# Patient Record
Sex: Male | Born: 1945 | Race: White | Hispanic: No | Marital: Married | Smoking: Former smoker
Health system: Southern US, Community
[De-identification: ages and names within clinical notes are randomized; demographics above are authoritative.]

## PROBLEM LIST (undated history)

## (undated) DIAGNOSIS — D649 Anemia, unspecified: Secondary | ICD-10-CM

## (undated) DIAGNOSIS — J869 Pyothorax without fistula: Secondary | ICD-10-CM

## (undated) DIAGNOSIS — I499 Cardiac arrhythmia, unspecified: Secondary | ICD-10-CM

## (undated) DIAGNOSIS — Z923 Personal history of irradiation: Secondary | ICD-10-CM

## (undated) DIAGNOSIS — G709 Myoneural disorder, unspecified: Secondary | ICD-10-CM

## (undated) DIAGNOSIS — E785 Hyperlipidemia, unspecified: Secondary | ICD-10-CM

## (undated) DIAGNOSIS — J449 Chronic obstructive pulmonary disease, unspecified: Secondary | ICD-10-CM

## (undated) DIAGNOSIS — R112 Nausea with vomiting, unspecified: Secondary | ICD-10-CM

## (undated) DIAGNOSIS — I10 Essential (primary) hypertension: Secondary | ICD-10-CM

## (undated) DIAGNOSIS — C801 Malignant (primary) neoplasm, unspecified: Secondary | ICD-10-CM

## (undated) DIAGNOSIS — Z9889 Other specified postprocedural states: Secondary | ICD-10-CM

## (undated) DIAGNOSIS — A419 Sepsis, unspecified organism: Secondary | ICD-10-CM

## (undated) HISTORY — DX: Essential (primary) hypertension: I10

## (undated) HISTORY — PX: EYE SURGERY: SHX253

## (undated) HISTORY — DX: Chronic obstructive pulmonary disease, unspecified: J44.9

## (undated) HISTORY — PX: CATARACT EXTRACTION: SUR2

---

## 1988-06-05 HISTORY — PX: BACK SURGERY: SHX140

## 1993-06-05 HISTORY — PX: CERVICAL SPINE SURGERY: SHX589

## 2011-06-22 DIAGNOSIS — Z79899 Other long term (current) drug therapy: Secondary | ICD-10-CM | POA: Diagnosis not present

## 2011-07-19 DIAGNOSIS — L4050 Arthropathic psoriasis, unspecified: Secondary | ICD-10-CM | POA: Diagnosis not present

## 2011-09-19 DIAGNOSIS — E119 Type 2 diabetes mellitus without complications: Secondary | ICD-10-CM | POA: Diagnosis not present

## 2011-09-19 DIAGNOSIS — E781 Pure hyperglyceridemia: Secondary | ICD-10-CM | POA: Diagnosis not present

## 2011-09-19 DIAGNOSIS — I10 Essential (primary) hypertension: Secondary | ICD-10-CM | POA: Diagnosis not present

## 2011-09-28 DIAGNOSIS — E119 Type 2 diabetes mellitus without complications: Secondary | ICD-10-CM | POA: Diagnosis not present

## 2011-09-28 DIAGNOSIS — E781 Pure hyperglyceridemia: Secondary | ICD-10-CM | POA: Diagnosis not present

## 2011-09-28 DIAGNOSIS — E7800 Pure hypercholesterolemia, unspecified: Secondary | ICD-10-CM | POA: Diagnosis not present

## 2011-09-28 DIAGNOSIS — I10 Essential (primary) hypertension: Secondary | ICD-10-CM | POA: Diagnosis not present

## 2011-09-28 DIAGNOSIS — L408 Other psoriasis: Secondary | ICD-10-CM | POA: Diagnosis not present

## 2011-09-28 DIAGNOSIS — J438 Other emphysema: Secondary | ICD-10-CM | POA: Diagnosis not present

## 2011-09-28 DIAGNOSIS — L4050 Arthropathic psoriasis, unspecified: Secondary | ICD-10-CM | POA: Diagnosis not present

## 2011-09-29 DIAGNOSIS — J438 Other emphysema: Secondary | ICD-10-CM | POA: Diagnosis not present

## 2012-01-02 DIAGNOSIS — Z79899 Other long term (current) drug therapy: Secondary | ICD-10-CM | POA: Diagnosis not present

## 2012-01-16 DIAGNOSIS — L4050 Arthropathic psoriasis, unspecified: Secondary | ICD-10-CM | POA: Diagnosis not present

## 2012-01-22 DIAGNOSIS — Z85828 Personal history of other malignant neoplasm of skin: Secondary | ICD-10-CM | POA: Diagnosis not present

## 2012-01-22 DIAGNOSIS — L408 Other psoriasis: Secondary | ICD-10-CM | POA: Diagnosis not present

## 2012-01-22 DIAGNOSIS — L570 Actinic keratosis: Secondary | ICD-10-CM | POA: Diagnosis not present

## 2012-02-13 DIAGNOSIS — H5010 Unspecified exotropia: Secondary | ICD-10-CM | POA: Diagnosis not present

## 2012-02-13 DIAGNOSIS — H43819 Vitreous degeneration, unspecified eye: Secondary | ICD-10-CM | POA: Diagnosis not present

## 2012-02-13 DIAGNOSIS — H4320 Crystalline deposits in vitreous body, unspecified eye: Secondary | ICD-10-CM | POA: Diagnosis not present

## 2012-02-13 DIAGNOSIS — Z961 Presence of intraocular lens: Secondary | ICD-10-CM | POA: Diagnosis not present

## 2012-03-26 DIAGNOSIS — N400 Enlarged prostate without lower urinary tract symptoms: Secondary | ICD-10-CM | POA: Diagnosis not present

## 2012-03-26 DIAGNOSIS — E119 Type 2 diabetes mellitus without complications: Secondary | ICD-10-CM | POA: Diagnosis not present

## 2012-03-26 DIAGNOSIS — E781 Pure hyperglyceridemia: Secondary | ICD-10-CM | POA: Diagnosis not present

## 2012-03-26 DIAGNOSIS — I10 Essential (primary) hypertension: Secondary | ICD-10-CM | POA: Diagnosis not present

## 2012-04-01 DIAGNOSIS — L4050 Arthropathic psoriasis, unspecified: Secondary | ICD-10-CM | POA: Diagnosis not present

## 2012-04-01 DIAGNOSIS — L408 Other psoriasis: Secondary | ICD-10-CM | POA: Diagnosis not present

## 2012-04-01 DIAGNOSIS — Z23 Encounter for immunization: Secondary | ICD-10-CM | POA: Diagnosis not present

## 2012-04-01 DIAGNOSIS — J438 Other emphysema: Secondary | ICD-10-CM | POA: Diagnosis not present

## 2012-04-01 DIAGNOSIS — E7800 Pure hypercholesterolemia, unspecified: Secondary | ICD-10-CM | POA: Diagnosis not present

## 2012-04-01 DIAGNOSIS — E781 Pure hyperglyceridemia: Secondary | ICD-10-CM | POA: Diagnosis not present

## 2012-04-01 DIAGNOSIS — I10 Essential (primary) hypertension: Secondary | ICD-10-CM | POA: Diagnosis not present

## 2012-04-01 DIAGNOSIS — E119 Type 2 diabetes mellitus without complications: Secondary | ICD-10-CM | POA: Diagnosis not present

## 2012-06-25 DIAGNOSIS — E119 Type 2 diabetes mellitus without complications: Secondary | ICD-10-CM | POA: Diagnosis not present

## 2012-06-25 DIAGNOSIS — I10 Essential (primary) hypertension: Secondary | ICD-10-CM | POA: Diagnosis not present

## 2012-06-25 DIAGNOSIS — E7800 Pure hypercholesterolemia, unspecified: Secondary | ICD-10-CM | POA: Diagnosis not present

## 2012-07-24 DIAGNOSIS — L570 Actinic keratosis: Secondary | ICD-10-CM | POA: Diagnosis not present

## 2012-07-24 DIAGNOSIS — L408 Other psoriasis: Secondary | ICD-10-CM | POA: Diagnosis not present

## 2012-07-24 DIAGNOSIS — Z85828 Personal history of other malignant neoplasm of skin: Secondary | ICD-10-CM | POA: Diagnosis not present

## 2012-07-25 DIAGNOSIS — L4050 Arthropathic psoriasis, unspecified: Secondary | ICD-10-CM | POA: Diagnosis not present

## 2012-09-25 DIAGNOSIS — I10 Essential (primary) hypertension: Secondary | ICD-10-CM | POA: Diagnosis not present

## 2012-09-25 DIAGNOSIS — E7800 Pure hypercholesterolemia, unspecified: Secondary | ICD-10-CM | POA: Diagnosis not present

## 2012-09-25 DIAGNOSIS — E119 Type 2 diabetes mellitus without complications: Secondary | ICD-10-CM | POA: Diagnosis not present

## 2012-10-01 DIAGNOSIS — I10 Essential (primary) hypertension: Secondary | ICD-10-CM | POA: Diagnosis not present

## 2012-10-01 DIAGNOSIS — L408 Other psoriasis: Secondary | ICD-10-CM | POA: Diagnosis not present

## 2012-10-01 DIAGNOSIS — E781 Pure hyperglyceridemia: Secondary | ICD-10-CM | POA: Diagnosis not present

## 2012-10-01 DIAGNOSIS — L4050 Arthropathic psoriasis, unspecified: Secondary | ICD-10-CM | POA: Diagnosis not present

## 2012-10-01 DIAGNOSIS — E119 Type 2 diabetes mellitus without complications: Secondary | ICD-10-CM | POA: Diagnosis not present

## 2012-10-01 DIAGNOSIS — E7800 Pure hypercholesterolemia, unspecified: Secondary | ICD-10-CM | POA: Diagnosis not present

## 2012-10-01 DIAGNOSIS — J438 Other emphysema: Secondary | ICD-10-CM | POA: Diagnosis not present

## 2012-12-30 DIAGNOSIS — E7800 Pure hypercholesterolemia, unspecified: Secondary | ICD-10-CM | POA: Diagnosis not present

## 2012-12-30 DIAGNOSIS — E119 Type 2 diabetes mellitus without complications: Secondary | ICD-10-CM | POA: Diagnosis not present

## 2012-12-30 DIAGNOSIS — L4050 Arthropathic psoriasis, unspecified: Secondary | ICD-10-CM | POA: Diagnosis not present

## 2012-12-30 DIAGNOSIS — Z8739 Personal history of other diseases of the musculoskeletal system and connective tissue: Secondary | ICD-10-CM | POA: Diagnosis not present

## 2012-12-30 DIAGNOSIS — I10 Essential (primary) hypertension: Secondary | ICD-10-CM | POA: Diagnosis not present

## 2012-12-30 DIAGNOSIS — J438 Other emphysema: Secondary | ICD-10-CM | POA: Diagnosis not present

## 2013-01-16 DIAGNOSIS — L4050 Arthropathic psoriasis, unspecified: Secondary | ICD-10-CM | POA: Diagnosis not present

## 2013-03-28 DIAGNOSIS — E119 Type 2 diabetes mellitus without complications: Secondary | ICD-10-CM | POA: Diagnosis not present

## 2013-03-28 DIAGNOSIS — I10 Essential (primary) hypertension: Secondary | ICD-10-CM | POA: Diagnosis not present

## 2013-03-28 DIAGNOSIS — E7800 Pure hypercholesterolemia, unspecified: Secondary | ICD-10-CM | POA: Diagnosis not present

## 2013-03-28 DIAGNOSIS — J438 Other emphysema: Secondary | ICD-10-CM | POA: Diagnosis not present

## 2013-04-07 DIAGNOSIS — E781 Pure hyperglyceridemia: Secondary | ICD-10-CM | POA: Diagnosis not present

## 2013-04-07 DIAGNOSIS — Z23 Encounter for immunization: Secondary | ICD-10-CM | POA: Diagnosis not present

## 2013-04-07 DIAGNOSIS — E119 Type 2 diabetes mellitus without complications: Secondary | ICD-10-CM | POA: Diagnosis not present

## 2013-04-07 DIAGNOSIS — J438 Other emphysema: Secondary | ICD-10-CM | POA: Diagnosis not present

## 2013-04-07 DIAGNOSIS — E7800 Pure hypercholesterolemia, unspecified: Secondary | ICD-10-CM | POA: Diagnosis not present

## 2013-04-07 DIAGNOSIS — I10 Essential (primary) hypertension: Secondary | ICD-10-CM | POA: Diagnosis not present

## 2013-04-07 DIAGNOSIS — L4050 Arthropathic psoriasis, unspecified: Secondary | ICD-10-CM | POA: Diagnosis not present

## 2013-04-07 DIAGNOSIS — L408 Other psoriasis: Secondary | ICD-10-CM | POA: Diagnosis not present

## 2013-06-05 DIAGNOSIS — L4050 Arthropathic psoriasis, unspecified: Secondary | ICD-10-CM

## 2013-06-05 HISTORY — DX: Arthropathic psoriasis, unspecified: L40.50

## 2013-06-30 DIAGNOSIS — J438 Other emphysema: Secondary | ICD-10-CM | POA: Diagnosis not present

## 2013-06-30 DIAGNOSIS — E7800 Pure hypercholesterolemia, unspecified: Secondary | ICD-10-CM | POA: Diagnosis not present

## 2013-06-30 DIAGNOSIS — I10 Essential (primary) hypertension: Secondary | ICD-10-CM | POA: Diagnosis not present

## 2013-06-30 DIAGNOSIS — E119 Type 2 diabetes mellitus without complications: Secondary | ICD-10-CM | POA: Diagnosis not present

## 2013-07-17 DIAGNOSIS — L4050 Arthropathic psoriasis, unspecified: Secondary | ICD-10-CM | POA: Diagnosis not present

## 2013-08-06 DIAGNOSIS — I10 Essential (primary) hypertension: Secondary | ICD-10-CM | POA: Diagnosis not present

## 2013-08-06 DIAGNOSIS — E7800 Pure hypercholesterolemia, unspecified: Secondary | ICD-10-CM | POA: Diagnosis not present

## 2013-08-06 DIAGNOSIS — E119 Type 2 diabetes mellitus without complications: Secondary | ICD-10-CM | POA: Diagnosis not present

## 2013-08-22 DIAGNOSIS — L408 Other psoriasis: Secondary | ICD-10-CM | POA: Diagnosis not present

## 2013-08-22 DIAGNOSIS — E119 Type 2 diabetes mellitus without complications: Secondary | ICD-10-CM | POA: Diagnosis not present

## 2013-08-22 DIAGNOSIS — L4050 Arthropathic psoriasis, unspecified: Secondary | ICD-10-CM | POA: Diagnosis not present

## 2013-08-22 DIAGNOSIS — I10 Essential (primary) hypertension: Secondary | ICD-10-CM | POA: Diagnosis not present

## 2013-08-22 DIAGNOSIS — Z23 Encounter for immunization: Secondary | ICD-10-CM | POA: Diagnosis not present

## 2013-08-22 DIAGNOSIS — J438 Other emphysema: Secondary | ICD-10-CM | POA: Diagnosis not present

## 2013-08-22 DIAGNOSIS — E781 Pure hyperglyceridemia: Secondary | ICD-10-CM | POA: Diagnosis not present

## 2013-08-22 DIAGNOSIS — E7800 Pure hypercholesterolemia, unspecified: Secondary | ICD-10-CM | POA: Diagnosis not present

## 2013-11-28 DIAGNOSIS — H524 Presbyopia: Secondary | ICD-10-CM | POA: Diagnosis not present

## 2013-11-28 DIAGNOSIS — Z961 Presence of intraocular lens: Secondary | ICD-10-CM | POA: Diagnosis not present

## 2013-12-09 DIAGNOSIS — J019 Acute sinusitis, unspecified: Secondary | ICD-10-CM | POA: Diagnosis not present

## 2013-12-22 DIAGNOSIS — E7800 Pure hypercholesterolemia, unspecified: Secondary | ICD-10-CM | POA: Diagnosis not present

## 2013-12-22 DIAGNOSIS — I10 Essential (primary) hypertension: Secondary | ICD-10-CM | POA: Diagnosis not present

## 2013-12-22 DIAGNOSIS — E119 Type 2 diabetes mellitus without complications: Secondary | ICD-10-CM | POA: Diagnosis not present

## 2014-01-14 DIAGNOSIS — L4050 Arthropathic psoriasis, unspecified: Secondary | ICD-10-CM | POA: Diagnosis not present

## 2014-03-06 DIAGNOSIS — R6884 Jaw pain: Secondary | ICD-10-CM | POA: Diagnosis not present

## 2014-03-06 DIAGNOSIS — K047 Periapical abscess without sinus: Secondary | ICD-10-CM | POA: Diagnosis not present

## 2014-03-09 DIAGNOSIS — E780 Pure hypercholesterolemia: Secondary | ICD-10-CM | POA: Diagnosis not present

## 2014-03-09 DIAGNOSIS — E119 Type 2 diabetes mellitus without complications: Secondary | ICD-10-CM | POA: Diagnosis not present

## 2014-03-09 DIAGNOSIS — I10 Essential (primary) hypertension: Secondary | ICD-10-CM | POA: Diagnosis not present

## 2014-03-13 DIAGNOSIS — E119 Type 2 diabetes mellitus without complications: Secondary | ICD-10-CM | POA: Diagnosis not present

## 2014-03-13 DIAGNOSIS — L4050 Arthropathic psoriasis, unspecified: Secondary | ICD-10-CM | POA: Diagnosis not present

## 2014-03-13 DIAGNOSIS — Z23 Encounter for immunization: Secondary | ICD-10-CM | POA: Diagnosis not present

## 2014-03-13 DIAGNOSIS — I10 Essential (primary) hypertension: Secondary | ICD-10-CM | POA: Diagnosis not present

## 2014-03-13 DIAGNOSIS — E782 Mixed hyperlipidemia: Secondary | ICD-10-CM | POA: Diagnosis not present

## 2014-03-13 DIAGNOSIS — J439 Emphysema, unspecified: Secondary | ICD-10-CM | POA: Diagnosis not present

## 2014-03-13 DIAGNOSIS — L408 Other psoriasis: Secondary | ICD-10-CM | POA: Diagnosis not present

## 2014-07-06 DIAGNOSIS — E119 Type 2 diabetes mellitus without complications: Secondary | ICD-10-CM | POA: Diagnosis not present

## 2014-07-06 DIAGNOSIS — E782 Mixed hyperlipidemia: Secondary | ICD-10-CM | POA: Diagnosis not present

## 2014-07-06 DIAGNOSIS — I10 Essential (primary) hypertension: Secondary | ICD-10-CM | POA: Diagnosis not present

## 2014-07-13 DIAGNOSIS — L4050 Arthropathic psoriasis, unspecified: Secondary | ICD-10-CM | POA: Diagnosis not present

## 2014-07-13 DIAGNOSIS — J439 Emphysema, unspecified: Secondary | ICD-10-CM | POA: Diagnosis not present

## 2014-07-13 DIAGNOSIS — E782 Mixed hyperlipidemia: Secondary | ICD-10-CM | POA: Diagnosis not present

## 2014-07-13 DIAGNOSIS — I10 Essential (primary) hypertension: Secondary | ICD-10-CM | POA: Diagnosis not present

## 2014-07-13 DIAGNOSIS — L408 Other psoriasis: Secondary | ICD-10-CM | POA: Diagnosis not present

## 2014-07-13 DIAGNOSIS — G4701 Insomnia due to medical condition: Secondary | ICD-10-CM | POA: Diagnosis not present

## 2014-07-13 DIAGNOSIS — E119 Type 2 diabetes mellitus without complications: Secondary | ICD-10-CM | POA: Diagnosis not present

## 2014-07-13 DIAGNOSIS — Z1389 Encounter for screening for other disorder: Secondary | ICD-10-CM | POA: Diagnosis not present

## 2014-07-24 DIAGNOSIS — L4050 Arthropathic psoriasis, unspecified: Secondary | ICD-10-CM | POA: Diagnosis not present

## 2014-07-24 DIAGNOSIS — R21 Rash and other nonspecific skin eruption: Secondary | ICD-10-CM | POA: Diagnosis not present

## 2014-07-24 DIAGNOSIS — M542 Cervicalgia: Secondary | ICD-10-CM | POA: Diagnosis not present

## 2014-08-05 DIAGNOSIS — L719 Rosacea, unspecified: Secondary | ICD-10-CM | POA: Diagnosis not present

## 2014-08-05 DIAGNOSIS — Z85828 Personal history of other malignant neoplasm of skin: Secondary | ICD-10-CM | POA: Diagnosis not present

## 2014-09-08 DIAGNOSIS — L719 Rosacea, unspecified: Secondary | ICD-10-CM | POA: Diagnosis not present

## 2014-11-17 DIAGNOSIS — E782 Mixed hyperlipidemia: Secondary | ICD-10-CM | POA: Diagnosis not present

## 2014-11-17 DIAGNOSIS — E119 Type 2 diabetes mellitus without complications: Secondary | ICD-10-CM | POA: Diagnosis not present

## 2014-11-17 DIAGNOSIS — I10 Essential (primary) hypertension: Secondary | ICD-10-CM | POA: Diagnosis not present

## 2014-11-24 DIAGNOSIS — E782 Mixed hyperlipidemia: Secondary | ICD-10-CM | POA: Diagnosis not present

## 2014-11-24 DIAGNOSIS — E119 Type 2 diabetes mellitus without complications: Secondary | ICD-10-CM | POA: Diagnosis not present

## 2014-11-24 DIAGNOSIS — G4701 Insomnia due to medical condition: Secondary | ICD-10-CM | POA: Diagnosis not present

## 2014-11-24 DIAGNOSIS — I10 Essential (primary) hypertension: Secondary | ICD-10-CM | POA: Diagnosis not present

## 2014-11-24 DIAGNOSIS — J439 Emphysema, unspecified: Secondary | ICD-10-CM | POA: Diagnosis not present

## 2014-11-24 DIAGNOSIS — L408 Other psoriasis: Secondary | ICD-10-CM | POA: Diagnosis not present

## 2014-11-24 DIAGNOSIS — L4050 Arthropathic psoriasis, unspecified: Secondary | ICD-10-CM | POA: Diagnosis not present

## 2014-12-16 DIAGNOSIS — Z85828 Personal history of other malignant neoplasm of skin: Secondary | ICD-10-CM | POA: Diagnosis not present

## 2014-12-16 DIAGNOSIS — L400 Psoriasis vulgaris: Secondary | ICD-10-CM | POA: Diagnosis not present

## 2014-12-16 DIAGNOSIS — L570 Actinic keratosis: Secondary | ICD-10-CM | POA: Diagnosis not present

## 2015-01-05 DIAGNOSIS — I10 Essential (primary) hypertension: Secondary | ICD-10-CM | POA: Diagnosis not present

## 2015-01-05 DIAGNOSIS — L4050 Arthropathic psoriasis, unspecified: Secondary | ICD-10-CM | POA: Diagnosis not present

## 2015-01-05 DIAGNOSIS — E119 Type 2 diabetes mellitus without complications: Secondary | ICD-10-CM | POA: Diagnosis not present

## 2015-01-05 DIAGNOSIS — L408 Other psoriasis: Secondary | ICD-10-CM | POA: Diagnosis not present

## 2015-02-12 DIAGNOSIS — M19041 Primary osteoarthritis, right hand: Secondary | ICD-10-CM | POA: Diagnosis not present

## 2015-02-12 DIAGNOSIS — Z79899 Other long term (current) drug therapy: Secondary | ICD-10-CM | POA: Diagnosis not present

## 2015-02-12 DIAGNOSIS — M19042 Primary osteoarthritis, left hand: Secondary | ICD-10-CM | POA: Diagnosis not present

## 2015-02-12 DIAGNOSIS — L4050 Arthropathic psoriasis, unspecified: Secondary | ICD-10-CM | POA: Diagnosis not present

## 2015-02-12 DIAGNOSIS — M542 Cervicalgia: Secondary | ICD-10-CM | POA: Diagnosis not present

## 2015-03-18 DIAGNOSIS — E119 Type 2 diabetes mellitus without complications: Secondary | ICD-10-CM | POA: Diagnosis not present

## 2015-03-18 DIAGNOSIS — I10 Essential (primary) hypertension: Secondary | ICD-10-CM | POA: Diagnosis not present

## 2015-03-18 DIAGNOSIS — E782 Mixed hyperlipidemia: Secondary | ICD-10-CM | POA: Diagnosis not present

## 2015-03-19 DIAGNOSIS — E782 Mixed hyperlipidemia: Secondary | ICD-10-CM | POA: Diagnosis not present

## 2015-03-24 DIAGNOSIS — Z23 Encounter for immunization: Secondary | ICD-10-CM | POA: Diagnosis not present

## 2015-03-24 DIAGNOSIS — E119 Type 2 diabetes mellitus without complications: Secondary | ICD-10-CM | POA: Diagnosis not present

## 2015-03-24 DIAGNOSIS — L408 Other psoriasis: Secondary | ICD-10-CM | POA: Diagnosis not present

## 2015-03-24 DIAGNOSIS — J439 Emphysema, unspecified: Secondary | ICD-10-CM | POA: Diagnosis not present

## 2015-03-24 DIAGNOSIS — G4701 Insomnia due to medical condition: Secondary | ICD-10-CM | POA: Diagnosis not present

## 2015-03-24 DIAGNOSIS — I10 Essential (primary) hypertension: Secondary | ICD-10-CM | POA: Diagnosis not present

## 2015-03-24 DIAGNOSIS — E782 Mixed hyperlipidemia: Secondary | ICD-10-CM | POA: Diagnosis not present

## 2015-03-24 DIAGNOSIS — L4050 Arthropathic psoriasis, unspecified: Secondary | ICD-10-CM | POA: Diagnosis not present

## 2015-05-04 DIAGNOSIS — E782 Mixed hyperlipidemia: Secondary | ICD-10-CM | POA: Diagnosis not present

## 2015-05-04 DIAGNOSIS — I10 Essential (primary) hypertension: Secondary | ICD-10-CM | POA: Diagnosis not present

## 2015-05-04 DIAGNOSIS — E119 Type 2 diabetes mellitus without complications: Secondary | ICD-10-CM | POA: Diagnosis not present

## 2015-05-14 DIAGNOSIS — M542 Cervicalgia: Secondary | ICD-10-CM | POA: Diagnosis not present

## 2015-05-14 DIAGNOSIS — R21 Rash and other nonspecific skin eruption: Secondary | ICD-10-CM | POA: Diagnosis not present

## 2015-05-14 DIAGNOSIS — L4050 Arthropathic psoriasis, unspecified: Secondary | ICD-10-CM | POA: Diagnosis not present

## 2015-06-16 DIAGNOSIS — H524 Presbyopia: Secondary | ICD-10-CM | POA: Diagnosis not present

## 2015-06-16 DIAGNOSIS — E119 Type 2 diabetes mellitus without complications: Secondary | ICD-10-CM | POA: Diagnosis not present

## 2015-06-16 DIAGNOSIS — I10 Essential (primary) hypertension: Secondary | ICD-10-CM | POA: Diagnosis not present

## 2015-06-22 DIAGNOSIS — Z85828 Personal history of other malignant neoplasm of skin: Secondary | ICD-10-CM | POA: Diagnosis not present

## 2015-06-22 DIAGNOSIS — L400 Psoriasis vulgaris: Secondary | ICD-10-CM | POA: Diagnosis not present

## 2015-06-22 DIAGNOSIS — L570 Actinic keratosis: Secondary | ICD-10-CM | POA: Diagnosis not present

## 2015-07-23 DIAGNOSIS — E782 Mixed hyperlipidemia: Secondary | ICD-10-CM | POA: Diagnosis not present

## 2015-07-23 DIAGNOSIS — E119 Type 2 diabetes mellitus without complications: Secondary | ICD-10-CM | POA: Diagnosis not present

## 2015-07-23 DIAGNOSIS — I10 Essential (primary) hypertension: Secondary | ICD-10-CM | POA: Diagnosis not present

## 2015-07-23 DIAGNOSIS — L4050 Arthropathic psoriasis, unspecified: Secondary | ICD-10-CM | POA: Diagnosis not present

## 2015-07-23 DIAGNOSIS — E7800 Pure hypercholesterolemia, unspecified: Secondary | ICD-10-CM | POA: Diagnosis not present

## 2015-07-30 DIAGNOSIS — G4701 Insomnia due to medical condition: Secondary | ICD-10-CM | POA: Diagnosis not present

## 2015-07-30 DIAGNOSIS — E119 Type 2 diabetes mellitus without complications: Secondary | ICD-10-CM | POA: Diagnosis not present

## 2015-07-30 DIAGNOSIS — L4050 Arthropathic psoriasis, unspecified: Secondary | ICD-10-CM | POA: Diagnosis not present

## 2015-07-30 DIAGNOSIS — J439 Emphysema, unspecified: Secondary | ICD-10-CM | POA: Diagnosis not present

## 2015-07-30 DIAGNOSIS — I10 Essential (primary) hypertension: Secondary | ICD-10-CM | POA: Diagnosis not present

## 2015-07-30 DIAGNOSIS — L408 Other psoriasis: Secondary | ICD-10-CM | POA: Diagnosis not present

## 2015-07-30 DIAGNOSIS — E782 Mixed hyperlipidemia: Secondary | ICD-10-CM | POA: Diagnosis not present

## 2015-11-10 DIAGNOSIS — E782 Mixed hyperlipidemia: Secondary | ICD-10-CM | POA: Diagnosis not present

## 2015-11-10 DIAGNOSIS — I10 Essential (primary) hypertension: Secondary | ICD-10-CM | POA: Diagnosis not present

## 2015-11-10 DIAGNOSIS — E7800 Pure hypercholesterolemia, unspecified: Secondary | ICD-10-CM | POA: Diagnosis not present

## 2015-11-10 DIAGNOSIS — E119 Type 2 diabetes mellitus without complications: Secondary | ICD-10-CM | POA: Diagnosis not present

## 2015-11-12 DIAGNOSIS — Z79899 Other long term (current) drug therapy: Secondary | ICD-10-CM | POA: Diagnosis not present

## 2015-11-12 DIAGNOSIS — M542 Cervicalgia: Secondary | ICD-10-CM | POA: Diagnosis not present

## 2015-11-12 DIAGNOSIS — L4050 Arthropathic psoriasis, unspecified: Secondary | ICD-10-CM | POA: Diagnosis not present

## 2015-11-12 DIAGNOSIS — R21 Rash and other nonspecific skin eruption: Secondary | ICD-10-CM | POA: Diagnosis not present

## 2015-11-15 DIAGNOSIS — I10 Essential (primary) hypertension: Secondary | ICD-10-CM | POA: Diagnosis not present

## 2015-11-15 DIAGNOSIS — G4701 Insomnia due to medical condition: Secondary | ICD-10-CM | POA: Diagnosis not present

## 2015-11-15 DIAGNOSIS — J439 Emphysema, unspecified: Secondary | ICD-10-CM | POA: Diagnosis not present

## 2015-11-15 DIAGNOSIS — L408 Other psoriasis: Secondary | ICD-10-CM | POA: Diagnosis not present

## 2015-11-15 DIAGNOSIS — L4050 Arthropathic psoriasis, unspecified: Secondary | ICD-10-CM | POA: Diagnosis not present

## 2015-11-15 DIAGNOSIS — E782 Mixed hyperlipidemia: Secondary | ICD-10-CM | POA: Diagnosis not present

## 2015-11-15 DIAGNOSIS — E1165 Type 2 diabetes mellitus with hyperglycemia: Secondary | ICD-10-CM | POA: Diagnosis not present

## 2015-12-21 DIAGNOSIS — L570 Actinic keratosis: Secondary | ICD-10-CM | POA: Diagnosis not present

## 2015-12-21 DIAGNOSIS — L719 Rosacea, unspecified: Secondary | ICD-10-CM | POA: Diagnosis not present

## 2015-12-21 DIAGNOSIS — Z85828 Personal history of other malignant neoplasm of skin: Secondary | ICD-10-CM | POA: Diagnosis not present

## 2015-12-21 DIAGNOSIS — L817 Pigmented purpuric dermatosis: Secondary | ICD-10-CM | POA: Diagnosis not present

## 2016-01-17 DIAGNOSIS — R21 Rash and other nonspecific skin eruption: Secondary | ICD-10-CM | POA: Diagnosis not present

## 2016-01-17 DIAGNOSIS — L299 Pruritus, unspecified: Secondary | ICD-10-CM | POA: Diagnosis not present

## 2016-01-17 DIAGNOSIS — Z6829 Body mass index (BMI) 29.0-29.9, adult: Secondary | ICD-10-CM | POA: Diagnosis not present

## 2016-01-24 DIAGNOSIS — L500 Allergic urticaria: Secondary | ICD-10-CM | POA: Diagnosis not present

## 2016-01-24 DIAGNOSIS — D485 Neoplasm of uncertain behavior of skin: Secondary | ICD-10-CM | POA: Diagnosis not present

## 2016-01-24 DIAGNOSIS — L509 Urticaria, unspecified: Secondary | ICD-10-CM | POA: Diagnosis not present

## 2016-01-31 ENCOUNTER — Other Ambulatory Visit: Payer: Self-pay

## 2016-03-16 DIAGNOSIS — J439 Emphysema, unspecified: Secondary | ICD-10-CM | POA: Diagnosis not present

## 2016-03-16 DIAGNOSIS — I10 Essential (primary) hypertension: Secondary | ICD-10-CM | POA: Diagnosis not present

## 2016-03-16 DIAGNOSIS — E7800 Pure hypercholesterolemia, unspecified: Secondary | ICD-10-CM | POA: Diagnosis not present

## 2016-03-16 DIAGNOSIS — L299 Pruritus, unspecified: Secondary | ICD-10-CM | POA: Diagnosis not present

## 2016-03-16 DIAGNOSIS — E782 Mixed hyperlipidemia: Secondary | ICD-10-CM | POA: Diagnosis not present

## 2016-03-16 DIAGNOSIS — E1165 Type 2 diabetes mellitus with hyperglycemia: Secondary | ICD-10-CM | POA: Diagnosis not present

## 2016-03-20 DIAGNOSIS — L408 Other psoriasis: Secondary | ICD-10-CM | POA: Diagnosis not present

## 2016-03-20 DIAGNOSIS — I10 Essential (primary) hypertension: Secondary | ICD-10-CM | POA: Diagnosis not present

## 2016-03-20 DIAGNOSIS — J439 Emphysema, unspecified: Secondary | ICD-10-CM | POA: Diagnosis not present

## 2016-03-20 DIAGNOSIS — L4050 Arthropathic psoriasis, unspecified: Secondary | ICD-10-CM | POA: Diagnosis not present

## 2016-03-20 DIAGNOSIS — Z6829 Body mass index (BMI) 29.0-29.9, adult: Secondary | ICD-10-CM | POA: Diagnosis not present

## 2016-03-20 DIAGNOSIS — E1165 Type 2 diabetes mellitus with hyperglycemia: Secondary | ICD-10-CM | POA: Diagnosis not present

## 2016-03-20 DIAGNOSIS — E782 Mixed hyperlipidemia: Secondary | ICD-10-CM | POA: Diagnosis not present

## 2016-03-20 DIAGNOSIS — Z23 Encounter for immunization: Secondary | ICD-10-CM | POA: Diagnosis not present

## 2016-04-29 DIAGNOSIS — J069 Acute upper respiratory infection, unspecified: Secondary | ICD-10-CM | POA: Diagnosis not present

## 2016-06-02 DIAGNOSIS — Z79899 Other long term (current) drug therapy: Secondary | ICD-10-CM | POA: Diagnosis not present

## 2016-06-02 DIAGNOSIS — R21 Rash and other nonspecific skin eruption: Secondary | ICD-10-CM | POA: Diagnosis not present

## 2016-06-02 DIAGNOSIS — M542 Cervicalgia: Secondary | ICD-10-CM | POA: Diagnosis not present

## 2016-06-02 DIAGNOSIS — L4050 Arthropathic psoriasis, unspecified: Secondary | ICD-10-CM | POA: Diagnosis not present

## 2016-06-05 DIAGNOSIS — E119 Type 2 diabetes mellitus without complications: Secondary | ICD-10-CM

## 2016-06-05 HISTORY — DX: Type 2 diabetes mellitus without complications: E11.9

## 2016-06-16 DIAGNOSIS — J209 Acute bronchitis, unspecified: Secondary | ICD-10-CM | POA: Diagnosis not present

## 2016-06-16 DIAGNOSIS — Z6829 Body mass index (BMI) 29.0-29.9, adult: Secondary | ICD-10-CM | POA: Diagnosis not present

## 2016-06-16 DIAGNOSIS — J439 Emphysema, unspecified: Secondary | ICD-10-CM | POA: Diagnosis not present

## 2016-06-20 DIAGNOSIS — L570 Actinic keratosis: Secondary | ICD-10-CM | POA: Diagnosis not present

## 2016-06-20 DIAGNOSIS — L400 Psoriasis vulgaris: Secondary | ICD-10-CM | POA: Diagnosis not present

## 2016-06-20 DIAGNOSIS — Z85828 Personal history of other malignant neoplasm of skin: Secondary | ICD-10-CM | POA: Diagnosis not present

## 2016-07-18 DIAGNOSIS — E782 Mixed hyperlipidemia: Secondary | ICD-10-CM | POA: Diagnosis not present

## 2016-07-18 DIAGNOSIS — E1165 Type 2 diabetes mellitus with hyperglycemia: Secondary | ICD-10-CM | POA: Diagnosis not present

## 2016-07-18 DIAGNOSIS — I10 Essential (primary) hypertension: Secondary | ICD-10-CM | POA: Diagnosis not present

## 2016-07-18 DIAGNOSIS — E7800 Pure hypercholesterolemia, unspecified: Secondary | ICD-10-CM | POA: Diagnosis not present

## 2016-07-18 DIAGNOSIS — J439 Emphysema, unspecified: Secondary | ICD-10-CM | POA: Diagnosis not present

## 2016-07-21 DIAGNOSIS — Z6828 Body mass index (BMI) 28.0-28.9, adult: Secondary | ICD-10-CM | POA: Diagnosis not present

## 2016-07-21 DIAGNOSIS — I10 Essential (primary) hypertension: Secondary | ICD-10-CM | POA: Diagnosis not present

## 2016-07-21 DIAGNOSIS — L4050 Arthropathic psoriasis, unspecified: Secondary | ICD-10-CM | POA: Diagnosis not present

## 2016-07-21 DIAGNOSIS — E782 Mixed hyperlipidemia: Secondary | ICD-10-CM | POA: Diagnosis not present

## 2016-07-21 DIAGNOSIS — L408 Other psoriasis: Secondary | ICD-10-CM | POA: Diagnosis not present

## 2016-07-21 DIAGNOSIS — G4701 Insomnia due to medical condition: Secondary | ICD-10-CM | POA: Diagnosis not present

## 2016-07-21 DIAGNOSIS — J439 Emphysema, unspecified: Secondary | ICD-10-CM | POA: Diagnosis not present

## 2016-07-21 DIAGNOSIS — E1165 Type 2 diabetes mellitus with hyperglycemia: Secondary | ICD-10-CM | POA: Diagnosis not present

## 2016-11-21 DIAGNOSIS — L718 Other rosacea: Secondary | ICD-10-CM | POA: Diagnosis not present

## 2016-11-21 DIAGNOSIS — Z7984 Long term (current) use of oral hypoglycemic drugs: Secondary | ICD-10-CM | POA: Diagnosis not present

## 2016-11-21 DIAGNOSIS — E119 Type 2 diabetes mellitus without complications: Secondary | ICD-10-CM | POA: Diagnosis not present

## 2016-11-21 DIAGNOSIS — H4323 Crystalline deposits in vitreous body, bilateral: Secondary | ICD-10-CM | POA: Diagnosis not present

## 2016-11-27 DIAGNOSIS — M542 Cervicalgia: Secondary | ICD-10-CM | POA: Diagnosis not present

## 2016-11-27 DIAGNOSIS — R21 Rash and other nonspecific skin eruption: Secondary | ICD-10-CM | POA: Diagnosis not present

## 2016-11-27 DIAGNOSIS — L4050 Arthropathic psoriasis, unspecified: Secondary | ICD-10-CM | POA: Diagnosis not present

## 2016-11-27 DIAGNOSIS — Z79899 Other long term (current) drug therapy: Secondary | ICD-10-CM | POA: Diagnosis not present

## 2016-12-04 DIAGNOSIS — E782 Mixed hyperlipidemia: Secondary | ICD-10-CM | POA: Diagnosis not present

## 2016-12-04 DIAGNOSIS — E1165 Type 2 diabetes mellitus with hyperglycemia: Secondary | ICD-10-CM | POA: Diagnosis not present

## 2016-12-04 DIAGNOSIS — E7800 Pure hypercholesterolemia, unspecified: Secondary | ICD-10-CM | POA: Diagnosis not present

## 2016-12-04 DIAGNOSIS — Z6827 Body mass index (BMI) 27.0-27.9, adult: Secondary | ICD-10-CM | POA: Diagnosis not present

## 2016-12-04 DIAGNOSIS — J439 Emphysema, unspecified: Secondary | ICD-10-CM | POA: Diagnosis not present

## 2016-12-04 DIAGNOSIS — I10 Essential (primary) hypertension: Secondary | ICD-10-CM | POA: Diagnosis not present

## 2016-12-04 DIAGNOSIS — L4050 Arthropathic psoriasis, unspecified: Secondary | ICD-10-CM | POA: Diagnosis not present

## 2016-12-04 DIAGNOSIS — Z0000 Encounter for general adult medical examination without abnormal findings: Secondary | ICD-10-CM | POA: Diagnosis not present

## 2016-12-07 DIAGNOSIS — J439 Emphysema, unspecified: Secondary | ICD-10-CM | POA: Diagnosis not present

## 2016-12-07 DIAGNOSIS — I10 Essential (primary) hypertension: Secondary | ICD-10-CM | POA: Diagnosis not present

## 2016-12-07 DIAGNOSIS — G4701 Insomnia due to medical condition: Secondary | ICD-10-CM | POA: Diagnosis not present

## 2016-12-07 DIAGNOSIS — E782 Mixed hyperlipidemia: Secondary | ICD-10-CM | POA: Diagnosis not present

## 2016-12-07 DIAGNOSIS — L408 Other psoriasis: Secondary | ICD-10-CM | POA: Diagnosis not present

## 2016-12-07 DIAGNOSIS — L4050 Arthropathic psoriasis, unspecified: Secondary | ICD-10-CM | POA: Diagnosis not present

## 2016-12-07 DIAGNOSIS — E1165 Type 2 diabetes mellitus with hyperglycemia: Secondary | ICD-10-CM | POA: Diagnosis not present

## 2016-12-07 DIAGNOSIS — Z6828 Body mass index (BMI) 28.0-28.9, adult: Secondary | ICD-10-CM | POA: Diagnosis not present

## 2016-12-18 DIAGNOSIS — L400 Psoriasis vulgaris: Secondary | ICD-10-CM | POA: Diagnosis not present

## 2016-12-18 DIAGNOSIS — L570 Actinic keratosis: Secondary | ICD-10-CM | POA: Diagnosis not present

## 2016-12-18 DIAGNOSIS — Z85828 Personal history of other malignant neoplasm of skin: Secondary | ICD-10-CM | POA: Diagnosis not present

## 2017-04-17 DIAGNOSIS — L4050 Arthropathic psoriasis, unspecified: Secondary | ICD-10-CM | POA: Diagnosis not present

## 2017-04-17 DIAGNOSIS — E7800 Pure hypercholesterolemia, unspecified: Secondary | ICD-10-CM | POA: Diagnosis not present

## 2017-04-17 DIAGNOSIS — E782 Mixed hyperlipidemia: Secondary | ICD-10-CM | POA: Diagnosis not present

## 2017-04-17 DIAGNOSIS — I10 Essential (primary) hypertension: Secondary | ICD-10-CM | POA: Diagnosis not present

## 2017-04-17 DIAGNOSIS — E1165 Type 2 diabetes mellitus with hyperglycemia: Secondary | ICD-10-CM | POA: Diagnosis not present

## 2017-04-19 DIAGNOSIS — E782 Mixed hyperlipidemia: Secondary | ICD-10-CM | POA: Diagnosis not present

## 2017-04-19 DIAGNOSIS — L4050 Arthropathic psoriasis, unspecified: Secondary | ICD-10-CM | POA: Diagnosis not present

## 2017-04-19 DIAGNOSIS — E1165 Type 2 diabetes mellitus with hyperglycemia: Secondary | ICD-10-CM | POA: Diagnosis not present

## 2017-04-19 DIAGNOSIS — Z6828 Body mass index (BMI) 28.0-28.9, adult: Secondary | ICD-10-CM | POA: Diagnosis not present

## 2017-04-19 DIAGNOSIS — I10 Essential (primary) hypertension: Secondary | ICD-10-CM | POA: Diagnosis not present

## 2017-04-19 DIAGNOSIS — G4701 Insomnia due to medical condition: Secondary | ICD-10-CM | POA: Diagnosis not present

## 2017-04-19 DIAGNOSIS — Z23 Encounter for immunization: Secondary | ICD-10-CM | POA: Diagnosis not present

## 2017-04-19 DIAGNOSIS — J439 Emphysema, unspecified: Secondary | ICD-10-CM | POA: Diagnosis not present

## 2017-05-21 DIAGNOSIS — L723 Sebaceous cyst: Secondary | ICD-10-CM | POA: Diagnosis not present

## 2017-05-22 DIAGNOSIS — L4050 Arthropathic psoriasis, unspecified: Secondary | ICD-10-CM | POA: Diagnosis not present

## 2017-05-22 DIAGNOSIS — J449 Chronic obstructive pulmonary disease, unspecified: Secondary | ICD-10-CM | POA: Diagnosis not present

## 2017-05-22 DIAGNOSIS — Z79899 Other long term (current) drug therapy: Secondary | ICD-10-CM | POA: Diagnosis not present

## 2017-06-20 DIAGNOSIS — L400 Psoriasis vulgaris: Secondary | ICD-10-CM | POA: Diagnosis not present

## 2017-06-20 DIAGNOSIS — L570 Actinic keratosis: Secondary | ICD-10-CM | POA: Diagnosis not present

## 2017-06-20 DIAGNOSIS — Z85828 Personal history of other malignant neoplasm of skin: Secondary | ICD-10-CM | POA: Diagnosis not present

## 2017-07-05 DIAGNOSIS — L720 Epidermal cyst: Secondary | ICD-10-CM | POA: Diagnosis not present

## 2017-07-05 DIAGNOSIS — D485 Neoplasm of uncertain behavior of skin: Secondary | ICD-10-CM | POA: Diagnosis not present

## 2017-08-20 DIAGNOSIS — E782 Mixed hyperlipidemia: Secondary | ICD-10-CM | POA: Diagnosis not present

## 2017-08-20 DIAGNOSIS — I10 Essential (primary) hypertension: Secondary | ICD-10-CM | POA: Diagnosis not present

## 2017-08-20 DIAGNOSIS — E7800 Pure hypercholesterolemia, unspecified: Secondary | ICD-10-CM | POA: Diagnosis not present

## 2017-08-20 DIAGNOSIS — E1165 Type 2 diabetes mellitus with hyperglycemia: Secondary | ICD-10-CM | POA: Diagnosis not present

## 2017-08-24 DIAGNOSIS — L408 Other psoriasis: Secondary | ICD-10-CM | POA: Diagnosis not present

## 2017-08-24 DIAGNOSIS — J439 Emphysema, unspecified: Secondary | ICD-10-CM | POA: Diagnosis not present

## 2017-08-24 DIAGNOSIS — G4701 Insomnia due to medical condition: Secondary | ICD-10-CM | POA: Diagnosis not present

## 2017-08-24 DIAGNOSIS — Z6828 Body mass index (BMI) 28.0-28.9, adult: Secondary | ICD-10-CM | POA: Diagnosis not present

## 2017-08-24 DIAGNOSIS — L4050 Arthropathic psoriasis, unspecified: Secondary | ICD-10-CM | POA: Diagnosis not present

## 2017-08-24 DIAGNOSIS — E782 Mixed hyperlipidemia: Secondary | ICD-10-CM | POA: Diagnosis not present

## 2017-08-24 DIAGNOSIS — I10 Essential (primary) hypertension: Secondary | ICD-10-CM | POA: Diagnosis not present

## 2017-08-24 DIAGNOSIS — E1165 Type 2 diabetes mellitus with hyperglycemia: Secondary | ICD-10-CM | POA: Diagnosis not present

## 2017-08-30 DIAGNOSIS — L723 Sebaceous cyst: Secondary | ICD-10-CM | POA: Diagnosis not present

## 2017-08-30 DIAGNOSIS — L408 Other psoriasis: Secondary | ICD-10-CM | POA: Diagnosis not present

## 2017-08-30 DIAGNOSIS — I10 Essential (primary) hypertension: Secondary | ICD-10-CM | POA: Diagnosis not present

## 2017-10-23 DIAGNOSIS — Z6827 Body mass index (BMI) 27.0-27.9, adult: Secondary | ICD-10-CM | POA: Diagnosis not present

## 2017-10-23 DIAGNOSIS — J0101 Acute recurrent maxillary sinusitis: Secondary | ICD-10-CM | POA: Diagnosis not present

## 2017-10-23 DIAGNOSIS — W57XXXA Bitten or stung by nonvenomous insect and other nonvenomous arthropods, initial encounter: Secondary | ICD-10-CM | POA: Diagnosis not present

## 2017-10-31 DIAGNOSIS — L4050 Arthropathic psoriasis, unspecified: Secondary | ICD-10-CM | POA: Diagnosis not present

## 2017-10-31 DIAGNOSIS — Z79899 Other long term (current) drug therapy: Secondary | ICD-10-CM | POA: Diagnosis not present

## 2017-10-31 DIAGNOSIS — M79643 Pain in unspecified hand: Secondary | ICD-10-CM | POA: Diagnosis not present

## 2017-10-31 DIAGNOSIS — M1990 Unspecified osteoarthritis, unspecified site: Secondary | ICD-10-CM | POA: Diagnosis not present

## 2017-12-18 DIAGNOSIS — Z85828 Personal history of other malignant neoplasm of skin: Secondary | ICD-10-CM | POA: Diagnosis not present

## 2017-12-18 DIAGNOSIS — L719 Rosacea, unspecified: Secondary | ICD-10-CM | POA: Diagnosis not present

## 2017-12-18 DIAGNOSIS — L400 Psoriasis vulgaris: Secondary | ICD-10-CM | POA: Diagnosis not present

## 2017-12-18 DIAGNOSIS — L570 Actinic keratosis: Secondary | ICD-10-CM | POA: Diagnosis not present

## 2018-01-22 DIAGNOSIS — Z87891 Personal history of nicotine dependence: Secondary | ICD-10-CM | POA: Diagnosis not present

## 2018-01-22 DIAGNOSIS — M1990 Unspecified osteoarthritis, unspecified site: Secondary | ICD-10-CM | POA: Diagnosis not present

## 2018-01-22 DIAGNOSIS — M542 Cervicalgia: Secondary | ICD-10-CM | POA: Diagnosis not present

## 2018-01-22 DIAGNOSIS — Z79899 Other long term (current) drug therapy: Secondary | ICD-10-CM | POA: Diagnosis not present

## 2018-01-23 DIAGNOSIS — L4050 Arthropathic psoriasis, unspecified: Secondary | ICD-10-CM | POA: Diagnosis not present

## 2018-01-23 DIAGNOSIS — I10 Essential (primary) hypertension: Secondary | ICD-10-CM | POA: Diagnosis not present

## 2018-03-14 DIAGNOSIS — E1165 Type 2 diabetes mellitus with hyperglycemia: Secondary | ICD-10-CM | POA: Diagnosis not present

## 2018-03-14 DIAGNOSIS — L408 Other psoriasis: Secondary | ICD-10-CM | POA: Diagnosis not present

## 2018-03-14 DIAGNOSIS — Z23 Encounter for immunization: Secondary | ICD-10-CM | POA: Diagnosis not present

## 2018-03-14 DIAGNOSIS — I10 Essential (primary) hypertension: Secondary | ICD-10-CM | POA: Diagnosis not present

## 2018-03-14 DIAGNOSIS — L4050 Arthropathic psoriasis, unspecified: Secondary | ICD-10-CM | POA: Diagnosis not present

## 2018-03-14 DIAGNOSIS — J439 Emphysema, unspecified: Secondary | ICD-10-CM | POA: Diagnosis not present

## 2018-03-14 DIAGNOSIS — G4701 Insomnia due to medical condition: Secondary | ICD-10-CM | POA: Diagnosis not present

## 2018-03-14 DIAGNOSIS — E782 Mixed hyperlipidemia: Secondary | ICD-10-CM | POA: Diagnosis not present

## 2018-05-13 DIAGNOSIS — J441 Chronic obstructive pulmonary disease with (acute) exacerbation: Secondary | ICD-10-CM | POA: Diagnosis not present

## 2018-05-13 DIAGNOSIS — R0902 Hypoxemia: Secondary | ICD-10-CM | POA: Diagnosis not present

## 2018-05-13 DIAGNOSIS — Z6826 Body mass index (BMI) 26.0-26.9, adult: Secondary | ICD-10-CM | POA: Diagnosis not present

## 2018-05-22 DIAGNOSIS — L4050 Arthropathic psoriasis, unspecified: Secondary | ICD-10-CM | POA: Diagnosis not present

## 2018-05-22 DIAGNOSIS — Z79899 Other long term (current) drug therapy: Secondary | ICD-10-CM | POA: Diagnosis not present

## 2018-05-22 DIAGNOSIS — M79643 Pain in unspecified hand: Secondary | ICD-10-CM | POA: Diagnosis not present

## 2018-05-22 DIAGNOSIS — M1990 Unspecified osteoarthritis, unspecified site: Secondary | ICD-10-CM | POA: Diagnosis not present

## 2018-06-19 DIAGNOSIS — Z85828 Personal history of other malignant neoplasm of skin: Secondary | ICD-10-CM | POA: Diagnosis not present

## 2018-06-19 DIAGNOSIS — L570 Actinic keratosis: Secondary | ICD-10-CM | POA: Diagnosis not present

## 2018-06-19 DIAGNOSIS — L400 Psoriasis vulgaris: Secondary | ICD-10-CM | POA: Diagnosis not present

## 2018-08-12 DIAGNOSIS — Z79899 Other long term (current) drug therapy: Secondary | ICD-10-CM | POA: Diagnosis not present

## 2018-08-12 DIAGNOSIS — L4050 Arthropathic psoriasis, unspecified: Secondary | ICD-10-CM | POA: Diagnosis not present

## 2018-08-16 DIAGNOSIS — Z6825 Body mass index (BMI) 25.0-25.9, adult: Secondary | ICD-10-CM | POA: Diagnosis not present

## 2018-08-16 DIAGNOSIS — R0602 Shortness of breath: Secondary | ICD-10-CM | POA: Diagnosis not present

## 2018-08-16 DIAGNOSIS — R5383 Other fatigue: Secondary | ICD-10-CM | POA: Diagnosis not present

## 2018-08-16 DIAGNOSIS — I10 Essential (primary) hypertension: Secondary | ICD-10-CM | POA: Diagnosis not present

## 2018-08-16 DIAGNOSIS — J439 Emphysema, unspecified: Secondary | ICD-10-CM | POA: Diagnosis not present

## 2018-08-16 DIAGNOSIS — L4050 Arthropathic psoriasis, unspecified: Secondary | ICD-10-CM | POA: Diagnosis not present

## 2018-08-16 DIAGNOSIS — R634 Abnormal weight loss: Secondary | ICD-10-CM | POA: Diagnosis not present

## 2018-08-16 DIAGNOSIS — D519 Vitamin B12 deficiency anemia, unspecified: Secondary | ICD-10-CM | POA: Diagnosis not present

## 2018-08-16 DIAGNOSIS — D649 Anemia, unspecified: Secondary | ICD-10-CM | POA: Diagnosis not present

## 2018-08-16 DIAGNOSIS — Z1212 Encounter for screening for malignant neoplasm of rectum: Secondary | ICD-10-CM | POA: Diagnosis not present

## 2018-08-29 ENCOUNTER — Encounter: Payer: Self-pay | Admitting: Gastroenterology

## 2018-09-03 ENCOUNTER — Telehealth: Payer: Self-pay | Admitting: Gastroenterology

## 2018-11-06 ENCOUNTER — Other Ambulatory Visit: Payer: Self-pay | Admitting: *Deleted

## 2018-11-06 ENCOUNTER — Telehealth: Payer: Self-pay | Admitting: *Deleted

## 2018-11-06 ENCOUNTER — Ambulatory Visit (INDEPENDENT_AMBULATORY_CARE_PROVIDER_SITE_OTHER): Payer: Medicare Other | Admitting: Gastroenterology

## 2018-11-06 ENCOUNTER — Other Ambulatory Visit: Payer: Self-pay

## 2018-11-06 ENCOUNTER — Encounter: Payer: Self-pay | Admitting: Gastroenterology

## 2018-11-06 DIAGNOSIS — R195 Other fecal abnormalities: Secondary | ICD-10-CM | POA: Diagnosis not present

## 2018-11-06 MED ORDER — NA SULFATE-K SULFATE-MG SULF 17.5-3.13-1.6 GM/177ML PO SOLN: 1.0000 | ORAL | 0 refills | Status: DC

## 2018-11-06 NOTE — Telephone Encounter (Signed)
Pre-op appt and instructions mailed to patient

## 2018-11-06 NOTE — Assessment & Plan Note (Addendum)
NO BRBPR OR MELENA.  DRINK WATER TO KEEP YOUR URINE LIGHT YELLOW. FOLLOW A HIGH FIBER DIET. AVOID ITEMS THAT CAUSE BLOATING & GAS.  HANDOUT GIVEN. COMPLETE COLONOSCOPY AND UPPER ENDOSCOPY to SCREEN FOR BARRETT'S ESOPHAGUS IN 4-6 weeks. YOU MAY BRING THE ENEMA TO ADMINISTER IN THE PREOP AREA. DISCUSSED PROCEDURE, BENEFITS, & RISKS: < 1% chance of medication reaction, bleeding, perforation, ASPIRATION, or rupture of spleen/liver requiring surgery to fix it and missed polyps < 1 cm 10-20% of the time. FOLLOW UP IN THE OFFICE WILL BE SCHEDULED IF NEEDED AFTER ENDOSCOPY.

## 2018-11-06 NOTE — Progress Notes (Signed)
Subjective:    Patient ID: Jeremy [name], male    DOB: April 01, 1946, 73 y.o.   MRN: [id number]  [name], MD  HPI OCCASIONAL CONSTIPATED DUE TO PAIN MEDS FROM ARTHRITIS. BMs: 1X/DAY USUALLY. HAS TROUBLE STAYING ASLEEP. WEIGHT STABLE AT 194 LBS. CUT BACK ON EATING. APPETITE: GOOD.  PT DENIES FEVER, CHILLS, HEMATOCHEZIA, HEMATEMESIS, nausea, vomiting, melena, diarrhea, CHEST PAIN, SHORTNESS OF BREATH, CHANGE IN BOWEL IN HABITS, constipation, abdominal pain, problems swallowing, problems with sedation, OR heartburn or indigestion.  Past Medical History:  Diagnosis Date  . COPD (chronic obstructive pulmonary disease) (HCC)    QUIT SMOKING 30 YRS AGO  . Diabetes ([place]) 2018  . HTN (hypertension)   . Psoriatic arthritis ([place]) 2015   Past Surgical History:  Procedure Laterality Date  . BACK SURGERY  1990  . CERVICAL SPINE SURGERY  1995   Allergies  Allergen Reactions  . Aleve [Naproxen Sodium] Hives and Rash    Current Outpatient Medications  Medication Sig    . Ascorbic Acid (VITAMIN C) 1000 MG tablet Take 1,000 mg by mouth daily.    . cyclobenzaprine (FLEXERIL) 10 MG tablet Take 10 mg by mouth at bedtime.    Marland Kitchen doxycycline (ADOXA) 50 MG tablet Take 50 mg by mouth daily. For rosacea    . folic acid (FOLVITE) 1 MG tablet Take 1 mg by mouth daily.    Marland Kitchen lisinopril (ZESTRIL) 20 MG tablet Take 20 mg by mouth daily.    . metFORMIN (GLUCOPHAGE-XR) 500 MG 24 hr tablet Take 500 mg by mouth 2 (two) times daily.    . methotrexate 50 MG/2ML injection Inject 25 mg into the muscle once a week.    . metroNIDAZOLE (METROCREAM) 0.75 % cream Apply topically as needed.    . mometasone (ELOCON) 0.1 % cream Apply 1 application topically as needed.    . Omega-3 Fatty Acids (FISH OIL) 1000 MG CAPS Take 1 capsule by mouth daily.    Marland Kitchen oxyCODONE-acetaminophen (PERCOCET/ROXICET) 5-325 MG tablet Take 1 tablet by mouth every 4 (four) hours as needed for severe pain. FOR SEVERE PAIN   . simvastatin (ZOCOR)  40 MG tablet Take 40 mg by mouth at bedtime.    . traMADol (ULTRAM) 50 MG tablet Take 50 mg by mouth every 4 (four) hours as needed. for pain     Family History  Problem Relation Age of Onset  . Alcoholism Father   . CAD Brother   . Diabetes Brother   . Colon cancer Neg Hx   . Colon polyps Neg Hx   . Gastric cancer Neg Hx     Social History   Socioeconomic History  . Marital status: Married    Spouse name: Not on file  . Number of children: Not on file  . Years of education: Not on file  . Highest education level: Not on file  Occupational History  . Not on file  Social Needs  . Financial resource strain: Not on file  . Food insecurity:    Worry: Not on file    Inability: Not on file  . Transportation needs:    Medical: Not on file    Non-medical: Not on file  Tobacco Use  . Smoking status: Former Research scientist (life sciences)  . Smokeless tobacco: Never Used  Substance and Sexual Activity  . Alcohol use: Yes    Comment: occ beer  . Drug use: Never  . Sexual activity: Not on file  Lifestyle  . Physical activity:  Days per week: Not on file    Minutes per session: Not on file  . Stress: Not on file  Relationships  . Social connections:    Talks on phone: Not on file    Gets together: Not on file    Attends religious service: Not on file    Active member of club or organization: Not on file    Attends meetings of clubs or organizations: Not on file    Relationship status: Not on file  Other Topics Concern  . 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 OYDXAJO(8786). 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    Review of Systems PER HPI OTHERWISE ALL SYSTEMS ARE NEGATIVE.    Objective:   Physical Exam Vitals signs reviewed.  Constitutional:      General: He is not in acute distress.    Appearance: He is well-developed.  HENT:     Head: Normocephalic and atraumatic.     Mouth/Throat:     Comments: Mask on  Eyes:     General: No scleral icterus.    Pupils: Pupils are equal, round, and reactive to light.  Neck:     Musculoskeletal: Normal range of motion and neck supple.  Cardiovascular:     Rate and Rhythm: Normal rate and regular rhythm.     Heart sounds: Normal heart sounds.  Pulmonary:     Effort: Pulmonary effort is normal. No respiratory distress.     Breath sounds: Normal breath sounds.  Abdominal:     General: Bowel sounds are normal. There is no distension.     Palpations: Abdomen is soft.     Tenderness: There is no abdominal tenderness.  Musculoskeletal:     Right lower leg: No edema.     Left lower leg: No edema.  Lymphadenopathy:     Cervical: No cervical adenopathy.  Neurological:     General: No focal deficit present.     Mental Status: He is alert and oriented to person, place, and time. Mental status is at baseline.  Psychiatric:     Comments: FLAT AFFECT, NL MOOD       Assessment & Plan:

## 2018-11-06 NOTE — Patient Instructions (Signed)
DRINK WATER TO KEEP YOUR URINE LIGHT YELLOW.  FOLLOW A HIGH FIBER DIET. AVOID ITEMS THAT CAUSE BLOATING & GAS. SEE INFO BELOW.   COMPLETE COLONOSCOPY AND UPPER ENDOSCOPY IN 4-6 weeks. YOU MAY BRING THE ENEMA TO ADMINISTER IN THE PREOP AREA.  FOLLOW UP IN THE OFFICE WILL BE SCHEDULED IF NEEDED AFTER ENDOSCOPY.

## 2018-11-06 NOTE — Telephone Encounter (Signed)
Spoke with patient (spouse also on call). He is scheduled for TCS/EGD w/ propofol w/ SLF on 9/1 at 12:30pm. Confirmed mailing address. Aware he will need a pre-op appt prior to procedure and I will mail this to him with his prep instructions. I also advised will send Rx to the pharmacy for his suprep. Orders entered.

## 2018-11-07 NOTE — Progress Notes (Signed)
CC'D TO PCP °

## 2018-11-13 ENCOUNTER — Telehealth: Payer: Self-pay | Admitting: Gastroenterology

## 2018-11-13 NOTE — Telephone Encounter (Signed)
[phone] PATIENT WIFE CALLED AND SAID THAT THE PATIENT IS TYPE 2 DIABETIC. AND THAT THEY ALSO HAVE NOT RECEIVED ANY INFORMATION ON THE ENDO SHE IS SUPPOSED TO DO THE SAME DAY

## 2018-11-13 NOTE — Telephone Encounter (Signed)
Called pt's wife, he received TCS instructions. She thought he would get separate instructions for EGD. Advised her no separate instructions are needed for EGD. Informed her I will ask Dr. Oneida Alar about diabetic medication adjustment.  Dr. Oneida Alar, he takes Metformin 500mg  bid. Does he need to adjust med prior to procedure?

## 2018-11-18 DIAGNOSIS — R0902 Hypoxemia: Secondary | ICD-10-CM | POA: Diagnosis not present

## 2018-11-18 DIAGNOSIS — E7800 Pure hypercholesterolemia, unspecified: Secondary | ICD-10-CM | POA: Diagnosis not present

## 2018-11-18 DIAGNOSIS — E782 Mixed hyperlipidemia: Secondary | ICD-10-CM | POA: Diagnosis not present

## 2018-11-18 DIAGNOSIS — L4050 Arthropathic psoriasis, unspecified: Secondary | ICD-10-CM | POA: Diagnosis not present

## 2018-11-18 DIAGNOSIS — I10 Essential (primary) hypertension: Secondary | ICD-10-CM | POA: Diagnosis not present

## 2018-11-18 DIAGNOSIS — R5383 Other fatigue: Secondary | ICD-10-CM | POA: Diagnosis not present

## 2018-11-18 DIAGNOSIS — E1165 Type 2 diabetes mellitus with hyperglycemia: Secondary | ICD-10-CM | POA: Diagnosis not present

## 2018-11-19 NOTE — Telephone Encounter (Signed)
Tried to call pt, no answer, left detailed message on answering machine and informed pt.

## 2018-11-19 NOTE — Telephone Encounter (Signed)
PLEASE CALL PT. HE CAN TAKE METFORMIN ON THE DAY OF HIS PROCEDURE.

## 2018-11-25 DIAGNOSIS — E1165 Type 2 diabetes mellitus with hyperglycemia: Secondary | ICD-10-CM | POA: Diagnosis not present

## 2018-11-25 DIAGNOSIS — L4050 Arthropathic psoriasis, unspecified: Secondary | ICD-10-CM | POA: Diagnosis not present

## 2018-11-25 DIAGNOSIS — G4701 Insomnia due to medical condition: Secondary | ICD-10-CM | POA: Diagnosis not present

## 2018-11-25 DIAGNOSIS — D509 Iron deficiency anemia, unspecified: Secondary | ICD-10-CM | POA: Diagnosis not present

## 2018-11-25 DIAGNOSIS — E782 Mixed hyperlipidemia: Secondary | ICD-10-CM | POA: Diagnosis not present

## 2018-11-25 DIAGNOSIS — I10 Essential (primary) hypertension: Secondary | ICD-10-CM | POA: Diagnosis not present

## 2018-11-25 DIAGNOSIS — J439 Emphysema, unspecified: Secondary | ICD-10-CM | POA: Diagnosis not present

## 2018-11-25 DIAGNOSIS — Z6825 Body mass index (BMI) 25.0-25.9, adult: Secondary | ICD-10-CM | POA: Diagnosis not present

## 2018-11-27 ENCOUNTER — Telehealth: Payer: Self-pay | Admitting: *Deleted

## 2018-11-27 NOTE — Telephone Encounter (Signed)
Patient spouse called. Patient saw Dr. Quintin Alto and had lab work done last week. She will have them fax the results over to [place]. She states patients hemoglobin has dropped to 10.4. Dr. Quintin Alto advised them to call us.

## 2018-11-27 NOTE — Telephone Encounter (Signed)
PLEASE CALL PT. WE WILL PLACE HIM ON A CANCELLATION AND WILL TRY TO GET HIS ENDOSCOPY SCHEDULED WITHIN THE NEXT MONTH.

## 2018-11-27 NOTE — Telephone Encounter (Signed)
Called spouse. Patient procedure has been moved up to 7/27 at 8:30am. She is aware will mail new instructions with new pre-op appt. She voiced understanding.

## 2018-11-27 NOTE — Telephone Encounter (Signed)
Noted  

## 2018-11-28 DIAGNOSIS — R634 Abnormal weight loss: Secondary | ICD-10-CM | POA: Diagnosis not present

## 2018-11-28 DIAGNOSIS — Z79899 Other long term (current) drug therapy: Secondary | ICD-10-CM | POA: Diagnosis not present

## 2018-11-28 DIAGNOSIS — E785 Hyperlipidemia, unspecified: Secondary | ICD-10-CM | POA: Diagnosis not present

## 2018-11-28 DIAGNOSIS — M1990 Unspecified osteoarthritis, unspecified site: Secondary | ICD-10-CM | POA: Diagnosis not present

## 2018-11-28 DIAGNOSIS — I10 Essential (primary) hypertension: Secondary | ICD-10-CM | POA: Diagnosis not present

## 2018-11-28 DIAGNOSIS — E119 Type 2 diabetes mellitus without complications: Secondary | ICD-10-CM | POA: Diagnosis not present

## 2018-11-28 DIAGNOSIS — L4050 Arthropathic psoriasis, unspecified: Secondary | ICD-10-CM | POA: Diagnosis not present

## 2018-11-28 DIAGNOSIS — M79643 Pain in unspecified hand: Secondary | ICD-10-CM | POA: Diagnosis not present

## 2018-11-28 NOTE — Telephone Encounter (Signed)
Per Dr. Oneida Alar patient does not need to hold diabetic medications for procedure.

## 2018-12-02 ENCOUNTER — Telehealth: Payer: Self-pay | Admitting: Gastroenterology

## 2018-12-02 NOTE — Telephone Encounter (Signed)
Pt is scheduled with SF on 7/27 and was letting us know that his PCP has put him on Ferrous Sulfate 325mg  iron supplement. [phone]

## 2018-12-02 NOTE — Telephone Encounter (Signed)
Called spouse and made aware. She made a note. Nothing further needed

## 2018-12-02 NOTE — Telephone Encounter (Signed)
Pt should hold iron 7 days prior to endoscopy.

## 2018-12-17 DIAGNOSIS — Z85828 Personal history of other malignant neoplasm of skin: Secondary | ICD-10-CM | POA: Diagnosis not present

## 2018-12-17 DIAGNOSIS — L570 Actinic keratosis: Secondary | ICD-10-CM | POA: Diagnosis not present

## 2018-12-17 DIAGNOSIS — L400 Psoriasis vulgaris: Secondary | ICD-10-CM | POA: Diagnosis not present

## 2018-12-25 NOTE — Patient Instructions (Signed)
Jeremy [name]  12/25/2018     @PREFPERIOPPHARMACY @   Your procedure is scheduled on  12/30/2018  Report to [facility] at  700  A.M.  Call this number if you have problems the morning of surgery:  [phone]   Remember:  Follow the diet and prep instructions given to you by Dr Nona Dell office.                      Take these medicines the morning of surgery with A SIP OF WATER  Flexaril( if needed), oxycodone( if needed) or tramadol(if needed). DO NOT take any medications for diabetes the morning of your procedure.    Do not wear jewelry, make-up or nail polish.  Do not wear lotions, powders, or perfumes, or deodorant.  Do not shave 48 hours prior to surgery.  Men may shave face and neck.  Do not bring valuables to the hospital.  [facility] is not responsible for any belongings or valuables.  Contacts, dentures or bridgework may not be worn into surgery.  Leave your suitcase in the car.  After surgery it may be brought to your room.  For patients admitted to the hospital, discharge time will be determined by your treatment team.  Patients discharged the day of surgery will not be allowed to drive home.   Name and phone number of your driver:   family Special instructions:  None  Please read over the following fact sheets that you were given. Anesthesia Post-op Instructions and Care and Recovery After Surgery       Upper Endoscopy, Adult, Care After This sheet gives you information about how to care for yourself after your procedure. Your health care provider may also give you more specific instructions. If you have problems or questions, contact your health care provider. What can I expect after the procedure? After the procedure, it is common to have:  A sore throat.  Mild stomach pain or discomfort.  Bloating.  Nausea. Follow these instructions at home:   Follow instructions from your health care provider about what to eat or drink after your  procedure.  Return to your normal activities as told by your health care provider. Ask your health care provider what activities are safe for you.  Take over-the-counter and prescription medicines only as told by your health care provider.  Do not drive for 24 hours if you were given a sedative during your procedure.  Keep all follow-up visits as told by your health care provider. This is important. Contact a health care provider if you have:  A sore throat that lasts longer than one day.  Trouble swallowing. Get help right away if:  You vomit blood or your vomit looks like coffee grounds.  You have: ? A fever. ? Bloody, black, or tarry stools. ? A severe sore throat or you cannot swallow. ? Difficulty breathing. ? Severe pain in your chest or abdomen. Summary  After the procedure, it is common to have a sore throat, mild stomach discomfort, bloating, and nausea.  Do not drive for 24 hours if you were given a sedative during the procedure.  Follow instructions from your health care provider about what to eat or drink after your procedure.  Return to your normal activities as told by your health care provider. This information is not intended to replace advice given to you by your health care provider. Make sure you discuss any questions you have  with your health care provider. Document Released: 11/21/2011 Document Revised: 11/13/2017 Document Reviewed: 10/22/2017 Elsevier Patient Education  2020 [employer].  Colonoscopy, Adult, Care After This sheet gives you information about how to care for yourself after your procedure. Your health care provider may also give you more specific instructions. If you have problems or questions, contact your health care provider. What can I expect after the procedure? After the procedure, it is common to have:  A small amount of blood in your stool for 24 hours after the procedure.  Some gas.  Mild abdominal cramping or bloating.  Follow these instructions at home: General instructions  For the first 24 hours after the procedure: ? Do not drive or use machinery. ? Do not sign important documents. ? Do not drink alcohol. ? Do your regular daily activities at a slower pace than normal. ? Eat soft, easy-to-digest foods.  Take over-the-counter or prescription medicines only as told by your health care provider. Relieving cramping and bloating   Try walking around when you have cramps or feel bloated.  Apply heat to your abdomen as told by your health care provider. Use a heat source that your health care provider recommends, such as a moist heat pack or a heating pad. ? Place a towel between your skin and the heat source. ? Leave the heat on for 20-30 minutes. ? Remove the heat if your skin turns bright red. This is especially important if you are unable to feel pain, heat, or cold. You may have a greater risk of getting burned. Eating and drinking   Drink enough fluid to keep your urine pale yellow.  Resume your normal diet as instructed by your health care provider. Avoid heavy or fried foods that are hard to digest.  Avoid drinking alcohol for as long as instructed by your health care provider. Contact a health care provider if:  You have blood in your stool 2-3 days after the procedure. Get help right away if:  You have more than a small spotting of blood in your stool.  You pass large blood clots in your stool.  Your abdomen is swollen.  You have nausea or vomiting.  You have a fever.  You have increasing abdominal pain that is not relieved with medicine. Summary  After the procedure, it is common to have a small amount of blood in your stool. You may also have mild abdominal cramping and bloating.  For the first 24 hours after the procedure, do not drive or use machinery, sign important documents, or drink alcohol.  Contact your health care provider if you have a lot of blood in your stool,  nausea or vomiting, a fever, or increased abdominal pain. This information is not intended to replace advice given to you by your health care provider. Make sure you discuss any questions you have with your health care provider. Document Released: 01/04/2004 Document Revised: 03/14/2017 Document Reviewed: 08/03/2015 Elsevier Patient Education  2020 [place] After These instructions provide you with information about caring for yourself after your procedure. Your health care provider may also give you more specific instructions. Your treatment has been planned according to current medical practices, but problems sometimes occur. Call your health care provider if you have any problems or questions after your procedure. What can I expect after the procedure? After your procedure, you may:  Feel sleepy for several hours.  Feel clumsy and have poor balance for several hours.  Feel forgetful about  what happened after the procedure.  Have poor judgment for several hours.  Feel nauseous or vomit.  Have a sore throat if you had a breathing tube during the procedure. Follow these instructions at home: For at least 24 hours after the procedure:      Have a responsible adult stay with you. It is important to have someone help care for you until you are awake and alert.  Rest as needed.  Do not: ? Participate in activities in which you could fall or become injured. ? Drive. ? Use heavy machinery. ? Drink alcohol. ? Take sleeping pills or medicines that cause drowsiness. ? Make important decisions or sign legal documents. ? Take care of children on your own. Eating and drinking  Follow the diet that is recommended by your health care provider.  If you vomit, drink water, juice, or soup when you can drink without vomiting.  Make sure you have little or no nausea before eating solid foods. General instructions  Take over-the-counter and  prescription medicines only as told by your health care provider.  If you have sleep apnea, surgery and certain medicines can increase your risk for breathing problems. Follow instructions from your health care provider about wearing your sleep device: ? Anytime you are sleeping, including during daytime naps. ? While taking prescription pain medicines, sleeping medicines, or medicines that make you drowsy.  If you smoke, do not smoke without supervision.  Keep all follow-up visits as told by your health care provider. This is important. Contact a health care provider if:  You keep feeling nauseous or you keep vomiting.  You feel light-headed.  You develop a rash.  You have a fever. Get help right away if:  You have trouble breathing. Summary  For several hours after your procedure, you may feel sleepy and have poor judgment.  Have a responsible adult stay with you for at least 24 hours or until you are awake and alert. This information is not intended to replace advice given to you by your health care provider. Make sure you discuss any questions you have with your health care provider. Document Released: 09/12/2015 Document Revised: 08/20/2017 Document Reviewed: 09/12/2015 Elsevier Patient Education  2020 [employer].

## 2018-12-26 ENCOUNTER — Other Ambulatory Visit (HOSPITAL_COMMUNITY)
Admission: RE | Admit: 2018-12-26 | Discharge: 2018-12-26 | Disposition: A | Discharge status: Home / Self Care | Payer: Medicare Other | Source: Ambulatory Visit | Attending: Gastroenterology | Admitting: Gastroenterology

## 2018-12-26 ENCOUNTER — Other Ambulatory Visit: Payer: Self-pay

## 2018-12-26 ENCOUNTER — Encounter (HOSPITAL_COMMUNITY): Payer: Self-pay

## 2018-12-26 ENCOUNTER — Encounter (HOSPITAL_COMMUNITY)
Admission: RE | Admit: 2018-12-26 | Discharge: 2018-12-26 | Disposition: A | Discharge status: Home / Self Care | Payer: Medicare Other | Source: Ambulatory Visit | Attending: Gastroenterology | Admitting: Gastroenterology

## 2018-12-26 DIAGNOSIS — Z7984 Long term (current) use of oral hypoglycemic drugs: Secondary | ICD-10-CM | POA: Insufficient documentation

## 2018-12-26 DIAGNOSIS — I10 Essential (primary) hypertension: Secondary | ICD-10-CM | POA: Insufficient documentation

## 2018-12-26 DIAGNOSIS — J449 Chronic obstructive pulmonary disease, unspecified: Secondary | ICD-10-CM | POA: Insufficient documentation

## 2018-12-26 DIAGNOSIS — Z01812 Encounter for preprocedural laboratory examination: Secondary | ICD-10-CM | POA: Insufficient documentation

## 2018-12-26 DIAGNOSIS — Z79899 Other long term (current) drug therapy: Secondary | ICD-10-CM | POA: Insufficient documentation

## 2018-12-26 DIAGNOSIS — E119 Type 2 diabetes mellitus without complications: Secondary | ICD-10-CM | POA: Insufficient documentation

## 2018-12-26 DIAGNOSIS — Z87891 Personal history of nicotine dependence: Secondary | ICD-10-CM | POA: Insufficient documentation

## 2018-12-26 DIAGNOSIS — R195 Other fecal abnormalities: Secondary | ICD-10-CM | POA: Insufficient documentation

## 2018-12-26 DIAGNOSIS — Z20828 Contact with and (suspected) exposure to other viral communicable diseases: Secondary | ICD-10-CM | POA: Insufficient documentation

## 2018-12-26 LAB — CBC WITH DIFFERENTIAL/PLATELET
Abs Immature Granulocytes: 0.06 10*3/uL (ref 0.00–0.07)
Basophils Absolute: 0.1 10*3/uL (ref 0.0–0.1)
Basophils Relative: 0 %
Eosinophils Absolute: 0.7 10*3/uL — ABNORMAL HIGH (ref 0.0–0.5)
Eosinophils Relative: 6 %
HCT: 33.1 % — ABNORMAL LOW (ref 39.0–52.0)
Hemoglobin: 10.2 g/dL — ABNORMAL LOW (ref 13.0–17.0)
Immature Granulocytes: 1 %
Lymphocytes Relative: 15 %
Lymphs Abs: 1.7 10*3/uL (ref 0.7–4.0)
MCH: 29.7 pg (ref 26.0–34.0)
MCHC: 30.8 g/dL (ref 30.0–36.0)
MCV: 96.2 fL (ref 80.0–100.0)
Monocytes Absolute: 1 10*3/uL (ref 0.1–1.0)
Monocytes Relative: 8 %
Neutro Abs: 8.2 10*3/uL — ABNORMAL HIGH (ref 1.7–7.7)
Neutrophils Relative %: 70 %
Platelets: 249 10*3/uL (ref 150–400)
RBC: 3.44 MIL/uL — ABNORMAL LOW (ref 4.22–5.81)
RDW: 16.9 % — ABNORMAL HIGH (ref 11.5–15.5)
WBC: 11.7 10*3/uL — ABNORMAL HIGH (ref 4.0–10.5)
nRBC: 0 % (ref 0.0–0.2)

## 2018-12-26 LAB — BASIC METABOLIC PANEL
Anion gap: 8 (ref 5–15)
BUN: 24 mg/dL — ABNORMAL HIGH (ref 8–23)
CO2: 26 mmol/L (ref 22–32)
Calcium: 8.6 mg/dL — ABNORMAL LOW (ref 8.9–10.3)
Chloride: 105 mmol/L (ref 98–111)
Creatinine, Ser: 0.83 mg/dL (ref 0.61–1.24)
GFR calc Af Amer: >60 mL/min (ref >60)
GFR calc non Af Amer: >60 mL/min (ref >60)
Glucose, Bld: 119 mg/dL — ABNORMAL HIGH (ref 70–99)
Potassium: 4.4 mmol/L (ref 3.5–5.1)
Sodium: 139 mmol/L (ref 135–145)

## 2018-12-27 LAB — SARS CORONAVIRUS 2 (TAT 6-24 HRS): SARS Coronavirus 2: NEGATIVE

## 2018-12-30 ENCOUNTER — Encounter (HOSPITAL_COMMUNITY)
Admission: RE | Disposition: A | Discharge status: Home / Self Care | Payer: Self-pay | Source: Home / Self Care | Attending: Gastroenterology

## 2018-12-30 ENCOUNTER — Ambulatory Visit (HOSPITAL_COMMUNITY): Payer: Medicare Other | Admitting: Anesthesiology

## 2018-12-30 ENCOUNTER — Ambulatory Visit (HOSPITAL_COMMUNITY)
Admission: RE | Admit: 2018-12-30 | Discharge: 2018-12-30 | Disposition: A | Discharge status: Home / Self Care | Payer: Medicare Other | Attending: Gastroenterology | Admitting: Gastroenterology

## 2018-12-30 ENCOUNTER — Other Ambulatory Visit: Payer: Self-pay

## 2018-12-30 ENCOUNTER — Encounter (HOSPITAL_COMMUNITY): Payer: Self-pay | Admitting: Emergency Medicine

## 2018-12-30 DIAGNOSIS — K222 Esophageal obstruction: Secondary | ICD-10-CM | POA: Insufficient documentation

## 2018-12-30 DIAGNOSIS — K2981 Duodenitis with bleeding: Secondary | ICD-10-CM | POA: Insufficient documentation

## 2018-12-30 DIAGNOSIS — K644 Residual hemorrhoidal skin tags: Secondary | ICD-10-CM | POA: Diagnosis not present

## 2018-12-30 DIAGNOSIS — D128 Benign neoplasm of rectum: Secondary | ICD-10-CM | POA: Insufficient documentation

## 2018-12-30 DIAGNOSIS — E119 Type 2 diabetes mellitus without complications: Secondary | ICD-10-CM | POA: Diagnosis not present

## 2018-12-30 DIAGNOSIS — K5710 Diverticulosis of small intestine without perforation or abscess without bleeding: Secondary | ICD-10-CM | POA: Insufficient documentation

## 2018-12-30 DIAGNOSIS — Z87891 Personal history of nicotine dependence: Secondary | ICD-10-CM | POA: Diagnosis not present

## 2018-12-30 DIAGNOSIS — K635 Polyp of colon: Secondary | ICD-10-CM

## 2018-12-30 DIAGNOSIS — K449 Diaphragmatic hernia without obstruction or gangrene: Secondary | ICD-10-CM | POA: Diagnosis not present

## 2018-12-30 DIAGNOSIS — K621 Rectal polyp: Secondary | ICD-10-CM

## 2018-12-30 DIAGNOSIS — K2980 Duodenitis without bleeding: Secondary | ICD-10-CM

## 2018-12-30 DIAGNOSIS — R195 Other fecal abnormalities: Secondary | ICD-10-CM

## 2018-12-30 DIAGNOSIS — K219 Gastro-esophageal reflux disease without esophagitis: Secondary | ICD-10-CM | POA: Diagnosis not present

## 2018-12-30 DIAGNOSIS — J449 Chronic obstructive pulmonary disease, unspecified: Secondary | ICD-10-CM | POA: Insufficient documentation

## 2018-12-30 DIAGNOSIS — D125 Benign neoplasm of sigmoid colon: Secondary | ICD-10-CM | POA: Diagnosis not present

## 2018-12-30 DIAGNOSIS — K5730 Diverticulosis of large intestine without perforation or abscess without bleeding: Secondary | ICD-10-CM | POA: Diagnosis not present

## 2018-12-30 DIAGNOSIS — D124 Benign neoplasm of descending colon: Secondary | ICD-10-CM | POA: Insufficient documentation

## 2018-12-30 DIAGNOSIS — K2971 Gastritis, unspecified, with bleeding: Secondary | ICD-10-CM | POA: Insufficient documentation

## 2018-12-30 DIAGNOSIS — K648 Other hemorrhoids: Secondary | ICD-10-CM | POA: Insufficient documentation

## 2018-12-30 DIAGNOSIS — D649 Anemia, unspecified: Secondary | ICD-10-CM | POA: Insufficient documentation

## 2018-12-30 DIAGNOSIS — Z79899 Other long term (current) drug therapy: Secondary | ICD-10-CM | POA: Insufficient documentation

## 2018-12-30 DIAGNOSIS — I10 Essential (primary) hypertension: Secondary | ICD-10-CM | POA: Insufficient documentation

## 2018-12-30 DIAGNOSIS — K2970 Gastritis, unspecified, without bleeding: Secondary | ICD-10-CM | POA: Diagnosis not present

## 2018-12-30 DIAGNOSIS — L4050 Arthropathic psoriasis, unspecified: Secondary | ICD-10-CM | POA: Diagnosis not present

## 2018-12-30 DIAGNOSIS — Q438 Other specified congenital malformations of intestine: Secondary | ICD-10-CM | POA: Insufficient documentation

## 2018-12-30 DIAGNOSIS — Z7984 Long term (current) use of oral hypoglycemic drugs: Secondary | ICD-10-CM | POA: Diagnosis not present

## 2018-12-30 HISTORY — PX: COLONOSCOPY WITH PROPOFOL: SHX5780

## 2018-12-30 HISTORY — PX: BIOPSY: SHX5522

## 2018-12-30 HISTORY — PX: ESOPHAGOGASTRODUODENOSCOPY (EGD) WITH PROPOFOL: SHX5813

## 2018-12-30 HISTORY — PX: POLYPECTOMY: SHX5525

## 2018-12-30 LAB — GLUCOSE, CAPILLARY
Glucose-Capillary: 135 mg/dL — ABNORMAL HIGH (ref 70–99)
Glucose-Capillary: 121 mg/dL — ABNORMAL HIGH (ref 70–99)

## 2018-12-30 SURGERY — COLONOSCOPY WITH PROPOFOL
Anesthesia: General

## 2018-12-30 MED ORDER — HYDROCODONE-ACETAMINOPHEN 7.5-325 MG PO TABS: 1.0000 | ORAL_TABLET | Freq: Once | ORAL | Status: DC | PRN

## 2018-12-30 MED ORDER — CHLORHEXIDINE GLUCONATE CLOTH 2 % EX PADS: 6.0000 | MEDICATED_PAD | Freq: Once | CUTANEOUS | Status: DC

## 2018-12-30 MED ORDER — PROMETHAZINE HCL 25 MG/ML IJ SOLN: 6.2500 mg | INTRAMUSCULAR | Status: DC | PRN

## 2018-12-30 MED ORDER — KETAMINE HCL 50 MG/5ML IJ SOSY
PREFILLED_SYRINGE | INTRAMUSCULAR | Status: AC
  Filled 2018-12-30: qty 5

## 2018-12-30 MED ORDER — MIDAZOLAM HCL 2 MG/2ML IJ SOLN: 0.5000 mg | Freq: Once | INTRAMUSCULAR | Status: DC | PRN

## 2018-12-30 MED ORDER — KETAMINE HCL 10 MG/ML IJ SOLN
INTRAMUSCULAR | Status: DC | PRN
  Administered 2018-12-30: 10 mg via INTRAVENOUS

## 2018-12-30 MED ORDER — PROPOFOL 10 MG/ML IV BOLUS
INTRAVENOUS | Status: DC | PRN
  Administered 2018-12-30 (×2): 20 mg via INTRAVENOUS

## 2018-12-30 MED ORDER — LACTATED RINGERS IV SOLN
INTRAVENOUS | Status: DC
  Administered 2018-12-30: 08:00:00 via INTRAVENOUS

## 2018-12-30 MED ORDER — PROPOFOL 10 MG/ML IV BOLUS
INTRAVENOUS | Status: AC
  Filled 2018-12-30: qty 60

## 2018-12-30 MED ORDER — PANTOPRAZOLE SODIUM 40 MG PO TBEC: DELAYED_RELEASE_TABLET | ORAL | 11 refills | Status: DC

## 2018-12-30 MED ORDER — HYDROMORPHONE HCL 1 MG/ML IJ SOLN: 0.2500 mg | INTRAMUSCULAR | Status: DC | PRN

## 2018-12-30 MED ORDER — PROPOFOL 500 MG/50ML IV EMUL
INTRAVENOUS | Status: DC | PRN
  Administered 2018-12-30: 150 ug/kg/min via INTRAVENOUS

## 2018-12-30 NOTE — Anesthesia Postprocedure Evaluation (Signed)
Anesthesia Post Note  Patient: Jeremy [name]  Procedure(s) Performed: COLONOSCOPY WITH PROPOFOL (N/A ) ESOPHAGOGASTRODUODENOSCOPY (EGD) WITH PROPOFOL (N/A ) POLYPECTOMY BIOPSY  Patient location during evaluation: PACU Anesthesia Type: General Level of consciousness: awake and alert and sedated Pain management: pain level controlled Vital Signs Assessment: post-procedure vital signs reviewed and stable Respiratory status: spontaneous breathing Cardiovascular status: blood pressure returned to baseline and stable Postop Assessment: no apparent nausea or vomiting Anesthetic complications: no     Last Vitals:  Vitals:   12/30/18 0715 12/30/18 0721  BP: 119/73   Pulse: 87   Resp: (!) 25   Temp: 36.5 C   SpO2: (!) 88% 93%    Last Pain:  Vitals:   12/30/18 0836  TempSrc:   PainSc: 0-No pain                 Mistey Hoffert

## 2018-12-30 NOTE — H&P (Signed)
Primary Care Physician:  [name], MD Primary Gastroenterologist:  Dr. Oneida Alar  Pre-Procedure History & Physical: HPI:  Jeremy [name] is a 73 y.o. male here for HEME POS STOOLS.  Past Medical History:  Diagnosis Date  . COPD (chronic obstructive pulmonary disease) (HCC)    QUIT SMOKING 30 YRS AGO  . Diabetes ([place]) 2018  . HTN (hypertension)   . Psoriatic arthritis ([place]) 2015    Past Surgical History:  Procedure Laterality Date  . BACK SURGERY  1990  . CATARACT EXTRACTION Bilateral   . CERVICAL SPINE SURGERY  1995    Prior to Admission medications   Medication Sig Start Date End Date Taking? Authorizing Provider  acetaminophen (TYLENOL) 500 MG tablet Take 500 mg by mouth every 4 (four) hours as needed (for pain.).   Yes [provider]  Ascorbic Acid (VITAMIN C) 1000 MG tablet Take 1,000 mg by mouth daily.   Yes [provider]  cyclobenzaprine (FLEXERIL) 10 MG tablet Take 10 mg by mouth at bedtime. 09/13/18  Yes [provider]  doxycycline (MONODOX) 50 MG capsule Take 50 mg by mouth 2 (two) times daily as needed (rosacea flare ups.).   Yes [provider]  folic acid (FOLVITE) 1 MG tablet Take 1 mg by mouth daily.   Yes [provider]  lisinopril (ZESTRIL) 20 MG tablet Take 20 mg by mouth every evening.  10/01/18  Yes [provider]  metFORMIN (GLUCOPHAGE-XR) 500 MG 24 hr tablet Take 500-1,000 mg by mouth See admin instructions. Take 2 tablets (1000 mg) by mouth in the morning & 1 tablet (500 mg) by mouth at night. 10/15/18  Yes [provider]  methotrexate 50 MG/2ML injection Inject 17.5 mg into the muscle every Friday.  09/16/18  Yes [provider]  metroNIDAZOLE (METROCREAM) 0.75 % cream Apply 1 application topically 2 (two) times daily as needed (facial rosacea).    Yes [provider]  mometasone (ELOCON) 0.1 % cream Apply 1 application topically 2 (two) times daily as needed (psoriasis).     Yes [provider]  Omega-3 Fatty Acids (FISH OIL) 1000 MG CAPS Take 1,000 mg by mouth daily.    Yes [provider]  oxyCODONE-acetaminophen (PERCOCET/ROXICET) 5-325 MG tablet Take 1 tablet by mouth every 4 (four) hours as needed for severe pain.   Yes [provider]  simvastatin (ZOCOR) 40 MG tablet Take 40 mg by mouth at bedtime. 10/15/18  Yes [provider]  traMADol (ULTRAM) 50 MG tablet Take 50 mg by mouth every 4 (four) hours as needed. for pain 08/19/18  Yes [provider]  ferrous sulfate 325 (65 FE) MG tablet Take 325 mg by mouth daily with supper.    [provider]  Na Sulfate-K Sulfate-Mg Sulf 17.5-3.13-1.6 GM/177ML SOLN Take 1 kit by mouth as directed. 11/06/18   Danie Binder, MD    Allergies as of 11/06/2018 - Review Complete 11/06/2018  Allergen Reaction Noted  . Aleve [naproxen sodium] Hives and Rash 11/06/2018    Family History  Problem Relation Age of Onset  . Alcoholism Father   . CAD Brother   . Diabetes Brother   . Colon cancer Neg Hx   . Colon polyps Neg Hx   . Gastric cancer Neg Hx     Social History   Socioeconomic History  . Marital status: Married    Spouse name: Not on file  . Number of children: Not on file  . Years of education:  Not on file  . Highest education level: Not on file  Occupational History  . Not on file  Social Needs  . Financial resource strain: Not on file  . Food insecurity    Worry: Not on file    Inability: Not on file  . Transportation needs    Medical: Not on file    Non-medical: Not on file  Tobacco Use  . Smoking status: Former Smoker    Packs/day: 0.50    Years: 50.00    Pack years: 25.00    Types: Cigarettes    Quit date: 12/26/1991    Years since quitting: 27.0  . Smokeless tobacco: Never Used  Substance and Sexual Activity  . Alcohol use: Yes    Comment: occ beer  . Drug use: Never  . Sexual activity: Yes    Birth control/protection: None  Lifestyle   . Physical activity    Days per week: Not on file    Minutes per session: Not on file  . Stress: Not on file  Relationships  . Social Herbalist on phone: Not on file    Gets together: Not on file    Attends religious service: Not on file    Active member of club or organization: Not on file    Attends meetings of clubs or organizations: Not on file    Relationship status: Not on file  . Intimate partner violence    Fear of current or ex partner: Not on file    Emotionally abused: Not on file    Physically abused: Not on file    Forced sexual activity: Not on file  Other Topics Concern  . 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 UXYBFXO(3291). 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    Review of Systems: See HPI, otherwise negative ROS   Physical Exam: BP 119/73   Pulse 87   Temp 97.7 F (36.5 C) (Oral)   Resp (!) 25   SpO2 93%  General:   Alert,  pleasant and cooperative in NAD Head:  Normocephalic and atraumatic. Neck:  Supple; Lungs:  Clear throughout to auscultation.    Heart:  Regular rate and rhythm. Abdomen:  Soft, nontender and nondistended. Normal bowel sounds, without guarding, and without rebound.   Neurologic:  Alert and  oriented x4;  grossly normal neurologically.  Impression/Plan:     HEME POS STOOLS  PLAN:  1.TCS/EGD TODAY. DISCUSSED PROCEDURE, BENEFITS, & RISKS: < 1% chance of medication reaction, bleeding, perforation, ASPIRATION, or rupture of spleen/liver requiring surgery to fix it and missed polyps < 1 cm 10-20% of the time.

## 2018-12-30 NOTE — Anesthesia Preprocedure Evaluation (Signed)
Anesthesia Evaluation  Patient identified by MRN, date of birth, ID band Patient awake    Reviewed: Allergy & Precautions, NPO status , Patient's Chart, lab work & pertinent test results  Airway Mallampati: II  TM Distance: >3 FB Neck ROM: Limited    Dental no notable dental hx. (+) Teeth Intact   Pulmonary shortness of breath and with exertion, COPD, former smoker,  States Ex smoker with COPD -denies inhaler use -breathing at baseline   Pulmonary exam normal breath sounds clear to auscultation       Cardiovascular Exercise Tolerance: Good hypertension, Pt. 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 and pain meds chronically    Abdominal   Peds negative pediatric ROS (+)  Hematology negative hematology ROS (+)   Anesthesia Other Findings   Reproductive/Obstetrics negative OB ROS                             Anesthesia Physical Anesthesia Plan  ASA: III  Anesthesia Plan: General   Post-op Pain Management:    Induction: Intravenous  PONV Risk Score and Plan: 1 and Treatment may vary due to age or medical condition, Propofol infusion and TIVA  Airway Management Planned:   Additional Equipment:   Intra-op Plan:   Post-operative Plan:   Informed Consent: I have reviewed the patients History and Physical, chart, labs and discussed the procedure including the risks, benefits and alternatives for the proposed anesthesia with the patient or authorized representative who has indicated his/her  understanding and acceptance.     Dental advisory given  Plan Discussed with: CRNA  Anesthesia Plan Comments: (Plan Full PPE use  Plan GA with GETA as needed d/w pt -WTP with same )        Anesthesia Quick Evaluation

## 2018-12-30 NOTE — Progress Notes (Signed)
CC'D TO PCP °

## 2018-12-30 NOTE — Discharge Instructions (Signed)
The blood detected in your stool was due TO GASTRITIS, POLYPS, AND INTERNAL HEMORRHOIDS. You had 3 polyps removed. You have moderate external and internal hemorrhoids. You have an esophageal structure DUE TO UNCONTROLLED REFLUX, MODERATE SIZE HIATAL HERNIA, MODERATE gastritis/DUODENITIS and a duodenal diverticulum. I biopsied your stomach.     DRINK WATER TO KEEP YOUR URINE LIGHT YELLOW.  FOLLOW A HIGH FIBER/LOW FAT DIET. AVOID ITEMS THAT CAUSE BLOATING. SEE INFO BELOW.  AVOID REFLUX TRIGGERS. SEE INFO BELOW.   TO TREAT GASTRITIS, DUODENITIS, AND REFLUX, START PROTONIX.  TAKE 30 MINUTES PRIOR TO BREAKFAST FOREVER.  USE PREPARATION H FOUR TIMES  A DAY IF NEEDED TO RELIEVE RECTAL PAIN/PRESSURE/BLEEDING.   YOUR BIOPSY RESULTS WILL BE BACK IN 5 BUSINESS DAYS.   FOLLOW UP IN 6 MOS.   We do not routinely screen for polyps after the age of 69. YOU SHOULD HAVE Flexible sigmoidoscopy in 3 years to reassess rectum.    ENDOSCOPY Care After Read the instructions outlined below and refer to this sheet in the next week. These discharge instructions provide you with general information on caring for yourself after you leave the hospital. While your treatment has been planned according to the most current medical practices available, unavoidable complications occasionally occur. If you have any problems or questions after discharge, call DR. Sharmel Ballantine, [phone].  ACTIVITY  You may resume your regular activity, but move at a slower pace for the next 24 hours.   Take frequent rest periods for the next 24 hours.   Walking will help get rid of the air and reduce the bloated feeling in your belly (abdomen).   No driving for 24 hours (because of the medicine (anesthesia) used during the test).   You may shower.   Do not sign any important legal documents or operate any machinery for 24 hours (because of the anesthesia used during the test).    NUTRITION  Drink plenty of fluids.   You may  resume your normal diet as instructed by your doctor.   Begin with a light meal and progress to your normal diet. Heavy or fried foods are harder to digest and may make you feel sick to your stomach (nauseated).   Avoid alcoholic beverages for 24 hours or as instructed.    MEDICATIONS  You may resume your normal medications.   WHAT YOU CAN EXPECT TODAY  Some feelings of bloating in the abdomen.   Passage of more gas than usual.   Spotting of blood in your stool or on the toilet paper  .  IF YOU HAD POLYPS REMOVED DURING THE ENDOSCOPY:  Eat a soft diet IF YOU HAVE NAUSEA, BLOATING, ABDOMINAL PAIN, OR VOMITING.    FINDING OUT THE RESULTS OF YOUR TEST Not all test results are available during your visit. DR. Oneida Alar WILL CALL YOU WITHIN 14 DAYS OF YOUR PROCEDUE WITH YOUR RESULTS. Do not assume everything is normal if you have not heard from DR. Taniaya Rudder, CALL HER OFFICE AT [phone].  SEEK IMMEDIATE MEDICAL ATTENTION AND CALL THE OFFICE: [phone] IF:  You have more than a spotting of blood in your stool.   Your belly is swollen (abdominal distention).   You are nauseated or vomiting.   You have a temperature over 101F.   You have abdominal pain or discomfort that is severe or gets worse throughout the day.   Gastritis  Gastritis/DUODENITIS is inflammation (the body's way of reacting to injury and/or infection) of the stomach. It is often caused by viral  or bacterial (germ) infections. It can also be caused BY ASPIRIN, BC/GOODY POWDER'S, (IBUPROFEN) MOTRIN, OR ALEVE (NAPROXEN), chemicals (including alcohol), SPICY FOODS, and medications. This illness may be associated with generalized malaise (feeling tired, not well), UPPER ABDOMINAL STOMACH cramps, and fever. One common bacterial cause of gastritis is an organism known as H. Pylori. This can be treated with antibiotics.    High-Fiber Diet A high-fiber diet changes your normal diet to include more whole grains,  legumes, fruits, and vegetables. Changes in the diet involve replacing refined carbohydrates with unrefined foods. The calorie level of the diet is essentially unchanged. The Dietary Reference Intake (recommended amount) for adult males is 38 grams per day. For adult females, it is 25 grams per day. Pregnant and lactating women should consume 28 grams of fiber per day. Fiber is the intact part of a plant that is not broken down during digestion. Functional fiber is fiber that has been isolated from the plant to provide a beneficial effect in the body.  PURPOSE  Increase stool bulk.   Ease and regulate bowel movements.   Lower cholesterol.   REDUCE RISK OF COLON CANCER  INDICATIONS THAT YOU NEED MORE FIBER  Constipation and hemorrhoids.   Uncomplicated diverticulosis (intestine condition) and irritable bowel syndrome.   Weight management.   As a protective measure against hardening of the arteries (atherosclerosis), diabetes, and cancer.   GUIDELINES FOR INCREASING FIBER IN THE DIET  Start adding fiber to the diet slowly. A gradual increase of about 5 more grams (2 slices of whole-wheat bread, 2 servings of most fruits or vegetables, or 1 bowl of high-fiber cereal) per day is best. Too rapid an increase in fiber may result in constipation, flatulence, and bloating.   Drink enough water and fluids to keep your urine clear or pale yellow. Water, juice, or caffeine-free drinks are recommended. Not drinking enough fluid may cause constipation.   Eat a variety of high-fiber foods rather than one type of fiber.   Try to increase your intake of fiber through using high-fiber foods rather than fiber pills or supplements that contain small amounts of fiber.   The goal is to change the types of food eaten. Do not supplement your present diet with high-fiber foods, but replace foods in your present diet.   INCLUDE A VARIETY OF FIBER SOURCES  Replace refined and processed grains with whole  grains, canned fruits with fresh fruits, and incorporate other fiber sources. White rice, white breads, and most bakery goods contain little or no fiber.   Brown whole-grain rice, buckwheat oats, and many fruits and vegetables are all good sources of fiber. These include: broccoli, Brussels sprouts, cabbage, cauliflower, beets, sweet potatoes, white potatoes (skin on), carrots, tomatoes, eggplant, squash, berries, fresh fruits, and dried fruits.   Cereals appear to be the richest source of fiber. Cereal fiber is found in whole grains and bran. Bran is the fiber-rich outer coat of cereal grain, which is largely removed in refining. In whole-grain cereals, the bran remains. In breakfast cereals, the largest amount of fiber is found in those with "bran" in their names. The fiber content is sometimes indicated on the label.   You may need to include additional fruits and vegetables each day.   In baking, for 1 cup white flour, you may use the following substitutions:   1 cup whole-wheat flour minus 2 tablespoons.   1/2 cup white flour plus 1/2 cup whole-wheat flour.   Low-Fat Diet BREADS, CEREALS, PASTA, RICE, DRIED  PEAS, AND BEANS These products are high in carbohydrates and most are low in fat. Therefore, they can be increased in the diet as substitutes for fatty foods. They too, however, contain calories and should not be eaten in excess. Cereals can be eaten for snacks as well as for breakfast.  Include foods that contain fiber (fruits, vegetables, whole grains, and legumes). Research shows that fiber may lower blood cholesterol levels, especially the water-soluble fiber found in fruits, vegetables, oat products, and legumes. FRUITS AND VEGETABLES It is good to eat fruits and vegetables. Besides being sources of fiber, both are rich in vitamins and some minerals. They help you get the daily allowances of these nutrients. Fruits and vegetables can be used for snacks and desserts. MEATS Limit lean  meat, chicken, [place], and fish to no more than 6 ounces per day. Beef, Pork, and Lamb Use lean cuts of beef, pork, and lamb. Lean cuts include:  Extra-lean ground beef.  Arm roast.  Sirloin tip.  Center-cut ham.  Round steak.  Loin chops.  Rump roast.  Tenderloin.  Trim all fat off the outside of meats before cooking. It is not necessary to severely decrease the intake of red meat, but lean choices should be made. Lean meat is rich in protein and contains a highly absorbable form of iron. Premenopausal women, in particular, should avoid reducing lean red meat because this could increase the risk for low red blood cells (iron-deficiency anemia). The organ meats, such as liver, sweetbreads, kidneys, and brain are very rich in cholesterol. They should be limited. Chicken and [place] These are good sources of protein. The fat of poultry can be reduced by removing the skin and underlying fat layers before cooking. Chicken and [place] can be substituted for lean red meat in the diet. Poultry should not be fried or covered with high-fat sauces. Fish and Shellfish Fish is a good source of protein. Shellfish contain cholesterol, but they usually are low in saturated fatty acids. The preparation of fish is important. Like chicken and [place], they should not be fried or covered with high-fat sauces. EGGS Egg whites contain no fat or cholesterol. They can be eaten often. Try 1 to 2 egg whites instead of whole eggs in recipes or use egg substitutes that do not contain yolk. MILK AND DAIRY PRODUCTS Use skim or 1% milk instead of 2% or whole milk. Decrease whole milk, natural, and processed cheeses. Use nonfat or low-fat (2%) cottage cheese or low-fat cheeses made from vegetable oils. Choose nonfat or low-fat (1 to 2%) yogurt. Experiment with evaporated skim milk in recipes that call for heavy cream. Substitute low-fat yogurt or low-fat cottage cheese for sour cream in dips and salad dressings. Have at least 2  servings of low-fat dairy products, such as 2 glasses of skim (or 1%) milk each day to help get your daily calcium intake.  FATS AND OILS Reduce the total intake of fats, especially saturated fat. Butterfat, lard, and beef fats are high in saturated fat and cholesterol. These should be avoided as much as possible. Vegetable fats do not contain cholesterol, but certain vegetable fats, such as coconut oil, palm oil, and palm kernel oil are very high in saturated fats. These should be limited. These fats are often used in bakery goods, processed foods, popcorn, oils, and nondairy creamers. Vegetable shortenings and some peanut butters contain hydrogenated oils, which are also saturated fats. Read the labels on these foods and check for saturated vegetable oils. Unsaturated vegetable oils  and fats do not raise blood cholesterol. However, they should be limited because they are fats and are high in calories. Total fat should still be limited to 30% of your daily caloric intake. Desirable liquid vegetable oils are corn oil, cottonseed oil, olive oil, canola oil, safflower oil, soybean oil, and sunflower oil. Peanut oil is not as good, but small amounts are acceptable. Buy a heart-healthy tub margarine that has no partially hydrogenated oils in the ingredients. Mayonnaise and salad dressings often are made from unsaturated fats, but they should also be limited because of their high calorie and fat content. Seeds, nuts, peanut butter, olives, and avocados are high in fat, but the fat is mainly the unsaturated type. These foods should be limited mainly to avoid excess calories and fat. OTHER EATING TIPS Snacks  Most sweets should be limited as snacks. They tend to be rich in calories and fats, and their caloric content outweighs their nutritional value. Some good choices in snacks are graham crackers, melba toast, soda crackers, bagels (no egg), English muffins, fruits, and vegetables. These snacks are preferable to  snack crackers, [place] fries, and chips. Popcorn should be air-popped or cooked in small amounts of liquid vegetable oil. Desserts Eat fruit, low-fat yogurt, and fruit ices. AVOID pastries, cake, and cookies. Sherbet, angel food cake, gelatin dessert, frozen low-fat yogurt, or other frozen products that do not contain saturated fat (pure fruit juice bars, frozen ice pops) are also acceptable.  COOKING METHODS Choose those methods that use little or no fat. They include: Poaching.  Braising.  Steaming.  Grilling.  Baking.  Stir-frying.  Broiling.  Microwaving.  Foods can be cooked in a nonstick pan without added fat, or use a nonfat cooking spray in regular cookware. Limit fried foods and avoid frying in saturated fat. Add moisture to lean meats by using water, broth, cooking wines, and other nonfat or low-fat sauces along with the cooking methods mentioned above. Soups and stews should be chilled after cooking. The fat that forms on top after a few hours in the refrigerator should be skimmed off. When preparing meals, avoid using excess salt. Salt can contribute to raising blood pressure in some people. EATING AWAY FROM HOME Order entres, potatoes, and vegetables without sauces or butter. When meat exceeds the size of a deck of cards (3 to 4 ounces), the rest can be taken home for another meal. Choose vegetable or fruit salads and ask for low-calorie salad dressings to be served on the side. Use dressings sparingly. Limit high-fat toppings, such as bacon, crumbled eggs, cheese, sunflower seeds, and olives. Ask for heart-healthy tub margarine instead of butter.    Lifestyle and home remedies TO CONTROL HEARTBURN/REFLUX You may eliminate or reduce the frequency of heartburn by making the following lifestyle changes: Control your weight. Being overweight is a major risk factor for heartburn and GERD. Excess pounds put pressure on your abdomen, pushing up your stomach and causing acid to back up  into your esophagus.  Eat smaller meals. 4 TO 6 MEALS A DAY. This reduces pressure on the lower esophageal sphincter, helping to prevent the valve from opening and acid from washing back into your esophagus.  Loosen your belt. Clothes that fit tightly around your waist put pressure on your abdomen and the lower esophageal sphincter.   Eliminate heartburn triggers. Everyone has specific triggers. Common triggers such as fatty or fried foods, spicy food, tomato sauce, carbonated beverages, alcohol, chocolate, mint, garlic, onion, caffeine and nicotine may make heartburn  worse.   Avoid stooping or bending. Tying your shoes is OK. Bending over for longer periods to weed your garden isn't, especially soon after eating.   Don't lie down after a meal. Wait at least three to four hours after eating before going to bed, and don't lie down right after eating.   Raise the head of your bed. An elevation of about six to nine inches puts gravity to work for you. You can do this by placing wooden or cement blocks under the feet of your bed at the head end. If it's not possible to elevate your bed, you can insert a wedge between your mattress and box spring to elevate your body from the waist up. Wedges are available at drugstores and medical supply stores. Raising your head only by using pillows is not a good alternative.   Alternative medicine Several home remedies exist for treating GERD, but they provide only temporary relief. They include drinking baking soda (sodium bicarbonate) added to water or drinking other fluids such as baking soda mixed with cream of tartar and water. Although these liquids create temporary relief by neutralizing, washing away or buffering acids, eventually they aggravate the situation by adding gas and fluid to your stomach, increasing pressure and causing more acid reflux. Further, adding more sodium to your diet may increase your blood pressure and add stress to your heart, and  excessive bicarbonate ingestion can alter the acid-base balance in your body.    Polyps, Colon  A polyp is extra tissue that grows inside your body. Colon polyps grow in the large intestine. The large intestine, also called the colon, is part of your digestive system. It is a long, hollow tube at the end of your digestive tract where your body makes and stores stool. Most polyps are not dangerous. They are benign. This means they are not cancerous. But over time, some types of polyps can turn into cancer. Polyps that are smaller than a pea are usually not harmful. But larger polyps could someday become or may already be cancerous. To be safe, doctors remove all polyps and test them.   WHO GETS POLYPS? Anyone can get polyps, but certain people are more likely than others. You may have a greater chance of getting polyps if:  You are over 50.   You have had polyps before.   Someone in your family has had polyps.   Someone in your family has had cancer of the large intestine.   Find out if someone in your family has had polyps. You may also be more likely to get polyps if you:              PREVENTION There is not one sure way to prevent polyps. You might be able to lower your risk of getting them if you:  Eat more fruits and vegetables and less fatty food.   Do not smoke.   Avoid alcohol.   Exercise every day.   Lose weight if you are overweight.   Eating more calcium and folate can also lower your risk of getting polyps. Some foods that are rich in calcium are milk, cheese, and broccoli. Some foods that are rich in folate are chickpeas, kidney beans, and spinach.

## 2018-12-30 NOTE — Op Note (Addendum)
[facility] Patient Name: Jeremy [name] Procedure Date: 12/30/2018 8:16 AM MRN: [id number] Date of Birth: 12-Feb-1946 Attending MD: Barney Drain MD, MD CSN: [id number] Age: 73 Admit Type: Outpatient Procedure:                Colonoscopy WITH COLD SNARE POLYPECTOMY Indications:              Heme positive stool Providers:                Barney Drain MD, MD, Otis Peak B. Sharon Seller, RN, Nelma Rothman, Technician Referring MD:             [name] MD, MD Medicines:                Propofol per Anesthesia Complications:            No immediate complications. Estimated Blood Loss:     Estimated blood loss was minimal. Procedure:                Pre-Anesthesia Assessment:                           - Prior to the procedure, a History and Physical                            was performed, and patient medications and                            allergies were reviewed. The patient's tolerance of                            previous anesthesia was also reviewed. The risks                            and benefits of the procedure and the sedation                            options and risks were discussed with the patient.                            All questions were answered, and informed consent                            was obtained. Prior Anticoagulants: The patient has                            taken no previous anticoagulant or antiplatelet                            agents. ASA Grade Assessment: II - A patient with                            mild systemic disease. After reviewing the risks  and benefits, the patient was deemed in                            satisfactory condition to undergo the procedure.                            After obtaining informed consent, the colonoscope                            was passed under direct vision. Throughout the                            procedure, the patient's blood pressure, pulse, and                    oxygen saturations were monitored continuously. The                            CF-HQ190L ([id number]) scope was introduced through                            the anus and advanced to the 10 cm into the ileum.                            The colonoscopy was somewhat difficult due to a                            tortuous colon. Successful completion of the                            procedure was aided by straightening and shortening                            the scope to obtain bowel loop reduction and                            COLOWRAP. The patient tolerated the procedure well.                            The quality of the bowel preparation was excellent.                            The terminal ileum, ileocecal valve, appendiceal                            orifice, and rectum were photographed. Scope In: 8:43:25 AM Scope Out: 9:00:48 AM Scope Withdrawal Time: 0 hours 14 minutes 25 seconds  Total Procedure Duration: 0 hours 17 minutes 23 seconds  Findings:      The terminal ileum appeared normal.      Three sessile polyps were found in the rectum, sigmoid colon and       descending colon. The polyps were 4 to 8 mm in size. These polyps were       removed with a cold snare. Resection and retrieval were complete.  Multiple small and large-mouthed diverticula were found in the       recto-sigmoid colon, sigmoid colon, descending colon, ascending colon       and cecum.      External and internal hemorrhoids were found. The hemorrhoids were       moderate.      The recto-sigmoid colon, sigmoid colon and descending colon were       moderately tortuous. Impression:               - The examined portion of the ileum was normal.                           - Three 4 to 8 mm polyps in the rectum, in the                            sigmoid colon and in the descending colon, removed                            with a cold snare. Resected and retrieved.                           -  MODERATE Diverticulosis in the recto-sigmoid                            colon, in the sigmoid colon, in the descending                            colon, in the ascending colon and in the cecum.                           - HEME POSITIVE STOOL DUE TO POLYPS and internal                            hemorrhoids.                           - Tortuous LEFT colon. Moderate Sedation:      Per Anesthesia Care Recommendation:           - Patient has a contact number available for                            emergencies. The signs and symptoms of potential                            delayed complications were discussed with the                            patient. Return to normal activities tomorrow.                            Written discharge instructions were provided to the                            patient.                           -  High fiber diet and low fat diet.                           - Continue present medications.                           - Await pathology results.                           - Repeat colonoscopy is not recommended due to                            current age (31 years or older) for surveillance.                            FLEX SIG IN 3 YRS TO ASSESS RECTAL POLYPECTOMY SITE.                           - Return to GI office in 6 months. Procedure Code(s):        --- Professional ---                           [phone], Colonoscopy, flexible; with removal of                            tumor(s), polyp(s), or other lesion(s) by snare                            technique Diagnosis Code(s):        --- Professional ---                           K64.8, Other hemorrhoids                           K62.1, Rectal polyp                           K63.5, Polyp of colon                           R19.5, Other fecal abnormalities                           K57.30, Diverticulosis of large intestine without                            perforation or abscess without bleeding                            Q43.8, Other specified congenital malformations of                            intestine CPT copyright 2019 [employer]. All rights reserved. The codes documented in this report are preliminary and upon coder review may  be revised to meet current compliance requirements. Barney Drain,  MD Barney Drain MD, MD 12/30/2018 9:22:21 AM This report has been signed electronically. Number of Addenda: 0

## 2018-12-30 NOTE — Progress Notes (Signed)
cc'd to pcp 

## 2018-12-30 NOTE — Op Note (Signed)
[facility] Patient Name: Jeremy [name] Procedure Date: 12/30/2018 9:01 AM MRN: [id number] Date of Birth: February 04, 1946 Attending MD: Barney Drain MD, MD CSN: [id number] Age: 73 Admit Type: Outpatient Procedure:                Upper GI endoscopy WITH COLD FORCEPS BIOPSY Indications:              Anemia-Hb 10.2 MCV 93.2 Providers:                Barney Drain MD, MD, Otis Peak B. Sharon Seller, RN, Nelma Rothman, Technician Referring MD:             [name] MD, MD Medicines:                Propofol per Anesthesia Complications:            No immediate complications. Estimated Blood Loss:     Estimated blood loss was minimal. Procedure:                Pre-Anesthesia Assessment:                           - Prior to the procedure, a History and Physical                            was performed, and patient medications and                            allergies were reviewed. The patient's tolerance of                            previous anesthesia was also reviewed. The risks                            and benefits of the procedure and the sedation                            options and risks were discussed with the patient.                            All questions were answered, and informed consent                            was obtained. Prior Anticoagulants: The patient has                            taken no previous anticoagulant or antiplatelet                            agents. ASA Grade Assessment: II - A patient with                            mild systemic disease. After reviewing the risks  and benefits, the patient was deemed in                            satisfactory condition to undergo the procedure.                            After obtaining informed consent, the endoscope was                            passed under direct vision. Throughout the                            procedure, the patient's blood pressure, pulse, and                          oxygen saturations were monitored continuously. The                            [id number]-[id number] ([id number]) scope was introduced through the                            mouth, and advanced to the second part of duodenum.                            The upper GI endoscopy was accomplished without                            difficulty. The patient tolerated the procedure                            well. Scope In: 9:07:25 AM Scope Out: 9:12:14 AM Total Procedure Duration: 0 hours 4 minutes 49 seconds  Findings:      One benign-appearing, intrinsic moderate (circumferential scarring or       stenosis; an endoscope may pass) stenosis was found. This stenosis       measured 1.6 cm (inner diameter). The stenosis was traversed. NO       BARRETT'S ESOPHAGUS.      A medium-sized hiatal hernia was present.      Localized moderate inflammation characterized by congestion (edema) and       erythema was found in the entire examined stomach. Biopsies were taken       with a cold forceps for Helicobacter pylori testing.      Localized mild inflammation characterized by congestion (edema) and       erythema was found in the duodenal bulb.      A large non-bleeding diverticulum was found in the second portion of the       duodenum. Impression:               - Benign-appearing esophageal STRICTURE DUE TO                            UNCONTROLLED GERD.                           - Medium-sized hiatal hernia.                           -  HEME POSITIVE STOOLS DUE TO Gastritis/Duodenitis,                            POLYPS, AND INTERNAL HEMORRHOIDS.                           - Non-bleeding duodenal diverticulum. Moderate Sedation:      Per Anesthesia Care Recommendation:           - Patient has a contact number available for                            emergencies. The signs and symptoms of potential                            delayed complications were discussed with the                             patient. Return to normal activities tomorrow.                            Written discharge instructions were provided to the                            patient.                           - High fiber diet and low fat diet.                           - Continue present medications. ADD PROTONIX Qac                            BREAKFAST FOREVER.                           - Await pathology results.                           - Return to my office in 6 months. Procedure Code(s):        --- Professional ---                           [phone], Esophagogastroduodenoscopy, flexible,                            transoral; with biopsy, single or multiple Diagnosis Code(s):        --- Professional ---                           K22.2, Esophageal obstruction                           K44.9, Diaphragmatic hernia without obstruction or                            gangrene  K29.70, Gastritis, unspecified, without bleeding                           K29.80, Duodenitis without bleeding                           D64.9, Anemia, unspecified                           K57.10, Diverticulosis of small intestine without                            perforation or abscess without bleeding CPT copyright 2019 [employer]. All rights reserved. The codes documented in this report are preliminary and upon coder review may  be revised to meet current compliance requirements. Barney Drain, MD Barney Drain MD, MD 12/30/2018 9:33:04 AM This report has been signed electronically. Number of Addenda: 0

## 2018-12-30 NOTE — Transfer of Care (Signed)
Immediate Anesthesia Transfer of Care Note  Patient: Jeremy [name]  Procedure(s) Performed: COLONOSCOPY WITH PROPOFOL (N/A ) ESOPHAGOGASTRODUODENOSCOPY (EGD) WITH PROPOFOL (N/A ) POLYPECTOMY BIOPSY  Patient Location: PACU  Anesthesia Type:General  Level of Consciousness: awake  Airway & Oxygen Therapy: Patient Spontanous Breathing  Post-op Assessment: Report given to RN  Post vital signs: Reviewed and stable  Last Vitals:  Vitals Value Taken Time  BP 98/62 12/30/18 0922  Temp    Pulse 79 12/30/18 0923  Resp 10 12/30/18 0923  SpO2 93 % 12/30/18 0923  Vitals shown include unvalidated device data.  Last Pain:  Vitals:   12/30/18 0836  TempSrc:   PainSc: 0-No pain         Complications: No apparent anesthesia complications

## 2019-01-03 NOTE — Progress Notes (Signed)
Stacey, please nic for 3 years to have flex sig.

## 2019-01-03 NOTE — Progress Notes (Signed)
PT's wife, [name], is aware.

## 2019-01-06 ENCOUNTER — Encounter (HOSPITAL_COMMUNITY): Payer: Self-pay | Admitting: Gastroenterology

## 2019-01-06 NOTE — Progress Notes (Signed)
ON RECALL FOR FLEX-SIG

## 2019-01-31 ENCOUNTER — Other Ambulatory Visit (HOSPITAL_COMMUNITY): Payer: Medicare Other

## 2019-02-17 DIAGNOSIS — L570 Actinic keratosis: Secondary | ICD-10-CM | POA: Diagnosis not present

## 2019-02-17 DIAGNOSIS — Z85828 Personal history of other malignant neoplasm of skin: Secondary | ICD-10-CM | POA: Diagnosis not present

## 2019-02-17 DIAGNOSIS — K130 Diseases of lips: Secondary | ICD-10-CM | POA: Diagnosis not present

## 2019-02-17 DIAGNOSIS — Z23 Encounter for immunization: Secondary | ICD-10-CM | POA: Diagnosis not present

## 2019-03-05 DIAGNOSIS — I10 Essential (primary) hypertension: Secondary | ICD-10-CM | POA: Diagnosis not present

## 2019-03-05 DIAGNOSIS — E1165 Type 2 diabetes mellitus with hyperglycemia: Secondary | ICD-10-CM | POA: Diagnosis not present

## 2019-03-05 DIAGNOSIS — E782 Mixed hyperlipidemia: Secondary | ICD-10-CM | POA: Diagnosis not present

## 2019-03-25 DIAGNOSIS — E1165 Type 2 diabetes mellitus with hyperglycemia: Secondary | ICD-10-CM | POA: Diagnosis not present

## 2019-03-25 DIAGNOSIS — R5383 Other fatigue: Secondary | ICD-10-CM | POA: Diagnosis not present

## 2019-03-25 DIAGNOSIS — I10 Essential (primary) hypertension: Secondary | ICD-10-CM | POA: Diagnosis not present

## 2019-03-25 DIAGNOSIS — E7800 Pure hypercholesterolemia, unspecified: Secondary | ICD-10-CM | POA: Diagnosis not present

## 2019-03-25 DIAGNOSIS — D649 Anemia, unspecified: Secondary | ICD-10-CM | POA: Diagnosis not present

## 2019-03-25 DIAGNOSIS — E782 Mixed hyperlipidemia: Secondary | ICD-10-CM | POA: Diagnosis not present

## 2019-03-25 DIAGNOSIS — R0902 Hypoxemia: Secondary | ICD-10-CM | POA: Diagnosis not present

## 2019-03-25 DIAGNOSIS — D509 Iron deficiency anemia, unspecified: Secondary | ICD-10-CM | POA: Diagnosis not present

## 2019-03-31 DIAGNOSIS — Z6825 Body mass index (BMI) 25.0-25.9, adult: Secondary | ICD-10-CM | POA: Diagnosis not present

## 2019-03-31 DIAGNOSIS — Z1389 Encounter for screening for other disorder: Secondary | ICD-10-CM | POA: Diagnosis not present

## 2019-03-31 DIAGNOSIS — L4050 Arthropathic psoriasis, unspecified: Secondary | ICD-10-CM | POA: Diagnosis not present

## 2019-03-31 DIAGNOSIS — J439 Emphysema, unspecified: Secondary | ICD-10-CM | POA: Diagnosis not present

## 2019-03-31 DIAGNOSIS — I10 Essential (primary) hypertension: Secondary | ICD-10-CM | POA: Diagnosis not present

## 2019-03-31 DIAGNOSIS — E1165 Type 2 diabetes mellitus with hyperglycemia: Secondary | ICD-10-CM | POA: Diagnosis not present

## 2019-03-31 DIAGNOSIS — D509 Iron deficiency anemia, unspecified: Secondary | ICD-10-CM | POA: Diagnosis not present

## 2019-03-31 DIAGNOSIS — Z1331 Encounter for screening for depression: Secondary | ICD-10-CM | POA: Diagnosis not present

## 2019-04-04 DIAGNOSIS — I10 Essential (primary) hypertension: Secondary | ICD-10-CM | POA: Diagnosis not present

## 2019-04-04 DIAGNOSIS — E782 Mixed hyperlipidemia: Secondary | ICD-10-CM | POA: Diagnosis not present

## 2019-04-11 DIAGNOSIS — I10 Essential (primary) hypertension: Secondary | ICD-10-CM | POA: Diagnosis not present

## 2019-04-11 DIAGNOSIS — L4050 Arthropathic psoriasis, unspecified: Secondary | ICD-10-CM | POA: Diagnosis not present

## 2019-04-11 DIAGNOSIS — R634 Abnormal weight loss: Secondary | ICD-10-CM | POA: Diagnosis not present

## 2019-04-11 DIAGNOSIS — M1990 Unspecified osteoarthritis, unspecified site: Secondary | ICD-10-CM | POA: Diagnosis not present

## 2019-04-11 DIAGNOSIS — E119 Type 2 diabetes mellitus without complications: Secondary | ICD-10-CM | POA: Diagnosis not present

## 2019-04-11 DIAGNOSIS — E785 Hyperlipidemia, unspecified: Secondary | ICD-10-CM | POA: Diagnosis not present

## 2019-04-11 DIAGNOSIS — Z79899 Other long term (current) drug therapy: Secondary | ICD-10-CM | POA: Diagnosis not present

## 2019-05-09 DIAGNOSIS — M47816 Spondylosis without myelopathy or radiculopathy, lumbar region: Secondary | ICD-10-CM | POA: Diagnosis not present

## 2019-05-09 DIAGNOSIS — M47812 Spondylosis without myelopathy or radiculopathy, cervical region: Secondary | ICD-10-CM | POA: Diagnosis not present

## 2019-05-13 ENCOUNTER — Other Ambulatory Visit: Payer: Self-pay | Admitting: Neurological Surgery

## 2019-05-13 DIAGNOSIS — M47812 Spondylosis without myelopathy or radiculopathy, cervical region: Secondary | ICD-10-CM

## 2019-05-13 DIAGNOSIS — R29898 Other symptoms and signs involving the musculoskeletal system: Secondary | ICD-10-CM

## 2019-05-20 ENCOUNTER — Encounter: Payer: Self-pay | Admitting: Gastroenterology

## 2019-06-03 ENCOUNTER — Ambulatory Visit
Admission: RE | Admit: 2019-06-03 | Discharge: 2019-06-03 | Disposition: A | Discharge status: Home / Self Care | Payer: Medicare Other | Source: Ambulatory Visit | Attending: Neurological Surgery | Admitting: Neurological Surgery

## 2019-06-03 ENCOUNTER — Other Ambulatory Visit: Payer: Self-pay

## 2019-06-03 DIAGNOSIS — R29898 Other symptoms and signs involving the musculoskeletal system: Secondary | ICD-10-CM

## 2019-06-03 DIAGNOSIS — M47812 Spondylosis without myelopathy or radiculopathy, cervical region: Secondary | ICD-10-CM

## 2019-06-03 MED ORDER — GADOBENATE DIMEGLUMINE 529 MG/ML IV SOLN
18.0000 mL | Freq: Once | INTRAVENOUS | Status: AC | PRN
  Administered 2019-06-03: 18 mL via INTRAVENOUS

## 2019-06-10 DIAGNOSIS — J441 Chronic obstructive pulmonary disease with (acute) exacerbation: Secondary | ICD-10-CM | POA: Diagnosis not present

## 2019-06-10 DIAGNOSIS — R05 Cough: Secondary | ICD-10-CM | POA: Diagnosis not present

## 2019-06-11 DIAGNOSIS — M4802 Spinal stenosis, cervical region: Secondary | ICD-10-CM | POA: Diagnosis not present

## 2019-06-11 DIAGNOSIS — M47812 Spondylosis without myelopathy or radiculopathy, cervical region: Secondary | ICD-10-CM | POA: Diagnosis not present

## 2019-06-20 DIAGNOSIS — J441 Chronic obstructive pulmonary disease with (acute) exacerbation: Secondary | ICD-10-CM | POA: Diagnosis not present

## 2019-06-20 DIAGNOSIS — R05 Cough: Secondary | ICD-10-CM | POA: Diagnosis not present

## 2019-07-04 DIAGNOSIS — J441 Chronic obstructive pulmonary disease with (acute) exacerbation: Secondary | ICD-10-CM | POA: Diagnosis not present

## 2019-07-04 DIAGNOSIS — E1165 Type 2 diabetes mellitus with hyperglycemia: Secondary | ICD-10-CM | POA: Diagnosis not present

## 2019-07-21 DIAGNOSIS — L4050 Arthropathic psoriasis, unspecified: Secondary | ICD-10-CM | POA: Diagnosis not present

## 2019-07-21 DIAGNOSIS — E1165 Type 2 diabetes mellitus with hyperglycemia: Secondary | ICD-10-CM | POA: Diagnosis not present

## 2019-07-21 DIAGNOSIS — J439 Emphysema, unspecified: Secondary | ICD-10-CM | POA: Diagnosis not present

## 2019-07-21 DIAGNOSIS — I959 Hypotension, unspecified: Secondary | ICD-10-CM | POA: Diagnosis not present

## 2019-07-21 DIAGNOSIS — R5383 Other fatigue: Secondary | ICD-10-CM | POA: Diagnosis not present

## 2019-07-28 DIAGNOSIS — I959 Hypotension, unspecified: Secondary | ICD-10-CM | POA: Diagnosis not present

## 2019-07-28 DIAGNOSIS — I10 Essential (primary) hypertension: Secondary | ICD-10-CM | POA: Diagnosis not present

## 2019-07-28 DIAGNOSIS — E782 Mixed hyperlipidemia: Secondary | ICD-10-CM | POA: Diagnosis not present

## 2019-07-28 DIAGNOSIS — D509 Iron deficiency anemia, unspecified: Secondary | ICD-10-CM | POA: Diagnosis not present

## 2019-07-28 DIAGNOSIS — E1165 Type 2 diabetes mellitus with hyperglycemia: Secondary | ICD-10-CM | POA: Diagnosis not present

## 2019-07-30 DIAGNOSIS — Z23 Encounter for immunization: Secondary | ICD-10-CM | POA: Diagnosis not present

## 2019-07-31 DIAGNOSIS — R634 Abnormal weight loss: Secondary | ICD-10-CM | POA: Diagnosis not present

## 2019-07-31 DIAGNOSIS — E1165 Type 2 diabetes mellitus with hyperglycemia: Secondary | ICD-10-CM | POA: Diagnosis not present

## 2019-07-31 DIAGNOSIS — Z6822 Body mass index (BMI) 22.0-22.9, adult: Secondary | ICD-10-CM | POA: Diagnosis not present

## 2019-07-31 DIAGNOSIS — G959 Disease of spinal cord, unspecified: Secondary | ICD-10-CM | POA: Diagnosis not present

## 2019-07-31 DIAGNOSIS — J439 Emphysema, unspecified: Secondary | ICD-10-CM | POA: Diagnosis not present

## 2019-07-31 DIAGNOSIS — I10 Essential (primary) hypertension: Secondary | ICD-10-CM | POA: Diagnosis not present

## 2019-07-31 DIAGNOSIS — D509 Iron deficiency anemia, unspecified: Secondary | ICD-10-CM | POA: Diagnosis not present

## 2019-07-31 DIAGNOSIS — Z0000 Encounter for general adult medical examination without abnormal findings: Secondary | ICD-10-CM | POA: Diagnosis not present

## 2019-08-01 DIAGNOSIS — J449 Chronic obstructive pulmonary disease, unspecified: Secondary | ICD-10-CM | POA: Diagnosis not present

## 2019-08-01 DIAGNOSIS — E7849 Other hyperlipidemia: Secondary | ICD-10-CM | POA: Diagnosis not present

## 2019-08-04 ENCOUNTER — Encounter: Payer: Self-pay | Admitting: Gastroenterology

## 2019-08-04 DIAGNOSIS — M542 Cervicalgia: Secondary | ICD-10-CM | POA: Diagnosis not present

## 2019-08-04 DIAGNOSIS — L4050 Arthropathic psoriasis, unspecified: Secondary | ICD-10-CM | POA: Diagnosis not present

## 2019-08-04 DIAGNOSIS — E119 Type 2 diabetes mellitus without complications: Secondary | ICD-10-CM | POA: Diagnosis not present

## 2019-08-04 DIAGNOSIS — M549 Dorsalgia, unspecified: Secondary | ICD-10-CM | POA: Diagnosis not present

## 2019-08-04 DIAGNOSIS — Z79899 Other long term (current) drug therapy: Secondary | ICD-10-CM | POA: Diagnosis not present

## 2019-08-04 DIAGNOSIS — M1990 Unspecified osteoarthritis, unspecified site: Secondary | ICD-10-CM | POA: Diagnosis not present

## 2019-08-04 DIAGNOSIS — E785 Hyperlipidemia, unspecified: Secondary | ICD-10-CM | POA: Diagnosis not present

## 2019-08-04 DIAGNOSIS — R634 Abnormal weight loss: Secondary | ICD-10-CM | POA: Diagnosis not present

## 2019-08-04 DIAGNOSIS — I10 Essential (primary) hypertension: Secondary | ICD-10-CM | POA: Diagnosis not present

## 2019-08-06 ENCOUNTER — Encounter (HOSPITAL_COMMUNITY): Payer: Self-pay | Admitting: Emergency Medicine

## 2019-08-06 ENCOUNTER — Emergency Department (HOSPITAL_COMMUNITY): Payer: Medicare Other

## 2019-08-06 ENCOUNTER — Inpatient Hospital Stay (HOSPITAL_COMMUNITY)
Admission: EM | Admit: 2019-08-06 | Discharge: 2019-08-12 | DRG: 180 | Disposition: A | Discharge status: Home / Self Care | Payer: Medicare Other | Attending: Family Medicine | Admitting: Family Medicine

## 2019-08-06 ENCOUNTER — Other Ambulatory Visit: Payer: Self-pay

## 2019-08-06 DIAGNOSIS — R042 Hemoptysis: Secondary | ICD-10-CM | POA: Diagnosis present

## 2019-08-06 DIAGNOSIS — Z79899 Other long term (current) drug therapy: Secondary | ICD-10-CM | POA: Diagnosis not present

## 2019-08-06 DIAGNOSIS — J410 Simple chronic bronchitis: Secondary | ICD-10-CM | POA: Diagnosis not present

## 2019-08-06 DIAGNOSIS — Z7984 Long term (current) use of oral hypoglycemic drugs: Secondary | ICD-10-CM | POA: Diagnosis not present

## 2019-08-06 DIAGNOSIS — L4050 Arthropathic psoriasis, unspecified: Secondary | ICD-10-CM | POA: Diagnosis present

## 2019-08-06 DIAGNOSIS — Z6821 Body mass index (BMI) 21.0-21.9, adult: Secondary | ICD-10-CM | POA: Diagnosis not present

## 2019-08-06 DIAGNOSIS — Z7952 Long term (current) use of systemic steroids: Secondary | ICD-10-CM | POA: Diagnosis not present

## 2019-08-06 DIAGNOSIS — Z888 Allergy status to other drugs, medicaments and biological substances status: Secondary | ICD-10-CM | POA: Diagnosis not present

## 2019-08-06 DIAGNOSIS — J42 Unspecified chronic bronchitis: Secondary | ICD-10-CM | POA: Diagnosis not present

## 2019-08-06 DIAGNOSIS — D72829 Elevated white blood cell count, unspecified: Secondary | ICD-10-CM | POA: Diagnosis not present

## 2019-08-06 DIAGNOSIS — J440 Chronic obstructive pulmonary disease with acute lower respiratory infection: Secondary | ICD-10-CM | POA: Diagnosis present

## 2019-08-06 DIAGNOSIS — J441 Chronic obstructive pulmonary disease with (acute) exacerbation: Secondary | ICD-10-CM | POA: Diagnosis present

## 2019-08-06 DIAGNOSIS — Z833 Family history of diabetes mellitus: Secondary | ICD-10-CM

## 2019-08-06 DIAGNOSIS — I4891 Unspecified atrial fibrillation: Secondary | ICD-10-CM

## 2019-08-06 DIAGNOSIS — R739 Hyperglycemia, unspecified: Secondary | ICD-10-CM | POA: Diagnosis not present

## 2019-08-06 DIAGNOSIS — J449 Chronic obstructive pulmonary disease, unspecified: Secondary | ICD-10-CM | POA: Diagnosis present

## 2019-08-06 DIAGNOSIS — R0602 Shortness of breath: Secondary | ICD-10-CM | POA: Diagnosis not present

## 2019-08-06 DIAGNOSIS — I480 Paroxysmal atrial fibrillation: Secondary | ICD-10-CM | POA: Diagnosis present

## 2019-08-06 DIAGNOSIS — R634 Abnormal weight loss: Secondary | ICD-10-CM | POA: Diagnosis not present

## 2019-08-06 DIAGNOSIS — E119 Type 2 diabetes mellitus without complications: Secondary | ICD-10-CM

## 2019-08-06 DIAGNOSIS — J984 Other disorders of lung: Secondary | ICD-10-CM | POA: Diagnosis not present

## 2019-08-06 DIAGNOSIS — Z20822 Contact with and (suspected) exposure to covid-19: Secondary | ICD-10-CM | POA: Diagnosis present

## 2019-08-06 DIAGNOSIS — J189 Pneumonia, unspecified organism: Secondary | ICD-10-CM | POA: Diagnosis present

## 2019-08-06 DIAGNOSIS — E1165 Type 2 diabetes mellitus with hyperglycemia: Secondary | ICD-10-CM | POA: Diagnosis present

## 2019-08-06 DIAGNOSIS — C3490 Malignant neoplasm of unspecified part of unspecified bronchus or lung: Secondary | ICD-10-CM | POA: Diagnosis not present

## 2019-08-06 DIAGNOSIS — C3431 Malignant neoplasm of lower lobe, right bronchus or lung: Secondary | ICD-10-CM | POA: Diagnosis not present

## 2019-08-06 DIAGNOSIS — Z87891 Personal history of nicotine dependence: Secondary | ICD-10-CM

## 2019-08-06 DIAGNOSIS — E43 Unspecified severe protein-calorie malnutrition: Secondary | ICD-10-CM | POA: Diagnosis present

## 2019-08-06 DIAGNOSIS — R091 Pleurisy: Secondary | ICD-10-CM | POA: Diagnosis not present

## 2019-08-06 DIAGNOSIS — J90 Pleural effusion, not elsewhere classified: Secondary | ICD-10-CM | POA: Diagnosis present

## 2019-08-06 DIAGNOSIS — J431 Panlobular emphysema: Secondary | ICD-10-CM | POA: Diagnosis not present

## 2019-08-06 DIAGNOSIS — J9601 Acute respiratory failure with hypoxia: Secondary | ICD-10-CM | POA: Diagnosis not present

## 2019-08-06 DIAGNOSIS — R918 Other nonspecific abnormal finding of lung field: Secondary | ICD-10-CM | POA: Diagnosis present

## 2019-08-06 DIAGNOSIS — I10 Essential (primary) hypertension: Secondary | ICD-10-CM | POA: Diagnosis present

## 2019-08-06 DIAGNOSIS — J9621 Acute and chronic respiratory failure with hypoxia: Secondary | ICD-10-CM | POA: Diagnosis present

## 2019-08-06 DIAGNOSIS — Z794 Long term (current) use of insulin: Secondary | ICD-10-CM | POA: Diagnosis not present

## 2019-08-06 DIAGNOSIS — Z8249 Family history of ischemic heart disease and other diseases of the circulatory system: Secondary | ICD-10-CM

## 2019-08-06 DIAGNOSIS — K4090 Unilateral inguinal hernia, without obstruction or gangrene, not specified as recurrent: Secondary | ICD-10-CM | POA: Diagnosis not present

## 2019-08-06 DIAGNOSIS — C3491 Malignant neoplasm of unspecified part of right bronchus or lung: Secondary | ICD-10-CM | POA: Diagnosis not present

## 2019-08-06 DIAGNOSIS — D630 Anemia in neoplastic disease: Secondary | ICD-10-CM | POA: Diagnosis present

## 2019-08-06 DIAGNOSIS — Z7951 Long term (current) use of inhaled steroids: Secondary | ICD-10-CM

## 2019-08-06 LAB — CBC
HCT: 34 % — ABNORMAL LOW (ref 39.0–52.0)
Hemoglobin: 10.1 g/dL — ABNORMAL LOW (ref 13.0–17.0)
MCH: 27.4 pg (ref 26.0–34.0)
MCHC: 29.7 g/dL — ABNORMAL LOW (ref 30.0–36.0)
MCV: 92.4 fL (ref 80.0–100.0)
Platelets: 368 10*3/uL (ref 150–400)
RBC: 3.68 MIL/uL — ABNORMAL LOW (ref 4.22–5.81)
RDW: 17.8 % — ABNORMAL HIGH (ref 11.5–15.5)
WBC: 16.6 10*3/uL — ABNORMAL HIGH (ref 4.0–10.5)
nRBC: 0 % (ref 0.0–0.2)

## 2019-08-06 LAB — TROPONIN I (HIGH SENSITIVITY)
Troponin I (High Sensitivity): 9 ng/L (ref <18)
Troponin I (High Sensitivity): 8 ng/L (ref <18)

## 2019-08-06 LAB — BLOOD GAS, VENOUS
Acid-Base Excess: 6.4 mmol/L — ABNORMAL HIGH (ref 0.0–2.0)
Bicarbonate: 28.7 mmol/L — ABNORMAL HIGH (ref 20.0–28.0)
FIO2: 32
O2 Saturation: 31.6 %
Patient temperature: 36.6
pCO2, Ven: 48.1 mmHg (ref 44.0–60.0)
pH, Ven: 7.422 (ref 7.250–7.430)
pO2, Ven: <31 mmHg — CL (ref 32.0–45.0)

## 2019-08-06 LAB — COMPREHENSIVE METABOLIC PANEL
ALT: 25 U/L (ref 0–44)
AST: 15 U/L (ref 15–41)
Albumin: 3 g/dL — ABNORMAL LOW (ref 3.5–5.0)
Alkaline Phosphatase: 59 U/L (ref 38–126)
Anion gap: 11 (ref 5–15)
BUN: 21 mg/dL (ref 8–23)
CO2: 29 mmol/L (ref 22–32)
Calcium: 9.2 mg/dL (ref 8.9–10.3)
Chloride: 97 mmol/L — ABNORMAL LOW (ref 98–111)
Creatinine, Ser: 0.73 mg/dL (ref 0.61–1.24)
GFR calc Af Amer: >60 mL/min (ref >60)
GFR calc non Af Amer: >60 mL/min (ref >60)
Glucose, Bld: 157 mg/dL — ABNORMAL HIGH (ref 70–99)
Potassium: 4.3 mmol/L (ref 3.5–5.1)
Sodium: 137 mmol/L (ref 135–145)
Total Bilirubin: 0.6 mg/dL (ref 0.3–1.2)
Total Protein: 7.7 g/dL (ref 6.5–8.1)

## 2019-08-06 LAB — BRAIN NATRIURETIC PEPTIDE: B Natriuretic Peptide: 104 pg/mL — ABNORMAL HIGH (ref 0.0–100.0)

## 2019-08-06 LAB — POC SARS CORONAVIRUS 2 AG -  ED: SARS Coronavirus 2 Ag: NEGATIVE

## 2019-08-06 LAB — SARS CORONAVIRUS 2 (TAT 6-24 HRS): SARS Coronavirus 2: NEGATIVE

## 2019-08-06 MED ORDER — SODIUM CHLORIDE 0.9 % IV SOLN
3.0000 g | Freq: Four times a day (QID) | INTRAVENOUS | Status: DC
  Administered 2019-08-06 – 2019-08-09 (×12): 3 g via INTRAVENOUS
  Filled 2019-08-06: qty 8
  Filled 2019-08-06: qty 3
  Filled 2019-08-06: qty 8
  Filled 2019-08-06 (×5): qty 3
  Filled 2019-08-06: qty 8
  Filled 2019-08-06 (×3): qty 3
  Filled 2019-08-06: qty 8
  Filled 2019-08-06: qty 3
  Filled 2019-08-06: qty 8

## 2019-08-06 MED ORDER — SODIUM CHLORIDE 0.9 % IV SOLN
2.0000 g | Freq: Once | INTRAVENOUS | Status: AC
  Administered 2019-08-06: 2 g via INTRAVENOUS
  Filled 2019-08-06: qty 20

## 2019-08-06 MED ORDER — IOHEXOL 350 MG/ML SOLN
100.0000 mL | Freq: Once | INTRAVENOUS | Status: AC | PRN
  Administered 2019-08-06: 100 mL via INTRAVENOUS

## 2019-08-06 MED ORDER — SODIUM CHLORIDE 0.9 % IV SOLN
500.0000 mg | INTRAVENOUS | Status: DC
  Administered 2019-08-06: 500 mg via INTRAVENOUS
  Filled 2019-08-06 (×2): qty 500

## 2019-08-06 NOTE — ED Notes (Signed)
Family at bedside. 

## 2019-08-06 NOTE — H&P (Addendum)
History and Physical  Jeremy [name] LOV:[id number] DOB: [birth year] DOA: 08/06/2019  Referring physician: Sedonia Small MD   PCP: [name], MD   Pt coming from: Home   Chief Complaint: Hemoptysis   HPI: Jeremy [name] is a 74 y.o. male former smoker who quit in 1993 after 25 years reports that for the last year he has continued to lose weight and reports about 20 pounds unintentional weight loss has been coughing and having chills for past several days.  He became concerned when he started coughing up blood this morning.  He could taste the blood in the back of his mouth.  He has COPD, HTN, type 2 DM and  Psoriatic arthritis but reports that these conditions have been stable.   He denies having fever.  He reports generalized body aches.  He reports progressive shortness of breath worse over past couple of weeks.  He does not use oxygen at home.    He was evaluated in ED with initial concern for PE, COPD exacerbation and Covid 19 infection.  His POC covid test was negative.  CXR was concerning for possible pneumonia.  BNP was 104.0.  Troponin was 9.  WBC was 16.6.  Hg was 10.0.  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  Pt was hypoxic and tachypneic on arrival and placed on 3L [place] with improvement in oxygen saturation.  I spoke with Dr. Governor Rooks with PCCM who said that patient would have to be admitted to [facility] because there are no bronchoscopy services available at [facility] at this time.   The patient was started on IV antibiotics for presumed postobstructive pneumonia.    Review of Systems: All systems reviewed and apart from history of presenting illness, are negative.  Past Medical History:  Diagnosis Date  . COPD (chronic obstructive pulmonary disease) (HCC)    QUIT SMOKING 30 YRS AGO  . Diabetes ([place]) 2018  . HTN (hypertension)   . Psoriatic arthritis ([place]) 2015   Past Surgical History:    Procedure Laterality Date  . BACK SURGERY  1990  . BIOPSY  12/30/2018   Procedure: BIOPSY;  Surgeon: Danie Binder, MD;  Location: [facility];  Service: Endoscopy;;  gastric  . CATARACT EXTRACTION Bilateral   . CERVICAL SPINE SURGERY  1995  . COLONOSCOPY WITH PROPOFOL N/A 12/30/2018   Procedure: COLONOSCOPY WITH PROPOFOL;  Surgeon: Danie Binder, MD;  Location[facility];  Service: Endoscopy;  Laterality: N/A;  12:30pm  . ESOPHAGOGASTRODUODENOSCOPY (EGD) WITH PROPOFOL N/A 12/30/2018   Procedure: ESOPHAGOGASTRODUODENOSCOPY (EGD) WITH PROPOFOL;  Surgeon: Danie Binder, MD;  Location[facility];  Service: Endoscopy;  Laterality: N/A;  . POLYPECTOMY  12/30/2018   Procedure: POLYPECTOMY;  Surgeon: Danie Binder, MD;  Location[facility];  Service: Endoscopy;;  colon   Social History:  reports that he quit smoking about 27 years ago. His smoking use included cigarettes. He has a 25.00 pack-year smoking history. He has never used smokeless tobacco. He reports current alcohol use. He reports that he does not use drugs.  Allergies  Allergen Reactions  . Aleve [Naproxen Sodium] Hives and Rash    Family History  Problem Relation Age of Onset  . Alcoholism Father   . CAD Brother   . Diabetes Brother   . Colon cancer Neg Hx   . Colon polyps Neg Hx   .  Gastric cancer Neg Hx     Prior to Admission medications   Medication Sig Start Date End Date Taking? Authorizing Provider  acetaminophen (TYLENOL) 500 MG tablet Take 500 mg by mouth every 4 (four) hours as needed (for pain.).   Yes [provider]  albuterol (VENTOLIN HFA) 108 (90 Base) MCG/ACT inhaler Inhale 2 puffs into the lungs every 6 (six) hours as needed for wheezing or shortness of breath.   Yes [provider]  Ascorbic Acid (VITAMIN C) 1000 MG tablet Take 1,000 mg by mouth daily.   Yes [provider]  doxycycline (MONODOX) 50 MG capsule Take 50 mg by mouth 2 (two) times daily as needed  (rosacea flare ups.).   Yes [provider]  ferrous sulfate 325 (65 FE) MG tablet Take 325 mg by mouth daily with supper.   Yes [provider]  FLOVENT HFA 110 MCG/ACT inhaler 1 puff 2 (two) times daily. 06/20/19  Yes [provider]  folic acid (FOLVITE) 1 MG tablet Take 1 mg by mouth daily.   Yes [provider]  gabapentin (NEURONTIN) 300 MG capsule Take 600 mg by mouth at bedtime.   Yes [provider]  lisinopril (ZESTRIL) 20 MG tablet Take 10 mg by mouth daily.  10/01/18  Yes [provider]  metFORMIN (GLUCOPHAGE-XR) 500 MG 24 hr tablet Take 500-1,000 mg by mouth See admin instructions. Take 2 tablets (1000 mg) by mouth in the morning & 1 tablet (500 mg) by mouth at night. 10/15/18  Yes [provider]  metroNIDAZOLE (METROCREAM) 0.75 % cream Apply 1 application topically 2 (two) times daily as needed (facial rosacea).    Yes [provider]  mometasone (ELOCON) 0.1 % cream Apply 1 application topically 2 (two) times daily as needed (psoriasis).    Yes [provider]  Omega-3 Fatty Acids (FISH OIL) 1000 MG CAPS Take 1,000 mg by mouth daily.    Yes [provider]  pantoprazole (PROTONIX) 40 MG tablet 1 po 30 mins prior to first meal 12/30/18  Yes Fields, Sandi L, MD  predniSONE (DELTASONE) 5 MG tablet Take 5 mg by mouth daily with breakfast.   Yes [provider]  traMADol (ULTRAM) 50 MG tablet Take 50 mg by mouth every 4 (four) hours as needed. for pain 08/19/18  Yes [provider]  cyclobenzaprine (FLEXERIL) 10 MG tablet Take 10 mg by mouth at bedtime. 09/13/18   [provider]  methotrexate 50 MG/2ML injection Inject 17.5 mg into the muscle every Friday.  09/16/18   [provider]   Physical Exam: Vitals:   08/06/19 1300 08/06/19 1315 08/06/19 1330 08/06/19 1414  BP: 140/76  123/63 (!) 141/83  Pulse: (!) 101 95 95 94  Resp: (!) 9  18 (!) 22  Temp:      TempSrc:       SpO2: 93% 97% 95% 96%  Weight:      Height:         General exam: thin emaciated appearing male, oriented x 3, lying comfortably supine on the gurney in no obvious distress.  Head, eyes and ENT: Nontraumatic and normocephalic. Pupils equally reacting to light and accommodation. Oral mucosa moist.  Neck: Supple. No JVD, carotid bruit or thyromegaly.  Lymphatics: No lymphadenopathy.  Respiratory system: diminished BS RLL. Mild increased work of breathing.  Cardiovascular system: S1 and S2 heard, RRR. No JVD, murmurs, gallops, clicks or pedal edema.  Gastrointestinal system: Abdomen is nondistended, soft and nontender.  Normal bowel sounds heard. No organomegaly or masses appreciated.  Central nervous system: Alert and oriented. No focal neurological deficits.  Extremities: Symmetric 5 x 5 power. Peripheral pulses symmetrically felt.   Skin: No rashes or acute findings.  Musculoskeletal system: Negative exam.  Psychiatry: Pleasant and cooperative.  Labs on Admission:  Basic Metabolic Panel: Recent Labs  Lab 08/06/19 1211  NA 137  K 4.3  CL 97*  CO2 29  GLUCOSE 157*  BUN 21  CREATININE 0.73  CALCIUM 9.2   Liver Function Tests: Recent Labs  Lab 08/06/19 1211  AST 15  ALT 25  ALKPHOS 59  BILITOT 0.6  PROT 7.7  ALBUMIN 3.0*   No results for input(s): LIPASE, AMYLASE in the last 168 hours. No results for input(s): AMMONIA in the last 168 hours. CBC: Recent Labs  Lab 08/06/19 1211  WBC 16.6*  HGB 10.1*  HCT 34.0*  MCV 92.4  PLT 368   Cardiac Enzymes: No results for input(s): CKTOTAL, CKMB, CKMBINDEX, TROPONINI in the last 168 hours.  BNP (last 3 results) No results for input(s): PROBNP in the last 8760 hours. CBG: No results for input(s): GLUCAP in the last 168 hours.  Radiological Exams on Admission: CT ANGIO CHEST PE W OR WO CONTRAST  Result Date: 08/06/2019 CLINICAL DATA:  Persistent shortness of breath, hemoptysis, hypoxia. EXAM: CT  ANGIOGRAPHY CHEST WITH CONTRAST TECHNIQUE: Multidetector CT imaging of the chest was performed using the standard protocol during bolus administration of intravenous contrast. Multiplanar CT image reconstructions and MIPs were obtained to evaluate the vascular anatomy. CONTRAST:  146mL OMNIPAQUE IOHEXOL 350 MG/ML SOLN COMPARISON:  Chest radiograph 08/06/2019. FINDINGS: Cardiovascular: Negative for pulmonary embolus. Atherosclerotic calcification of the aorta and coronary arteries. Heart size normal. No pericardial effusion. Mediastinum/Nodes: Mediastinal lymph nodes measure up to 8 mm in the high right paratracheal station. Probable 1.5 cm subcarinal lymph node. 10 mm right hilar lymph node. No left hilar or axillary lymph nodes. Prepericardiac lymph nodes are not enlarged by CT size criteria. Esophagus is grossly unremarkable. Lungs/Pleura: A cavitary mass in the right lower lobe extends 2 and involves the carina and measures approximately 6.5 x 9.9 cm. There is an air-fluid level within. Complete obstruction of the right lower lobe with collapse/consolidation and necrosis in the right lower lobe. Near complete obstruction of the right upper and middle lobe bronchi with extensive perilymphatic nodularity and mild septal thickening in the aerated right upper and middle lobes. Moderate right pleural effusion. Mild centrilobular emphysema. Left lung is clear. Debris is seen in the proximal left mainstem bronchus, in addition to the right bronchial tree, as discussed previously. Upper Abdomen: Visualized portions of the liver, adrenal glands, left kidney, spleen, pancreas, stomach and bowel are grossly unremarkable. No upper abdominal adenopathy. Musculoskeletal: Degenerative changes in the spine. Review of the MIP images confirms the above findings. IMPRESSION: 1. Negative for pulmonary embolus. 2. Large necrotic mass extends to the carina with associated obstruction of the right lower lobe bronchus. Postobstructive  collapse/consolidation and necrosis in the right lower lobe. 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 3. 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 4. Borderline right paratracheal and subcarinal lymph nodes, likely metastatic. 5. Aortic atherosclerosis (ICD10-I70.0). Coronary artery calcification. Electronically Signed   By: Lorin Picket M.D.   On: 08/06/2019 13:40   DG Chest Port 1 View  Result Date: 08/06/2019 CLINICAL DATA:  Shortness of breath EXAM: PORTABLE CHEST 1 VIEW COMPARISON:  06/20/2019 FINDINGS: Volume loss on the right. Airspace disease throughout the right lung concerning for pneumonia. Small right pleural effusion. Left lung clear. Heart is normal size. No acute bony abnormality. IMPRESSION: Airspace disease throughout the right lung with small right effusion. Findings concerning for pneumonia. Volume loss on the right. Electronically Signed   By: Rolm Baptise M.D.   On: 08/06/2019 12:26    EKG: Independently reviewed. Sinus tachycardia   Assessment/Plan Active Problems:   Hemoptysis   Mass of right lung   Former smoker   COPD (chronic obstructive pulmonary disease) (HCC)   Diabetes (HCC)   HTN (hypertension)   Abnormal weight loss   Leukocytosis   Acute and chronic respiratory failure with hypoxia (HCC)   Anemia in neoplastic disease   Hyperglycemia   Cavitating mass in right lower lung lobe   Postobstructive pneumonia  1. Acute on chronic respiratory failure with hypoxia - multifactorial with new findings of a large cavitary RLL mass, postobtructive pneumonia - Admit for supportive care, continue oxygen, I spoke with pulmonologist Dr. Halford Chessman who requested admission to [facility] due to not having bronchoscopy services available at [facility].  2. 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  Pt will need bronchoscopy for tissue  diagnosis.  I spoke with Dr. Halford Chessman.  Pt being admitted to [facility].  Pt likely will need oncology consult when tissue diagnosis available.   3. Abnormal weight loss - likely secondary to lung cancer.  Consult to dietitian.   4. Anemia in neoplastic disease - Follow CBC closely.  5. Hemoptysis - secondary to lung mass.  Monitor closely.   6. Essential hypertension - BP well controlled follow.  7. COPD - bronchodilators as needed.  8. Postobstructive pneumonia - IV Unasyn ordered. 9. Leukocytosis - multifactorial , recheck CBC in AM with diff.   DVT Prophylaxis: SCDs Code Status: Full   Family Communication: wife updated by telephone  Disposition Plan: admit to [facility] for PCCM consult, bronchoscopy, oncology consultation    Time spent: 13 minutes   Mattelyn Imhoff Wynetta Emery, MD [facility] How to contact the [facility] Attending or Consulting provider [place] or covering provider during after hours [name], for this patient?  1. Check the care team in [facility] and look for a) attending/consulting [facility] provider listed and b) the [facility] team listed 2. Log into [email or web address] and use [facility] universal password to access. If you do not have the password, please contact the hospital operator. 3. Locate the [facility] provider you are looking for under [facility] and page to a number that you can be directly reached. 4. If you still have difficulty reaching the provider, please page the [facility] (Director on Call) for the Hospitalists listed on amion for assistance.

## 2019-08-06 NOTE — ED Notes (Signed)
Pt given urinal and assisted to side of bed; pt helped back in bed and lights cut off for patient comfort; pt informed of wait for [facility] transport

## 2019-08-06 NOTE — ED Provider Notes (Signed)
[facility] Emergency Department Provider Note MRN:  [id number]  Arrival date & time: 08/06/19     Chief Complaint   Hemoptysis   History of Present Illness   Jeremy [name] is a 74 y.o. year-old male with a history of hypertension, COPD, psoriatic arthritis presenting to the ED with chief complaint of hemoptysis.  Gradually worsening shortness of breath for the past few days.  Hemoptysis this morning.  Denies chest pain.  Recent weight loss.  Generalized aches and pains.  Endorsing chills but no fever.  Chronic cough is unchanged.  No abdominal pain.  No leg pain or swelling.  Review of Systems  A complete 10 system review of systems was obtained and all systems are negative except as noted in the HPI and PMH.   Patient's Health History    Past Medical History:  Diagnosis Date  . COPD (chronic obstructive pulmonary disease) (HCC)    QUIT SMOKING 30 YRS AGO  . Diabetes ([place]) 2018  . HTN (hypertension)   . Psoriatic arthritis ([place]) 2015    Past Surgical History:  Procedure Laterality Date  . BACK SURGERY  1990  . BIOPSY  12/30/2018   Procedure: BIOPSY;  Surgeon: Danie Binder, MD;  Location[facility];  Service: Endoscopy;;  gastric  . CATARACT EXTRACTION Bilateral   . CERVICAL SPINE SURGERY  1995  . COLONOSCOPY WITH PROPOFOL N/A 12/30/2018   Procedure: COLONOSCOPY WITH PROPOFOL;  Surgeon: Danie Binder, MD;  Location[facility];  Service: Endoscopy;  Laterality: N/A;  12:30pm  . ESOPHAGOGASTRODUODENOSCOPY (EGD) WITH PROPOFOL N/A 12/30/2018   Procedure: ESOPHAGOGASTRODUODENOSCOPY (EGD) WITH PROPOFOL;  Surgeon: Danie Binder, MD;  Location: [facility];  Service: Endoscopy;  Laterality: N/A;  . POLYPECTOMY  12/30/2018   Procedure: POLYPECTOMY;  Surgeon: Danie Binder, MD;  Location: [facility];  Service: Endoscopy;;  colon    Family History  Problem Relation Age of Onset  . Alcoholism Father   . CAD Brother   . Diabetes Brother   .  Colon cancer Neg Hx   . Colon polyps Neg Hx   . Gastric cancer Neg Hx     Social History   Socioeconomic History  . Marital status: Married    Spouse name: Not on file  . Number of children: Not on file  . Years of education: Not on file  . Highest education level: Not on file  Occupational History  . Not on file  Tobacco Use  . Smoking status: Former Smoker    Packs/day: 0.50    Years: 50.00    Pack years: 25.00    Types: Cigarettes    Quit date: 12/26/1991    Years since quitting: 27.6  . Smokeless tobacco: Never Used  Substance and Sexual Activity  . Alcohol use: Yes    Comment: occ beer  . Drug use: Never  . Sexual activity: Yes    Birth control/protection: None  Other Topics Concern  . 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 [id number]([id number]). 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   Social Determinants of Health   Financial Resource Strain:   . Difficulty of Paying Living Expenses: Not on file  Food Insecurity:   . Worried About Charity fundraiser in the Last Year: Not on file  . Ran Out of Food in the Last Year: Not on file  Transportation Needs:   .  Lack of Transportation (Medical): Not on file  . Lack of Transportation (Non-Medical): Not on file  Physical Activity:   . Days of Exercise per Week: Not on file  . Minutes of Exercise per Session: Not on file  Stress:   . Feeling of Stress : Not on file  Social Connections:   . Frequency of Communication with Friends and Family: Not on file  . Frequency of Social Gatherings with Friends and Family: Not on file  . Attends Religious Services: Not on file  . Active Member of Clubs or Organizations: Not on file  . Attends Archivist Meetings: Not on file  . Marital Status: Not on file  Intimate Partner Violence:   . Fear of Current or Ex-Partner: Not on file  . Emotionally Abused: Not on file  . Physically Abused: Not on file  .  Sexually Abused: Not on file     Physical Exam   Vitals:   08/06/19 1315 08/06/19 1330  BP:  123/63  Pulse: 95 95  Resp:  18  Temp:    SpO2: 97% 95%    CONSTITUTIONAL: Chronically ill-appearing, NAD NEURO:  Alert and oriented x 3, no focal deficits EYES:  eyes equal and reactive ENT/NECK:  no LAD, no JVD CARDIO: Tachycardic rate, well-perfused, normal S1 and S2 PULM:  CTAB no wheezing or rhonchi, tachypneic GI/GU:  normal bowel sounds, non-distended, non-tender MSK/SPINE:  No gross deformities, no edema SKIN:  no rash, atraumatic PSYCH:  Appropriate speech and behavior  *Additional and/or pertinent findings included in MDM below  Diagnostic and Interventional Summary    EKG Interpretation  Date/Time:  Wednesday August 06 2019 11:25:35 EST Ventricular Rate:  102 PR Interval:    QRS Duration: 113 QT Interval:  340 QTC Calculation: 443 R Axis:   15 Text Interpretation: Sinus tachycardia Ventricular premature complex Borderline intraventricular conduction delay Low voltage, extremity leads Repol abnrm suggests ischemia, inferior leads Minimal ST elevation, lateral leads Confirmed by Gerlene Fee [phone]) on 08/06/2019 11:51:28 AM      Cardiac Monitoring Interpretation:  Labs Reviewed  BRAIN NATRIURETIC PEPTIDE - Abnormal; Notable for the following components:      Result Value   B Natriuretic Peptide 104.0 (*)    All other components within normal limits  CBC - Abnormal; Notable for the following components:   WBC 16.6 (*)    RBC 3.68 (*)    Hemoglobin 10.1 (*)    HCT 34.0 (*)    MCHC 29.7 (*)    RDW 17.8 (*)    All other components within normal limits  COMPREHENSIVE METABOLIC PANEL - Abnormal; Notable for the following components:   Chloride 97 (*)    Glucose, Bld 157 (*)    Albumin 3.0 (*)    All other components within normal limits  CULTURE, BLOOD (SINGLE)  SARS CORONAVIRUS 2 (TAT 6-24 HRS)  BLOOD GAS, VENOUS  POC SARS CORONAVIRUS 2 AG -  ED  TROPONIN I  (HIGH SENSITIVITY)  TROPONIN I (HIGH SENSITIVITY)    CT ANGIO CHEST PE W OR WO CONTRAST  Final Result    DG Chest Port 1 View  Final Result      Medications  cefTRIAXone (ROCEPHIN) 2 g in sodium chloride 0.9 % 100 mL IVPB (2 g Intravenous New Bag/Given 08/06/19 1401)  azithromycin (ZITHROMAX) 500 mg in sodium chloride 0.9 % 250 mL IVPB (has no administration in time range)  iohexol (OMNIPAQUE) 350 MG/ML injection 100 mL (100 mLs Intravenous Contrast Given  08/06/19 1300)     Procedures  /  Critical Care .Critical Care Performed by: Maudie Flakes, MD Authorized by: Maudie Flakes, MD   Critical care provider statement:    Critical care time (minutes):  34   Critical care was necessary to treat or prevent imminent or life-threatening deterioration of the following conditions:  Respiratory failure   Critical care was time spent personally by me on the following activities:  Discussions with consultants, evaluation of patient's response to treatment, examination of patient, ordering and performing treatments and interventions, ordering and review of laboratory studies, ordering and review of radiographic studies, pulse oximetry, re-evaluation of patient's condition, obtaining history from patient or surrogate and review of old charts    ED Course and Medical Decision Making  I have reviewed the triage vital signs, the nursing notes, and pertinent available records from the EMR.  Pertinent labs & imaging results that were available during my care of the patient were reviewed by me and considered in my medical decision making (see below for details).     Concern for PE, COPD exacerbation, pneumonia, COVID-19, underlying malignancy.  New oxygen requirement, tachypneic, will likely need admission.  2 PM update: X-ray was concerning for possible pneumonia, given empiric community-acquired pneumonia coverage.  CT is negative for PE but does demonstrate a necrotic mass concerning for  bronchogenic carcinoma.  This news given to patient and patient's wife.  Patient continues to require 3 L nasal cannula, continues to be tachypneic despite this.  Will admit to hospitalist service for further care.  Barth Kirks. Sedonia Small, MD [place] [email or web address]  Final Clinical Impressions(s) / ED Diagnoses     ICD-10-CM   1. Hemoptysis  R04.2   2. Lung mass  R91.8   3. Acute respiratory failure with hypoxia (HCC)  J96.01     ED Discharge Orders    None       Discharge Instructions Discussed with and Provided to Patient:   Discharge Instructions   None       Maudie Flakes, MD 08/06/19 1420

## 2019-08-06 NOTE — ED Triage Notes (Signed)
PT brought to ED today by his wife for continued SOB over the past month and started coughing up bright red blood this am. Pt's wife states his PCP at [facility] in [place] is referring him to Oncology for a hematology workup. PT sats 88% on room air upon arrival to ED and pt denies any home oxygen use.

## 2019-08-06 NOTE — Progress Notes (Signed)
Pharmacy Antibiotic Note  Bernard Slayden is a 74 y.o. male admitted on 08/06/2019 with post-obstructive pneumonia.  Pharmacy has been consulted for Unasyn dosing.  Plan: Start Unasyn 3g IV q6h  Pharmacy will continue to monitor renal function, cultures and patient progress.   Height: 6\' 2"  (188 cm) Weight: 195 lb 12.3 oz (88.8 kg) IBW/kg (Calculated) : 82.2  Temp (24hrs), Avg:97.9 F (36.6 C), Min:97.9 F (36.6 C), Max:97.9 F (36.6 C)  Recent Labs  Lab 08/06/19 1211  WBC 16.6*  CREATININE 0.73    Estimated Creatinine Clearance: 92.8 mL/min (by C-G formula based on SCr of 0.73 mg/dL).    Allergies  Allergen Reactions  . Aleve [Naproxen Sodium] Hives and Rash    Antimicrobials this admission: Unasyn 3/3>>  azithromycin 3/3>>  ceftriaxone 3/3>>3/3   Microbiology results: 3/3 BC x2:   3/3 Resp PCR:          SARS CoV-2:                Flu A/B:      Thank you for allowing pharmacy to be a part of this patient's care.  Despina Pole 08/06/2019 5:04 PM

## 2019-08-07 DIAGNOSIS — E1165 Type 2 diabetes mellitus with hyperglycemia: Secondary | ICD-10-CM

## 2019-08-07 DIAGNOSIS — R634 Abnormal weight loss: Secondary | ICD-10-CM

## 2019-08-07 DIAGNOSIS — J410 Simple chronic bronchitis: Secondary | ICD-10-CM

## 2019-08-07 DIAGNOSIS — J189 Pneumonia, unspecified organism: Secondary | ICD-10-CM

## 2019-08-07 DIAGNOSIS — J441 Chronic obstructive pulmonary disease with (acute) exacerbation: Secondary | ICD-10-CM

## 2019-08-07 DIAGNOSIS — R918 Other nonspecific abnormal finding of lung field: Secondary | ICD-10-CM

## 2019-08-07 DIAGNOSIS — E43 Unspecified severe protein-calorie malnutrition: Secondary | ICD-10-CM | POA: Insufficient documentation

## 2019-08-07 LAB — GLUCOSE, CAPILLARY: Glucose-Capillary: 132 mg/dL — ABNORMAL HIGH (ref 70–99)

## 2019-08-07 LAB — CBC
HCT: 29.9 % — ABNORMAL LOW (ref 39.0–52.0)
Hemoglobin: 9.1 g/dL — ABNORMAL LOW (ref 13.0–17.0)
MCH: 27.3 pg (ref 26.0–34.0)
MCHC: 30.4 g/dL (ref 30.0–36.0)
MCV: 89.8 fL (ref 80.0–100.0)
Platelets: 290 10*3/uL (ref 150–400)
RBC: 3.33 MIL/uL — ABNORMAL LOW (ref 4.22–5.81)
RDW: 17.7 % — ABNORMAL HIGH (ref 11.5–15.5)
WBC: 15 10*3/uL — ABNORMAL HIGH (ref 4.0–10.5)
nRBC: 0 % (ref 0.0–0.2)

## 2019-08-07 LAB — COMPREHENSIVE METABOLIC PANEL
ALT: 22 U/L (ref 0–44)
AST: 15 U/L (ref 15–41)
Albumin: 2.2 g/dL — ABNORMAL LOW (ref 3.5–5.0)
Alkaline Phosphatase: 54 U/L (ref 38–126)
Anion gap: 11 (ref 5–15)
BUN: 16 mg/dL (ref 8–23)
CO2: 29 mmol/L (ref 22–32)
Calcium: 8.7 mg/dL — ABNORMAL LOW (ref 8.9–10.3)
Chloride: 97 mmol/L — ABNORMAL LOW (ref 98–111)
Creatinine, Ser: 0.95 mg/dL (ref 0.61–1.24)
GFR calc Af Amer: >60 mL/min (ref >60)
GFR calc non Af Amer: >60 mL/min (ref >60)
Glucose, Bld: 209 mg/dL — ABNORMAL HIGH (ref 70–99)
Potassium: 5.1 mmol/L (ref 3.5–5.1)
Sodium: 137 mmol/L (ref 135–145)
Total Bilirubin: 0.5 mg/dL (ref 0.3–1.2)
Total Protein: 6.2 g/dL — ABNORMAL LOW (ref 6.5–8.1)

## 2019-08-07 LAB — CBC WITH DIFFERENTIAL/PLATELET
Abs Immature Granulocytes: 0.18 10*3/uL — ABNORMAL HIGH (ref 0.00–0.07)
Basophils Absolute: 0 10*3/uL (ref 0.0–0.1)
Basophils Relative: 0 %
Eosinophils Absolute: 0.1 10*3/uL (ref 0.0–0.5)
Eosinophils Relative: 0 %
HCT: 33.8 % — ABNORMAL LOW (ref 39.0–52.0)
Hemoglobin: 10.3 g/dL — ABNORMAL LOW (ref 13.0–17.0)
Immature Granulocytes: 1 %
Lymphocytes Relative: 8 %
Lymphs Abs: 1.5 10*3/uL (ref 0.7–4.0)
MCH: 27.3 pg (ref 26.0–34.0)
MCHC: 30.5 g/dL (ref 30.0–36.0)
MCV: 89.7 fL (ref 80.0–100.0)
Monocytes Absolute: 0.8 10*3/uL (ref 0.1–1.0)
Monocytes Relative: 4 %
Neutro Abs: 17.5 10*3/uL — ABNORMAL HIGH (ref 1.7–7.7)
Neutrophils Relative %: 87 %
Platelets: 333 10*3/uL (ref 150–400)
RBC: 3.77 MIL/uL — ABNORMAL LOW (ref 4.22–5.81)
RDW: 17.6 % — ABNORMAL HIGH (ref 11.5–15.5)
WBC: 20.1 10*3/uL — ABNORMAL HIGH (ref 4.0–10.5)
nRBC: 0 % (ref 0.0–0.2)

## 2019-08-07 LAB — ABO/RH: ABO/RH(D): A POS

## 2019-08-07 LAB — TYPE AND SCREEN
ABO/RH(D): A POS
Antibody Screen: NEGATIVE

## 2019-08-07 LAB — HEMOGLOBIN A1C
Hgb A1c MFr Bld: 7 % — ABNORMAL HIGH (ref 4.8–5.6)
Mean Plasma Glucose: 154.2 mg/dL

## 2019-08-07 LAB — PROTIME-INR
INR: 1.1 (ref 0.8–1.2)
Prothrombin Time: 14.4 seconds (ref 11.4–15.2)

## 2019-08-07 LAB — MAGNESIUM: Magnesium: 1.6 mg/dL — ABNORMAL LOW (ref 1.7–2.4)

## 2019-08-07 LAB — APTT: aPTT: 33 seconds (ref 24–36)

## 2019-08-07 MED ORDER — PREDNISONE 50 MG PO TABS
50.0000 mg | ORAL_TABLET | Freq: Every day | ORAL | Status: DC
  Administered 2019-08-08 – 2019-08-09 (×2): 50 mg via ORAL
  Filled 2019-08-07 (×2): qty 1

## 2019-08-07 MED ORDER — PANTOPRAZOLE SODIUM 40 MG PO TBEC
40.0000 mg | DELAYED_RELEASE_TABLET | Freq: Every day | ORAL | Status: DC
  Administered 2019-08-07 – 2019-08-12 (×6): 40 mg via ORAL
  Filled 2019-08-07 (×6): qty 1

## 2019-08-07 MED ORDER — LISINOPRIL 10 MG PO TABS
10.0000 mg | ORAL_TABLET | Freq: Every day | ORAL | Status: DC
  Administered 2019-08-07 – 2019-08-08 (×2): 10 mg via ORAL
  Filled 2019-08-07 (×2): qty 1

## 2019-08-07 MED ORDER — SODIUM CHLORIDE 0.9 % IV SOLN
250.0000 mL | INTRAVENOUS | Status: DC | PRN
  Administered 2019-08-07: 500 mL via INTRAVENOUS
  Administered 2019-08-07 – 2019-08-08 (×2): 250 mL via INTRAVENOUS

## 2019-08-07 MED ORDER — FOLIC ACID 1 MG PO TABS
1.0000 mg | ORAL_TABLET | Freq: Every day | ORAL | Status: DC
  Administered 2019-08-07 – 2019-08-12 (×6): 1 mg via ORAL
  Filled 2019-08-07 (×6): qty 1

## 2019-08-07 MED ORDER — ALBUTEROL SULFATE (2.5 MG/3ML) 0.083% IN NEBU: 2.5000 mg | INHALATION_SOLUTION | Freq: Four times a day (QID) | RESPIRATORY_TRACT | Status: DC | PRN

## 2019-08-07 MED ORDER — ACETAMINOPHEN 650 MG RE SUPP: 650.0000 mg | Freq: Four times a day (QID) | RECTAL | Status: DC | PRN

## 2019-08-07 MED ORDER — ENSURE ENLIVE PO LIQD
237.0000 mL | Freq: Three times a day (TID) | ORAL | Status: DC
  Administered 2019-08-07 – 2019-08-12 (×7): 237 mL via ORAL

## 2019-08-07 MED ORDER — MAGNESIUM SULFATE 2 GM/50ML IV SOLN
2.0000 g | Freq: Once | INTRAVENOUS | Status: AC
  Administered 2019-08-07: 2 g via INTRAVENOUS
  Filled 2019-08-07: qty 50

## 2019-08-07 MED ORDER — ACETAMINOPHEN 325 MG PO TABS
650.0000 mg | ORAL_TABLET | Freq: Four times a day (QID) | ORAL | Status: DC | PRN
  Administered 2019-08-08 – 2019-08-10 (×2): 650 mg via ORAL
  Filled 2019-08-07 (×2): qty 2

## 2019-08-07 MED ORDER — ONDANSETRON HCL 4 MG/2ML IJ SOLN: 4.0000 mg | Freq: Four times a day (QID) | INTRAMUSCULAR | Status: DC | PRN

## 2019-08-07 MED ORDER — UMECLIDINIUM-VILANTEROL 62.5-25 MCG/INH IN AEPB
1.0000 | INHALATION_SPRAY | Freq: Every day | RESPIRATORY_TRACT | Status: DC
  Administered 2019-08-08 – 2019-08-12 (×5): 1 via RESPIRATORY_TRACT
  Filled 2019-08-07: qty 14

## 2019-08-07 MED ORDER — SODIUM CHLORIDE 0.9% FLUSH: 3.0000 mL | INTRAVENOUS | Status: DC | PRN

## 2019-08-07 MED ORDER — ENSURE ENLIVE PO LIQD
237.0000 mL | Freq: Two times a day (BID) | ORAL | Status: DC
  Administered 2019-08-07: 237 mL via ORAL

## 2019-08-07 MED ORDER — ONDANSETRON HCL 4 MG PO TABS: 4.0000 mg | ORAL_TABLET | Freq: Four times a day (QID) | ORAL | Status: DC | PRN

## 2019-08-07 MED ORDER — FERROUS SULFATE 325 (65 FE) MG PO TABS
325.0000 mg | ORAL_TABLET | Freq: Every day | ORAL | Status: DC
  Administered 2019-08-07 – 2019-08-11 (×4): 325 mg via ORAL
  Filled 2019-08-07 (×5): qty 1

## 2019-08-07 MED ORDER — ASCORBIC ACID 500 MG PO TABS
1000.0000 mg | ORAL_TABLET | Freq: Every day | ORAL | Status: DC
  Administered 2019-08-07 – 2019-08-12 (×6): 1000 mg via ORAL
  Filled 2019-08-07 (×6): qty 2

## 2019-08-07 MED ORDER — BUDESONIDE 0.25 MG/2ML IN SUSP
0.2500 mg | Freq: Two times a day (BID) | RESPIRATORY_TRACT | Status: DC
  Administered 2019-08-07 – 2019-08-12 (×10): 0.25 mg via RESPIRATORY_TRACT
  Filled 2019-08-07 (×10): qty 2

## 2019-08-07 MED ORDER — GABAPENTIN 300 MG PO CAPS
600.0000 mg | ORAL_CAPSULE | Freq: Every day | ORAL | Status: DC
  Administered 2019-08-07 – 2019-08-11 (×5): 600 mg via ORAL
  Filled 2019-08-07 (×5): qty 2

## 2019-08-07 MED ORDER — SODIUM CHLORIDE 0.9% FLUSH
3.0000 mL | Freq: Two times a day (BID) | INTRAVENOUS | Status: DC
  Administered 2019-08-07 – 2019-08-12 (×9): 3 mL via INTRAVENOUS

## 2019-08-07 MED ORDER — INSULIN ASPART 100 UNIT/ML ~~LOC~~ SOLN
0.0000 [IU] | Freq: Every day | SUBCUTANEOUS | Status: DC
  Administered 2019-08-08: 5 [IU] via SUBCUTANEOUS

## 2019-08-07 MED ORDER — IPRATROPIUM-ALBUTEROL 0.5-2.5 (3) MG/3ML IN SOLN: 3.0000 mL | Freq: Four times a day (QID) | RESPIRATORY_TRACT | Status: DC | PRN

## 2019-08-07 MED ORDER — INSULIN ASPART 100 UNIT/ML ~~LOC~~ SOLN
0.0000 [IU] | Freq: Three times a day (TID) | SUBCUTANEOUS | Status: DC
  Administered 2019-08-08: 1 [IU] via SUBCUTANEOUS
  Administered 2019-08-08: 2 [IU] via SUBCUTANEOUS
  Administered 2019-08-09: 3 [IU] via SUBCUTANEOUS
  Administered 2019-08-09: 7 [IU] via SUBCUTANEOUS
  Administered 2019-08-10: 3 [IU] via SUBCUTANEOUS
  Administered 2019-08-11: 2 [IU] via SUBCUTANEOUS
  Administered 2019-08-12: 3 [IU] via SUBCUTANEOUS
  Administered 2019-08-12: 1 [IU] via SUBCUTANEOUS

## 2019-08-07 MED ORDER — PREDNISONE 5 MG PO TABS
5.0000 mg | ORAL_TABLET | Freq: Every day | ORAL | Status: DC
  Administered 2019-08-07: 5 mg via ORAL
  Filled 2019-08-07: qty 1

## 2019-08-07 MED ORDER — TRAMADOL HCL 50 MG PO TABS
50.0000 mg | ORAL_TABLET | Freq: Four times a day (QID) | ORAL | Status: DC | PRN
  Administered 2019-08-07 – 2019-08-11 (×9): 50 mg via ORAL
  Filled 2019-08-07 (×9): qty 1

## 2019-08-07 MED ORDER — RAMELTEON 8 MG PO TABS
8.0000 mg | ORAL_TABLET | Freq: Every day | ORAL | Status: DC
  Administered 2019-08-07 – 2019-08-11 (×5): 8 mg via ORAL
  Filled 2019-08-07 (×7): qty 1

## 2019-08-07 NOTE — Progress Notes (Signed)
PROGRESS NOTE  Jeremy [name] OXB:[id number] DOB: [birth year]   PCP: [name], MD  Patient is from: home.  Lives with his wife.  DOA: 08/06/2019 LOS: 1  Brief Narrative / Interim history: 74 year old man with history of COPD not on oxygen, former smoker, HTN, DM-2 and psoriatic arthritis presented to [facility] with cough and hemoptysis, shortness of breath, chills and unintentional weight loss of about 20 pounds over last year.    In ED, slight tachycardia and tachypnea.  CTA chest negative for PE but large necrotic pulmonary mass extending to the carina with associated obstruction of the RLL.  WBC 16.6.  He was admitted to [facility] for bronchoscopy by pulmonology.  He was started on IV Unasyn for possible postobstructive pneumonia.  Subjective: No major events overnight or this morning.  Cough and breathing better.  He denies chest pain.  Reportedly has normal colonoscopy about 3 months ago.  Objective: Vitals:   08/07/19 0300 08/07/19 0312 08/07/19 0825 08/07/19 0847  BP: (!) 165/91 (!) 165/91 115/64   Pulse:  100 (!) 104 96  Resp: (!) 28 20 (!) 26 20  Temp: 97.7 F (36.5 C) 97.6 F (36.4 C) 98.3 F (36.8 C)   TempSrc: Oral Oral Oral   SpO2: 90% 96% 92% 92%  Weight:  72.6 kg    Height:  6\' 3"  (1.905 m)      Intake/Output Summary (Last 24 hours) at 08/07/2019 1444 Last data filed at 08/07/2019 1400 Gross per 24 hour  Intake 910 ml  Output --  Net 910 ml   Filed Weights   08/06/19 1111 08/07/19 0312  Weight: 88.8 kg 72.6 kg    Examination:  GENERAL: No acute distress.  Appears well.  HEENT: MMM.  Vision and hearing grossly intact.  NECK: Supple.  No apparent JVD.  RESP:  No IWOB.  Diminished aeration on the right. CVS:  RRR. Heart sounds normal.  ABD/GI/GU: Bowel sounds present. Soft. Non tender.  MSK/EXT:  Moves extremities. No apparent deformity. No edema.  SKIN: no apparent skin lesion or wound NEURO: Awake, alert and oriented appropriately.  No apparent  focal neuro deficit. PSYCH: Calm. Normal affect.  Procedures:  None  Assessment & Plan: RLL necrotic mass with mass-effect: concerning for primary lung cancer. -Plan for bronchoscopy by pulmonology on 3/5. -Will involve oncology after pathology result  Postobstructive pneumonia: -Continue IV Unasyn   COPD exacerbation-heavy tobacco use history -Continue oral prednisone and as needed nebulizers -Add LAMA/LABA  Cough with hemoptysis: Likely due to the above. -Continue monitoring -Check coag in the morning  DM-2 with hyperglycemia: On Metformin at home. -Check hemoglobin A1c -CBG monitoring and SSI-thin -Adjust insulin as appropriate -We will initiate statin prior to discharge  Essential hypertension: Normotensive -Continue home lisinopril  Psoriatic arthritis: Stable.  On methotrexate and low-dose prednisone at home. -Hold methotrexate while treating for pneumonia -Prednisone as above  Hypomagnesemia -Replenish and recheck  Normocytic anemia: H&H stable -Check anemia panel -Continue ferrous sulfate  Leukocytosis/bandemia: Likely due to obstructive pneumonia and chronic steroid use -Continue monitoring    Severe protein calorie malnutrition//unintentional weight loss-reportedly about 20 pounds weight loss in the last 12 months. Nutrition Problem: Severe Malnutrition Etiology: chronic illness(COPD, lung mass)  Signs/Symptoms: energy intake < or equal to 75% for > or equal to 1 month, severe fat depletion, percent weight loss(17% weight loss within 6 months) Percent weight loss: 17 %  Interventions: Ensure Enlive (each supplement provides 350kcal and 20 grams of protein)  DVT prophylaxis: SCD in the setting of hemoptysis Code Status: Full code Family Communication: Updated patient's wife over the phone.  Discharge barrier: Evaluation for lung mass and treatment for postobstructive pneumonia.  Needs bronchoscopy for evaluation of new lung mass. Patient is from:  Home Final disposition: Likely home after evaluation and treatment of lung mass and postobstructive pneumonia  Consultants: PCCM   Microbiology summarized: COVID-19 negative Blood culture-negative  Sch Meds:  Scheduled Meds: . vitamin C  1,000 mg Oral Daily  . budesonide  0.25 mg Inhalation BID  . feeding supplement (ENSURE ENLIVE)  237 mL Oral TID BM  . ferrous sulfate  325 mg Oral Q supper  . folic acid  1 mg Oral Daily  . gabapentin  600 mg Oral QHS  . insulin aspart  0-5 Units Subcutaneous QHS  . insulin aspart  0-9 Units Subcutaneous TID WC  . lisinopril  10 mg Oral Daily  . pantoprazole  40 mg Oral QAC breakfast  . predniSONE  50 mg Oral Q breakfast  . sodium chloride flush  3 mL Intravenous Q12H  . umeclidinium-vilanterol  1 puff Inhalation Daily   Continuous Infusions: . sodium chloride    . ampicillin-sulbactam (UNASYN) IV 3 g (08/07/19 1136)  . magnesium sulfate bolus IVPB     PRN Meds:.sodium chloride, acetaminophen **OR** acetaminophen, ipratropium-albuterol, ondansetron **OR** ondansetron (ZOFRAN) IV, sodium chloride flush, traMADol  Antimicrobials: Anti-infectives (From admission, onward)   Start     Dose/Rate Route Frequency Ordered Stop   08/06/19 1800  Ampicillin-Sulbactam (UNASYN) 3 g in sodium chloride 0.9 % 100 mL IVPB     3 g 200 mL/hr over 30 Minutes Intravenous Every 6 hours 08/06/19 1703     08/06/19 1345  azithromycin (ZITHROMAX) 500 mg in sodium chloride 0.9 % 250 mL IVPB  Status:  Discontinued     500 mg 250 mL/hr over 60 Minutes Intravenous Every 24 hours 08/06/19 1321 08/07/19 1234   08/06/19 1330  cefTRIAXone (ROCEPHIN) 2 g in sodium chloride 0.9 % 100 mL IVPB     2 g 200 mL/hr over 30 Minutes Intravenous  Once 08/06/19 1321 08/06/19 1502       I have personally reviewed the following labs and images: CBC: Recent Labs  Lab 08/06/19 1211 08/07/19 0527  WBC 16.6* 20.1*  NEUTROABS  --  17.5*  HGB 10.1* 10.3*  HCT 34.0* 33.8*  MCV  92.4 89.7  PLT 368 333   BMP &GFR Recent Labs  Lab 08/06/19 1211 08/07/19 0527  NA 137  --   K 4.3  --   CL 97*  --   CO2 29  --   GLUCOSE 157*  --   BUN 21  --   CREATININE 0.73  --   CALCIUM 9.2  --   MG  --  1.6*   Estimated Creatinine Clearance: 81.9 mL/min (by C-G formula based on SCr of 0.73 mg/dL). Liver & Pancreas: Recent Labs  Lab 08/06/19 1211  AST 15  ALT 25  ALKPHOS 59  BILITOT 0.6  PROT 7.7  ALBUMIN 3.0*   No results for input(s): LIPASE, AMYLASE in the last 168 hours. No results for input(s): AMMONIA in the last 168 hours. Diabetic: No results for input(s): HGBA1C in the last 72 hours. No results for input(s): GLUCAP in the last 168 hours. Cardiac Enzymes: No results for input(s): CKTOTAL, CKMB, CKMBINDEX, TROPONINI in the last 168 hours. No results for input(s): PROBNP in the last 8760 hours. Coagulation Profile: No  results for input(s): INR, PROTIME in the last 168 hours. Thyroid Function Tests: No results for input(s): TSH, T4TOTAL, FREET4, T3FREE, THYROIDAB in the last 72 hours. Lipid Profile: No results for input(s): CHOL, HDL, LDLCALC, TRIG, CHOLHDL, LDLDIRECT in the last 72 hours. Anemia Panel: No results for input(s): VITAMINB12, FOLATE, FERRITIN, TIBC, IRON, RETICCTPCT in the last 72 hours. Urine analysis: No results found for: COLORURINE, APPEARANCEUR, LABSPEC, PHURINE, GLUCOSEU, HGBUR, BILIRUBINUR, KETONESUR, PROTEINUR, UROBILINOGEN, NITRITE, LEUKOCYTESUR Sepsis Labs: Invalid input(s): PROCALCITONIN, [place]  Microbiology: Recent Results (from the past 240 hour(s))  Culture, blood (single) w Reflex to ID Panel     Status: None (Preliminary result)   Collection Time: 08/06/19  1:50 PM   Specimen: Left Antecubital; Blood  Result Value Ref Range Status   Specimen Description LEFT ANTECUBITAL  Final   Special Requests   Final    BOTTLES DRAWN AEROBIC AND ANAEROBIC Blood Culture adequate volume   Culture   Final    NO GROWTH < 24  HOURS Performed at [facility], [address] [place], [place] [address]    Report Status PENDING  Incomplete  SARS CORONAVIRUS 2 (TAT 6-24 HRS) Nasopharyngeal Nasopharyngeal Swab     Status: None   Collection Time: 08/06/19  2:28 PM   Specimen: Nasopharyngeal Swab  Result Value Ref Range Status   SARS Coronavirus 2 NEGATIVE NEGATIVE Final    Comment: (NOTE) SARS-CoV-2 target nucleic acids are NOT DETECTED. The SARS-CoV-2 RNA is generally detectable in upper and lower respiratory specimens during the acute phase of infection. Negative results do not preclude SARS-CoV-2 infection, do not rule out co-infections with other pathogens, and should not be used as the sole basis for treatment or other patient management decisions. Negative results must be combined with clinical observations, patient history, and epidemiological information. The expected result is Negative. Fact Sheet for Patients: SugarRoll.be Fact Sheet for Healthcare Providers: https://www.woods-mathews.com/ This test is not yet approved or cleared by the [place] FDA and  has been authorized for detection and/or diagnosis of SARS-CoV-2 by FDA under an Emergency Use Authorization (EUA). This EUA will remain  in effect (meaning this test can be used) for the duration of the COVID-19 declaration under Section 56 4(b)(1) of the Act, 21 U.S.C. section 360bbb-3(b)(1), unless the authorization is terminated or revoked sooner. Performed at [facility], [place] [address], [place],  [address]     Radiology Studies: No results found.    Paxon Propes T. Bushnell  If 7PM-7AM, please contact night-coverage [email or web address] Password [facility] 08/07/2019, 2:44 PM

## 2019-08-07 NOTE — Consult Note (Signed)
NAME:  Jeremy [name], MRN:  [id number], DOB:  1945-08-24, LOS: 1 ADMISSION DATE:  08/06/2019, CONSULTATION DATE:  08/07/2019 REFERRING MD:  Dr. Cyndia Skeeters TRH, CHIEF COMPLAINT:  Pulmonary mass  Brief History   74yo male presented with hemoptysis, unintentional weight loss, persistent cough, and night sweats last several days.  Patient is a former smoker quit in 1993 after 25 years of smoking.  PCCM consulted after CTA chest revealed a large necrotic pulmonary mass  History of present illness   Jeremy [name] is a 74 year old male with a past medical history significant for hypertension, diabetes, COPD, former smoker, psoriatic arthritis who presented to the hospital with complaints of hemoptysis that began morning of arrival.  Patient also reports unintentional weight loss of approximately 20 pounds over the last year, cough, and chills.  He also reports progressive shortness of breath for the last couple weeks.  On arrival patient was evaluated for PE with CTA of the chest that revealed large necrotic pulmonary mass extending to the carina with associated obstruction of the right lower lobe bronchus. Lab work on admission significant for slightly elevated BNP, troponin, elevated slightly at 9, and WBC 16.6.  Vital signs on admission significant for mild tachycardia and tachypnea.  PCCM consulted to help assess pulmonary mass with likely need of bronchoscopy.  Past Medical History  Hypertension COPD Type 2 diabetes Former smoker Psoriatic arthritis  Significant Hospital Events   Admitted 3/3  Consults:    Procedures:    Significant Diagnostic Tests:  CTA chest 3/3 >  1. Negative for pulmonary embolus. 2. Large necrotic mass extends to the carina with associated obstruction of the right lower lobe bronchus. Postobstructive collapse/consolidation and necrosis in the right lower lobe. 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 3. 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 4. Borderline right paratracheal and subcarinal lymph nodes, likely metastatic. 5. Aortic atherosclerosis (ICD10-I70.0). Coronary artery calcification.  Micro Data:  COVID 3/3 > negative Blood culture 3/3 >  Antimicrobials:  Ceftriaxone 3/3 x1 Unasyn 3/3 >  Interim history/subjective:  Lying in bed with no reported complaints, denies any recurrent hemoptysis or dyspnea    Objective   Blood pressure 115/64, pulse (!) 104, temperature 98.3 F (36.8 C), temperature source Oral, resp. rate (!) 26, height 6\' 3"  (1.905 m), weight 72.6 kg, SpO2 92 %.        Intake/Output Summary (Last 24 hours) at 08/07/2019 4081 Last data filed at 08/07/2019 0201 Gross per 24 hour  Intake 550 ml  Output --  Net 550 ml   Filed Weights   08/06/19 1111 08/07/19 0312  Weight: 88.8 kg 72.6 kg    Examination: General: Chronically thin elderly male lying in bed, in NAD HEENT: [place]/AT, MM pink/moist, PERRL,nasal canula in place  Neuro: Alert and oriented x3, non-focal  CV: s1s2 regular rate and rhythm, no murmur, rubs, or gallops,  PULM:  Diminished right with faint rhonchi, left clear to ascultation, no increased work of breathing, oxygen saturation  92-96 on 2LNC GI: soft, bowel sounds active in all 4 quadrants, non-tender, non-distended Extremities: warm/dry, no edema  Skin: no rashes or lesions   Resolved Hospital Problem list     Assessment & Plan:  Large necrotic pulmonary mass with associated postobstructive pneumonia Hemoptysis -CT chest positive for large pulmonary mass with signs of associated post obstructive anemia.  Patient reports history of smoking for  25 years before quitting weight 1993.  Patient also reports unintentional weight loss, chills and night sweats Plan: Patient will need bronchoscopy to further assess pulmonary mass  Close monitor of hemoptysis, if  bleeding becomes significant consider inhaled TXA Continue antibiotic therapy for postobstructive pneumonia Pulmonary hygiene as able Continue supplemental oxygen to maintain sats greater than 92% Head of bed elevated 30 degrees  Rest of chronic medical conditions managed by primary   PCCM will continue to follow  Best practice:  Diet: Regular able transition to NPO Pain/Anxiety/Delirium protocol (if indicated): PRNs VAP protocol (if indicated): N/A DVT prophylaxis: SCD only high risk pulmonary hemorrhage  GI prophylaxis: PPI Glucose control: SSI Mobility: Bedrest Code Status: Full, verified with patient 3/3  Family Communication: Patient states he will update wife  Disposition: Floor   Labs   CBC: Recent Labs  Lab 08/06/19 1211 08/07/19 0527  WBC 16.6* 20.1*  NEUTROABS  --  17.5*  HGB 10.1* 10.3*  HCT 34.0* 33.8*  MCV 92.4 89.7  PLT 368 161    Basic Metabolic Panel: Recent Labs  Lab 08/06/19 1211 08/07/19 0527  NA 137  --   K 4.3  --   CL 97*  --   CO2 29  --   GLUCOSE 157*  --   BUN 21  --   CREATININE 0.73  --   CALCIUM 9.2  --   MG  --  1.6*   GFR: Estimated Creatinine Clearance: 81.9 mL/min (by C-G formula based on SCr of 0.73 mg/dL). Recent Labs  Lab 08/06/19 1211 08/07/19 0527  WBC 16.6* 20.1*    Liver Function Tests: Recent Labs  Lab 08/06/19 1211  AST 15  ALT 25  ALKPHOS 59  BILITOT 0.6  PROT 7.7  ALBUMIN 3.0*   No results for input(s): LIPASE, AMYLASE in the last 168 hours. No results for input(s): AMMONIA in the last 168 hours.  ABG    Component Value Date/Time   HCO3 28.7 (H) 08/06/2019 1350   O2SAT 31.6 08/06/2019 1350     Coagulation Profile: No results for input(s): INR, PROTIME in the last 168 hours.  Cardiac Enzymes: No results for input(s): CKTOTAL, CKMB, CKMBINDEX, TROPONINI in the last 168 hours.  HbA1C: No results found for: HGBA1C  CBG: No results for input(s): GLUCAP in the last 168 hours.  Review of  Systems: Positive in bold   Gen: Denies fever, chills, weight change, fatigue, night sweats HEENT: Denies blurred vision, double vision, hearing loss, tinnitus, sinus congestion, rhinorrhea, sore throat, neck stiffness, dysphagia PULM: Denies shortness of breath, cough, sputum production, hemoptysis, wheezing CV: Denies chest pain, edema, orthopnea, paroxysmal nocturnal dyspnea, palpitations GI: Denies abdominal pain, nausea, vomiting, diarrhea, hematochezia, melena, constipation, change in bowel habits GU: Denies dysuria, hematuria, polyuria, oliguria, urethral discharge Endocrine: Denies hot or cold intolerance, polyuria, polyphagia or appetite change Derm: Denies rash, dry skin, scaling or peeling skin change Heme: Denies easy bruising, bleeding, bleeding gums Neuro: Denies headache, numbness, weakness, slurred speech, loss of memory or consciousness  Past Medical History  He,  has a past medical history of COPD (chronic obstructive pulmonary disease) ([place]), Diabetes ([place]) (2018), HTN (hypertension), and Psoriatic arthritis ([place]) (2015).   Surgical History    Past Surgical History:  Procedure Laterality Date  . BACK SURGERY  1990  . BIOPSY  12/30/2018   Procedure: BIOPSY;  Surgeon: Danie Binder, MD;  Location: [facility];  Service: Endoscopy;;  gastric  . CATARACT EXTRACTION Bilateral   .  [place]  . COLONOSCOPY WITH PROPOFOL N/A 12/30/2018   Procedure: COLONOSCOPY WITH PROPOFOL;  Surgeon: Danie Binder, MD;  Location: [facility];  Service: Endoscopy;  Laterality: N/A;  12:30pm  . ESOPHAGOGASTRODUODENOSCOPY (EGD) WITH PROPOFOL N/A 12/30/2018   Procedure: ESOPHAGOGASTRODUODENOSCOPY (EGD) WITH PROPOFOL;  Surgeon: Danie Binder, MD;  Location: [facility];  Service: Endoscopy;  Laterality: N/A;  . POLYPECTOMY  12/30/2018   Procedure: POLYPECTOMY;  Surgeon: Danie Binder, MD;  Location: [facility];  Service: Endoscopy;;  colon     Social History    reports that he quit smoking about 27 years ago. His smoking use included cigarettes. He has a 25.00 pack-year smoking history. He has never used smokeless tobacco. He reports current alcohol use. He reports that he does not use drugs.   Family History   His family history includes Alcoholism in his father; CAD in his brother; Diabetes in his brother. There is no history of Colon cancer, Colon polyps, or Gastric cancer.   Allergies Allergies  Allergen Reactions  . Aleve [Naproxen Sodium] Hives and Rash     Home Medications  Prior to Admission medications   Medication Sig Start Date End Date Taking? Authorizing Provider  acetaminophen (TYLENOL) 500 MG tablet Take 500 mg by mouth every 4 (four) hours as needed (for pain.).   Yes [provider]  albuterol (VENTOLIN HFA) 108 (90 Base) MCG/ACT inhaler Inhale 2 puffs into the lungs every 6 (six) hours as needed for wheezing or shortness of breath.   Yes [provider]  Ascorbic Acid (VITAMIN C) 1000 MG tablet Take 1,000 mg by mouth daily.   Yes [provider]  doxycycline (MONODOX) 50 MG capsule Take 50 mg by mouth 2 (two) times daily as needed (rosacea flare ups.).   Yes [provider]  ferrous sulfate 325 (65 FE) MG tablet Take 325 mg by mouth daily with supper.   Yes [provider]  FLOVENT HFA 110 MCG/ACT inhaler 1 puff 2 (two) times daily. 06/20/19  Yes [provider]  folic acid (FOLVITE) 1 MG tablet Take 1 mg by mouth daily.   Yes [provider]  gabapentin (NEURONTIN) 300 MG capsule Take 600 mg by mouth at bedtime.   Yes [provider]  lisinopril (ZESTRIL) 20 MG tablet Take 10 mg by mouth daily.  10/01/18  Yes [provider]  metFORMIN (GLUCOPHAGE-XR) 500 MG 24 hr tablet Take 500-1,000 mg by mouth See admin instructions. Take 2 tablets (1000 mg) by mouth in the morning & 1 tablet (500 mg) by mouth at night. 10/15/18  Yes [provider]   metroNIDAZOLE (METROCREAM) 0.75 % cream Apply 1 application topically 2 (two) times daily as needed (facial rosacea).    Yes [provider]  mometasone (ELOCON) 0.1 % cream Apply 1 application topically 2 (two) times daily as needed (psoriasis).    Yes [provider]  Omega-3 Fatty Acids (FISH OIL) 1000 MG CAPS Take 1,000 mg by mouth daily.    Yes [provider]  pantoprazole (PROTONIX) 40 MG tablet 1 po 30 mins prior to first meal 12/30/18  Yes Fields, Sandi L, MD  predniSONE (DELTASONE) 5 MG tablet Take 5 mg by mouth daily with breakfast.   Yes [provider]  traMADol (ULTRAM) 50 MG tablet Take 50 mg by mouth every 4 (four) hours as needed. for pain 08/19/18  Yes [provider]  cyclobenzaprine (FLEXERIL) 10 MG tablet  Take 10 mg by mouth at bedtime. 09/13/18   [provider]  methotrexate 50 MG/2ML injection Inject 17.5 mg into the muscle every Friday.  09/16/18   [provider]     Signature :    Johnsie Cancel, NP-C [facility] Contact / Pager information can be found on Amion  08/07/2019, 9:46 AM

## 2019-08-07 NOTE — ED Notes (Signed)
Family updated as to patient's status.

## 2019-08-07 NOTE — Progress Notes (Signed)
Patient changing position and taking medications. VSS within a few minutes.

## 2019-08-07 NOTE — Progress Notes (Signed)
Initial Nutrition Assessment  DOCUMENTATION CODES:   Severe malnutrition in context of chronic illness  INTERVENTION:    Increase Ensure Enlive to TID, each supplement provides 350 kcal and 20 grams of protein  NUTRITION DIAGNOSIS:   Severe Malnutrition related to chronic illness(COPD, lung mass) as evidenced by energy intake < or equal to 75% for > or equal to 1 month, severe fat depletion, percent weight loss(17% weight loss within 6 months).  GOAL:   Patient will meet greater than or equal to 90% of their needs  MONITOR:   PO intake, Supplement acceptance, Labs  REASON FOR ASSESSMENT:   Malnutrition Screening Tool    ASSESSMENT:   74 yo male admitted with hemoptysis. CTA chest revealed a large necrotic pulmonary mass concerning for primary bronchogenic carcinoma. PMH includes COPD, HTN, DM-2, psoriatic arthritis.   Spoke with patient and his wife at bedside.  He reports 20 lb weight loss in the past 6 months; wife thinks he's lost closer to 30 lbs. Usual weight 192 lbs, down to 160 lbs today. 17% weight loss within 6 months is significant for the time frame. He has been eating poorly at home due to decreased appetite. From review of usual intake, suspect he has been consuming <75% of estimated energy requirement for >/= 1 month.   Currently on a regular diet; 75% meal completion at breakfast today. Wife has ordered his meals through tomorrow's breakfast. He started drinking Ensure this week and likes the chocolate flavor best. Agreed to drink them TID between meals.   Plans for bronchoscopy tomorrow, will be NPO after midnight.  Labs reviewed.  Medications reviewed and include vitamin C, ferrous sulfate, folic acid, prednisone.    NUTRITION - FOCUSED PHYSICAL EXAM:    Most Recent Value  Orbital Region  Moderate depletion  Upper Arm Region  Severe depletion  Thoracic and Lumbar Region  Moderate depletion  Buccal Region  Mild depletion  Temple Region  Moderate  depletion  Clavicle Bone Region  Moderate depletion  Clavicle and Acromion Bone Region  Moderate depletion  Scapular Bone Region  Moderate depletion  Dorsal Hand  Mild depletion  Patellar Region  Unable to assess  Anterior Thigh Region  Unable to assess  Posterior Calf Region  Unable to assess  Edema (RD Assessment)  Unable to assess  Hair  Reviewed  Eyes  Reviewed  Mouth  Reviewed  Skin  Reviewed  Nails  Reviewed       Diet Order:   Diet Order            Diet NPO time specified  Diet effective midnight        Diet regular Room service appropriate? Yes; Fluid consistency: Thin  Diet effective now              EDUCATION NEEDS:   Education needs have been addressed  Skin:  Skin Assessment: Reviewed RN Assessment  Last BM:  unknown  Height:   Ht Readings from Last 1 Encounters:  08/07/19 6\' 3"  (1.905 m)    Weight:   Wt Readings from Last 1 Encounters:  08/07/19 72.6 kg    Ideal Body Weight:  89.1 kg  BMI:  Body mass index is 20.01 kg/m.  Estimated Nutritional Needs:   Kcal:  2100-2300  Protein:  110-130 gm  Fluid:  >/= 2.1 L    Molli Barrows, RD, LDN, CNSC Please refer to Amion for contact information.

## 2019-08-07 NOTE — ED Notes (Signed)
Report given to Ryan with [facility]  

## 2019-08-07 NOTE — ED Notes (Signed)
Report given to [employer] on 3W

## 2019-08-07 NOTE — H&P (View-Only) (Signed)
NAME:  Jeremy [name], MRN:  [id number], DOB:  04-Dec-1945, LOS: 1 ADMISSION DATE:  08/06/2019, CONSULTATION DATE:  08/07/2019 REFERRING MD:  Dr. Cyndia Skeeters TRH, CHIEF COMPLAINT:  Pulmonary mass  Brief History   74yo male presented with hemoptysis, unintentional weight loss, persistent cough, and night sweats last several days.  Patient is a former smoker quit in 1993 after 25 years of smoking.  PCCM consulted after CTA chest revealed a large necrotic pulmonary mass  History of present illness   Jeremy [name] is a 74 year old male with a past medical history significant for hypertension, diabetes, COPD, former smoker, psoriatic arthritis who presented to the hospital with complaints of hemoptysis that began morning of arrival.  Patient also reports unintentional weight loss of approximately 20 pounds over the last year, cough, and chills.  He also reports progressive shortness of breath for the last couple weeks.  On arrival patient was evaluated for PE with CTA of the chest that revealed large necrotic pulmonary mass extending to the carina with associated obstruction of the right lower lobe bronchus. Lab work on admission significant for slightly elevated BNP, troponin, elevated slightly at 9, and WBC 16.6.  Vital signs on admission significant for mild tachycardia and tachypnea.  PCCM consulted to help assess pulmonary mass with likely need of bronchoscopy.  Past Medical History  Hypertension COPD Type 2 diabetes Former smoker Psoriatic arthritis  Significant Hospital Events   Admitted 3/3  Consults:    Procedures:    Significant Diagnostic Tests:  CTA chest 3/3 >  1. Negative for pulmonary embolus. 2. Large necrotic mass extends to the carina with associated obstruction of the right lower lobe bronchus. Postobstructive collapse/consolidation and necrosis in the right lower lobe. 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 3. 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 4. Borderline right paratracheal and subcarinal lymph nodes, likely metastatic. 5. Aortic atherosclerosis (ICD10-I70.0). Coronary artery calcification.  Micro Data:  COVID 3/3 > negative Blood culture 3/3 >  Antimicrobials:  Ceftriaxone 3/3 x1 Unasyn 3/3 >  Interim history/subjective:  Lying in bed with no reported complaints, denies any recurrent hemoptysis or dyspnea    Objective   Blood pressure 115/64, pulse (!) 104, temperature 98.3 F (36.8 C), temperature source Oral, resp. rate (!) 26, height 6\' 3"  (1.905 m), weight 72.6 kg, SpO2 92 %.        Intake/Output Summary (Last 24 hours) at 08/07/2019 7096 Last data filed at 08/07/2019 0201 Gross per 24 hour  Intake 550 ml  Output --  Net 550 ml   Filed Weights   08/06/19 1111 08/07/19 0312  Weight: 88.8 kg 72.6 kg    Examination: General: Chronically thin elderly male lying in bed, in NAD HEENT: /AT, MM pink/moist, PERRL,nasal canula in place  Neuro: Alert and oriented x3, non-focal  CV: s1s2 regular rate and rhythm, no murmur, rubs, or gallops,  PULM:  Diminished right with faint rhonchi, left clear to ascultation, no increased work of breathing, oxygen saturation  92-96 on 2LNC GI: soft, bowel sounds active in all 4 quadrants, non-tender, non-distended Extremities: warm/dry, no edema  Skin: no rashes or lesions   Resolved Hospital Problem list     Assessment & Plan:  Large necrotic pulmonary mass with associated postobstructive pneumonia Hemoptysis -CT chest positive for large pulmonary mass with signs of associated post obstructive anemia.  Patient reports history of smoking for  25 years before quitting weight 1993.  Patient also reports unintentional weight loss, chills and night sweats Plan: Patient will need bronchoscopy to further assess pulmonary mass  Close monitor of hemoptysis, if  bleeding becomes significant consider inhaled TXA Continue antibiotic therapy for postobstructive pneumonia Pulmonary hygiene as able Continue supplemental oxygen to maintain sats greater than 92% Head of bed elevated 30 degrees  Rest of chronic medical conditions managed by primary   PCCM will continue to follow  Best practice:  Diet: Regular able transition to NPO Pain/Anxiety/Delirium protocol (if indicated): PRNs VAP protocol (if indicated): N/A DVT prophylaxis: SCD only high risk pulmonary hemorrhage  GI prophylaxis: PPI Glucose control: SSI Mobility: Bedrest Code Status: Full, verified with patient 3/3  Family Communication: Patient states he will update wife  Disposition: Floor   Labs   CBC: Recent Labs  Lab 08/06/19 1211 08/07/19 0527  WBC 16.6* 20.1*  NEUTROABS  --  17.5*  HGB 10.1* 10.3*  HCT 34.0* 33.8*  MCV 92.4 89.7  PLT 368 295    Basic Metabolic Panel: Recent Labs  Lab 08/06/19 1211 08/07/19 0527  NA 137  --   K 4.3  --   CL 97*  --   CO2 29  --   GLUCOSE 157*  --   BUN 21  --   CREATININE 0.73  --   CALCIUM 9.2  --   MG  --  1.6*   GFR: Estimated Creatinine Clearance: 81.9 mL/min (by C-G formula based on SCr of 0.73 mg/dL). Recent Labs  Lab 08/06/19 1211 08/07/19 0527  WBC 16.6* 20.1*    Liver Function Tests: Recent Labs  Lab 08/06/19 1211  AST 15  ALT 25  ALKPHOS 59  BILITOT 0.6  PROT 7.7  ALBUMIN 3.0*   No results for input(s): LIPASE, AMYLASE in the last 168 hours. No results for input(s): AMMONIA in the last 168 hours.  ABG    Component Value Date/Time   HCO3 28.7 (H) 08/06/2019 1350   O2SAT 31.6 08/06/2019 1350     Coagulation Profile: No results for input(s): INR, PROTIME in the last 168 hours.  Cardiac Enzymes: No results for input(s): CKTOTAL, CKMB, CKMBINDEX, TROPONINI in the last 168 hours.  HbA1C: No results found for: HGBA1C  CBG: No results for input(s): GLUCAP in the last 168 hours.  Review of  Systems: Positive in bold   Gen: Denies fever, chills, weight change, fatigue, night sweats HEENT: Denies blurred vision, double vision, hearing loss, tinnitus, sinus congestion, rhinorrhea, sore throat, neck stiffness, dysphagia PULM: Denies shortness of breath, cough, sputum production, hemoptysis, wheezing CV: Denies chest pain, edema, orthopnea, paroxysmal nocturnal dyspnea, palpitations GI: Denies abdominal pain, nausea, vomiting, diarrhea, hematochezia, melena, constipation, change in bowel habits GU: Denies dysuria, hematuria, polyuria, oliguria, urethral discharge Endocrine: Denies hot or cold intolerance, polyuria, polyphagia or appetite change Derm: Denies rash, dry skin, scaling or peeling skin change Heme: Denies easy bruising, bleeding, bleeding gums Neuro: Denies headache, numbness, weakness, slurred speech, loss of memory or consciousness  Past Medical History  He,  has a past medical history of COPD (chronic obstructive pulmonary disease) ([place]), Diabetes ([place]) (2018), HTN (hypertension), and Psoriatic arthritis ([place]) (2015).   Surgical History    Past Surgical History:  Procedure Laterality Date  . BACK SURGERY  1990  . BIOPSY  12/30/2018   Procedure: BIOPSY;  Surgeon: Danie Binder, MD;  [facility];  Service: Endoscopy;;  gastric  . CATARACT EXTRACTION Bilateral   .  [place]  . COLONOSCOPY WITH PROPOFOL N/A 12/30/2018   Procedure: COLONOSCOPY WITH PROPOFOL;  Surgeon: Danie Binder, MD;  Location[facility];  Service: Endoscopy;  Laterality: N/A;  12:30pm  . ESOPHAGOGASTRODUODENOSCOPY (EGD) WITH PROPOFOL N/A 12/30/2018   Procedure: ESOPHAGOGASTRODUODENOSCOPY (EGD) WITH PROPOFOL;  Surgeon: Danie Binder, MD;  [facility];  Service: Endoscopy;  Laterality: N/A;  . POLYPECTOMY  12/30/2018   Procedure: POLYPECTOMY;  Surgeon: Danie Binder, MD;  [facility];  Service: Endoscopy;;  colon     Social History    reports that he quit smoking about 27 years ago. His smoking use included cigarettes. He has a 25.00 pack-year smoking history. He has never used smokeless tobacco. He reports current alcohol use. He reports that he does not use drugs.   Family History   His family history includes Alcoholism in his father; CAD in his brother; Diabetes in his brother. There is no history of Colon cancer, Colon polyps, or Gastric cancer.   Allergies Allergies  Allergen Reactions  . Aleve [Naproxen Sodium] Hives and Rash     Home Medications  Prior to Admission medications   Medication Sig Start Date End Date Taking? Authorizing Provider  acetaminophen (TYLENOL) 500 MG tablet Take 500 mg by mouth every 4 (four) hours as needed (for pain.).   Yes [provider]  albuterol (VENTOLIN HFA) 108 (90 Base) MCG/ACT inhaler Inhale 2 puffs into the lungs every 6 (six) hours as needed for wheezing or shortness of breath.   Yes [provider]  Ascorbic Acid (VITAMIN C) 1000 MG tablet Take 1,000 mg by mouth daily.   Yes [provider]  doxycycline (MONODOX) 50 MG capsule Take 50 mg by mouth 2 (two) times daily as needed (rosacea flare ups.).   Yes [provider]  ferrous sulfate 325 (65 FE) MG tablet Take 325 mg by mouth daily with supper.   Yes [provider]  FLOVENT HFA 110 MCG/ACT inhaler 1 puff 2 (two) times daily. 06/20/19  Yes [provider]  folic acid (FOLVITE) 1 MG tablet Take 1 mg by mouth daily.   Yes [provider]  gabapentin (NEURONTIN) 300 MG capsule Take 600 mg by mouth at bedtime.   Yes [provider]  lisinopril (ZESTRIL) 20 MG tablet Take 10 mg by mouth daily.  10/01/18  Yes [provider]  metFORMIN (GLUCOPHAGE-XR) 500 MG 24 hr tablet Take 500-1,000 mg by mouth See admin instructions. Take 2 tablets (1000 mg) by mouth in the morning & 1 tablet (500 mg) by mouth at night. 10/15/18  Yes [provider]   metroNIDAZOLE (METROCREAM) 0.75 % cream Apply 1 application topically 2 (two) times daily as needed (facial rosacea).    Yes [provider]  mometasone (ELOCON) 0.1 % cream Apply 1 application topically 2 (two) times daily as needed (psoriasis).    Yes [provider]  Omega-3 Fatty Acids (FISH OIL) 1000 MG CAPS Take 1,000 mg by mouth daily.    Yes [provider]  pantoprazole (PROTONIX) 40 MG tablet 1 po 30 mins prior to first meal 12/30/18  Yes Fields, Sandi L, MD  predniSONE (DELTASONE) 5 MG tablet Take 5 mg by mouth daily with breakfast.   Yes [provider]  traMADol (ULTRAM) 50 MG tablet Take 50 mg by mouth every 4 (four) hours as needed. for pain 08/19/18  Yes [provider]  cyclobenzaprine (FLEXERIL) 10 MG tablet  Take 10 mg by mouth at bedtime. 09/13/18   [provider]  methotrexate 50 MG/2ML injection Inject 17.5 mg into the muscle every Friday.  09/16/18   [provider]     Signature :    Johnsie Cancel, NP-C [facility] Contact / Pager information can be found on Amion  08/07/2019, 9:46 AM

## 2019-08-08 ENCOUNTER — Encounter (HOSPITAL_COMMUNITY)
Admission: EM | Disposition: A | Discharge status: Home / Self Care | Payer: Self-pay | Source: Home / Self Care | Attending: Student

## 2019-08-08 ENCOUNTER — Ambulatory Visit: Payer: Medicare Other | Admitting: Radiation Oncology

## 2019-08-08 ENCOUNTER — Encounter (HOSPITAL_COMMUNITY): Payer: Self-pay | Admitting: Family Medicine

## 2019-08-08 ENCOUNTER — Inpatient Hospital Stay (HOSPITAL_COMMUNITY): Payer: Medicare Other | Admitting: Certified Registered Nurse Anesthetist

## 2019-08-08 ENCOUNTER — Ambulatory Visit
Admit: 2019-08-08 | Discharge: 2019-08-08 | Disposition: A | Discharge status: Home / Self Care | Payer: Medicare Other | Source: Ambulatory Visit | Attending: Radiation Oncology | Admitting: Radiation Oncology

## 2019-08-08 DIAGNOSIS — Z510 Encounter for antineoplastic radiation therapy: Secondary | ICD-10-CM | POA: Insufficient documentation

## 2019-08-08 DIAGNOSIS — J984 Other disorders of lung: Secondary | ICD-10-CM

## 2019-08-08 DIAGNOSIS — J90 Pleural effusion, not elsewhere classified: Secondary | ICD-10-CM

## 2019-08-08 DIAGNOSIS — I4891 Unspecified atrial fibrillation: Secondary | ICD-10-CM

## 2019-08-08 DIAGNOSIS — C3431 Malignant neoplasm of lower lobe, right bronchus or lung: Secondary | ICD-10-CM | POA: Insufficient documentation

## 2019-08-08 DIAGNOSIS — R042 Hemoptysis: Secondary | ICD-10-CM

## 2019-08-08 DIAGNOSIS — I480 Paroxysmal atrial fibrillation: Secondary | ICD-10-CM

## 2019-08-08 HISTORY — PX: VIDEO BRONCHOSCOPY: SHX5072

## 2019-08-08 HISTORY — PX: THORACENTESIS: SHX235

## 2019-08-08 LAB — GLUCOSE, CAPILLARY
Glucose-Capillary: 102 mg/dL — ABNORMAL HIGH (ref 70–99)
Glucose-Capillary: 115 mg/dL — ABNORMAL HIGH (ref 70–99)
Glucose-Capillary: 129 mg/dL — ABNORMAL HIGH (ref 70–99)
Glucose-Capillary: 192 mg/dL — ABNORMAL HIGH (ref 70–99)
Glucose-Capillary: 306 mg/dL — ABNORMAL HIGH (ref 70–99)
Glucose-Capillary: 375 mg/dL — ABNORMAL HIGH (ref 70–99)

## 2019-08-08 LAB — RENAL FUNCTION PANEL
Albumin: 2.2 g/dL — ABNORMAL LOW (ref 3.5–5.0)
Anion gap: 12 (ref 5–15)
BUN: 13 mg/dL (ref 8–23)
CO2: 28 mmol/L (ref 22–32)
Calcium: 8.8 mg/dL — ABNORMAL LOW (ref 8.9–10.3)
Chloride: 99 mmol/L (ref 98–111)
Creatinine, Ser: 0.76 mg/dL (ref 0.61–1.24)
GFR calc Af Amer: >60 mL/min (ref >60)
GFR calc non Af Amer: >60 mL/min (ref >60)
Glucose, Bld: 117 mg/dL — ABNORMAL HIGH (ref 70–99)
Phosphorus: 3.5 mg/dL (ref 2.5–4.6)
Potassium: 4.3 mmol/L (ref 3.5–5.1)
Sodium: 139 mmol/L (ref 135–145)

## 2019-08-08 LAB — CBC WITH DIFFERENTIAL/PLATELET
Abs Immature Granulocytes: 0.09 10*3/uL — ABNORMAL HIGH (ref 0.00–0.07)
Basophils Absolute: 0 10*3/uL (ref 0.0–0.1)
Basophils Relative: 0 %
Eosinophils Absolute: 0.2 10*3/uL (ref 0.0–0.5)
Eosinophils Relative: 2 %
HCT: 28.5 % — ABNORMAL LOW (ref 39.0–52.0)
Hemoglobin: 8.7 g/dL — ABNORMAL LOW (ref 13.0–17.0)
Immature Granulocytes: 1 %
Lymphocytes Relative: 9 %
Lymphs Abs: 1.2 10*3/uL (ref 0.7–4.0)
MCH: 27.2 pg (ref 26.0–34.0)
MCHC: 30.5 g/dL (ref 30.0–36.0)
MCV: 89.1 fL (ref 80.0–100.0)
Monocytes Absolute: 0.8 10*3/uL (ref 0.1–1.0)
Monocytes Relative: 6 %
Neutro Abs: 11.4 10*3/uL — ABNORMAL HIGH (ref 1.7–7.7)
Neutrophils Relative %: 82 %
Platelets: 255 10*3/uL (ref 150–400)
RBC: 3.2 MIL/uL — ABNORMAL LOW (ref 4.22–5.81)
RDW: 17.8 % — ABNORMAL HIGH (ref 11.5–15.5)
WBC: 13.7 10*3/uL — ABNORMAL HIGH (ref 4.0–10.5)
nRBC: 0 % (ref 0.0–0.2)

## 2019-08-08 LAB — BODY FLUID CELL COUNT WITH DIFFERENTIAL
Eos, Fluid: 1 %
Lymphs, Fluid: 79 %
Monocyte-Macrophage-Serous Fluid: 11 % — ABNORMAL LOW (ref 50–90)
Neutrophil Count, Fluid: 9 % (ref 0–25)
Total Nucleated Cell Count, Fluid: 1845 cu mm — ABNORMAL HIGH (ref 0–1000)

## 2019-08-08 LAB — RETICULOCYTES
Immature Retic Fract: 29.3 % — ABNORMAL HIGH (ref 2.3–15.9)
RBC.: 3.3 MIL/uL — ABNORMAL LOW (ref 4.22–5.81)
Retic Count, Absolute: 55.4 10*3/uL (ref 19.0–186.0)
Retic Ct Pct: 1.7 % (ref 0.4–3.1)

## 2019-08-08 LAB — LACTATE DEHYDROGENASE, PLEURAL OR PERITONEAL FLUID: LD, Fluid: 287 U/L — ABNORMAL HIGH (ref 3–23)

## 2019-08-08 LAB — IRON AND TIBC
Iron: 23 ug/dL — ABNORMAL LOW (ref 45–182)
Saturation Ratios: 12 % — ABNORMAL LOW (ref 17.9–39.5)
TIBC: 189 ug/dL — ABNORMAL LOW (ref 250–450)
UIBC: 166 ug/dL

## 2019-08-08 LAB — FERRITIN: Ferritin: 1049 ng/mL — ABNORMAL HIGH (ref 24–336)

## 2019-08-08 LAB — MAGNESIUM: Magnesium: 2 mg/dL (ref 1.7–2.4)

## 2019-08-08 LAB — FOLATE: Folate: 16.2 ng/mL (ref >5.9)

## 2019-08-08 LAB — PROTEIN, PLEURAL OR PERITONEAL FLUID: Total protein, fluid: 3.7 g/dL

## 2019-08-08 LAB — VITAMIN B12: Vitamin B-12: 370 pg/mL (ref 180–914)

## 2019-08-08 SURGERY — VIDEO BRONCHOSCOPY WITHOUT FLUORO
Anesthesia: Monitor Anesthesia Care

## 2019-08-08 SURGERY — THORACENTESIS
Anesthesia: General | Site: Chest | Laterality: Right

## 2019-08-08 MED ORDER — DEXAMETHASONE SODIUM PHOSPHATE 10 MG/ML IJ SOLN
INTRAMUSCULAR | Status: DC | PRN
  Administered 2019-08-08: 4 mg via INTRAVENOUS

## 2019-08-08 MED ORDER — FENTANYL CITRATE (PF) 250 MCG/5ML IJ SOLN
INTRAMUSCULAR | Status: AC
  Filled 2019-08-08: qty 5

## 2019-08-08 MED ORDER — AMIODARONE HCL IN DEXTROSE 360-4.14 MG/200ML-% IV SOLN
60.0000 mg/h | INTRAVENOUS | Status: AC
  Administered 2019-08-08: 60 mg/h via INTRAVENOUS
  Filled 2019-08-08: qty 200

## 2019-08-08 MED ORDER — ACETAMINOPHEN 10 MG/ML IV SOLN: 1000.0000 mg | Freq: Once | INTRAVENOUS | Status: DC | PRN

## 2019-08-08 MED ORDER — ONDANSETRON HCL 4 MG/2ML IJ SOLN
INTRAMUSCULAR | Status: DC | PRN
  Administered 2019-08-08: 4 mg via INTRAVENOUS

## 2019-08-08 MED ORDER — MIDAZOLAM HCL 2 MG/2ML IJ SOLN
INTRAMUSCULAR | Status: AC
  Filled 2019-08-08: qty 2

## 2019-08-08 MED ORDER — PHENYLEPHRINE HCL-NACL 10-0.9 MG/250ML-% IV SOLN
INTRAVENOUS | Status: DC | PRN
  Administered 2019-08-08: 10 ug/min via INTRAVENOUS

## 2019-08-08 MED ORDER — FENTANYL CITRATE (PF) 100 MCG/2ML IJ SOLN: 25.0000 ug | INTRAMUSCULAR | Status: DC | PRN

## 2019-08-08 MED ORDER — EPINEPHRINE PF 1 MG/ML IJ SOLN
INTRAMUSCULAR | Status: DC | PRN
  Administered 2019-08-08: 2 mg via ENDOTRACHEOPULMONARY

## 2019-08-08 MED ORDER — EPINEPHRINE PF 1 MG/ML IJ SOLN
INTRAMUSCULAR | Status: AC
  Filled 2019-08-08: qty 1

## 2019-08-08 MED ORDER — SUGAMMADEX SODIUM 200 MG/2ML IV SOLN
INTRAVENOUS | Status: DC | PRN
  Administered 2019-08-08: 300 mg via INTRAVENOUS

## 2019-08-08 MED ORDER — AMIODARONE LOAD VIA INFUSION
150.0000 mg | Freq: Once | INTRAVENOUS | Status: AC
  Administered 2019-08-08: 150 mg via INTRAVENOUS
  Filled 2019-08-08: qty 83.34

## 2019-08-08 MED ORDER — PROPOFOL 10 MG/ML IV BOLUS
INTRAVENOUS | Status: DC | PRN
  Administered 2019-08-08: 120 mg via INTRAVENOUS

## 2019-08-08 MED ORDER — FENTANYL CITRATE (PF) 250 MCG/5ML IJ SOLN
INTRAMUSCULAR | Status: DC | PRN
  Administered 2019-08-08: 50 ug via INTRAVENOUS

## 2019-08-08 MED ORDER — EPINEPHRINE 1 MG/10ML IJ SOSY: PREFILLED_SYRINGE | INTRAMUSCULAR | Status: DC | PRN

## 2019-08-08 MED ORDER — ONDANSETRON HCL 4 MG/2ML IJ SOLN: 4.0000 mg | Freq: Once | INTRAMUSCULAR | Status: DC | PRN

## 2019-08-08 MED ORDER — LACTATED RINGERS IV SOLN: INTRAVENOUS | Status: DC | PRN

## 2019-08-08 MED ORDER — SODIUM CHLORIDE 0.9 % IV BOLUS
500.0000 mL | Freq: Once | INTRAVENOUS | Status: AC
  Administered 2019-08-08: 500 mL via INTRAVENOUS

## 2019-08-08 MED ORDER — LACTATED RINGERS IV SOLN: INTRAVENOUS | Status: DC

## 2019-08-08 MED ORDER — PHENYLEPHRINE 40 MCG/ML (10ML) SYRINGE FOR IV PUSH (FOR BLOOD PRESSURE SUPPORT)
PREFILLED_SYRINGE | INTRAVENOUS | Status: AC
  Filled 2019-08-08: qty 10

## 2019-08-08 MED ORDER — LIDOCAINE 2% (20 MG/ML) 5 ML SYRINGE
INTRAMUSCULAR | Status: DC | PRN
  Administered 2019-08-08: 80 mg via INTRAVENOUS

## 2019-08-08 MED ORDER — ROCURONIUM BROMIDE 10 MG/ML (PF) SYRINGE
PREFILLED_SYRINGE | INTRAVENOUS | Status: DC | PRN
  Administered 2019-08-08: 50 mg via INTRAVENOUS

## 2019-08-08 MED ORDER — ESMOLOL HCL 100 MG/10ML IV SOLN
INTRAVENOUS | Status: DC | PRN
  Administered 2019-08-08 (×5): 20 mg via INTRAVENOUS

## 2019-08-08 MED ORDER — METOPROLOL TARTRATE 5 MG/5ML IV SOLN
5.0000 mg | INTRAVENOUS | Status: DC | PRN
  Administered 2019-08-08: 5 mg via INTRAVENOUS

## 2019-08-08 MED ORDER — AMIODARONE HCL IN DEXTROSE 360-4.14 MG/200ML-% IV SOLN
30.0000 mg/h | INTRAVENOUS | Status: DC
  Administered 2019-08-08: 30 mg/h via INTRAVENOUS
  Filled 2019-08-08 (×2): qty 200

## 2019-08-08 MED ORDER — 0.9 % SODIUM CHLORIDE (POUR BTL) OPTIME
TOPICAL | Status: DC | PRN
  Administered 2019-08-08: 1000 mL

## 2019-08-08 MED ORDER — METOPROLOL TARTRATE 5 MG/5ML IV SOLN
INTRAVENOUS | Status: AC
  Filled 2019-08-08: qty 5

## 2019-08-08 MED ORDER — PROPOFOL 10 MG/ML IV BOLUS
INTRAVENOUS | Status: AC
  Filled 2019-08-08: qty 20

## 2019-08-08 SURGICAL SUPPLY — 40 items
ADAPTER VALVE BIOPSY EBUS (MISCELLANEOUS) IMPLANT
ADPTR VALVE BIOPSY EBUS (MISCELLANEOUS)
BRUSH CYTOL CELLEBRITY 1.5X140 (MISCELLANEOUS) IMPLANT
CANISTER SUCT 3000ML PPV (MISCELLANEOUS) ×5 IMPLANT
CNTNR URN SCR LID CUP LEK RST (MISCELLANEOUS) ×4 IMPLANT
CONT SPEC 4OZ STRL OR WHT (MISCELLANEOUS) ×4
COVER BACK TABLE 60X90IN (DRAPES) ×5 IMPLANT
FILTER STRAW FLUID ASPIR (MISCELLANEOUS) ×3 IMPLANT
FORCEPS BIOP RJ4 1.8 (CUTTING FORCEPS) IMPLANT
GAUZE SPONGE 4X4 12PLY STRL (GAUZE/BANDAGES/DRESSINGS) ×5 IMPLANT
GLOVE BIO SURGEON STRL SZ7.5 (GLOVE) ×5 IMPLANT
GOWN STRL REUS W/ TWL LRG LVL3 (GOWN DISPOSABLE) ×3 IMPLANT
GOWN STRL REUS W/TWL LRG LVL3 (GOWN DISPOSABLE) ×2
KIT CLEAN ENDO COMPLIANCE (KITS) ×10 IMPLANT
KIT TURNOVER KIT B (KITS) ×5 IMPLANT
MARKER SKIN DUAL TIP RULER LAB (MISCELLANEOUS) ×5 IMPLANT
NDL ASPIRATION VIZISHOT 19G (NEEDLE) IMPLANT
NDL ASPIRATION VIZISHOT 21G (NEEDLE) IMPLANT
NDL BIOPSY TRANSBRONCH 21G (NEEDLE) IMPLANT
NEEDLE ASPIRATION VIZISHOT 19G (NEEDLE) IMPLANT
NEEDLE ASPIRATION VIZISHOT 21G (NEEDLE) IMPLANT
NEEDLE BIOPSY TRANSBRONCH 21G (NEEDLE) ×5 IMPLANT
NS IRRIG 1000ML POUR BTL (IV SOLUTION) ×5 IMPLANT
OIL SILICONE PENTAX (PARTS (SERVICE/REPAIRS)) ×5 IMPLANT
PAD ARMBOARD 7.5X6 YLW CONV (MISCELLANEOUS) ×10 IMPLANT
SYR 20ML ECCENTRIC (SYRINGE) ×10 IMPLANT
SYR 20ML LL LF (SYRINGE) ×7 IMPLANT
SYR 3ML LL SCALE MARK (SYRINGE) ×3 IMPLANT
SYR 50ML SLIP (SYRINGE) IMPLANT
SYR 5ML LUER SLIP (SYRINGE) ×5 IMPLANT
TOWEL GREEN STERILE FF (TOWEL DISPOSABLE) ×5 IMPLANT
TRAP SPECIMEN MUCOUS 40CC (MISCELLANEOUS) ×3 IMPLANT
TRAY THORACENTESIS (SET/KITS/TRAYS/PACK) ×3 IMPLANT
TUBE CONNECTING 20'X1/4 (TUBING) ×2
TUBE CONNECTING 20X1/4 (TUBING) ×8 IMPLANT
UNDERPAD 30X30 (UNDERPADS AND DIAPERS) ×5 IMPLANT
VALVE BIOPSY  SINGLE USE (MISCELLANEOUS)
VALVE BIOPSY SINGLE USE (MISCELLANEOUS) ×2 IMPLANT
VALVE SUCTION BRONCHIO DISP (MISCELLANEOUS) ×8 IMPLANT
WATER STERILE IRR 1000ML POUR (IV SOLUTION) ×5 IMPLANT

## 2019-08-08 NOTE — Progress Notes (Signed)
PROGRESS NOTE  Jeremy [name] IWP:[id number] DOB: September 16, 1945   PCP: [name], MD  Patient is from: home.  Lives with his wife.  DOA: 08/06/2019 LOS: 2  Brief Narrative / Interim history: 74 year old man with history of COPD not on oxygen, former smoker, HTN, DM-2 and psoriatic arthritis presented to [facility] with cough and hemoptysis, shortness of breath, chills and unintentional weight loss of about 20 pounds over last year.    In ED, slight tachycardia and tachypnea.  CTA chest negative for PE but large necrotic pulmonary mass extending to the carina with associated obstruction of the RLL.  WBC 16.6.  He was admitted to [facility] for bronchoscopy by pulmonology.  He was started on IV Unasyn for possible postobstructive pneumonia.  Also started on systemic steroid for COPD.   Pulmonology consulted 0.3/4.  Patient had bronchoscopy with biopsy on 08/08/2019.  Pulmonology consulted radiation oncology.  Subjective: Patient was seen before he went for bronchoscopy this morning.  No major events overnight or this morning.  Continues to have hemoptysis.  He denies chest pain, shortness of breath, GI or UTI symptoms.  Objective: Vitals:   08/08/19 0500 08/08/19 0741 08/08/19 0759 08/08/19 1151  BP:   130/75 132/81  Pulse:  72 80 76  Resp:  (!) 23 (!) 21 20  Temp:   97.8 F (36.6 C) 97.6 F (36.4 C)  TempSrc:   Oral Oral  SpO2:  96% 94% 94%  Weight: 92.4 kg     Height:        Intake/Output Summary (Last 24 hours) at 08/08/2019 1428 Last data filed at 08/08/2019 1409 Gross per 24 hour  Intake 200 ml  Output 250 ml  Net -50 ml   Filed Weights   08/06/19 1111 08/07/19 0312 08/08/19 0500  Weight: 88.8 kg 72.6 kg 92.4 kg    Examination:  GENERAL: No acute distress.  Appears well.  HEENT: MMM.  Vision and hearing grossly intact.  NECK: Supple.  No apparent JVD.  RESP: On RA.  No IWOB.  Fair aeration bilaterally.  Slightly diminished on the right. CVS:  RRR. Heart sounds normal.   ABD/GI/GU: Bowel sounds present. Soft. Non tender.  MSK/EXT:  Moves extremities. No apparent deformity. No edema.  SKIN: no apparent skin lesion or wound NEURO: Awake, alert and oriented appropriately.  No apparent focal neuro deficit. PSYCH: Calm. Normal affect.   Procedures:  3/5-bronchoscopy  Assessment & Plan: RLL necrotic mass with mass-effect: concerning for primary lung cancer. Hemoptysis-likely due to lung mass.  Coag and platelet within normal. Postobstructive pneumonia: RLL mass with compression on right bronchus -Bronchoscopy on 3/5-follow pathology -PCCM contacting radiation oncology -Continue IV Unasyn   COPD exacerbation-heavy tobacco use history-25-pack-year before 1993.  Dyspnea resolved. -LAMA/LABA and as needed nebulizers -Decrease prednisone to home dose-5 mg daily  Controlled DM-2 with hyperglycemia: A1c 7.0%.  On Metformin at home. Recent Labs    08/08/19 0608 08/08/19 0758 08/08/19 1151  GLUCAP 102* 115* 129*  -CBG monitoring and SSI-thin -Adjust insulin as appropriate -Initiate statin prior to discharge  Essential hypertension: Normotensive -Lisinopril on hold per PCCM.  Psoriatic arthritis: Stable.  On methotrexate and low-dose prednisone at home. -Hold methotrexate while treating for pneumonia -Prednisone as above  Hypomagnesemia-resolved -Replenish and recheck  Normocytic anemia: Hgb 10.2 (12/2018)> 10.1 (admit)> 8.7.  Iron sat 12%.   -Continue ferrous sulfate -Monitor H&H  Leukocytosis/bandemia: Likely due to obstructive pneumonia and chronic steroid use.  Improving -Continue monitoring    Severe  protein calorie malnutrition//unintentional weight loss-reportedly about 20 pounds weight loss in the last 12 months. Nutrition Problem: Severe Malnutrition Etiology: chronic illness(COPD, lung mass)  Signs/Symptoms: energy intake < or equal to 75% for > or equal to 1 month, severe fat depletion, percent weight loss(17% weight loss within 6  months) Percent weight loss: 17 %  Interventions: Ensure Enlive (each supplement provides 350kcal and 20 grams of protein)   DVT prophylaxis: SCD in the setting of hemoptysis Code Status: Full code Family Communication: Updated patient's wife over the phone on 3/4.  Discharge barrier: Evaluation by expert including bronchoscopy, and treatment of postobstructive pneumonia and hemoptysis Patient is from: Home Final disposition: Likely home after evaluation and treatment of lung mass, hemoptysis and postobstructive pneumonia  Consultants: PCCM, radiation oncology   Microbiology summarized: COVID-19 negative Blood culture-negative  Sch Meds:  Scheduled Meds: . [MAR Hold] vitamin C  1,000 mg Oral Daily  . [MAR Hold] budesonide  0.25 mg Inhalation BID  . [MAR Hold] feeding supplement (ENSURE ENLIVE)  237 mL Oral TID BM  . [MAR Hold] ferrous sulfate  325 mg Oral Q supper  . [MAR Hold] folic acid  1 mg Oral Daily  . [MAR Hold] gabapentin  600 mg Oral QHS  . [MAR Hold] insulin aspart  0-5 Units Subcutaneous QHS  . [MAR Hold] insulin aspart  0-9 Units Subcutaneous TID WC  . [MAR Hold] pantoprazole  40 mg Oral QAC breakfast  . [MAR Hold] predniSONE  50 mg Oral Q breakfast  . [MAR Hold] ramelteon  8 mg Oral QHS  . [MAR Hold] sodium chloride flush  3 mL Intravenous Q12H  . [MAR Hold] umeclidinium-vilanterol  1 puff Inhalation Daily   Continuous Infusions: . [MAR Hold] sodium chloride 250 mL (08/07/19 2042)  . [MAR Hold] ampicillin-sulbactam (UNASYN) IV 3 g (08/08/19 1220)  . lactated ringers 10 mL/hr at 08/08/19 1313   PRN Meds:.[MAR Hold] sodium chloride, 0.9 % irrigation (POUR BTL), [MAR Hold] acetaminophen **OR** [MAR Hold] acetaminophen, EPINEPHrine, [MAR Hold] ipratropium-albuterol, [MAR Hold] ondansetron **OR** [MAR Hold] ondansetron (ZOFRAN) IV, [MAR Hold] sodium chloride flush, [MAR Hold] traMADol  Antimicrobials: Anti-infectives (From admission, onward)   Start     Dose/Rate  Route Frequency Ordered Stop   08/06/19 1800  [MAR Hold]  Ampicillin-Sulbactam (UNASYN) 3 g in sodium chloride 0.9 % 100 mL IVPB     (MAR Hold since Fri 08/08/2019 at 1250.Hold Reason: Transfer to a Procedural area.)   3 g 200 mL/hr over 30 Minutes Intravenous Every 6 hours 08/06/19 1703     08/06/19 1345  azithromycin (ZITHROMAX) 500 mg in sodium chloride 0.9 % 250 mL IVPB  Status:  Discontinued     500 mg 250 mL/hr over 60 Minutes Intravenous Every 24 hours 08/06/19 1321 08/07/19 1234   08/06/19 1330  cefTRIAXone (ROCEPHIN) 2 g in sodium chloride 0.9 % 100 mL IVPB     2 g 200 mL/hr over 30 Minutes Intravenous  Once 08/06/19 1321 08/06/19 1502       I have personally reviewed the following labs and images: CBC: Recent Labs  Lab 08/06/19 1211 08/07/19 0527 08/07/19 1927 08/08/19 0432  WBC 16.6* 20.1* 15.0* 13.7*  NEUTROABS  --  17.5*  --  11.4*  HGB 10.1* 10.3* 9.1* 8.7*  HCT 34.0* 33.8* 29.9* 28.5*  MCV 92.4 89.7 89.8 89.1  PLT 368 333 290 255   BMP &GFR Recent Labs  Lab 08/06/19 1211 08/07/19 0527 08/07/19 1927 08/08/19 0432  NA 137  --  137 139  K 4.3  --  5.1 4.3  CL 97*  --  97* 99  CO2 29  --  29 28  GLUCOSE 157*  --  209* 117*  BUN 21  --  16 13  CREATININE 0.73  --  0.95 0.76  CALCIUM 9.2  --  8.7* 8.8*  MG  --  1.6*  --  2.0  PHOS  --   --   --  3.5   Estimated Creatinine Clearance: 95.4 mL/min (by C-G formula based on SCr of 0.76 mg/dL). Liver & Pancreas: Recent Labs  Lab 08/06/19 1211 08/07/19 1927 08/08/19 0432  AST 15 15  --   ALT 25 22  --   ALKPHOS 59 54  --   BILITOT 0.6 0.5  --   PROT 7.7 6.2*  --   ALBUMIN 3.0* 2.2* 2.2*   No results for input(s): LIPASE, AMYLASE in the last 168 hours. No results for input(s): AMMONIA in the last 168 hours. Diabetic: Recent Labs    08/07/19 0527  HGBA1C 7.0*   Recent Labs  Lab 08/07/19 2102 08/08/19 0608 08/08/19 0758 08/08/19 1151  GLUCAP 132* 102* 115* 129*   Cardiac Enzymes: No results  for input(s): CKTOTAL, CKMB, CKMBINDEX, TROPONINI in the last 168 hours. No results for input(s): PROBNP in the last 8760 hours. Coagulation Profile: Recent Labs  Lab 08/07/19 1927  INR 1.1   Thyroid Function Tests: No results for input(s): TSH, T4TOTAL, FREET4, T3FREE, THYROIDAB in the last 72 hours. Lipid Profile: No results for input(s): CHOL, HDL, LDLCALC, TRIG, CHOLHDL, LDLDIRECT in the last 72 hours. Anemia Panel: Recent Labs    08/08/19 0733  VITAMINB12 370  FOLATE 16.2  FERRITIN 1,049*  TIBC 189*  IRON 23*  RETICCTPCT 1.7   Urine analysis: No results found for: COLORURINE, APPEARANCEUR, LABSPEC, PHURINE, GLUCOSEU, HGBUR, BILIRUBINUR, KETONESUR, PROTEINUR, UROBILINOGEN, NITRITE, LEUKOCYTESUR Sepsis Labs: Invalid input(s): PROCALCITONIN, [place]  Microbiology: Recent Results (from the past 240 hour(s))  Culture, blood (single) w Reflex to ID Panel     Status: None (Preliminary result)   Collection Time: 08/06/19  1:50 PM   Specimen: Left Antecubital; Blood  Result Value Ref Range Status   Specimen Description LEFT ANTECUBITAL  Final   Special Requests   Final    BOTTLES DRAWN AEROBIC AND ANAEROBIC Blood Culture adequate volume   Culture   Final    NO GROWTH 2 DAYS Performed at [facility], [address], [place], [place] [address]    Report Status PENDING  Incomplete  SARS CORONAVIRUS 2 (TAT 6-24 HRS) Nasopharyngeal Nasopharyngeal Swab     Status: None   Collection Time: 08/06/19  2:28 PM   Specimen: Nasopharyngeal Swab  Result Value Ref Range Status   SARS Coronavirus 2 NEGATIVE NEGATIVE Final    Comment: (NOTE) SARS-CoV-2 target nucleic acids are NOT DETECTED. The SARS-CoV-2 RNA is generally detectable in upper and lower respiratory specimens during the acute phase of infection. Negative results do not preclude SARS-CoV-2 infection, do not rule out co-infections with other pathogens, and should not be used as the sole basis for treatment or other  patient management decisions. Negative results must be combined with clinical observations, patient history, and epidemiological information. The expected result is Negative. Fact Sheet for Patients: SugarRoll.be Fact Sheet for Healthcare Providers: https://www.woods-mathews.com/ This test is not yet approved or cleared by the [place] FDA and  has been authorized for detection and/or diagnosis of SARS-CoV-2 by FDA under an Emergency Use  Authorization (EUA). This EUA will remain  in effect (meaning this test can be used) for the duration of the COVID-19 declaration under Section 56 4(b)(1) of the Act, 21 U.S.C. section 360bbb-3(b)(1), unless the authorization is terminated or revoked sooner. Performed at [facility], [place] [address], [place], [place] [address]     Radiology Studies: No results found.    Jeremy [name] T. [place]  If 7PM-7AM, please contact night-coverage [email or web address] Password [facility] 08/08/2019, 2:28 PM

## 2019-08-08 NOTE — Progress Notes (Signed)
Called to PACU for AF- RVR Given esmolol 100  Already , He denies CP, dyspnea HR 130-160s, irregular , BP 97 sys SIS2 irreg  EKG - AF-RVR  Imp - New onset AF-RVR likely related to epi given during procedure for hemostasis  Plan  NS 500 bolus If BP improves , use lopressor 5mg  IV If does not or if RVR persistent, will give amio bolus & gtt  Hold off transfer to [facility] for rad simulation Will need pCU bed on 3W  Critical care time x 66m  Kara Mead MD. Shade Flood. [facility]  If no response to pager , please call 319 [phone]   08/08/2019

## 2019-08-08 NOTE — Procedures (Signed)
Jeremy [name] w/ [place] Note Consent signed in chart Timeout performed Right chest examined with [place] and skin overlying fluid pocket marked Area prepped and anesthesized with 1% lidocaine 120  cc cloudy  fluid removed Bandaid applied to site CXR pending No immediate complications

## 2019-08-08 NOTE — Procedures (Signed)
Bronchoscopy  Indication: Endobronchial mass  Consent: Signed in chart  Anesthesia: General, see anesthesia records  Procedure - After Dr. Elsworth Soho performed bronch/washing (See separate record), bleeding from right mainstem mass controlled with 2.61mm Erbe cryoprobe application. 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

## 2019-08-08 NOTE — Progress Notes (Signed)
Spoke with Courtlanda RN who is covering for the patient's nurse Heather at [facility] to let her know that we are trying to get the patient over for a radiation simulation today at 3:00 pm.  The number to carelink [phone]) was provided for her to set up transportation to the cancer center.  Will continue to follow as necessary.  Gloriajean Dell. Leonie Green, BSN

## 2019-08-08 NOTE — Progress Notes (Addendum)
NAME:  Jeremy [name], MRN:  [id number], DOB:  1945-12-01, LOS: 2 ADMISSION DATE:  08/06/2019, CONSULTATION DATE:  08/07/2019 REFERRING MD:  Dr. Cyndia Skeeters TRH, CHIEF COMPLAINT:  Pulmonary mass  Brief History   74yo male presented with hemoptysis, unintentional weight loss, persistent cough, and night sweats last several days.  Patient is a former smoker quit in 1993 after 25 years of smoking.  PCCM consulted after CTA chest revealed a large necrotic pulmonary mass  History of present illness   Jeremy [name] is a 74 year old male with a past medical history significant for hypertension, diabetes, COPD, former smoker, psoriatic arthritis who presented to the hospital with complaints of hemoptysis that began morning of arrival.  Patient also reports unintentional weight loss of approximately 20 pounds over the last year, cough, and chills.  He also reports progressive shortness of breath for the last couple weeks.  On arrival patient was evaluated for PE with CTA of the chest that revealed large necrotic pulmonary mass extending to the carina with associated obstruction of the right lower lobe bronchus. Lab work on admission significant for slightly elevated BNP, troponin, elevated slightly at 9, and WBC 16.6.  Vital signs on admission significant for mild tachycardia and tachypnea.  PCCM consulted to help assess pulmonary mass with likely need of bronchoscopy.  Past Medical History  Hypertension COPD Type 2 diabetes Former smoker Psoriatic arthritis  Significant Hospital Events   Admitted 3/3  Consults:    Procedures:    Significant Diagnostic Tests:  CTA chest 3/3 >  1. Negative for pulmonary embolus. 2. Large necrotic mass extends to the carina with associated obstruction of the right lower lobe bronchus. Postobstructive collapse/consolidation and necrosis in the right lower lobe. 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 3. 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 4. Borderline right paratracheal and subcarinal lymph nodes, likely metastatic. 5. Aortic atherosclerosis (ICD10-I70.0). Coronary artery calcification.  Micro Data:  COVID 3/3 > negative Blood culture 3/3 > ng  Antimicrobials:  Ceftriaxone 3/3 x1 Unasyn 3/3 >  Interim history/subjective:   Able to lie supine, no distress No chest pain No more hemoptysis overnight  Objective   Blood pressure 130/75, pulse 80, temperature 97.8 F (36.6 C), temperature source Oral, resp. rate (!) 21, height 6\' 3"  (1.905 m), weight 92.4 kg, SpO2 94 %.        Intake/Output Summary (Last 24 hours) at 08/08/2019 1141 Last data filed at 08/08/2019 0606 Gross per 24 hour  Intake 560 ml  Output 200 ml  Net 360 ml   Filed Weights   08/06/19 1111 08/07/19 0312 08/08/19 0500  Weight: 88.8 kg 72.6 kg 92.4 kg    Examination: General: Chronically thin elderly male lying in bed, in NAD HEENT: [place]/AT, MM pink/moist, PERRL,nasal canula in place  Neuro: Alert and oriented x3, non-focal  CV: s1s2 regular rate and rhythm, no murmur, rubs, or gallops,  PULM:  Diminished right with faint rhonchi, left clear , no accessory muscle use GI: soft, bowel sounds active in all 4 quadrants, non-tender, non-distended Extremities: warm/dry, no edema  Skin: no rashes or lesions  Labs show decreasing leukocytosis and worsening anemia  Resolved Hospital Problem list     Assessment & Plan:  Large necrotic pulmonary mass with associated postobstructive pneumonia 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  Patient also reports unintentional weight loss, chills and night sweats Almost certainly primary lung malignancy Plan:  Continue Unasyn Bronchoscopy planned today in OR with use of cryoprobe for  bleeding This mass may occlude the right mainstem bronchus, will involve radiation oncology -have left a message for Dr. Sondra Come  -DC lisinopril meantime  Reviewed images with patient, discussed risks and benefits of procedure, he is willing to proceed  Kara Mead MD. FCCP. [facility]  If no response to pager , please call [phone]   08/08/2019

## 2019-08-08 NOTE — Progress Notes (Deleted)
I assisted Dr. Elsworth Soho with cryobiopsy and control of bleeding for right mainstem mass.  See his note for details.  Erskine Emery MD PCCM

## 2019-08-08 NOTE — Procedures (Signed)
Bronchoscopy Procedure Note Jeremy [name] [id number] 08/20/1945  Procedure: Bronchoscopy Indication : Endobronchial right mainstem tumor with postobstructive pneumonia Written informed consent was obtained prior to th eprocedure. The risks of the procedure including coughing, bleeding and the small chance of lung puncture requiring chest tube were discussed in great detail. The benefits & alternatives including serial follow up were also discussed.  Procedure Details Consent: Risks of procedure as well as the alternatives and risks of each were explained to the (patient/caregiver).  Consent for procedure obtained. Time Out: Verified patient identification, verified procedure, site/side was marked, verified correct patient position, special equipment/implants available, medications/allergies/relevent history reviewed, required imaging and test results available.  Performed    General anesthesia was provided Bronchoscope entered from the ET tube Large tumor seen blocking right mainstem bronchus all the way to the carina , covered with purulent secretions which were suctioned off .  Bronchial washings were is obtained from the right mainstem.  The tumor was washed with cold saline and diluted epinephrine .  Cryoprobe was then used by Dr Tamala Julian to obtain multiple biopsies from this tumor.  There was bleeding which was controlled with cryoprobe and another aliquot of 1: 1000 epinephrine diluted to 20 cc.  Good hemostasis was achieved prior to withdrawing bronchoscope  Frozen section showed squamous cell carcinoma  Evaluation Hemodynamic Status: BP stable throughout; O2 sats: stable throughout Patient's Current Condition: stable Specimens: BAL for cytology, endobronchial tumor biopsy for pathology Complications: No apparent complications Patient did tolerate procedure well , will be extubated by anesthesia and recovered in the PACU   Jeremy [name] 08/08/2019

## 2019-08-08 NOTE — Interval H&P Note (Signed)
History and Physical Interval Note:  08/08/2019 1:29 PM  Jeremy [name]  has presented today for surgery, with the diagnosis of LUNG MASS.  The various methods of treatment have been discussed with the patient and family. After consideration of risks, benefits and other options for treatment, the patient has consented to  Procedure(s) with comments: [place] (N/A) - HAVE CRYO PROBE AVAILABLE as a surgical intervention.  The patient's history has been reviewed, patient examined, no change in status, stable for surgery.  I have reviewed the patient's chart and labs.  Questions were answered to the patient's satisfaction.     [name]

## 2019-08-08 NOTE — Anesthesia Postprocedure Evaluation (Signed)
Anesthesia Post Note  Patient: Jeremy [name]  Procedure(s) Performed: Thoracentesis (Right Chest) Video Bronchoscopy with Erbe Cryo Biopsy of Right mainstem (Right )     Patient location during evaluation: PACU Anesthesia Type: General Level of consciousness: awake and alert Pain management: pain level controlled Vital Signs Assessment: post-procedure vital signs reviewed and stable Respiratory status: spontaneous breathing, nonlabored ventilation, respiratory function stable and patient connected to nasal cannula oxygen Cardiovascular status: blood pressure returned to baseline and stable Postop Assessment: no apparent nausea or vomiting Anesthetic complications: no    Last Vitals:  Vitals:   08/08/19 1538 08/08/19 1553  BP: 130/82 104/79  Pulse:  76  Resp: (!) 21 18  Temp:    SpO2: 96% 100%    Last Pain:  Vitals:   08/08/19 1522  TempSrc:   PainSc: 0-No pain                 Barnet Glasgow

## 2019-08-08 NOTE — Transfer of Care (Signed)
Immediate Anesthesia Transfer of Care Note  Patient: Jeremy [name]  Procedure(s) Performed: Thoracentesis (Right Chest) Video Bronchoscopy with Erbe Cryo Biopsy of Right mainstem (Right )  Patient Location: PACU  Anesthesia Type:General  Level of Consciousness: awake and patient cooperative  Airway & Oxygen Therapy: Patient Spontanous Breathing and Patient connected to face mask oxygen  Post-op Assessment: Report given to RN and Post -op Vital signs reviewed and unstable, Anesthesiologist notified  Post vital signs: Reviewed and stable  Last Vitals:  Vitals Value Taken Time  BP 89/76 08/08/19 1446  Temp 36.5 C 08/08/19 1440  Pulse 138 08/08/19 1449  Resp 23 08/08/19 1451  SpO2 86 % 08/08/19 1449  Vitals shown include unvalidated device data.  Last Pain:  Vitals:   08/08/19 1151  TempSrc: Oral  PainSc:          Complications: No apparent anesthesia complications

## 2019-08-08 NOTE — Anesthesia Preprocedure Evaluation (Signed)
Anesthesia Evaluation  Patient identified by MRN, date of birth, ID band Patient awake    Reviewed: Allergy & Precautions, NPO status , Patient's Chart, lab work & pertinent test results  Airway Mallampati: II  TM Distance: >3 FB Neck ROM: Full    Dental no notable dental hx. (+) Teeth Intact, Dental Advisory Given   Pulmonary pneumonia, COPD, former smoker,    Pulmonary exam normal breath sounds clear to auscultation       Cardiovascular hypertension, Pt. on medications Normal cardiovascular exam Rhythm:Regular Rate:Normal  BNP 104   Neuro/Psych negative neurological ROS  negative psych ROS   GI/Hepatic negative GI ROS, Neg liver ROS,   Endo/Other  diabetes  Renal/GU      Musculoskeletal  (+) Arthritis ,   Abdominal   Peds  Hematology  (+) anemia , Hgb 8.7    Anesthesia Other Findings   Reproductive/Obstetrics                            Anesthesia Physical Anesthesia Plan  ASA: III  Anesthesia Plan: General   Post-op Pain Management:    Induction: Intravenous  PONV Risk Score and Plan: 3 and Treatment may vary due to age or medical condition, Ondansetron and Dexamethasone  Airway Management Planned: Oral ETT  Additional Equipment: None  Intra-op Plan:   Post-operative Plan: Extubation in OR  Informed Consent:     Dental advisory given  Plan Discussed with:   Anesthesia Plan Comments:         Anesthesia Quick Evaluation

## 2019-08-08 NOTE — Anesthesia Procedure Notes (Signed)
Procedure Name: Intubation Date/Time: 08/08/2019 1:42 PM Performed by: Renato Shin, CRNA Pre-anesthesia Checklist: Patient identified, Emergency Drugs available, Suction available and Patient being monitored Patient Re-evaluated:Patient Re-evaluated prior to induction Oxygen Delivery Method: Circle system utilized Preoxygenation: Pre-oxygenation with 100% oxygen Induction Type: IV induction Ventilation: Mask ventilation without difficulty Laryngoscope Size: Miller and 3 Grade View: Grade I Tube type: Oral Tube size: 8.5 mm Number of attempts: 1 Airway Equipment and Method: Stylet and Oral airway Placement Confirmation: ETT inserted through vocal cords under direct vision,  positive ETCO2 and breath sounds checked- equal and bilateral Secured at: 22 cm Tube secured with: Tape Dental Injury: Teeth and Oropharynx as per pre-operative assessment

## 2019-08-08 NOTE — Progress Notes (Signed)
Pharmacy Antibiotic Note  Jeremy [name] is a 74 y.o. male admitted on 08/06/2019 with post-obstructive pneumonia.  Pharmacy has been consulted for Unasyn dosing. Plan is for bronch today. He is afebrile, WBC are normal, and renal function is stable. Prolonged abx course planned.   Plan: Continue Unasyn 3g IV q6h Monitor renal function, cultures and patient progress   Height: 6\' 3"  (190.5 cm) Weight: 203 lb 11.3 oz (92.4 kg) IBW/kg (Calculated) : 84.5  Temp (24hrs), Avg:97.8 F (36.6 C), Min:97.5 F (36.4 C), Max:98 F (36.7 C)  Recent Labs  Lab 08/06/19 1211 08/07/19 0527 08/07/19 1927 08/08/19 0432  WBC 16.6* 20.1* 15.0* 13.7*  CREATININE 0.73  --  0.95 0.76    Estimated Creatinine Clearance: 95.4 mL/min (by C-G formula based on SCr of 0.76 mg/dL).    Allergies  Allergen Reactions  . Aleve [Naproxen Sodium] Hives and Rash    Antimicrobials this admission: Unasyn 3/3>>  azithromycin 3/3 x1 ceftriaxone 3/3 x1   Microbiology results: 3/3 BC x1:  ngtd         SARS CoV-2:  neg                 Thank you for involving pharmacy in this patient's care.  Renold Genta, PharmD, BCPS Clinical Pharmacist Clinical phone for 08/08/2019 until 3p is [phone] 08/08/2019 11:19 AM  **Pharmacist phone directory can be found on [email or web address] listed under [place]**

## 2019-08-09 DIAGNOSIS — I480 Paroxysmal atrial fibrillation: Secondary | ICD-10-CM

## 2019-08-09 LAB — CBC WITH DIFFERENTIAL/PLATELET
Abs Immature Granulocytes: 0.21 10*3/uL — ABNORMAL HIGH (ref 0.00–0.07)
Basophils Absolute: 0 10*3/uL (ref 0.0–0.1)
Basophils Relative: 0 %
Eosinophils Absolute: 0 10*3/uL (ref 0.0–0.5)
Eosinophils Relative: 0 %
HCT: 29.6 % — ABNORMAL LOW (ref 39.0–52.0)
Hemoglobin: 9 g/dL — ABNORMAL LOW (ref 13.0–17.0)
Immature Granulocytes: 1 %
Lymphocytes Relative: 6 %
Lymphs Abs: 1.1 10*3/uL (ref 0.7–4.0)
MCH: 27.3 pg (ref 26.0–34.0)
MCHC: 30.4 g/dL (ref 30.0–36.0)
MCV: 89.7 fL (ref 80.0–100.0)
Monocytes Absolute: 0.7 10*3/uL (ref 0.1–1.0)
Monocytes Relative: 4 %
Neutro Abs: 17.3 10*3/uL — ABNORMAL HIGH (ref 1.7–7.7)
Neutrophils Relative %: 89 %
Platelets: 308 10*3/uL (ref 150–400)
RBC: 3.3 MIL/uL — ABNORMAL LOW (ref 4.22–5.81)
RDW: 17.8 % — ABNORMAL HIGH (ref 11.5–15.5)
WBC: 19.3 10*3/uL — ABNORMAL HIGH (ref 4.0–10.5)
nRBC: 0 % (ref 0.0–0.2)

## 2019-08-09 LAB — GLUCOSE, CAPILLARY
Glucose-Capillary: 130 mg/dL — ABNORMAL HIGH (ref 70–99)
Glucose-Capillary: 119 mg/dL — ABNORMAL HIGH (ref 70–99)
Glucose-Capillary: 244 mg/dL — ABNORMAL HIGH (ref 70–99)
Glucose-Capillary: 301 mg/dL — ABNORMAL HIGH (ref 70–99)
Glucose-Capillary: 123 mg/dL — ABNORMAL HIGH (ref 70–99)

## 2019-08-09 LAB — ACID FAST SMEAR (AFB, MYCOBACTERIA): Acid Fast Smear: NEGATIVE

## 2019-08-09 LAB — RENAL FUNCTION PANEL
Albumin: 2.1 g/dL — ABNORMAL LOW (ref 3.5–5.0)
Anion gap: 11 (ref 5–15)
BUN: 21 mg/dL (ref 8–23)
CO2: 26 mmol/L (ref 22–32)
Calcium: 8.6 mg/dL — ABNORMAL LOW (ref 8.9–10.3)
Chloride: 101 mmol/L (ref 98–111)
Creatinine, Ser: 0.88 mg/dL (ref 0.61–1.24)
GFR calc Af Amer: >60 mL/min (ref >60)
GFR calc non Af Amer: >60 mL/min (ref >60)
Glucose, Bld: 162 mg/dL — ABNORMAL HIGH (ref 70–99)
Phosphorus: 3.8 mg/dL (ref 2.5–4.6)
Potassium: 4.6 mmol/L (ref 3.5–5.1)
Sodium: 138 mmol/L (ref 135–145)

## 2019-08-09 LAB — MAGNESIUM: Magnesium: 1.9 mg/dL (ref 1.7–2.4)

## 2019-08-09 MED ORDER — DILTIAZEM HCL 30 MG PO TABS: 30.0000 mg | ORAL_TABLET | Freq: Four times a day (QID) | ORAL | Status: DC | PRN

## 2019-08-09 MED ORDER — AMOXICILLIN-POT CLAVULANATE 875-125 MG PO TABS
1.0000 | ORAL_TABLET | Freq: Two times a day (BID) | ORAL | Status: DC
  Administered 2019-08-09 – 2019-08-12 (×6): 1 via ORAL
  Filled 2019-08-09 (×6): qty 1

## 2019-08-09 MED ORDER — PREDNISONE 5 MG PO TABS
10.0000 mg | ORAL_TABLET | Freq: Every day | ORAL | Status: DC
  Administered 2019-08-10 – 2019-08-12 (×3): 10 mg via ORAL
  Filled 2019-08-09 (×3): qty 2

## 2019-08-09 MED ORDER — MAGNESIUM SULFATE 2 GM/50ML IV SOLN
2.0000 g | Freq: Once | INTRAVENOUS | Status: AC
  Administered 2019-08-09: 2 g via INTRAVENOUS
  Filled 2019-08-09: qty 50

## 2019-08-09 NOTE — Progress Notes (Addendum)
NAME:  Jeremy [name], MRN:  [id number], DOB:  1946/05/05, LOS: 3 ADMISSION DATE:  08/06/2019, CONSULTATION DATE:  08/07/2019 REFERRING MD:  Dr. Cyndia Skeeters TRH, CHIEF COMPLAINT:  Pulmonary mass  Brief History   74yo male presented with hemoptysis, unintentional weight loss, persistent cough, and night sweats last several days.  Patient is a former smoker quit in 1993 after 25 years of smoking.  PCCM consulted after CTA chest revealed a large necrotic pulmonary mass  History of present illness   Jeremy [name] is a 74 year old male with a past medical history significant for hypertension, diabetes, COPD, former smoker, psoriatic arthritis who presented to the hospital with complaints of hemoptysis that began morning of arrival.  Patient also reports unintentional weight loss of approximately 20 pounds over the last year, cough, and chills.  He also reports progressive shortness of breath for the last couple weeks.  On arrival patient was evaluated for PE with CTA of the chest that revealed large necrotic pulmonary mass extending to the carina with associated obstruction of the right lower lobe bronchus. Lab work on admission significant for slightly elevated BNP, troponin, elevated slightly at 9, and WBC 16.6.  Vital signs on admission significant for mild tachycardia and tachypnea.  PCCM consulted to help assess pulmonary mass with likely need of bronchoscopy.  Past Medical History  Hypertension COPD Type 2 diabetes Former smoker Psoriatic arthritis  Significant Hospital Events   Admitted 3/3  Consults:    Procedures:    Significant Diagnostic Tests:  CTA chest 3/3 >  1. Negative for pulmonary embolus. 2. Large necrotic mass extends to the carina with associated obstruction of the right lower lobe bronchus. Postobstructive collapse/consolidation and necrosis in the right lower lobe. 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 3. 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 4. Borderline right paratracheal and subcarinal lymph nodes, likely metastatic. 5. Aortic atherosclerosis (ICD10-I70.0). Coronary artery calcification.  Micro Data:  COVID 3/3 > negative Blood culture 3/3 > ng  Antimicrobials:  Ceftriaxone 3/3 x1 Unasyn 3/3 >  Interim history/subjective:  Doing well this AM. Afib gone. No hemoptysis. Denies chest pain. Sugars are high.  Objective   Blood pressure (!) 131/94, pulse 87, temperature 98 F (36.7 C), temperature source Oral, resp. rate (!) 22, height 6\' 3"  (1.905 m), weight 92.4 kg, SpO2 94 %.        Intake/Output Summary (Last 24 hours) at 08/09/2019 1724 Last data filed at 08/09/2019 0945 Gross per 24 hour  Intake 1073.65 ml  Output 250 ml  Net 823.65 ml   Filed Weights   08/06/19 1111 08/07/19 0312 08/08/19 0500  Weight: 88.8 kg 72.6 kg 92.4 kg    Examination: GEN: well appearing man in NAD HEENT: MMM, no thrush CV: RRR, ext warm PULM: Diminished on R, clear left, no accesory muscle use GI: Soft, +BS EXT: No edema NEURO: Moves all 4 ext, no obvious deficits PSYCH: AOx3, hard of hearing SKIN: No rashes   WBC up a bit after procedure, Hgb stable  Resolved Hospital Problem list     Assessment & Plan:  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- post bronch 3/5 - Would like him mapped and at least first radiation treatment started prior to discharge - Given reports of ataxia and confusion by wife, will get MRI brain to complete staging - f/u final path/stains from  endobronchial tumor  Associated pleural effusion- f/u path, looks malignant  Afib- reactive to epi/procedure, resolved, further management per primary, would avoid AC given how friable his tumor is  Postobstructive PNA- narrow abx to augmentin x 7 more days then stop  Hyperglycemia-  management per primary  Will f/u Monday either here at [facility] or [facility]  Wife updated by phone  Erskine Emery MD PCCM

## 2019-08-09 NOTE — Progress Notes (Signed)
PROGRESS NOTE  Jeremy [name] UMP:[id number] DOB: 1945-07-05   PCP: [name], MD  Patient is from: home.  Lives with his wife.  DOA: 08/06/2019 LOS: 3  Brief Narrative / Interim history: 74 year old man with history of COPD not on oxygen, former smoker, HTN, DM-2 and psoriatic arthritis presented [facility] with cough and hemoptysis, shortness of breath, chills and unintentional weight loss of about 20 pounds over last year.    In ED, slight tachycardia and tachypnea.  CTA chest negative for PE but large necrotic pulmonary mass extending to the carina with associated obstruction of the RLL.  WBC 16.6.  He was admitted to [facility] for bronchoscopy by pulmonology.  He was started on IV Unasyn for possible postobstructive pneumonia.  Also started on systemic steroid for COPD.   Pulmonology consulted on 3/4.  Patient had bronchoscopy with biopsy on 08/08/2019 that revealed large friable mass occluding R mainstem and signs of severe chronic bronchitis in left lung.  Per PCCM, frozen section showed squamous cell carcinoma.  Patient developed A. fib with RVR after bronchoscopy likely due to epinephrine used for hemostasis.  He was a started on amiodarone drip and converted to NSR.  Subjective: No major events overnight or this morning.  Remains in NSR.  Thinks cough and hemoptysis has improved.  He denies chest pain, dyspnea, palpitation, dizziness, GI or UTI symptoms.  Objective: Vitals:   08/09/19 0801 08/09/19 0802 08/09/19 0803 08/09/19 0807  BP:      Pulse:      Resp: 18 (!) 26 (!) 22   Temp:    (!) 97.4 F (36.3 C)  TempSrc:    Oral  SpO2:    93%  Weight:      Height:        Intake/Output Summary (Last 24 hours) at 08/09/2019 1142 Last data filed at 08/09/2019 0945 Gross per 24 hour  Intake 2299.78 ml  Output 300 ml  Net 1999.78 ml   Filed Weights   08/06/19 1111 08/07/19 0312 08/08/19 0500  Weight: 88.8 kg 72.6 kg 92.4 kg    Examination:  GENERAL: No acute distress.   Appears well.  HEENT: MMM.  Vision and hearing grossly intact.  NECK: Supple.  No apparent JVD.  RESP: On RA.  No IWOB.  Fair aeration bilaterally.  Slightly diminished on the right. CVS:  RRR. Heart sounds normal.  ABD/GI/GU: Bowel sounds present. Soft. Non tender.  MSK/EXT:  Moves extremities. No apparent deformity. No edema.  SKIN: no apparent skin lesion or wound NEURO: Awake, alert and oriented appropriately.  No apparent focal neuro deficit. PSYCH: Calm. Normal affect.    Procedures:  3/5-bronchoscopy-large friable mass occluding the right mainstem and signs of severe chronic bronchitis in left lung. Frozen section showed squamous cell carcinoma.  Assessment & Plan: RLL necrotic mass with mass-effect: concerning for primary lung cancer. Hemoptysis-likely due to lung mass.  Coag and platelet within normal. Postobstructive pneumonia: RLL mass with compression on right bronchus -Bronchoscopy on 3/5 as above.  Pathology pending. -PCCM contacting radiation oncology -Continue IV Unasyn   COPD exacerbation-heavy tobacco use history-25-pack-year before 1993.  Dyspnea resolved. -LAMA/LABA and as needed nebulizers -Decrease prednisone to 10 mg daily  Paroxysmal A. fib with RVR: No history of this.  Likely provoked by epinephrine used for hemostasis during bronchoscopy.  Started on IV amiodarone due to soft blood pressures and converted to NSR. -Discontinue IV amiodarone -P.o. Cardizem 30 mg as needed for sustained HR > 120.  Hold if SBP less than 100. -Doubt need for anticoagulation  Controlled DM-2 with hyperglycemia: A1c 7.0%.  On Metformin at home. Recent Labs    08/08/19 2238 08/09/19 0614 08/09/19 0759  GLUCAP 375* 130* 119*  -CBG monitoring and SSI-thin -Adjust insulin as appropriate -Initiate statin prior to discharge  Essential hypertension: Normotensive -As needed Cardizem as above  Psoriatic arthritis: Stable.  On methotrexate and low-dose prednisone at  home. -Hold methotrexate while treating for pneumonia -Prednisone as above  Hypomagnesemia-resolved -Replenish and recheck  Normocytic anemia: Hgb 10.2 (12/2018)> 10.1 (admit)> 8.7> 9.0.  Iron sat 12%.   -Continue ferrous sulfate -Monitor H&H  Leukocytosis/bandemia: Likely due to obstructive pneumonia and chronic steroid use.  Improving -Continue monitoring    Severe protein calorie malnutrition//unintentional weight loss-reportedly about 20 pounds weight loss in the last 12 months. Nutrition Problem: Severe Malnutrition Etiology: chronic illness(COPD, lung mass)  Signs/Symptoms: energy intake < or equal to 75% for > or equal to 1 month, severe fat depletion, percent weight loss(17% weight loss within 6 months) Percent weight loss: 17 %  Interventions: Ensure Enlive (each supplement provides 350kcal and 20 grams of protein)   DVT prophylaxis: SCD in the setting of hemoptysis Code Status: Full code Family Communication: Patient and RN.  Available if any question.  Discharge barrier: Evaluation by expert including bronchoscopy, and treatment of postobstructive pneumonia and hemoptysis Patient is from: Home Final disposition: Likely home after evaluation and treatment of lung mass, hemoptysis and postobstructive pneumonia  Consultants: PCCM, radiation oncology   Microbiology summarized: COVID-19 negative Blood culture-negative  Sch Meds:  Scheduled Meds: . vitamin C  1,000 mg Oral Daily  . budesonide  0.25 mg Inhalation BID  . feeding supplement (ENSURE ENLIVE)  237 mL Oral TID BM  . ferrous sulfate  325 mg Oral Q supper  . folic acid  1 mg Oral Daily  . gabapentin  600 mg Oral QHS  . insulin aspart  0-5 Units Subcutaneous QHS  . insulin aspart  0-9 Units Subcutaneous TID WC  . pantoprazole  40 mg Oral QAC breakfast  . [START ON 08/10/2019] predniSONE  10 mg Oral Q breakfast  . ramelteon  8 mg Oral QHS  . sodium chloride flush  3 mL Intravenous Q12H  .  umeclidinium-vilanterol  1 puff Inhalation Daily   Continuous Infusions: . sodium chloride 10 mL/hr at 08/09/19 0200  . ampicillin-sulbactam (UNASYN) IV 3 g (08/09/19 0534)   PRN Meds:.sodium chloride, acetaminophen **OR** acetaminophen, diltiazem, ipratropium-albuterol, metoprolol tartrate, ondansetron **OR** ondansetron (ZOFRAN) IV, sodium chloride flush, traMADol  Antimicrobials: Anti-infectives (From admission, onward)   Start     Dose/Rate Route Frequency Ordered Stop   08/06/19 1800  Ampicillin-Sulbactam (UNASYN) 3 g in sodium chloride 0.9 % 100 mL IVPB     3 g 200 mL/hr over 30 Minutes Intravenous Every 6 hours 08/06/19 1703     08/06/19 1345  azithromycin (ZITHROMAX) 500 mg in sodium chloride 0.9 % 250 mL IVPB  Status:  Discontinued     500 mg 250 mL/hr over 60 Minutes Intravenous Every 24 hours 08/06/19 1321 08/07/19 1234   08/06/19 1330  cefTRIAXone (ROCEPHIN) 2 g in sodium chloride 0.9 % 100 mL IVPB     2 g 200 mL/hr over 30 Minutes Intravenous  Once 08/06/19 1321 08/06/19 1502       I have personally reviewed the following labs and images: CBC: Recent Labs  Lab 08/06/19 1211 08/07/19 0527 08/07/19 1927 08/08/19 0432 08/09/19 0414  WBC 16.6*  20.1* 15.0* 13.7* 19.3*  NEUTROABS  --  17.5*  --  11.4* 17.3*  HGB 10.1* 10.3* 9.1* 8.7* 9.0*  HCT 34.0* 33.8* 29.9* 28.5* 29.6*  MCV 92.4 89.7 89.8 89.1 89.7  PLT 368 333 290 255 308   BMP &GFR Recent Labs  Lab 08/06/19 1211 08/07/19 0527 08/07/19 1927 08/08/19 0432 08/09/19 0414  NA 137  --  137 139 138  K 4.3  --  5.1 4.3 4.6  CL 97*  --  97* 99 101  CO2 29  --  29 28 26   GLUCOSE 157*  --  209* 117* 162*  BUN 21  --  16 13 21   CREATININE 0.73  --  0.95 0.76 0.88  CALCIUM 9.2  --  8.7* 8.8* 8.6*  MG  --  1.6*  --  2.0 1.9  PHOS  --   --   --  3.5 3.8   Estimated Creatinine Clearance: 86.7 mL/min (by C-G formula based on SCr of 0.88 mg/dL). Liver & Pancreas: Recent Labs  Lab 08/06/19 1211 08/07/19 1927  08/08/19 0432 08/09/19 0414  AST 15 15  --   --   ALT 25 22  --   --   ALKPHOS 59 54  --   --   BILITOT 0.6 0.5  --   --   PROT 7.7 6.2*  --   --   ALBUMIN 3.0* 2.2* 2.2* 2.1*   No results for input(s): LIPASE, AMYLASE in the last 168 hours. No results for input(s): AMMONIA in the last 168 hours. Diabetic: Recent Labs    08/07/19 0527  HGBA1C 7.0*   Recent Labs  Lab 08/08/19 1657 08/08/19 2123 08/08/19 2238 08/09/19 0614 08/09/19 0759  GLUCAP 192* 306* 375* 130* 119*   Cardiac Enzymes: No results for input(s): CKTOTAL, CKMB, CKMBINDEX, TROPONINI in the last 168 hours. No results for input(s): PROBNP in the last 8760 hours. Coagulation Profile: Recent Labs  Lab 08/07/19 1927  INR 1.1   Thyroid Function Tests: No results for input(s): TSH, T4TOTAL, FREET4, T3FREE, THYROIDAB in the last 72 hours. Lipid Profile: No results for input(s): CHOL, HDL, LDLCALC, TRIG, CHOLHDL, LDLDIRECT in the last 72 hours. Anemia Panel: Recent Labs    08/08/19 0733  VITAMINB12 370  FOLATE 16.2  FERRITIN 1,049*  TIBC 189*  IRON 23*  RETICCTPCT 1.7   Urine analysis: No results found for: COLORURINE, APPEARANCEUR, LABSPEC, PHURINE, GLUCOSEU, HGBUR, BILIRUBINUR, KETONESUR, PROTEINUR, UROBILINOGEN, NITRITE, LEUKOCYTESUR Sepsis Labs: Invalid input(s): PROCALCITONIN, [place]  Microbiology: Recent Results (from the past 240 hour(s))  Culture, blood (single) w Reflex to ID Panel     Status: None (Preliminary result)   Collection Time: 08/06/19  1:50 PM   Specimen: Left Antecubital; Blood  Result Value Ref Range Status   Specimen Description LEFT ANTECUBITAL  Final   Special Requests   Final    BOTTLES DRAWN AEROBIC AND ANAEROBIC Blood Culture adequate volume   Culture   Final    NO GROWTH 3 DAYS Performed at [facility], [address] [place], [place] [address]    Report Status PENDING  Incomplete  SARS CORONAVIRUS 2 (TAT 6-24 HRS) Nasopharyngeal Nasopharyngeal Swab      Status: None   Collection Time: 08/06/19  2:28 PM   Specimen: Nasopharyngeal Swab  Result Value Ref Range Status   SARS Coronavirus 2 NEGATIVE NEGATIVE Final    Comment: (NOTE) SARS-CoV-2 target nucleic acids are NOT DETECTED. The SARS-CoV-2 RNA is generally detectable in upper and lower  respiratory specimens during the acute phase of infection. Negative results do not preclude SARS-CoV-2 infection, do not rule out co-infections with other pathogens, and should not be used as the sole basis for treatment or other patient management decisions. Negative results must be combined with clinical observations, patient history, and epidemiological information. The expected result is Negative. Fact Sheet for Patients: SugarRoll.be Fact Sheet for Healthcare Providers: https://www.woods-mathews.com/ This test is not yet approved or cleared by the [place] FDA and  has been authorized for detection and/or diagnosis of SARS-CoV-2 by FDA under an Emergency Use Authorization (EUA). This EUA will remain  in effect (meaning this test can be used) for the duration of the COVID-19 declaration under Section 56 4(b)(1) of the Act, 21 U.S.C. section 360bbb-3(b)(1), unless the authorization is terminated or revoked sooner. Performed at [facility], [place] [address] [place], [place] [address]   Culture, respiratory     Status: None (Preliminary result)   Collection Time: 08/08/19  2:06 PM   Specimen: Bronchial Washing, Right; Respiratory  Result Value Ref Range Status   Specimen Description BRONCHIAL WASHINGS RIGHT  Final   Special Requests NONE  Final   Gram Stain   Final    FEW WBC PRESENT,BOTH PMN AND MONONUCLEAR NO ORGANISMS SEEN    Culture   Final    NO GROWTH < 24 HOURS Performed at [facility], [place] [address] [place], [place] [address]    Report Status PENDING  Incomplete  Body fluid culture     Status: None (Preliminary result)    Collection Time: 08/08/19  2:17 PM   Specimen: PATH Cytology Pleural fluid; Body Fluid  Result Value Ref Range Status   Specimen Description FLUID RIGHT PLEURAL  Final   Special Requests NONE  Final   Gram Stain   Final    ABUNDANT WBC PRESENT, PREDOMINANTLY MONONUCLEAR NO ORGANISMS SEEN    Culture   Final    NO GROWTH < 24 HOURS Performed at [facility] [address] [place], [place] [address]    Report Status PENDING  Incomplete    Radiology Studies: No results found.    Kamber Vignola T. [place]  If 7PM-7AM, please contact night-coverage [email or web address] Password [facility] 08/09/2019, 11:42 AM

## 2019-08-10 ENCOUNTER — Inpatient Hospital Stay (HOSPITAL_COMMUNITY): Payer: Medicare Other

## 2019-08-10 ENCOUNTER — Encounter: Payer: Self-pay | Admitting: Radiation Oncology

## 2019-08-10 LAB — GLUCOSE, CAPILLARY
Glucose-Capillary: 94 mg/dL (ref 70–99)
Glucose-Capillary: 218 mg/dL — ABNORMAL HIGH (ref 70–99)
Glucose-Capillary: 106 mg/dL — ABNORMAL HIGH (ref 70–99)

## 2019-08-10 LAB — CULTURE, RESPIRATORY W GRAM STAIN: Culture: NO GROWTH

## 2019-08-10 LAB — CBC
HCT: 26.3 % — ABNORMAL LOW (ref 39.0–52.0)
Hemoglobin: 8.2 g/dL — ABNORMAL LOW (ref 13.0–17.0)
MCH: 27.7 pg (ref 26.0–34.0)
MCHC: 31.2 g/dL (ref 30.0–36.0)
MCV: 88.9 fL (ref 80.0–100.0)
Platelets: 299 10*3/uL (ref 150–400)
RBC: 2.96 MIL/uL — ABNORMAL LOW (ref 4.22–5.81)
RDW: 17.9 % — ABNORMAL HIGH (ref 11.5–15.5)
WBC: 14 10*3/uL — ABNORMAL HIGH (ref 4.0–10.5)
nRBC: 0 % (ref 0.0–0.2)

## 2019-08-10 LAB — RENAL FUNCTION PANEL
Albumin: 2 g/dL — ABNORMAL LOW (ref 3.5–5.0)
Anion gap: 9 (ref 5–15)
BUN: 18 mg/dL (ref 8–23)
CO2: 29 mmol/L (ref 22–32)
Calcium: 8.3 mg/dL — ABNORMAL LOW (ref 8.9–10.3)
Chloride: 101 mmol/L (ref 98–111)
Creatinine, Ser: 0.71 mg/dL (ref 0.61–1.24)
GFR calc Af Amer: >60 mL/min (ref >60)
GFR calc non Af Amer: >60 mL/min (ref >60)
Glucose, Bld: 104 mg/dL — ABNORMAL HIGH (ref 70–99)
Phosphorus: 3.3 mg/dL (ref 2.5–4.6)
Potassium: 3.9 mmol/L (ref 3.5–5.1)
Sodium: 139 mmol/L (ref 135–145)

## 2019-08-10 LAB — MAGNESIUM: Magnesium: 2 mg/dL (ref 1.7–2.4)

## 2019-08-10 MED ORDER — GADOBUTROL 1 MMOL/ML IV SOLN
8.0000 mL | Freq: Once | INTRAVENOUS | Status: AC | PRN
  Administered 2019-08-10: 8 mL via INTRAVENOUS

## 2019-08-10 NOTE — Progress Notes (Signed)
Will check on patient tomorrow, hopefully he is at [facility] for mapping by then.  Erskine Emery MD PCCM

## 2019-08-10 NOTE — Progress Notes (Signed)
PROGRESS NOTE  Jeremy [name] OMB:[id number] DOB: 1945-10-18   PCP: [name], MD  Patient is from: home.  Lives with his wife.  DOA: 08/06/2019 LOS: 4  Brief Narrative / Interim history: 74 year old man with history of COPD not on oxygen, former smoker, HTN, DM-2 and psoriatic arthritis presented to [facility] with cough and hemoptysis, shortness of breath, chills and unintentional weight loss of about 20 pounds over last year.    In ED, slight tachycardia and tachypnea.  CTA chest negative for PE but large necrotic pulmonary mass extending to the carina with associated obstruction of the RLL.  WBC 16.6.  He was admitted to [facility] for bronchoscopy by pulmonology.  He was started on IV Unasyn for possible postobstructive pneumonia.  Also started on systemic steroid for COPD.   Pulmonology consulted on 3/4.  Patient had bronchoscopy with biopsy on 08/08/2019 that revealed large friable mass occluding R mainstem and signs of severe chronic bronchitis in left lung.  Per PCCM, frozen section showed squamous cell carcinoma.  Patient developed A. fib with RVR after bronchoscopy likely due to epinephrine used for hemostasis.  He was a started on amiodarone drip and converted to NSR.  He was transitioned to p.o. Cardizem as needed but didn't need.  Patient transferred to [facility] long for evaluation and treatment by radiation oncology.  Subjective: No major events overnight or this morning.  Remains in NSR without medication.  He did not need Cardizem.  No complaints other than difficulty sleeping in the hospital.  Still with some intermittent cough but no hemoptysis.  Denies chest pain, dyspnea, nausea, vomiting, abdominal pain or hematochezia.  He states that his stool is "black".  He is also on iron.  Objective: Vitals:   08/09/19 2352 08/10/19 0451 08/10/19 0500 08/10/19 0811  BP: 129/67 122/78  127/72  Pulse: 62 64  79  Resp: 18 18  18   Temp: (!) 97.5 F (36.4 C) 97.6 F (36.4 C)  97.6 F  (36.4 C)  TempSrc: Oral Oral  Oral  SpO2: 93% 94%  95%  Weight:   81.8 kg   Height:        Intake/Output Summary (Last 24 hours) at 08/10/2019 0944 Last data filed at 08/10/2019 0750 Gross per 24 hour  Intake 985 ml  Output --  Net 985 ml   Filed Weights   08/07/19 0312 08/08/19 0500 08/10/19 0500  Weight: 72.6 kg 92.4 kg 81.8 kg    Examination:  GENERAL: No acute distress.  Appears well.  HEENT: MMM.  Vision and hearing grossly intact.  NECK: Supple.  No apparent JVD.  RESP: On RA.  No IWOB.  Fair aeration bilaterally. CVS:  RRR. Heart sounds normal.  ABD/GI/GU: Bowel sounds present. Soft. Non tender.  MSK/EXT:  Moves extremities. No apparent deformity. No edema.  SKIN: no apparent skin lesion or wound NEURO: Awake, alert and oriented appropriately.  No apparent focal neuro deficit. PSYCH: Calm. Normal affect.   Procedures:  3/5-bronchoscopy-large friable mass occluding the right mainstem and signs of severe chronic bronchitis in left lung. Frozen section showed squamous cell carcinoma.  Assessment & Plan: RLL necrotic mass with mass-effect: concerning for primary lung cancer. Hemoptysis-likely due to lung mass.  Coag and platelet within normal. Postobstructive pneumonia: RLL mass with compression on right bronchus -Bronchoscopy on 3/5 as above.  Final pathology result pending. -Follow MRI brain -Transferred to [facility] for radiation oncology -Continue IV Unasyn 3/3> 3/6.  Augmentin 3/6>  COPD exacerbation-heavy tobacco use  history-25-pack-year before 1993.  Dyspnea resolved. -LAMA/LABA and as needed nebulizers -Reduced prednisone to 10 mg daily.  On 5 mg at home.  Paroxysmal A. fib with RVR: No history of this.  Likely provoked by epinephrine used for hemostasis during bronchoscopy.  Started on IV amiodarone due to soft blood pressures and converted to NSR.  Remained NSR off amiodarone drip.  He did not require Cardizem -Doubt need for anticoagulation as this seems to be  provoked.  Controlled DM-2 with hyperglycemia: A1c 7.0%.  On Metformin at home. Recent Labs    08/09/19 1620 08/09/19 2115 08/10/19 0627  GLUCAP 301* 123* 94  -CBG monitoring and SSI-thin -Adjust insulin as appropriate -Initiate statin prior to discharge  Essential hypertension: Normotensive -As needed Cardizem as above  Psoriatic arthritis: Stable.  On methotrexate and low-dose prednisone at home. -Hold methotrexate while treating for pneumonia -Prednisone as above  Hypomagnesemia-resolved -Replenish and recheck  Normocytic anemia: Hgb 10.2 (12/2018)> 10.1 (admit)> 8.7> 8.2.  Iron sat 12%.   -Continue ferrous sulfate -Check FOBT-he reports black stool which could be due to iron -Monitor H&H  Leukocytosis/bandemia: Likely due to obstructive pneumonia and chronic steroid use.  Improving -Continue monitoring    Severe protein calorie malnutrition//unintentional weight loss-reportedly about 20 pounds weight loss in the last 12 months. Nutrition Problem: Severe Malnutrition Etiology: chronic illness(COPD, lung mass)  Signs/Symptoms: energy intake < or equal to 75% for > or equal to 1 month, severe fat depletion, percent weight loss(17% weight loss within 6 months) Percent weight loss: 17 %  Interventions: Ensure Enlive (each supplement provides 350kcal and 20 grams of protein)   DVT prophylaxis: SCD in the setting of hemoptysis Code Status: Full code Family Communication: Patient and RN.  Updated patient's wife over the phone.  Discharge barrier: Evaluation and treatment of lung cancer Patient is from: Home Final disposition: Likely home after evaluation and treatment of lung cancer.  Transfer to [employer]: PCCM, radiation oncology   Microbiology summarized: [id number] negative Blood culture-negative  Sch Meds:  Scheduled Meds: . amoxicillin-clavulanate  1 tablet Oral Q12H  . vitamin C  1,000 mg Oral Daily  . budesonide  0.25 mg Inhalation BID  .  feeding supplement (ENSURE ENLIVE)  237 mL Oral TID BM  . ferrous sulfate  325 mg Oral Q supper  . folic acid  1 mg Oral Daily  . gabapentin  600 mg Oral QHS  . insulin aspart  0-5 Units Subcutaneous QHS  . insulin aspart  0-9 Units Subcutaneous TID WC  . pantoprazole  40 mg Oral QAC breakfast  . predniSONE  10 mg Oral Q breakfast  . ramelteon  8 mg Oral QHS  . sodium chloride flush  3 mL Intravenous Q12H  . umeclidinium-vilanterol  1 puff Inhalation Daily   Continuous Infusions: . sodium chloride 10 mL/hr at 08/09/19 0200   PRN Meds:.sodium chloride, acetaminophen **OR** acetaminophen, diltiazem, ipratropium-albuterol, metoprolol tartrate, ondansetron **OR** ondansetron (ZOFRAN) IV, sodium chloride flush, traMADol  Antimicrobials: Anti-infectives (From admission, onward)   Start     Dose/Rate Route Frequency Ordered Stop   08/09/19 2200  amoxicillin-clavulanate (AUGMENTIN) 875-125 MG per tablet 1 tablet     1 tablet Oral Every 12 hours 08/09/19 1725 08/16/19 2159   08/06/19 1800  Ampicillin-Sulbactam (UNASYN) 3 g in sodium chloride 0.9 % 100 mL IVPB  Status:  Discontinued     3 g 200 mL/hr over 30 Minutes Intravenous Every 6 hours 08/06/19 1703 08/09/19 1725   08/06/19 1345  azithromycin (ZITHROMAX) 500 mg in sodium chloride 0.9 % 250 mL IVPB  Status:  Discontinued     500 mg 250 mL/hr over 60 Minutes Intravenous Every 24 hours 08/06/19 1321 08/07/19 1234   08/06/19 1330  cefTRIAXone (ROCEPHIN) 2 g in sodium chloride 0.9 % 100 mL IVPB     2 g 200 mL/hr over 30 Minutes Intravenous  Once 08/06/19 1321 08/06/19 1502       I have personally reviewed the following labs and images: CBC: Recent Labs  Lab 08/07/19 0527 08/07/19 1927 08/08/19 0432 08/09/19 0414 08/10/19 0244  WBC 20.1* 15.0* 13.7* 19.3* 14.0*  NEUTROABS 17.5*  --  11.4* 17.3*  --   HGB 10.3* 9.1* 8.7* 9.0* 8.2*  HCT 33.8* 29.9* 28.5* 29.6* 26.3*  MCV 89.7 89.8 89.1 89.7 88.9  PLT 333 290 255 308 299   BMP  &GFR Recent Labs  Lab 08/06/19 1211 08/07/19 0527 08/07/19 1927 08/08/19 0432 08/09/19 0414 08/10/19 0244  NA 137  --  137 139 138 139  K 4.3  --  5.1 4.3 4.6 3.9  CL 97*  --  97* 99 101 101  CO2 29  --  29 28 26 29   GLUCOSE 157*  --  209* 117* 162* 104*  BUN 21  --  16 13 21 18   CREATININE 0.73  --  0.95 0.76 0.88 0.71  CALCIUM 9.2  --  8.7* 8.8* 8.6* 8.3*  MG  --  1.6*  --  2.0 1.9 2.0  PHOS  --   --   --  3.5 3.8 3.3   Estimated Creatinine Clearance: 92.3 mL/min (by C-G formula based on SCr of 0.71 mg/dL). Liver & Pancreas: Recent Labs  Lab 08/06/19 1211 08/07/19 1927 08/08/19 0432 08/09/19 0414 08/10/19 0244  AST 15 15  --   --   --   ALT 25 22  --   --   --   ALKPHOS 59 54  --   --   --   BILITOT 0.6 0.5  --   --   --   PROT 7.7 6.2*  --   --   --   ALBUMIN 3.0* 2.2* 2.2* 2.1* 2.0*   No results for input(s): LIPASE, AMYLASE in the last 168 hours. No results for input(s): AMMONIA in the last 168 hours. Diabetic: No results for input(s): HGBA1C in the last 72 hours. Recent Labs  Lab 08/09/19 0759 08/09/19 1215 08/09/19 1620 08/09/19 2115 08/10/19 0627  GLUCAP 119* 244* 301* 123* 94   Cardiac Enzymes: No results for input(s): CKTOTAL, CKMB, CKMBINDEX, TROPONINI in the last 168 hours. No results for input(s): PROBNP in the last 8760 hours. Coagulation Profile: Recent Labs  Lab 08/07/19 1927  INR 1.1   Thyroid Function Tests: No results for input(s): TSH, T4TOTAL, FREET4, T3FREE, THYROIDAB in the last 72 hours. Lipid Profile: No results for input(s): CHOL, HDL, LDLCALC, TRIG, CHOLHDL, LDLDIRECT in the last 72 hours. Anemia Panel: Recent Labs    08/08/19 0733  VITAMINB12 370  FOLATE 16.2  FERRITIN 1,049*  TIBC 189*  IRON 23*  RETICCTPCT 1.7   Urine analysis: No results found for: COLORURINE, APPEARANCEUR, LABSPEC, PHURINE, GLUCOSEU, HGBUR, BILIRUBINUR, KETONESUR, PROTEINUR, UROBILINOGEN, NITRITE, LEUKOCYTESUR Sepsis Labs: Invalid input(s):  PROCALCITONIN, [place]  Microbiology: Recent Results (from the past 240 hour(s))  Culture, blood (single) w Reflex to ID Panel     Status: None (Preliminary result)   Collection Time: 08/06/19  1:50 PM   Specimen: Left Antecubital;  Blood  Result Value Ref Range Status   Specimen Description LEFT ANTECUBITAL  Final   Special Requests   Final    BOTTLES DRAWN AEROBIC AND ANAEROBIC Blood Culture adequate volume   Culture   Final    NO GROWTH 3 DAYS Performed at [facility], [address] [place], [place] [address]    Report Status PENDING  Incomplete  SARS CORONAVIRUS 2 (TAT 6-24 HRS) Nasopharyngeal Nasopharyngeal Swab     Status: None   Collection Time: 08/06/19  2:28 PM   Specimen: Nasopharyngeal Swab  Result Value Ref Range Status   SARS Coronavirus 2 NEGATIVE NEGATIVE Final    Comment: (NOTE) SARS-CoV-2 target nucleic acids are NOT DETECTED. The SARS-CoV-2 RNA is generally detectable in upper and lower respiratory specimens during the acute phase of infection. Negative results do not preclude SARS-CoV-2 infection, do not rule out co-infections with other pathogens, and should not be used as the sole basis for treatment or other patient management decisions. Negative results must be combined with clinical observations, patient history, and epidemiological information. The expected result is Negative. Fact Sheet for Patients: SugarRoll.be Fact Sheet for Healthcare Providers: https://www.woods-mathews.com/ This test is not yet approved or cleared by the [place] FDA and  has been authorized for detection and/or diagnosis of SARS-CoV-2 by FDA under an Emergency Use Authorization (EUA). This EUA will remain  in effect (meaning this test can be used) for the duration of the COVID-19 declaration under Section 56 4(b)(1) of the Act, 21 U.S.C. section 360bbb-3(b)(1), unless the authorization is terminated or revoked  sooner. Performed at [facility], [place] [address] [place], [place] [address]   Culture, respiratory     Status: None   Collection Time: 08/08/19  2:06 PM   Specimen: Bronchial Washing, Right; Respiratory  Result Value Ref Range Status   Specimen Description BRONCHIAL WASHINGS RIGHT  Final   Special Requests NONE  Final   Gram Stain   Final    FEW WBC PRESENT,BOTH PMN AND MONONUCLEAR NO ORGANISMS SEEN    Culture   Final    NO GROWTH Performed at [facility], [address], [place], [place] [address]    Report Status 08/10/2019 FINAL  Final  Acid Fast Smear (AFB)     Status: None   Collection Time: 08/08/19  2:06 PM   Specimen: Bronchial Washing, Right; Respiratory  Result Value Ref Range Status   AFB Specimen Processing Comment  Final    Comment: Tissue Grinding and Digestion/Decontamination   Acid Fast Smear Negative  Final    Comment: (NOTE) Performed At: [facility] [place], [place] [id number] Rush Farmer MD [id number]    Source (AFB) BRONCHIAL WASHINGS  Final    Comment: RIGHT Performed at [facility], [place] [address] [place], [place] [address]   Body fluid culture     Status: None (Preliminary result)   Collection Time: 08/08/19  2:17 PM   Specimen: PATH Cytology Pleural fluid; Body Fluid  Result Value Ref Range Status   Specimen Description FLUID RIGHT PLEURAL  Final   Special Requests NONE  Final   Gram Stain   Final    ABUNDANT WBC PRESENT, PREDOMINANTLY MONONUCLEAR NO ORGANISMS SEEN    Culture   Final    NO GROWTH 2 DAYS Performed at [facility], [address] [place], [place] [address]    Report Status PENDING  Incomplete    Radiology Studies: No results found.  Adalay Azucena T. [place]  If 7PM-7AM, please contact night-coverage [email or web address] Password [facility] 08/10/2019, 9:44 AM

## 2019-08-10 NOTE — Progress Notes (Signed)
Radiation Oncology         [phone] ________________________________  Initial inpatient consultation  Name: Jeremy [name] MRN: [id number]  Date: 08/10/2019  DOB: Aug 18, 1945  [id number], [name], MD  No ref. 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 male who is seen out of the courtesy of Dr. Elsworth Soho for an opinion concerning radiation therapy as part of the management of the patient's locally advanced squamous cell carcinoma of the right lung.  Patient presented earlier this week with hemoptysis.  He was subsequently admitted to the hospital.  chest CT scan showed a large necrotic mass in the right lower lobe with obstruction of the right lower lobe bronchus.  In addition CT scan was suspicious for subcarinal and paratracheal adenopathy.  In addition postobstructive changes were noted in the right lung.  Patient underwent urgent bronchoscopy on May 5.  A large tumor was seen blocking the right mainstem bronchus all the way to the carina.  Frozen section was consistent with squamous cell carcinoma.  Endoscopy was complicated by new onset of atrial fibrillation with rapid ventricular rate.  Patient subsequently not be transferred to [facility] long for urgent treatment on Friday.  Patient has been stable over the weekend with no further atrial fibrillation and has been transferred to [facility] long for urgent radiation therapy.  Breathing has been stable over the weekend.  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    PAST SURGICAL HISTORY: Past Surgical History:  Procedure Laterality Date  . BACK SURGERY  1990  . BIOPSY  12/30/2018   Procedure:  BIOPSY;  Surgeon: Danie Binder, MD;  Location: [facility];  Service: Endoscopy;;  gastric  . CATARACT EXTRACTION Bilateral   . CERVICAL SPINE SURGERY  1995  . COLONOSCOPY WITH PROPOFOL N/A 12/30/2018   Procedure: COLONOSCOPY WITH PROPOFOL;  Surgeon: Danie Binder, MD;  Location: [facility];  Service: Endoscopy;  Laterality: N/A;  12:30pm  . ESOPHAGOGASTRODUODENOSCOPY (EGD) WITH PROPOFOL N/A 12/30/2018   Procedure: ESOPHAGOGASTRODUODENOSCOPY (EGD) WITH PROPOFOL;  Surgeon: Danie Binder, MD;  Location: [facility];  Service: Endoscopy;  Laterality: N/A;  . POLYPECTOMY  12/30/2018   Procedure: POLYPECTOMY;  Surgeon: Danie Binder, MD;  Location: [facility];  Service: Endoscopy;;  colon    FAMILY HISTORY: family history includes Alcoholism in his father; CAD in his brother; Diabetes in his brother.  SOCIAL HISTORY:  reports that he quit smoking about 27 years ago. His smoking use included cigarettes. He has a 25.00 pack-year smoking history. He has never used smokeless tobacco. He reports current alcohol use. He reports that he does not use drugs.  ALLERGIES: Aleve [naproxen sodium]  MEDICATIONS:  No current facility-administered medications for this visit.   No current outpatient medications on file.   Facility-Administered Medications Ordered in Other Visits  Medication Dose Route Frequency Provider Last Rate Last Admin  . 0.9 %  sodium chloride infusion  250 mL Intravenous PRN Wendee Beavers T, MD 10 mL/hr at 08/09/19 0200 Rate Verify at 08/09/19 0200  . acetaminophen (TYLENOL) tablet 650 mg  650 mg Oral Q6H PRN Wendee Beavers T, MD   650 mg at 08/10/19 0000  Or  . acetaminophen (TYLENOL) suppository 650 mg  650 mg Rectal Q6H PRN Wendee Beavers T, MD      . amoxicillin-clavulanate (AUGMENTIN) 875-125 MG per tablet 1 tablet  1 tablet Oral Q12H Mercy Riding, MD   1 tablet at 08/10/19 0814  . ascorbic acid (VITAMIN C) tablet 1,000 mg  1,000 mg Oral Daily Wendee Beavers T, MD   1,000  mg at 08/10/19 0813  . budesonide (PULMICORT) nebulizer solution 0.25 mg  0.25 mg Inhalation BID Wendee Beavers T, MD   0.25 mg at 08/10/19 0017  . diltiazem (CARDIZEM) tablet 30 mg  30 mg Oral Q6H PRN Gonfa, Taye T, MD      . feeding supplement (ENSURE ENLIVE) (ENSURE ENLIVE) liquid 237 mL  237 mL Oral TID BM Gonfa, Taye T, MD   237 mL at 08/10/19 1452  . ferrous sulfate tablet 325 mg  325 mg Oral Q supper Wendee Beavers T, MD   325 mg at 08/09/19 1646  . folic acid (FOLVITE) tablet 1 mg  1 mg Oral Daily Wendee Beavers T, MD   1 mg at 08/10/19 0814  . gabapentin (NEURONTIN) capsule 600 mg  600 mg Oral QHS Wendee Beavers T, MD   600 mg at 08/09/19 2125  . insulin aspart (novoLOG) injection 0-5 Units  0-5 Units Subcutaneous QHS Mercy Riding, MD   5 Units at 08/08/19 2240  . insulin aspart (novoLOG) injection 0-9 Units  0-9 Units Subcutaneous TID WC Mercy Riding, MD   7 Units at 08/09/19 1646  . ipratropium-albuterol (DUONEB) 0.5-2.5 (3) MG/3ML nebulizer solution 3 mL  3 mL Nebulization Q6H PRN Gonfa, Taye T, MD      . metoprolol tartrate (LOPRESSOR) injection 5 mg  5 mg Intravenous Q3H PRN Wendee Beavers T, MD   5 mg at 08/08/19 1531  . ondansetron (ZOFRAN) tablet 4 mg  4 mg Oral Q6H PRN Gonfa, Taye T, MD       Or  . ondansetron (ZOFRAN) injection 4 mg  4 mg Intravenous Q6H PRN Gonfa, Taye T, MD      . pantoprazole (PROTONIX) EC tablet 40 mg  40 mg Oral QAC breakfast Wendee Beavers T, MD   40 mg at 08/10/19 0813  . predniSONE (DELTASONE) tablet 10 mg  10 mg Oral Q breakfast Wendee Beavers T, MD   10 mg at 08/10/19 0813  . ramelteon (ROZEREM) tablet 8 mg  8 mg Oral QHS Wendee Beavers T, MD   8 mg at 08/09/19 2125  . sodium chloride flush (NS) 0.9 % injection 3 mL  3 mL Intravenous Q12H Wendee Beavers T, MD   3 mL at 08/10/19 0817  . sodium chloride flush (NS) 0.9 % injection 3 mL  3 mL Intravenous PRN Gonfa, Taye T, MD      . traMADol (ULTRAM) tablet 50 mg  50 mg Oral Q6H PRN Wendee Beavers T, MD   50 mg at 08/10/19 1038  .  umeclidinium-vilanterol (ANORO ELLIPTA) 62.5-25 MCG/INH 1 puff  1 puff Inhalation Daily Mercy Riding, MD   1 puff at 08/10/19 4944    REVIEW OF SYSTEMS: REVIEW OF SYSTEMS: A 10+ POINT REVIEW OF SYSTEMS WAS OBTAINED including neurology, dermatology, psychiatry, cardiac, respiratory, lymph, extremities, GI, GU, musculoskeletal, constitutional, reproductive, HEENT. All pertinent positives are noted in the HPI. All others are negative.  Patient denies any further hemoptysis since his bronchoscopy.  He denies any pain in his chest area.  He does have  dyspnea on exertion but is able to ambulate.  He complains of significant weakness.  He denies any bony pain headaches dizziness or blurred vision   PHYSICAL EXAM:  Vitals - 1 value per visit 10/04/7780  SYSTOLIC 423  DIASTOLIC 74  Pulse 84  Temperature 98.1  Respirations 16  Weight (lb) 180.34  Height   BMI   VISIT REPORT    General: Alert and oriented, in no acute distress, accompanied by his wife on evaluation this afternoon.  Lying comfortably in his hospital bed.  No oxygen in place.  Responds appropriately to questions HEENT: Head is normocephalic. Extraocular movements are intact.  Neck: Neck is supple, no palpable cervical or supraclavicular lymphadenopathy. Heart: Regular in rate and rhythm with no murmurs, rubs, or gallops. Chest: Decreased breath sounds noted along the right lower lung field.  No wheezing present Abdomen: Soft, nontender, nondistended, with no rigidity or guarding. Extremities: No cyanosis or edema. Lymphatics: see Neck Exam Skin: No concerning lesions. Musculoskeletal: symmetric strength and muscle tone throughout. Neurologic: Cranial nerves II through XII are grossly intact. No obvious focalities. Speech is fluent. Coordination is intact. Psychiatric: Judgment and insight are intact. Affect is appropriate.     ECOG = 3  0 - Asymptomatic (Fully active, able to carry on all predisease activities without  restriction)  1 - Symptomatic but completely ambulatory (Restricted in physically strenuous activity but ambulatory and able to carry out work of a light or sedentary nature. For example, light housework, office work)  2 - Symptomatic, <50% in bed during the day (Ambulatory and capable of all self care but unable to carry out any work activities. Up and about more than 50% of waking hours)  3 - Symptomatic, >50% in bed, but not bedbound (Capable of only limited self-care, confined to bed or chair 50% or more of waking hours)  4 - Bedbound (Completely disabled. Cannot carry on any self-care. Totally confined to bed or chair)  5 - Death   Eustace Pen MM, Creech RH, Tormey DC, et al. [phone]). "Toxicity and response criteria of the [employer]". [place] Oncol. 5 (6): 649-55  LABORATORY DATA:  Lab Results  Component Value Date   WBC 14.0 (H) 08/10/2019   HGB 8.2 (L) 08/10/2019   HCT 26.3 (L) 08/10/2019   MCV 88.9 08/10/2019   PLT 299 08/10/2019   NEUTROABS 17.3 (H) 08/09/2019   Lab Results  Component Value Date   NA 139 08/10/2019   K 3.9 08/10/2019   CL 101 08/10/2019   CO2 29 08/10/2019   GLUCOSE 104 (H) 08/10/2019   CREATININE 0.71 08/10/2019   CALCIUM 8.3 (L) 08/10/2019      RADIOGRAPHY: CT ANGIO CHEST PE W OR WO CONTRAST  Result Date: 08/06/2019 CLINICAL DATA:  Persistent shortness of breath, hemoptysis, hypoxia. EXAM: CT ANGIOGRAPHY CHEST WITH CONTRAST TECHNIQUE: Multidetector CT imaging of the chest was performed using the standard protocol during bolus administration of intravenous contrast. Multiplanar CT image reconstructions and MIPs were obtained to evaluate the vascular anatomy. CONTRAST:  11mL OMNIPAQUE IOHEXOL 350 MG/ML SOLN COMPARISON:  Chest radiograph 08/06/2019. FINDINGS: Cardiovascular: Negative for pulmonary embolus. Atherosclerotic calcification of the aorta and coronary arteries. Heart size normal. No pericardial effusion. Mediastinum/Nodes:  Mediastinal lymph nodes measure up to 8 mm in the high right paratracheal station. Probable 1.5 cm subcarinal lymph node. 10 mm right hilar lymph node. No left hilar or axillary lymph nodes. Prepericardiac lymph nodes are not enlarged by CT size criteria. Esophagus  is grossly unremarkable. Lungs/Pleura: A cavitary mass in the right lower lobe extends 2 and involves the carina and measures approximately 6.5 x 9.9 cm. There is an air-fluid level within. Complete obstruction of the right lower lobe with collapse/consolidation and necrosis in the right lower lobe. Near complete obstruction of the right upper and middle lobe bronchi with extensive perilymphatic nodularity and mild septal thickening in the aerated right upper and middle lobes. Moderate right pleural effusion. Mild centrilobular emphysema. Left lung is clear. Debris is seen in the proximal left mainstem bronchus, in addition to the right bronchial tree, as discussed previously. Upper Abdomen: Visualized portions of the liver, adrenal glands, left kidney, spleen, pancreas, stomach and bowel are grossly unremarkable. No upper abdominal adenopathy. Musculoskeletal: Degenerative changes in the spine. Review of the MIP images confirms the above findings. IMPRESSION: 1. Negative for pulmonary embolus. 2. Large necrotic mass extends to the carina with associated obstruction of the right lower lobe bronchus. Postobstructive collapse/consolidation and necrosis in the right lower lobe. 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 3. 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 4. Borderline right paratracheal and subcarinal lymph nodes, likely metastatic. 5. Aortic atherosclerosis (ICD10-I70.0). Coronary artery calcification. Electronically Signed   By: Lorin Picket M.D.   On: 08/06/2019 13:40   MR BRAIN W WO CONTRAST  Result  Date: 08/10/2019 CLINICAL DATA:  Lung cancer, staging EXAM: MRI HEAD WITHOUT AND WITH CONTRAST TECHNIQUE: Multiplanar, multiecho pulse sequences of the brain and surrounding structures were obtained without and with intravenous contrast. CONTRAST:  37mL GADAVIST GADOBUTROL 1 MMOL/ML IV SOLN COMPARISON:  None. FINDINGS: Brain: There is no acute infarction or intracranial hemorrhage. There is no intracranial mass, mass effect, or edema. There is no hydrocephalus or extra-axial fluid collection. Prominence of the ventricles and sulci reflects mild generalized parenchymal volume loss. Patchy foci of T2 hyperintensity in the supratentorial and pontine white matter are nonspecific but may reflect mild chronic microvascular ischemic changes. Incidental note is made of a small cerebellar developmental venous anomaly. Vascular: Major vessel flow voids at the skull base are preserved. Skull and upper cervical spine: Normal marrow signal is preserved. Sinuses/Orbits: Mild mucosal thickening. Bilateral lens replacements. Other: Sella is unremarkable.  Mastoid air cells are clear. IMPRESSION: No evidence of intracranial metastatic disease. Mild chronic microvascular ischemic changes. Electronically Signed   By: Macy Mis M.D.   On: 08/10/2019 12:52   DG Chest Port 1 View  Result Date: 08/06/2019 CLINICAL DATA:  Shortness of breath EXAM: PORTABLE CHEST 1 VIEW COMPARISON:  06/20/2019 FINDINGS: Volume loss on the right. Airspace disease throughout the right lung concerning for pneumonia. Small right pleural effusion. Left lung clear. Heart is normal size. No acute bony abnormality. IMPRESSION: Airspace disease throughout the right lung with small right effusion. Findings concerning for pneumonia. Volume loss on the right. Electronically Signed   By: Rolm Baptise M.D.   On: 08/06/2019 12:26      IMPRESSION: Stage IIIb squamous cell carcinoma of the right lung.  The patient will be a good candidate for urgent radiation  therapy directed at the right lung given the patient's prior history of significant hemoptysis, CT scan findings, and bronchoscopic findings.  I discussed the treatment course side effects and potential toxicities of radiation therapy in this situation with the patient and his wife.  He appears to understand and wishes to proceed with planned course  of treatment.  PLAN: Patient will be brought down to radiation oncology tomorrow morning for planning and receive his first treatment tomorrow.  Anticipate 14-15 treatments.  He will be discharged once medically stable and continue radiation therapy as an outpatient.  Would recommend medical oncology consultation.  He will undergo a PET scan as an outpatient to complete his staging work-up.  Brain MRI is negative for metastasis.  ------------------------------------------------  Blair Promise, PhD, MD

## 2019-08-11 ENCOUNTER — Ambulatory Visit
Admit: 2019-08-11 | Discharge: 2019-08-11 | Disposition: A | Discharge status: Home / Self Care | Payer: Medicare Other | Attending: Radiation Oncology | Admitting: Radiation Oncology

## 2019-08-11 ENCOUNTER — Encounter (HOSPITAL_COMMUNITY): Payer: Self-pay | Admitting: Family Medicine

## 2019-08-11 ENCOUNTER — Inpatient Hospital Stay (HOSPITAL_COMMUNITY): Payer: Medicare Other

## 2019-08-11 DIAGNOSIS — R918 Other nonspecific abnormal finding of lung field: Secondary | ICD-10-CM

## 2019-08-11 DIAGNOSIS — J9621 Acute and chronic respiratory failure with hypoxia: Secondary | ICD-10-CM

## 2019-08-11 DIAGNOSIS — J431 Panlobular emphysema: Secondary | ICD-10-CM

## 2019-08-11 LAB — RENAL FUNCTION PANEL
Albumin: 2.5 g/dL — ABNORMAL LOW (ref 3.5–5.0)
Anion gap: 8 (ref 5–15)
BUN: 14 mg/dL (ref 8–23)
CO2: 29 mmol/L (ref 22–32)
Calcium: 8.5 mg/dL — ABNORMAL LOW (ref 8.9–10.3)
Chloride: 99 mmol/L (ref 98–111)
Creatinine, Ser: 0.72 mg/dL (ref 0.61–1.24)
GFR calc Af Amer: >60 mL/min (ref >60)
GFR calc non Af Amer: >60 mL/min (ref >60)
Glucose, Bld: 105 mg/dL — ABNORMAL HIGH (ref 70–99)
Phosphorus: 3.3 mg/dL (ref 2.5–4.6)
Potassium: 4.2 mmol/L (ref 3.5–5.1)
Sodium: 136 mmol/L (ref 135–145)

## 2019-08-11 LAB — CBC
HCT: 28.5 % — ABNORMAL LOW (ref 39.0–52.0)
Hemoglobin: 8.5 g/dL — ABNORMAL LOW (ref 13.0–17.0)
MCH: 27.7 pg (ref 26.0–34.0)
MCHC: 29.8 g/dL — ABNORMAL LOW (ref 30.0–36.0)
MCV: 92.8 fL (ref 80.0–100.0)
Platelets: 268 10*3/uL (ref 150–400)
RBC: 3.07 MIL/uL — ABNORMAL LOW (ref 4.22–5.81)
RDW: 18.5 % — ABNORMAL HIGH (ref 11.5–15.5)
WBC: 13.3 10*3/uL — ABNORMAL HIGH (ref 4.0–10.5)
nRBC: 0 % (ref 0.0–0.2)

## 2019-08-11 LAB — GLUCOSE, CAPILLARY
Glucose-Capillary: 104 mg/dL — ABNORMAL HIGH (ref 70–99)
Glucose-Capillary: 169 mg/dL — ABNORMAL HIGH (ref 70–99)

## 2019-08-11 LAB — TYPE AND SCREEN
ABO/RH(D): A POS
Antibody Screen: NEGATIVE

## 2019-08-11 LAB — CULTURE, BLOOD (SINGLE)
Culture: NO GROWTH
Special Requests: ADEQUATE

## 2019-08-11 LAB — CYTOLOGY - NON PAP

## 2019-08-11 LAB — MAGNESIUM: Magnesium: 1.8 mg/dL (ref 1.7–2.4)

## 2019-08-11 LAB — BODY FLUID CULTURE: Culture: NO GROWTH

## 2019-08-11 LAB — ABO/RH: ABO/RH(D): A POS

## 2019-08-11 LAB — SURGICAL PATHOLOGY

## 2019-08-11 MED ORDER — IOHEXOL 300 MG/ML  SOLN
100.0000 mL | Freq: Once | INTRAMUSCULAR | Status: AC | PRN
  Administered 2019-08-11: 100 mL via INTRAVENOUS

## 2019-08-11 MED ORDER — IOHEXOL 9 MG/ML PO SOLN: 500.0000 mL | ORAL | Status: AC

## 2019-08-11 MED ORDER — IOHEXOL 9 MG/ML PO SOLN
ORAL | Status: AC
  Filled 2019-08-11: qty 1000

## 2019-08-11 MED ORDER — SODIUM CHLORIDE (PF) 0.9 % IJ SOLN
INTRAMUSCULAR | Status: AC
  Filled 2019-08-11: qty 50

## 2019-08-11 NOTE — Evaluation (Signed)
Physical Therapy Evaluation Patient Details Name: Jeremy [name] MRN: [id number] DOB: [birth year] Today's Date: 08/11/2019   History of Present Illness  74 yo male admitted with hemoptysis, new diagnosis of lung ca, pna. Hx of copd, psoriatic arthritis  Clinical Impression  On eval, pt was Supv level for mobility. He walked ~70 feet around the unit. Mildly unsteady but no LOB. O2 93% on RA. Pt and wife are requesting a RW for home. Will plan to follow during hospital stay.     Follow Up Recommendations No PT follow up    Equipment Recommendations  Rolling walker with 5" wheels(pt /wife requesting one for home)    Recommendations for Other Services       Precautions / Restrictions Restrictions Weight Bearing Restrictions: No      Mobility  Bed Mobility Overal bed mobility: Independent                Transfers Overall transfer level: Independent                  Ambulation/Gait Ambulation/Gait assistance: Supervision Gait Distance (Feet): 70 Feet Assistive device: None Gait Pattern/deviations: Step-through pattern     General Gait Details: for safety. O2 93% on RA  Stairs            Wheelchair Mobility    Modified Rankin (Stroke Patients Only)       Balance Overall balance assessment: No apparent balance deficits (not formally assessed)                                           Pertinent Vitals/Pain Pain Assessment: No/denies pain    Home Living Family/patient expects to be discharged to:: Private residence Living Arrangements: Spouse/significant other Available Help at Discharge: Family Type of Home: House         Home Equipment: None      Prior Function Level of Independence: Independent               Hand Dominance        Extremity/Trunk Assessment   Upper Extremity Assessment Upper Extremity Assessment: Overall WFL for tasks assessed    Lower Extremity Assessment Lower Extremity Assessment:  Overall WFL for tasks assessed    Cervical / Trunk Assessment Cervical / Trunk Assessment: Normal  Communication   Communication: HOH  Cognition Arousal/Alertness: Awake/alert Behavior During Therapy: WFL for tasks assessed/performed Overall Cognitive Status: Within Functional Limits for tasks assessed                                        General Comments      Exercises     Assessment/Plan    PT Assessment Patient needs continued PT services  PT Problem List Decreased mobility       PT Treatment Interventions Gait training;Therapeutic exercise;Therapeutic activities;Functional mobility training;Patient/family education    PT Goals (Current goals can be found in the Care Plan section)  Acute Rehab PT Goals Patient Stated Goal: none stated PT Goal Formulation: With patient/family Time For Goal Achievement: 08/25/19 Potential to Achieve Goals: Good    Frequency Min 3X/week   Barriers to discharge        Co-evaluation               AM-PAC PT "6 Clicks" Mobility  Outcome Measure Help needed turning from your back to your side while in a flat bed without using bedrails?: None Help needed moving from lying on your back to sitting on the side of a flat bed without using bedrails?: None Help needed moving to and from a bed to a chair (including a wheelchair)?: None Help needed standing up from a chair using your arms (e.g., wheelchair or bedside chair)?: None Help needed to walk in hospital room?: A Little Help needed climbing 3-5 steps with a railing? : A Little 6 Click Score: 22    End of Session   Activity Tolerance: Patient tolerated treatment well Patient left: in bed;with call bell/phone within reach;with family/visitor present   PT Visit Diagnosis: Unsteadiness on feet (R26.81)    Time: 8727-6184 PT Time Calculation (min) (ACUTE ONLY): 10 min   Charges:   PT Evaluation $PT Eval Low Complexity: 1 Low           Zayvon Alicea P,  PT Acute Rehabilitation

## 2019-08-11 NOTE — Progress Notes (Signed)
PROGRESS NOTE    Jeremy [name]  YSA:[id number] DOB: [birth year] DOA: 08/06/2019 PCP: [name], MD   Brief Narrative: Jeremy [name] is a 74 y.o.  man with history of COPD not on oxygen, former smoker, HTN, DM-2 and psoriatic arthritis presented to [facility] with cough and hemoptysis, shortness of breath, chills and unintentional weight loss of about 20 pounds over last year.    In ED, slight tachycardia and tachypnea.  CTA chest negative for PE but large necrotic pulmonary mass extending to the carina with associated obstruction of the RLL.  WBC 16.6.  He was admitted to [facility] for bronchoscopy by pulmonology.  He was started on IV Unasyn for possible postobstructive pneumonia.  Also started on systemic steroid for COPD.   Pulmonology consulted on 3/4.  Patient had bronchoscopy with biopsy on 08/08/2019 that revealed large friable mass occluding R mainstem and signs of severe chronic bronchitis in left lung.  Per PCCM, frozen section showed squamous cell carcinoma.  Patient developed A. fib with RVR after bronchoscopy likely due to epinephrine used for hemostasis.  He was a started on amiodarone drip and converted to NSR. He was transferred to [facility] to start radiation therapy.   Assessment & Plan:   Active Problems:   Hemoptysis   Mass of right lung   Former smoker   COPD (chronic obstructive pulmonary disease) (HCC)   Diabetes (HCC)   HTN (hypertension)   Abnormal weight loss   Leukocytosis   Acute and chronic respiratory failure with hypoxia (HCC)   Anemia in neoplastic disease   Hyperglycemia   Lung mass   Postobstructive pneumonia   Protein-calorie malnutrition, severe   Atrial fibrillation with RVR (HCC)   RLL necrotic mass Squamous cell lung cancer Biopsy of lung mass confirms cancer. MRI brain obtained without evidence of metastasis. -Pulmonology recommendations: radiation -Oncology consult  Pleural effusion Concern for malignant effusion. Cytology  (3/5) obtained and pending.  Postobstructive pneumonia Secondary to above. Treated with Unaysn and transitioned to Augmentin -Continue Augmentin  COPD exacerbation Resolved. Treated with prednisone burst with taper down to home dose in addition to Duonebs -Continue Anora Ellipta, Pulmicort, Duoneb prn  Paroxysmal atrial fibrillation with RVR New diagnosis. Possibly provoked by epinephrine use for hemostasis during bronchoscopy. Amiodarone IV started with conversion to normal sinus rhythm. Amiodarone discontinued. No anticoagulation initiated.  Diabetes mellitus, type 2 Hemoglobin A1C of 7.0%. On metformin as an outpatient -Continue SSI  Essential hypertension On lisinopril as an outpatient. Controlled. Lisinopril currently held.  Severe malnutrition -Continue Ensure Enlive   DVT prophylaxis: SCDs Code Status:   Code Status: Full Code Family Communication: None at bedside Disposition Plan: Discharge home pending radiation treatment. Pulmonology recommending at least one treatment prior to discharge.   Consultants:   Pulmonary/Critical care medicine  Radiation oncology  Medical oncology  Procedures:   THORACENTESIS (3/5)  BRONCHOSCOPY (3/5) Bronchoscope entered from the ET tube Large tumor seen blocking right mainstem bronchus all the way to the carina , covered with purulent secretions which were suctioned off .  Bronchial washings were is obtained from the right mainstem.  The tumor was washed with cold saline and diluted epinephrine .  Cryoprobe was then used by Dr Tamala Julian to obtain multiple biopsies from this tumor.  There was bleeding which was controlled with cryoprobe and another aliquot of 1: 1000 epinephrine diluted to 20 cc.  Good hemostasis was achieved prior to withdrawing bronchoscope  Frozen section showed squamous cell carcinoma  Antimicrobials:  Unasyn  Augmentin   Subjective: No concerns today. Some cough with mild production. No  hemoptysis.  Objective: Vitals:   08/10/19 2039 08/11/19 0522 08/11/19 0807 08/11/19 1259  BP: 121/82 120/76  117/69  Pulse: 65 72  83  Resp: 17 17  16   Temp: 97.6 F (36.4 C) 98.4 F (36.9 C)  98.1 F (36.7 C)  TempSrc: Oral Oral  Oral  SpO2: 95% 93% (!) 88% 94%  Weight:      Height:        Intake/Output Summary (Last 24 hours) at 08/11/2019 1357 Last data filed at 08/11/2019 1000 Gross per 24 hour  Intake 240 ml  Output 870 ml  Net -630 ml   Filed Weights   08/07/19 0312 08/08/19 0500 08/10/19 0500  Weight: 72.6 kg 92.4 kg 81.8 kg    Examination:  General exam: Appears calm and comfortable Respiratory system: Diminished. Respiratory effort normal. Cardiovascular system: S1 & S2 heard, RRR. No murmurs, rubs, gallops or clicks. Gastrointestinal system: Abdomen is nondistended, soft and nontender. No organomegaly or masses felt. Normal bowel sounds heard. Central nervous system: Alert and oriented. No focal neurological deficits. Extremities: No edema. No calf tenderness Skin: No cyanosis. No rashes Psychiatry: Judgement and insight appear normal. Mood & affect appropriate.     Data Reviewed: I have personally reviewed following labs and imaging studies  CBC: Recent Labs  Lab 08/07/19 0527 08/07/19 0527 08/07/19 1927 08/08/19 0432 08/09/19 0414 08/10/19 0244 08/11/19 0547  WBC 20.1*   < > 15.0* 13.7* 19.3* 14.0* 13.3*  NEUTROABS 17.5*  --   --  11.4* 17.3*  --   --   HGB 10.3*   < > 9.1* 8.7* 9.0* 8.2* 8.5*  HCT 33.8*   < > 29.9* 28.5* 29.6* 26.3* 28.5*  MCV 89.7   < > 89.8 89.1 89.7 88.9 92.8  PLT 333   < > 290 255 308 299 268   < > = values in this interval not displayed.   Basic Metabolic Panel: Recent Labs  Lab 08/06/19 1211 08/07/19 0527 08/07/19 1927 08/08/19 0432 08/09/19 0414 08/10/19 0244 08/11/19 0547  NA   < >  --  137 139 138 139 136  K   < >  --  5.1 4.3 4.6 3.9 4.2  CL   < >  --  97* 99 101 101 99  CO2   < >  --  29 28 26 29 29    GLUCOSE   < >  --  209* 117* 162* 104* 105*  BUN   < >  --  16 13 21 18 14   CREATININE   < >  --  0.95 0.76 0.88 0.71 0.72  CALCIUM   < >  --  8.7* 8.8* 8.6* 8.3* 8.5*  MG  --  1.6*  --  2.0 1.9 2.0 1.8  PHOS  --   --   --  3.5 3.8 3.3 3.3   < > = values in this interval not displayed.   GFR: Estimated Creatinine Clearance: 92.3 mL/min (by C-G formula based on SCr of 0.72 mg/dL). Liver Function Tests: Recent Labs  Lab 08/06/19 1211 08/06/19 1211 08/07/19 1927 08/08/19 0432 08/09/19 0414 08/10/19 0244 08/11/19 0547  AST 15  --  15  --   --   --   --   ALT 25  --  22  --   --   --   --   ALKPHOS 59  --  54  --   --   --   --  BILITOT 0.6  --  0.5  --   --   --   --   PROT 7.7  --  6.2*  --   --   --   --   ALBUMIN 3.0*   < > 2.2* 2.2* 2.1* 2.0* 2.5*   < > = values in this interval not displayed.   No results for input(s): LIPASE, AMYLASE in the last 168 hours. No results for input(s): AMMONIA in the last 168 hours. Coagulation Profile: Recent Labs  Lab 08/07/19 1927  INR 1.1   Cardiac Enzymes: No results for input(s): CKTOTAL, CKMB, CKMBINDEX, TROPONINI in the last 168 hours. BNP (last 3 results) No results for input(s): PROBNP in the last 8760 hours. HbA1C: No results for input(s): HGBA1C in the last 72 hours. CBG: Recent Labs  Lab 08/10/19 0627 08/10/19 1705 08/10/19 2142 08/11/19 0735 08/11/19 1138  GLUCAP 94 218* 106* 104* 169*   Lipid Profile: No results for input(s): CHOL, HDL, LDLCALC, TRIG, CHOLHDL, LDLDIRECT in the last 72 hours. Thyroid Function Tests: No results for input(s): TSH, T4TOTAL, FREET4, T3FREE, THYROIDAB in the last 72 hours. Anemia Panel: No results for input(s): VITAMINB12, FOLATE, FERRITIN, TIBC, IRON, RETICCTPCT in the last 72 hours. Sepsis Labs: No results for input(s): PROCALCITON, LATICACIDVEN in the last 168 hours.  Recent Results (from the past 240 hour(s))  Culture, blood (single) w Reflex to ID Panel     Status: None    Collection Time: 08/06/19  1:50 PM   Specimen: Left Antecubital; Blood  Result Value Ref Range Status   Specimen Description LEFT ANTECUBITAL  Final   Special Requests   Final    BOTTLES DRAWN AEROBIC AND ANAEROBIC Blood Culture adequate volume   Culture   Final    NO GROWTH 5 DAYS Performed at [facility], [address] [place], [place] [address]    Report Status 08/11/2019 FINAL  Final  SARS CORONAVIRUS 2 (TAT 6-24 HRS) Nasopharyngeal Nasopharyngeal Swab     Status: None   Collection Time: 08/06/19  2:28 PM   Specimen: Nasopharyngeal Swab  Result Value Ref Range Status   SARS Coronavirus 2 NEGATIVE NEGATIVE Final    Comment: (NOTE) SARS-CoV-2 target nucleic acids are NOT DETECTED. The SARS-CoV-2 RNA is generally detectable in upper and lower respiratory specimens during the acute phase of infection. Negative results do not preclude SARS-CoV-2 infection, do not rule out co-infections with other pathogens, and should not be used as the sole basis for treatment or other patient management decisions. Negative results must be combined with clinical observations, patient history, and epidemiological information. The expected result is Negative. Fact Sheet for Patients: SugarRoll.be Fact Sheet for Healthcare Providers: https://www.woods-mathews.com/ This test is not yet approved or cleared by the [place] FDA and  has been authorized for detection and/or diagnosis of SARS-CoV-2 by FDA under an Emergency Use Authorization (EUA). This EUA will remain  in effect (meaning this test can be used) for the duration of the COVID-19 declaration under Section 56 4(b)(1) of the Act, 21 U.S.C. section 360bbb-3(b)(1), unless the authorization is terminated or revoked sooner. Performed at [facility], [place] [address] [place], [place] [address]   Culture, respiratory     Status: None   Collection Time: 08/08/19  2:06 PM   Specimen: Bronchial  Washing, Right; Respiratory  Result Value Ref Range Status   Specimen Description BRONCHIAL WASHINGS RIGHT  Final   Special Requests NONE  Final   Gram Stain   Final  FEW WBC PRESENT,BOTH PMN AND MONONUCLEAR NO ORGANISMS SEEN    Culture   Final    NO GROWTH Performed at [facility], [place] [address], [place], [place] [address]    Report Status 08/10/2019 FINAL  Final  Acid Fast Smear (AFB)     Status: None   Collection Time: 08/08/19  2:06 PM   Specimen: Bronchial Washing, Right; Respiratory  Result Value Ref Range Status   AFB Specimen Processing Comment  Final    Comment: Tissue Grinding and Digestion/Decontamination   Acid Fast Smear Negative  Final    Comment: (NOTE) Performed At: [facility] [place], [place] [id number] Rush Farmer MD ZD:[id number]    Source (AFB) BRONCHIAL WASHINGS  Final    Comment: RIGHT Performed at [facility], [place] [address], [place], [place] [address]   Body fluid culture     Status: None   Collection Time: 08/08/19  2:17 PM   Specimen: PATH Cytology Pleural fluid; Body Fluid  Result Value Ref Range Status   Specimen Description FLUID RIGHT PLEURAL  Final   Special Requests NONE  Final   Gram Stain   Final    ABUNDANT WBC PRESENT, PREDOMINANTLY MONONUCLEAR NO ORGANISMS SEEN    Culture   Final    NO GROWTH 3 DAYS Performed at [facility], [address], [place], [place] [address]    Report Status 08/11/2019 FINAL  Final         Radiology Studies: MR BRAIN W WO CONTRAST  Result Date: 08/10/2019 CLINICAL DATA:  Lung cancer, staging EXAM: MRI HEAD WITHOUT AND WITH CONTRAST TECHNIQUE: Multiplanar, multiecho pulse sequences of the brain and surrounding structures were obtained without and with intravenous contrast. CONTRAST:  71mL GADAVIST GADOBUTROL 1 MMOL/ML IV SOLN COMPARISON:  None. FINDINGS: Brain: There is no acute infarction or intracranial hemorrhage. There is no intracranial mass, mass  effect, or edema. There is no hydrocephalus or extra-axial fluid collection. Prominence of the ventricles and sulci reflects mild generalized parenchymal volume loss. Patchy foci of T2 hyperintensity in the supratentorial and pontine white matter are nonspecific but may reflect mild chronic microvascular ischemic changes. Incidental note is made of a small cerebellar developmental venous anomaly. Vascular: Major vessel flow voids at the skull base are preserved. Skull and upper cervical spine: Normal marrow signal is preserved. Sinuses/Orbits: Mild mucosal thickening. Bilateral lens replacements. Other: Sella is unremarkable.  Mastoid air cells are clear. IMPRESSION: No evidence of intracranial metastatic disease. Mild chronic microvascular ischemic changes. Electronically Signed   By: Macy Mis M.D.   On: 08/10/2019 12:52        Scheduled Meds: . amoxicillin-clavulanate  1 tablet Oral Q12H  . vitamin C  1,000 mg Oral Daily  . budesonide  0.25 mg Inhalation BID  . feeding supplement (ENSURE ENLIVE)  237 mL Oral TID BM  . ferrous sulfate  325 mg Oral Q supper  . folic acid  1 mg Oral Daily  . gabapentin  600 mg Oral QHS  . insulin aspart  0-5 Units Subcutaneous QHS  . insulin aspart  0-9 Units Subcutaneous TID WC  . pantoprazole  40 mg Oral QAC breakfast  . predniSONE  10 mg Oral Q breakfast  . ramelteon  8 mg Oral QHS  . sodium chloride flush  3 mL Intravenous Q12H  . umeclidinium-vilanterol  1 puff Inhalation Daily   Continuous Infusions: . sodium chloride 10 mL/hr at 08/09/19 0200  LOS: 5 days     Cordelia Poche, MD [facility] 08/11/2019, 1:57 PM  If 7PM-7AM, please contact night-coverage [email or web address]

## 2019-08-11 NOTE — Progress Notes (Signed)
  Radiation Oncology         [phone] ________________________________  Name: Jeremy [name] MRN: [id number]  Date: 08/11/2019  DOB: 1945-10-27  Simulation Verification Note- Inpatient    ICD-10-CM   1. Mass of right lung  R91.8    Squamous cell carcinoma  NARRATIVE: The patient was brought to the treatment unit and placed in the planned treatment position. The clinical setup was verified. Then port films were obtained and uploaded to the radiation oncology medical record software.  The treatment beams were carefully compared against the planned radiation fields. The position location and shape of the radiation fields was reviewed. They targeted volume of tissue appears to be appropriately covered by the radiation beams. Organs at risk appear to be excluded as planned.  Based on my personal review, I approved the simulation verification. The patient's treatment will proceed as planned.  -----------------------------------  Blair Promise, PhD, MD  This document serves as a record of services personally performed by Gery Pray, MD. It was created on his behalf by Clerance Lav, a trained medical scribe. The creation of this record is based on the scribe's personal observations and the provider's statements to them. This document has been checked and approved by the attending provider.

## 2019-08-11 NOTE — Progress Notes (Signed)
  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  Identity was confirmed.  All relevant records and images related to the planned course of therapy were reviewed.  The patient freely provided informed written consent to proceed with treatment after reviewing the details related to the planned course of therapy. The consent form was witnessed and verified by the simulation staff.  Then, the patient was set-up in a stable reproducible supine position for radiation therapy.  CT images were obtained.  Surface markings were placed.  The CT images were loaded into the planning software.  Then the target and avoidance structures were contoured.  Treatment planning then occurred.  The radiation prescription was entered and confirmed.  Then, I designed and supervised the construction of a total of 6 medically necessary complex treatment devices.  I have requested : 3D Simulation  I have requested a DVH of the following structures: heart, lungs, spinal cord, esophagus, GTV, PTV.  I have ordered: dose calc.  PLAN:  The patient will receive 35 Gy in 14 fractions directed at the large mass in the right mid and lower lung.  -----------------------------------  Blair Promise, PhD, MD  This document serves as a record of services personally performed by Gery Pray, MD. It was created on his behalf by Clerance Lav, a trained medical scribe. The creation of this record is based on the scribe's personal observations and the provider's statements to them. This document has been checked and approved by the attending provider.

## 2019-08-11 NOTE — Progress Notes (Signed)
NAME:  Jeremy [name], MRN:  [id number], DOB:  07-Dec-1945, LOS: 5 ADMISSION DATE:  08/06/2019, CONSULTATION DATE:  08/07/2019 REFERRING MD:  Dr. Cyndia Skeeters TRH, CHIEF COMPLAINT:  Pulmonary mass  Brief History   74yo male presented with hemoptysis, unintentional weight loss, persistent cough, and night sweats last several days.  Patient is a former smoker quit in 1993 after 25 years of smoking.  PCCM consulted after CTA chest revealed a large necrotic pulmonary mass  History of present illness   Jeremy [name] is a 74 year old male with a past medical history significant for hypertension, diabetes, COPD, former smoker, psoriatic arthritis who presented to the hospital with complaints of hemoptysis that began morning of arrival.  Patient also reports unintentional weight loss of approximately 20 pounds over the last year, cough, and chills.  He also reports progressive shortness of breath for the last couple weeks.  On arrival patient was evaluated for PE with CTA of the chest that revealed large necrotic pulmonary mass extending to the carina with associated obstruction of the right lower lobe bronchus. Lab work on admission significant for slightly elevated BNP, troponin, elevated slightly at 9, and WBC 16.6.  Vital signs on admission significant for mild tachycardia and tachypnea.  PCCM consulted to help assess pulmonary mass with likely need of bronchoscopy.  Past Medical History  Hypertension COPD Type 2 diabetes Former smoker Psoriatic arthritis  Significant Hospital Events   Admitted 3/3  Consults:    Procedures:    Significant Diagnostic Tests:  CTA chest 3/3 >  1. Negative for pulmonary embolus. 2. Large necrotic mass extends to the carina with associated obstruction of the right lower lobe bronchus. Postobstructive collapse/consolidation and necrosis in the right lower lobe. 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 3. 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 4. Borderline right paratracheal and subcarinal lymph nodes, likely metastatic. 5. Aortic atherosclerosis (ICD10-I70.0). Coronary artery calcification.  Micro Data:  COVID 3/3 > negative Blood culture 3/3 > ng  Antimicrobials:  Ceftriaxone 3/3 x1 Unasyn 3/3 >  Interim history/subjective:  Doing well over weekend. Borderline sats, probably needs O2 at home. Cough with brownish sputum.  Objective   Blood pressure 120/76, pulse 72, temperature 98.4 F (36.9 C), temperature source Oral, resp. rate 17, height 6\' 3"  (1.905 m), weight 81.8 kg, SpO2 (!) 88 %.        Intake/Output Summary (Last 24 hours) at 08/11/2019 1223 Last data filed at 08/11/2019 0900 Gross per 24 hour  Intake 237 ml  Output 870 ml  Net -633 ml   Filed Weights   08/07/19 0312 08/08/19 0500 08/10/19 0500  Weight: 72.6 kg 92.4 kg 81.8 kg    Examination: GEN: well appearing man in NAD HEENT: MMM, no thrush CV: RRR, ext warm PULM: Diminished on R, clear left, no accesory muscle use GI: Soft, +BS EXT: No edema NEURO: Moves all 4 ext, no obvious deficits PSYCH: AOx3, hard of hearing SKIN: No rashes  Resolved Hospital Problem list     Assessment & Plan:  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- post bronch 3/5; MRI brain without metastases - May need home O2 - To get radiation today - Continue anoro - OP radonc/hemeonc f/u  Associated pleural effusion- f/u path, looks malignant  Afib- resolved  Postobstructive PNA- complete course of augmentin  Pulmonology will sign off, appreciate TRH,  RadOnc help  Erskine Emery MD PCCM

## 2019-08-11 NOTE — Care Management Important Message (Signed)
Important Message  Patient Details IM Letter given to Marney Doctor RN Case Manager to present to the Patient Name: Jeison Delpilar MRN: [id number] Date of Birth: 05-15-1946   Medicare Important Message Given:  Yes     Kerin Salen 08/11/2019, 9:48 AM

## 2019-08-12 ENCOUNTER — Encounter (HOSPITAL_COMMUNITY): Payer: Self-pay | Admitting: Family Medicine

## 2019-08-12 ENCOUNTER — Ambulatory Visit
Admit: 2019-08-12 | Discharge: 2019-08-12 | Disposition: A | Discharge status: Home / Self Care | Payer: Medicare Other | Attending: Radiation Oncology | Admitting: Radiation Oncology

## 2019-08-12 DIAGNOSIS — Z794 Long term (current) use of insulin: Secondary | ICD-10-CM

## 2019-08-12 DIAGNOSIS — C3431 Malignant neoplasm of lower lobe, right bronchus or lung: Principal | ICD-10-CM

## 2019-08-12 DIAGNOSIS — J449 Chronic obstructive pulmonary disease, unspecified: Secondary | ICD-10-CM

## 2019-08-12 DIAGNOSIS — L4050 Arthropathic psoriasis, unspecified: Secondary | ICD-10-CM

## 2019-08-12 DIAGNOSIS — Z87891 Personal history of nicotine dependence: Secondary | ICD-10-CM

## 2019-08-12 DIAGNOSIS — I10 Essential (primary) hypertension: Secondary | ICD-10-CM

## 2019-08-12 DIAGNOSIS — E119 Type 2 diabetes mellitus without complications: Secondary | ICD-10-CM

## 2019-08-12 DIAGNOSIS — Z7952 Long term (current) use of systemic steroids: Secondary | ICD-10-CM

## 2019-08-12 DIAGNOSIS — Z79899 Other long term (current) drug therapy: Secondary | ICD-10-CM

## 2019-08-12 LAB — GLUCOSE, CAPILLARY
Glucose-Capillary: 142 mg/dL — ABNORMAL HIGH (ref 70–99)
Glucose-Capillary: 213 mg/dL — ABNORMAL HIGH (ref 70–99)
Glucose-Capillary: 197 mg/dL — ABNORMAL HIGH (ref 70–99)

## 2019-08-12 LAB — CBC
HCT: 27.8 % — ABNORMAL LOW (ref 39.0–52.0)
Hemoglobin: 8.5 g/dL — ABNORMAL LOW (ref 13.0–17.0)
MCH: 28.1 pg (ref 26.0–34.0)
MCHC: 30.6 g/dL (ref 30.0–36.0)
MCV: 91.7 fL (ref 80.0–100.0)
Platelets: 278 10*3/uL (ref 150–400)
RBC: 3.03 MIL/uL — ABNORMAL LOW (ref 4.22–5.81)
RDW: 18.5 % — ABNORMAL HIGH (ref 11.5–15.5)
WBC: 15.3 10*3/uL — ABNORMAL HIGH (ref 4.0–10.5)
nRBC: 0 % (ref 0.0–0.2)

## 2019-08-12 LAB — RENAL FUNCTION PANEL
Albumin: 2.4 g/dL — ABNORMAL LOW (ref 3.5–5.0)
Anion gap: 10 (ref 5–15)
BUN: 12 mg/dL (ref 8–23)
CO2: 26 mmol/L (ref 22–32)
Calcium: 8.3 mg/dL — ABNORMAL LOW (ref 8.9–10.3)
Chloride: 96 mmol/L — ABNORMAL LOW (ref 98–111)
Creatinine, Ser: 0.74 mg/dL (ref 0.61–1.24)
GFR calc Af Amer: >60 mL/min (ref >60)
GFR calc non Af Amer: >60 mL/min (ref >60)
Glucose, Bld: 107 mg/dL — ABNORMAL HIGH (ref 70–99)
Phosphorus: 3.2 mg/dL (ref 2.5–4.6)
Potassium: 4 mmol/L (ref 3.5–5.1)
Sodium: 132 mmol/L — ABNORMAL LOW (ref 135–145)

## 2019-08-12 LAB — MAGNESIUM: Magnesium: 1.7 mg/dL (ref 1.7–2.4)

## 2019-08-12 MED ORDER — UMECLIDINIUM-VILANTEROL 62.5-25 MCG/INH IN AEPB: 1.0000 | INHALATION_SPRAY | Freq: Every day | RESPIRATORY_TRACT | 0 refills | Status: DC

## 2019-08-12 MED ORDER — AMOXICILLIN-POT CLAVULANATE 875-125 MG PO TABS: 1.0000 | ORAL_TABLET | Freq: Two times a day (BID) | ORAL | 0 refills | Status: AC

## 2019-08-12 NOTE — Discharge Instructions (Signed)
Jeremy [name],  You were in the hospital because of pneumonia and a new lung cancer diagnosis. You will need to follow-up with pulmonology, medical oncology and radiation oncology as an outpatient. Please continue your antibiotics as prescribed. You will need to go home with oxygen but hopefully this will be short term.

## 2019-08-12 NOTE — Progress Notes (Signed)
SATURATION QUALIFICATIONS: (This note is used to comply with regulatory documentation for home oxygen)  Patient Saturations on Room Air at Rest = 90%  Patient Saturations on Room Air while Ambulating = 84%  Patient Saturations on 2 Liters of oxygen while Ambulating = 94%  Please briefly explain why patient needs home oxygen: desaturation without oxygen.

## 2019-08-12 NOTE — Consult Note (Addendum)
Jeremy [name]  [phone] [phone]   MEDICAL ONCOLOGY - INITIAL CONSULTATION  Referral MD: Dr. 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  He presented to the hospital with hemoptysis.  He also reported a 20 pound unintentional weight loss and chills for several days prior to admission.  He was also having progressive shortness of breath for several weeks prior to admission.  In the ER, a chest x-ray was obtained which was concerning for pneumonia.  WBC was 16.6, hemoglobin 10.7, BNP 104.  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  He was seen by pulmonology and underwent a bronchoscopy on 08/08/2019.  Biopsy was consistent with non-small cell lung cancer, squamous cell.  The cytology of his pleural fluid was negative for malignant cells. Additionally, he had an MRI of the brain with and without contrast performed on 08/10/2019 which showed no evidence of intracranial metastatic disease and a CT of the abdomen pelvis with contrast on 08/11/2019 which showed a small right-sided pleural effusion with incompletely visualized necrotic lung mass in the right lower lobe, otherwise negative CT exam of the abdomen pelvis without definite evidence of  metastatic disease.  He has been seen by radiation oncology and has started radiation directed to the right lung.  When seen today, the patient's wife is at the bedside. The patient reports an ongoing cough but hemoptysis has resolved. The patient reported that he has some anorexia and a 20 pound weight loss prior to this admission. The patient denied having any fevers or chills. He denies headaches. His wife reported an episode of dizziness at home. No visual disturbances. He denies chest pain but reports some discomfort near his bilateral shoulders. He has some mild shortness of breath. Denies abdominal pain, nausea, vomiting, constipation. Reports having some loose stools since admission which he attributes to antibiotics. He denies epistaxis, hematuria, melena, hematochezia. He reports no worsening of his arthritic symptoms since stopping methotrexate about a week ago. The patient is married and has no biological children. He has a stepchild. He is retired but previously worked on [employer]. He previously smoked a half a pack of cigarettes for about 25 years. He quit 1993. Reports drinking an occasional beer. He reports that he is adopted and his family history is unknown. Medical oncology was asked see the patient for recommendations regarding his newly diagnosed lung cancer.   Past Medical History:  Diagnosis Date  . COPD (chronic obstructive pulmonary disease) (HCC)    QUIT SMOKING 30 YRS AGO  . Diabetes ([place]) 2018  . HTN (hypertension)   . Psoriatic arthritis ([place]) 2015  :  Past Surgical History:  Procedure Laterality Date  . BACK SURGERY  1990  . BIOPSY  12/30/2018   Procedure: BIOPSY;  Surgeon: Danie Binder, MD;  [facility];  Service:  Endoscopy;;  gastric  . CATARACT EXTRACTION Bilateral   . CERVICAL SPINE SURGERY  1995  . COLONOSCOPY WITH PROPOFOL N/A 12/30/2018   Procedure: COLONOSCOPY WITH PROPOFOL;  Surgeon: Danie Binder, MD;  Location: [facility];  Service:  Endoscopy;  Laterality: N/A;  12:30pm  . ESOPHAGOGASTRODUODENOSCOPY (EGD) WITH PROPOFOL N/A 12/30/2018   Procedure: ESOPHAGOGASTRODUODENOSCOPY (EGD) WITH PROPOFOL;  Surgeon: Danie Binder, MD;  [facility]: [facility];  Service: Endoscopy;  Laterality: N/A;  . POLYPECTOMY  12/30/2018   Procedure: POLYPECTOMY;  Surgeon: Danie Binder, MD;  [facility]: [facility];  Service: Endoscopy;;  colon  :  Current Facility-Administered Medications  Medication Dose Route Frequency Provider Last Rate Last Admin  . 0.9 %  sodium chloride infusion  250 mL Intravenous PRN Wendee Beavers T, MD 10 mL/hr at 08/09/19 0200 Rate Verify at 08/09/19 0200  . acetaminophen (TYLENOL) tablet 650 mg  650 mg Oral Q6H PRN Wendee Beavers T, MD   650 mg at 08/10/19 0000   Or  . acetaminophen (TYLENOL) suppository 650 mg  650 mg Rectal Q6H PRN Wendee Beavers T, MD      . amoxicillin-clavulanate (AUGMENTIN) 875-125 MG per tablet 1 tablet  1 tablet Oral Q12H Mercy Riding, MD   1 tablet at 08/12/19 1004  . ascorbic acid (VITAMIN C) tablet 1,000 mg  1,000 mg Oral Daily Wendee Beavers T, MD   1,000 mg at 08/12/19 1004  . budesonide (PULMICORT) nebulizer solution 0.25 mg  0.25 mg Inhalation BID Wendee Beavers T, MD   0.25 mg at 08/12/19 0840  . diltiazem (CARDIZEM) tablet 30 mg  30 mg Oral Q6H PRN Gonfa, Taye T, MD      . feeding supplement (ENSURE ENLIVE) (ENSURE ENLIVE) liquid 237 mL  237 mL Oral TID BM Gonfa, Taye T, MD   237 mL at 08/12/19 1004  . ferrous sulfate tablet 325 mg  325 mg Oral Q supper Wendee Beavers T, MD   325 mg at 08/11/19 1700  . folic acid (FOLVITE) tablet 1 mg  1 mg Oral Daily Wendee Beavers T, MD   1 mg at 08/12/19 1004  . gabapentin (NEURONTIN) capsule 600 mg  600 mg Oral QHS Wendee Beavers T, MD   600 mg at 08/11/19 2244  . insulin aspart (novoLOG) injection 0-5 Units  0-5 Units Subcutaneous QHS Mercy Riding, MD   5 Units at 08/08/19 2240  . insulin aspart (novoLOG) injection 0-9 Units  0-9 Units Subcutaneous TID WC Mercy Riding, MD   1 Units at 08/12/19 0806  . ipratropium-albuterol (DUONEB) 0.5-2.5 (3) MG/3ML nebulizer solution 3 mL  3 mL Nebulization Q6H PRN Gonfa, Taye T, MD      . metoprolol tartrate (LOPRESSOR) injection 5 mg  5 mg Intravenous Q3H PRN Wendee Beavers T, MD   5 mg at 08/08/19 1531  . ondansetron (ZOFRAN) tablet 4 mg  4 mg Oral Q6H PRN Gonfa, Taye T, MD       Or  . ondansetron (ZOFRAN) injection 4 mg  4 mg Intravenous Q6H PRN Gonfa, Taye T, MD      . pantoprazole (PROTONIX) EC tablet 40 mg  40 mg Oral QAC breakfast Wendee Beavers T, MD   40 mg at 08/12/19 0806  . predniSONE (DELTASONE) tablet 10 mg  10 mg Oral Q breakfast Wendee Beavers T, MD   10 mg at 08/12/19 0806  . ramelteon (ROZEREM) tablet 8 mg  8 mg Oral QHS Gonfa, Taye  T, MD   8 mg at 08/11/19 2245  . sodium chloride flush (NS) 0.9 % injection 3 mL  3 mL Intravenous Q12H Gonfa, Taye T, MD   3 mL at 08/12/19 1004  . sodium chloride flush (NS) 0.9 % injection 3 mL  3 mL Intravenous PRN Gonfa, Taye T, MD      . traMADol (ULTRAM) tablet 50 mg  50 mg Oral Q6H PRN Wendee Beavers T, MD   50 mg at 08/11/19 1725  . umeclidinium-vilanterol (ANORO ELLIPTA) 62.5-25 MCG/INH 1 puff  1 puff Inhalation Daily Mercy Riding, MD   1 puff at 08/12/19 0839     Allergies  Allergen Reactions  . Aleve [Naproxen Sodium] Hives and Rash  :  Family History  Problem Relation Age of Onset  . Alcoholism Father   . CAD Brother   . Diabetes Brother   . Colon cancer Neg Hx   . Colon polyps Neg Hx   . Gastric cancer Neg Hx   :  Social History   Socioeconomic History  . Marital status: Married    Spouse name: Not on file  . Number of children: Not on file  . Years of education: Not on file  . Highest education level: Not on file  Occupational History  . Not on file  Tobacco Use  . Smoking status: Former Smoker    Packs/day: 0.50    Years: 50.00    Pack years: 25.00    Types: Cigarettes    Quit date: 12/26/1991    Years since quitting: 27.6  . Smokeless  tobacco: Never Used  Substance and Sexual Activity  . Alcohol use: Yes    Comment: occ beer  . Drug use: Never  . Sexual activity: Yes    Birth control/protection: None  Other Topics Concern  . 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 QBHALPF(7902). 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   Social Determinants of Health   Financial Resource Strain:   . Difficulty of Paying Living Expenses: Not on file  Food Insecurity:   . Worried About Charity fundraiser in the Last Year: Not on file  . Ran Out of Food in the Last Year: Not on file  Transportation Needs:   . Lack of Transportation (Medical): Not on file  . Lack of Transportation (Non-Medical): Not on file  Physical Activity:   . Days of Exercise per Week: Not on file  . Minutes of Exercise per Session: Not on file  Stress:   . Feeling of Stress : Not on file  Social Connections:   . Frequency of Communication with Friends and Family: Not on file  . Frequency of Social Gatherings with Friends and Family: Not on file  . Attends Religious Services: Not on file  . Active Member of Clubs or Organizations: Not on file  . Attends Archivist Meetings: Not on file  . Marital Status: Not on file  Intimate Partner Violence:   . Fear of Current or Ex-Partner: Not on file  . Emotionally Abused: Not on file  . Physically Abused: Not on file  . Sexually Abused: Not on file  :  Review of Systems: A comprehensive 14 point review of systems was negative except as noted in the HPI.  Exam: Patient Vitals for the past 24 hrs:  BP Temp Temp src Pulse Resp SpO2 Weight  08/12/19 0841 -- -- -- -- --  94 % --  08/12/19 0549 -- -- -- -- -- -- 77.7 kg  08/12/19 0515 106/63 98.7 F (37.1 C) Oral 87 18 90 % --  08/11/19 2049 134/82 98.3 F (36.8 C) Oral (!) 104 18 (!) 89 % --  08/11/19 2025 -- -- -- -- -- 92 % --  08/11/19 1259 117/69 98.1 F (36.7 C) Oral 83 16 94  % --    General:  well-nourished in no acute distress.   Eyes:  no scleral icterus.   ENT:  There were no oropharyngeal lesions.   Neck was without thyromegaly.   Lymphatics:  Negative cervical, supraclavicular or axillary adenopathy.   Respiratory: Diminished breath sounds Cardiovascular:  Regular rate and rhythm, S1/S2, without murmur, rub or gallop.  There was no pedal edema.   GI:  abdomen was soft, flat, nontender, nondistended, without organomegaly. Musculoskeletal:  no spinal tenderness of palpation of vertebral spine.   Skin exam was without echymosis, petichae.   Neuro exam was nonfocal. Patient was alert and oriented.  Attention was good.   Language was appropriate.  Mood was normal without depression.  Speech was not pressured.  Thought content was not tangential.     Lab Results  Component Value Date   WBC 15.3 (H) 08/12/2019   HGB 8.5 (L) 08/12/2019   HCT 27.8 (L) 08/12/2019   PLT 278 08/12/2019   GLUCOSE 107 (H) 08/12/2019   ALT 22 08/07/2019   AST 15 08/07/2019   NA 132 (L) 08/12/2019   K 4.0 08/12/2019   CL 96 (L) 08/12/2019   CREATININE 0.74 08/12/2019   BUN 12 08/12/2019   CO2 26 08/12/2019    CT ANGIO CHEST PE W OR WO CONTRAST  Result Date: 08/06/2019 CLINICAL DATA:  Persistent shortness of breath, hemoptysis, hypoxia. EXAM: CT ANGIOGRAPHY CHEST WITH CONTRAST TECHNIQUE: Multidetector CT imaging of the chest was performed using the standard protocol during bolus administration of intravenous contrast. Multiplanar CT image reconstructions and MIPs were obtained to evaluate the vascular anatomy. CONTRAST:  136m OMNIPAQUE IOHEXOL 350 MG/ML SOLN COMPARISON:  Chest radiograph 08/06/2019. FINDINGS: Cardiovascular: Negative for pulmonary embolus. Atherosclerotic calcification of the aorta and coronary arteries. Heart size normal. No pericardial effusion. Mediastinum/Nodes: Mediastinal lymph nodes measure up to 8 mm in the high right paratracheal station. Probable 1.5 cm  subcarinal lymph node. 10 mm right hilar lymph node. No left hilar or axillary lymph nodes. Prepericardiac lymph nodes are not enlarged by CT size criteria. Esophagus is grossly unremarkable. Lungs/Pleura: A cavitary mass in the right lower lobe extends 2 and involves the carina and measures approximately 6.5 x 9.9 cm. There is an air-fluid level within. Complete obstruction of the right lower lobe with collapse/consolidation and necrosis in the right lower lobe. Near complete obstruction of the right upper and middle lobe bronchi with extensive perilymphatic nodularity and mild septal thickening in the aerated right upper and middle lobes. Moderate right pleural effusion. Mild centrilobular emphysema. Left lung is clear. Debris is seen in the proximal left mainstem bronchus, in addition to the right bronchial tree, as discussed previously. Upper Abdomen: Visualized portions of the liver, adrenal glands, left kidney, spleen, pancreas, stomach and bowel are grossly unremarkable. No upper abdominal adenopathy. Musculoskeletal: Degenerative changes in the spine. Review of the MIP images confirms the above findings. IMPRESSION: 1. Negative for pulmonary embolus. 2. Large necrotic mass extends to the carina with associated obstruction of the right lower lobe bronchus. Postobstructive collapse/consolidation and necrosis in the right lower lobe. Findings  are most consistent with primary bronchogenic carcinoma. 3. 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 4. Borderline right paratracheal and subcarinal lymph nodes, likely metastatic. 5. Aortic atherosclerosis (ICD10-I70.0). Coronary artery calcification. Electronically Signed   By: Lorin Picket M.D.   On: 08/06/2019 13:40   MR BRAIN W WO CONTRAST  Result Date: 08/10/2019 CLINICAL DATA:  Lung cancer, staging EXAM: MRI HEAD WITHOUT AND WITH CONTRAST  TECHNIQUE: Multiplanar, multiecho pulse sequences of the brain and surrounding structures were obtained without and with intravenous contrast. CONTRAST:  26m GADAVIST GADOBUTROL 1 MMOL/ML IV SOLN COMPARISON:  None. FINDINGS: Brain: There is no acute infarction or intracranial hemorrhage. There is no intracranial mass, mass effect, or edema. There is no hydrocephalus or extra-axial fluid collection. Prominence of the ventricles and sulci reflects mild generalized parenchymal volume loss. Patchy foci of T2 hyperintensity in the supratentorial and pontine white matter are nonspecific but may reflect mild chronic microvascular ischemic changes. Incidental note is made of a small cerebellar developmental venous anomaly. Vascular: Major vessel flow voids at the skull base are preserved. Skull and upper cervical spine: Normal marrow signal is preserved. Sinuses/Orbits: Mild mucosal thickening. Bilateral lens replacements. Other: Sella is unremarkable.  Mastoid air cells are clear. IMPRESSION: No evidence of intracranial metastatic disease. Mild chronic microvascular ischemic changes. Electronically Signed   By: PMacy MisM.D.   On: 08/10/2019 12:52   CT ABDOMEN PELVIS W CONTRAST  Result Date: 08/11/2019 CLINICAL DATA:  Lung cancer assess for metastatic disease EXAM: CT ABDOMEN AND PELVIS WITH CONTRAST TECHNIQUE: Multidetector CT imaging of the abdomen and pelvis was performed using the standard protocol following bolus administration of intravenous contrast. CONTRAST:  1071mOMNIPAQUE IOHEXOL 300 MG/ML  SOLN COMPARISON:  CT chest 08/06/2019, MRI 06/03/2019, 08/10/2019 FINDINGS: Lower chest: Small right-sided pleural effusion with volume loss in the right thorax and shift of mediastinal contents to the right. Incompletely visualized necrotic lung mass in the right lower lobe. Normal heart size. Trace pericardial effusion. Subcentimeter right cardio phrenic lymph nodes. Hepatobiliary: No focal hepatic abnormality. No  calcified gallstone or biliary dilatation. Pancreas: Unremarkable. No pancreatic ductal dilatation or surrounding inflammatory changes. Spleen: Normal in size without focal abnormality. Adrenals/Urinary Tract: Right adrenal gland is normal. Nodular thickening of left adrenal gland without discrete mass. The kidneys show no hydronephrosis. Subcentimeter hypodensity in the mid left kidney, too small to further characterize. The bladder is normal. Stomach/Bowel: Stomach is within normal limits. Appendix appears normal. No evidence of bowel wall thickening, distention, or inflammatory changes. Scattered diverticula in the sigmoid colon without acute inflammatory change Vascular/Lymphatic: Moderate aortic atherosclerosis. Tortuous aorta with focally ectatic infrarenal aorta up to 2.8 cm. No significant adenopathy. Reproductive: Prostate is unremarkable. Other: Negative for free fluid or free air. Small fat containing right inguinal hernia. Musculoskeletal: No acute or suspicious osseous abnormality. Degenerative changes most advanced at L4-L5. IMPRESSION: 1. Small right-sided pleural effusion with incompletely visualized necrotic lung mass in the right lower lobe. Volume loss on the right with shift of mediastinal contents to the right. 2. Otherwise negative CT examination of the abdomen and pelvis. No definitive evidence for metastatic disease Electronically Signed   By: KiDonavan Foil.D.   On: 08/11/2019 19:52   DG Chest Port 1 View  Result Date: 08/06/2019 CLINICAL DATA:  Shortness of breath EXAM: PORTABLE CHEST 1 VIEW COMPARISON:  06/20/2019 FINDINGS: Volume loss on the right. Airspace disease  throughout the right lung concerning for pneumonia. Small right pleural effusion. Left lung clear. Heart is normal size. No acute bony abnormality. IMPRESSION: Airspace disease throughout the right lung with small right effusion. Findings concerning for pneumonia. Volume loss on the right. Electronically Signed   By: Rolm Baptise M.D.   On: 08/06/2019 12:26     CT ANGIO CHEST PE W OR WO CONTRAST  Result Date: 08/06/2019 CLINICAL DATA:  Persistent shortness of breath, hemoptysis, hypoxia. EXAM: CT ANGIOGRAPHY CHEST WITH CONTRAST TECHNIQUE: Multidetector CT imaging of the chest was performed using the standard protocol during bolus administration of intravenous contrast. Multiplanar CT image reconstructions and MIPs were obtained to evaluate the vascular anatomy. CONTRAST:  131m OMNIPAQUE IOHEXOL 350 MG/ML SOLN COMPARISON:  Chest radiograph 08/06/2019. FINDINGS: Cardiovascular: Negative for pulmonary embolus. Atherosclerotic calcification of the aorta and coronary arteries. Heart size normal. No pericardial effusion. Mediastinum/Nodes: Mediastinal lymph nodes measure up to 8 mm in the high right paratracheal station. Probable 1.5 cm subcarinal lymph node. 10 mm right hilar lymph node. No left hilar or axillary lymph nodes. Prepericardiac lymph nodes are not enlarged by CT size criteria. Esophagus is grossly unremarkable. Lungs/Pleura: A cavitary mass in the right lower lobe extends 2 and involves the carina and measures approximately 6.5 x 9.9 cm. There is an air-fluid level within. Complete obstruction of the right lower lobe with collapse/consolidation and necrosis in the right lower lobe. Near complete obstruction of the right upper and middle lobe bronchi with extensive perilymphatic nodularity and mild septal thickening in the aerated right upper and middle lobes. Moderate right pleural effusion. Mild centrilobular emphysema. Left lung is clear. Debris is seen in the proximal left mainstem bronchus, in addition to the right bronchial tree, as discussed previously. Upper Abdomen: Visualized portions of the liver, adrenal glands, left kidney, spleen, pancreas, stomach and bowel are grossly unremarkable. No upper abdominal adenopathy. Musculoskeletal: Degenerative changes in the spine. Review of the MIP images confirms the above  findings. IMPRESSION: 1. Negative for pulmonary embolus. 2. Large necrotic mass extends to the carina with associated obstruction of the right lower lobe bronchus. Postobstructive collapse/consolidation and necrosis in the right lower lobe. 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 3. 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 4. Borderline right paratracheal and subcarinal lymph nodes, likely metastatic. 5. Aortic atherosclerosis (ICD10-I70.0). Coronary artery calcification. Electronically Signed   By: MLorin PicketM.D.   On: 08/06/2019 13:40   MR BRAIN W WO CONTRAST  Result Date: 08/10/2019 CLINICAL DATA:  Lung cancer, staging EXAM: MRI HEAD WITHOUT AND WITH CONTRAST TECHNIQUE: Multiplanar, multiecho pulse sequences of the brain and surrounding structures were obtained without and with intravenous contrast. CONTRAST:  861mGADAVIST GADOBUTROL 1 MMOL/ML IV SOLN COMPARISON:  None. FINDINGS: Brain: There is no acute infarction or intracranial hemorrhage. There is no intracranial mass, mass effect, or edema. There is no hydrocephalus or extra-axial fluid collection. Prominence of the ventricles and sulci reflects mild generalized parenchymal volume loss. Patchy foci of T2 hyperintensity in the supratentorial and pontine white matter are nonspecific but may reflect mild chronic microvascular ischemic changes. Incidental note is made of a small cerebellar developmental venous anomaly. Vascular: Major vessel flow voids at the skull base are preserved. Skull and upper cervical spine: Normal marrow signal is preserved. Sinuses/Orbits: Mild mucosal thickening. Bilateral lens replacements. Other: Sella is unremarkable.  Mastoid air cells are  clear. IMPRESSION: No evidence of intracranial metastatic disease. Mild chronic microvascular ischemic changes. Electronically Signed   By:  Macy Mis M.D.   On: 08/10/2019 12:52   CT ABDOMEN PELVIS W CONTRAST  Result Date: 08/11/2019 CLINICAL DATA:  Lung cancer assess for metastatic disease EXAM: CT ABDOMEN AND PELVIS WITH CONTRAST TECHNIQUE: Multidetector CT imaging of the abdomen and pelvis was performed using the standard protocol following bolus administration of intravenous contrast. CONTRAST:  164m OMNIPAQUE IOHEXOL 300 MG/ML  SOLN COMPARISON:  CT chest 08/06/2019, MRI 06/03/2019, 08/10/2019 FINDINGS: Lower chest: Small right-sided pleural effusion with volume loss in the right thorax and shift of mediastinal contents to the right. Incompletely visualized necrotic lung mass in the right lower lobe. Normal heart size. Trace pericardial effusion. Subcentimeter right cardio phrenic lymph nodes. Hepatobiliary: No focal hepatic abnormality. No calcified gallstone or biliary dilatation. Pancreas: Unremarkable. No pancreatic ductal dilatation or surrounding inflammatory changes. Spleen: Normal in size without focal abnormality. Adrenals/Urinary Tract: Right adrenal gland is normal. Nodular thickening of left adrenal gland without discrete mass. The kidneys show no hydronephrosis. Subcentimeter hypodensity in the mid left kidney, too small to further characterize. The bladder is normal. Stomach/Bowel: Stomach is within normal limits. Appendix appears normal. No evidence of bowel wall thickening, distention, or inflammatory changes. Scattered diverticula in the sigmoid colon without acute inflammatory change Vascular/Lymphatic: Moderate aortic atherosclerosis. Tortuous aorta with focally ectatic infrarenal aorta up to 2.8 cm. No significant adenopathy. Reproductive: Prostate is unremarkable. Other: Negative for free fluid or free air. Small fat containing right inguinal hernia. Musculoskeletal: No acute or suspicious osseous abnormality. Degenerative changes most advanced at L4-L5. IMPRESSION: 1. Small right-sided pleural effusion with  incompletely visualized necrotic lung mass in the right lower lobe. Volume loss on the right with shift of mediastinal contents to the right. 2. Otherwise negative CT examination of the abdomen and pelvis. No definitive evidence for metastatic disease Electronically Signed   By: KDonavan FoilM.D.   On: 08/11/2019 19:52   DG Chest Port 1 View  Result Date: 08/06/2019 CLINICAL DATA:  Shortness of breath EXAM: PORTABLE CHEST 1 VIEW COMPARISON:  06/20/2019 FINDINGS: Volume loss on the right. Airspace disease throughout the right lung concerning for pneumonia. Small right pleural effusion. Left lung clear. Heart is normal size. No acute bony abnormality. IMPRESSION: Airspace disease throughout the right lung with small right effusion. Findings concerning for pneumonia. Volume loss on the right. Electronically Signed   By: KRolm BaptiseM.D.   On: 08/06/2019 12:26    Pathology:  SURGICAL PATHOLOGY  CASE: [id number]  PATIENT: Jeremy [name]  Surgical Pathology Report   Clinical History: lung mass (cm)   FINAL MICROSCOPIC DIAGNOSIS:   A. LUNG, RIGHT MAIN STEM ENDOBRONCHIAL, BIOPSY:  - Squamous cell carcinoma.  - See comment.   COMMENT:  There is sufficient tissue for additional testing.   INTRAOPERATIVE DIAGNOSIS:  A. Right mainstem endobronchial mass: "Squamous cell carcinoma."  Intraoperative diagnosis rendered by Dr. BMelina Copaat 2:24 PM on 08/08/2019.   GROSS DESCRIPTION:  Received fresh are fragments of soft tan-white hemorrhagic tissue  measuring 1.2 x 1.2 x 0.3 cm in aggregate. The specimen is entirely  submitted for frozen section in 1 block. [facility]   Final Diagnosis performed by JClaudette Laws MD.  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  The diagnosis, prognosis, and treatment options were discussed with the patient.  Recommend for him to complete his course of radiation under the direction of Dr. Sondra Come.  Recommend sending tissue for PD-L1 testing.  We will also need to arrange for an outpatient PET scan once he is discharged.  We will arrange for outpatient follow-up with medical oncology for additional discussion regarding his treatment options following discharge.  Thank you for this referral.   Mikey Bussing, DNP, AGPCNP-BC, AOCNP  ADDENDUM: Hematology/Oncology Attending: I had a face-to-face encounter with the patient today.  I recommended his care plan and agree with the above note.  This is a very pleasant 74 years old white male from [facility] he was admitted with hemoptysis.  Imaging studies including CT angiogram of the chest on August 06, 2019 showed a cavitary mass in the right lower lobe extending and involving the carina and measuring 6.5 x 9.9 cm with air-fluid level and complete obstruction of the right lower lobe with collapse and consolidation as well as necrosis of the right lower lobe.  There was also near complete obstruction of the right upper and middle lobe bronchi with extensive perilymphatic nodularity and mild septal thickening in the related right upper and middle lobes.  There was also moderate right pleural effusion.  The patient underwent bronchoscopy under the care of Dr. Halford Chessman and the final pathology [phone]) was consistent with squamous cell carcinoma.  We will request PD-L1 expression on the tissue biopsy. The patient is currently undergoing a course of palliative radiotherapy to the obstructive lung mass. His restaging work-up including MRI of the brain as well as CT scan of the abdomen pelvis showed no other evidence of metastatic  disease. This is likely a stage IIIC/IV (T4, N2, M0/M1a) non-small cell lung cancer, squamous cell carcinoma with potential malignant right pleural effusion. I had a lengthy discussion with the patient today about his current condition and treatment options. I recommended for the patient to continue with the course of palliative radiotherapy as planned by Dr. Sondra Come. I will arrange for the patient to come to the [place] after discharge for more detailed discussion of his systemic treatment options but most likely will be treated with a course of carboplatin, paclitaxel and Keytruda. I may also consider the patient for a PET scan to complete the staging work-up and to rule out any other metastatic disease. The patient agreed to the current plan. Thank you so much for allowing me to participate in the care of Mr. Gaddie, I will continue to follow up the patient with you and assist in his management.  Disclaimer: This note was dictated with voice recognition software. Similar sounding words can inadvertently be transcribed and may be missed upon review. Eilleen Kempf, MD

## 2019-08-12 NOTE — Discharge Summary (Signed)
Physician Discharge Summary  Renton Berkley TTS:[id number] DOB: September 16, 1945 DOA: 08/06/2019  PCP: [name], MD  Admit date: 08/06/2019 Discharge date: 08/12/2019  Admitted From: Home Disposition: Home  Recommendations for Outpatient Follow-up:  1. Follow up with PCP in 1 week 2. Follow up with hematology/oncology, radiation oncology and pulmonology 3. Please follow up on the following pending results: None  Home Health: None Equipment/Devices: Oxygen  Discharge Condition: Stable CODE STATUS: Full code Diet recommendation: Heart healthy   Brief/Interim Summary:  Admission HPI written by Murlean Iba, MD   Chief Complaint: Hemoptysis   HPI: Jeremy [name] is a 74 y.o. male former smoker who quit in 1993 after 25 years reports that for the last year he has continued to lose weight and reports about 20 pounds unintentional weight loss has been coughing and having chills for past several days.  He became concerned when he started coughing up blood this morning.  He could taste the blood in the back of his mouth.  He has COPD, HTN, type 2 DM and  Psoriatic arthritis but reports that these conditions have been stable.   He denies having fever.  He reports generalized body aches.  He reports progressive shortness of breath worse over past couple of weeks.  He does not use oxygen at home.    He was evaluated in ED with initial concern for PE, COPD exacerbation and Covid 19 infection.  His POC covid test was negative.  CXR was concerning for possible pneumonia.  BNP was 104.0.  Troponin was 9.  WBC was 16.6.  Hg was 10.0.  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  Pt was hypoxic and tachypneic on arrival and placed on 3L [place] with improvement in oxygen saturation.  I spoke with Dr. Governor Rooks with PCCM who said that patient would have to be admitted to [facility] because there are no  bronchoscopy services available at [facility] at this time.   The patient was started on IV antibiotics for presumed postobstructive pneumonia.     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 MRI brain obtained without evidence of metastasis. Radiation oncology started radiation treatment on 3/8. Patient to follow-up with medical/radiation oncology for continued treatments.  Pleural effusion Concern for malignant effusion. Cytology (3/5) obtained and pending.  Postobstructive pneumonia Secondary to above. Treated with Unaysn and transitioned to Augmentin. Discharged on Augmentin to complete a 10 day course.   COPD exacerbation Resolved. Treated with prednisone burst with taper down to home dose in addition to Duonebs. Continue Anora Ellipta.  Paroxysmal atrial fibrillation with RVR New diagnosis. Possibly provoked by epinephrine use for hemostasis during bronchoscopy. Amiodarone IV started with conversion to normal sinus rhythm. Amiodarone discontinued. No anticoagulation initiated.  Diabetes mellitus, type 2 Hemoglobin A1C of 7.0%. On metformin as an outpatient. Continue metformin on discharge.  Essential hypertension On lisinopril as an outpatient. Discontinued on discharge secondary to soft blood pressure.  Severe malnutrition Continue Ensure Enlive  Discharge Diagnoses:  Active Problems:   Hemoptysis   Mass of right lung   Former smoker   COPD (chronic obstructive pulmonary disease) (HCC)   Diabetes ([place])   HTN (hypertension)   Abnormal weight loss   Leukocytosis   Acute and chronic respiratory failure with hypoxia (HCC)   Anemia in neoplastic disease   Hyperglycemia  Lung mass   Postobstructive pneumonia   Protein-calorie malnutrition, severe   Atrial fibrillation with RVR ([place])    Discharge Instructions   Allergies as of 08/12/2019      Reactions   Aleve [naproxen Sodium] Hives, Rash      Medication  List    STOP taking these medications   lisinopril 20 MG tablet Commonly known as: ZESTRIL     TAKE these medications   acetaminophen 500 MG tablet Commonly known as: TYLENOL Take 500 mg by mouth every 4 (four) hours as needed (for pain.).   albuterol 108 (90 Base) MCG/ACT inhaler Commonly known as: VENTOLIN HFA Inhale 2 puffs into the lungs every 6 (six) hours as needed for wheezing or shortness of breath.   amoxicillin-clavulanate 875-125 MG tablet Commonly known as: AUGMENTIN Take 1 tablet by mouth every 12 (twelve) hours for 4 days.   cyclobenzaprine 10 MG tablet Commonly known as: FLEXERIL Take 10 mg by mouth at bedtime.   doxycycline 50 MG capsule Commonly known as: MONODOX Take 50 mg by mouth 2 (two) times daily as needed (rosacea flare ups.).   ferrous sulfate 325 (65 FE) MG tablet Take 325 mg by mouth daily with supper.   Fish Oil 1000 MG Caps Take 1,000 mg by mouth daily.   Flovent HFA 110 MCG/ACT inhaler Generic drug: fluticasone 1 puff 2 (two) times daily.   folic acid 1 MG tablet Commonly known as: FOLVITE Take 1 mg by mouth daily.   gabapentin 300 MG capsule Commonly known as: NEURONTIN Take 600 mg by mouth at bedtime.   metFORMIN 500 MG 24 hr tablet Commonly known as: GLUCOPHAGE-XR Take 500-1,000 mg by mouth See admin instructions. Take 2 tablets (1000 mg) by mouth in the morning & 1 tablet (500 mg) by mouth at night.   methotrexate 50 MG/2ML injection Inject 17.5 mg into the muscle every Friday.   metroNIDAZOLE 0.75 % cream Commonly known as: METROCREAM Apply 1 application topically 2 (two) times daily as needed (facial rosacea).   mometasone 0.1 % cream Commonly known as: ELOCON Apply 1 application topically 2 (two) times daily as needed (psoriasis).   pantoprazole 40 MG tablet Commonly known as: Protonix 1 po 30 mins prior to first meal   predniSONE 5 MG tablet Commonly known as: DELTASONE Take 5 mg by mouth daily with breakfast.     traMADol 50 MG tablet Commonly known as: ULTRAM Take 50 mg by mouth every 4 (four) hours as needed. for pain   umeclidinium-vilanterol 62.5-25 MCG/INH Aepb Commonly known as: ANORO ELLIPTA Inhale 1 puff into the lungs daily. Start taking on: August 13, 2019   vitamin C 1000 MG tablet Take 1,000 mg by mouth daily.            Durable Medical Equipment  (From admission, onward)         Start     Ordered   08/12/19 1544  For home use only DME oxygen  Once    Question Answer Comment  Length of Need 6 Months   Mode or (Route) Nasal cannula   Liters per Minute 2   Frequency Continuous (stationary and portable oxygen unit needed)   Oxygen delivery system Gas      08/12/19 1544          Allergies  Allergen Reactions   Aleve [Naproxen Sodium] Hives and Rash    Consultations:  Medical oncology  Radiation oncology  Pulmonology   Procedures/Studies: CT ANGIO CHEST PE W OR  WO CONTRAST  Result Date: 08/06/2019 CLINICAL DATA:  Persistent shortness of breath, hemoptysis, hypoxia. EXAM: CT ANGIOGRAPHY CHEST WITH CONTRAST TECHNIQUE: Multidetector CT imaging of the chest was performed using the standard protocol during bolus administration of intravenous contrast. Multiplanar CT image reconstructions and MIPs were obtained to evaluate the vascular anatomy. CONTRAST:  169mL OMNIPAQUE IOHEXOL 350 MG/ML SOLN COMPARISON:  Chest radiograph 08/06/2019. FINDINGS: Cardiovascular: Negative for pulmonary embolus. Atherosclerotic calcification of the aorta and coronary arteries. Heart size normal. No pericardial effusion. Mediastinum/Nodes: Mediastinal lymph nodes measure up to 8 mm in the high right paratracheal station. Probable 1.5 cm subcarinal lymph node. 10 mm right hilar lymph node. No left hilar or axillary lymph nodes. Prepericardiac lymph nodes are not enlarged by CT size criteria. Esophagus is grossly unremarkable. Lungs/Pleura: A cavitary mass in the right lower lobe extends 2 and  involves the carina and measures approximately 6.5 x 9.9 cm. There is an air-fluid level within. Complete obstruction of the right lower lobe with collapse/consolidation and necrosis in the right lower lobe. Near complete obstruction of the right upper and middle lobe bronchi with extensive perilymphatic nodularity and mild septal thickening in the aerated right upper and middle lobes. Moderate right pleural effusion. Mild centrilobular emphysema. Left lung is clear. Debris is seen in the proximal left mainstem bronchus, in addition to the right bronchial tree, as discussed previously. Upper Abdomen: Visualized portions of the liver, adrenal glands, left kidney, spleen, pancreas, stomach and bowel are grossly unremarkable. No upper abdominal adenopathy. Musculoskeletal: Degenerative changes in the spine. Review of the MIP images confirms the above findings. IMPRESSION: 1. Negative for pulmonary embolus. 2. Large necrotic mass extends to the carina with associated obstruction of the right lower lobe bronchus. Postobstructive collapse/consolidation and necrosis in the right lower lobe. 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 3. 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 4. Borderline right paratracheal and subcarinal lymph nodes, likely metastatic. 5. Aortic atherosclerosis (ICD10-I70.0). Coronary artery calcification. Electronically Signed   By: Lorin Picket M.D.   On: 08/06/2019 13:40   MR BRAIN W WO CONTRAST  Result Date: 08/10/2019 CLINICAL DATA:  Lung cancer, staging EXAM: MRI HEAD WITHOUT AND WITH CONTRAST TECHNIQUE: Multiplanar, multiecho pulse sequences of the brain and surrounding structures were obtained without and with intravenous contrast. CONTRAST:  26mL GADAVIST GADOBUTROL 1 MMOL/ML IV SOLN COMPARISON:  None. FINDINGS: Brain: There is no acute infarction or  intracranial hemorrhage. There is no intracranial mass, mass effect, or edema. There is no hydrocephalus or extra-axial fluid collection. Prominence of the ventricles and sulci reflects mild generalized parenchymal volume loss. Patchy foci of T2 hyperintensity in the supratentorial and pontine white matter are nonspecific but may reflect mild chronic microvascular ischemic changes. Incidental note is made of a small cerebellar developmental venous anomaly. Vascular: Major vessel flow voids at the skull base are preserved. Skull and upper cervical spine: Normal marrow signal is preserved. Sinuses/Orbits: Mild mucosal thickening. Bilateral lens replacements. Other: Sella is unremarkable.  Mastoid air cells are clear. IMPRESSION: No evidence of intracranial metastatic disease. Mild chronic microvascular ischemic changes. Electronically Signed   By: Macy Mis M.D.   On: 08/10/2019 12:52   CT ABDOMEN PELVIS W CONTRAST  Result Date: 08/11/2019 CLINICAL DATA:  Lung cancer assess for metastatic disease EXAM: CT ABDOMEN AND PELVIS WITH CONTRAST TECHNIQUE: Multidetector CT imaging of the abdomen and pelvis was performed using  the standard protocol following bolus administration of intravenous contrast. CONTRAST:  144mL OMNIPAQUE IOHEXOL 300 MG/ML  SOLN COMPARISON:  CT chest 08/06/2019, MRI 06/03/2019, 08/10/2019 FINDINGS: Lower chest: Small right-sided pleural effusion with volume loss in the right thorax and shift of mediastinal contents to the right. Incompletely visualized necrotic lung mass in the right lower lobe. Normal heart size. Trace pericardial effusion. Subcentimeter right cardio phrenic lymph nodes. Hepatobiliary: No focal hepatic abnormality. No calcified gallstone or biliary dilatation. Pancreas: Unremarkable. No pancreatic ductal dilatation or surrounding inflammatory changes. Spleen: Normal in size without focal abnormality. Adrenals/Urinary Tract: Right adrenal gland is normal. Nodular thickening of  left adrenal gland without discrete mass. The kidneys show no hydronephrosis. Subcentimeter hypodensity in the mid left kidney, too small to further characterize. The bladder is normal. Stomach/Bowel: Stomach is within normal limits. Appendix appears normal. No evidence of bowel wall thickening, distention, or inflammatory changes. Scattered diverticula in the sigmoid colon without acute inflammatory change Vascular/Lymphatic: Moderate aortic atherosclerosis. Tortuous aorta with focally ectatic infrarenal aorta up to 2.8 cm. No significant adenopathy. Reproductive: Prostate is unremarkable. Other: Negative for free fluid or free air. Small fat containing right inguinal hernia. Musculoskeletal: No acute or suspicious osseous abnormality. Degenerative changes most advanced at L4-L5. IMPRESSION: 1. Small right-sided pleural effusion with incompletely visualized necrotic lung mass in the right lower lobe. Volume loss on the right with shift of mediastinal contents to the right. 2. Otherwise negative CT examination of the abdomen and pelvis. No definitive evidence for metastatic disease Electronically Signed   By: Donavan Foil M.D.   On: 08/11/2019 19:52   DG Chest Port 1 View  Result Date: 08/06/2019 CLINICAL DATA:  Shortness of breath EXAM: PORTABLE CHEST 1 VIEW COMPARISON:  06/20/2019 FINDINGS: Volume loss on the right. Airspace disease throughout the right lung concerning for pneumonia. Small right pleural effusion. Left lung clear. Heart is normal size. No acute bony abnormality. IMPRESSION: Airspace disease throughout the right lung with small right effusion. Findings concerning for pneumonia. Volume loss on the right. Electronically Signed   By: Rolm Baptise M.D.   On: 08/06/2019 12:26      THORACENTESIS (3/5)  BRONCHOSCOPY (3/5) Bronchoscope entered from theET tube Large tumor seen blocking right mainstem bronchus all the way to the carina,covered with purulent secretions which were suctioned off  .Bronchial washings were is obtained from the right mainstem. The tumor was washed with cold saline and diluted epinephrine .Cryoprobe was then used by Dr Corey Skains obtain multiple biopsies from this tumor.There was bleeding which was controlled with cryoprobe and another aliquot of 1: 1000epinephrine diluted to 20 cc.Good hemostasis was achieved prior to withdrawing bronchoscope  Frozen section showed squamous cell carcinoma   Subjective: Some mild cough with mostly clear sputum. Some brownish sputum. Afebrile.  Discharge Exam: Vitals:   08/12/19 1303 08/12/19 1508  BP: (!) 96/45 117/68  Pulse: 76 79  Resp: (!) 22 18  Temp: 97.9 F (36.6 C) 97.9 F (36.6 C)  SpO2: 93% 96%   Vitals:   08/12/19 0549 08/12/19 0841 08/12/19 1303 08/12/19 1508  BP:   (!) 96/45 117/68  Pulse:   76 79  Resp:   (!) 22 18  Temp:   97.9 F (36.6 C) 97.9 F (36.6 C)  TempSrc:   Oral Oral  SpO2:  94% 93% 96%  Weight: 77.7 kg     Height:        General: Pt is alert, awake, not in acute distress Cardiovascular: RRR, S1/S2 +,  no rubs, no gallops Respiratory: Diminished, no wheezing, no rhonchi Abdominal: Soft, NT, ND, bowel sounds + Extremities: no edema, no cyanosis    The results of significant diagnostics from this hospitalization (including imaging, microbiology, ancillary and laboratory) are listed below for reference.     Microbiology: Recent Results (from the past 240 hour(s))  Culture, blood (single) w Reflex to ID Panel     Status: None   Collection Time: 08/06/19  1:50 PM   Specimen: Left Antecubital; Blood  Result Value Ref Range Status   Specimen Description LEFT ANTECUBITAL  Final   Special Requests   Final    BOTTLES DRAWN AEROBIC AND ANAEROBIC Blood Culture adequate volume   Culture   Final    NO GROWTH 5 DAYS Performed at [facility], [address], [place], [place] [address]    Report Status 08/11/2019 FINAL  Final  SARS CORONAVIRUS 2 (TAT 6-24 HRS)  Nasopharyngeal Nasopharyngeal Swab     Status: None   Collection Time: 08/06/19  2:28 PM   Specimen: Nasopharyngeal Swab  Result Value Ref Range Status   SARS Coronavirus 2 NEGATIVE NEGATIVE Final    Comment: (NOTE) SARS-CoV-2 target nucleic acids are NOT DETECTED. The SARS-CoV-2 RNA is generally detectable in upper and lower respiratory specimens during the acute phase of infection. Negative results do not preclude SARS-CoV-2 infection, do not rule out co-infections with other pathogens, and should not be used as the sole basis for treatment or other patient management decisions. Negative results must be combined with clinical observations, patient history, and epidemiological information. The expected result is Negative. Fact Sheet for Patients: SugarRoll.be Fact Sheet for Healthcare Providers: https://www.woods-mathews.com/ This test is not yet approved or cleared by the [place] FDA and  has been authorized for detection and/or diagnosis of SARS-CoV-2 by FDA under an Emergency Use Authorization (EUA). This EUA will remain  in effect (meaning this test can be used) for the duration of the COVID-19 declaration under Section 56 4(b)(1) of the Act, 21 U.S.C. section 360bbb-3(b)(1), unless the authorization is terminated or revoked sooner. Performed at [facility], [place] [address], [place], [place] [address]   Culture, respiratory     Status: None   Collection Time: 08/08/19  2:06 PM   Specimen: Bronchial Washing, Right; Respiratory  Result Value Ref Range Status   Specimen Description BRONCHIAL WASHINGS RIGHT  Final   Special Requests NONE  Final   Gram Stain   Final    FEW WBC PRESENT,BOTH PMN AND MONONUCLEAR NO ORGANISMS SEEN    Culture   Final    NO GROWTH Performed at [facility], [address], [place], [place] [address]    Report Status 08/10/2019 FINAL  Final  Acid Fast Smear (AFB)     Status: None    Collection Time: 08/08/19  2:06 PM   Specimen: Bronchial Washing, Right; Respiratory  Result Value Ref Range Status   AFB Specimen Processing Comment  Final    Comment: Tissue Grinding and Digestion/Decontamination   Acid Fast Smear Negative  Final    Comment: (NOTE) Performed At: [facility] [place], [place] [id number] Rush Farmer MD [id number]    Source (AFB) BRONCHIAL WASHINGS  Final    Comment: RIGHT Performed at [facility], [place] [address], [place],  [address]   Body fluid culture     Status: None   Collection Time: 08/08/19  2:17 PM   Specimen: PATH Cytology Pleural fluid; Body  Fluid  Result Value Ref Range Status   Specimen Description FLUID RIGHT PLEURAL  Final   Special Requests NONE  Final   Gram Stain   Final    ABUNDANT WBC PRESENT, PREDOMINANTLY MONONUCLEAR NO ORGANISMS SEEN    Culture   Final    NO GROWTH 3 DAYS Performed at [facility], [address], [place], [place] [address]    Report Status 08/11/2019 FINAL  Final     Labs: BNP (last 3 results) Recent Labs    08/06/19 1211  BNP 517.6*   Basic Metabolic Panel: Recent Labs  Lab 08/08/19 0432 08/09/19 0414 08/10/19 0244 08/11/19 0547 08/12/19 0539  NA 139 138 139 136 132*  K 4.3 4.6 3.9 4.2 4.0  CL 99 101 101 99 96*  CO2 28 26 29 29 26   GLUCOSE 117* 162* 104* 105* 107*  BUN 13 21 18 14 12   CREATININE 0.76 0.88 0.71 0.72 0.74  CALCIUM 8.8* 8.6* 8.3* 8.5* 8.3*  MG 2.0 1.9 2.0 1.8 1.7  PHOS 3.5 3.8 3.3 3.3 3.2   Liver Function Tests: Recent Labs  Lab 08/06/19 1211 08/06/19 1211 08/07/19 1927 08/07/19 1927 08/08/19 0432 08/09/19 0414 08/10/19 0244 08/11/19 0547 08/12/19 0539  AST 15  --  15  --   --   --   --   --   --   ALT 25  --  22  --   --   --   --   --   --   ALKPHOS 59  --  54  --   --   --   --   --   --   BILITOT 0.6  --  0.5  --   --   --   --   --   --   PROT 7.7  --  6.2*  --   --   --   --   --   --   ALBUMIN 3.0*    < > 2.2*   < > 2.2* 2.1* 2.0* 2.5* 2.4*   < > = values in this interval not displayed.   No results for input(s): LIPASE, AMYLASE in the last 168 hours. No results for input(s): AMMONIA in the last 168 hours. CBC: Recent Labs  Lab 08/07/19 0527 08/07/19 1927 08/08/19 0432 08/09/19 0414 08/10/19 0244 08/11/19 0547 08/12/19 0539  WBC 20.1*   < > 13.7* 19.3* 14.0* 13.3* 15.3*  NEUTROABS 17.5*  --  11.4* 17.3*  --   --   --   HGB 10.3*   < > 8.7* 9.0* 8.2* 8.5* 8.5*  HCT 33.8*   < > 28.5* 29.6* 26.3* 28.5* 27.8*  MCV 89.7   < > 89.1 89.7 88.9 92.8 91.7  PLT 333   < > 255 308 299 268 278   < > = values in this interval not displayed.   Cardiac Enzymes: No results for input(s): CKTOTAL, CKMB, CKMBINDEX, TROPONINI in the last 168 hours. BNP: Invalid input(s): POCBNP CBG: Recent Labs  Lab 08/10/19 2142 08/11/19 0735 08/11/19 1138 08/12/19 0749 08/12/19 1205  GLUCAP 106* 104* 169* 142* 213*   D-Dimer No results for input(s): DDIMER in the last 72 hours. Hgb A1c No results for input(s): HGBA1C in the last 72 hours. Lipid Profile No results for input(s): CHOL, HDL, LDLCALC, TRIG, CHOLHDL, LDLDIRECT in the last 72 hours. Thyroid function studies No results for input(s): TSH, T4TOTAL, T3FREE, THYROIDAB in the last 72 hours.  Invalid input(s):  FREET3 Anemia work up No results for input(s): VITAMINB12, FOLATE, FERRITIN, TIBC, IRON, RETICCTPCT in the last 72 hours. Urinalysis No results found for: COLORURINE, APPEARANCEUR, [place], [place], [place], [place], [place], [place], PROTEINUR, UROBILINOGEN, NITRITE, LEUKOCYTESUR Sepsis Labs Invalid input(s): PROCALCITONIN,  WBC,  LACTICIDVEN Microbiology Recent Results (from the past 240 hour(s))  Culture, blood (single) w Reflex to ID Panel     Status: None   Collection Time: 08/06/19  1:50 PM   Specimen: Left Antecubital; Blood  Result Value Ref Range Status   Specimen Description LEFT ANTECUBITAL  Final   Special Requests    Final    BOTTLES DRAWN AEROBIC AND ANAEROBIC Blood Culture adequate volume   Culture   Final    NO GROWTH 5 DAYS Performed at [facility], [address], [place], [place] [address]    Report Status 08/11/2019 FINAL  Final  SARS CORONAVIRUS 2 (TAT 6-24 HRS) Nasopharyngeal Nasopharyngeal Swab     Status: None   Collection Time: 08/06/19  2:28 PM   Specimen: Nasopharyngeal Swab  Result Value Ref Range Status   SARS Coronavirus 2 NEGATIVE NEGATIVE Final    Comment: (NOTE) SARS-CoV-2 target nucleic acids are NOT DETECTED. The SARS-CoV-2 RNA is generally detectable in upper and lower respiratory specimens during the acute phase of infection. Negative results do not preclude SARS-CoV-2 infection, do not rule out co-infections with other pathogens, and should not be used as the sole basis for treatment or other patient management decisions. Negative results must be combined with clinical observations, patient history, and epidemiological information. The expected result is Negative. Fact Sheet for Patients: SugarRoll.be Fact Sheet for Healthcare Providers: https://www.woods-mathews.com/ This test is not yet approved or cleared by the [place] FDA and  has been authorized for detection and/or diagnosis of SARS-CoV-2 by FDA under an Emergency Use Authorization (EUA). This EUA will remain  in effect (meaning this test can be used) for the duration of the COVID-19 declaration under Section 56 4(b)(1) of the Act, 21 U.S.C. section 360bbb-3(b)(1), unless the authorization is terminated or revoked sooner. Performed at [facility], [place] [address] [place], [place] [address]   Culture, respiratory     Status: None   Collection Time: 08/08/19  2:06 PM   Specimen: Bronchial Washing, Right; Respiratory  Result Value Ref Range Status   Specimen Description BRONCHIAL WASHINGS RIGHT  Final   Special Requests NONE  Final   Gram Stain   Final     FEW WBC PRESENT,BOTH PMN AND MONONUCLEAR NO ORGANISMS SEEN    Culture   Final    NO GROWTH Performed at [facility], [address], [place], [place] [address]    Report Status 08/10/2019 FINAL  Final  Acid Fast Smear (AFB)     Status: None   Collection Time: 08/08/19  2:06 PM   Specimen: Bronchial Washing, Right; Respiratory  Result Value Ref Range Status   AFB Specimen Processing Comment  Final    Comment: Tissue Grinding and Digestion/Decontamination   Acid Fast Smear Negative  Final    Comment: (NOTE) Performed At: [facility] [place], [place] [id number] Rush Farmer MD [id number]    Source (AFB) BRONCHIAL WASHINGS  Final    Comment: RIGHT Performed at [facility], [place] [address], [place], [place] [address]   Body fluid culture     Status: None   Collection Time: 08/08/19  2:17 PM   Specimen: PATH Cytology Pleural fluid; Body Fluid  Result  Value Ref Range Status   Specimen Description FLUID RIGHT PLEURAL  Final   Special Requests NONE  Final   Gram Stain   Final    ABUNDANT WBC PRESENT, PREDOMINANTLY MONONUCLEAR NO ORGANISMS SEEN    Culture   Final    NO GROWTH 3 DAYS Performed at [facility], [place] [address], [place], [place] [address]    Report Status 08/11/2019 FINAL  Final     Time coordinating discharge: 35 minutes  SIGNED:   Cordelia Poche, MD [facility] 08/12/2019, 3:48 PM

## 2019-08-12 NOTE — Progress Notes (Signed)
   08/12/19 1303  Vitals  Temp 97.9 F (36.6 C)  Temp Source Oral  BP (!) 96/45  MAP (mmHg) (!) 60  BP Location Right Arm  BP Method Automatic  Patient Position (if appropriate) Lying  Pulse Rate 76  Resp (!) 22  Oxygen Therapy  SpO2 93 %  O2 Device Room Air  MEWS Score  MEWS Temp 0  MEWS Systolic 1  MEWS Pulse 0  MEWS RR 1  MEWS LOC 0  MEWS Score 2  MEWS Score Color Yellow  MEWS Assessment  Is this an acute change? Yes  MEWS guidelines implemented *See Row Information* Yellow  Provider Notification  Provider Name/Title Cordelia Poche, MD  Date Provider Notified 08/12/19  Time Provider Notified 1310  Notification Type Page  Notification Reason Change in status  Response Other (Comment) (awaiting orders)   Patient is lying in bed comfortable and is in no acute distress. Will implement Yellow MEWS protocol and MD was paged.

## 2019-08-12 NOTE — Therapy (Signed)
[facility] Therapy Treatment Record Phone [phone]   Radiation Therapy was administered to [name] on: 08/12/2019  3:26 PM and was treatment # 2 out of a planned course of 14 treatments.  Radiation Treatment  1). Beam photons with 6-10 energy  2). Brachytherapy None  3). Stereotactic Radiosurgery None  4). Other Radiation None     Latina Craver, RT (T)

## 2019-08-12 NOTE — TOC Transition Note (Signed)
Transition of Care [facility]) - CM/SW Discharge Note   Patient Details  Name: Jeremy [name] MRN: [id number] Date of Birth: March 22, 1946  [facility] CM/SW Contact:  Lynnell Catalan, RN Phone Number: 08/12/2019, 4:06 PM   Clinical Narrative:    Pt qualifies for home 02 per desat screen. O2 order received. [employer] contacted for home 02 need.

## 2019-08-12 NOTE — Evaluation (Signed)
Occupational Therapy Evaluation Patient Details Name: Jeremy [name] MRN: [id number] DOB: [birth year] Today's Date: 08/12/2019    History of Present Illness 74 yo male admitted with hemoptysis, new diagnosis of lung ca, pna. Hx of copd, psoriatic arthritis   Clinical Impression   Pt and wife overall feel pt at baseline - min A with ADL activity     Follow Up Recommendations  No OT follow up;Supervision/Assistance - 24 hour    Equipment Recommendations  None recommended by OT    Recommendations for Other Services       Precautions / Restrictions Precautions Precautions: Fall      Mobility Bed Mobility Overal bed mobility: Independent                Transfers Overall transfer level: Independent                    Balance Overall balance assessment: No apparent balance deficits (not formally assessed)                                         ADL either performed or assessed with clinical judgement   ADL Overall ADL's : At baseline                                             Vision Baseline Vision/History: Cataracts Patient Visual Report: No change from baseline              Pertinent Vitals/Pain Pain Assessment: No/denies pain     Hand Dominance     Extremity/Trunk Assessment         Cervical / Trunk Assessment Cervical / Trunk Assessment: Normal   Communication Communication Communication: No difficulties   Cognition Arousal/Alertness: Awake/alert Behavior During Therapy: WFL for tasks assessed/performed Overall Cognitive Status: Within Functional Limits for tasks assessed                                                Home Living Family/patient expects to be discharged to:: Private residence Living Arrangements: Spouse/significant other Available Help at Discharge: Family Type of Home: House Home Access: Stairs to enter Technical brewer of Steps: 2   Home Layout: One  level     Bathroom Shower/Tub: Chief Strategy Officer: None          Prior Functioning/Environment Level of Independence: Independent                          OT Goals(Current goals can be found in the care plan section) Acute Rehab OT Goals Patient Stated Goal: home today OT Goal Formulation: With patient  OT Frequency:      AM-PAC OT "6 Clicks" Daily Activity     Outcome Measure Help from another person eating meals?: None Help from another person taking care of personal grooming?: None Help from another person toileting, which includes using toliet, bedpan, or urinal?: A Little Help from another person bathing (including washing, rinsing, drying)?: A Little Help from another person to put on and taking off regular upper body clothing?: None  Help from another person to put on and taking off regular lower body clothing?: A Little 6 Click Score: 21   End of Session Nurse Communication: Mobility status  Activity Tolerance: Patient tolerated treatment well Patient left: in chair;with call bell/phone within reach;with family/visitor present                   Time: 1206-1217 OT Time Calculation (min): 11 min Charges:  OT General Charges $OT Visit: 1 Visit OT Evaluation $OT Eval Low Complexity: 1 Low  Kari Baars, OT Acute Rehabilitation Services [phone] Office- [phone]     Teaghan Formica, Edwena Felty D 08/12/2019, 2:27 PM

## 2019-08-13 ENCOUNTER — Telehealth: Payer: Self-pay | Admitting: *Deleted

## 2019-08-13 ENCOUNTER — Encounter: Payer: Self-pay | Admitting: *Deleted

## 2019-08-13 ENCOUNTER — Other Ambulatory Visit: Payer: Self-pay

## 2019-08-13 ENCOUNTER — Ambulatory Visit
Admission: RE | Admit: 2019-08-13 | Discharge: 2019-08-13 | Disposition: A | Discharge status: Home / Self Care | Payer: Medicare Other | Source: Ambulatory Visit | Attending: Radiation Oncology | Admitting: Radiation Oncology

## 2019-08-13 DIAGNOSIS — Z510 Encounter for antineoplastic radiation therapy: Secondary | ICD-10-CM | POA: Diagnosis not present

## 2019-08-13 DIAGNOSIS — C3431 Malignant neoplasm of lower lobe, right bronchus or lung: Secondary | ICD-10-CM | POA: Diagnosis not present

## 2019-08-13 DIAGNOSIS — Z87891 Personal history of nicotine dependence: Secondary | ICD-10-CM | POA: Diagnosis not present

## 2019-08-13 LAB — GLUCOSE, CAPILLARY
Glucose-Capillary: 241 mg/dL — ABNORMAL HIGH (ref 70–99)
Glucose-Capillary: 95 mg/dL (ref 70–99)

## 2019-08-13 NOTE — Telephone Encounter (Signed)
Oncology Nurse Navigator Documentation  Oncology Nurse Navigator Flowsheets 08/13/2019  Navigator Location [facility]  Navigator Encounter Type Telephone/I followed to see if Mr. Tantillo was out of the hospital.  He is. I called to schedule with Dr. Julien Nordmann but was unable to reach. I did leave vm message with my name and phone number to call.   Telephone Outgoing Call  Treatment Phase Pre-Tx/Tx Discussion  Barriers/Navigation Needs Coordination of Care;Education  Education Other  Interventions Coordination of Care;Education  Acuity Level 2-Minimal Needs (1-2 Barriers Identified)  Coordination of Care Other  Education Method Verbal  Time Spent with Patient 15

## 2019-08-13 NOTE — Telephone Encounter (Signed)
Oncology Nurse Navigator Documentation  Oncology Nurse Navigator Flowsheets 08/13/2019  Navigator Location [facility]  Navigator Encounter Type Telephone  Telephone Incoming Call;Outgoing Call  Treatment Phase -  Barriers/Navigation Needs Education/I received a vm message from patient's wife.  She had questions about the appt. I called and clarified.    Education Other  Interventions Education  Acuity Level 2-Minimal Needs (1-2 Barriers Identified)  Coordination of Care -  Education Method Verbal  Time Spent with Patient 15

## 2019-08-13 NOTE — Progress Notes (Signed)
Per Dr. Julien Nordmann.  I requested PDL 1 to be test to pathology dept.

## 2019-08-13 NOTE — Telephone Encounter (Signed)
Oncology Nurse Navigator Documentation  Oncology Nurse Navigator Flowsheets 08/13/2019  Navigator Location [facility]  Navigator Encounter Type Telephone/I received a call from patient's wife. I updated her on appt with Dr. Julien Nordmann. She verbalized understanding of appt  Telephone Incoming Call  Treatment Phase -  Barriers/Navigation Needs Coordination of Care;Education  Education Other  Interventions Coordination of Care;Education  Acuity Level 2-Minimal Needs (1-2 Barriers Identified)  Coordination of Care Appts  Education Method Verbal  Time Spent with Patient 15

## 2019-08-14 ENCOUNTER — Ambulatory Visit
Admission: RE | Admit: 2019-08-14 | Discharge: 2019-08-14 | Disposition: A | Discharge status: Home / Self Care | Payer: Medicare Other | Source: Ambulatory Visit | Attending: Radiation Oncology | Admitting: Radiation Oncology

## 2019-08-14 ENCOUNTER — Other Ambulatory Visit: Payer: Self-pay

## 2019-08-14 DIAGNOSIS — Z87891 Personal history of nicotine dependence: Secondary | ICD-10-CM | POA: Diagnosis not present

## 2019-08-14 DIAGNOSIS — Z510 Encounter for antineoplastic radiation therapy: Secondary | ICD-10-CM | POA: Diagnosis not present

## 2019-08-14 DIAGNOSIS — C3431 Malignant neoplasm of lower lobe, right bronchus or lung: Secondary | ICD-10-CM | POA: Diagnosis not present

## 2019-08-15 ENCOUNTER — Ambulatory Visit
Admission: RE | Admit: 2019-08-15 | Discharge: 2019-08-15 | Disposition: A | Discharge status: Home / Self Care | Payer: Medicare Other | Source: Ambulatory Visit | Attending: Radiation Oncology | Admitting: Radiation Oncology

## 2019-08-15 ENCOUNTER — Other Ambulatory Visit: Payer: Self-pay

## 2019-08-15 DIAGNOSIS — C3431 Malignant neoplasm of lower lobe, right bronchus or lung: Secondary | ICD-10-CM | POA: Diagnosis not present

## 2019-08-15 DIAGNOSIS — Z510 Encounter for antineoplastic radiation therapy: Secondary | ICD-10-CM | POA: Diagnosis not present

## 2019-08-18 ENCOUNTER — Ambulatory Visit
Admission: RE | Admit: 2019-08-18 | Discharge: 2019-08-18 | Disposition: A | Discharge status: Home / Self Care | Payer: Medicare Other | Source: Ambulatory Visit | Attending: Radiation Oncology | Admitting: Radiation Oncology

## 2019-08-18 ENCOUNTER — Other Ambulatory Visit: Payer: Self-pay

## 2019-08-18 DIAGNOSIS — C3431 Malignant neoplasm of lower lobe, right bronchus or lung: Secondary | ICD-10-CM | POA: Diagnosis not present

## 2019-08-18 DIAGNOSIS — Z87891 Personal history of nicotine dependence: Secondary | ICD-10-CM | POA: Diagnosis not present

## 2019-08-18 DIAGNOSIS — Z510 Encounter for antineoplastic radiation therapy: Secondary | ICD-10-CM | POA: Diagnosis not present

## 2019-08-19 ENCOUNTER — Other Ambulatory Visit: Payer: Self-pay

## 2019-08-19 ENCOUNTER — Ambulatory Visit
Admission: RE | Admit: 2019-08-19 | Discharge: 2019-08-19 | Disposition: A | Discharge status: Home / Self Care | Payer: Medicare Other | Source: Ambulatory Visit | Attending: Radiation Oncology | Admitting: Radiation Oncology

## 2019-08-19 DIAGNOSIS — Z510 Encounter for antineoplastic radiation therapy: Secondary | ICD-10-CM | POA: Diagnosis not present

## 2019-08-19 DIAGNOSIS — Z87891 Personal history of nicotine dependence: Secondary | ICD-10-CM | POA: Diagnosis not present

## 2019-08-19 DIAGNOSIS — C3431 Malignant neoplasm of lower lobe, right bronchus or lung: Secondary | ICD-10-CM | POA: Diagnosis not present

## 2019-08-20 ENCOUNTER — Ambulatory Visit
Admission: RE | Admit: 2019-08-20 | Discharge: 2019-08-20 | Disposition: A | Discharge status: Home / Self Care | Payer: Medicare Other | Source: Ambulatory Visit | Attending: Radiation Oncology | Admitting: Radiation Oncology

## 2019-08-20 ENCOUNTER — Other Ambulatory Visit: Payer: Self-pay

## 2019-08-20 DIAGNOSIS — Z510 Encounter for antineoplastic radiation therapy: Secondary | ICD-10-CM | POA: Diagnosis not present

## 2019-08-20 DIAGNOSIS — C3431 Malignant neoplasm of lower lobe, right bronchus or lung: Secondary | ICD-10-CM | POA: Diagnosis not present

## 2019-08-20 DIAGNOSIS — Z87891 Personal history of nicotine dependence: Secondary | ICD-10-CM | POA: Diagnosis not present

## 2019-08-21 ENCOUNTER — Other Ambulatory Visit: Payer: Self-pay | Admitting: *Deleted

## 2019-08-21 ENCOUNTER — Ambulatory Visit
Admission: RE | Admit: 2019-08-21 | Discharge: 2019-08-21 | Disposition: A | Discharge status: Home / Self Care | Payer: Medicare Other | Source: Ambulatory Visit | Attending: Radiation Oncology | Admitting: Radiation Oncology

## 2019-08-21 ENCOUNTER — Other Ambulatory Visit: Payer: Self-pay

## 2019-08-21 DIAGNOSIS — C3431 Malignant neoplasm of lower lobe, right bronchus or lung: Secondary | ICD-10-CM | POA: Diagnosis not present

## 2019-08-21 DIAGNOSIS — Z510 Encounter for antineoplastic radiation therapy: Secondary | ICD-10-CM | POA: Diagnosis not present

## 2019-08-21 DIAGNOSIS — Z87891 Personal history of nicotine dependence: Secondary | ICD-10-CM | POA: Diagnosis not present

## 2019-08-21 NOTE — Progress Notes (Signed)
The proposed treatment discussed in cancer conference 08/21/19 is for discussion purpose only and is not a binding recommendation.  The patient was not physically examined nor present for their treatment options.  Therefore, final treatment plans cannot be decided.

## 2019-08-22 ENCOUNTER — Other Ambulatory Visit: Payer: Self-pay

## 2019-08-22 ENCOUNTER — Ambulatory Visit
Admission: RE | Admit: 2019-08-22 | Discharge: 2019-08-22 | Disposition: A | Discharge status: Home / Self Care | Payer: Medicare Other | Source: Ambulatory Visit | Attending: Radiation Oncology | Admitting: Radiation Oncology

## 2019-08-22 DIAGNOSIS — Z510 Encounter for antineoplastic radiation therapy: Secondary | ICD-10-CM | POA: Diagnosis not present

## 2019-08-22 DIAGNOSIS — C3431 Malignant neoplasm of lower lobe, right bronchus or lung: Secondary | ICD-10-CM | POA: Diagnosis not present

## 2019-08-25 ENCOUNTER — Ambulatory Visit
Admission: RE | Admit: 2019-08-25 | Discharge: 2019-08-25 | Disposition: A | Discharge status: Home / Self Care | Payer: Medicare Other | Source: Ambulatory Visit | Attending: Radiation Oncology | Admitting: Radiation Oncology

## 2019-08-25 ENCOUNTER — Other Ambulatory Visit: Payer: Self-pay

## 2019-08-25 DIAGNOSIS — C3431 Malignant neoplasm of lower lobe, right bronchus or lung: Secondary | ICD-10-CM | POA: Diagnosis not present

## 2019-08-25 DIAGNOSIS — Z510 Encounter for antineoplastic radiation therapy: Secondary | ICD-10-CM | POA: Diagnosis not present

## 2019-08-25 DIAGNOSIS — Z87891 Personal history of nicotine dependence: Secondary | ICD-10-CM | POA: Diagnosis not present

## 2019-08-26 ENCOUNTER — Other Ambulatory Visit: Payer: Self-pay

## 2019-08-26 ENCOUNTER — Encounter (HOSPITAL_COMMUNITY): Payer: Self-pay

## 2019-08-26 ENCOUNTER — Inpatient Hospital Stay: Payer: Medicare Other | Attending: Internal Medicine | Admitting: Internal Medicine

## 2019-08-26 ENCOUNTER — Encounter: Payer: Self-pay | Admitting: Internal Medicine

## 2019-08-26 ENCOUNTER — Ambulatory Visit
Admission: RE | Admit: 2019-08-26 | Discharge: 2019-08-26 | Disposition: A | Discharge status: Home / Self Care | Payer: Medicare Other | Source: Ambulatory Visit | Attending: Radiation Oncology | Admitting: Radiation Oncology

## 2019-08-26 VITALS — BP 109/72 | HR 95 | Temp 98.5°F | Resp 18 | Ht 75.0 in | Wt 164.3 lb

## 2019-08-26 DIAGNOSIS — C3490 Malignant neoplasm of unspecified part of unspecified bronchus or lung: Secondary | ICD-10-CM

## 2019-08-26 DIAGNOSIS — Z79899 Other long term (current) drug therapy: Secondary | ICD-10-CM | POA: Diagnosis not present

## 2019-08-26 DIAGNOSIS — I480 Paroxysmal atrial fibrillation: Secondary | ICD-10-CM | POA: Diagnosis not present

## 2019-08-26 DIAGNOSIS — R5382 Chronic fatigue, unspecified: Secondary | ICD-10-CM

## 2019-08-26 DIAGNOSIS — C3431 Malignant neoplasm of lower lobe, right bronchus or lung: Secondary | ICD-10-CM | POA: Insufficient documentation

## 2019-08-26 DIAGNOSIS — I10 Essential (primary) hypertension: Secondary | ICD-10-CM | POA: Diagnosis not present

## 2019-08-26 DIAGNOSIS — I2510 Atherosclerotic heart disease of native coronary artery without angina pectoris: Secondary | ICD-10-CM | POA: Insufficient documentation

## 2019-08-26 DIAGNOSIS — Z510 Encounter for antineoplastic radiation therapy: Secondary | ICD-10-CM | POA: Diagnosis not present

## 2019-08-26 DIAGNOSIS — Z923 Personal history of irradiation: Secondary | ICD-10-CM | POA: Diagnosis not present

## 2019-08-26 DIAGNOSIS — E1136 Type 2 diabetes mellitus with diabetic cataract: Secondary | ICD-10-CM | POA: Diagnosis not present

## 2019-08-26 DIAGNOSIS — C3491 Malignant neoplasm of unspecified part of right bronchus or lung: Secondary | ICD-10-CM

## 2019-08-26 DIAGNOSIS — R5383 Other fatigue: Secondary | ICD-10-CM | POA: Insufficient documentation

## 2019-08-26 DIAGNOSIS — Z5111 Encounter for antineoplastic chemotherapy: Secondary | ICD-10-CM | POA: Insufficient documentation

## 2019-08-26 DIAGNOSIS — Z7689 Persons encountering health services in other specified circumstances: Secondary | ICD-10-CM | POA: Insufficient documentation

## 2019-08-26 DIAGNOSIS — Z87891 Personal history of nicotine dependence: Secondary | ICD-10-CM | POA: Insufficient documentation

## 2019-08-26 DIAGNOSIS — J449 Chronic obstructive pulmonary disease, unspecified: Secondary | ICD-10-CM | POA: Diagnosis not present

## 2019-08-26 DIAGNOSIS — Z5112 Encounter for antineoplastic immunotherapy: Secondary | ICD-10-CM | POA: Insufficient documentation

## 2019-08-26 DIAGNOSIS — Z7189 Other specified counseling: Secondary | ICD-10-CM | POA: Diagnosis not present

## 2019-08-26 DIAGNOSIS — Z7951 Long term (current) use of inhaled steroids: Secondary | ICD-10-CM | POA: Diagnosis not present

## 2019-08-26 DIAGNOSIS — Z7952 Long term (current) use of systemic steroids: Secondary | ICD-10-CM | POA: Insufficient documentation

## 2019-08-26 MED ORDER — LIDOCAINE-PRILOCAINE 2.5-2.5 % EX CREA: TOPICAL_CREAM | CUTANEOUS | 0 refills | Status: DC

## 2019-08-26 MED ORDER — PROCHLORPERAZINE MALEATE 10 MG PO TABS: 10.0000 mg | ORAL_TABLET | Freq: Four times a day (QID) | ORAL | 0 refills | Status: DC | PRN

## 2019-08-26 NOTE — Progress Notes (Signed)
START ON PATHWAY REGIMEN - Non-Small Cell Lung     A cycle is every 21 days:     Pembrolizumab      Paclitaxel      Carboplatin   **Always confirm dose/schedule in your pharmacy ordering system**  Patient Characteristics: Stage IV Metastatic, Squamous, PS = 0, 1, First Line, PD-L1 Expression Positive 1-49% (TPS) / Negative / Not Tested / Awaiting Test Results and Immunotherapy Candidate Therapeutic Status: Stage IV Metastatic Histology: Squamous Cell Line of therapy: First Line ECOG Performance Status: 1 PD-L1 Expression Status: PD-L1 Positive 1-49% (TPS) Immunotherapy Candidate Status: Candidate for Immunotherapy Intent of Therapy: Non-Curative / Palliative Intent, Discussed with Patient 

## 2019-08-26 NOTE — Progress Notes (Signed)
[place] [phone]   [phone]  OFFICE PROGRESS NOTE  [name], MD [address] 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  PD-L1 expression 20%.  PRIOR THERAPY: Palliative radiotherapy under the care of Dr. Sondra Come to the obstructive right lower lobe lung mass.  CURRENT THERAPY: Systemic chemotherapy with carboplatin for AUC of 5, paclitaxel 175 mg/M2 and Keytruda 200 mg IV every 3 weeks with Neulasta support.  First dose September 02, 2019.  INTERVAL HISTORY: Cougar Imel 74 y.o. male returns to the clinic today for hospital follow-up visit accompanied by his wife.  The patient is feeling much better today with less chest pain and shortness of breath.  He has mild cough with no hemoptysis.  He denied having any current fever or chills.  He has no nausea, vomiting, diarrhea or constipation.  He denied having any headache or visual changes.  He lost several pounds during his hospitalization.  The patient completed a course of palliative radiotherapy to the obstructive right lower lobe lung mass under the care of Dr. Sondra Come.  The patient is here today for evaluation and discussion of his treatment options.  MEDICAL HISTORY: Past Medical History:  Diagnosis Date  . COPD (chronic obstructive pulmonary disease) (HCC)    QUIT SMOKING 30 YRS AGO  . Diabetes ([place]) 2018  . HTN (hypertension)   . Psoriatic arthritis ([place]) 2015    ALLERGIES:  is allergic to aleve [naproxen sodium].  MEDICATIONS:  Current Outpatient Medications  Medication Sig Dispense Refill  . acetaminophen (TYLENOL) 500 MG tablet Take 500 mg by mouth every 4 (four) hours as needed (for pain.).    Marland Kitchen albuterol (VENTOLIN HFA) 108 (90 Base) MCG/ACT inhaler Inhale 2 puffs into the lungs every 6 (six) hours as needed for wheezing or shortness of breath.    .  Ascorbic Acid (VITAMIN C) 1000 MG tablet Take 1,000 mg by mouth daily.    . cyclobenzaprine (FLEXERIL) 10 MG tablet Take 10 mg by mouth at bedtime.    Marland Kitchen doxycycline (MONODOX) 50 MG capsule Take 50 mg by mouth 2 (two) times daily as needed (rosacea flare ups.).    Marland Kitchen ferrous sulfate 325 (65 FE) MG tablet Take 325 mg by mouth daily with supper.    Marland Kitchen FLOVENT HFA 110 MCG/ACT inhaler 1 puff 2 (two) times daily.    . folic acid (FOLVITE) 1 MG tablet Take 1 mg by mouth daily.    Marland Kitchen gabapentin (NEURONTIN) 300 MG capsule Take 600 mg by mouth at bedtime.    . metFORMIN (GLUCOPHAGE-XR) 500 MG 24 hr tablet Take 500-1,000 mg by mouth See admin instructions. Take 2 tablets (1000 mg) by mouth in the morning & 1 tablet (500 mg) by mouth at night.    . methotrexate 50 MG/2ML injection Inject 17.5 mg into the muscle every Friday.     . metroNIDAZOLE (METROCREAM) 0.75 % cream Apply 1 application topically 2 (two) times daily as needed (facial rosacea).     . mometasone (ELOCON) 0.1 % cream Apply 1 application topically 2 (two) times daily as needed (psoriasis).     . Omega-3 Fatty Acids (FISH OIL) 1000 MG CAPS Take 1,000 mg by mouth daily.     . pantoprazole (PROTONIX) 40 MG tablet 1 po 30 mins prior to first meal 30 tablet 11  . predniSONE (DELTASONE) 5 MG  tablet Take 5 mg by mouth daily with breakfast.    . traMADol (ULTRAM) 50 MG tablet Take 50 mg by mouth every 4 (four) hours as needed. for pain    . umeclidinium-vilanterol (ANORO ELLIPTA) 62.5-25 MCG/INH AEPB Inhale 1 puff into the lungs daily. 30 each 0   No current facility-administered medications for this visit.    SURGICAL HISTORY:  Past Surgical History:  Procedure Laterality Date  . BACK SURGERY  1990  . BIOPSY  12/30/2018   Procedure: BIOPSY;  Surgeon: Danie Binder, MD;  [facility];  Service: Endoscopy;;  gastric  . CATARACT EXTRACTION Bilateral   . CERVICAL SPINE SURGERY  1995  . COLONOSCOPY WITH PROPOFOL N/A 12/30/2018    Procedure: COLONOSCOPY WITH PROPOFOL;  Surgeon: Danie Binder, MD;  Location: [facility];  Service: Endoscopy;  Laterality: N/A;  12:30pm  . ESOPHAGOGASTRODUODENOSCOPY (EGD) WITH PROPOFOL N/A 12/30/2018   Procedure: ESOPHAGOGASTRODUODENOSCOPY (EGD) WITH PROPOFOL;  Surgeon: Danie Binder, MD;  [facility];  Service: Endoscopy;  Laterality: N/A;  . POLYPECTOMY  12/30/2018   Procedure: POLYPECTOMY;  Surgeon: Danie Binder, MD;  [facility];  Service: Endoscopy;;  colon  . THORACENTESIS Right 08/08/2019   Procedure: Thoracentesis;  Surgeon: [name], MD;  Location: [place];  Service: Pulmonary;  Laterality: Right;  Marland Kitchen VIDEO BRONCHOSCOPY Right 08/08/2019   Procedure: Video Bronchoscopy with Erbe Cryo Biopsy of Right mainstem;  Surgeon: [name], MD;  Location: [facility] OR;  Service: Pulmonary;  Laterality: Right;    REVIEW OF SYSTEMS:  Constitutional: positive for fatigue and weight loss Eyes: negative Ears, nose, mouth, throat, and face: negative Respiratory: positive for dyspnea on exertion Cardiovascular: negative Gastrointestinal: negative Genitourinary:negative Integument/breast: negative Hematologic/lymphatic: negative Musculoskeletal:negative Neurological: negative Behavioral/Psych: negative Endocrine: negative Allergic/Immunologic: negative   PHYSICAL EXAMINATION: General appearance: alert, cooperative, fatigued and no distress Head: Normocephalic, without obvious abnormality, atraumatic Neck: no adenopathy, no JVD, supple, symmetrical, trachea midline and thyroid not enlarged, symmetric, no tenderness/mass/nodules Lymph nodes: Cervical, supraclavicular, and axillary nodes normal. Resp: diminished breath sounds RLL and dullness to percussion RLL Back: symmetric, no curvature. ROM normal. No CVA tenderness. Cardio: regular rate and rhythm, S1, S2 normal, no murmur, click, rub or gallop GI: soft, non-tender; bowel sounds normal; no masses,  no  organomegaly Extremities: extremities normal, atraumatic, no cyanosis or edema Neurologic: Alert and oriented X 3, normal strength and tone. Normal symmetric reflexes. Normal coordination and gait  ECOG PERFORMANCE STATUS: 1 - Symptomatic but completely ambulatory  Blood pressure 109/72, pulse 95, temperature 98.5 F (36.9 C), temperature source Temporal, resp. rate 18, height 6' 3" (1.905 m), weight 164 lb 4.8 oz (74.5 kg), SpO2 97 %.  LABORATORY DATA: Lab Results  Component Value Date   WBC 15.3 (H) 08/12/2019   HGB 8.5 (L) 08/12/2019   HCT 27.8 (L) 08/12/2019   MCV 91.7 08/12/2019   PLT 278 08/12/2019      Chemistry      Component Value Date/Time   NA 132 (L) 08/12/2019 0539   K 4.0 08/12/2019 0539   CL 96 (L) 08/12/2019 0539   CO2 26 08/12/2019 0539   BUN 12 08/12/2019 0539   CREATININE 0.74 08/12/2019 0539      Component Value Date/Time   CALCIUM 8.3 (L) 08/12/2019 0539   ALKPHOS 54 08/07/2019 1927   AST 15 08/07/2019 1927   ALT 22 08/07/2019 1927   BILITOT 0.5 08/07/2019 1927  RADIOGRAPHIC STUDIES: CT ANGIO CHEST PE W OR WO CONTRAST  Result Date: 08/06/2019 CLINICAL DATA:  Persistent shortness of breath, hemoptysis, hypoxia. EXAM: CT ANGIOGRAPHY CHEST WITH CONTRAST TECHNIQUE: Multidetector CT imaging of the chest was performed using the standard protocol during bolus administration of intravenous contrast. Multiplanar CT image reconstructions and MIPs were obtained to evaluate the vascular anatomy. CONTRAST:  143m OMNIPAQUE IOHEXOL 350 MG/ML SOLN COMPARISON:  Chest radiograph 08/06/2019. FINDINGS: Cardiovascular: Negative for pulmonary embolus. Atherosclerotic calcification of the aorta and coronary arteries. Heart size normal. No pericardial effusion. Mediastinum/Nodes: Mediastinal lymph nodes measure up to 8 mm in the high right paratracheal station. Probable 1.5 cm subcarinal lymph node. 10 mm right hilar lymph node. No left hilar or axillary lymph nodes.  Prepericardiac lymph nodes are not enlarged by CT size criteria. Esophagus is grossly unremarkable. Lungs/Pleura: A cavitary mass in the right lower lobe extends 2 and involves the carina and measures approximately 6.5 x 9.9 cm. There is an air-fluid level within. Complete obstruction of the right lower lobe with collapse/consolidation and necrosis in the right lower lobe. Near complete obstruction of the right upper and middle lobe bronchi with extensive perilymphatic nodularity and mild septal thickening in the aerated right upper and middle lobes. Moderate right pleural effusion. Mild centrilobular emphysema. Left lung is clear. Debris is seen in the proximal left mainstem bronchus, in addition to the right bronchial tree, as discussed previously. Upper Abdomen: Visualized portions of the liver, adrenal glands, left kidney, spleen, pancreas, stomach and bowel are grossly unremarkable. No upper abdominal adenopathy. Musculoskeletal: Degenerative changes in the spine. Review of the MIP images confirms the above findings. IMPRESSION: 1. Negative for pulmonary embolus. 2. Large necrotic mass extends to the carina with associated obstruction of the right lower lobe bronchus. Postobstructive collapse/consolidation and necrosis in the right lower lobe. 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 3. 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 4. Borderline right paratracheal and subcarinal lymph nodes, likely metastatic. 5. Aortic atherosclerosis (ICD10-I70.0). Coronary artery calcification. Electronically Signed   By: MLorin PicketM.D.   On: 08/06/2019 13:40   MR BRAIN W WO CONTRAST  Result Date: 08/10/2019 CLINICAL DATA:  Lung cancer, staging EXAM: MRI HEAD WITHOUT AND WITH CONTRAST TECHNIQUE: Multiplanar, multiecho pulse sequences of the brain and surrounding structures were  obtained without and with intravenous contrast. CONTRAST:  850mGADAVIST GADOBUTROL 1 MMOL/ML IV SOLN COMPARISON:  None. FINDINGS: Brain: There is no acute infarction or intracranial hemorrhage. There is no intracranial mass, mass effect, or edema. There is no hydrocephalus or extra-axial fluid collection. Prominence of the ventricles and sulci reflects mild generalized parenchymal volume loss. Patchy foci of T2 hyperintensity in the supratentorial and pontine white matter are nonspecific but may reflect mild chronic microvascular ischemic changes. Incidental note is made of a small cerebellar developmental venous anomaly. Vascular: Major vessel flow voids at the skull base are preserved. Skull and upper cervical spine: Normal marrow signal is preserved. Sinuses/Orbits: Mild mucosal thickening. Bilateral lens replacements. Other: Sella is unremarkable.  Mastoid air cells are clear. IMPRESSION: No evidence of intracranial metastatic disease. Mild chronic microvascular ischemic changes. Electronically Signed   By: PrMacy Mis.D.   On: 08/10/2019 12:52   CT ABDOMEN PELVIS W CONTRAST  Result Date: 08/11/2019 CLINICAL DATA:  Lung cancer assess for metastatic disease EXAM: CT ABDOMEN AND PELVIS WITH CONTRAST TECHNIQUE: Multidetector CT imaging  of the abdomen and pelvis was performed using the standard protocol following bolus administration of intravenous contrast. CONTRAST:  171m OMNIPAQUE IOHEXOL 300 MG/ML  SOLN COMPARISON:  CT chest 08/06/2019, MRI 06/03/2019, 08/10/2019 FINDINGS: Lower chest: Small right-sided pleural effusion with volume loss in the right thorax and shift of mediastinal contents to the right. Incompletely visualized necrotic lung mass in the right lower lobe. Normal heart size. Trace pericardial effusion. Subcentimeter right cardio phrenic lymph nodes. Hepatobiliary: No focal hepatic abnormality. No calcified gallstone or biliary dilatation. Pancreas: Unremarkable. No pancreatic ductal  dilatation or surrounding inflammatory changes. Spleen: Normal in size without focal abnormality. Adrenals/Urinary Tract: Right adrenal gland is normal. Nodular thickening of left adrenal gland without discrete mass. The kidneys show no hydronephrosis. Subcentimeter hypodensity in the mid left kidney, too small to further characterize. The bladder is normal. Stomach/Bowel: Stomach is within normal limits. Appendix appears normal. No evidence of bowel wall thickening, distention, or inflammatory changes. Scattered diverticula in the sigmoid colon without acute inflammatory change Vascular/Lymphatic: Moderate aortic atherosclerosis. Tortuous aorta with focally ectatic infrarenal aorta up to 2.8 cm. No significant adenopathy. Reproductive: Prostate is unremarkable. Other: Negative for free fluid or free air. Small fat containing right inguinal hernia. Musculoskeletal: No acute or suspicious osseous abnormality. Degenerative changes most advanced at L4-L5. IMPRESSION: 1. Small right-sided pleural effusion with incompletely visualized necrotic lung mass in the right lower lobe. Volume loss on the right with shift of mediastinal contents to the right. 2. Otherwise negative CT examination of the abdomen and pelvis. No definitive evidence for metastatic disease Electronically Signed   By: KDonavan FoilM.D.   On: 08/11/2019 19:52   DG Chest Port 1 View  Result Date: 08/06/2019 CLINICAL DATA:  Shortness of breath EXAM: PORTABLE CHEST 1 VIEW COMPARISON:  06/20/2019 FINDINGS: Volume loss on the right. Airspace disease throughout the right lung concerning for pneumonia. Small right pleural effusion. Left lung clear. Heart is normal size. No acute bony abnormality. IMPRESSION: Airspace disease throughout the right lung with small right effusion. Findings concerning for pneumonia. Volume loss on the right. Electronically Signed   By: KRolm BaptiseM.D.   On: 08/06/2019 12:26    ASSESSMENT AND PLAN: This is a very pleasant 74 years old white male with stage IIIc/IV (T4, N2, M0/M1 a) non-small cell lung cancer, squamous cell carcinoma presented with large necrotic mass extending to the carina with associated obstruction of the right lower lobe bronchus and postobstructive collapse/consolidation and necrosis in the right lower lobe as well as mediastinal lymphadenopathy with 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   The patient has PD-L1 expression of 20%. The patient completed a course of palliative radiotherapy to the obstructive right lower lobe lung mass under the care of Dr. KSondra Come  He is feeling a little bit better with less shortness of breath and chest pain. I had a lengthy discussion with the patient and his wife today about his current disease stage, prognosis and treatment options. I personally and independently reviewed the scan images and discussed the result and showed the images to the patient and his wife. I recommended for the patient to complete the staging work-up by ordering a PET scan to rule out any other metastatic disease. I discussed with the patient his treatment options including palliative care and hospice referral versus palliative systemic chemotherapy with carboplatin for AUC of 5, paclitaxel 175 mg/M2 and Keytruda 200 mg IV every 3 weeks with Neulasta support. The patient is interested in  proceeding with systemic chemotherapy.  I discussed with him the adverse effect of this treatment including but not limited to alopecia, myelosuppression, nausea and vomiting, peripheral neuropathy, liver or renal dysfunction in addition to the immunotherapy adverse effects. He is expected to start the first cycle of this treatment on September 02, 2019. I will arrange for the patient to have a chemotherapy education class before the first dose of his treatment. I will refer the patient to interventional radiology for Port-A-Cath placement.  I will send a prescription for Compazine as well  as EMLA cream to his pharmacy. The patient will come back for follow-up visit in 2 weeks for evaluation and management of any adverse effect of his treatment. He was advised to call immediately if he has any other concerning symptoms in the interval. The patient voices understanding of current disease status and treatment options and is in agreement with the current care plan.  All questions were answered. The patient knows to call the clinic with any problems, questions or concerns. We can certainly see the patient much sooner if necessary.  The total time spent in the appointment was 50 minutes.  Disclaimer: This note was dictated with voice recognition software. Similar sounding words can inadvertently be transcribed and may not be corrected upon review.

## 2019-08-27 ENCOUNTER — Other Ambulatory Visit: Payer: Self-pay

## 2019-08-27 ENCOUNTER — Ambulatory Visit
Admission: RE | Admit: 2019-08-27 | Discharge: 2019-08-27 | Disposition: A | Discharge status: Home / Self Care | Payer: Medicare Other | Source: Ambulatory Visit | Attending: Radiation Oncology | Admitting: Radiation Oncology

## 2019-08-27 ENCOUNTER — Telehealth: Payer: Self-pay | Admitting: Internal Medicine

## 2019-08-27 DIAGNOSIS — Z23 Encounter for immunization: Secondary | ICD-10-CM | POA: Diagnosis not present

## 2019-08-27 DIAGNOSIS — Z87891 Personal history of nicotine dependence: Secondary | ICD-10-CM | POA: Diagnosis not present

## 2019-08-27 DIAGNOSIS — Z510 Encounter for antineoplastic radiation therapy: Secondary | ICD-10-CM | POA: Diagnosis not present

## 2019-08-27 DIAGNOSIS — C3431 Malignant neoplasm of lower lobe, right bronchus or lung: Secondary | ICD-10-CM | POA: Diagnosis not present

## 2019-08-27 NOTE — Telephone Encounter (Signed)
Scheduled per los. Gave avs and calendar  

## 2019-08-28 ENCOUNTER — Inpatient Hospital Stay: Payer: Medicare Other

## 2019-08-28 ENCOUNTER — Other Ambulatory Visit: Payer: Self-pay

## 2019-08-28 ENCOUNTER — Encounter: Payer: Self-pay | Admitting: Radiation Oncology

## 2019-08-28 ENCOUNTER — Ambulatory Visit
Admission: RE | Admit: 2019-08-28 | Discharge: 2019-08-28 | Disposition: A | Discharge status: Home / Self Care | Payer: Medicare Other | Source: Ambulatory Visit | Attending: Radiation Oncology | Admitting: Radiation Oncology

## 2019-08-28 DIAGNOSIS — Z87891 Personal history of nicotine dependence: Secondary | ICD-10-CM | POA: Diagnosis not present

## 2019-08-28 DIAGNOSIS — C3431 Malignant neoplasm of lower lobe, right bronchus or lung: Secondary | ICD-10-CM | POA: Diagnosis not present

## 2019-08-28 DIAGNOSIS — Z510 Encounter for antineoplastic radiation therapy: Secondary | ICD-10-CM | POA: Diagnosis not present

## 2019-08-28 NOTE — Progress Notes (Signed)
Pharmacist Chemotherapy Monitoring - Initial Assessment    Anticipated start date: 09/01/19  Regimen:  . Are orders appropriate based on the patient's diagnosis, regimen, and cycle? Yes . Does the plan date match the patient's scheduled date? Yes . Is the sequencing of drugs appropriate? Yes . Are the premedications appropriate for the patient's regimen? Yes . Prior Authorization for treatment is: Approved o If applicable, is the correct biosimilar selected based on the patient's insurance? not applicable  Organ Function and Labs: Marland Kitchen Are dose adjustments needed based on the patient's renal function, hepatic function, or hematologic function? Yes . Are appropriate labs ordered prior to the start of patient's treatment? Yes . Other organ system assessment, if indicated: N/A The following baseline labs, if indicated, have been ordered: pembrolizumab: baseline TSH +/- T4 Dose Assessment: . Are the drug doses appropriate? Yes . Are the following correct: o Drug concentrations Yes o IV fluid compatible with drug Yes o Administration routes Yes o Timing of therapy Yes . If applicable, does the patient have documented access for treatment and/or plans for port-a-cath placement? yes . If applicable, have lifetime cumulative doses been properly documented and assessed? n/a  Toxicity Monitoring/Prevention: . The patient has the following take home antiemetics prescribed: Prochlorperazine . The patient has the following take home medications prescribed: N/A . Medication allergies and previous infusion related reactions, if applicable, have been reviewed and addressed. Yes . The patient's current medication list has been assessed for drug-drug interactions with their chemotherapy regimen. no significant drug-drug interactions were identified on review.  Order Review: . Are the treatment plan orders signed? Yes . Is the patient scheduled to see a provider prior to their treatment? No  I verify  that I have reviewed each item in the above checklist and answered each question accordingly.   Kennith Center, Pharm.D., CPP 08/28/2019@4 :19 PM

## 2019-09-01 ENCOUNTER — Encounter: Payer: Self-pay | Admitting: Internal Medicine

## 2019-09-01 ENCOUNTER — Inpatient Hospital Stay: Payer: Medicare Other

## 2019-09-01 ENCOUNTER — Other Ambulatory Visit: Payer: Self-pay

## 2019-09-01 ENCOUNTER — Encounter (HOSPITAL_COMMUNITY): Payer: Self-pay

## 2019-09-01 ENCOUNTER — Emergency Department (HOSPITAL_COMMUNITY)
Admission: EM | Admit: 2019-09-01 | Discharge: 2019-09-01 | Disposition: A | Discharge status: Home / Self Care | Payer: Medicare Other | Source: Home / Self Care | Attending: Emergency Medicine | Admitting: Emergency Medicine

## 2019-09-01 VITALS — BP 134/81 | HR 80 | Temp 97.6°F | Resp 18

## 2019-09-01 DIAGNOSIS — R0689 Other abnormalities of breathing: Secondary | ICD-10-CM | POA: Diagnosis not present

## 2019-09-01 DIAGNOSIS — Z79899 Other long term (current) drug therapy: Secondary | ICD-10-CM | POA: Insufficient documentation

## 2019-09-01 DIAGNOSIS — C3491 Malignant neoplasm of unspecified part of right bronchus or lung: Secondary | ICD-10-CM

## 2019-09-01 DIAGNOSIS — E119 Type 2 diabetes mellitus without complications: Secondary | ICD-10-CM | POA: Insufficient documentation

## 2019-09-01 DIAGNOSIS — Z5111 Encounter for antineoplastic chemotherapy: Secondary | ICD-10-CM | POA: Diagnosis not present

## 2019-09-01 DIAGNOSIS — I480 Paroxysmal atrial fibrillation: Secondary | ICD-10-CM

## 2019-09-01 DIAGNOSIS — Z87891 Personal history of nicotine dependence: Secondary | ICD-10-CM | POA: Insufficient documentation

## 2019-09-01 DIAGNOSIS — R5382 Chronic fatigue, unspecified: Secondary | ICD-10-CM

## 2019-09-01 DIAGNOSIS — I10 Essential (primary) hypertension: Secondary | ICD-10-CM | POA: Insufficient documentation

## 2019-09-01 LAB — CMP (CANCER CENTER ONLY)
ALT: 10 U/L (ref 0–44)
AST: 10 U/L — ABNORMAL LOW (ref 15–41)
Albumin: 2.6 g/dL — ABNORMAL LOW (ref 3.5–5.0)
Alkaline Phosphatase: 70 U/L (ref 38–126)
Anion gap: 11 (ref 5–15)
BUN: 16 mg/dL (ref 8–23)
CO2: 26 mmol/L (ref 22–32)
Calcium: 9 mg/dL (ref 8.9–10.3)
Chloride: 103 mmol/L (ref 98–111)
Creatinine: 0.79 mg/dL (ref 0.61–1.24)
GFR, Est AFR Am: >60 mL/min (ref >60)
GFR, Estimated: >60 mL/min (ref >60)
Glucose, Bld: 148 mg/dL — ABNORMAL HIGH (ref 70–99)
Potassium: 4.5 mmol/L (ref 3.5–5.1)
Sodium: 140 mmol/L (ref 135–145)
Total Bilirubin: 0.3 mg/dL (ref 0.3–1.2)
Total Protein: 7.4 g/dL (ref 6.5–8.1)

## 2019-09-01 LAB — TSH: TSH: 1.223 u[IU]/mL (ref 0.320–4.118)

## 2019-09-01 LAB — CBC WITH DIFFERENTIAL (CANCER CENTER ONLY)
Abs Immature Granulocytes: 0.08 10*3/uL — ABNORMAL HIGH (ref 0.00–0.07)
Basophils Absolute: 0 10*3/uL (ref 0.0–0.1)
Basophils Relative: 0 %
Eosinophils Absolute: 0.2 10*3/uL (ref 0.0–0.5)
Eosinophils Relative: 3 %
HCT: 29.3 % — ABNORMAL LOW (ref 39.0–52.0)
Hemoglobin: 8.7 g/dL — ABNORMAL LOW (ref 13.0–17.0)
Immature Granulocytes: 1 %
Lymphocytes Relative: 7 %
Lymphs Abs: 0.6 10*3/uL — ABNORMAL LOW (ref 0.7–4.0)
MCH: 27.7 pg (ref 26.0–34.0)
MCHC: 29.7 g/dL — ABNORMAL LOW (ref 30.0–36.0)
MCV: 93.3 fL (ref 80.0–100.0)
Monocytes Absolute: 0.5 10*3/uL (ref 0.1–1.0)
Monocytes Relative: 6 %
Neutro Abs: 6.8 10*3/uL (ref 1.7–7.7)
Neutrophils Relative %: 83 %
Platelet Count: 217 10*3/uL (ref 150–400)
RBC: 3.14 MIL/uL — ABNORMAL LOW (ref 4.22–5.81)
RDW: 18.6 % — ABNORMAL HIGH (ref 11.5–15.5)
WBC Count: 8.3 10*3/uL (ref 4.0–10.5)
nRBC: 0 % (ref 0.0–0.2)

## 2019-09-01 MED ORDER — DIPHENHYDRAMINE HCL 50 MG/ML IJ SOLN
50.0000 mg | Freq: Once | INTRAMUSCULAR | Status: AC
  Administered 2019-09-01: 50 mg via INTRAVENOUS

## 2019-09-01 MED ORDER — DIPHENHYDRAMINE HCL 50 MG/ML IJ SOLN
INTRAMUSCULAR | Status: AC
  Filled 2019-09-01: qty 1

## 2019-09-01 MED ORDER — SODIUM CHLORIDE 0.9 % IV SOLN
175.0000 mg/m2 | Freq: Once | INTRAVENOUS | Status: AC
  Administered 2019-09-01: 348 mg via INTRAVENOUS
  Filled 2019-09-01: qty 58

## 2019-09-01 MED ORDER — PALONOSETRON HCL INJECTION 0.25 MG/5ML
INTRAVENOUS | Status: AC
  Filled 2019-09-01: qty 5

## 2019-09-01 MED ORDER — DEXAMETHASONE SODIUM PHOSPHATE 10 MG/ML IJ SOLN
10.0000 mg | Freq: Once | INTRAMUSCULAR | Status: AC
  Administered 2019-09-01: 10 mg via INTRAVENOUS

## 2019-09-01 MED ORDER — SODIUM CHLORIDE 0.9 % IV SOLN
Freq: Once | INTRAVENOUS | Status: AC
  Filled 2019-09-01: qty 250

## 2019-09-01 MED ORDER — DILTIAZEM HCL 30 MG PO TABS
30.0000 mg | ORAL_TABLET | Freq: Once | ORAL | Status: AC
  Administered 2019-09-01: 30 mg via ORAL
  Filled 2019-09-01: qty 1

## 2019-09-01 MED ORDER — SODIUM CHLORIDE 0.9 % IV SOLN
150.0000 mg | Freq: Once | INTRAVENOUS | Status: AC
  Administered 2019-09-01: 150 mg via INTRAVENOUS
  Filled 2019-09-01: qty 150

## 2019-09-01 MED ORDER — FAMOTIDINE IN NACL 20-0.9 MG/50ML-% IV SOLN
INTRAVENOUS | Status: AC
  Filled 2019-09-01: qty 50

## 2019-09-01 MED ORDER — DILTIAZEM HCL 30 MG PO TABS: 30.0000 mg | ORAL_TABLET | ORAL | 0 refills | Status: DC | PRN

## 2019-09-01 MED ORDER — DEXAMETHASONE SODIUM PHOSPHATE 10 MG/ML IJ SOLN
INTRAMUSCULAR | Status: AC
  Filled 2019-09-01: qty 1

## 2019-09-01 MED ORDER — PALONOSETRON HCL INJECTION 0.25 MG/5ML
0.2500 mg | Freq: Once | INTRAVENOUS | Status: AC
  Administered 2019-09-01: 0.25 mg via INTRAVENOUS

## 2019-09-01 MED ORDER — SODIUM CHLORIDE 0.9 % IV SOLN
461.5000 mg | Freq: Once | INTRAVENOUS | Status: AC
  Administered 2019-09-01: 460 mg via INTRAVENOUS
  Filled 2019-09-01: qty 46

## 2019-09-01 MED ORDER — SODIUM CHLORIDE 0.9 % IV SOLN
200.0000 mg | Freq: Once | INTRAVENOUS | Status: AC
  Administered 2019-09-01: 200 mg via INTRAVENOUS
  Filled 2019-09-01: qty 8

## 2019-09-01 MED ORDER — FAMOTIDINE IN NACL 20-0.9 MG/50ML-% IV SOLN
20.0000 mg | Freq: Once | INTRAVENOUS | Status: AC
  Administered 2019-09-01: 20 mg via INTRAVENOUS

## 2019-09-01 NOTE — ED Triage Notes (Signed)
Pt brought to ED via [facility]. [facility] called out for afib. Pt wife, former Therapist, sports checked his pulse with pulse ox and stated pt was in a fib, called EMS. Per EMS, pt was in sinus rhythm upon arrival. Per family, pt recently started on new med and side effect a fib.

## 2019-09-01 NOTE — Discharge Instructions (Signed)
Your EKG is normal tonight  You likely had atrial fibrillation - read attached instructions  You should take your daily medicines exactly as prescribed by your doctor however, if you develop palpitations lasting longer than 15 minutes - please take one tablet of diltiazem - if this doesn't help take it again 30 minutes later, if still having a racing heart beat - come back to the ER immediately.  You will need to see the heart doctors for a holter monitor test - see the phone number above.

## 2019-09-01 NOTE — ED Provider Notes (Signed)
[facility] EMERGENCY DEPARTMENT Provider Note   CSN: [id number] Arrival date & time: 09/01/19  2104     History No chief complaint on file.   Jeremy [name] is a 74 y.o. male.  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  Has had > 10 radiation treatments and a thoracentesis and bronch 08/08/19 - he had first dose of chemo today - had a feeling of afib priefly after a recent procedure but otherwise no hx of afib - tonight he had 87 mintues of palpitaiotns - wife states was afib according to monitor - EMS arrived at [place] request and found him in NSR - pt had resolved at that point - he has no other sx other than chronic SOB due to COPD and lung CA.  He has no fever, n/v/swelling  And no diarrhea, cough, abd pain / cp, headache or blurred vision.  Sx resolved spontaneously.  He does not take any anticoagulants.  Reports puse was from 70 - 110 at home.  Wife has arrived as an additional historian and and reports that he had one episode of afib after a bronch - has not had any  meds since for it and no hx of afib.  EMR reviewed including lab work from earlier in the day - see below.  Past Medical History:  Diagnosis Date  . COPD (chronic obstructive pulmonary disease) (HCC)    QUIT SMOKING 30 YRS AGO  . Diabetes ([place]) 2018  . HTN (hypertension)   . Psoriatic arthritis ([place]) 2015    Patient Active Problem List   Diagnosis Date Noted  . 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 Encounter for antineoplastic chemotherapy 08/26/2019  . Encounter for antineoplastic immunotherapy 08/26/2019  . Goals of care, counseling/discussion 08/26/2019  . Atrial fibrillation with RVR ([place])   . Protein-calorie malnutrition, severe 08/07/2019  . Hemoptysis 08/06/2019  . Mass of right lung 08/06/2019  . Former smoker 08/06/2019  . Lung mass 08/06/2019  . Postobstructive pneumonia 08/06/2019  . COPD (chronic obstructive pulmonary disease) ([place])   . HTN  (hypertension)   . Abnormal weight loss   . Leukocytosis   . Acute and chronic respiratory failure with hypoxia ([place])   . Anemia in neoplastic disease   . Hyperglycemia   . Heme + stool 11/06/2018  . Diabetes ([place]) 2018    Past Surgical History:  Procedure Laterality Date  . BACK SURGERY  1990  . BIOPSY  12/30/2018   Procedure: BIOPSY;  Surgeon: Danie Binder, MD;  Location: [facility];  Service: Endoscopy;;  gastric  . CATARACT EXTRACTION Bilateral   . CERVICAL SPINE SURGERY  1995  . COLONOSCOPY WITH PROPOFOL N/A 12/30/2018   Procedure: COLONOSCOPY WITH PROPOFOL;  Surgeon: Danie Binder, MD;  Location: [facility];  Service: Endoscopy;  Laterality: N/A;  12:30pm  . ESOPHAGOGASTRODUODENOSCOPY (EGD) WITH PROPOFOL N/A 12/30/2018   Procedure: ESOPHAGOGASTRODUODENOSCOPY (EGD) WITH PROPOFOL;  Surgeon: Danie Binder, MD;  Location: [facility];  Service: Endoscopy;  Laterality: N/A;  . POLYPECTOMY  12/30/2018   Procedure: POLYPECTOMY;  Surgeon: Danie Binder, MD;  Location: [facility];  Service: Endoscopy;;  colon  . THORACENTESIS Right 08/08/2019   Procedure: Thoracentesis;  Surgeon: [name], MD;  Location: [place];  Service: Pulmonary;  Laterality: Right;  Marland Kitchen VIDEO BRONCHOSCOPY Right 08/08/2019   Procedure: Video Bronchoscopy with Erbe Cryo Biopsy of  Right mainstem;  Surgeon: [name], MD;  Location: [place];  Service: Pulmonary;  Laterality: Right;       Family History  Problem Relation Age of Onset  . Alcoholism Father   . CAD Brother   . Diabetes Brother   . Colon cancer Neg Hx   . Colon polyps Neg Hx   . Gastric cancer Neg Hx     Social History   Tobacco Use  . Smoking status: Former Smoker    Packs/day: 0.50    Years: 50.00    Pack years: 25.00    Types: Cigarettes    Quit date: 12/26/1991    Years since quitting: 27.7  . Smokeless tobacco: Never Used  Substance Use Topics  . Alcohol use: Yes    Comment: occ beer  . Drug use: Never    Home  Medications Prior to Admission medications   Medication Sig Start Date End Date Taking? Authorizing Provider  acetaminophen (TYLENOL) 500 MG tablet Take 500 mg by mouth every 4 (four) hours as needed (for pain.).    [provider]  albuterol (VENTOLIN HFA) 108 (90 Base) MCG/ACT inhaler Inhale 2 puffs into the lungs every 6 (six) hours as needed for wheezing or shortness of breath.    [provider]  Ascorbic Acid (VITAMIN C) 1000 MG tablet Take 1,000 mg by mouth daily.    [provider]  cyclobenzaprine (FLEXERIL) 10 MG tablet Take 10 mg by mouth at bedtime. 09/13/18   [provider]  diltiazem (CARDIZEM) 30 MG tablet Take 1 tablet (30 mg total) by mouth as needed. 09/01/19   Noemi Chapel, MD  docusate sodium (COLACE) 100 MG capsule Take 100 mg by mouth 2 (two) times daily.    [provider]  doxycycline (MONODOX) 50 MG capsule Take 50 mg by mouth 2 (two) times daily as needed (rosacea flare ups.).    [provider]  ferrous sulfate 325 (65 FE) MG tablet Take 325 mg by mouth daily with supper.    [provider]  FLOVENT HFA 110 MCG/ACT inhaler 1 puff 2 (two) times daily. 06/20/19   [provider]  folic acid (FOLVITE) 1 MG tablet Take 1 mg by mouth daily.    [provider]  gabapentin (NEURONTIN) 300 MG capsule Take 600 mg by mouth at bedtime.    [provider]  lidocaine-prilocaine (EMLA) cream Apply to the Port-A-Cath site 30 minutes before treatment 08/26/19   Curt Bears, MD  metFORMIN (GLUCOPHAGE-XR) 500 MG 24 hr tablet Take 500-1,000 mg by mouth See admin instructions. Take 2 tablets (1000 mg) by mouth in the morning & 1 tablet (500 mg) by mouth at night. 10/15/18   [provider]  metroNIDAZOLE (METROCREAM) 0.75 % cream Apply 1 application topically 2 (two) times daily as needed (facial rosacea).     [provider]  mometasone (ELOCON) 0.1 % cream Apply 1 application  topically 2 (two) times daily as needed (psoriasis).     [provider]  Omega-3 Fatty Acids (FISH OIL) 1000 MG CAPS Take 1,000 mg by mouth daily.     [provider]  pantoprazole (PROTONIX) 40 MG tablet 1 po 30 mins prior to first meal 12/30/18   Fields, Marga Melnick, MD  prochlorperazine (COMPAZINE) 10 MG tablet Take 1 tablet (10 mg total) by mouth every 6 (six) hours as needed for nausea or vomiting. 08/26/19   Curt Bears, MD  traMADol (ULTRAM) 50 MG tablet Take 50  mg by mouth every 4 (four) hours as needed. for pain 08/19/18   [provider]  umeclidinium-vilanterol (ANORO ELLIPTA) 62.5-25 MCG/INH AEPB Inhale 1 puff into the lungs daily. 08/13/19   Mariel Aloe, MD    Allergies    Aleve [naproxen sodium]  Review of Systems   Review of Systems  All other systems reviewed and are negative.   Physical Exam Updated Vital Signs BP 126/83 (BP Location: Right Arm)   Pulse 94   Temp 97.6 F (36.4 C) (Oral)   Resp (!) 26   Ht 1.88 m (6\' 2" )   Wt 75.3 kg   SpO2 97%   BMI 21.31 kg/m   Physical Exam Vitals and nursing note reviewed.  Constitutional:      General: He is not in acute distress.    Appearance: He is well-developed.  HENT:     Head: Normocephalic and atraumatic.     Mouth/Throat:     Pharynx: No oropharyngeal exudate.  Eyes:     General: No scleral icterus.       Right eye: No discharge.        Left eye: No discharge.     Conjunctiva/sclera: Conjunctivae normal.     Pupils: Pupils are equal, round, and reactive to light.  Neck:     Thyroid: No thyromegaly.     Vascular: No JVD.  Cardiovascular:     Rate and Rhythm: Normal rate and regular rhythm.     Heart sounds: Normal heart sounds. No murmur. No friction rub. No gallop.   Pulmonary:     Effort: Pulmonary effort is normal. No respiratory distress.     Breath sounds: Normal breath sounds. No wheezing or rales.  Abdominal:     General: Bowel sounds are normal. There is no  distension.     Palpations: Abdomen is soft. There is no mass.     Tenderness: There is no abdominal tenderness.  Musculoskeletal:        General: No tenderness. Normal range of motion.     Cervical back: Normal range of motion and neck supple.  Lymphadenopathy:     Cervical: No cervical adenopathy.  Skin:    General: Skin is warm and dry.     Findings: No erythema or rash.  Neurological:     Mental Status: He is alert.     Coordination: Coordination normal.  Psychiatric:        Behavior: Behavior normal.     ED Results / Procedures / Treatments   Labs (all labs ordered are listed, but only abnormal results are displayed) Labs Reviewed - No data to display  EKG EKG Interpretation  Date/Time:  Monday September 01 2019 21:10:49 EDT Ventricular Rate:  99 PR Interval:    QRS Duration: 95 QT Interval:  350 QTC Calculation: 450 R Axis:   67 Text Interpretation: Sinus rhythm Normal ECG Since last tracing afib has resolved Confirmed by Noemi Chapel [phone]) on 09/01/2019 9:34:39 PM   Radiology No results found.  Procedures Procedures (including critical care time)  Medications Ordered in ED Medications  diltiazem (CARDIZEM) tablet 30 mg (has no administration in time range)    ED Course  I have reviewed the triage vital signs and the nursing notes.  Pertinent labs & imaging results that were available during my care of the patient were reviewed by me and considered in my medical decision making (see chart for details).    MDM Rules/Calculators/A&P  Well appearing, EKG without arrhythmia or ischemia - no c/o at this time - may be having paroxysmal afib - this was first real episode (if was afib - assumptions made), he takes no BB's or CCB's.  Lab work done earlier in the day showed normal WBC and mild anemia at 8.7 (8.5 2 weeks ago), CMP without sig lyte abnormalities.  TSH was normal.  D/w family the need for f/u -= and holter monitor and pill int eh  pocket tx with dilt - they are in agreement - spouse has same problem and is familiar with this approach  Stable for d/c  Observation for an hour in ED, no return of Afib.    Final Clinical Impression(s) / ED Diagnoses Final diagnoses:  Paroxysmal atrial fibrillation ([place])    Rx / DC Orders ED Discharge Orders         Ordered    diltiazem (CARDIZEM) 30 MG tablet  As needed     09/01/19 2158           Noemi Chapel, MD 09/01/19 2200

## 2019-09-01 NOTE — Progress Notes (Signed)
Met with patient at registration to introduce myself as Arboriculturist and to offer available resources.  Discussed one-time $1000 Radio broadcast assistant to assist with personal expenses while going through treatment. Also gave him an application for the [employer] whom assists patients that live in [place] with personal expenses as well.  Gave him my card for any additional financial questions or concerns.

## 2019-09-01 NOTE — Patient Instructions (Signed)
[facility] Discharge Instructions for Patients Receiving Chemotherapy  Today you received the following chemotherapy agents Keytruda, Paclitaxel, Carboplatin  To help prevent nausea and vomiting after your treatment, we encourage you to take your nausea medication as prescribed.   If you develop nausea and vomiting that is not controlled by your nausea medication, call the clinic.   BELOW ARE SYMPTOMS THAT SHOULD BE REPORTED IMMEDIATELY:  *FEVER GREATER THAN 100.5 F  *CHILLS WITH OR WITHOUT FEVER  NAUSEA AND VOMITING THAT IS NOT CONTROLLED WITH YOUR NAUSEA MEDICATION  *UNUSUAL SHORTNESS OF BREATH  *UNUSUAL BRUISING OR BLEEDING  TENDERNESS IN MOUTH AND THROAT WITH OR WITHOUT PRESENCE OF ULCERS  *URINARY PROBLEMS  *BOWEL PROBLEMS  UNUSUAL RASH Items with * indicate a potential emergency and should be followed up as soon as possible.  Feel free to call the clinic should you have any questions or concerns. The clinic phone number is [phone].  Please show the [facility] at check-in to the Emergency Department and triage nurse.  Pembrolizumab injection What is this medicine? PEMBROLIZUMAB (pem broe liz ue mab) is a monoclonal antibody. It is used to treat certain types of cancer. This medicine may be used for other purposes; ask your health care provider or pharmacist if you have questions. COMMON BRAND NAME(S): Keytruda What should I tell my health care provider before I take this medicine? They need to know if you have any of these conditions:  diabetes  immune system problems  inflammatory bowel disease  liver disease  lung or breathing disease  lupus  received or scheduled to receive an organ transplant or a stem-cell transplant that uses donor stem cells  an unusual or allergic reaction to pembrolizumab, other medicines, foods, dyes, or preservatives  pregnant or trying to get pregnant  breast-feeding How should I use this  medicine? This medicine is for infusion into a vein. It is given by a health care professional in a hospital or clinic setting. A special MedGuide will be given to you before each treatment. Be sure to read this information carefully each time. Talk to your pediatrician regarding the use of this medicine in children. While this drug may be prescribed for children as young as 6 months for selected conditions, precautions do apply. Overdosage: If you think you have taken too much of this medicine contact a poison control center or emergency room at once. NOTE: This medicine is only for you. Do not share this medicine with others. What if I miss a dose? It is important not to miss your dose. Call your doctor or health care professional if you are unable to keep an appointment. What may interact with this medicine? Interactions have not been studied. Give your health care provider a list of all the medicines, herbs, non-prescription drugs, or dietary supplements you use. Also tell them if you smoke, drink alcohol, or use illegal drugs. Some items may interact with your medicine. This list may not describe all possible interactions. Give your health care provider a list of all the medicines, herbs, non-prescription drugs, or dietary supplements you use. Also tell them if you smoke, drink alcohol, or use illegal drugs. Some items may interact with your medicine. What should I watch for while using this medicine? Your condition will be monitored carefully while you are receiving this medicine. You may need blood work done while you are taking this medicine. Do not become pregnant while taking this medicine or for 4 months after stopping it.  Women should inform their doctor if they wish to become pregnant or think they might be pregnant. There is a potential for serious side effects to an unborn child. Talk to your health care professional or pharmacist for more information. Do not breast-feed an infant while  taking this medicine or for 4 months after the last dose. What side effects may I notice from receiving this medicine? Side effects that you should report to your doctor or health care professional as soon as possible:  allergic reactions like skin rash, itching or hives, swelling of the face, lips, or tongue  bloody or black, tarry  breathing problems  changes in vision  chest pain  chills  confusion  constipation  cough  diarrhea  dizziness or feeling faint or lightheaded  fast or irregular heartbeat  fever  flushing  joint pain  low blood counts - this medicine may decrease the number of white blood cells, red blood cells and platelets. You may be at increased risk for infections and bleeding.  muscle pain  muscle weakness  pain, tingling, numbness in the hands or feet  persistent headache  redness, blistering, peeling or loosening of the skin, including inside the mouth  signs and symptoms of high blood sugar such as dizziness; dry mouth; dry skin; fruity breath; nausea; stomach pain; increased hunger or thirst; increased urination  signs and symptoms of kidney injury like trouble passing urine or change in the amount of urine  signs and symptoms of liver injury like dark urine, light-colored stools, loss of appetite, nausea, right upper belly pain, yellowing of the eyes or skin  sweating  swollen lymph nodes  weight loss Side effects that usually do not require medical attention (report to your doctor or health care professional if they continue or are bothersome):  decreased appetite  hair loss  muscle pain  tiredness This list may not describe all possible side effects. Call your doctor for medical advice about side effects. You may report side effects to FDA at 1-800-FDA-1088. Where should I keep my medicine? This drug is given in a hospital or clinic and will not be stored at home. NOTE: This sheet is a summary. It may not cover all  possible information. If you have questions about this medicine, talk to your doctor, pharmacist, or health care provider.  2020 Elsevier/Gold Standard (2019-03-28 18:07:58)  Paclitaxel injection What is this medicine? PACLITAXEL (PAK li TAX el) is a chemotherapy drug. It targets fast dividing cells, like cancer cells, and causes these cells to die. This medicine is used to treat ovarian cancer, breast cancer, lung cancer, Kaposi's sarcoma, and other cancers. This medicine may be used for other purposes; ask your health care provider or pharmacist if you have questions. COMMON BRAND NAME(S): Onxol, Taxol What should I tell my health care provider before I take this medicine? They need to know if you have any of these conditions:  history of irregular heartbeat  liver disease  low blood counts, like low white cell, platelet, or red cell counts  lung or breathing disease, like asthma  tingling of the fingers or toes, or other nerve disorder  an unusual or allergic reaction to paclitaxel, alcohol, polyoxyethylated castor oil, other chemotherapy, other medicines, foods, dyes, or preservatives  pregnant or trying to get pregnant  breast-feeding How should I use this medicine? This drug is given as an infusion into a vein. It is administered in a hospital or clinic by a specially trained health care professional. Talk to  your pediatrician regarding the use of this medicine in children. Special care may be needed. Overdosage: If you think you have taken too much of this medicine contact a poison control center or emergency room at once. NOTE: This medicine is only for you. Do not share this medicine with others. What if I miss a dose? It is important not to miss your dose. Call your doctor or health care professional if you are unable to keep an appointment. What may interact with this medicine? Do not take this medicine with any of the following  medications:  disulfiram  metronidazole This medicine may also interact with the following medications:  antiviral medicines for hepatitis, HIV or AIDS  certain antibiotics like erythromycin and clarithromycin  certain medicines for fungal infections like ketoconazole and itraconazole  certain medicines for seizures like carbamazepine, phenobarbital, phenytoin  gemfibrozil  nefazodone  rifampin  St. John's wort This list may not describe all possible interactions. Give your health care provider a list of all the medicines, herbs, non-prescription drugs, or dietary supplements you use. Also tell them if you smoke, drink alcohol, or use illegal drugs. Some items may interact with your medicine. What should I watch for while using this medicine? Your condition will be monitored carefully while you are receiving this medicine. You will need important blood work done while you are taking this medicine. This medicine can cause serious allergic reactions. To reduce your risk you will need to take other medicine(s) before treatment with this medicine. If you experience allergic reactions like skin rash, itching or hives, swelling of the face, lips, or tongue, tell your doctor or health care professional right away. In some cases, you may be given additional medicines to help with side effects. Follow all directions for their use. This drug may make you feel generally unwell. This is not uncommon, as chemotherapy can affect healthy cells as well as cancer cells. Report any side effects. Continue your course of treatment even though you feel ill unless your doctor tells you to stop. Call your doctor or health care professional for advice if you get a fever, chills or sore throat, or other symptoms of a cold or flu. Do not treat yourself. This drug decreases your body's ability to fight infections. Try to avoid being around people who are sick. This medicine may increase your risk to bruise or  bleed. Call your doctor or health care professional if you notice any unusual bleeding. Be careful brushing and flossing your teeth or using a toothpick because you may get an infection or bleed more easily. If you have any dental work done, tell your dentist you are receiving this medicine. Avoid taking products that contain aspirin, acetaminophen, ibuprofen, naproxen, or ketoprofen unless instructed by your doctor. These medicines may hide a fever. Do not become pregnant while taking this medicine. Women should inform their doctor if they wish to become pregnant or think they might be pregnant. There is a potential for serious side effects to an unborn child. Talk to your health care professional or pharmacist for more information. Do not breast-feed an infant while taking this medicine. Men are advised not to father a child while receiving this medicine. This product may contain alcohol. Ask your pharmacist or healthcare provider if this medicine contains alcohol. Be sure to tell all healthcare providers you are taking this medicine. Certain medicines, like metronidazole and disulfiram, can cause an unpleasant reaction when taken with alcohol. The reaction includes flushing, headache, nausea, vomiting, sweating, and  increased thirst. The reaction can last from 30 minutes to several hours. What side effects may I notice from receiving this medicine? Side effects that you should report to your doctor or health care professional as soon as possible:  allergic reactions like skin rash, itching or hives, swelling of the face, lips, or tongue  breathing problems  changes in vision  fast, irregular heartbeat  high or low blood pressure  mouth sores  pain, tingling, numbness in the hands or feet  signs of decreased platelets or bleeding - bruising, pinpoint red spots on the skin, black, tarry stools, blood in the urine  signs of decreased red blood cells - unusually weak or tired, feeling faint  or lightheaded, falls  signs of infection - fever or chills, cough, sore throat, pain or difficulty passing urine  signs and symptoms of liver injury like dark yellow or brown urine; general ill feeling or flu-like symptoms; light-colored stools; loss of appetite; nausea; right upper belly pain; unusually weak or tired; yellowing of the eyes or skin  swelling of the ankles, feet, hands  unusually slow heartbeat Side effects that usually do not require medical attention (report to your doctor or health care professional if they continue or are bothersome):  diarrhea  hair loss  loss of appetite  muscle or joint pain  nausea, vomiting  pain, redness, or irritation at site where injected  tiredness This list may not describe all possible side effects. Call your doctor for medical advice about side effects. You may report side effects to FDA at 1-800-FDA-1088. Where should I keep my medicine? This drug is given in a hospital or clinic and will not be stored at home. NOTE: This sheet is a summary. It may not cover all possible information. If you have questions about this medicine, talk to your doctor, pharmacist, or health care provider.  2020 Elsevier/Gold Standard (2017-01-23 13:14:55)  Carboplatin injection What is this medicine? CARBOPLATIN (KAR boe pla tin) is a chemotherapy drug. It targets fast dividing cells, like cancer cells, and causes these cells to die. This medicine is used to treat ovarian cancer and many other cancers. This medicine may be used for other purposes; ask your health care provider or pharmacist if you have questions. COMMON BRAND NAME(S): Paraplatin What should I tell my health care provider before I take this medicine? They need to know if you have any of these conditions:  blood disorders  hearing problems  kidney disease  recent or ongoing radiation therapy  an unusual or allergic reaction to carboplatin, cisplatin, other chemotherapy, other  medicines, foods, dyes, or preservatives  pregnant or trying to get pregnant  breast-feeding How should I use this medicine? This drug is usually given as an infusion into a vein. It is administered in a hospital or clinic by a specially trained health care professional. Talk to your pediatrician regarding the use of this medicine in children. Special care may be needed. Overdosage: If you think you have taken too much of this medicine contact a poison control center or emergency room at once. NOTE: This medicine is only for you. Do not share this medicine with others. What if I miss a dose? It is important not to miss a dose. Call your doctor or health care professional if you are unable to keep an appointment. What may interact with this medicine?  medicines for seizures  medicines to increase blood counts like filgrastim, pegfilgrastim, sargramostim  some antibiotics like amikacin, gentamicin, neomycin, streptomycin, tobramycin  vaccines Talk to your doctor or health care professional before taking any of these medicines:  acetaminophen  aspirin  ibuprofen  ketoprofen  naproxen This list may not describe all possible interactions. Give your health care provider a list of all the medicines, herbs, non-prescription drugs, or dietary supplements you use. Also tell them if you smoke, drink alcohol, or use illegal drugs. Some items may interact with your medicine. What should I watch for while using this medicine? Your condition will be monitored carefully while you are receiving this medicine. You will need important blood work done while you are taking this medicine. This drug may make you feel generally unwell. This is not uncommon, as chemotherapy can affect healthy cells as well as cancer cells. Report any side effects. Continue your course of treatment even though you feel ill unless your doctor tells you to stop. In some cases, you may be given additional medicines to help  with side effects. Follow all directions for their use. Call your doctor or health care professional for advice if you get a fever, chills or sore throat, or other symptoms of a cold or flu. Do not treat yourself. This drug decreases your body's ability to fight infections. Try to avoid being around people who are sick. This medicine may increase your risk to bruise or bleed. Call your doctor or health care professional if you notice any unusual bleeding. Be careful brushing and flossing your teeth or using a toothpick because you may get an infection or bleed more easily. If you have any dental work done, tell your dentist you are receiving this medicine. Avoid taking products that contain aspirin, acetaminophen, ibuprofen, naproxen, or ketoprofen unless instructed by your doctor. These medicines may hide a fever. Do not become pregnant while taking this medicine. Women should inform their doctor if they wish to become pregnant or think they might be pregnant. There is a potential for serious side effects to an unborn child. Talk to your health care professional or pharmacist for more information. Do not breast-feed an infant while taking this medicine. What side effects may I notice from receiving this medicine? Side effects that you should report to your doctor or health care professional as soon as possible:  allergic reactions like skin rash, itching or hives, swelling of the face, lips, or tongue  signs of infection - fever or chills, cough, sore throat, pain or difficulty passing urine  signs of decreased platelets or bleeding - bruising, pinpoint red spots on the skin, black, tarry stools, nosebleeds  signs of decreased red blood cells - unusually weak or tired, fainting spells, lightheadedness  breathing problems  changes in hearing  changes in vision  chest pain  high blood pressure  low blood counts - This drug may decrease the number of white blood cells, red blood cells and  platelets. You may be at increased risk for infections and bleeding.  nausea and vomiting  pain, swelling, redness or irritation at the injection site  pain, tingling, numbness in the hands or feet  problems with balance, talking, walking  trouble passing urine or change in the amount of urine Side effects that usually do not require medical attention (report to your doctor or health care professional if they continue or are bothersome):  hair loss  loss of appetite  metallic taste in the mouth or changes in taste This list may not describe all possible side effects. Call your doctor for medical advice about side effects. You may report side  effects to FDA at 1-800-FDA-1088. Where should I keep my medicine? This drug is given in a hospital or clinic and will not be stored at home. NOTE: This sheet is a summary. It may not cover all possible information. If you have questions about this medicine, talk to your doctor, pharmacist, or health care provider.  2020 Elsevier/Gold Standard (2007-08-27 14:38:05)

## 2019-09-02 ENCOUNTER — Other Ambulatory Visit: Payer: Self-pay | Admitting: Radiology

## 2019-09-02 ENCOUNTER — Telehealth: Payer: Self-pay | Admitting: *Deleted

## 2019-09-03 ENCOUNTER — Inpatient Hospital Stay: Payer: Medicare Other

## 2019-09-03 ENCOUNTER — Other Ambulatory Visit: Payer: Self-pay

## 2019-09-03 VITALS — BP 109/75 | HR 94 | Temp 98.0°F | Resp 18

## 2019-09-03 DIAGNOSIS — Z5111 Encounter for antineoplastic chemotherapy: Secondary | ICD-10-CM | POA: Diagnosis not present

## 2019-09-03 DIAGNOSIS — C3491 Malignant neoplasm of unspecified part of right bronchus or lung: Secondary | ICD-10-CM

## 2019-09-03 MED ORDER — PEGFILGRASTIM-JMDB 6 MG/0.6ML ~~LOC~~ SOSY
6.0000 mg | PREFILLED_SYRINGE | Freq: Once | SUBCUTANEOUS | Status: AC
  Administered 2019-09-03: 12:00:00 6 mg via SUBCUTANEOUS

## 2019-09-03 MED ORDER — PEGFILGRASTIM-JMDB 6 MG/0.6ML ~~LOC~~ SOSY
PREFILLED_SYRINGE | SUBCUTANEOUS | Status: AC
  Filled 2019-09-03: qty 0.6

## 2019-09-03 NOTE — Patient Instructions (Signed)

## 2019-09-04 ENCOUNTER — Other Ambulatory Visit: Payer: Self-pay | Admitting: Internal Medicine

## 2019-09-04 ENCOUNTER — Encounter (HOSPITAL_COMMUNITY): Payer: Self-pay

## 2019-09-04 ENCOUNTER — Ambulatory Visit (HOSPITAL_COMMUNITY)
Admission: RE | Admit: 2019-09-04 | Discharge: 2019-09-04 | Disposition: A | Discharge status: Home / Self Care | Payer: Medicare Other | Source: Ambulatory Visit | Attending: Internal Medicine | Admitting: Internal Medicine

## 2019-09-04 DIAGNOSIS — Z87891 Personal history of nicotine dependence: Secondary | ICD-10-CM | POA: Insufficient documentation

## 2019-09-04 DIAGNOSIS — I10 Essential (primary) hypertension: Secondary | ICD-10-CM | POA: Diagnosis not present

## 2019-09-04 DIAGNOSIS — Z8249 Family history of ischemic heart disease and other diseases of the circulatory system: Secondary | ICD-10-CM | POA: Insufficient documentation

## 2019-09-04 DIAGNOSIS — Z7984 Long term (current) use of oral hypoglycemic drugs: Secondary | ICD-10-CM | POA: Diagnosis not present

## 2019-09-04 DIAGNOSIS — Z452 Encounter for adjustment and management of vascular access device: Secondary | ICD-10-CM | POA: Diagnosis not present

## 2019-09-04 DIAGNOSIS — Z886 Allergy status to analgesic agent status: Secondary | ICD-10-CM | POA: Insufficient documentation

## 2019-09-04 DIAGNOSIS — Z833 Family history of diabetes mellitus: Secondary | ICD-10-CM | POA: Diagnosis not present

## 2019-09-04 DIAGNOSIS — J449 Chronic obstructive pulmonary disease, unspecified: Secondary | ICD-10-CM | POA: Insufficient documentation

## 2019-09-04 DIAGNOSIS — C3491 Malignant neoplasm of unspecified part of right bronchus or lung: Secondary | ICD-10-CM | POA: Insufficient documentation

## 2019-09-04 DIAGNOSIS — Z79899 Other long term (current) drug therapy: Secondary | ICD-10-CM | POA: Insufficient documentation

## 2019-09-04 DIAGNOSIS — L4050 Arthropathic psoriasis, unspecified: Secondary | ICD-10-CM | POA: Diagnosis not present

## 2019-09-04 DIAGNOSIS — E119 Type 2 diabetes mellitus without complications: Secondary | ICD-10-CM | POA: Insufficient documentation

## 2019-09-04 HISTORY — PX: IR IMAGING GUIDED PORT INSERTION: IMG5740

## 2019-09-04 LAB — CBC WITH DIFFERENTIAL/PLATELET
Abs Immature Granulocytes: 1.69 10*3/uL — ABNORMAL HIGH (ref 0.00–0.07)
Basophils Absolute: 0.1 10*3/uL (ref 0.0–0.1)
Basophils Relative: 0 %
Eosinophils Absolute: 0.2 10*3/uL (ref 0.0–0.5)
Eosinophils Relative: 1 %
HCT: 29.4 % — ABNORMAL LOW (ref 39.0–52.0)
Hemoglobin: 8.8 g/dL — ABNORMAL LOW (ref 13.0–17.0)
Immature Granulocytes: 6 %
Lymphocytes Relative: 1 %
Lymphs Abs: 0.4 10*3/uL — ABNORMAL LOW (ref 0.7–4.0)
MCH: 27.8 pg (ref 26.0–34.0)
MCHC: 29.9 g/dL — ABNORMAL LOW (ref 30.0–36.0)
MCV: 93 fL (ref 80.0–100.0)
Monocytes Absolute: 0.3 10*3/uL (ref 0.1–1.0)
Monocytes Relative: 1 %
Neutro Abs: 24.9 10*3/uL — ABNORMAL HIGH (ref 1.7–7.7)
Neutrophils Relative %: 91 %
Platelets: 171 10*3/uL (ref 150–400)
RBC: 3.16 MIL/uL — ABNORMAL LOW (ref 4.22–5.81)
RDW: 18.6 % — ABNORMAL HIGH (ref 11.5–15.5)
WBC: 27.4 10*3/uL — ABNORMAL HIGH (ref 4.0–10.5)
nRBC: 0 % (ref 0.0–0.2)

## 2019-09-04 MED ORDER — NALOXONE HCL 0.4 MG/ML IJ SOLN
INTRAMUSCULAR | Status: AC
  Filled 2019-09-04: qty 1

## 2019-09-04 MED ORDER — CEFAZOLIN SODIUM-DEXTROSE 2-4 GM/100ML-% IV SOLN
INTRAVENOUS | Status: AC
  Filled 2019-09-04: qty 100

## 2019-09-04 MED ORDER — HEPARIN SOD (PORK) LOCK FLUSH 100 UNIT/ML IV SOLN
INTRAVENOUS | Status: AC
  Filled 2019-09-04: qty 5

## 2019-09-04 MED ORDER — MIDAZOLAM HCL 2 MG/2ML IJ SOLN
INTRAMUSCULAR | Status: AC
  Filled 2019-09-04: qty 4

## 2019-09-04 MED ORDER — SODIUM CHLORIDE 0.9 % IV SOLN: INTRAVENOUS | Status: DC

## 2019-09-04 MED ORDER — LIDOCAINE-EPINEPHRINE 1 %-1:100000 IJ SOLN
INTRAMUSCULAR | Status: AC
  Filled 2019-09-04: qty 1

## 2019-09-04 MED ORDER — FLUMAZENIL 0.5 MG/5ML IV SOLN
INTRAVENOUS | Status: AC
  Filled 2019-09-04: qty 5

## 2019-09-04 MED ORDER — FENTANYL CITRATE (PF) 100 MCG/2ML IJ SOLN
INTRAMUSCULAR | Status: AC
  Filled 2019-09-04: qty 4

## 2019-09-04 MED ORDER — FENTANYL CITRATE (PF) 100 MCG/2ML IJ SOLN
INTRAMUSCULAR | Status: AC | PRN
  Administered 2019-09-04: 50 ug via INTRAVENOUS
  Administered 2019-09-04 (×2): 25 ug via INTRAVENOUS

## 2019-09-04 MED ORDER — MIDAZOLAM HCL 2 MG/2ML IJ SOLN
INTRAMUSCULAR | Status: AC | PRN
  Administered 2019-09-04: 1 mg via INTRAVENOUS
  Administered 2019-09-04 (×2): 0.5 mg via INTRAVENOUS

## 2019-09-04 MED ORDER — CEFAZOLIN SODIUM-DEXTROSE 2-4 GM/100ML-% IV SOLN
2.0000 g | INTRAVENOUS | Status: AC
  Administered 2019-09-04: 14:00:00 2 g via INTRAVENOUS

## 2019-09-04 NOTE — Discharge Instructions (Signed)
DO NOT APPLY ANY EMLA CREAM OR LOTIONS TO PORT SITE X 2 WEEKS  For any questions or concerns call [phone]; for after hours call [phone] and ask for on call MD  Implanted Port Insertion, Care After This sheet gives you information about how to care for yourself after your procedure. Your health care provider may also give you more specific instructions. If you have problems or questions, contact your health care provider. What can I expect after the procedure? After the procedure, it is common to have:  Discomfort at the port insertion site.  Bruising on the skin over the port. This should improve over 3-4 days. Follow these instructions at home: [facility] care  After your port is placed, you will get a manufacturer's information card. The card has information about your port. Keep this card with you at all times.  Take care of the port as told by your health care provider. Ask your health care provider if you or a family member can get training for taking care of the port at home. A home health care nurse may also take care of the port.  Make sure to remember what type of port you have. Incision care      Follow instructions from your health care provider about how to take care of your port insertion site. Make sure you: ? Wash your hands with soap and water before and after you change your bandage (dressing). If soap and water are not available, use hand sanitizer. ? Change your dressing as told by your health care provider. ? Leave stitches (sutures), skin glue, or adhesive strips in place. These skin closures may need to stay in place for 2 weeks or longer. If adhesive strip edges start to loosen and curl up, you may trim the loose edges. Do not remove adhesive strips completely unless your health care provider tells you to do that.  Check your port insertion site every day for signs of infection. Check for: ? Redness, swelling, or pain. ? Fluid or blood. ? Warmth. ? Pus or  a bad smell. Activity  Return to your normal activities as told by your health care provider. Ask your health care provider what activities are safe for you.  Do not lift anything that is heavier than 10 lb (4.5 kg), or the limit that you are told, until your health care provider says that it is safe. General instructions  Take over-the-counter and prescription medicines only as told by your health care provider.  Do not take baths, swim, or use a hot tub until your health care provider approves. Ask your health care provider if you may take showers. You may only be allowed to take sponge baths.  Do not drive for 24 hours if you were given a sedative during your procedure.  Wear a medical alert bracelet in case of an emergency. This will tell any health care providers that you have a port.  Keep all follow-up visits as told by your health care provider. This is important. Contact a health care provider if:  You cannot flush your port with saline as directed, or you cannot draw blood from the port.  You have a fever or chills.  You have redness, swelling, or pain around your port insertion site.  You have fluid or blood coming from your port insertion site.  Your port insertion site feels warm to the touch.  You have pus or a bad smell coming from the port insertion site. Get help right  away if:  You have chest pain or shortness of breath.  You have bleeding from your port that you cannot control. Summary  Take care of the port as told by your health care provider. Keep the manufacturer's information card with you at all times.  Change your dressing as told by your health care provider.  Contact a health care provider if you have a fever or chills or if you have redness, swelling, or pain around your port insertion site.  Keep all follow-up visits as told by your health care provider. This information is not intended to replace advice given to you by your health care  provider. Make sure you discuss any questions you have with your health care provider. Document Revised: 12/18/2017 Document Reviewed: 12/18/2017 Elsevier Patient Education  [place]. Moderate Conscious Sedation, Adult, Care After These instructions provide you with information about caring for yourself after your procedure. Your health care provider may also give you more specific instructions. Your treatment has been planned according to current medical practices, but problems sometimes occur. Call your health care provider if you have any problems or questions after your procedure. What can I expect after the procedure? After your procedure, it is common:  To feel sleepy for several hours.  To feel clumsy and have poor balance for several hours.  To have poor judgment for several hours.  To vomit if you eat too soon. Follow these instructions at home: For at least 24 hours after the procedure:   Do not: ? Participate in activities where you could fall or become injured. ? Drive. ? Use heavy machinery. ? Drink alcohol. ? Take sleeping pills or medicines that cause drowsiness. ? Make important decisions or sign legal documents. ? Take care of children on your own.  Rest. Eating and drinking  Follow the diet recommended by your health care provider.  If you vomit: ? Drink water, juice, or soup when you can drink without vomiting. ? Make sure you have little or no nausea before eating solid foods. General instructions  Have a responsible adult stay with you until you are awake and alert.  Take over-the-counter and prescription medicines only as told by your health care provider.  If you smoke, do not smoke without supervision.  Keep all follow-up visits as told by your health care provider. This is important. Contact a health care provider if:  You keep feeling nauseous or you keep vomiting.  You feel light-headed.  You develop a rash.  You have a  fever. Get help right away if:  You have trouble breathing. This information is not intended to replace advice given to you by your health care provider. Make sure you discuss any questions you have with your health care provider. Document Revised: 05/04/2017 Document Reviewed: 09/11/2015 Elsevier Patient Education  2020 [employer].

## 2019-09-04 NOTE — H&P (Signed)
Chief Complaint: Patient was seen in consultation today for lung cancer/Port-a-cath placement.  Referring Physician(s): Mohamed,Mohamed  Supervising Physician: Jacqulynn Cadet  Patient Status: [facility] - Out-pt  History of Present Illness: Jeremy [name] is a 74 y.o. male with a past medical history of hypertension, COPD, lung cancer, diabetes mellitus, and psoriatic arthritis. 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 His cancer is managed by Dr. Julien Nordmann and Dr. Sondra Come. He has completed a course of radiation therapy to the right lower lobe lung mass in 08/2019. He has tentative plans to begin systemic chemotherapy for further management.  IR requested by Dr. Julien Nordmann for possible image-guided Port-a-cath placement. Patient awake and alert laying in bed watching TV with no complaints at this time. Denies fever, chills, chest pain, dyspnea, abdominal pain, or headache.   Past Medical History:  Diagnosis Date  . COPD (chronic obstructive pulmonary disease) (HCC)    QUIT SMOKING 30 YRS AGO  . Diabetes ([place]) 2018  . HTN (hypertension)   . Psoriatic arthritis ([place]) 2015    Past Surgical History:  Procedure Laterality Date  . BACK SURGERY  1990  . BIOPSY  12/30/2018   Procedure: BIOPSY;  Surgeon: Danie Binder, MD;  Location: [facility];  Service: Endoscopy;;  gastric  . CATARACT EXTRACTION Bilateral   . CERVICAL SPINE SURGERY  1995  . COLONOSCOPY WITH PROPOFOL N/A 12/30/2018   Procedure: COLONOSCOPY WITH PROPOFOL;  Surgeon: Danie Binder, MD;  Location: [facility];  Service: Endoscopy;  Laterality: N/A;  12:30pm  . ESOPHAGOGASTRODUODENOSCOPY (EGD) WITH PROPOFOL N/A 12/30/2018   Procedure: ESOPHAGOGASTRODUODENOSCOPY (EGD) WITH PROPOFOL;  Surgeon: Danie Binder, MD;  Location: [facility];  Service: Endoscopy;  Laterality: N/A;  . POLYPECTOMY  12/30/2018   Procedure: POLYPECTOMY;  Surgeon: Danie Binder, MD;  Location: [facility]  ENDO SUITE;  Service: Endoscopy;;  colon  . THORACENTESIS Right 08/08/2019   Procedure: Thoracentesis;  Surgeon: [name], MD;  Location: [place];  Service: Pulmonary;  Laterality: Right;  Marland Kitchen VIDEO BRONCHOSCOPY Right 08/08/2019   Procedure: Video Bronchoscopy with Erbe Cryo Biopsy of Right mainstem;  Surgeon: [name], MD;  Location: [facility];  Service: Pulmonary;  Laterality: Right;    Allergies: Aleve [naproxen sodium]  Medications: Prior to Admission medications   Medication Sig Start Date End Date Taking? Authorizing Provider  acetaminophen (TYLENOL) 500 MG tablet Take 500 mg by mouth every 4 (four) hours as needed (for pain.).    [provider]  albuterol (VENTOLIN HFA) 108 (90 Base) MCG/ACT inhaler Inhale 2 puffs into the lungs every 6 (six) hours as needed for wheezing or shortness of breath.    [provider]  Ascorbic Acid (VITAMIN C) 1000 MG tablet Take 1,000 mg by mouth daily.    [provider]  cyclobenzaprine (FLEXERIL) 10 MG tablet Take 10 mg by mouth at bedtime. 09/13/18   [provider]  diltiazem (CARDIZEM) 30 MG tablet Take 1 tablet (30 mg total) by mouth as needed. 09/01/19   Noemi Chapel, MD  docusate sodium (COLACE) 100 MG capsule Take 100 mg by mouth 2 (two) times daily.    [provider]  doxycycline (MONODOX) 50 MG capsule Take 50 mg by mouth 2 (two) times daily as needed (rosacea flare ups.).    [provider]  ferrous sulfate 325 (65 FE) MG tablet Take 325 mg by mouth daily with supper.    [provider]  FLOVENT HFA 110 MCG/ACT inhaler 1 puff 2 (two) times daily. 06/20/19   [provider]  folic acid (FOLVITE) 1 MG tablet Take 1 mg by mouth daily.    [provider]  gabapentin (NEURONTIN) 300 MG capsule Take 600 mg by mouth at bedtime.    [provider]  lidocaine-prilocaine (EMLA) cream Apply to the Port-A-Cath site 30 minutes before treatment 08/26/19   Curt Bears, MD  metFORMIN (GLUCOPHAGE-XR) 500 MG 24 hr tablet Take 500-1,000 mg by mouth See admin instructions. Take 2 tablets (1000 mg) by mouth in the morning & 1 tablet (500 mg) by mouth at night. 10/15/18   [provider]  metroNIDAZOLE (METROCREAM) 0.75 % cream Apply 1 application topically 2 (two) times daily as needed (facial rosacea).     [provider]  mometasone (ELOCON) 0.1 % cream Apply 1 application topically 2 (two) times daily as needed (psoriasis).     [provider]  Omega-3 Fatty Acids (FISH OIL) 1000 MG CAPS Take 1,000 mg by mouth daily.     [provider]  pantoprazole (PROTONIX) 40 MG tablet 1 po 30 mins prior to first meal 12/30/18   Fields, Marga Melnick, MD  prochlorperazine (COMPAZINE) 10 MG tablet Take 1 tablet (10 mg total) by mouth every 6 (six) hours as needed for nausea or vomiting. 08/26/19   Curt Bears, MD  traMADol (ULTRAM) 50 MG tablet Take 50 mg by mouth every 4 (four) hours as needed. for pain 08/19/18   [provider]  umeclidinium-vilanterol (ANORO ELLIPTA) 62.5-25 MCG/INH AEPB Inhale 1 puff into the lungs daily. 08/13/19   Mariel Aloe, MD     Family History  Problem Relation Age of Onset  . Alcoholism Father   . CAD Brother   . Diabetes Brother   . Colon cancer Neg Hx   . Colon polyps Neg Hx   . Gastric cancer Neg Hx     Social History   Socioeconomic History  . Marital status: Married    Spouse name: Not on file  . Number of children: Not on file  . Years of education: Not on file  . Highest education level: Not on file  Occupational History  . Not on file  Tobacco Use  . Smoking status: Former Smoker    Packs/day: 0.50    Years: 50.00    Pack years: 25.00    Types: Cigarettes    Quit date: 12/26/1991    Years since quitting: 27.7  . Smokeless tobacco: Never Used  Substance and Sexual Activity  . Alcohol use: Yes    Comment: occ beer  . Drug use: Never  . Sexual activity: Yes    Birth  control/protection: None  Other Topics Concern  . 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 ATFTDDU(2025). 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   Social Determinants of Health   Financial Resource Strain:   . Difficulty of Paying Living Expenses:   Food Insecurity:   . Worried About Charity fundraiser in the Last Year:   . Arboriculturist in the Last Year:   Transportation Needs:   . Film/video editor (Medical):   Marland Kitchen Lack of Transportation (Non-Medical):   Physical Activity:   . Days of Exercise per Week:   . Minutes of Exercise per Session:   Stress:   . Feeling of Stress :   Social  Connections:   . Frequency of Communication with Friends and Family:   . Frequency of Social Gatherings with Friends and Family:   . Attends Religious Services:   . Active Member of Clubs or Organizations:   . Attends Archivist Meetings:   Marland Kitchen Marital Status:      Review of Systems: A 12 point ROS discussed and pertinent positives are indicated in the HPI above.  All other systems are negative.  Review of Systems  Constitutional: Negative for chills and fever.  Respiratory: Negative for shortness of breath and wheezing.   Cardiovascular: Negative for chest pain and palpitations.  Gastrointestinal: Negative for abdominal pain.  Neurological: Negative for headaches.  Psychiatric/Behavioral: Negative for behavioral problems and confusion.    Vital Signs: BP 103/73 (BP Location: Right Arm)   Pulse (!) 106   Temp 98.2 F (36.8 C) (Oral)   Resp 18   SpO2 97%   Physical Exam Vitals and nursing note reviewed.  Constitutional:      General: He is not in acute distress.    Appearance: Normal appearance.  Cardiovascular:     Rate and Rhythm: Regular rhythm. Tachycardia present.     Heart sounds: Normal heart sounds. No murmur.  Pulmonary:     Effort: Pulmonary effort is normal. No respiratory distress.      Breath sounds: Normal breath sounds. No wheezing.  Skin:    General: Skin is warm and dry.  Neurological:     Mental Status: He is alert and oriented to person, place, and time.      MD Evaluation Airway: WNL Heart: WNL Abdomen: WNL Chest/ Lungs: WNL ASA  Classification: 3 Mallampati/Airway Score: Two   Imaging: CT ANGIO CHEST PE W OR WO CONTRAST  Result Date: 08/06/2019 CLINICAL DATA:  Persistent shortness of breath, hemoptysis, hypoxia. EXAM: CT ANGIOGRAPHY CHEST WITH CONTRAST TECHNIQUE: Multidetector CT imaging of the chest was performed using the standard protocol during bolus administration of intravenous contrast. Multiplanar CT image reconstructions and MIPs were obtained to evaluate the vascular anatomy. CONTRAST:  124mL OMNIPAQUE IOHEXOL 350 MG/ML SOLN COMPARISON:  Chest radiograph 08/06/2019. FINDINGS: Cardiovascular: Negative for pulmonary embolus. Atherosclerotic calcification of the aorta and coronary arteries. Heart size normal. No pericardial effusion. Mediastinum/Nodes: Mediastinal lymph nodes measure up to 8 mm in the high right paratracheal station. Probable 1.5 cm subcarinal lymph node. 10 mm right hilar lymph node. No left hilar or axillary lymph nodes. Prepericardiac lymph nodes are not enlarged by CT size criteria. Esophagus is grossly unremarkable. Lungs/Pleura: A cavitary mass in the right lower lobe extends 2 and involves the carina and measures approximately 6.5 x 9.9 cm. There is an air-fluid level within. Complete obstruction of the right lower lobe with collapse/consolidation and necrosis in the right lower lobe. Near complete obstruction of the right upper and middle lobe bronchi with extensive perilymphatic nodularity and mild septal thickening in the aerated right upper and middle lobes. Moderate right pleural effusion. Mild centrilobular emphysema. Left lung is clear. Debris is seen in the proximal left mainstem bronchus, in addition to the right bronchial tree,  as discussed previously. Upper Abdomen: Visualized portions of the liver, adrenal glands, left kidney, spleen, pancreas, stomach and bowel are grossly unremarkable. No upper abdominal adenopathy. Musculoskeletal: Degenerative changes in the spine. Review of the MIP images confirms the above findings. IMPRESSION: 1. Negative for pulmonary embolus. 2. Large necrotic mass extends to the carina with associated obstruction of the right lower lobe bronchus. Postobstructive collapse/consolidation and necrosis in  the right lower lobe. 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 3. 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 4. Borderline right paratracheal and subcarinal lymph nodes, likely metastatic. 5. Aortic atherosclerosis (ICD10-I70.0). Coronary artery calcification. Electronically Signed   By: Lorin Picket M.D.   On: 08/06/2019 13:40   MR BRAIN W WO CONTRAST  Result Date: 08/10/2019 CLINICAL DATA:  Lung cancer, staging EXAM: MRI HEAD WITHOUT AND WITH CONTRAST TECHNIQUE: Multiplanar, multiecho pulse sequences of the brain and surrounding structures were obtained without and with intravenous contrast. CONTRAST:  44mL GADAVIST GADOBUTROL 1 MMOL/ML IV SOLN COMPARISON:  None. FINDINGS: Brain: There is no acute infarction or intracranial hemorrhage. There is no intracranial mass, mass effect, or edema. There is no hydrocephalus or extra-axial fluid collection. Prominence of the ventricles and sulci reflects mild generalized parenchymal volume loss. Patchy foci of T2 hyperintensity in the supratentorial and pontine white matter are nonspecific but may reflect mild chronic microvascular ischemic changes. Incidental note is made of a small cerebellar developmental venous anomaly. Vascular: Major vessel flow voids at the skull base are preserved. Skull and upper cervical spine: Normal  marrow signal is preserved. Sinuses/Orbits: Mild mucosal thickening. Bilateral lens replacements. Other: Sella is unremarkable.  Mastoid air cells are clear. IMPRESSION: No evidence of intracranial metastatic disease. Mild chronic microvascular ischemic changes. Electronically Signed   By: Macy Mis M.D.   On: 08/10/2019 12:52   CT ABDOMEN PELVIS W CONTRAST  Result Date: 08/11/2019 CLINICAL DATA:  Lung cancer assess for metastatic disease EXAM: CT ABDOMEN AND PELVIS WITH CONTRAST TECHNIQUE: Multidetector CT imaging of the abdomen and pelvis was performed using the standard protocol following bolus administration of intravenous contrast. CONTRAST:  123mL OMNIPAQUE IOHEXOL 300 MG/ML  SOLN COMPARISON:  CT chest 08/06/2019, MRI 06/03/2019, 08/10/2019 FINDINGS: Lower chest: Small right-sided pleural effusion with volume loss in the right thorax and shift of mediastinal contents to the right. Incompletely visualized necrotic lung mass in the right lower lobe. Normal heart size. Trace pericardial effusion. Subcentimeter right cardio phrenic lymph nodes. Hepatobiliary: No focal hepatic abnormality. No calcified gallstone or biliary dilatation. Pancreas: Unremarkable. No pancreatic ductal dilatation or surrounding inflammatory changes. Spleen: Normal in size without focal abnormality. Adrenals/Urinary Tract: Right adrenal gland is normal. Nodular thickening of left adrenal gland without discrete mass. The kidneys show no hydronephrosis. Subcentimeter hypodensity in the mid left kidney, too small to further characterize. The bladder is normal. Stomach/Bowel: Stomach is within normal limits. Appendix appears normal. No evidence of bowel wall thickening, distention, or inflammatory changes. Scattered diverticula in the sigmoid colon without acute inflammatory change Vascular/Lymphatic: Moderate aortic atherosclerosis. Tortuous aorta with focally ectatic infrarenal aorta up to 2.8 cm. No significant adenopathy.  Reproductive: Prostate is unremarkable. Other: Negative for free fluid or free air. Small fat containing right inguinal hernia. Musculoskeletal: No acute or suspicious osseous abnormality. Degenerative changes most advanced at L4-L5. IMPRESSION: 1. Small right-sided pleural effusion with incompletely visualized necrotic lung mass in the right lower lobe. Volume loss on the right with shift of mediastinal contents to the right. 2. Otherwise negative CT examination of the abdomen and pelvis. No definitive evidence for metastatic disease Electronically Signed   By: Donavan Foil M.D.   On: 08/11/2019 19:52   DG Chest Port 1 View  Result Date: 08/06/2019 CLINICAL DATA:  Shortness of breath EXAM: PORTABLE CHEST 1 VIEW COMPARISON:  06/20/2019 FINDINGS: Volume loss  on the right. Airspace disease throughout the right lung concerning for pneumonia. Small right pleural effusion. Left lung clear. Heart is normal size. No acute bony abnormality. IMPRESSION: Airspace disease throughout the right lung with small right effusion. Findings concerning for pneumonia. Volume loss on the right. Electronically Signed   By: Rolm Baptise M.D.   On: 08/06/2019 12:26    Labs:  CBC: Recent Labs    08/11/19 0547 08/12/19 0539 09/01/19 0844 09/04/19 1210  WBC 13.3* 15.3* 8.3 27.4*  HGB 8.5* 8.5* 8.7* 8.8*  HCT 28.5* 27.8* 29.3* 29.4*  PLT 268 278 217 171    COAGS: Recent Labs    08/07/19 1927  INR 1.1  APTT 33    BMP: Recent Labs    08/10/19 0244 08/11/19 0547 08/12/19 0539 09/01/19 0844  NA 139 136 132* 140  K 3.9 4.2 4.0 4.5  CL 101 99 96* 103  CO2 29 29 26 26   GLUCOSE 104* 105* 107* 148*  BUN 18 14 12 16   CALCIUM 8.3* 8.5* 8.3* 9.0  CREATININE 0.71 0.72 0.74 0.79  GFRNONAA >60 >60 >60 >60  GFRAA >60 >60 >60 >60    LIVER FUNCTION TESTS: Recent Labs    08/06/19 1211 08/06/19 1211 08/07/19 1927 08/08/19 0432 08/10/19 0244 08/11/19 0547 08/12/19 0539 09/01/19 0844  BILITOT 0.6  --  0.5   --   --   --   --  0.3  AST 15  --  15  --   --   --   --  10*  ALT 25  --  22  --   --   --   --  10  ALKPHOS 59  --  54  --   --   --   --  70  PROT 7.7  --  6.2*  --   --   --   --  7.4  ALBUMIN 3.0*   < > 2.2*   < > 2.0* 2.5* 2.4* 2.6*   < > = values in this interval not displayed.     Assessment and Plan:  Lung cancer (stage IIIC/IV non-small cell (SCC)) s/p radiation therapy, with tentative plans to begin systemic chemotherapy as management. Plan for image-guided Port-a-cath placement today in IR. Patient is NPO. Afebrile. WBCs elevated to 27.4 today- chart check revealed no steroid use, patient grossly asymptomatic and afebrile at this time. Discussed with Dr. Laurence Ferrari who states leukocytosis probably reactive from recent radiation therapy, and given lack of symptoms/fever, ok to proceed today. He does not take blood thinners.  Risks and benefits of image-guided Port-a-catheter placement were discussed with the patient including, but not limited to bleeding, infection, pneumothorax, or fibrin sheath development and need for additional procedures. All of the patient's questions were answered, patient is agreeable to proceed. Consent signed and in chart.   Thank you for this interesting consult.  I greatly enjoyed meeting Jeremy [name] and look forward to participating in their care.  A copy of this report was sent to the requesting provider on this date.  Electronically Signed: Earley Abide, PA-C 09/04/2019, 12:43 PM   I spent a total of 30 Minutes in face to face in clinical consultation, greater than 50% of which was counseling/coordinating care for lung cancer/Port-a-cath placement.

## 2019-09-04 NOTE — Procedures (Signed)
Interventional Radiology Procedure Note  Procedure: Placement of a right IJ approach single lumen PowerPort.  Tip is positioned at the superior cavoatrial junction and catheter is ready for immediate use.  Complications: No immediate Recommendations:  - Ok to shower tomorrow - Do not submerge for 7 days - Routine line care   Signed,  Priyansh Pry K. Jailan Trimm, MD   

## 2019-09-07 NOTE — Progress Notes (Signed)
[facility] OFFICE PROGRESS NOTE  [name], [name], MD [address] 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  PDL1 expression 20%.   PRIOR THERAPY: Palliative radiotherapy under the care of Dr. Sondra Come to the obstructive right lower lobe lung mass.  CURRENT THERAPY: Systemic chemotherapy with carboplatin for AUC of 5, paclitaxel 175 mg/M2 and Keytruda 200 mg IV every 3 weeks with Neulasta support.  First dose September 02, 2019. Status post 1 cycle.   INTERVAL HISTORY: Jeremy [name] 74 y.o. male returns to the clinic for a follow up visit. The patient is feeling well today without any concerning complaints. The patient tolerated his first cycle of chemotherapy well without any concerning complaints except he developed atrial fibrillation with RVR following his first cycle of treatment.  The patient was symptomatic and felt palpitations.  His symptoms lasted approximately 30 to 40 minutes.  He called EMS and had converted to normal sinus rhythm upon making it to the emergency room.  He was given diltiazem.  The patient had one prior episode of atrial fibrillation when he was undergoing his bronchoscopy for tissue diagnosis of his malignancy.  The patient is scheduled to see cardiologist on 09/18/2019 in [place].    Otherwise, he denies any fever, chills, or night sweats.  He had been losing a significant amount of weight since being first diagnosed.  He lost approximately 2 pounds since his last appointment, although he notes that his appetite is picking up.  He drinks 1-2 ensures per day.  He is scheduled to meet with a member of the nutritionist team during his next infusion. denies any chest pain or hemoptysis.  He reports his baseline dyspnea on exertion and cough; however, he did note that this is improving compared to prior.   He had been discharged on oxygen from the  hospital previously and has been needing to use his oxygen less and less.  Denies any nausea, vomiting, diarrhea, or constipation. Denies any headache or visual changes. Denies any rashes or skin changes. He recently had his initial staging PET scan performed. The patient is here today for evaluation, for a one week follow up visit, and to review his staging PET scan.     MEDICAL HISTORY: Past Medical History:  Diagnosis Date  . COPD (chronic obstructive pulmonary disease) (HCC)    QUIT SMOKING 30 YRS AGO  . Diabetes ([place]) 2018  . HTN (hypertension)   . Psoriatic arthritis ([place]) 2015    ALLERGIES:  is allergic to aleve [naproxen sodium].  MEDICATIONS:  Current Outpatient Medications  Medication Sig Dispense Refill  . acetaminophen (TYLENOL) 500 MG tablet Take 500 mg by mouth every 4 (four) hours as needed (for pain.).    Marland Kitchen Ascorbic Acid (VITAMIN C) 1000 MG tablet Take 1,000 mg by mouth daily.    Marland Kitchen docusate sodium (COLACE) 100 MG capsule Take 100 mg by mouth 2 (two) times daily.    Marland Kitchen doxycycline (MONODOX) 50 MG capsule Take 50 mg by mouth 2 (two) times daily as needed (rosacea flare ups.).    Marland Kitchen ferrous sulfate 325 (65 FE) MG tablet Take 325 mg by mouth daily with supper.    . folic acid (FOLVITE) 1 MG tablet Take 1 mg by mouth daily.    Marland Kitchen gabapentin (NEURONTIN) 300 MG capsule Take 600 mg by mouth at bedtime.    . lidocaine-prilocaine (EMLA) cream Apply to  the Port-A-Cath site 30 minutes before treatment 30 g 0  . metFORMIN (GLUCOPHAGE-XR) 500 MG 24 hr tablet Take 500-1,000 mg by mouth See admin instructions. Take 2 tablets (1000 mg) by mouth in the morning & 1 tablet (500 mg) by mouth at night.    . metroNIDAZOLE (METROCREAM) 0.75 % cream Apply 1 application topically 2 (two) times daily as needed (facial rosacea).     . mometasone (ELOCON) 0.1 % cream Apply 1 application topically 2 (two) times daily as needed (psoriasis).     . Omega-3 Fatty Acids (FISH OIL) 1000 MG CAPS Take 1,000 mg  by mouth daily.     . pantoprazole (PROTONIX) 40 MG tablet 1 po 30 mins prior to first meal 30 tablet 11  . prochlorperazine (COMPAZINE) 10 MG tablet Take 1 tablet (10 mg total) by mouth every 6 (six) hours as needed for nausea or vomiting. 30 tablet 0  . traMADol (ULTRAM) 50 MG tablet Take 50 mg by mouth every 4 (four) hours as needed. for pain    . umeclidinium-vilanterol (ANORO ELLIPTA) 62.5-25 MCG/INH AEPB Inhale 1 puff into the lungs daily. 30 each 0  . albuterol (VENTOLIN HFA) 108 (90 Base) MCG/ACT inhaler Inhale 2 puffs into the lungs every 6 (six) hours as needed for wheezing or shortness of breath.    . cyclobenzaprine (FLEXERIL) 10 MG tablet Take 10 mg by mouth at bedtime.    Marland Kitchen diltiazem (CARDIZEM) 30 MG tablet Take 1 tablet (30 mg total) by mouth as needed. (Patient not taking: Reported on 09/08/2019) 30 tablet 0  . FLOVENT HFA 110 MCG/ACT inhaler 1 puff 2 (two) times daily.     No current facility-administered medications for this visit.    SURGICAL HISTORY:  Past Surgical History:  Procedure Laterality Date  . BACK SURGERY  1990  . BIOPSY  12/30/2018   Procedure: BIOPSY;  Surgeon: Danie Binder, MD;  [facility];  Service: Endoscopy;;  gastric  . CATARACT EXTRACTION Bilateral   . CERVICAL SPINE SURGERY  1995  . COLONOSCOPY WITH PROPOFOL N/A 12/30/2018   Procedure: COLONOSCOPY WITH PROPOFOL;  Surgeon: Danie Binder, MD;  Location: [facility];  Service: Endoscopy;  Laterality: N/A;  12:30pm  . ESOPHAGOGASTRODUODENOSCOPY (EGD) WITH PROPOFOL N/A 12/30/2018   Procedure: ESOPHAGOGASTRODUODENOSCOPY (EGD) WITH PROPOFOL;  Surgeon: Danie Binder, MD;  [facility];  Service: Endoscopy;  Laterality: N/A;  . IR IMAGING GUIDED PORT INSERTION  09/04/2019  . POLYPECTOMY  12/30/2018   Procedure: POLYPECTOMY;  Surgeon: Danie Binder, MD;  Location[facility];  Service: Endoscopy;;  colon  . THORACENTESIS Right 08/08/2019   Procedure: Thoracentesis;  Surgeon:  [name], MD;  Location: [facility];  Service: Pulmonary;  Laterality: Right;  Marland Kitchen VIDEO BRONCHOSCOPY Right 08/08/2019   Procedure: Video Bronchoscopy with Erbe Cryo Biopsy of Right mainstem;  Surgeon: [name], MD;  Location[facility];  Service: Pulmonary;  Laterality: Right;    REVIEW OF SYSTEMS:   Review of Systems  Constitutional: Positive for fatigue.  Positive for 2 pound weight loss. Negative for appetite change, chills, and fever.  HENT: Negative for mouth sores, nosebleeds, sore throat and trouble swallowing.   Eyes: Negative for eye problems and icterus.  Respiratory: Positive for baseline dyspnea on exertion and cough (improved from prior) negative for hemoptysis and wheezing.  Cardiovascular: Negative for chest pain and leg swelling.  Gastrointestinal: Negative for abdominal pain, constipation, diarrhea, nausea and vomiting.  Genitourinary: Negative  for bladder incontinence, difficulty urinating, dysuria, frequency and hematuria.   Musculoskeletal: Negative for back pain, gait problem, neck pain and neck stiffness.  Skin: Negative for itching and rash.  Neurological: Negative for dizziness, extremity weakness, gait problem, headaches, light-headedness and seizures.  Hematological: Negative for adenopathy. Does not bruise/bleed easily.  Psychiatric/Behavioral: Negative for confusion, depression and sleep disturbance. The patient is not nervous/anxious.     PHYSICAL EXAMINATION:  Blood pressure 121/76, pulse 98, temperature 98.3 F (36.8 C), temperature source Temporal, resp. rate 18, height '6\' 2"'  (1.88 m), weight 162 lb 8 oz (73.7 kg), SpO2 97 %.  ECOG PERFORMANCE STATUS: 1 - Symptomatic but completely ambulatory  Physical Exam  Constitutional: Oriented to person, place, and time and well-developed, well-nourished, and in no distress.  HENT:  Head: Normocephalic and atraumatic.  Mouth/Throat: Oropharynx is clear and moist. No oropharyngeal exudate.  Eyes: Conjunctivae are  normal. Right eye exhibits no discharge. Left eye exhibits no discharge. No scleral icterus.  Neck: Normal range of motion. Neck supple.  Cardiovascular: Normal rate, regular rhythm, normal heart sounds and intact distal pulses.   Pulmonary/Chest: Effort normal.  Diminished breath sounds on the right side lower lung field. no respiratory distress. No rales.  Abdominal: Soft. Bowel sounds are normal. Exhibits no distension and no mass. There is no tenderness.  Musculoskeletal: Normal range of motion. Exhibits no edema.  Lymphadenopathy:    No cervical adenopathy.  Neurological: Alert and oriented to person, place, and time. Exhibits normal muscle tone. Gait normal. Coordination normal.  Skin: Skin is warm and dry. No rash noted. Not diaphoretic. No erythema. No pallor.  Psychiatric: Mood, memory and judgment normal.  Vitals reviewed.  LABORATORY DATA: Lab Results  Component Value Date   WBC 20.9 (H) 09/08/2019   HGB 8.2 (L) 09/08/2019   HCT 27.1 (L) 09/08/2019   MCV 90.3 09/08/2019   PLT 148 (L) 09/08/2019      Chemistry      Component Value Date/Time   NA 143 09/08/2019 1421   K 4.4 09/08/2019 1421   CL 103 09/08/2019 1421   CO2 27 09/08/2019 1421   BUN 18 09/08/2019 1421   CREATININE 0.72 09/08/2019 1421      Component Value Date/Time   CALCIUM 9.1 09/08/2019 1421   ALKPHOS 129 (H) 09/08/2019 1421   AST 14 (L) 09/08/2019 1421   ALT 20 09/08/2019 1421   BILITOT 0.3 09/08/2019 1421       RADIOGRAPHIC STUDIES:  MR BRAIN W WO CONTRAST  Result Date: 08/10/2019 CLINICAL DATA:  Lung cancer, staging EXAM: MRI HEAD WITHOUT AND WITH CONTRAST TECHNIQUE: Multiplanar, multiecho pulse sequences of the brain and surrounding structures were obtained without and with intravenous contrast. CONTRAST:  34m GADAVIST GADOBUTROL 1 MMOL/ML IV SOLN COMPARISON:  None. FINDINGS: Brain: There is no acute infarction or intracranial hemorrhage. There is no intracranial mass, mass effect, or edema.  There is no hydrocephalus or extra-axial fluid collection. Prominence of the ventricles and sulci reflects mild generalized parenchymal volume loss. Patchy foci of T2 hyperintensity in the supratentorial and pontine white matter are nonspecific but may reflect mild chronic microvascular ischemic changes. Incidental note is made of a small cerebellar developmental venous anomaly. Vascular: Major vessel flow voids at the skull base are preserved. Skull and upper cervical spine: Normal marrow signal is preserved. Sinuses/Orbits: Mild mucosal thickening. Bilateral lens replacements. Other: Sella is unremarkable.  Mastoid air cells are clear. IMPRESSION: No evidence of intracranial metastatic disease. Mild chronic microvascular ischemic changes.  Electronically Signed   By: Macy Mis M.D.   On: 08/10/2019 12:52   CT ABDOMEN PELVIS W CONTRAST  Result Date: 08/11/2019 CLINICAL DATA:  Lung cancer assess for metastatic disease EXAM: CT ABDOMEN AND PELVIS WITH CONTRAST TECHNIQUE: Multidetector CT imaging of the abdomen and pelvis was performed using the standard protocol following bolus administration of intravenous contrast. CONTRAST:  132m OMNIPAQUE IOHEXOL 300 MG/ML  SOLN COMPARISON:  CT chest 08/06/2019, MRI 06/03/2019, 08/10/2019 FINDINGS: Lower chest: Small right-sided pleural effusion with volume loss in the right thorax and shift of mediastinal contents to the right. Incompletely visualized necrotic lung mass in the right lower lobe. Normal heart size. Trace pericardial effusion. Subcentimeter right cardio phrenic lymph nodes. Hepatobiliary: No focal hepatic abnormality. No calcified gallstone or biliary dilatation. Pancreas: Unremarkable. No pancreatic ductal dilatation or surrounding inflammatory changes. Spleen: Normal in size without focal abnormality. Adrenals/Urinary Tract: Right adrenal gland is normal. Nodular thickening of left adrenal gland without discrete mass. The kidneys show no hydronephrosis.  Subcentimeter hypodensity in the mid left kidney, too small to further characterize. The bladder is normal. Stomach/Bowel: Stomach is within normal limits. Appendix appears normal. No evidence of bowel wall thickening, distention, or inflammatory changes. Scattered diverticula in the sigmoid colon without acute inflammatory change Vascular/Lymphatic: Moderate aortic atherosclerosis. Tortuous aorta with focally ectatic infrarenal aorta up to 2.8 cm. No significant adenopathy. Reproductive: Prostate is unremarkable. Other: Negative for free fluid or free air. Small fat containing right inguinal hernia. Musculoskeletal: No acute or suspicious osseous abnormality. Degenerative changes most advanced at L4-L5. IMPRESSION: 1. Small right-sided pleural effusion with incompletely visualized necrotic lung mass in the right lower lobe. Volume loss on the right with shift of mediastinal contents to the right. 2. Otherwise negative CT examination of the abdomen and pelvis. No definitive evidence for metastatic disease Electronically Signed   By: KDonavan FoilM.D.   On: 08/11/2019 19:52   NM PET Image Initial (PI) Skull Base To Thigh  Result Date: 09/08/2019 CLINICAL DATA:  Initial treatment strategy for non-small-cell lung cancer, staging. COVID-19 vaccine 3 weeks ago. Radiation therapy in March. EXAM: NUCLEAR MEDICINE PET SKULL BASE TO THIGH TECHNIQUE: 8.3 mCi F-18 FDG was injected intravenously. Full-ring PET imaging was performed from the skull base to thigh after the radiotracer. CT data was obtained and used for attenuation correction and anatomic localization. Fasting blood glucose: 84 mg/dl COMPARISON:  CT chest 08/06/2019.  Abdominopelvic CT 321. FINDINGS: Mediastinal blood pool activity: SUV max 2.0 Liver activity: SUV max NA NECK: No areas of abnormal hypermetabolism. Incidental CT findings: No cervical adenopathy. CHEST: No thoracic nodal hypermetabolism. Multifocal esophageal hypermetabolism may be radiation  induced. Example at a S.U.V. max of 5.4 including on 82/4. Significant improvement since 08/06/2019 in previously described necrotic central right lower lobe lung mass. Residual peripherally hypermetabolic mass measures on the order of 6.2 x 4.7 cm and a S.U.V. max of 4.4 including on 94/4. Direct mediastinal extension has improved. Hypermetabolism corresponding to inferior right upper lobe nodularity and interstitial thickening. Example hypermetabolic nodule at 5 mm and a S.U.V. max of 2.8 on 90/4. Incidental CT findings: Right Port-A-Cath tip low SVC. Aortic and coronary artery atherosclerosis. Small right pleural effusion persists. Centrilobular emphysema. Left lower lobe 4 mm nodule anteriorly on 34/8, present previously. ABDOMEN/PELVIS: No abdominopelvic parenchymal or nodal hypermetabolism. Incidental CT findings: Normal adrenal glands. Large colonic stool burden. Abdominal aortic atherosclerosis. Extensive colonic diverticulosis. SKELETON: There is diffuse marrow hypermetabolism, save relative photopenia at the level of mid  to lower thoracic radiation exposure. Incidental CT findings: none IMPRESSION: 1. Since the CT of 08/06/2019, response to therapy of central right lower lobe lung mass. Persistent hypermetabolic anterior right lung base nodularity, which remains suspicious for metastatic disease. 2. No thoracic nodal hypermetabolism. 3. Esophageal hypermetabolism, possibly representing radiation induced esophagitis. 4. Diffuse marrow hypermetabolism, presumably due to marrow stimulation by chemotherapy (pharmacy note describes chemotherapy beginning last week). This limits sensitivity for osseous metastasis. 5. Incidental findings, including: Similar small right pleural effusion. Aortic atherosclerosis (ICD10-I70.0), coronary artery atherosclerosis and emphysema (ICD10-J43.9). Possible constipation. Electronically Signed   By: Abigail Miyamoto M.D.   On: 09/08/2019 14:39   IR IMAGING GUIDED PORT  INSERTION  Result Date: 09/04/2019 INDICATION: 74 year old male with stage IV squamous cell carcinoma of the right lung. He presents for port catheter placement EXAM: IMPLANTED PORT A CATH PLACEMENT WITH ULTRASOUND AND FLUOROSCOPIC GUIDANCE MEDICATIONS: 2 g Ancef; The antibiotic was administered within an appropriate time interval prior to skin puncture. ANESTHESIA/SEDATION: Versed 2 mg IV; Fentanyl 100 mcg IV; Moderate Sedation Time:  26 minutes The patient was continuously monitored during the procedure by the interventional radiology nurse under my direct supervision. FLUOROSCOPY TIME:  1 minutes, 30 seconds (12 mGy) COMPLICATIONS: None immediate. PROCEDURE: The right neck and chest was prepped with chlorhexidine, and draped in the usual sterile fashion using maximum barrier technique (cap and mask, sterile gown, sterile gloves, large sterile sheet, hand hygiene and cutaneous antiseptic). Local anesthesia was attained by infiltration with 1% lidocaine with epinephrine. Ultrasound demonstrated patency of the right internal jugular vein, and this was documented with an image. Under real-time ultrasound guidance, this vein was accessed with a 21 gauge micropuncture needle and image documentation was performed. A small dermatotomy was made at the access site with an 11 scalpel. A 0.018" wire was advanced into the SVC and the access needle exchanged for a 69F micropuncture vascular sheath. The 0.018" wire was then removed and a 0.035" wire advanced into the IVC. An appropriate location for the subcutaneous reservoir was selected below the clavicle and an incision was made through the skin and underlying soft tissues. The subcutaneous tissues were then dissected using a combination of blunt and sharp surgical technique and a pocket was formed. A single lumen power injectable portacatheter was then tunneled through the subcutaneous tissues from the pocket to the dermatotomy and the port reservoir placed within the  subcutaneous pocket. The venous access site was then serially dilated and a peel away vascular sheath placed over the wire. The wire was removed and the port catheter advanced into position under fluoroscopic guidance. The catheter tip is positioned in the superior cavoatrial junction. This was documented with a spot image. The portacatheter was then tested and found to flush and aspirate well. The port was flushed with saline followed by 100 units/mL heparinized saline. The pocket was then closed in two layers using first subdermal inverted interrupted absorbable sutures followed by a running subcuticular suture. The epidermis was then sealed with Dermabond. The dermatotomy at the venous access site was also closed with Dermabond. IMPRESSION: Successful placement of a right IJ approach Power Port with ultrasound and fluoroscopic guidance. The catheter is ready for use. Electronically Signed   By: Jacqulynn Cadet M.D.   On: 09/04/2019 16:42     ASSESSMENT/PLAN:  This is a very pleasant 74 year old Caucasian male with stage IIIc/IV (T4, N2, M0/M1 a) non-small cell lung cancer, squamous cell carcinoma. He presented with large necrotic mass extending to  the carina with associated obstruction of the right lower lobe bronchus and postobstructive collapse/consolidation and necrosis in the right lower lobe as well as mediastinal lymphadenopathy with 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    The patient has PD-L1 expression of 20%.  The patient completed? Palliative radiotherapy to the obstructed right lower lobe lung mass under the care of Dr. Sondra Come. Last treatment on 08/28/2019  The patient is currently undergoing systemic chemotherapy with carboplatin for an AUC of 5, paclitaxel 175 mg/m2, and Ketyruda 200 mg IV every 3 weeks. He is status post his first cycle of treatment.   The patient had his initial staging PET scan this morning. Dr. Julien Nordmann personally and independently  reviewed the scan and discussed the results with the patient. The scan showed central right lower lobe lung mass, anterior right lung base nodularity, which remains suspicious for metastatic disease.   Recommend that the patient continue on the same treatment at the same dose.  The patient's hemoglobin was 8.2 today.  Discussed the possibility of needing a blood transfusion in the near future.  I will add a sample to blood bank to be added to his routine labs next week.  We will see him back for a follow up visit in 2 weeks for evaluation before starting cycle #2.   Encourage the patient to follow-up with cardiology as planned.  Dr. Julien Nordmann discussed the plan is to continue on the same treatment.   The patient was advised to call immediately if he has any concerning symptoms in the interval. The patient voices understanding of current disease status and treatment options and is in agreement with the current care plan. All questions were answered. The patient knows to call the clinic with any problems, questions or concerns. We can certainly see the patient much sooner if necessary  Orders Placed This Encounter  Procedures  . Sample to Blood Bank    Standing Status:   Future    Standing Expiration Date:   09/07/2020     Tobe Sos Heilingoetter, PA-C 09/08/19  ADDENDUM: Hematology/Oncology Attending: I had a face-to-face encounter with the patient today.  I recommended his care plan.  This is a very pleasant 74 years old white male with a stage IV non-small cell lung cancer, squamous cell carcinoma.  He is currently undergoing systemic chemotherapy with carboplatin, paclitaxel and Keytruda status post 1 cycle. The patient tolerated the first cycle of his treatment fairly well except for mild fatigue and one episode of atrial fibrillation that spontaneously resolved.  We encouraged him to see cardiology for evaluation of this condition. He had a PET scan performed earlier today.  I personally  and independently reviewed the scan images and discussed the result and showed the images to the patient and his wife. I recommended for the patient to continue his current treatment with the same regimen.  He will start cycle #2 in 2 weeks. For the anemia of neoplastic disease, will monitor his hemoglobin and hematocrit closely and consider the patient for PRBCs transfusion if his hemoglobin is less than 8.0G/DL. The patient was advised to call immediately if he has any other concerning symptoms in the interval.  Disclaimer: This note was dictated with voice recognition software. Similar sounding words can inadvertently be transcribed and may be missed upon review. Eilleen Kempf, MD 09/08/19

## 2019-09-08 ENCOUNTER — Other Ambulatory Visit: Payer: Self-pay

## 2019-09-08 ENCOUNTER — Inpatient Hospital Stay: Payer: Medicare Other

## 2019-09-08 ENCOUNTER — Encounter: Payer: Self-pay | Admitting: Physician Assistant

## 2019-09-08 ENCOUNTER — Ambulatory Visit (HOSPITAL_COMMUNITY)
Admission: RE | Admit: 2019-09-08 | Discharge: 2019-09-08 | Disposition: A | Discharge status: Home / Self Care | Payer: Medicare Other | Source: Ambulatory Visit | Attending: Internal Medicine | Admitting: Internal Medicine

## 2019-09-08 ENCOUNTER — Inpatient Hospital Stay: Payer: Medicare Other | Attending: Internal Medicine | Admitting: Physician Assistant

## 2019-09-08 VITALS — BP 121/76 | HR 98 | Temp 98.3°F | Resp 18 | Ht 74.0 in | Wt 162.5 lb

## 2019-09-08 DIAGNOSIS — Z7952 Long term (current) use of systemic steroids: Secondary | ICD-10-CM | POA: Insufficient documentation

## 2019-09-08 DIAGNOSIS — J449 Chronic obstructive pulmonary disease, unspecified: Secondary | ICD-10-CM | POA: Diagnosis not present

## 2019-09-08 DIAGNOSIS — I700 Atherosclerosis of aorta: Secondary | ICD-10-CM | POA: Insufficient documentation

## 2019-09-08 DIAGNOSIS — Z452 Encounter for adjustment and management of vascular access device: Secondary | ICD-10-CM | POA: Diagnosis not present

## 2019-09-08 DIAGNOSIS — D630 Anemia in neoplastic disease: Secondary | ICD-10-CM | POA: Diagnosis not present

## 2019-09-08 DIAGNOSIS — I2510 Atherosclerotic heart disease of native coronary artery without angina pectoris: Secondary | ICD-10-CM | POA: Diagnosis not present

## 2019-09-08 DIAGNOSIS — R002 Palpitations: Secondary | ICD-10-CM | POA: Diagnosis not present

## 2019-09-08 DIAGNOSIS — J439 Emphysema, unspecified: Secondary | ICD-10-CM | POA: Insufficient documentation

## 2019-09-08 DIAGNOSIS — T451X5A Adverse effect of antineoplastic and immunosuppressive drugs, initial encounter: Secondary | ICD-10-CM | POA: Diagnosis not present

## 2019-09-08 DIAGNOSIS — C3490 Malignant neoplasm of unspecified part of unspecified bronchus or lung: Secondary | ICD-10-CM | POA: Insufficient documentation

## 2019-09-08 DIAGNOSIS — D6481 Anemia due to antineoplastic chemotherapy: Secondary | ICD-10-CM | POA: Diagnosis not present

## 2019-09-08 DIAGNOSIS — C3431 Malignant neoplasm of lower lobe, right bronchus or lung: Secondary | ICD-10-CM | POA: Insufficient documentation

## 2019-09-08 DIAGNOSIS — R5383 Other fatigue: Secondary | ICD-10-CM | POA: Insufficient documentation

## 2019-09-08 DIAGNOSIS — Z79899 Other long term (current) drug therapy: Secondary | ICD-10-CM | POA: Insufficient documentation

## 2019-09-08 DIAGNOSIS — Z7984 Long term (current) use of oral hypoglycemic drugs: Secondary | ICD-10-CM | POA: Diagnosis not present

## 2019-09-08 DIAGNOSIS — E1136 Type 2 diabetes mellitus with diabetic cataract: Secondary | ICD-10-CM | POA: Diagnosis not present

## 2019-09-08 DIAGNOSIS — Z7951 Long term (current) use of inhaled steroids: Secondary | ICD-10-CM | POA: Insufficient documentation

## 2019-09-08 DIAGNOSIS — Z7689 Persons encountering health services in other specified circumstances: Secondary | ICD-10-CM | POA: Diagnosis not present

## 2019-09-08 DIAGNOSIS — C3491 Malignant neoplasm of unspecified part of right bronchus or lung: Secondary | ICD-10-CM

## 2019-09-08 DIAGNOSIS — Z87891 Personal history of nicotine dependence: Secondary | ICD-10-CM | POA: Insufficient documentation

## 2019-09-08 DIAGNOSIS — I4891 Unspecified atrial fibrillation: Secondary | ICD-10-CM | POA: Insufficient documentation

## 2019-09-08 DIAGNOSIS — L270 Generalized skin eruption due to drugs and medicaments taken internally: Secondary | ICD-10-CM | POA: Diagnosis not present

## 2019-09-08 DIAGNOSIS — J910 Malignant pleural effusion: Secondary | ICD-10-CM | POA: Diagnosis not present

## 2019-09-08 DIAGNOSIS — I10 Essential (primary) hypertension: Secondary | ICD-10-CM | POA: Diagnosis not present

## 2019-09-08 DIAGNOSIS — J90 Pleural effusion, not elsewhere classified: Secondary | ICD-10-CM | POA: Insufficient documentation

## 2019-09-08 DIAGNOSIS — Z95828 Presence of other vascular implants and grafts: Secondary | ICD-10-CM

## 2019-09-08 DIAGNOSIS — Z5111 Encounter for antineoplastic chemotherapy: Secondary | ICD-10-CM | POA: Diagnosis not present

## 2019-09-08 LAB — CBC WITH DIFFERENTIAL (CANCER CENTER ONLY)
Abs Immature Granulocytes: 1.58 10*3/uL — ABNORMAL HIGH (ref 0.00–0.07)
Basophils Absolute: 0 10*3/uL (ref 0.0–0.1)
Basophils Relative: 0 %
Eosinophils Absolute: 0.4 10*3/uL (ref 0.0–0.5)
Eosinophils Relative: 2 %
HCT: 27.1 % — ABNORMAL LOW (ref 39.0–52.0)
Hemoglobin: 8.2 g/dL — ABNORMAL LOW (ref 13.0–17.0)
Immature Granulocytes: 8 %
Lymphocytes Relative: 5 %
Lymphs Abs: 0.9 10*3/uL (ref 0.7–4.0)
MCH: 27.3 pg (ref 26.0–34.0)
MCHC: 30.3 g/dL (ref 30.0–36.0)
MCV: 90.3 fL (ref 80.0–100.0)
Monocytes Absolute: 2 10*3/uL — ABNORMAL HIGH (ref 0.1–1.0)
Monocytes Relative: 10 %
Neutro Abs: 16 10*3/uL — ABNORMAL HIGH (ref 1.7–7.7)
Neutrophils Relative %: 75 %
Platelet Count: 148 10*3/uL — ABNORMAL LOW (ref 150–400)
RBC: 3 MIL/uL — ABNORMAL LOW (ref 4.22–5.81)
RDW: 19.1 % — ABNORMAL HIGH (ref 11.5–15.5)
WBC Count: 20.9 10*3/uL — ABNORMAL HIGH (ref 4.0–10.5)
nRBC: 0.2 % (ref 0.0–0.2)

## 2019-09-08 LAB — FUNGUS CULTURE RESULT

## 2019-09-08 LAB — CMP (CANCER CENTER ONLY)
ALT: 20 U/L (ref 0–44)
AST: 14 U/L — ABNORMAL LOW (ref 15–41)
Albumin: 2.9 g/dL — ABNORMAL LOW (ref 3.5–5.0)
Alkaline Phosphatase: 129 U/L — ABNORMAL HIGH (ref 38–126)
Anion gap: 13 (ref 5–15)
BUN: 18 mg/dL (ref 8–23)
CO2: 27 mmol/L (ref 22–32)
Calcium: 9.1 mg/dL (ref 8.9–10.3)
Chloride: 103 mmol/L (ref 98–111)
Creatinine: 0.72 mg/dL (ref 0.61–1.24)
GFR, Est AFR Am: >60 mL/min (ref >60)
GFR, Estimated: >60 mL/min (ref >60)
Glucose, Bld: 105 mg/dL — ABNORMAL HIGH (ref 70–99)
Potassium: 4.4 mmol/L (ref 3.5–5.1)
Sodium: 143 mmol/L (ref 135–145)
Total Bilirubin: 0.3 mg/dL (ref 0.3–1.2)
Total Protein: 7.1 g/dL (ref 6.5–8.1)

## 2019-09-08 LAB — GLUCOSE, CAPILLARY: Glucose-Capillary: 84 mg/dL (ref 70–99)

## 2019-09-08 LAB — FUNGUS CULTURE WITH STAIN

## 2019-09-08 LAB — FUNGAL ORGANISM REFLEX

## 2019-09-08 MED ORDER — HEPARIN SOD (PORK) LOCK FLUSH 100 UNIT/ML IV SOLN
500.0000 [IU] | Freq: Once | INTRAVENOUS | Status: AC
  Administered 2019-09-08: 500 [IU] via INTRAVENOUS
  Filled 2019-09-08: qty 5

## 2019-09-08 MED ORDER — FLUDEOXYGLUCOSE F - 18 (FDG) INJECTION
8.3000 | Freq: Once | INTRAVENOUS | Status: AC | PRN
  Administered 2019-09-08: 12:00:00 8.3 via INTRAVENOUS

## 2019-09-08 MED ORDER — SODIUM CHLORIDE 0.9% FLUSH
10.0000 mL | INTRAVENOUS | Status: DC | PRN
  Administered 2019-09-08: 10 mL via INTRAVENOUS
  Filled 2019-09-08: qty 10

## 2019-09-11 DIAGNOSIS — R0602 Shortness of breath: Secondary | ICD-10-CM | POA: Diagnosis not present

## 2019-09-11 DIAGNOSIS — D509 Iron deficiency anemia, unspecified: Secondary | ICD-10-CM | POA: Diagnosis not present

## 2019-09-11 DIAGNOSIS — C3490 Malignant neoplasm of unspecified part of unspecified bronchus or lung: Secondary | ICD-10-CM | POA: Diagnosis not present

## 2019-09-11 DIAGNOSIS — R634 Abnormal weight loss: Secondary | ICD-10-CM | POA: Diagnosis not present

## 2019-09-11 DIAGNOSIS — J439 Emphysema, unspecified: Secondary | ICD-10-CM | POA: Diagnosis not present

## 2019-09-15 ENCOUNTER — Other Ambulatory Visit: Payer: Self-pay

## 2019-09-15 ENCOUNTER — Inpatient Hospital Stay (HOSPITAL_BASED_OUTPATIENT_CLINIC_OR_DEPARTMENT_OTHER): Payer: Medicare Other | Admitting: Medical

## 2019-09-15 ENCOUNTER — Inpatient Hospital Stay: Payer: Medicare Other

## 2019-09-15 ENCOUNTER — Telehealth: Payer: Self-pay | Admitting: Medical Oncology

## 2019-09-15 VITALS — BP 122/76 | HR 95 | Temp 98.2°F | Resp 18 | Ht 74.0 in | Wt 169.3 lb

## 2019-09-15 DIAGNOSIS — C3491 Malignant neoplasm of unspecified part of right bronchus or lung: Secondary | ICD-10-CM

## 2019-09-15 DIAGNOSIS — L270 Generalized skin eruption due to drugs and medicaments taken internally: Secondary | ICD-10-CM | POA: Diagnosis not present

## 2019-09-15 DIAGNOSIS — J910 Malignant pleural effusion: Secondary | ICD-10-CM | POA: Diagnosis not present

## 2019-09-15 DIAGNOSIS — Z95828 Presence of other vascular implants and grafts: Secondary | ICD-10-CM

## 2019-09-15 DIAGNOSIS — D649 Anemia, unspecified: Secondary | ICD-10-CM | POA: Diagnosis not present

## 2019-09-15 DIAGNOSIS — D630 Anemia in neoplastic disease: Secondary | ICD-10-CM | POA: Diagnosis not present

## 2019-09-15 DIAGNOSIS — D6481 Anemia due to antineoplastic chemotherapy: Secondary | ICD-10-CM

## 2019-09-15 DIAGNOSIS — T451X5A Adverse effect of antineoplastic and immunosuppressive drugs, initial encounter: Secondary | ICD-10-CM

## 2019-09-15 DIAGNOSIS — Z452 Encounter for adjustment and management of vascular access device: Secondary | ICD-10-CM | POA: Diagnosis not present

## 2019-09-15 DIAGNOSIS — C3431 Malignant neoplasm of lower lobe, right bronchus or lung: Secondary | ICD-10-CM | POA: Diagnosis not present

## 2019-09-15 DIAGNOSIS — Z5111 Encounter for antineoplastic chemotherapy: Secondary | ICD-10-CM | POA: Diagnosis not present

## 2019-09-15 DIAGNOSIS — Z7689 Persons encountering health services in other specified circumstances: Secondary | ICD-10-CM | POA: Diagnosis not present

## 2019-09-15 LAB — CMP (CANCER CENTER ONLY)
ALT: 11 U/L (ref 0–44)
AST: 10 U/L — ABNORMAL LOW (ref 15–41)
Albumin: 2.7 g/dL — ABNORMAL LOW (ref 3.5–5.0)
Alkaline Phosphatase: 97 U/L (ref 38–126)
Anion gap: 8 (ref 5–15)
BUN: 15 mg/dL (ref 8–23)
CO2: 27 mmol/L (ref 22–32)
Calcium: 8.5 mg/dL — ABNORMAL LOW (ref 8.9–10.3)
Chloride: 105 mmol/L (ref 98–111)
Creatinine: 0.69 mg/dL (ref 0.61–1.24)
GFR, Est AFR Am: >60 mL/min (ref >60)
GFR, Estimated: >60 mL/min (ref >60)
Glucose, Bld: 89 mg/dL (ref 70–99)
Potassium: 4.6 mmol/L (ref 3.5–5.1)
Sodium: 140 mmol/L (ref 135–145)
Total Bilirubin: 0.3 mg/dL (ref 0.3–1.2)
Total Protein: 7.1 g/dL (ref 6.5–8.1)

## 2019-09-15 LAB — CBC WITH DIFFERENTIAL (CANCER CENTER ONLY)
Abs Immature Granulocytes: 0.13 10*3/uL — ABNORMAL HIGH (ref 0.00–0.07)
Basophils Absolute: 0 10*3/uL (ref 0.0–0.1)
Basophils Relative: 0 %
Eosinophils Absolute: 0.1 10*3/uL (ref 0.0–0.5)
Eosinophils Relative: 1 %
HCT: 24.7 % — ABNORMAL LOW (ref 39.0–52.0)
Hemoglobin: 7.5 g/dL — ABNORMAL LOW (ref 13.0–17.0)
Immature Granulocytes: 1 %
Lymphocytes Relative: 6 %
Lymphs Abs: 0.6 10*3/uL — ABNORMAL LOW (ref 0.7–4.0)
MCH: 28.1 pg (ref 26.0–34.0)
MCHC: 30.4 g/dL (ref 30.0–36.0)
MCV: 92.5 fL (ref 80.0–100.0)
Monocytes Absolute: 0.6 10*3/uL (ref 0.1–1.0)
Monocytes Relative: 6 %
Neutro Abs: 8.2 10*3/uL — ABNORMAL HIGH (ref 1.7–7.7)
Neutrophils Relative %: 86 %
Platelet Count: 128 10*3/uL — ABNORMAL LOW (ref 150–400)
RBC: 2.67 MIL/uL — ABNORMAL LOW (ref 4.22–5.81)
RDW: 20.6 % — ABNORMAL HIGH (ref 11.5–15.5)
WBC Count: 9.6 10*3/uL (ref 4.0–10.5)
nRBC: 0 % (ref 0.0–0.2)

## 2019-09-15 LAB — SAMPLE TO BLOOD BANK

## 2019-09-15 LAB — PREPARE RBC (CROSSMATCH)

## 2019-09-15 LAB — ABO/RH: ABO/RH(D): A POS

## 2019-09-15 MED ORDER — SODIUM CHLORIDE 0.9% FLUSH
10.0000 mL | Freq: Once | INTRAVENOUS | Status: AC
  Administered 2019-09-15 (×2): 10 mL via INTRAVENOUS
  Filled 2019-09-15: qty 10

## 2019-09-15 MED ORDER — TRIAMCINOLONE ACETONIDE 0.1 % EX LOTN: 1.0000 "application " | TOPICAL_LOTION | Freq: Four times a day (QID) | CUTANEOUS | 1 refills | Status: DC

## 2019-09-15 MED ORDER — HEPARIN SOD (PORK) LOCK FLUSH 100 UNIT/ML IV SOLN
500.0000 [IU] | Freq: Once | INTRAVENOUS | Status: DC
  Administered 2019-09-15: 500 [IU] via INTRAVENOUS
  Filled 2019-09-15: qty 5

## 2019-09-15 NOTE — Telephone Encounter (Signed)
Sat evening - New rash appeared on chest , back and lower legs -rasied in some areas and itchy . 3/29-1st Dor[facility] ,.alimta.  Started Benadryl sat and continuing Claritin after talking to on call providier Per Julien Nordmann pt to see Sandi Mealy in [facility].

## 2019-09-15 NOTE — Progress Notes (Signed)
Pt to return to [facility] at 0900 tomorrow (09/16/19) for 1 unit PRBCs.  Pt aware of appt and to wear blue blood bracelet for transfusion appt.  Wife made aware of plan of care by PA [facility].

## 2019-09-15 NOTE — Progress Notes (Signed)
[facility] Progress Note   Jeremy [name] [id number] 06-11-1945 74 y.o.  [name] is managed by Dr. Fanny Bien. Mohamed  Actively treated with chemotherapy/immunotherapy/hormonal therapy: yes  Current therapy: Systemic chemotherapy with carboplatin for AUC of 5, paclitaxel 175 mg/M2 and Keytruda 200 mg IV every 3 weeks with Neulasta support.  Last treated: September 02, 2019. (cycle 1)   Next scheduled appointment with provider: 09/23/2019  Assessment: Plan:    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HCC)  Symptomatic anemia - Plan: Informed Consent Details: Physician/Practitioner Attestation; Transcribe to consent form and obtain patient signature, Type and screen, Care order/instruction, 0.9 %  sodium chloride infusion (Manually program via Guardrails IV Fluids), heparin lock flush 100 unit/mL, Prepare RBC (crossmatch), Transfuse RBC, diphenhydrAMINE (BENADRYL) capsule 25 mg, acetaminophen (TYLENOL) tablet 650 mg, Type and screen, Prepare RBC (crossmatch)  Drug rash - Plan: triamcinolone lotion (KENALOG) 0.1 %   Stage IV squamous cell carcinoma of the right lung: [name] is status post cycle #1 of carboplatin, paclitaxel, Keytruda, with Neulasta support which was dosed on 09/02/2019. He is scheduled to be seen next on 09/23/2019.  Rash: The patient's rash is related to [facility].  He was given a prescription for triamcinolone lotion 0.1% to be used 4 times daily as needed.  He will continue using Benadryl and Claritin as needed.  Symptomatic anemia: A CBC was completed today with a hemoglobin returning at 7.5.  The patient will return to clinic tomorrow for a transfusion of 1 unit packed red blood cells.  Please see After Visit Summary for patient specific instructions.  Future Appointments  Date Time Provider [place]  09/16/2019  9:00 AM SYMPTOM MANAGEMENT CLINIC 2 [facility] None  09/19/2019 10:40 AM Arnoldo Lenis, MD [facility]    09/23/2019  9:45 AM [facility]/FLUSH [facility] None  09/23/2019 10:00 AM [facility] [place] FLUSH [facility] None  09/23/2019 10:30 AM Heilingoetter, Cassandra L, PA-C [facility] None  09/23/2019 11:30 AM [facility] INFUSION [facility] None  09/23/2019 12:00 PM Ernestene Kiel L, RD [facility] None  09/25/2019 10:00 AM [facility] [place] FLUSH [facility] None  09/29/2019  3:00 PM Gery Pray, MD [facility] None  09/30/2019  9:00 AM [facility]/FLUSH [facility] None  09/30/2019  9:15 AM [facility] [place] FLUSH [facility] None  10/07/2019  9:00 AM [facility]/FLUSH [facility] None  10/07/2019  9:15 AM [facility] [place] FLUSH [facility] None  10/14/2019  9:45 AM [facility]/FLUSH [facility] None  10/14/2019 10:00 AM [facility] [place] None  10/14/2019 10:30 AM Curt Bears, MD [facility] None  10/14/2019 11:30 AM [facility] INFUSION [facility] None  10/16/2019 10:00 AM [facility] [place] FLUSH [facility] None    Orders Placed This Encounter  Procedures  . Informed Consent Details: Physician/Practitioner Attestation; Transcribe to consent form and obtain patient signature  . Care order/instruction  . Type and screen  . Prepare RBC (crossmatch)       Subjective:   Patient ID:  Jeremy [name] is a 74 y.o. (DOB [birth year]) male.  Chief Complaint:  Chief Complaint  Patient presents with  . Rash    HPI Donn Zanetti  is a 74 y.o. male with a diagnosis of a stage IV squamous cell carcinoma of the right lung. He is followed by Dr. Julien Nordmann and is status post cycle #1 of carboplatin, paclitaxel, Keytruda, with Neulasta support which was dosed on 09/02/2019. Mr. Gerry called our office on  09/15/2019 reporting that he had a new pruritic rash on his chest , back and lower  legs. He began Benadryl on Saturday and has added Claritin.  He reports that his energy level is low and that he also has a decrease in his appetite.  He denies pain, bleeding, chest pain, shortness of breath, fevers, chills, sweats, constipation,  diarrhea, nausea, or vomiting.  Medications: I have reviewed the patient's current medications.  Allergies:  Allergies  Allergen Reactions  . Aleve [Naproxen Sodium] Hives and Rash    Past Medical History:  Diagnosis Date  . COPD (chronic obstructive pulmonary disease) (HCC)    QUIT SMOKING 30 YRS AGO  . Diabetes ([place]) 2018  . HTN (hypertension)   . Psoriatic arthritis ([place]) 2015    Past Surgical History:  Procedure Laterality Date  . BACK SURGERY  1990  . BIOPSY  12/30/2018   Procedure: BIOPSY;  Surgeon: Danie Binder, MD;  [facility];  Service: Endoscopy;;  gastric  . CATARACT EXTRACTION Bilateral   . CERVICAL SPINE SURGERY  1995  . COLONOSCOPY WITH PROPOFOL N/A 12/30/2018   Procedure: COLONOSCOPY WITH PROPOFOL;  Surgeon: Danie Binder, MD;  [facility];  Service: Endoscopy;  Laterality: N/A;  12:30pm  . ESOPHAGOGASTRODUODENOSCOPY (EGD) WITH PROPOFOL N/A 12/30/2018   Procedure: ESOPHAGOGASTRODUODENOSCOPY (EGD) WITH PROPOFOL;  Surgeon: Danie Binder, MD;  [facility];  Service: Endoscopy;  Laterality: N/A;  . IR IMAGING GUIDED PORT INSERTION  09/04/2019  . POLYPECTOMY  12/30/2018   Procedure: POLYPECTOMY;  Surgeon: Danie Binder, MD;  [facility];  Service: Endoscopy;;  colon  . THORACENTESIS Right 08/08/2019   Procedure: Thoracentesis;  Surgeon: [name], MD;  Location: [place];  Service: Pulmonary;  Laterality: Right;  Marland Kitchen VIDEO BRONCHOSCOPY Right 08/08/2019   Procedure: Video Bronchoscopy with Erbe Cryo Biopsy of Right mainstem;  Surgeon: [name], MD;  Location: [facility] OR;  Service: Pulmonary;  Laterality: Right;    Family History  Problem Relation Age of Onset  . Alcoholism Father   . CAD Brother   . Diabetes Brother   . Colon cancer Neg Hx   . Colon polyps Neg Hx   . Gastric cancer Neg Hx     Social History   Socioeconomic History  . Marital status: Married    Spouse name: Not on file  . Number of  children: Not on file  . Years of education: Not on file  . Highest education level: Not on file  Occupational History  . Not on file  Tobacco Use  . Smoking status: Former Smoker    Packs/day: 0.50    Years: 50.00    Pack years: 25.00    Types: Cigarettes    Quit date: 12/26/1991    Years since quitting: 27.7  . Smokeless tobacco: Never Used  Substance and Sexual Activity  . Alcohol use: Yes    Comment: occ beer  . Drug use: Never  . Sexual activity: Yes    Birth control/protection: None  Other Topics Concern  . 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 PYKDXIP(3825). 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   Social Determinants of Health   Financial Resource Strain:   . Difficulty of Paying Living Expenses:   Food Insecurity:   . Worried About Charity fundraiser in the Last Year:   . Arboriculturist in the Last Year:   Transportation Needs:   . Film/video editor (Medical):   Marland Kitchen  Lack of Transportation (Non-Medical):   Physical Activity:   . Days of Exercise per Week:   . Minutes of Exercise per Session:   Stress:   . Feeling of Stress :   Social Connections:   . Frequency of Communication with Friends and Family:   . Frequency of Social Gatherings with Friends and Family:   . Attends Religious Services:   . Active Member of Clubs or Organizations:   . Attends Archivist Meetings:   Marland Kitchen Marital Status:   Intimate Partner Violence:   . Fear of Current or Ex-Partner:   . Emotionally Abused:   Marland Kitchen Physically Abused:   . Sexually Abused:     Past Medical History, Surgical history, Social history, and Family history were reviewed and updated as appropriate.   Please see review of systems for further details on the patient's review from today.   Review of Systems:  Review of Systems  Constitutional: Negative for chills, diaphoresis and fever.  HENT: Negative for facial swelling and  trouble swallowing.   Respiratory: Negative for cough, chest tightness and shortness of breath.   Cardiovascular: Negative for chest pain.  Skin: Positive for rash.    Objective:   Physical Exam:  BP 122/76 (BP Location: Left Arm, Patient Position: Sitting)   Pulse 95   Temp 98.2 F (36.8 C) (Temporal)   Resp 18   Ht 6\' 2"  (1.88 m)   Wt 169 lb 4.8 oz (76.8 kg)   SpO2 98%   BMI 21.74 kg/m  ECOG: 0  Physical Exam Constitutional:      General: He is not in acute distress.    Appearance: He is not diaphoretic.  HENT:     Head: Normocephalic and atraumatic.     Mouth/Throat:     Pharynx: No oropharyngeal exudate.  Eyes:     General: No scleral icterus.       Right eye: No discharge.        Left eye: No discharge.  Cardiovascular:     Rate and Rhythm: Normal rate and regular rhythm.     Heart sounds: Normal heart sounds. No murmur. No friction rub. No gallop.   Pulmonary:     Effort: Pulmonary effort is normal. No respiratory distress.     Breath sounds: Normal breath sounds. No wheezing or rales.  Musculoskeletal:        General: No tenderness or deformity.  Skin:    General: Skin is warm and dry.     Findings: Erythema and rash present.     Comments: Multiple erythematous lesions and pustules are noted over the chest.  There are diffuse multiple erythematous macular lesions of the posterior bilateral upper extremities.  An accessed port is noted in the right chest wall.  Neurological:     Mental Status: He is alert.     Gait: Gait abnormal (The patient is ambulating with the use of a wheelchair.).     Lab Review:     Component Value Date/Time   NA 140 09/15/2019 0950   K 4.6 09/15/2019 0950   CL 105 09/15/2019 0950   CO2 27 09/15/2019 0950   GLUCOSE 89 09/15/2019 0950   BUN 15 09/15/2019 0950   CREATININE 0.69 09/15/2019 0950   CALCIUM 8.5 (L) 09/15/2019 0950   PROT 7.1 09/15/2019 0950   ALBUMIN 2.7 (L) 09/15/2019 0950   AST 10 (L) 09/15/2019 0950   ALT  11 09/15/2019 0950   ALKPHOS 97 09/15/2019 0950   BILITOT  0.3 09/15/2019 0950   GFRNONAA >60 09/15/2019 0950   GFRAA >60 09/15/2019 0950       Component Value Date/Time   WBC 9.6 09/15/2019 0950   WBC 27.4 (H) 09/04/2019 1210   RBC 2.67 (L) 09/15/2019 0950   HGB 7.5 (L) 09/15/2019 0950   HCT 24.7 (L) 09/15/2019 0950   PLT 128 (L) 09/15/2019 0950   MCV 92.5 09/15/2019 0950   MCH 28.1 09/15/2019 0950   MCHC 30.4 09/15/2019 0950   RDW 20.6 (H) 09/15/2019 0950   LYMPHSABS 0.6 (L) 09/15/2019 0950   MONOABS 0.6 09/15/2019 0950   EOSABS 0.1 09/15/2019 0950   BASOSABS 0.0 09/15/2019 0950   -------------------------------  Imaging from last 24 hours (if applicable):  Radiology interpretation: NM PET Image Initial (PI) Skull Base To Thigh  Result Date: 09/08/2019 CLINICAL DATA:  Initial treatment strategy for non-small-cell lung cancer, staging. COVID-19 vaccine 3 weeks ago. Radiation therapy in March. EXAM: NUCLEAR MEDICINE PET SKULL BASE TO THIGH TECHNIQUE: 8.3 mCi F-18 FDG was injected intravenously. Full-ring PET imaging was performed from the skull base to thigh after the radiotracer. CT data was obtained and used for attenuation correction and anatomic localization. Fasting blood glucose: 84 mg/dl COMPARISON:  CT chest 08/06/2019.  Abdominopelvic CT 321. FINDINGS: Mediastinal blood pool activity: SUV max 2.0 Liver activity: SUV max NA NECK: No areas of abnormal hypermetabolism. Incidental CT findings: No cervical adenopathy. CHEST: No thoracic nodal hypermetabolism. Multifocal esophageal hypermetabolism may be radiation induced. Example at a S.U.V. max of 5.4 including on 82/4. Significant improvement since 08/06/2019 in previously described necrotic central right lower lobe lung mass. Residual peripherally hypermetabolic mass measures on the order of 6.2 x 4.7 cm and a S.U.V. max of 4.4 including on 94/4. Direct mediastinal extension has improved. Hypermetabolism corresponding to inferior  right upper lobe nodularity and interstitial thickening. Example hypermetabolic nodule at 5 mm and a S.U.V. max of 2.8 on 90/4. Incidental CT findings: Right Port-A-Cath tip low SVC. Aortic and coronary artery atherosclerosis. Small right pleural effusion persists. Centrilobular emphysema. Left lower lobe 4 mm nodule anteriorly on 34/8, present previously. ABDOMEN/PELVIS: No abdominopelvic parenchymal or nodal hypermetabolism. Incidental CT findings: Normal adrenal glands. Large colonic stool burden. Abdominal aortic atherosclerosis. Extensive colonic diverticulosis. SKELETON: There is diffuse marrow hypermetabolism, save relative photopenia at the level of mid to lower thoracic radiation exposure. Incidental CT findings: none IMPRESSION: 1. Since the CT of 08/06/2019, response to therapy of central right lower lobe lung mass. Persistent hypermetabolic anterior right lung base nodularity, which remains suspicious for metastatic disease. 2. No thoracic nodal hypermetabolism. 3. Esophageal hypermetabolism, possibly representing radiation induced esophagitis. 4. Diffuse marrow hypermetabolism, presumably due to marrow stimulation by chemotherapy (pharmacy note describes chemotherapy beginning last week). This limits sensitivity for osseous metastasis. 5. Incidental findings, including: Similar small right pleural effusion. Aortic atherosclerosis (ICD10-I70.0), coronary artery atherosclerosis and emphysema (ICD10-J43.9). Possible constipation. Electronically Signed   By: Abigail Miyamoto M.D.   On: 09/08/2019 14:39   IR IMAGING GUIDED PORT INSERTION  Result Date: 09/04/2019 INDICATION: 74 year old male with stage IV squamous cell carcinoma of the right lung. He presents for port catheter placement EXAM: IMPLANTED PORT A CATH PLACEMENT WITH ULTRASOUND AND FLUOROSCOPIC GUIDANCE MEDICATIONS: 2 g Ancef; The antibiotic was administered within an appropriate time interval prior to skin puncture. ANESTHESIA/SEDATION: Versed 2  mg IV; Fentanyl 100 mcg IV; Moderate Sedation Time:  26 minutes The patient was continuously monitored during the procedure by the interventional radiology nurse under my direct  supervision. FLUOROSCOPY TIME:  1 minutes, 30 seconds (12 mGy) COMPLICATIONS: None immediate. PROCEDURE: The right neck and chest was prepped with chlorhexidine, and draped in the usual sterile fashion using maximum barrier technique (cap and mask, sterile gown, sterile gloves, large sterile sheet, hand hygiene and cutaneous antiseptic). Local anesthesia was attained by infiltration with 1% lidocaine with epinephrine. Ultrasound demonstrated patency of the right internal jugular vein, and this was documented with an image. Under real-time ultrasound guidance, this vein was accessed with a 21 gauge micropuncture needle and image documentation was performed. A small dermatotomy was made at the access site with an 11 scalpel. A 0.018" wire was advanced into the SVC and the access needle exchanged for a 61F micropuncture vascular sheath. The 0.018" wire was then removed and a 0.035" wire advanced into the IVC. An appropriate location for the subcutaneous reservoir was selected below the clavicle and an incision was made through the skin and underlying soft tissues. The subcutaneous tissues were then dissected using a combination of blunt and sharp surgical technique and a pocket was formed. A single lumen power injectable portacatheter was then tunneled through the subcutaneous tissues from the pocket to the dermatotomy and the port reservoir placed within the subcutaneous pocket. The venous access site was then serially dilated and a peel away vascular sheath placed over the wire. The wire was removed and the port catheter advanced into position under fluoroscopic guidance. The catheter tip is positioned in the superior cavoatrial junction. This was documented with a spot image. The portacatheter was then tested and found to flush and aspirate  well. The port was flushed with saline followed by 100 units/mL heparinized saline. The pocket was then closed in two layers using first subdermal inverted interrupted absorbable sutures followed by a running subcuticular suture. The epidermis was then sealed with Dermabond. The dermatotomy at the venous access site was also closed with Dermabond. IMPRESSION: Successful placement of a right IJ approach Power Port with ultrasound and fluoroscopic guidance. The catheter is ready for use. Electronically Signed   By: Jacqulynn Cadet M.D.   On: 09/04/2019 16:42

## 2019-09-15 NOTE — Patient Instructions (Signed)

## 2019-09-16 ENCOUNTER — Other Ambulatory Visit: Payer: Self-pay

## 2019-09-16 ENCOUNTER — Inpatient Hospital Stay: Payer: Medicare Other

## 2019-09-16 DIAGNOSIS — C3431 Malignant neoplasm of lower lobe, right bronchus or lung: Secondary | ICD-10-CM | POA: Diagnosis not present

## 2019-09-16 DIAGNOSIS — J910 Malignant pleural effusion: Secondary | ICD-10-CM | POA: Diagnosis not present

## 2019-09-16 DIAGNOSIS — Z5111 Encounter for antineoplastic chemotherapy: Secondary | ICD-10-CM | POA: Diagnosis not present

## 2019-09-16 DIAGNOSIS — D630 Anemia in neoplastic disease: Secondary | ICD-10-CM | POA: Diagnosis not present

## 2019-09-16 DIAGNOSIS — D649 Anemia, unspecified: Secondary | ICD-10-CM

## 2019-09-16 DIAGNOSIS — Z452 Encounter for adjustment and management of vascular access device: Secondary | ICD-10-CM | POA: Diagnosis not present

## 2019-09-16 DIAGNOSIS — Z7689 Persons encountering health services in other specified circumstances: Secondary | ICD-10-CM | POA: Diagnosis not present

## 2019-09-16 MED ORDER — DIPHENHYDRAMINE HCL 25 MG PO CAPS
25.0000 mg | ORAL_CAPSULE | Freq: Once | ORAL | Status: AC
  Administered 2019-09-16: 09:00:00 25 mg via ORAL

## 2019-09-16 MED ORDER — SODIUM CHLORIDE 0.9% IV SOLUTION
250.0000 mL | Freq: Once | INTRAVENOUS | Status: AC
  Administered 2019-09-16: 250 mL via INTRAVENOUS
  Filled 2019-09-16: qty 250

## 2019-09-16 MED ORDER — SODIUM CHLORIDE 0.9% FLUSH
10.0000 mL | Freq: Once | INTRAVENOUS | Status: AC
  Administered 2019-09-16: 10 mL via INTRAVENOUS
  Filled 2019-09-16: qty 10

## 2019-09-16 MED ORDER — ACETAMINOPHEN 325 MG PO TABS: 650.0000 mg | ORAL_TABLET | Freq: Once | ORAL | Status: DC

## 2019-09-16 MED ORDER — HEPARIN SOD (PORK) LOCK FLUSH 100 UNIT/ML IV SOLN
500.0000 [IU] | Freq: Once | INTRAVENOUS | Status: AC
  Administered 2019-09-16: 500 [IU] via INTRAVENOUS
  Filled 2019-09-16: qty 5

## 2019-09-16 MED ORDER — DIPHENHYDRAMINE HCL 25 MG PO CAPS
ORAL_CAPSULE | ORAL | Status: AC
  Filled 2019-09-16: qty 2

## 2019-09-16 MED ORDER — ACETAMINOPHEN 325 MG PO TABS
ORAL_TABLET | ORAL | Status: AC
  Filled 2019-09-16: qty 2

## 2019-09-16 NOTE — Patient Instructions (Signed)

## 2019-09-16 NOTE — Progress Notes (Addendum)
Patient received 1 unit PRBCs. Tolerated well. No s/s or c/o distress or discomfort. VSS. Patient voided x1 during blood administration. Declined food, water provided to patient as offered.

## 2019-09-16 NOTE — Progress Notes (Deleted)
Patient voided x1 during treatment.

## 2019-09-17 LAB — TYPE AND SCREEN
ABO/RH(D): A POS
Antibody Screen: NEGATIVE
Unit division: 0

## 2019-09-17 LAB — BPAM RBC
Blood Product Expiration Date: 202104252359
ISSUE DATE / TIME: 202104131004
Unit Type and Rh: 6200

## 2019-09-17 NOTE — Progress Notes (Signed)
Pharmacist Chemotherapy Monitoring - Follow Up Assessment    I verify that I have reviewed each item in the below checklist:  . Regimen for the patient is scheduled for the appropriate day and plan matches scheduled date. Marland Kitchen Appropriate non-routine labs are ordered dependent on drug ordered. . If applicable, additional medications reviewed and ordered per protocol based on lifetime cumulative doses and/or treatment regimen.   Plan for follow-up and/or issues identified: No . I-vent associated with next due treatment: No . MD and/or nursing notified: No  Jeremy [name] D 09/17/2019 4:12 PM

## 2019-09-19 ENCOUNTER — Ambulatory Visit (INDEPENDENT_AMBULATORY_CARE_PROVIDER_SITE_OTHER): Payer: Medicare Other | Admitting: Cardiology

## 2019-09-19 ENCOUNTER — Other Ambulatory Visit: Payer: Self-pay

## 2019-09-19 ENCOUNTER — Encounter: Payer: Self-pay | Admitting: Cardiology

## 2019-09-19 VITALS — BP 120/76 | HR 92 | Ht 74.0 in | Wt 173.0 lb

## 2019-09-19 DIAGNOSIS — I480 Paroxysmal atrial fibrillation: Secondary | ICD-10-CM

## 2019-09-19 MED ORDER — DILTIAZEM HCL 30 MG PO TABS: 30.0000 mg | ORAL_TABLET | Freq: Two times a day (BID) | ORAL | 1 refills | Status: DC

## 2019-09-19 NOTE — Patient Instructions (Signed)
Your physician recommends that you schedule a follow-up appointment in: [place] has recommended you make the following change in your medication:   CHANGE DILTIAZEM 30 MG TWICE DAILY  Your physician has requested that you have an echocardiogram. Echocardiography is a painless test that uses sound waves to create images of your heart. It provides your doctor with information about the size and shape of your heart and how well your heart's chambers and valves are working. This procedure takes approximately one hour. There are no restrictions for this procedure.  Thank you for choosing [facility]!!

## 2019-09-19 NOTE — Progress Notes (Signed)
Clinical Summary Jeremy [name] is a 74 y.o.male seen as new patient for the following medical probems.   1. PAF - recent episodes of palpitations - ER visit 09/01/19, by time of evaluation symptoms had resolved. EKG showed NSR  - 08/08/19 EKG shows afib with RVR.  - 08/2019 MRI brain no mets - asymptomatic during episodes.  2. Lung cancer stage IIIc/IV (T4, N2, M0/M1 a) non-small cell lung cancer, squamous cell carcinoma. He presented with large necrotic mass extending to the carina with associated obstruction of the right lower lobe bronchus and postobstructive collapse/consolidation and necrosis in the right lower lobe as well as mediastinal lymphadenopathy with 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  - on chemo with carboplatin for AUC of 5, paclitaxel 175 mg/M2 and Keytruda 200 mg IV every 3 weeks with Neulasta support.   3. 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 Hgb down to 7.5 during 4/12 visit, was to get 1 unit prbc Past Medical History:  Diagnosis Date  . COPD (chronic obstructive pulmonary disease) (HCC)    QUIT SMOKING 30 YRS AGO  . Diabetes ([place]) 2018  . HTN (hypertension)   . Psoriatic arthritis ([place]) 2015     Allergies  Allergen Reactions  . Aleve [Naproxen Sodium] Hives and Rash     Current Outpatient Medications  Medication Sig Dispense Refill  . acetaminophen (TYLENOL) 500 MG tablet Take 500 mg by mouth every 4 (four) hours as needed (for pain.).    Marland Kitchen albuterol (VENTOLIN HFA) 108 (90 Base) MCG/ACT inhaler Inhale 2 puffs into the lungs every 6 (six) hours as needed for wheezing or shortness of breath.    . Ascorbic Acid (VITAMIN C) 1000 MG tablet Take 1,000 mg by mouth daily.    . cyclobenzaprine (FLEXERIL) 10 MG tablet Take 10 mg by mouth at bedtime.    Marland Kitchen diltiazem (CARDIZEM) 30 MG tablet Take 1 tablet (30 mg total) by mouth as needed. (Patient not taking: Reported on 09/08/2019) 30 tablet 0  . docusate  sodium (COLACE) 100 MG capsule Take 100 mg by mouth 2 (two) times daily.    Marland Kitchen doxycycline (MONODOX) 50 MG capsule Take 50 mg by mouth 2 (two) times daily as needed (rosacea flare ups.).    Marland Kitchen ferrous sulfate 325 (65 FE) MG tablet Take 325 mg by mouth daily with supper.    Marland Kitchen FLOVENT HFA 110 MCG/ACT inhaler 1 puff 2 (two) times daily.    . folic acid (FOLVITE) 1 MG tablet Take 1 mg by mouth daily.    Marland Kitchen gabapentin (NEURONTIN) 300 MG capsule Take 600 mg by mouth at bedtime.    . lidocaine-prilocaine (EMLA) cream Apply to the Port-A-Cath site 30 minutes before treatment 30 g 0  . metFORMIN (GLUCOPHAGE-XR) 500 MG 24 hr tablet Take 500-1,000 mg by mouth See admin instructions. Take 2 tablets (1000 mg) by mouth in the morning & 1 tablet (500 mg) by mouth at night.    . metroNIDAZOLE (METROCREAM) 0.75 % cream Apply 1 application topically 2 (two) times daily as needed (facial rosacea).     . mometasone (ELOCON) 0.1 % cream Apply 1 application topically 2 (two) times daily as needed (psoriasis).     . Omega-3 Fatty Acids (FISH OIL) 1000 MG CAPS Take 1,000 mg by mouth daily.     . pantoprazole (PROTONIX) 40 MG tablet 1 po 30 mins prior to first meal 30 tablet 11  . prochlorperazine (COMPAZINE) 10  MG tablet Take 1 tablet (10 mg total) by mouth every 6 (six) hours as needed for nausea or vomiting. 30 tablet 0  . traMADol (ULTRAM) 50 MG tablet Take 50 mg by mouth every 4 (four) hours as needed. for pain    . triamcinolone lotion (KENALOG) 0.1 % Apply 1 application topically 4 (four) times daily. 120 mL 1  . umeclidinium-vilanterol (ANORO ELLIPTA) 62.5-25 MCG/INH AEPB Inhale 1 puff into the lungs daily. 30 each 0   No current facility-administered medications for this visit.     Past Surgical History:  Procedure Laterality Date  . BACK SURGERY  1990  . BIOPSY  12/30/2018   Procedure: BIOPSY;  Surgeon: Danie Binder, MD;  Location: [facility];  Service: Endoscopy;;  gastric  . CATARACT EXTRACTION  Bilateral   . CERVICAL SPINE SURGERY  1995  . COLONOSCOPY WITH PROPOFOL N/A 12/30/2018   Procedure: COLONOSCOPY WITH PROPOFOL;  Surgeon: Danie Binder, MD;  Location: [facility];  Service: Endoscopy;  Laterality: N/A;  12:30pm  . ESOPHAGOGASTRODUODENOSCOPY (EGD) WITH PROPOFOL N/A 12/30/2018   Procedure: ESOPHAGOGASTRODUODENOSCOPY (EGD) WITH PROPOFOL;  Surgeon: Danie Binder, MD;  Location: [facility];  Service: Endoscopy;  Laterality: N/A;  . IR IMAGING GUIDED PORT INSERTION  09/04/2019  . POLYPECTOMY  12/30/2018   Procedure: POLYPECTOMY;  Surgeon: Danie Binder, MD;  Location: [facility];  Service: Endoscopy;;  colon  . THORACENTESIS Right 08/08/2019   Procedure: Thoracentesis;  Surgeon: [name], MD;  Location: [place];  Service: Pulmonary;  Laterality: Right;  Marland Kitchen VIDEO BRONCHOSCOPY Right 08/08/2019   Procedure: Video Bronchoscopy with Erbe Cryo Biopsy of Right mainstem;  Surgeon: [name], MD;  Location: [facility] OR;  Service: Pulmonary;  Laterality: Right;     Allergies  Allergen Reactions  . Aleve [Naproxen Sodium] Hives and Rash      Family History  Problem Relation Age of Onset  . Alcoholism Father   . CAD Brother   . Diabetes Brother   . Colon cancer Neg Hx   . Colon polyps Neg Hx   . Gastric cancer Neg Hx      Social History Jeremy [name] reports that he quit smoking about 27 years ago. His smoking use included cigarettes. He has a 25.00 pack-year smoking history. He has never used smokeless tobacco. Jeremy [name] reports current alcohol use.   Review of Systems CONSTITUTIONAL: No weight loss, fever, chills, weakness or fatigue.  HEENT: Eyes: No visual loss, blurred vision, double vision or yellow sclerae.No hearing loss, sneezing, congestion, runny nose or sore throat.  SKIN: No rash or itching.  CARDIOVASCULAR: per hpi RESPIRATORY: No shortness of breath, cough or sputum.  GASTROINTESTINAL: No anorexia, nausea, vomiting or diarrhea. No abdominal pain or blood.    GENITOURINARY: No burning on urination, no polyuria NEUROLOGICAL: No headache, dizziness, syncope, paralysis, ataxia, numbness or tingling in the extremities. No change in bowel or bladder control.  MUSCULOSKELETAL: No muscle, back pain, joint pain or stiffness.  LYMPHATICS: No enlarged nodes. No history of splenectomy.  PSYCHIATRIC: No history of depression or anxiety.  ENDOCRINOLOGIC: No reports of sweating, cold or heat intolerance. No polyuria or polydipsia.  Marland Kitchen   Physical Examination Today's Vitals   09/19/19 1029 09/19/19 1034  BP: 126/80 120/76  Pulse: 92 92  SpO2:  97%  Weight: 173 lb (78.5 kg)   Height: 6\' 2"  (1.88 m)    Body mass index is 22.21 kg/m.  Gen: resting comfortably, no  acute distress HEENT: no scleral icterus, pupils equal round and reactive, no palptable cervical adenopathy,  CV: RRR, no m/r/g, no jvd Resp: Clear to auscultation bilaterally GI: abdomen is soft, non-tender, non-distended, normal bowel sounds, no hepatosplenomegaly MSK: extremities are warm, no edema.  Skin: warm, no rash Neuro:  no focal deficits Psych: appropriate affect      Assessment and Plan  1. PAF - asymptomatic, episodes typically noticed during medical visits or by vitals checks at home - change diltiazem to 30mg  bid - obtain echo - significant anemia with recent transfusion, would not start anticoag at this time. If anemia stabilizes reconsider at that time. CHADS2Vasc score is at least 3.     F/u 4 months virtual.   Arnoldo Lenis, M.D

## 2019-09-20 NOTE — Progress Notes (Signed)
[facility] OFFICE PROGRESS NOTE  [name], [name], MD [address] 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  PDL1 expression 20%.  PRIOR THERAPY: Palliative radiotherapy under the care of Dr. Sondra Come to the obstructive right lower lobe lung mass.  CURRENT THERAPY: Systemic chemotherapy with carboplatin for AUC of 5, paclitaxel 175 mg/M2 and Keytruda 200 mg IV every 3 weeks with Neulasta support. First dose September 02, 2019. Status post 1 cycle.   Keytruda held for cycle #2 due to skin rash.  INTERVAL HISTORY: Jeremy [name] 74 y.o. male returns to the clinic for a follow up visit accompanied by his wife. The patient tolerated his first treatment fairly well except for chemotherapy induced anemia requiring 1 unit of pRBCs. He also developed a rash that started on his chest and has spread to his back and extremities.  The rash is likely secondary to his treatment with immunotherapy with Keytruda. He was seen in [facility] and was given triamcinolone cream and instructed to take Claritin/benedryl. His rash is unchanged/worse at this time. He denies any more pruritus. Denies any fever, chills, or night sweats. He is scheduled to meet with a member of the nutritionist team while in the infusion room today. He has gained 4lbs compared to his last visit.  He states his energy and appetite has picked up since his prior appointments.  Denies any chest pain, cough, or hemoptysis.  He reports his baseline shortness of breath with exertion.  He was prescribed oxygen after being discharged from the hospital a few months ago.  He states that he only uses his oxygen occasionally at nighttime or intermittently through the day if needed.  He notes that he feels like his breathing is improved since his diagnosis.  Denies any nausea, vomiting, diarrhea, or constipation. Denies any headache or  visual changes. He recently had a visit with his cardiologist. He had developed a fib with RVR after his bronchoscopy as well as after his first infusion, which converted to NSR before making it to the ER. His cardiologist placed him on diltiazem and is ordering an echocardiogram. They are not planning on starting anticoagulation at this time due to his anemia, per chart review. The patient is here today for evaluation prior to starting cycle #2.    MEDICAL HISTORY: Past Medical History:  Diagnosis Date  . COPD (chronic obstructive pulmonary disease) (HCC)    QUIT SMOKING 30 YRS AGO  . Diabetes ([place]) 2018  . HTN (hypertension)   . Psoriatic arthritis ([place]) 2015    ALLERGIES:  is allergic to aleve [naproxen sodium].  MEDICATIONS:  Current Outpatient Medications  Medication Sig Dispense Refill  . acetaminophen (TYLENOL) 500 MG tablet Take 500 mg by mouth every 4 (four) hours as needed (for pain.).    Marland Kitchen albuterol (VENTOLIN HFA) 108 (90 Base) MCG/ACT inhaler Inhale 2 puffs into the lungs every 6 (six) hours as needed for wheezing or shortness of breath.    . Ascorbic Acid (VITAMIN C) 1000 MG tablet Take 1,000 mg by mouth daily.    Marland Kitchen diltiazem (CARDIZEM) 30 MG tablet Take 1 tablet (30 mg total) by mouth 2 (two) times daily. 180 tablet 1  . docusate sodium (COLACE) 100 MG capsule Take 100 mg by mouth 2 (two) times daily.    Marland Kitchen doxycycline (MONODOX) 50 MG capsule Take 50 mg by mouth 2 (two) times daily as  needed (rosacea flare ups.).    Marland Kitchen ferrous sulfate 325 (65 FE) MG tablet Take 325 mg by mouth daily with supper.    Marland Kitchen FLOVENT HFA 110 MCG/ACT inhaler 1 puff 2 (two) times daily.    . folic acid (FOLVITE) 1 MG tablet Take 1 mg by mouth daily.    Marland Kitchen gabapentin (NEURONTIN) 300 MG capsule Take 600 mg by mouth at bedtime.    . lidocaine-prilocaine (EMLA) cream Apply to the Port-A-Cath site 30 minutes before treatment 30 g 0  . loratadine (CLARITIN) 10 MG tablet Take 10 mg by mouth daily.    .  metFORMIN (GLUCOPHAGE-XR) 500 MG 24 hr tablet Take 500-1,000 mg by mouth See admin instructions. Take 2 tablets (1000 mg) by mouth in the morning & 1 tablet (500 mg) by mouth at night.    . metroNIDAZOLE (METROCREAM) 0.75 % cream Apply 1 application topically 2 (two) times daily as needed (facial rosacea).     . mometasone (ELOCON) 0.1 % cream Apply 1 application topically 2 (two) times daily as needed (psoriasis).     . Omega-3 Fatty Acids (FISH OIL) 1000 MG CAPS Take 1,000 mg by mouth daily.     . pantoprazole (PROTONIX) 40 MG tablet 1 po 30 mins prior to first meal 30 tablet 11  . prochlorperazine (COMPAZINE) 10 MG tablet Take 1 tablet (10 mg total) by mouth every 6 (six) hours as needed for nausea or vomiting. 30 tablet 0  . traMADol (ULTRAM) 50 MG tablet Take 50 mg by mouth every 4 (four) hours as needed. for pain    . triamcinolone lotion (KENALOG) 0.1 % Apply 1 application topically 4 (four) times daily. 120 mL 1  . umeclidinium-vilanterol (ANORO ELLIPTA) 62.5-25 MCG/INH AEPB Inhale 1 puff into the lungs daily. 30 each 0  . predniSONE (DELTASONE) 20 MG tablet Please take 4 tablets (80 mg) daily for one week, followed by 3 tablets (60 mg) daily for 1 week, followed by 2 tablets (20 mg) for one week 70 tablet 0   No current facility-administered medications for this visit.   Facility-Administered Medications Ordered in Other Visits  Medication Dose Route Frequency Provider Last Rate Last Admin  . CARBOplatin (PARAPLATIN) 460 mg in sodium chloride 0.9 % 250 mL chemo infusion  460 mg Intravenous Once Curt Bears, MD      . heparin lock flush 100 unit/mL  500 Units Intracatheter Once PRN Curt Bears, MD      . PACLitaxel (TAXOL) 348 mg in sodium chloride 0.9 % 500 mL chemo infusion (> 36m/m2)  175 mg/m2 (Treatment Plan Recorded) Intravenous Once MCurt Bears MD 186 mL/hr at 09/23/19 1245 348 mg at 09/23/19 1245  . sodium chloride flush (NS) 0.9 % injection 10 mL  10 mL  Intracatheter PRN MCurt Bears MD        SURGICAL HISTORY:  Past Surgical History:  Procedure Laterality Date  . BACK SURGERY  1990  . BIOPSY  12/30/2018   Procedure: BIOPSY;  Surgeon: FDanie Binder MD;  [facility]: [facility];  Service: Endoscopy;;  gastric  . CATARACT EXTRACTION Bilateral   . CERVICAL SPINE SURGERY  1995  . COLONOSCOPY WITH PROPOFOL N/A 12/30/2018   Procedure: COLONOSCOPY WITH PROPOFOL;  Surgeon: FDanie Binder MD;  Location: [facility];  Service: Endoscopy;  Laterality: N/A;  12:30pm  . ESOPHAGOGASTRODUODENOSCOPY (EGD) WITH PROPOFOL N/A 12/30/2018   Procedure: ESOPHAGOGASTRODUODENOSCOPY (EGD) WITH PROPOFOL;  Surgeon: FDanie Binder MD;  Location: [facility];  Service: Endoscopy;  Laterality: N/A;  . IR IMAGING GUIDED PORT INSERTION  09/04/2019  . POLYPECTOMY  12/30/2018   Procedure: POLYPECTOMY;  Surgeon: Danie Binder, MD;  Location: [facility];  Service: Endoscopy;;  colon  . THORACENTESIS Right 08/08/2019   Procedure: Thoracentesis;  Surgeon: [name], MD;  Location: [facility];  Service: Pulmonary;  Laterality: Right;  Marland Kitchen VIDEO BRONCHOSCOPY Right 08/08/2019   Procedure: Video Bronchoscopy with Erbe Cryo Biopsy of Right mainstem;  Surgeon: [name], MD;  Location: [facility] OR;  Service: Pulmonary;  Laterality: Right;    REVIEW OF SYSTEMS:   Review of Systems  Constitutional: Negative for appetite change, chills, fatigue, fever and unexpected weight change.  HENT: Negative for mouth sores, nosebleeds, sore throat and trouble swallowing.   Eyes: Negative for eye problems and icterus.  Respiratory: Positive for baseline dyspnea on exertion and cough (improved from prior). Negative for hemoptysis and wheezing.  Cardiovascular: Negative for chest pain and leg swelling.  Gastrointestinal: Negative for abdominal pain, constipation, diarrhea, nausea and vomiting.  Genitourinary: Negative for bladder incontinence, difficulty urinating, dysuria, frequency  and hematuria.   Musculoskeletal: Negative for back pain, gait problem, neck pain and neck stiffness.  Skin: Positive for maculopapular rash on chest, and back, extremities.  Neurological: Negative for dizziness, extremity weakness, gait problem, headaches, light-headedness and seizures.  Hematological: Negative for adenopathy. Does not bruise/bleed easily.  Psychiatric/Behavioral: Negative for confusion, depression and sleep disturbance. The patient is not nervous/anxious.     PHYSICAL EXAMINATION:  Blood pressure 117/71, pulse (!) 102, temperature 98.7 F (37.1 C), temperature source Temporal, resp. rate 18, height '6\' 2"'  (1.88 m), weight 173 lb 3.2 oz (78.6 kg), SpO2 98 %.  ECOG PERFORMANCE STATUS: 1 - Symptomatic but completely ambulatory  Physical Exam  Constitutional: Oriented to person, place, and time and well-developed, well-nourished, and in no distress.  HENT:  Head: Normocephalic and atraumatic.  Mouth/Throat: Oropharynx is clear and moist. No oropharyngeal exudate.  Eyes: Conjunctivae are normal. Right eye exhibits no discharge. Left eye exhibits no discharge. No scleral icterus.  Neck: Normal range of motion. Neck supple.  Cardiovascular: Normal rate, regular rhythm, normal heart sounds and intact distal pulses.   Pulmonary/Chest: Effort normal and breath sounds normal. No respiratory distress. No wheezes. No rales.  Abdominal: Soft. Bowel sounds are normal. Exhibits no distension and no mass. There is no tenderness.  Musculoskeletal: Normal range of motion. Exhibits no edema.  Lymphadenopathy:    No cervical adenopathy.  Neurological: Alert and oriented to person, place, and time. Exhibits normal muscle tone. Gait normal. Coordination normal.  Skin: Skin is warm and dry. Not diaphoretic.      Psychiatric: Mood, memory and judgment normal.  Vitals reviewed.  LABORATORY DATA: Lab Results  Component Value Date   WBC 7.4 09/23/2019   HGB 9.1 (L) 09/23/2019   HCT  29.9 (L) 09/23/2019   MCV 95.5 09/23/2019   PLT 206 09/23/2019      Chemistry      Component Value Date/Time   NA 139 09/23/2019 0946   K 4.1 09/23/2019 0946   CL 104 09/23/2019 0946   CO2 26 09/23/2019 0946   BUN 21 09/23/2019 0946   CREATININE 0.79 09/23/2019 0946      Component Value Date/Time   CALCIUM 8.6 (L) 09/23/2019 0946   ALKPHOS 86 09/23/2019 0946   AST 10 (L) 09/23/2019 0946   ALT 10 09/23/2019 0946   BILITOT 0.4 09/23/2019 0946  RADIOGRAPHIC STUDIES:  NM PET Image Initial (PI) Skull Base To Thigh  Result Date: 09/08/2019 CLINICAL DATA:  Initial treatment strategy for non-small-cell lung cancer, staging. COVID-19 vaccine 3 weeks ago. Radiation therapy in March. EXAM: NUCLEAR MEDICINE PET SKULL BASE TO THIGH TECHNIQUE: 8.3 mCi F-18 FDG was injected intravenously. Full-ring PET imaging was performed from the skull base to thigh after the radiotracer. CT data was obtained and used for attenuation correction and anatomic localization. Fasting blood glucose: 84 mg/dl COMPARISON:  CT chest 08/06/2019.  Abdominopelvic CT 321. FINDINGS: Mediastinal blood pool activity: SUV max 2.0 Liver activity: SUV max NA NECK: No areas of abnormal hypermetabolism. Incidental CT findings: No cervical adenopathy. CHEST: No thoracic nodal hypermetabolism. Multifocal esophageal hypermetabolism may be radiation induced. Example at a S.U.V. max of 5.4 including on 82/4. Significant improvement since 08/06/2019 in previously described necrotic central right lower lobe lung mass. Residual peripherally hypermetabolic mass measures on the order of 6.2 x 4.7 cm and a S.U.V. max of 4.4 including on 94/4. Direct mediastinal extension has improved. Hypermetabolism corresponding to inferior right upper lobe nodularity and interstitial thickening. Example hypermetabolic nodule at 5 mm and a S.U.V. max of 2.8 on 90/4. Incidental CT findings: Right Port-A-Cath tip low SVC. Aortic and coronary artery  atherosclerosis. Small right pleural effusion persists. Centrilobular emphysema. Left lower lobe 4 mm nodule anteriorly on 34/8, present previously. ABDOMEN/PELVIS: No abdominopelvic parenchymal or nodal hypermetabolism. Incidental CT findings: Normal adrenal glands. Large colonic stool burden. Abdominal aortic atherosclerosis. Extensive colonic diverticulosis. SKELETON: There is diffuse marrow hypermetabolism, save relative photopenia at the level of mid to lower thoracic radiation exposure. Incidental CT findings: none IMPRESSION: 1. Since the CT of 08/06/2019, response to therapy of central right lower lobe lung mass. Persistent hypermetabolic anterior right lung base nodularity, which remains suspicious for metastatic disease. 2. No thoracic nodal hypermetabolism. 3. Esophageal hypermetabolism, possibly representing radiation induced esophagitis. 4. Diffuse marrow hypermetabolism, presumably due to marrow stimulation by chemotherapy (pharmacy note describes chemotherapy beginning last week). This limits sensitivity for osseous metastasis. 5. Incidental findings, including: Similar small right pleural effusion. Aortic atherosclerosis (ICD10-I70.0), coronary artery atherosclerosis and emphysema (ICD10-J43.9). Possible constipation. Electronically Signed   By: Abigail Miyamoto M.D.   On: 09/08/2019 14:39   IR IMAGING GUIDED PORT INSERTION  Result Date: 09/04/2019 INDICATION: 74 year old male with stage IV squamous cell carcinoma of the right lung. He presents for port catheter placement EXAM: IMPLANTED PORT A CATH PLACEMENT WITH ULTRASOUND AND FLUOROSCOPIC GUIDANCE MEDICATIONS: 2 g Ancef; The antibiotic was administered within an appropriate time interval prior to skin puncture. ANESTHESIA/SEDATION: Versed 2 mg IV; Fentanyl 100 mcg IV; Moderate Sedation Time:  26 minutes The patient was continuously monitored during the procedure by the interventional radiology nurse under my direct supervision. FLUOROSCOPY TIME:  1  minutes, 30 seconds (12 mGy) COMPLICATIONS: None immediate. PROCEDURE: The right neck and chest was prepped with chlorhexidine, and draped in the usual sterile fashion using maximum barrier technique (cap and mask, sterile gown, sterile gloves, large sterile sheet, hand hygiene and cutaneous antiseptic). Local anesthesia was attained by infiltration with 1% lidocaine with epinephrine. Ultrasound demonstrated patency of the right internal jugular vein, and this was documented with an image. Under real-time ultrasound guidance, this vein was accessed with a 21 gauge micropuncture needle and image documentation was performed. A small dermatotomy was made at the access site with an 11 scalpel. A 0.018" wire was advanced into the SVC and the access needle exchanged for a 57F micropuncture vascular  sheath. The 0.018" wire was then removed and a 0.035" wire advanced into the IVC. An appropriate location for the subcutaneous reservoir was selected below the clavicle and an incision was made through the skin and underlying soft tissues. The subcutaneous tissues were then dissected using a combination of blunt and sharp surgical technique and a pocket was formed. A single lumen power injectable portacatheter was then tunneled through the subcutaneous tissues from the pocket to the dermatotomy and the port reservoir placed within the subcutaneous pocket. The venous access site was then serially dilated and a peel away vascular sheath placed over the wire. The wire was removed and the port catheter advanced into position under fluoroscopic guidance. The catheter tip is positioned in the superior cavoatrial junction. This was documented with a spot image. The portacatheter was then tested and found to flush and aspirate well. The port was flushed with saline followed by 100 units/mL heparinized saline. The pocket was then closed in two layers using first subdermal inverted interrupted absorbable sutures followed by a running  subcuticular suture. The epidermis was then sealed with Dermabond. The dermatotomy at the venous access site was also closed with Dermabond. IMPRESSION: Successful placement of a right IJ approach Power Port with ultrasound and fluoroscopic guidance. The catheter is ready for use. Electronically Signed   By: Jacqulynn Cadet M.D.   On: 09/04/2019 16:42     ASSESSMENT/PLAN:  This is a very pleasant 74 year old Caucasian male with stage IIIc/IV (T4, N2, M0/M1 a) non-small cell lung cancer, squamous cell carcinoma. He presented with large necrotic mass extending to the carina with associated obstruction of the right lower lobe bronchus and postobstructive collapse/consolidation and necrosis in the right lower lobe as well as mediastinal lymphadenopathy with 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   The patient has PD-L1 expression of 20%.  The patient completed? Palliative radiotherapy to the obstructed right lower lobe lung mass under the care of Dr. Sondra Come. Last treatment on 08/28/2019  The patient is currently undergoing systemic chemotherapy with carboplatin for an AUC of 5, paclitaxel 175 mg/m2, and Ketyruda 200 mg IV every 3 weeks. He is status post his first cycle of treatment.  The patient developed a immunotherapy mediated rash after his first cycle of treatment.   Discussed the patient's rash with Dr. Julien Nordmann.  Dr. Julien Nordmann recommends the patient continue with treatment today with cycle #2 with carboplatin and paclitaxel today as scheduled.  Dr. Julien Nordmann recommends that we hold Keytruda and place the patient on a high-dose prednisone taper.  He was given a prescription for 80 mg p.o. daily for 1 week followed by, 60 mg daily for 1 week, followed by 40 mg daily for 1 week followed by 20 mg daily for 1 week.  The patient will be seen his next appointment to discuss further tapering.  We will see him back for a follow up visit in 3 weeks for evaluation before starting  cycle #3.   The patient will continue to follow with cardiology for there recommendations regarding his palpitations.   I will add extra orders for samples to blood bank on his weekly labs in the event that he requires another blood transfusion in the future.  The patient is scheduled to meet with a member of the nutritionist team today while in the infusion room to discuss further recommendations regarding his prior weight loss and decreased appetite.  The patient was advised to call immediately if he has any concerning symptoms  in the interval. The patient voices understanding of current disease status and treatment options and is in agreement with the current care plan. All questions were answered. The patient knows to call the clinic with any problems, questions or concerns. We can certainly see the patient much sooner if necessary     Orders Placed This Encounter  Procedures  . Sample to Blood Bank    Standing Status:   Future    Standing Expiration Date:   09/22/2020  . Sample to Blood Bank    Standing Status:   Future    Standing Expiration Date:   09/22/2020     Tobe Sos Mollie Rossano, PA-C 09/23/19  ADDENDUM: Hematology/Oncology Attending: I had a face-to-face encounter with the patient today.  I recommended his care plan.  This is a very pleasant 74 years old white male recently diagnosed with stage IV non-small cell lung cancer, squamous cell carcinoma.  He is currently undergoing a course of systemic chemotherapy with carboplatin, paclitaxel and Keytruda status post 1 cycle.  The patient tolerated the first cycle of his treatment well except for the significant rash involving the chest, back as well as the upper and lower extremities. I recommended for the patient to proceed with cycle #2 but we will hold his treatment with [facility] for now. For the significant skin rash we started the patient on a taper high dose of prednisone starting at 80 mg p.o. daily for 1 week  followed by gradual decrease on weekly basis. We will see the patient back for follow-up visit in 3 weeks for evaluation before starting cycle #3.  We may resume his treatment with Keytruda in the future if the patient has significant improvement in his skin rash. He was advised to call immediately if he has any other concerning symptoms in the interval.  Disclaimer: This note was dictated with voice recognition software. Similar sounding words can inadvertently be transcribed and may be missed upon review. Eilleen Kempf, MD 09/23/19

## 2019-09-21 LAB — ACID FAST CULTURE WITH REFLEXED SENSITIVITIES (MYCOBACTERIA): Acid Fast Culture: NEGATIVE

## 2019-09-23 ENCOUNTER — Inpatient Hospital Stay: Payer: Medicare Other | Admitting: Nutrition

## 2019-09-23 ENCOUNTER — Inpatient Hospital Stay: Payer: Medicare Other

## 2019-09-23 ENCOUNTER — Inpatient Hospital Stay (HOSPITAL_BASED_OUTPATIENT_CLINIC_OR_DEPARTMENT_OTHER): Payer: Medicare Other | Admitting: Physician Assistant

## 2019-09-23 ENCOUNTER — Encounter: Payer: Self-pay | Admitting: Physician Assistant

## 2019-09-23 ENCOUNTER — Other Ambulatory Visit: Payer: Self-pay

## 2019-09-23 VITALS — HR 94

## 2019-09-23 VITALS — BP 117/71 | HR 102 | Temp 98.7°F | Resp 18 | Ht 74.0 in | Wt 173.2 lb

## 2019-09-23 DIAGNOSIS — Z7689 Persons encountering health services in other specified circumstances: Secondary | ICD-10-CM | POA: Diagnosis not present

## 2019-09-23 DIAGNOSIS — Z95828 Presence of other vascular implants and grafts: Secondary | ICD-10-CM

## 2019-09-23 DIAGNOSIS — C3431 Malignant neoplasm of lower lobe, right bronchus or lung: Secondary | ICD-10-CM | POA: Diagnosis not present

## 2019-09-23 DIAGNOSIS — L270 Generalized skin eruption due to drugs and medicaments taken internally: Secondary | ICD-10-CM | POA: Insufficient documentation

## 2019-09-23 DIAGNOSIS — Z5111 Encounter for antineoplastic chemotherapy: Secondary | ICD-10-CM | POA: Diagnosis not present

## 2019-09-23 DIAGNOSIS — Z452 Encounter for adjustment and management of vascular access device: Secondary | ICD-10-CM | POA: Diagnosis not present

## 2019-09-23 DIAGNOSIS — C3491 Malignant neoplasm of unspecified part of right bronchus or lung: Secondary | ICD-10-CM

## 2019-09-23 DIAGNOSIS — R21 Rash and other nonspecific skin eruption: Secondary | ICD-10-CM

## 2019-09-23 DIAGNOSIS — D630 Anemia in neoplastic disease: Secondary | ICD-10-CM | POA: Diagnosis not present

## 2019-09-23 DIAGNOSIS — R5382 Chronic fatigue, unspecified: Secondary | ICD-10-CM

## 2019-09-23 DIAGNOSIS — J910 Malignant pleural effusion: Secondary | ICD-10-CM | POA: Diagnosis not present

## 2019-09-23 LAB — CMP (CANCER CENTER ONLY)
ALT: 10 U/L (ref 0–44)
AST: 10 U/L — ABNORMAL LOW (ref 15–41)
Albumin: 2.7 g/dL — ABNORMAL LOW (ref 3.5–5.0)
Alkaline Phosphatase: 86 U/L (ref 38–126)
Anion gap: 9 (ref 5–15)
BUN: 21 mg/dL (ref 8–23)
CO2: 26 mmol/L (ref 22–32)
Calcium: 8.6 mg/dL — ABNORMAL LOW (ref 8.9–10.3)
Chloride: 104 mmol/L (ref 98–111)
Creatinine: 0.79 mg/dL (ref 0.61–1.24)
GFR, Est AFR Am: >60 mL/min (ref >60)
GFR, Estimated: >60 mL/min (ref >60)
Glucose, Bld: 163 mg/dL — ABNORMAL HIGH (ref 70–99)
Potassium: 4.1 mmol/L (ref 3.5–5.1)
Sodium: 139 mmol/L (ref 135–145)
Total Bilirubin: 0.4 mg/dL (ref 0.3–1.2)
Total Protein: 7.4 g/dL (ref 6.5–8.1)

## 2019-09-23 LAB — CBC WITH DIFFERENTIAL (CANCER CENTER ONLY)
Abs Immature Granulocytes: 0.09 10*3/uL — ABNORMAL HIGH (ref 0.00–0.07)
Basophils Absolute: 0 10*3/uL (ref 0.0–0.1)
Basophils Relative: 1 %
Eosinophils Absolute: 0 10*3/uL (ref 0.0–0.5)
Eosinophils Relative: 1 %
HCT: 29.9 % — ABNORMAL LOW (ref 39.0–52.0)
Hemoglobin: 9.1 g/dL — ABNORMAL LOW (ref 13.0–17.0)
Immature Granulocytes: 1 %
Lymphocytes Relative: 8 %
Lymphs Abs: 0.6 10*3/uL — ABNORMAL LOW (ref 0.7–4.0)
MCH: 29.1 pg (ref 26.0–34.0)
MCHC: 30.4 g/dL (ref 30.0–36.0)
MCV: 95.5 fL (ref 80.0–100.0)
Monocytes Absolute: 0.5 10*3/uL (ref 0.1–1.0)
Monocytes Relative: 7 %
Neutro Abs: 6.1 10*3/uL (ref 1.7–7.7)
Neutrophils Relative %: 82 %
Platelet Count: 206 10*3/uL (ref 150–400)
RBC: 3.13 MIL/uL — ABNORMAL LOW (ref 4.22–5.81)
RDW: 20.1 % — ABNORMAL HIGH (ref 11.5–15.5)
WBC Count: 7.4 10*3/uL (ref 4.0–10.5)
nRBC: 0 % (ref 0.0–0.2)

## 2019-09-23 LAB — TSH: TSH: 1.014 u[IU]/mL (ref 0.320–4.118)

## 2019-09-23 MED ORDER — PALONOSETRON HCL INJECTION 0.25 MG/5ML
INTRAVENOUS | Status: AC
  Filled 2019-09-23: qty 5

## 2019-09-23 MED ORDER — FAMOTIDINE IN NACL 20-0.9 MG/50ML-% IV SOLN
INTRAVENOUS | Status: AC
  Filled 2019-09-23: qty 50

## 2019-09-23 MED ORDER — SODIUM CHLORIDE 0.9 % IV SOLN
Freq: Once | INTRAVENOUS | Status: AC
  Filled 2019-09-23: qty 250

## 2019-09-23 MED ORDER — HEPARIN SOD (PORK) LOCK FLUSH 100 UNIT/ML IV SOLN
500.0000 [IU] | INTRAVENOUS | Status: DC | PRN
  Filled 2019-09-23: qty 5

## 2019-09-23 MED ORDER — SODIUM CHLORIDE 0.9% FLUSH
10.0000 mL | INTRAVENOUS | Status: DC | PRN
  Administered 2019-09-23: 16:00:00 10 mL
  Filled 2019-09-23: qty 10

## 2019-09-23 MED ORDER — SODIUM CHLORIDE 0.9 % IV SOLN
461.5000 mg | Freq: Once | INTRAVENOUS | Status: AC
  Administered 2019-09-23: 460 mg via INTRAVENOUS
  Filled 2019-09-23: qty 46

## 2019-09-23 MED ORDER — SODIUM CHLORIDE 0.9 % IV SOLN
175.0000 mg/m2 | Freq: Once | INTRAVENOUS | Status: AC
  Administered 2019-09-23: 348 mg via INTRAVENOUS
  Filled 2019-09-23: qty 58

## 2019-09-23 MED ORDER — SODIUM CHLORIDE 0.9 % IV SOLN
150.0000 mg | Freq: Once | INTRAVENOUS | Status: AC
  Administered 2019-09-23: 150 mg via INTRAVENOUS
  Filled 2019-09-23: qty 150

## 2019-09-23 MED ORDER — PALONOSETRON HCL INJECTION 0.25 MG/5ML
0.2500 mg | Freq: Once | INTRAVENOUS | Status: AC
  Administered 2019-09-23: 0.25 mg via INTRAVENOUS

## 2019-09-23 MED ORDER — DIPHENHYDRAMINE HCL 50 MG/ML IJ SOLN
50.0000 mg | Freq: Once | INTRAMUSCULAR | Status: AC
  Administered 2019-09-23: 50 mg via INTRAVENOUS

## 2019-09-23 MED ORDER — SODIUM CHLORIDE 0.9 % IV SOLN
10.0000 mg | Freq: Once | INTRAVENOUS | Status: AC
  Administered 2019-09-23: 10 mg via INTRAVENOUS
  Filled 2019-09-23: qty 10

## 2019-09-23 MED ORDER — HEPARIN SOD (PORK) LOCK FLUSH 100 UNIT/ML IV SOLN
500.0000 [IU] | Freq: Once | INTRAVENOUS | Status: AC | PRN
  Administered 2019-09-23: 500 [IU]
  Filled 2019-09-23: qty 5

## 2019-09-23 MED ORDER — PREDNISONE 20 MG PO TABS: ORAL_TABLET | ORAL | 0 refills | Status: DC

## 2019-09-23 MED ORDER — DIPHENHYDRAMINE HCL 50 MG/ML IJ SOLN
INTRAMUSCULAR | Status: AC
  Filled 2019-09-23: qty 1

## 2019-09-23 MED ORDER — FAMOTIDINE IN NACL 20-0.9 MG/50ML-% IV SOLN
20.0000 mg | Freq: Once | INTRAVENOUS | Status: AC
  Administered 2019-09-23: 20 mg via INTRAVENOUS

## 2019-09-23 MED ORDER — SODIUM CHLORIDE 0.9% FLUSH
10.0000 mL | INTRAVENOUS | Status: AC | PRN
  Administered 2019-09-23: 10:00:00 10 mL
  Filled 2019-09-23: qty 10

## 2019-09-23 NOTE — Progress Notes (Signed)
74 year old male diagnosed with Lung cancer receiving chemotherapy and radiation treatments. He is a patient of Dr. Julien Nordmann.  PMH includes COPD, DM, HTN, and Tobacco.  Medications include Vitamin C, Colace, Glucophage XR, Omega 3 fatty acids, Protonix, and Compazine.  Labs include Glucose 163 and Albumin 2.7.  Height: 6 feet 2 inches Weight: 173.2 pounds UBW: 210 pounds BMI: 22.24  Patient reports he has lost a lot of weight. States he weighed about 210 pounds several months ago. This is an 18% wt loss over 4 months and is significant. Patient was diagnosed with severe malnutrition in the hospital. Reports he is eating everything and anything he wants. He likes Chocolate Ensure Enlive. Denies other nutrition impact symptoms.  Nutrition Diagnosis: Unintentional weight loss related to lung cancer and associated treatments as evidenced by 18% loss from usual body weight  Intervention: Patient educated to consume small frequent meals and snacks with higher calorie, higher protein foods. Reviewed importance of protein foods at each meal and snack. Recommended patient continue Ensure Enlive 1-2 bottles daily. Provided Ensure samples, recipes, coupons, and contact information.  Also provided patient with fact sheets. Questions were answered and teach back method used.  Monitoring, Evaluation, Goals: Patient will tolerate adequate calories and protein to minimize weight loss.  Next Visit: To be scheduled as needed.  **Disclaimer: This note was dictated with voice recognition software. Similar sounding words can inadvertently be transcribed and this note may contain transcription errors which may not have been corrected upon publication of note.**

## 2019-09-23 NOTE — Patient Instructions (Signed)
Paclitaxel injection What is this medicine? PACLITAXEL (PAK li TAX el) is a chemotherapy drug. It targets fast dividing cells, like cancer cells, and causes these cells to die. This medicine is used to treat ovarian cancer, breast cancer, lung cancer, Kaposi's sarcoma, and other cancers. This medicine may be used for other purposes; ask your health care provider or pharmacist if you have questions. COMMON BRAND NAME(S): Onxol, Taxol What should I tell my health care provider before I take this medicine? They need to know if you have any of these conditions:  history of irregular heartbeat  liver disease  low blood counts, like low white cell, platelet, or red cell counts  lung or breathing disease, like asthma  tingling of the fingers or toes, or other nerve disorder  an unusual or allergic reaction to paclitaxel, alcohol, polyoxyethylated castor oil, other chemotherapy, other medicines, foods, dyes, or preservatives  pregnant or trying to get pregnant  breast-feeding How should I use this medicine? This drug is given as an infusion into a vein. It is administered in a hospital or clinic by a specially trained health care professional. Talk to your pediatrician regarding the use of this medicine in children. Special care may be needed. Overdosage: If you think you have taken too much of this medicine contact a poison control center or emergency room at once. NOTE: This medicine is only for you. Do not share this medicine with others. What if I miss a dose? It is important not to miss your dose. Call your doctor or health care professional if you are unable to keep an appointment. What may interact with this medicine? Do not take this medicine with any of the following medications:  disulfiram  metronidazole This medicine may also interact with the following medications:  antiviral medicines for hepatitis, HIV or AIDS  certain antibiotics like erythromycin and  clarithromycin  certain medicines for fungal infections like ketoconazole and itraconazole  certain medicines for seizures like carbamazepine, phenobarbital, phenytoin  gemfibrozil  nefazodone  rifampin  St. John's wort This list may not describe all possible interactions. Give your health care provider a list of all the medicines, herbs, non-prescription drugs, or dietary supplements you use. Also tell them if you smoke, drink alcohol, or use illegal drugs. Some items may interact with your medicine. What should I watch for while using this medicine? Your condition will be monitored carefully while you are receiving this medicine. You will need important blood work done while you are taking this medicine. This medicine can cause serious allergic reactions. To reduce your risk you will need to take other medicine(s) before treatment with this medicine. If you experience allergic reactions like skin rash, itching or hives, swelling of the face, lips, or tongue, tell your doctor or health care professional right away. In some cases, you may be given additional medicines to help with side effects. Follow all directions for their use. This drug may make you feel generally unwell. This is not uncommon, as chemotherapy can affect healthy cells as well as cancer cells. Report any side effects. Continue your course of treatment even though you feel ill unless your doctor tells you to stop. Call your doctor or health care professional for advice if you get a fever, chills or sore throat, or other symptoms of a cold or flu. Do not treat yourself. This drug decreases your body's ability to fight infections. Try to avoid being around people who are sick. This medicine may increase your risk to bruise  or bleed. Call your doctor or health care professional if you notice any unusual bleeding. Be careful brushing and flossing your teeth or using a toothpick because you may get an infection or bleed more easily.  If you have any dental work done, tell your dentist you are receiving this medicine. Avoid taking products that contain aspirin, acetaminophen, ibuprofen, naproxen, or ketoprofen unless instructed by your doctor. These medicines may hide a fever. Do not become pregnant while taking this medicine. Women should inform their doctor if they wish to become pregnant or think they might be pregnant. There is a potential for serious side effects to an unborn child. Talk to your health care professional or pharmacist for more information. Do not breast-feed an infant while taking this medicine. Men are advised not to father a child while receiving this medicine. This product may contain alcohol. Ask your pharmacist or healthcare provider if this medicine contains alcohol. Be sure to tell all healthcare providers you are taking this medicine. Certain medicines, like metronidazole and disulfiram, can cause an unpleasant reaction when taken with alcohol. The reaction includes flushing, headache, nausea, vomiting, sweating, and increased thirst. The reaction can last from 30 minutes to several hours. What side effects may I notice from receiving this medicine? Side effects that you should report to your doctor or health care professional as soon as possible:  allergic reactions like skin rash, itching or hives, swelling of the face, lips, or tongue  breathing problems  changes in vision  fast, irregular heartbeat  high or low blood pressure  mouth sores  pain, tingling, numbness in the hands or feet  signs of decreased platelets or bleeding - bruising, pinpoint red spots on the skin, black, tarry stools, blood in the urine  signs of decreased red blood cells - unusually weak or tired, feeling faint or lightheaded, falls  signs of infection - fever or chills, cough, sore throat, pain or difficulty passing urine  signs and symptoms of liver injury like dark yellow or brown urine; general ill feeling or  flu-like symptoms; light-colored stools; loss of appetite; nausea; right upper belly pain; unusually weak or tired; yellowing of the eyes or skin  swelling of the ankles, feet, hands  unusually slow heartbeat Side effects that usually do not require medical attention (report to your doctor or health care professional if they continue or are bothersome):  diarrhea  hair loss  loss of appetite  muscle or joint pain  nausea, vomiting  pain, redness, or irritation at site where injected  tiredness This list may not describe all possible side effects. Call your doctor for medical advice about side effects. You may report side effects to FDA at 1-800-FDA-1088. Where should I keep my medicine? This drug is given in a hospital or clinic and will not be stored at home. NOTE: This sheet is a summary. It may not cover all possible information. If you have questions about this medicine, talk to your doctor, pharmacist, or health care provider.  2020 [employer]/Gold Standard (2017-01-23 13:14:55)  Carboplatin injection What is this medicine? CARBOPLATIN (KAR boe pla tin) is a chemotherapy drug. It targets fast dividing cells, like cancer cells, and causes these cells to die. This medicine is used to treat ovarian cancer and many other cancers. This medicine may be used for other purposes; ask your health care provider or pharmacist if you have questions. COMMON BRAND NAME(S): Paraplatin What should I tell my health care provider before I take this medicine? They need  to know if you have any of these conditions:  blood disorders  hearing problems  kidney disease  recent or ongoing radiation therapy  an unusual or allergic reaction to carboplatin, cisplatin, other chemotherapy, other medicines, foods, dyes, or preservatives  pregnant or trying to get pregnant  breast-feeding How should I use this medicine? This drug is usually given as an infusion into a vein. It is administered in a  hospital or clinic by a specially trained health care professional. Talk to your pediatrician regarding the use of this medicine in children. Special care may be needed. Overdosage: If you think you have taken too much of this medicine contact a poison control center or emergency room at once. NOTE: This medicine is only for you. Do not share this medicine with others. What if I miss a dose? It is important not to miss a dose. Call your doctor or health care professional if you are unable to keep an appointment. What may interact with this medicine?  medicines for seizures  medicines to increase blood counts like filgrastim, pegfilgrastim, sargramostim  some antibiotics like amikacin, gentamicin, neomycin, streptomycin, tobramycin  vaccines Talk to your doctor or health care professional before taking any of these medicines:  acetaminophen  aspirin  ibuprofen  ketoprofen  naproxen This list may not describe all possible interactions. Give your health care provider a list of all the medicines, herbs, non-prescription drugs, or dietary supplements you use. Also tell them if you smoke, drink alcohol, or use illegal drugs. Some items may interact with your medicine. What should I watch for while using this medicine? Your condition will be monitored carefully while you are receiving this medicine. You will need important blood work done while you are taking this medicine. This drug may make you feel generally unwell. This is not uncommon, as chemotherapy can affect healthy cells as well as cancer cells. Report any side effects. Continue your course of treatment even though you feel ill unless your doctor tells you to stop. In some cases, you may be given additional medicines to help with side effects. Follow all directions for their use. Call your doctor or health care professional for advice if you get a fever, chills or sore throat, or other symptoms of a cold or flu. Do not treat  yourself. This drug decreases your body's ability to fight infections. Try to avoid being around people who are sick. This medicine may increase your risk to bruise or bleed. Call your doctor or health care professional if you notice any unusual bleeding. Be careful brushing and flossing your teeth or using a toothpick because you may get an infection or bleed more easily. If you have any dental work done, tell your dentist you are receiving this medicine. Avoid taking products that contain aspirin, acetaminophen, ibuprofen, naproxen, or ketoprofen unless instructed by your doctor. These medicines may hide a fever. Do not become pregnant while taking this medicine. Women should inform their doctor if they wish to become pregnant or think they might be pregnant. There is a potential for serious side effects to an unborn child. Talk to your health care professional or pharmacist for more information. Do not breast-feed an infant while taking this medicine. What side effects may I notice from receiving this medicine? Side effects that you should report to your doctor or health care professional as soon as possible:  allergic reactions like skin rash, itching or hives, swelling of the face, lips, or tongue  signs of infection - fever  or chills, cough, sore throat, pain or difficulty passing urine  signs of decreased platelets or bleeding - bruising, pinpoint red spots on the skin, black, tarry stools, nosebleeds  signs of decreased red blood cells - unusually weak or tired, fainting spells, lightheadedness  breathing problems  changes in hearing  changes in vision  chest pain  high blood pressure  low blood counts - This drug may decrease the number of white blood cells, red blood cells and platelets. You may be at increased risk for infections and bleeding.  nausea and vomiting  pain, swelling, redness or irritation at the injection site  pain, tingling, numbness in the hands or  feet  problems with balance, talking, walking  trouble passing urine or change in the amount of urine Side effects that usually do not require medical attention (report to your doctor or health care professional if they continue or are bothersome):  hair loss  loss of appetite  metallic taste in the mouth or changes in taste This list may not describe all possible side effects. Call your doctor for medical advice about side effects. You may report side effects to FDA at 1-800-FDA-1088. Where should I keep my medicine? This drug is given in a hospital or clinic and will not be stored at home. NOTE: This sheet is a summary. It may not cover all possible information. If you have questions about this medicine, talk to your doctor, pharmacist, or health care provider.  2020 [employer]vier/Gold Standard (2007-08-27 14:38:05)

## 2019-09-24 NOTE — Progress Notes (Signed)
  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 The patient was evaluated as an inpatient on 08/10/2019 and was discharged from the hospital on 08/12/2019. Throughout treatment, he did report some mild fatigue, soreness to right chest, and a slight cough. Denied hemoptysis and esophageal symptoms. Breathing improvement throughout treatment.  Plan: The patient will follow-up with radiation oncology in one month.  ________________________________________________   Blair Promise, PhD, MD  This document serves as a record of services personally performed by Gery Pray, MD. It was created on his behalf by Clerance Lav, a trained medical scribe. The creation of this record is based on the scribe's personal observations and the provider's statements to them. This document has been checked and approved by the attending provider.

## 2019-09-25 ENCOUNTER — Other Ambulatory Visit: Payer: Self-pay

## 2019-09-25 ENCOUNTER — Inpatient Hospital Stay: Payer: Medicare Other

## 2019-09-25 ENCOUNTER — Telehealth: Payer: Self-pay | Admitting: Physician Assistant

## 2019-09-25 VITALS — BP 130/73 | HR 79 | Temp 98.9°F | Resp 20

## 2019-09-25 DIAGNOSIS — Z5111 Encounter for antineoplastic chemotherapy: Secondary | ICD-10-CM | POA: Diagnosis not present

## 2019-09-25 DIAGNOSIS — C3431 Malignant neoplasm of lower lobe, right bronchus or lung: Secondary | ICD-10-CM | POA: Diagnosis not present

## 2019-09-25 DIAGNOSIS — Z7689 Persons encountering health services in other specified circumstances: Secondary | ICD-10-CM | POA: Diagnosis not present

## 2019-09-25 DIAGNOSIS — D630 Anemia in neoplastic disease: Secondary | ICD-10-CM | POA: Diagnosis not present

## 2019-09-25 DIAGNOSIS — Z452 Encounter for adjustment and management of vascular access device: Secondary | ICD-10-CM | POA: Diagnosis not present

## 2019-09-25 DIAGNOSIS — J910 Malignant pleural effusion: Secondary | ICD-10-CM | POA: Diagnosis not present

## 2019-09-25 DIAGNOSIS — C3491 Malignant neoplasm of unspecified part of right bronchus or lung: Secondary | ICD-10-CM

## 2019-09-25 MED ORDER — PEGFILGRASTIM-JMDB 6 MG/0.6ML ~~LOC~~ SOSY
PREFILLED_SYRINGE | SUBCUTANEOUS | Status: AC
  Filled 2019-09-25: qty 0.6

## 2019-09-25 MED ORDER — PEGFILGRASTIM-JMDB 6 MG/0.6ML ~~LOC~~ SOSY
6.0000 mg | PREFILLED_SYRINGE | Freq: Once | SUBCUTANEOUS | Status: AC
  Administered 2019-09-25: 6 mg via SUBCUTANEOUS

## 2019-09-25 NOTE — Telephone Encounter (Signed)
Scheduled per los. Called and left msg about added appts. Mailed printout

## 2019-09-25 NOTE — Patient Instructions (Signed)

## 2019-09-29 ENCOUNTER — Other Ambulatory Visit: Payer: Self-pay

## 2019-09-29 ENCOUNTER — Encounter: Payer: Self-pay | Admitting: Radiation Oncology

## 2019-09-29 ENCOUNTER — Ambulatory Visit
Admission: RE | Admit: 2019-09-29 | Discharge: 2019-09-29 | Disposition: A | Discharge status: Home / Self Care | Payer: Medicare Other | Source: Ambulatory Visit | Attending: Radiation Oncology | Admitting: Radiation Oncology

## 2019-09-29 DIAGNOSIS — G4700 Insomnia, unspecified: Secondary | ICD-10-CM | POA: Insufficient documentation

## 2019-09-29 DIAGNOSIS — Z79899 Other long term (current) drug therapy: Secondary | ICD-10-CM | POA: Diagnosis not present

## 2019-09-29 DIAGNOSIS — R21 Rash and other nonspecific skin eruption: Secondary | ICD-10-CM | POA: Insufficient documentation

## 2019-09-29 DIAGNOSIS — M79606 Pain in leg, unspecified: Secondary | ICD-10-CM | POA: Insufficient documentation

## 2019-09-29 DIAGNOSIS — J90 Pleural effusion, not elsewhere classified: Secondary | ICD-10-CM | POA: Insufficient documentation

## 2019-09-29 DIAGNOSIS — I480 Paroxysmal atrial fibrillation: Secondary | ICD-10-CM | POA: Diagnosis not present

## 2019-09-29 DIAGNOSIS — R197 Diarrhea, unspecified: Secondary | ICD-10-CM | POA: Insufficient documentation

## 2019-09-29 DIAGNOSIS — K5730 Diverticulosis of large intestine without perforation or abscess without bleeding: Secondary | ICD-10-CM | POA: Insufficient documentation

## 2019-09-29 DIAGNOSIS — Z923 Personal history of irradiation: Secondary | ICD-10-CM | POA: Diagnosis not present

## 2019-09-29 DIAGNOSIS — I700 Atherosclerosis of aorta: Secondary | ICD-10-CM | POA: Insufficient documentation

## 2019-09-29 DIAGNOSIS — C3491 Malignant neoplasm of unspecified part of right bronchus or lung: Secondary | ICD-10-CM

## 2019-09-29 DIAGNOSIS — C3431 Malignant neoplasm of lower lobe, right bronchus or lung: Secondary | ICD-10-CM | POA: Insufficient documentation

## 2019-09-29 DIAGNOSIS — I2510 Atherosclerotic heart disease of native coronary artery without angina pectoris: Secondary | ICD-10-CM | POA: Diagnosis not present

## 2019-09-29 DIAGNOSIS — Z7984 Long term (current) use of oral hypoglycemic drugs: Secondary | ICD-10-CM | POA: Diagnosis not present

## 2019-09-29 NOTE — Progress Notes (Signed)
Radiation Oncology         [phone] ________________________________  Name: Jeremy [name] MRN: [id number]  Date: 09/29/2019  DOB: 11/26/1945  Follow-Up Visit Note  CC: [name], MD  [name], [name], MD    ICD-10-CM   1. 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  Radiation Treatment Dates: 08/11/2019 through 08/28/2019 Site Technique Total Dose (Gy) Dose per Fx (Gy) Completed Fx Beam Energies  Lung, Right: Lung_Rt 3D 35/35 2.5 14/14 6X, 10X, 15X    Narrative:  The patient returns today for routine follow-up. Of note, the patient was seen in the ED on 09/01/2019 for suspected atrial fibrillation that was seen on his home-monitor by his wife. However, the patient was in normal sinus rhythm when EMS arrived and was asymptomatic. The patient was discharged from the ED with diagnosis of paroxsymal atrial fibrillation.  PET scan on 09/08/2019 showed a response to therapy of the central right lower lobe lung mass. There was persistent hypermetabolic anterior right lung base nodularity, which remained suspicious for metastatic disease. There was no thoracic nodal hypermetabolism. However, there was esophageal hypermetabolism, possibly representing radiation-induced esophagitis, in addition to diffuse marrow hypermetabolism, presumably due to marrow stimulation by chemotherapy.  The patient was last seen by [facility], PA-C, on 09/23/2019. He is undergoing systemic chemotherapy with Carboplatin, Paclitaxel, and Keytruda with Neulasta support. He is now status post two cycles. However, [name] was held for cycle #2 secondary to skin rash.  On review of systems, he reports bilateral hip and leg pain related to injection he received on Thursday. He also reports productive cough with brownish sputum  that is improving, diarrhea managed with Imodium, difficulty sleeping, and a diffuse rash. He denies hemoptysis and difficulty swallowing.  ALLERGIES:  is allergic to aleve [naproxen sodium].  Meds: Current Outpatient Medications  Medication Sig Dispense Refill  . acetaminophen (TYLENOL) 500 MG tablet Take 500 mg by mouth every 4 (four) hours as needed (for pain.).    Marland Kitchen albuterol (VENTOLIN HFA) 108 (90 Base) MCG/ACT inhaler Inhale 2 puffs into the lungs every 6 (six) hours as needed for wheezing or shortness of breath.    . Ascorbic Acid (VITAMIN C) 1000 MG tablet Take 1,000 mg by mouth daily.    Marland Kitchen diltiazem (CARDIZEM) 30 MG tablet Take 1 tablet (30 mg total) by mouth 2 (two) times daily. 180 tablet 1  . docusate sodium (COLACE) 100 MG capsule Take 100 mg by mouth 2 (two) times daily.    Marland Kitchen doxycycline (MONODOX) 50 MG capsule Take 50 mg by mouth 2 (two) times daily as needed (rosacea flare ups.).    Marland Kitchen ferrous sulfate 325 (65 FE) MG tablet Take 325 mg by mouth daily with supper.    Marland Kitchen FLOVENT HFA 110 MCG/ACT inhaler 1 puff 2 (two) times daily.    . folic acid (FOLVITE) 1 MG tablet Take 1 mg by mouth daily.    Marland Kitchen gabapentin (NEURONTIN) 300 MG capsule Take 600 mg by mouth at bedtime.    . lidocaine-prilocaine (EMLA) cream Apply to the Port-A-Cath site 30 minutes before treatment 30 g 0  . loratadine (CLARITIN) 10 MG tablet Take 10 mg by mouth daily.    . metFORMIN (GLUCOPHAGE-XR) 500 MG 24 hr tablet Take 500-1,000 mg by mouth See admin instructions. Take  2 tablets (1000 mg) by mouth in the morning & 1 tablet (500 mg) by mouth at night.    . metroNIDAZOLE (METROCREAM) 0.75 % cream Apply 1 application topically 2 (two) times daily as needed (facial rosacea).     . mometasone (ELOCON) 0.1 % cream Apply 1 application topically 2 (two) times daily as needed (psoriasis).     . Omega-3 Fatty Acids (FISH OIL) 1000 MG CAPS Take 1,000 mg by mouth daily.     . predniSONE (DELTASONE) 20 MG tablet Please take 4  tablets (80 mg) daily for one week, followed by 3 tablets (60 mg) daily for 1 week, followed by 2 tablets (20 mg) for one week 70 tablet 0  . prochlorperazine (COMPAZINE) 10 MG tablet Take 1 tablet (10 mg total) by mouth every 6 (six) hours as needed for nausea or vomiting. 30 tablet 0  . traMADol (ULTRAM) 50 MG tablet Take 50 mg by mouth every 4 (four) hours as needed. for pain    . triamcinolone lotion (KENALOG) 0.1 % Apply 1 application topically 4 (four) times daily. 120 mL 1  . umeclidinium-vilanterol (ANORO ELLIPTA) 62.5-25 MCG/INH AEPB Inhale 1 puff into the lungs daily. 30 each 0  . pantoprazole (PROTONIX) 40 MG tablet 1 po 30 mins prior to first meal 30 tablet 11   No current facility-administered medications for this encounter.    Physical Findings: The patient is in no acute distress. Patient is alert and oriented.  height is 6\' 2"  (1.88 m) and weight is 179 lb 9.6 oz (81.5 kg). His temperature is 97.8 F (36.6 C). His blood pressure is 127/73 and his pulse is 92. His respiration is 20 and oxygen saturation is 96%.  No significant changes. Lungs are clear to auscultation bilaterally. Heart has regular rate and rhythm. No palpable cervical, supraclavicular, or axillary adenopathy. Abdomen soft, non-tender, normal bowel sounds.  Hyperpigmentation changes and mild erythema as well as dry desquamation in the radiation portals.  I should mention the patient did have significant skin reaction related to his immunotherapy he continues to have signs of his rash  along his arms.  Lab Findings: Lab Results  Component Value Date   WBC 7.4 09/23/2019   HGB 9.1 (L) 09/23/2019   HCT 29.9 (L) 09/23/2019   MCV 95.5 09/23/2019   PLT 206 09/23/2019    Radiographic Findings: NM PET Image Initial (PI) Skull Base To Thigh  Result Date: 09/08/2019 CLINICAL DATA:  Initial treatment strategy for non-small-cell lung cancer, staging. COVID-19 vaccine 3 weeks ago. Radiation therapy in March. EXAM:  NUCLEAR MEDICINE PET SKULL BASE TO THIGH TECHNIQUE: 8.3 mCi F-18 FDG was injected intravenously. Full-ring PET imaging was performed from the skull base to thigh after the radiotracer. CT data was obtained and used for attenuation correction and anatomic localization. Fasting blood glucose: 84 mg/dl COMPARISON:  CT chest 08/06/2019.  Abdominopelvic CT 321. FINDINGS: Mediastinal blood pool activity: SUV max 2.0 Liver activity: SUV max NA NECK: No areas of abnormal hypermetabolism. Incidental CT findings: No cervical adenopathy. CHEST: No thoracic nodal hypermetabolism. Multifocal esophageal hypermetabolism may be radiation induced. Example at a S.U.V. max of 5.4 including on 82/4. Significant improvement since 08/06/2019 in previously described necrotic central right lower lobe lung mass. Residual peripherally hypermetabolic mass measures on the order of 6.2 x 4.7 cm and a S.U.V. max of 4.4 including on 94/4. Direct mediastinal extension has improved. Hypermetabolism corresponding to inferior right upper lobe nodularity and interstitial thickening. Example hypermetabolic nodule at 5  mm and a S.U.V. max of 2.8 on 90/4. Incidental CT findings: Right Port-A-Cath tip low SVC. Aortic and coronary artery atherosclerosis. Small right pleural effusion persists. Centrilobular emphysema. Left lower lobe 4 mm nodule anteriorly on 34/8, present previously. ABDOMEN/PELVIS: No abdominopelvic parenchymal or nodal hypermetabolism. Incidental CT findings: Normal adrenal glands. Large colonic stool burden. Abdominal aortic atherosclerosis. Extensive colonic diverticulosis. SKELETON: There is diffuse marrow hypermetabolism, save relative photopenia at the level of mid to lower thoracic radiation exposure. Incidental CT findings: none IMPRESSION: 1. Since the CT of 08/06/2019, response to therapy of central right lower lobe lung mass. Persistent hypermetabolic anterior right lung base nodularity, which remains suspicious for metastatic  disease. 2. No thoracic nodal hypermetabolism. 3. Esophageal hypermetabolism, possibly representing radiation induced esophagitis. 4. Diffuse marrow hypermetabolism, presumably due to marrow stimulation by chemotherapy (pharmacy note describes chemotherapy beginning last week). This limits sensitivity for osseous metastasis. 5. Incidental findings, including: Similar small right pleural effusion. Aortic atherosclerosis (ICD10-I70.0), coronary artery atherosclerosis and emphysema (ICD10-J43.9). Possible constipation. Electronically Signed   By: Abigail Miyamoto M.D.   On: 09/08/2019 14:39   IR IMAGING GUIDED PORT INSERTION  Result Date: 09/04/2019 INDICATION: 74 year old male with stage IV squamous cell carcinoma of the right lung. He presents for port catheter placement EXAM: IMPLANTED PORT A CATH PLACEMENT WITH ULTRASOUND AND FLUOROSCOPIC GUIDANCE MEDICATIONS: 2 g Ancef; The antibiotic was administered within an appropriate time interval prior to skin puncture. ANESTHESIA/SEDATION: Versed 2 mg IV; Fentanyl 100 mcg IV; Moderate Sedation Time:  26 minutes The patient was continuously monitored during the procedure by the interventional radiology nurse under my direct supervision. FLUOROSCOPY TIME:  1 minutes, 30 seconds (12 mGy) COMPLICATIONS: None immediate. PROCEDURE: The right neck and chest was prepped with chlorhexidine, and draped in the usual sterile fashion using maximum barrier technique (cap and mask, sterile gown, sterile gloves, large sterile sheet, hand hygiene and cutaneous antiseptic). Local anesthesia was attained by infiltration with 1% lidocaine with epinephrine. Ultrasound demonstrated patency of the right internal jugular vein, and this was documented with an image. Under real-time ultrasound guidance, this vein was accessed with a 21 gauge micropuncture needle and image documentation was performed. A small dermatotomy was made at the access site with an 11 scalpel. A 0.018" wire was advanced into  the SVC and the access needle exchanged for a 31F micropuncture vascular sheath. The 0.018" wire was then removed and a 0.035" wire advanced into the IVC. An appropriate location for the subcutaneous reservoir was selected below the clavicle and an incision was made through the skin and underlying soft tissues. The subcutaneous tissues were then dissected using a combination of blunt and sharp surgical technique and a pocket was formed. A single lumen power injectable portacatheter was then tunneled through the subcutaneous tissues from the pocket to the dermatotomy and the port reservoir placed within the subcutaneous pocket. The venous access site was then serially dilated and a peel away vascular sheath placed over the wire. The wire was removed and the port catheter advanced into position under fluoroscopic guidance. The catheter tip is positioned in the superior cavoatrial junction. This was documented with a spot image. The portacatheter was then tested and found to flush and aspirate well. The port was flushed with saline followed by 100 units/mL heparinized saline. The pocket was then closed in two layers using first subdermal inverted interrupted absorbable sutures followed by a running subcuticular suture. The epidermis was then sealed with Dermabond. The dermatotomy at the venous access site was  also closed with Dermabond. IMPRESSION: Successful placement of a right IJ approach Power Port with ultrasound and fluoroscopic guidance. The catheter is ready for use. Electronically Signed   By: Jacqulynn Cadet M.D.   On: 09/04/2019 16:42    Impression: Locally advanced squamous cell carcinoma of the rightlowerlung, Stage III-B (T4, N2, Mx)  The patient is recovering from the effects of radiation.  favorable response to treatment based on his PET scan  Plan: The patient is scheduled to see Dr. Julien Nordmann on 10/14/2019.  As needed follow-up in radiation  oncology.  ____________________________________   Blair Promise, PhD, MD  This document serves as a record of services personally performed by Gery Pray, MD. It was created on his behalf by Clerance Lav, a trained medical scribe. The creation of this record is based on the scribe's personal observations and the provider's statements to them. This document has been checked and approved by the attending provider.

## 2019-09-29 NOTE — Progress Notes (Signed)
Weight and vitals stable. Reports pain bilateral hips and legs related to injection he received on Thursday. Reports frequency of cough has decreased. Reports a productive cough with brownish sputum. Denies hemoptysis. Denies pain or difficulty swallowing. Rash head to toe noted. Patient reports [name] believes his rash is related to [facility]. [name] was stopped and patient was placed on steroid taper. Reports diarrhea. Denies blood in stool. Reports taking Imodium to manage diarrhea. Reports continued difficulty sleeping. Reports a good normal appetite.  BP 127/73   Pulse 92   Temp 97.8 F (36.6 C)   Resp 20   Ht 6\' 2"  (1.88 m)   Wt 176 lb (79.8 kg)   SpO2 96%   BMI 22.60 kg/m  Wt Readings from Last 3 Encounters:  09/29/19 176 lb (79.8 kg)  09/23/19 173 lb 3.2 oz (78.6 kg)  09/19/19 173 lb (78.5 kg)

## 2019-09-30 ENCOUNTER — Inpatient Hospital Stay: Payer: Medicare Other

## 2019-09-30 ENCOUNTER — Other Ambulatory Visit: Payer: Self-pay

## 2019-09-30 DIAGNOSIS — Z95828 Presence of other vascular implants and grafts: Secondary | ICD-10-CM

## 2019-09-30 DIAGNOSIS — C3491 Malignant neoplasm of unspecified part of right bronchus or lung: Secondary | ICD-10-CM

## 2019-09-30 DIAGNOSIS — Z5111 Encounter for antineoplastic chemotherapy: Secondary | ICD-10-CM | POA: Diagnosis not present

## 2019-09-30 DIAGNOSIS — J910 Malignant pleural effusion: Secondary | ICD-10-CM | POA: Diagnosis not present

## 2019-09-30 DIAGNOSIS — Z452 Encounter for adjustment and management of vascular access device: Secondary | ICD-10-CM | POA: Diagnosis not present

## 2019-09-30 DIAGNOSIS — C3431 Malignant neoplasm of lower lobe, right bronchus or lung: Secondary | ICD-10-CM | POA: Diagnosis not present

## 2019-09-30 DIAGNOSIS — Z7689 Persons encountering health services in other specified circumstances: Secondary | ICD-10-CM | POA: Diagnosis not present

## 2019-09-30 DIAGNOSIS — D630 Anemia in neoplastic disease: Secondary | ICD-10-CM | POA: Diagnosis not present

## 2019-09-30 LAB — CMP (CANCER CENTER ONLY)
ALT: 16 U/L (ref 0–44)
AST: 13 U/L — ABNORMAL LOW (ref 15–41)
Albumin: 2.9 g/dL — ABNORMAL LOW (ref 3.5–5.0)
Alkaline Phosphatase: 169 U/L — ABNORMAL HIGH (ref 38–126)
Anion gap: 11 (ref 5–15)
BUN: 29 mg/dL — ABNORMAL HIGH (ref 8–23)
CO2: 27 mmol/L (ref 22–32)
Calcium: 8.9 mg/dL (ref 8.9–10.3)
Chloride: 104 mmol/L (ref 98–111)
Creatinine: 0.81 mg/dL (ref 0.61–1.24)
GFR, Est AFR Am: >60 mL/min (ref >60)
GFR, Estimated: >60 mL/min (ref >60)
Glucose, Bld: 228 mg/dL — ABNORMAL HIGH (ref 70–99)
Potassium: 4.7 mmol/L (ref 3.5–5.1)
Sodium: 142 mmol/L (ref 135–145)
Total Bilirubin: <0.2 mg/dL — ABNORMAL LOW (ref 0.3–1.2)
Total Protein: 7 g/dL (ref 6.5–8.1)

## 2019-09-30 LAB — CBC WITH DIFFERENTIAL (CANCER CENTER ONLY)
Abs Immature Granulocytes: 3.17 10*3/uL — ABNORMAL HIGH (ref 0.00–0.07)
Basophils Absolute: 0 10*3/uL (ref 0.0–0.1)
Basophils Relative: 0 %
Eosinophils Absolute: 0 10*3/uL (ref 0.0–0.5)
Eosinophils Relative: 0 %
HCT: 28.9 % — ABNORMAL LOW (ref 39.0–52.0)
Hemoglobin: 8.9 g/dL — ABNORMAL LOW (ref 13.0–17.0)
Immature Granulocytes: 10 %
Lymphocytes Relative: 3 %
Lymphs Abs: 1 10*3/uL (ref 0.7–4.0)
MCH: 29.2 pg (ref 26.0–34.0)
MCHC: 30.8 g/dL (ref 30.0–36.0)
MCV: 94.8 fL (ref 80.0–100.0)
Monocytes Absolute: 1.6 10*3/uL — ABNORMAL HIGH (ref 0.1–1.0)
Monocytes Relative: 5 %
Neutro Abs: 26.4 10*3/uL — ABNORMAL HIGH (ref 1.7–7.7)
Neutrophils Relative %: 82 %
Platelet Count: 224 10*3/uL (ref 150–400)
RBC: 3.05 MIL/uL — ABNORMAL LOW (ref 4.22–5.81)
RDW: 20.4 % — ABNORMAL HIGH (ref 11.5–15.5)
WBC Count: 32.2 10*3/uL — ABNORMAL HIGH (ref 4.0–10.5)
nRBC: 0.2 % (ref 0.0–0.2)

## 2019-09-30 LAB — SAMPLE TO BLOOD BANK

## 2019-09-30 MED ORDER — HEPARIN SOD (PORK) LOCK FLUSH 100 UNIT/ML IV SOLN
500.0000 [IU] | Freq: Once | INTRAVENOUS | Status: AC
  Administered 2019-09-30: 10:00:00 500 [IU] via INTRAVENOUS
  Filled 2019-09-30: qty 5

## 2019-09-30 MED ORDER — SODIUM CHLORIDE 0.9% FLUSH
10.0000 mL | INTRAVENOUS | Status: DC | PRN
  Administered 2019-09-30: 10:00:00 10 mL via INTRAVENOUS
  Filled 2019-09-30: qty 10

## 2019-10-01 ENCOUNTER — Telehealth: Payer: Self-pay | Admitting: Medical Oncology

## 2019-10-01 NOTE — Telephone Encounter (Signed)
Diarrhea- "loose 2-3 times in am . Started  3 days after his last infusion. Denies abdominal pain except cramping in mornings. I  Instructed her to give pt 2 imodium ad after first diarrhea stool then one after each successive diarrhea stool up to 6 /day and to keep hydrated. She was instructed to call for uncontrolled diarrhea , new abdominal pain , fever, vomiting.

## 2019-10-07 ENCOUNTER — Inpatient Hospital Stay: Payer: Medicare Other | Attending: Internal Medicine

## 2019-10-07 ENCOUNTER — Inpatient Hospital Stay: Payer: Medicare Other

## 2019-10-07 ENCOUNTER — Other Ambulatory Visit: Payer: Self-pay

## 2019-10-07 DIAGNOSIS — C3431 Malignant neoplasm of lower lobe, right bronchus or lung: Secondary | ICD-10-CM | POA: Diagnosis not present

## 2019-10-07 DIAGNOSIS — D6481 Anemia due to antineoplastic chemotherapy: Secondary | ICD-10-CM | POA: Diagnosis not present

## 2019-10-07 DIAGNOSIS — Z452 Encounter for adjustment and management of vascular access device: Secondary | ICD-10-CM | POA: Diagnosis not present

## 2019-10-07 DIAGNOSIS — Z9221 Personal history of antineoplastic chemotherapy: Secondary | ICD-10-CM | POA: Insufficient documentation

## 2019-10-07 DIAGNOSIS — Z95828 Presence of other vascular implants and grafts: Secondary | ICD-10-CM

## 2019-10-07 DIAGNOSIS — I10 Essential (primary) hypertension: Secondary | ICD-10-CM | POA: Diagnosis not present

## 2019-10-07 DIAGNOSIS — Z7952 Long term (current) use of systemic steroids: Secondary | ICD-10-CM | POA: Diagnosis not present

## 2019-10-07 DIAGNOSIS — Z7951 Long term (current) use of inhaled steroids: Secondary | ICD-10-CM | POA: Insufficient documentation

## 2019-10-07 DIAGNOSIS — R5383 Other fatigue: Secondary | ICD-10-CM | POA: Insufficient documentation

## 2019-10-07 DIAGNOSIS — T451X5A Adverse effect of antineoplastic and immunosuppressive drugs, initial encounter: Secondary | ICD-10-CM | POA: Diagnosis not present

## 2019-10-07 DIAGNOSIS — R21 Rash and other nonspecific skin eruption: Secondary | ICD-10-CM | POA: Insufficient documentation

## 2019-10-07 DIAGNOSIS — Z7984 Long term (current) use of oral hypoglycemic drugs: Secondary | ICD-10-CM | POA: Insufficient documentation

## 2019-10-07 DIAGNOSIS — Z923 Personal history of irradiation: Secondary | ICD-10-CM | POA: Insufficient documentation

## 2019-10-07 DIAGNOSIS — J449 Chronic obstructive pulmonary disease, unspecified: Secondary | ICD-10-CM | POA: Insufficient documentation

## 2019-10-07 DIAGNOSIS — E1136 Type 2 diabetes mellitus with diabetic cataract: Secondary | ICD-10-CM | POA: Insufficient documentation

## 2019-10-07 DIAGNOSIS — Z79899 Other long term (current) drug therapy: Secondary | ICD-10-CM | POA: Insufficient documentation

## 2019-10-07 DIAGNOSIS — C3491 Malignant neoplasm of unspecified part of right bronchus or lung: Secondary | ICD-10-CM

## 2019-10-07 LAB — CMP (CANCER CENTER ONLY)
ALT: 11 U/L (ref 0–44)
AST: 9 U/L — ABNORMAL LOW (ref 15–41)
Albumin: 2.6 g/dL — ABNORMAL LOW (ref 3.5–5.0)
Alkaline Phosphatase: 115 U/L (ref 38–126)
Anion gap: 13 (ref 5–15)
BUN: 24 mg/dL — ABNORMAL HIGH (ref 8–23)
CO2: 28 mmol/L (ref 22–32)
Calcium: 8.7 mg/dL — ABNORMAL LOW (ref 8.9–10.3)
Chloride: 103 mmol/L (ref 98–111)
Creatinine: 0.79 mg/dL (ref 0.61–1.24)
GFR, Est AFR Am: >60 mL/min (ref >60)
GFR, Estimated: >60 mL/min (ref >60)
Glucose, Bld: 129 mg/dL — ABNORMAL HIGH (ref 70–99)
Potassium: 3.9 mmol/L (ref 3.5–5.1)
Sodium: 144 mmol/L (ref 135–145)
Total Bilirubin: 0.5 mg/dL (ref 0.3–1.2)
Total Protein: 6.9 g/dL (ref 6.5–8.1)

## 2019-10-07 LAB — SAMPLE TO BLOOD BANK

## 2019-10-07 LAB — CBC WITH DIFFERENTIAL (CANCER CENTER ONLY)
Abs Immature Granulocytes: 0.29 10*3/uL — ABNORMAL HIGH (ref 0.00–0.07)
Basophils Absolute: 0 10*3/uL (ref 0.0–0.1)
Basophils Relative: 0 %
Eosinophils Absolute: 0 10*3/uL (ref 0.0–0.5)
Eosinophils Relative: 0 %
HCT: 29.5 % — ABNORMAL LOW (ref 39.0–52.0)
Hemoglobin: 9 g/dL — ABNORMAL LOW (ref 13.0–17.0)
Immature Granulocytes: 2 %
Lymphocytes Relative: 4 %
Lymphs Abs: 0.7 10*3/uL (ref 0.7–4.0)
MCH: 28.7 pg (ref 26.0–34.0)
MCHC: 30.5 g/dL (ref 30.0–36.0)
MCV: 93.9 fL (ref 80.0–100.0)
Monocytes Absolute: 0.7 10*3/uL (ref 0.1–1.0)
Monocytes Relative: 4 %
Neutro Abs: 15.6 10*3/uL — ABNORMAL HIGH (ref 1.7–7.7)
Neutrophils Relative %: 90 %
Platelet Count: 161 10*3/uL (ref 150–400)
RBC: 3.14 MIL/uL — ABNORMAL LOW (ref 4.22–5.81)
RDW: 20.9 % — ABNORMAL HIGH (ref 11.5–15.5)
WBC Count: 17.3 10*3/uL — ABNORMAL HIGH (ref 4.0–10.5)
nRBC: 0 % (ref 0.0–0.2)

## 2019-10-07 MED ORDER — SODIUM CHLORIDE 0.9% FLUSH
10.0000 mL | INTRAVENOUS | Status: DC | PRN
  Administered 2019-10-07: 10 mL via INTRAVENOUS
  Filled 2019-10-07: qty 10

## 2019-10-07 MED ORDER — HEPARIN SOD (PORK) LOCK FLUSH 100 UNIT/ML IV SOLN
500.0000 [IU] | Freq: Once | INTRAVENOUS | Status: AC
  Administered 2019-10-07: 10:00:00 500 [IU] via INTRAVENOUS
  Filled 2019-10-07: qty 5

## 2019-10-08 NOTE — Progress Notes (Signed)
Pharmacist Chemotherapy Monitoring - Follow Up Assessment    I verify that I have reviewed each item in the below checklist:  . Regimen for the patient is scheduled for the appropriate day and plan matches scheduled date. Marland Kitchen Appropriate non-routine labs are ordered dependent on drug ordered. . If applicable, additional medications reviewed and ordered per protocol based on lifetime cumulative doses and/or treatment regimen.   Plan for follow-up and/or issues identified: No . I-vent associated with next due treatment: No . MD and/or nursing notified: No  Areon Cocuzza D 10/08/2019 4:30 PM

## 2019-10-09 ENCOUNTER — Ambulatory Visit (INDEPENDENT_AMBULATORY_CARE_PROVIDER_SITE_OTHER): Payer: Medicare Other

## 2019-10-09 ENCOUNTER — Other Ambulatory Visit: Payer: Self-pay

## 2019-10-09 DIAGNOSIS — I480 Paroxysmal atrial fibrillation: Secondary | ICD-10-CM

## 2019-10-14 ENCOUNTER — Encounter: Payer: Self-pay | Admitting: Internal Medicine

## 2019-10-14 ENCOUNTER — Other Ambulatory Visit: Payer: Self-pay

## 2019-10-14 ENCOUNTER — Inpatient Hospital Stay (HOSPITAL_BASED_OUTPATIENT_CLINIC_OR_DEPARTMENT_OTHER): Payer: Medicare Other | Admitting: Internal Medicine

## 2019-10-14 ENCOUNTER — Other Ambulatory Visit: Payer: Self-pay | Admitting: Medical Oncology

## 2019-10-14 ENCOUNTER — Inpatient Hospital Stay: Payer: Medicare Other

## 2019-10-14 ENCOUNTER — Telehealth: Payer: Self-pay | Admitting: Internal Medicine

## 2019-10-14 VITALS — BP 121/93 | HR 102 | Temp 97.8°F | Resp 20 | Ht 74.0 in | Wt 182.3 lb

## 2019-10-14 VITALS — BP 144/88 | HR 93

## 2019-10-14 DIAGNOSIS — Z20822 Contact with and (suspected) exposure to covid-19: Secondary | ICD-10-CM | POA: Diagnosis not present

## 2019-10-14 DIAGNOSIS — I10 Essential (primary) hypertension: Secondary | ICD-10-CM

## 2019-10-14 DIAGNOSIS — R627 Adult failure to thrive: Secondary | ICD-10-CM | POA: Diagnosis not present

## 2019-10-14 DIAGNOSIS — J449 Chronic obstructive pulmonary disease, unspecified: Secondary | ICD-10-CM | POA: Diagnosis not present

## 2019-10-14 DIAGNOSIS — C3491 Malignant neoplasm of unspecified part of right bronchus or lung: Secondary | ICD-10-CM | POA: Diagnosis not present

## 2019-10-14 DIAGNOSIS — Z5111 Encounter for antineoplastic chemotherapy: Secondary | ICD-10-CM | POA: Diagnosis not present

## 2019-10-14 DIAGNOSIS — D630 Anemia in neoplastic disease: Secondary | ICD-10-CM | POA: Diagnosis not present

## 2019-10-14 DIAGNOSIS — Z95828 Presence of other vascular implants and grafts: Secondary | ICD-10-CM

## 2019-10-14 DIAGNOSIS — D649 Anemia, unspecified: Secondary | ICD-10-CM | POA: Diagnosis not present

## 2019-10-14 DIAGNOSIS — C3490 Malignant neoplasm of unspecified part of unspecified bronchus or lung: Secondary | ICD-10-CM | POA: Diagnosis not present

## 2019-10-14 DIAGNOSIS — J910 Malignant pleural effusion: Secondary | ICD-10-CM | POA: Diagnosis not present

## 2019-10-14 DIAGNOSIS — R531 Weakness: Secondary | ICD-10-CM | POA: Diagnosis not present

## 2019-10-14 DIAGNOSIS — C3431 Malignant neoplasm of lower lobe, right bronchus or lung: Secondary | ICD-10-CM | POA: Diagnosis not present

## 2019-10-14 DIAGNOSIS — T451X5A Adverse effect of antineoplastic and immunosuppressive drugs, initial encounter: Secondary | ICD-10-CM | POA: Diagnosis not present

## 2019-10-14 DIAGNOSIS — J850 Gangrene and necrosis of lung: Secondary | ICD-10-CM | POA: Diagnosis not present

## 2019-10-14 DIAGNOSIS — R5382 Chronic fatigue, unspecified: Secondary | ICD-10-CM

## 2019-10-14 LAB — CBC WITH DIFFERENTIAL (CANCER CENTER ONLY)
Abs Immature Granulocytes: 0.1 10*3/uL — ABNORMAL HIGH (ref 0.00–0.07)
Basophils Absolute: 0 10*3/uL (ref 0.0–0.1)
Basophils Relative: 0 %
Eosinophils Absolute: 0 10*3/uL (ref 0.0–0.5)
Eosinophils Relative: 0 %
HCT: 27.6 % — ABNORMAL LOW (ref 39.0–52.0)
Hemoglobin: 8.5 g/dL — ABNORMAL LOW (ref 13.0–17.0)
Immature Granulocytes: 1 %
Lymphocytes Relative: 2 %
Lymphs Abs: 0.3 10*3/uL — ABNORMAL LOW (ref 0.7–4.0)
MCH: 29.5 pg (ref 26.0–34.0)
MCHC: 30.8 g/dL (ref 30.0–36.0)
MCV: 95.8 fL (ref 80.0–100.0)
Monocytes Absolute: 0.3 10*3/uL (ref 0.1–1.0)
Monocytes Relative: 2 %
Neutro Abs: 12.9 10*3/uL — ABNORMAL HIGH (ref 1.7–7.7)
Neutrophils Relative %: 95 %
Platelet Count: 197 10*3/uL (ref 150–400)
RBC: 2.88 MIL/uL — ABNORMAL LOW (ref 4.22–5.81)
RDW: 19.3 % — ABNORMAL HIGH (ref 11.5–15.5)
WBC Count: 13.6 10*3/uL — ABNORMAL HIGH (ref 4.0–10.5)
nRBC: 0 % (ref 0.0–0.2)

## 2019-10-14 LAB — CMP (CANCER CENTER ONLY)
ALT: 17 U/L (ref 0–44)
AST: 9 U/L — ABNORMAL LOW (ref 15–41)
Albumin: 2.1 g/dL — ABNORMAL LOW (ref 3.5–5.0)
Alkaline Phosphatase: 87 U/L (ref 38–126)
Anion gap: 12 (ref 5–15)
BUN: 24 mg/dL — ABNORMAL HIGH (ref 8–23)
CO2: 25 mmol/L (ref 22–32)
Calcium: 8.9 mg/dL (ref 8.9–10.3)
Chloride: 102 mmol/L (ref 98–111)
Creatinine: 0.86 mg/dL (ref 0.61–1.24)
GFR, Est AFR Am: >60 mL/min (ref >60)
GFR, Estimated: >60 mL/min (ref >60)
Glucose, Bld: 280 mg/dL — ABNORMAL HIGH (ref 70–99)
Potassium: 4 mmol/L (ref 3.5–5.1)
Sodium: 139 mmol/L (ref 135–145)
Total Bilirubin: 0.3 mg/dL (ref 0.3–1.2)
Total Protein: 7.3 g/dL (ref 6.5–8.1)

## 2019-10-14 LAB — TSH: TSH: 0.725 u[IU]/mL (ref 0.320–4.118)

## 2019-10-14 MED ORDER — SODIUM CHLORIDE 0.9 % IV SOLN
150.0000 mg | Freq: Once | INTRAVENOUS | Status: AC
  Administered 2019-10-14: 150 mg via INTRAVENOUS
  Filled 2019-10-14: qty 150

## 2019-10-14 MED ORDER — SODIUM CHLORIDE 0.9 % IV SOLN
Freq: Once | INTRAVENOUS | Status: AC
  Filled 2019-10-14: qty 250

## 2019-10-14 MED ORDER — DIPHENHYDRAMINE HCL 50 MG/ML IJ SOLN
50.0000 mg | Freq: Once | INTRAMUSCULAR | Status: AC
  Administered 2019-10-14: 50 mg via INTRAVENOUS

## 2019-10-14 MED ORDER — FAMOTIDINE IN NACL 20-0.9 MG/50ML-% IV SOLN
20.0000 mg | Freq: Once | INTRAVENOUS | Status: AC
  Administered 2019-10-14: 20 mg via INTRAVENOUS

## 2019-10-14 MED ORDER — PALONOSETRON HCL INJECTION 0.25 MG/5ML
0.2500 mg | Freq: Once | INTRAVENOUS | Status: AC
  Administered 2019-10-14: 0.25 mg via INTRAVENOUS

## 2019-10-14 MED ORDER — SODIUM CHLORIDE 0.9 % IV SOLN
175.0000 mg/m2 | Freq: Once | INTRAVENOUS | Status: AC
  Administered 2019-10-14: 348 mg via INTRAVENOUS
  Filled 2019-10-14: qty 58

## 2019-10-14 MED ORDER — FAMOTIDINE IN NACL 20-0.9 MG/50ML-% IV SOLN
INTRAVENOUS | Status: AC
  Filled 2019-10-14: qty 50

## 2019-10-14 MED ORDER — PALONOSETRON HCL INJECTION 0.25 MG/5ML
INTRAVENOUS | Status: AC
  Filled 2019-10-14: qty 5

## 2019-10-14 MED ORDER — SODIUM CHLORIDE 0.9 % IV SOLN
461.5000 mg | Freq: Once | INTRAVENOUS | Status: AC
  Administered 2019-10-14: 460 mg via INTRAVENOUS
  Filled 2019-10-14: qty 46

## 2019-10-14 MED ORDER — SODIUM CHLORIDE 0.9% FLUSH
10.0000 mL | INTRAVENOUS | Status: DC | PRN
  Administered 2019-10-14: 10 mL
  Filled 2019-10-14: qty 10

## 2019-10-14 MED ORDER — DIPHENHYDRAMINE HCL 50 MG/ML IJ SOLN
INTRAMUSCULAR | Status: AC
  Filled 2019-10-14: qty 1

## 2019-10-14 MED ORDER — SODIUM CHLORIDE 0.9% FLUSH
10.0000 mL | INTRAVENOUS | Status: DC | PRN
  Filled 2019-10-14: qty 10

## 2019-10-14 MED ORDER — HEPARIN SOD (PORK) LOCK FLUSH 100 UNIT/ML IV SOLN
500.0000 [IU] | Freq: Once | INTRAVENOUS | Status: AC | PRN
  Administered 2019-10-14: 500 [IU]
  Filled 2019-10-14: qty 5

## 2019-10-14 MED ORDER — SODIUM CHLORIDE 0.9 % IV SOLN
10.0000 mg | Freq: Once | INTRAVENOUS | Status: AC
  Administered 2019-10-14: 10 mg via INTRAVENOUS
  Filled 2019-10-14: qty 10

## 2019-10-14 NOTE — Telephone Encounter (Signed)
Scheduled per los. Gave avs and calendar and contrast

## 2019-10-14 NOTE — Patient Instructions (Signed)
[facility] Discharge Instructions for Patients Receiving Chemotherapy  Today you received the following chemotherapy agents: Taxol/Carboplatin.  To help prevent nausea and vomiting after your treatment, we encourage you to take your nausea medication as directed.   If you develop nausea and vomiting that is not controlled by your nausea medication, call the clinic.   BELOW ARE SYMPTOMS THAT SHOULD BE REPORTED IMMEDIATELY:  *FEVER GREATER THAN 100.5 F  *CHILLS WITH OR WITHOUT FEVER  NAUSEA AND VOMITING THAT IS NOT CONTROLLED WITH YOUR NAUSEA MEDICATION  *UNUSUAL SHORTNESS OF BREATH  *UNUSUAL BRUISING OR BLEEDING  TENDERNESS IN MOUTH AND THROAT WITH OR WITHOUT PRESENCE OF ULCERS  *URINARY PROBLEMS  *BOWEL PROBLEMS  UNUSUAL RASH Items with * indicate a potential emergency and should be followed up as soon as possible.  Feel free to call the clinic should you have any questions or concerns. The clinic phone number is [phone].  Please show the [facility] at check-in to the Emergency Department and triage nurse.

## 2019-10-14 NOTE — Progress Notes (Signed)
[place] [phone]   [phone]  OFFICE PROGRESS NOTE  [name], MD [address] 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  PD-L1 expression 20%.  PRIOR THERAPY: Palliative radiotherapy under the care of Dr. Sondra Come to the obstructive right lower lobe lung mass.  CURRENT THERAPY: Systemic chemotherapy with carboplatin for AUC of 5, paclitaxel 175 mg/M2 and Keytruda 200 mg IV every 3 weeks with Neulasta support.  First dose September 02, 2019.  Status post 2 cycles  INTERVAL HISTORY: SEMAJE KINKER 74 y.o. male returns to the clinic today for follow-up visit accompanied by his wife.  The patient is feeling fine today with no concerning complaints except for fatigue as well as the persistent skin rash.  He is currently on a tapered dose of prednisone.  He denied having any current chest pain, shortness of breath, cough or hemoptysis.  He denied having any fever or chills.  He has no nausea, vomiting, diarrhea or constipation.  He has no headache or visual changes.  The patient is here today for evaluation before starting cycle #3.  His treatment with [name] has been on hold starting from cycle #2.   MEDICAL HISTORY: Past Medical History:  Diagnosis Date  . COPD (chronic obstructive pulmonary disease) (HCC)    QUIT SMOKING 30 YRS AGO  . Diabetes ([place]) 2018  . HTN (hypertension)   . Psoriatic arthritis ([place]) 2015    ALLERGIES:  is allergic to aleve [naproxen sodium].  MEDICATIONS:  Current Outpatient Medications  Medication Sig Dispense Refill  . acetaminophen (TYLENOL) 500 MG tablet Take 500 mg by mouth every 4 (four) hours as needed (for pain.).    Marland Kitchen albuterol (VENTOLIN HFA) 108 (90 Base) MCG/ACT inhaler Inhale 2 puffs into the lungs every 6 (six) hours as needed for wheezing or shortness of breath.    . Ascorbic  Acid (VITAMIN C) 1000 MG tablet Take 1,000 mg by mouth daily.    Marland Kitchen diltiazem (CARDIZEM) 30 MG tablet Take 1 tablet (30 mg total) by mouth 2 (two) times daily. 180 tablet 1  . docusate sodium (COLACE) 100 MG capsule Take 100 mg by mouth 2 (two) times daily.    Marland Kitchen doxycycline (MONODOX) 50 MG capsule Take 50 mg by mouth 2 (two) times daily as needed (rosacea flare ups.).    Marland Kitchen ferrous sulfate 325 (65 FE) MG tablet Take 325 mg by mouth daily with supper.    Marland Kitchen FLOVENT HFA 110 MCG/ACT inhaler 1 puff 2 (two) times daily.    . folic acid (FOLVITE) 1 MG tablet Take 1 mg by mouth daily.    Marland Kitchen gabapentin (NEURONTIN) 300 MG capsule Take 600 mg by mouth at bedtime.    . lidocaine-prilocaine (EMLA) cream Apply to the Port-A-Cath site 30 minutes before treatment 30 g 0  . loratadine (CLARITIN) 10 MG tablet Take 10 mg by mouth daily.    . metFORMIN (GLUCOPHAGE-XR) 500 MG 24 hr tablet Take 500-1,000 mg by mouth See admin instructions. Take 2 tablets (1000 mg) by mouth in the morning & 1 tablet (500 mg) by mouth at night.    . metroNIDAZOLE (METROCREAM) 0.75 % cream Apply 1 application topically 2 (two) times daily as needed (facial rosacea).     . mometasone (ELOCON) 0.1 % cream Apply 1 application topically 2 (two) times daily as needed (psoriasis).     Marland Kitchen  Omega-3 Fatty Acids (FISH OIL) 1000 MG CAPS Take 1,000 mg by mouth daily.     . pantoprazole (PROTONIX) 40 MG tablet 1 po 30 mins prior to first meal 30 tablet 11  . predniSONE (DELTASONE) 20 MG tablet Please take 4 tablets (80 mg) daily for one week, followed by 3 tablets (60 mg) daily for 1 week, followed by 2 tablets (20 mg) for one week 70 tablet 0  . prochlorperazine (COMPAZINE) 10 MG tablet Take 1 tablet (10 mg total) by mouth every 6 (six) hours as needed for nausea or vomiting. 30 tablet 0  . traMADol (ULTRAM) 50 MG tablet Take 50 mg by mouth every 4 (four) hours as needed. for pain    . triamcinolone lotion (KENALOG) 0.1 % Apply 1 application topically 4  (four) times daily. 120 mL 1  . umeclidinium-vilanterol (ANORO ELLIPTA) 62.5-25 MCG/INH AEPB Inhale 1 puff into the lungs daily. 30 each 0   No current facility-administered medications for this visit.   Facility-Administered Medications Ordered in Other Visits  Medication Dose Route Frequency Provider Last Rate Last Admin  . sodium chloride flush (NS) 0.9 % injection 10 mL  10 mL Intravenous PRN Curt Bears, MD        SURGICAL HISTORY:  Past Surgical History:  Procedure Laterality Date  . BACK SURGERY  1990  . BIOPSY  12/30/2018   Procedure: BIOPSY;  Surgeon: Danie Binder, MD;  [facility];  Service: Endoscopy;;  gastric  . CATARACT EXTRACTION Bilateral   . CERVICAL SPINE SURGERY  1995  . COLONOSCOPY WITH PROPOFOL N/A 12/30/2018   Procedure: COLONOSCOPY WITH PROPOFOL;  Surgeon: Danie Binder, MD;  Location: [facility];  Service: Endoscopy;  Laterality: N/A;  12:30pm  . ESOPHAGOGASTRODUODENOSCOPY (EGD) WITH PROPOFOL N/A 12/30/2018   Procedure: ESOPHAGOGASTRODUODENOSCOPY (EGD) WITH PROPOFOL;  Surgeon: Danie Binder, MD;  [facility];  Service: Endoscopy;  Laterality: N/A;  . IR IMAGING GUIDED PORT INSERTION  09/04/2019  . POLYPECTOMY  12/30/2018   Procedure: POLYPECTOMY;  Surgeon: Danie Binder, MD;  Location: [facility];  Service: Endoscopy;;  colon  . THORACENTESIS Right 08/08/2019   Procedure: Thoracentesis;  Surgeon: [name], MD;  Location: [facility];  Service: Pulmonary;  Laterality: Right;  Marland Kitchen VIDEO BRONCHOSCOPY Right 08/08/2019   Procedure: Video Bronchoscopy with Erbe Cryo Biopsy of Right mainstem;  Surgeon: [name], MD;  Location: [facility];  Service: Pulmonary;  Laterality: Right;    REVIEW OF SYSTEMS:  A comprehensive review of systems was negative except for: Constitutional: positive for fatigue Integument/breast: positive for rash   PHYSICAL EXAMINATION: General appearance: alert, cooperative, fatigued and no distress Head:  Normocephalic, without obvious abnormality, atraumatic Neck: no adenopathy, no JVD, supple, symmetrical, trachea midline and thyroid not enlarged, symmetric, no tenderness/mass/nodules Lymph nodes: Cervical, supraclavicular, and axillary nodes normal. Resp: clear to auscultation bilaterally Back: symmetric, no curvature. ROM normal. No CVA tenderness. Cardio: regular rate and rhythm, S1, S2 normal, no murmur, click, rub or gallop GI: soft, non-tender; bowel sounds normal; no masses,  no organomegaly Extremities: extremities normal, atraumatic, no cyanosis or edema  ECOG PERFORMANCE STATUS: 1 - Symptomatic but completely ambulatory  Blood pressure (!) 121/93, pulse (!) 102, temperature 97.8 F (36.6 C), temperature source Temporal, resp. rate 20, height '6\' 2"'  (1.88 m), weight 182 lb 4.8 oz (82.7 kg), SpO2 92 %.  LABORATORY DATA: Lab Results  Component Value Date   WBC 13.6 (H) 10/14/2019  HGB 8.5 (L) 10/14/2019   HCT 27.6 (L) 10/14/2019   MCV 95.8 10/14/2019   PLT 197 10/14/2019      Chemistry      Component Value Date/Time   NA 144 10/07/2019 0935   K 3.9 10/07/2019 0935   CL 103 10/07/2019 0935   CO2 28 10/07/2019 0935   BUN 24 (H) 10/07/2019 0935   CREATININE 0.79 10/07/2019 0935      Component Value Date/Time   CALCIUM 8.7 (L) 10/07/2019 0935   ALKPHOS 115 10/07/2019 0935   AST 9 (L) 10/07/2019 0935   ALT 11 10/07/2019 0935   BILITOT 0.5 10/07/2019 0935       RADIOGRAPHIC STUDIES: ECHOCARDIOGRAM COMPLETE  Result Date: 10/09/2019    ECHOCARDIOGRAM REPORT   Patient Name:   MAKS CAVALLERO Molder Date of Exam: 10/09/2019 Medical Rec #:  [id number]     Height:       74.0 in Accession #:    [id number]    Weight:       179.6 lb Date of Birth:  [birth year]     BSA:          2.076 m Patient Age:    74 years      BP:           117/71 mmHg Patient Gender: M             HR:           94 bpm. Exam Location:  [facility] Procedure: 2D Echo, Cardiac Doppler and Color Doppler Indications:     I48.0  PAF  History:         Patient has no prior history of Echocardiogram examinations.                  COPD and Stage IV lung cancer. Anemia, Arrythmias:Atrial                  Fibrillation; Risk Factors:Hypertension, Diabetes and Former                  Smoker.  Sonographer:     Jeneen Montgomery RDMS, RVT, RDCS Referring Phys:  [id number] Alphonse Guild BRANCH Diagnosing Phys: Kate Sable MD  Sonographer Comments: Technically difficult study due to poor echo windows. Image acquisition challenging due to COPD and Image acquisition challenging due to patient body habitus. IMPRESSIONS  1. Left ventricular ejection fraction, by estimation, is 60 to 65%. The left ventricle has normal function. Left ventricular endocardial border not optimally defined to evaluate regional wall motion. Left ventricular diastolic parameters are consistent with Grade I diastolic dysfunction (impaired relaxation).  2. Right ventricular systolic function is normal. The right ventricular size is moderately enlarged.  3. Right atrial size was mild to moderately dilated.  4. The mitral valve was not well visualized. Trivial mitral valve regurgitation.  5. Tricuspid valve regurgitation unable to assess.  6. The aortic valve was not well visualized. Aortic valve regurgitation is mild.  7. Pulmonic valve regurgitation unable to assess.  8. Aortic dilatation noted. There is mild dilatation of the aortic root. Conclusion(s)/Recommendation(s): Suboptimal study. Consider contrast enhancement for future studies. FINDINGS  Left Ventricle: Left ventricular ejection fraction, by estimation, is 60 to 65%. The left ventricle has normal function. Left ventricular endocardial border not optimally defined to evaluate regional wall motion. The left ventricular internal cavity size was normal in size. There is no left ventricular hypertrophy. Left ventricular diastolic parameters are consistent with Grade I diastolic dysfunction (impaired  relaxation). Normal left  ventricular filling pressure. Right Ventricle: The right ventricular size is moderately enlarged. Right vetricular wall thickness was not assessed. Right ventricular systolic function is normal. Left Atrium: Left atrial size was normal in size. Right Atrium: Right atrial size was mild to moderately dilated. Pericardium: There is no evidence of pericardial effusion. Mitral Valve: The mitral valve was not well visualized. Trivial mitral valve regurgitation. Tricuspid Valve: The tricuspid valve is not well visualized. Tricuspid valve regurgitation unable to assess. Aortic Valve: The aortic valve was not well visualized. Aortic valve regurgitation is mild. Pulmonic Valve: The pulmonic valve was not well visualized. Pulmonic valve regurgitation unable to assess. Aorta: Aortic dilatation noted. There is mild dilatation of the aortic root. Venous: The inferior vena cava was not well visualized. IAS/Shunts: The interatrial septum was not well visualized.  LEFT VENTRICLE PLAX 2D LVIDd:         3.88 cm      Diastology LVIDs:         2.68 cm      LV e' lateral:   5.62 cm/s LV PW:         0.96 cm      LV E/e' lateral: 11.1 LV IVS:        1.21 cm      LV e' medial:    7.86 cm/s LVOT diam:     2.00 cm      LV E/e' medial:  8.0 LV SV:         57 LV SV Index:   27 LVOT Area:     3.14 cm  LV Volumes (MOD) LV vol d, MOD A2C: 68.9 ml LV vol d, MOD A4C: 118.0 ml LV vol s, MOD A2C: 37.0 ml LV vol s, MOD A4C: 47.6 ml LV SV MOD A2C:     31.9 ml LV SV MOD A4C:     118.0 ml LV SV MOD BP:      54.6 ml RIGHT VENTRICLE RV S prime:     14.50 cm/s TAPSE (M-mode): 2.8 cm LEFT ATRIUM             Index LA diam:        2.70 cm 1.30 cm/m LA Vol (A2C):   56.7 ml 27.31 ml/m LA Vol (A4C):   40.7 ml 19.60 ml/m LA Biplane Vol:         23.90 ml/m  AORTIC VALVE LVOT Vmax:   103.00 cm/s LVOT Vmean:  64.000 cm/s LVOT VTI:    0.181 m  AORTA Ao Root diam: 3.80 cm MITRAL VALVE               TRICUSPID VALVE MV Area (PHT):             TR Peak grad:   3.0 mmHg  MV Decel Time: 143 msec    TR Vmax:        86.30 cm/s MR Peak grad: 21.8 mmHg MR Vmax:      233.50 cm/s  SHUNTS MV E velocity: 62.60 cm/s  Systemic VTI:  0.18 m MV A velocity: 70.80 cm/s  Systemic Diam: 2.00 cm MV E/A ratio:  0.88 Kate Sable MD Electronically signed by Kate Sable MD Signature Date/Time: 10/09/2019/5:58:14 PM    Final     ASSESSMENT AND PLAN: This is a very pleasant 74 years old white male with stage IIIc/IV (T4, N2, M0/M1 a) non-small cell lung cancer, squamous cell carcinoma presented with large necrotic mass extending to the carina with  associated obstruction of the right lower lobe bronchus and postobstructive collapse/consolidation and necrosis in the right lower lobe as well as mediastinal lymphadenopathy with 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   The patient has PD-L1 expression of 20%. The patient completed a course of palliative radiotherapy to the obstructive right lower lobe lung mass under the care of Dr. Sondra Come.  He is feeling a little bit better with less shortness of breath and chest pain. The patient is currently undergoing systemic chemotherapy with carboplatin and paclitaxel.  Status post 2 cycles.  His treatment with [name] was discontinued after cycle #1 secondary to significant skin rash and he is currently on a tapered dose of prednisone. I recommended for the patient to proceed with cycle #3 today as planned. He will come back for follow-up visit in 3 weeks for evaluation with repeat CT scan of the chest, abdomen pelvis for restaging of his disease. For the chemotherapy-induced anemia, we will continue to monitor him closely and consider the patient for transfusion if his hemoglobin is less than 8. He was advised to call immediately if he has any concerning symptoms in the interval. The patient voices understanding of current disease status and treatment options and is in agreement with the current care plan.  All  questions were answered. The patient knows to call the clinic with any problems, questions or concerns. We can certainly see the patient much sooner if necessary.  Disclaimer: This note was dictated with voice recognition software. Similar sounding words can inadvertently be transcribed and may not be corrected upon review.

## 2019-10-15 ENCOUNTER — Telehealth: Payer: Self-pay | Admitting: Cardiology

## 2019-10-15 NOTE — Telephone Encounter (Signed)
Patient called requesting results of recent echo .

## 2019-10-15 NOTE — Telephone Encounter (Signed)
Echo done on 5/6

## 2019-10-16 ENCOUNTER — Inpatient Hospital Stay: Payer: Medicare Other

## 2019-10-16 ENCOUNTER — Other Ambulatory Visit: Payer: Self-pay

## 2019-10-16 ENCOUNTER — Inpatient Hospital Stay (HOSPITAL_COMMUNITY)
Admission: EM | Admit: 2019-10-16 | Discharge: 2019-10-18 | DRG: 640 | Disposition: A | Discharge status: Home Health | Payer: Medicare Other | Attending: Family Medicine | Admitting: Family Medicine

## 2019-10-16 ENCOUNTER — Observation Stay (HOSPITAL_COMMUNITY): Payer: Medicare Other

## 2019-10-16 ENCOUNTER — Telehealth: Payer: Self-pay | Admitting: *Deleted

## 2019-10-16 ENCOUNTER — Emergency Department (HOSPITAL_COMMUNITY): Payer: Medicare Other

## 2019-10-16 ENCOUNTER — Encounter (HOSPITAL_COMMUNITY): Payer: Self-pay

## 2019-10-16 DIAGNOSIS — R531 Weakness: Secondary | ICD-10-CM | POA: Diagnosis not present

## 2019-10-16 DIAGNOSIS — L270 Generalized skin eruption due to drugs and medicaments taken internally: Secondary | ICD-10-CM | POA: Diagnosis present

## 2019-10-16 DIAGNOSIS — T451X5A Adverse effect of antineoplastic and immunosuppressive drugs, initial encounter: Secondary | ICD-10-CM | POA: Diagnosis present

## 2019-10-16 DIAGNOSIS — Z66 Do not resuscitate: Secondary | ICD-10-CM | POA: Diagnosis present

## 2019-10-16 DIAGNOSIS — Z20822 Contact with and (suspected) exposure to covid-19: Secondary | ICD-10-CM | POA: Diagnosis present

## 2019-10-16 DIAGNOSIS — I480 Paroxysmal atrial fibrillation: Secondary | ICD-10-CM | POA: Diagnosis present

## 2019-10-16 DIAGNOSIS — D630 Anemia in neoplastic disease: Secondary | ICD-10-CM | POA: Diagnosis present

## 2019-10-16 DIAGNOSIS — J910 Malignant pleural effusion: Secondary | ICD-10-CM | POA: Diagnosis present

## 2019-10-16 DIAGNOSIS — E860 Dehydration: Secondary | ICD-10-CM | POA: Diagnosis present

## 2019-10-16 DIAGNOSIS — C3491 Malignant neoplasm of unspecified part of right bronchus or lung: Secondary | ICD-10-CM | POA: Diagnosis present

## 2019-10-16 DIAGNOSIS — Z6823 Body mass index (BMI) 23.0-23.9, adult: Secondary | ICD-10-CM

## 2019-10-16 DIAGNOSIS — E119 Type 2 diabetes mellitus without complications: Secondary | ICD-10-CM | POA: Diagnosis present

## 2019-10-16 DIAGNOSIS — Z811 Family history of alcohol abuse and dependence: Secondary | ICD-10-CM

## 2019-10-16 DIAGNOSIS — I4891 Unspecified atrial fibrillation: Secondary | ICD-10-CM | POA: Diagnosis not present

## 2019-10-16 DIAGNOSIS — Z9981 Dependence on supplemental oxygen: Secondary | ICD-10-CM

## 2019-10-16 DIAGNOSIS — R110 Nausea: Secondary | ICD-10-CM | POA: Diagnosis not present

## 2019-10-16 DIAGNOSIS — J449 Chronic obstructive pulmonary disease, unspecified: Secondary | ICD-10-CM | POA: Diagnosis present

## 2019-10-16 DIAGNOSIS — Z87891 Personal history of nicotine dependence: Secondary | ICD-10-CM

## 2019-10-16 DIAGNOSIS — Z7984 Long term (current) use of oral hypoglycemic drugs: Secondary | ICD-10-CM

## 2019-10-16 DIAGNOSIS — I10 Essential (primary) hypertension: Secondary | ICD-10-CM | POA: Diagnosis not present

## 2019-10-16 DIAGNOSIS — Z7952 Long term (current) use of systemic steroids: Secondary | ICD-10-CM

## 2019-10-16 DIAGNOSIS — D649 Anemia, unspecified: Secondary | ICD-10-CM | POA: Diagnosis not present

## 2019-10-16 DIAGNOSIS — R627 Adult failure to thrive: Principal | ICD-10-CM

## 2019-10-16 DIAGNOSIS — Z833 Family history of diabetes mellitus: Secondary | ICD-10-CM

## 2019-10-16 DIAGNOSIS — Z8249 Family history of ischemic heart disease and other diseases of the circulatory system: Secondary | ICD-10-CM

## 2019-10-16 DIAGNOSIS — C3431 Malignant neoplasm of lower lobe, right bronchus or lung: Secondary | ICD-10-CM | POA: Diagnosis not present

## 2019-10-16 DIAGNOSIS — C3490 Malignant neoplasm of unspecified part of unspecified bronchus or lung: Secondary | ICD-10-CM | POA: Diagnosis not present

## 2019-10-16 DIAGNOSIS — R4182 Altered mental status, unspecified: Secondary | ICD-10-CM | POA: Diagnosis not present

## 2019-10-16 DIAGNOSIS — L4050 Arthropathic psoriasis, unspecified: Secondary | ICD-10-CM | POA: Diagnosis present

## 2019-10-16 DIAGNOSIS — M25551 Pain in right hip: Secondary | ICD-10-CM | POA: Diagnosis present

## 2019-10-16 DIAGNOSIS — Z7951 Long term (current) use of inhaled steroids: Secondary | ICD-10-CM

## 2019-10-16 DIAGNOSIS — J850 Gangrene and necrosis of lung: Secondary | ICD-10-CM | POA: Diagnosis present

## 2019-10-16 DIAGNOSIS — M25559 Pain in unspecified hip: Secondary | ICD-10-CM

## 2019-10-16 DIAGNOSIS — R0689 Other abnormalities of breathing: Secondary | ICD-10-CM | POA: Diagnosis not present

## 2019-10-16 DIAGNOSIS — Z886 Allergy status to analgesic agent status: Secondary | ICD-10-CM

## 2019-10-16 LAB — URINALYSIS, ROUTINE W REFLEX MICROSCOPIC
Bilirubin Urine: NEGATIVE
Glucose, UA: NEGATIVE mg/dL
Hgb urine dipstick: NEGATIVE
Ketones, ur: NEGATIVE mg/dL
Leukocytes,Ua: NEGATIVE
Nitrite: NEGATIVE
Protein, ur: 100 mg/dL — AB
Specific Gravity, Urine: 1.016 (ref 1.005–1.030)
pH: 7 (ref 5.0–8.0)

## 2019-10-16 LAB — CBC WITH DIFFERENTIAL/PLATELET
Abs Immature Granulocytes: 0.08 10*3/uL — ABNORMAL HIGH (ref 0.00–0.07)
Basophils Absolute: 0 10*3/uL (ref 0.0–0.1)
Basophils Relative: 0 %
Eosinophils Absolute: 0 10*3/uL (ref 0.0–0.5)
Eosinophils Relative: 0 %
HCT: 27.1 % — ABNORMAL LOW (ref 39.0–52.0)
Hemoglobin: 8.3 g/dL — ABNORMAL LOW (ref 13.0–17.0)
Immature Granulocytes: 1 %
Lymphocytes Relative: 1 %
Lymphs Abs: 0.1 10*3/uL — ABNORMAL LOW (ref 0.7–4.0)
MCH: 29.5 pg (ref 26.0–34.0)
MCHC: 30.6 g/dL (ref 30.0–36.0)
MCV: 96.4 fL (ref 80.0–100.0)
Monocytes Absolute: 0.1 10*3/uL (ref 0.1–1.0)
Monocytes Relative: 1 %
Neutro Abs: 10 10*3/uL — ABNORMAL HIGH (ref 1.7–7.7)
Neutrophils Relative %: 97 %
Platelets: 168 10*3/uL (ref 150–400)
RBC: 2.81 MIL/uL — ABNORMAL LOW (ref 4.22–5.81)
RDW: 18.9 % — ABNORMAL HIGH (ref 11.5–15.5)
WBC: 10.3 10*3/uL (ref 4.0–10.5)
nRBC: 0 % (ref 0.0–0.2)

## 2019-10-16 LAB — COMPREHENSIVE METABOLIC PANEL
ALT: 15 U/L (ref 0–44)
AST: 12 U/L — ABNORMAL LOW (ref 15–41)
Albumin: 2.4 g/dL — ABNORMAL LOW (ref 3.5–5.0)
Alkaline Phosphatase: 66 U/L (ref 38–126)
Anion gap: 11 (ref 5–15)
BUN: 24 mg/dL — ABNORMAL HIGH (ref 8–23)
CO2: 30 mmol/L (ref 22–32)
Calcium: 8 mg/dL — ABNORMAL LOW (ref 8.9–10.3)
Chloride: 98 mmol/L (ref 98–111)
Creatinine, Ser: 0.71 mg/dL (ref 0.61–1.24)
GFR calc Af Amer: >60 mL/min (ref >60)
GFR calc non Af Amer: >60 mL/min (ref >60)
Glucose, Bld: 183 mg/dL — ABNORMAL HIGH (ref 70–99)
Potassium: 3.8 mmol/L (ref 3.5–5.1)
Sodium: 139 mmol/L (ref 135–145)
Total Bilirubin: 0.6 mg/dL (ref 0.3–1.2)
Total Protein: 6.8 g/dL (ref 6.5–8.1)

## 2019-10-16 LAB — GLUCOSE, CAPILLARY
Glucose-Capillary: 165 mg/dL — ABNORMAL HIGH (ref 70–99)
Glucose-Capillary: 145 mg/dL — ABNORMAL HIGH (ref 70–99)

## 2019-10-16 LAB — SARS CORONAVIRUS 2 BY RT PCR (HOSPITAL ORDER, PERFORMED IN ~~LOC~~ HOSPITAL LAB): SARS Coronavirus 2: NEGATIVE

## 2019-10-16 LAB — CBG MONITORING, ED: Glucose-Capillary: 201 mg/dL — ABNORMAL HIGH (ref 70–99)

## 2019-10-16 MED ORDER — LORATADINE 10 MG PO TABS
10.0000 mg | ORAL_TABLET | Freq: Every day | ORAL | Status: DC
  Administered 2019-10-17 – 2019-10-18 (×2): 10 mg via ORAL
  Filled 2019-10-16 (×2): qty 1

## 2019-10-16 MED ORDER — PROCHLORPERAZINE MALEATE 10 MG PO TABS: 10.0000 mg | ORAL_TABLET | Freq: Four times a day (QID) | ORAL | Status: DC | PRN

## 2019-10-16 MED ORDER — METRONIDAZOLE 0.75 % EX CREA: 1.0000 "application " | TOPICAL_CREAM | Freq: Two times a day (BID) | CUTANEOUS | Status: DC | PRN

## 2019-10-16 MED ORDER — ONDANSETRON HCL 4 MG PO TABS: 4.0000 mg | ORAL_TABLET | Freq: Four times a day (QID) | ORAL | Status: DC | PRN

## 2019-10-16 MED ORDER — ENOXAPARIN SODIUM 40 MG/0.4ML ~~LOC~~ SOLN
40.0000 mg | SUBCUTANEOUS | Status: DC
  Administered 2019-10-17: 40 mg via SUBCUTANEOUS
  Filled 2019-10-16: qty 0.4

## 2019-10-16 MED ORDER — ASCORBIC ACID 500 MG PO TABS
1000.0000 mg | ORAL_TABLET | Freq: Every day | ORAL | Status: DC
  Administered 2019-10-17 – 2019-10-18 (×2): 1000 mg via ORAL
  Filled 2019-10-16 (×2): qty 2

## 2019-10-16 MED ORDER — FLUTICASONE PROPIONATE HFA 110 MCG/ACT IN AERO: 1.0000 | INHALATION_SPRAY | Freq: Two times a day (BID) | RESPIRATORY_TRACT | Status: DC

## 2019-10-16 MED ORDER — PREDNISONE 20 MG PO TABS
20.0000 mg | ORAL_TABLET | Freq: Every day | ORAL | Status: DC
  Administered 2019-10-16 – 2019-10-18 (×3): 20 mg via ORAL
  Filled 2019-10-16 (×3): qty 1

## 2019-10-16 MED ORDER — CHLORHEXIDINE GLUCONATE CLOTH 2 % EX PADS
6.0000 | MEDICATED_PAD | Freq: Every day | CUTANEOUS | Status: DC
  Administered 2019-10-16 – 2019-10-18 (×3): 6 via TOPICAL

## 2019-10-16 MED ORDER — GABAPENTIN 300 MG PO CAPS
300.0000 mg | ORAL_CAPSULE | Freq: Every day | ORAL | Status: DC
  Administered 2019-10-16 – 2019-10-17 (×2): 300 mg via ORAL
  Filled 2019-10-16 (×2): qty 1

## 2019-10-16 MED ORDER — OMEGA-3-ACID ETHYL ESTERS 1 G PO CAPS
1.0000 g | ORAL_CAPSULE | Freq: Every day | ORAL | Status: DC
  Administered 2019-10-17 – 2019-10-18 (×2): 1 g via ORAL
  Filled 2019-10-16 (×2): qty 1

## 2019-10-16 MED ORDER — DILTIAZEM HCL 30 MG PO TABS
30.0000 mg | ORAL_TABLET | Freq: Two times a day (BID) | ORAL | Status: DC
  Administered 2019-10-16 – 2019-10-18 (×4): 30 mg via ORAL
  Filled 2019-10-16 (×4): qty 1

## 2019-10-16 MED ORDER — SODIUM CHLORIDE 0.9 % IV SOLN: INTRAVENOUS | Status: DC

## 2019-10-16 MED ORDER — SODIUM CHLORIDE 0.9% FLUSH
10.0000 mL | INTRAVENOUS | Status: DC | PRN
  Administered 2019-10-17 – 2019-10-18 (×2): 10 mL

## 2019-10-16 MED ORDER — FOLIC ACID 1 MG PO TABS
1.0000 mg | ORAL_TABLET | Freq: Every day | ORAL | Status: DC
  Administered 2019-10-17 – 2019-10-18 (×2): 1 mg via ORAL
  Filled 2019-10-16 (×2): qty 1

## 2019-10-16 MED ORDER — SODIUM CHLORIDE 0.9 % IV BOLUS
1000.0000 mL | Freq: Once | INTRAVENOUS | Status: AC
  Administered 2019-10-16: 1000 mL via INTRAVENOUS

## 2019-10-16 MED ORDER — BUDESONIDE 0.25 MG/2ML IN SUSP
0.2500 mg | Freq: Two times a day (BID) | RESPIRATORY_TRACT | Status: DC
  Administered 2019-10-17 – 2019-10-18 (×3): 0.25 mg via RESPIRATORY_TRACT
  Filled 2019-10-16 (×4): qty 2

## 2019-10-16 MED ORDER — ONDANSETRON HCL 4 MG/2ML IJ SOLN: 4.0000 mg | Freq: Four times a day (QID) | INTRAMUSCULAR | Status: DC | PRN

## 2019-10-16 MED ORDER — ALBUTEROL SULFATE HFA 108 (90 BASE) MCG/ACT IN AERS: 2.0000 | INHALATION_SPRAY | Freq: Four times a day (QID) | RESPIRATORY_TRACT | Status: DC | PRN

## 2019-10-16 MED ORDER — PANTOPRAZOLE SODIUM 40 MG PO TBEC
40.0000 mg | DELAYED_RELEASE_TABLET | Freq: Every day | ORAL | Status: DC
  Administered 2019-10-17 – 2019-10-18 (×2): 40 mg via ORAL
  Filled 2019-10-16 (×2): qty 1

## 2019-10-16 MED ORDER — ALBUTEROL SULFATE (2.5 MG/3ML) 0.083% IN NEBU: 2.5000 mg | INHALATION_SOLUTION | RESPIRATORY_TRACT | Status: DC | PRN

## 2019-10-16 MED ORDER — UMECLIDINIUM-VILANTEROL 62.5-25 MCG/INH IN AEPB
1.0000 | INHALATION_SPRAY | Freq: Every day | RESPIRATORY_TRACT | Status: DC
  Administered 2019-10-17 – 2019-10-18 (×2): 1 via RESPIRATORY_TRACT
  Filled 2019-10-16: qty 14

## 2019-10-16 MED ORDER — ALBUTEROL SULFATE (2.5 MG/3ML) 0.083% IN NEBU: 5.0000 mg | INHALATION_SOLUTION | Freq: Once | RESPIRATORY_TRACT | Status: DC

## 2019-10-16 MED ORDER — INSULIN ASPART 100 UNIT/ML ~~LOC~~ SOLN
0.0000 [IU] | Freq: Three times a day (TID) | SUBCUTANEOUS | Status: DC
  Administered 2019-10-16: 3 [IU] via SUBCUTANEOUS
  Administered 2019-10-16: 2 [IU] via SUBCUTANEOUS
  Administered 2019-10-17 (×2): 5 [IU] via SUBCUTANEOUS
  Administered 2019-10-18: 2 [IU] via SUBCUTANEOUS
  Filled 2019-10-16: qty 0.09

## 2019-10-16 MED ORDER — FERROUS SULFATE 325 (65 FE) MG PO TABS
325.0000 mg | ORAL_TABLET | Freq: Every day | ORAL | Status: DC
  Administered 2019-10-16 – 2019-10-17 (×2): 325 mg via ORAL
  Filled 2019-10-16 (×2): qty 1

## 2019-10-16 MED ORDER — DOCUSATE SODIUM 100 MG PO CAPS
100.0000 mg | ORAL_CAPSULE | Freq: Two times a day (BID) | ORAL | Status: DC
  Administered 2019-10-16 – 2019-10-18 (×4): 100 mg via ORAL
  Filled 2019-10-16 (×4): qty 1

## 2019-10-16 MED ORDER — PREDNISONE 20 MG PO TABS: 20.0000 mg | ORAL_TABLET | Freq: Every day | ORAL | Status: DC

## 2019-10-16 MED ORDER — MOMETASONE FUROATE 0.1 % EX CREA: 1.0000 "application " | TOPICAL_CREAM | Freq: Two times a day (BID) | CUTANEOUS | Status: DC | PRN

## 2019-10-16 MED ORDER — INSULIN ASPART 100 UNIT/ML ~~LOC~~ SOLN
0.0000 [IU] | Freq: Every day | SUBCUTANEOUS | Status: DC
  Filled 2019-10-16: qty 0.05

## 2019-10-16 MED ORDER — TRIAMCINOLONE ACETONIDE 0.1 % EX CREA
1.0000 "application " | TOPICAL_CREAM | Freq: Four times a day (QID) | CUTANEOUS | Status: DC
  Administered 2019-10-16 – 2019-10-18 (×6): 1 via TOPICAL
  Filled 2019-10-16: qty 15
  Filled 2019-10-16: qty 453.6
  Filled 2019-10-16 (×2): qty 15

## 2019-10-16 MED ORDER — TRAMADOL HCL 50 MG PO TABS
50.0000 mg | ORAL_TABLET | Freq: Four times a day (QID) | ORAL | Status: DC | PRN
  Administered 2019-10-16 – 2019-10-18 (×4): 50 mg via ORAL
  Filled 2019-10-16 (×4): qty 1

## 2019-10-16 NOTE — Telephone Encounter (Signed)
See results phone note

## 2019-10-16 NOTE — ED Notes (Signed)
Medications not given due to not being verified. Pharmacy aware.

## 2019-10-16 NOTE — Progress Notes (Addendum)
HEMATOLOGY-ONCOLOGY PROGRESS NOTE  SUBJECTIVE: Jeremy [name] was seen in the emergency room today. Wife is at the bedside.  He presented to the emergency room with generalized weakness.  He reports that he had a good day yesterday and ate very well.  During the middle of the night, his wife heard him calling her and she found him on the floor of the bedroom.  The patient reports that he got up to go to the bathroom but was too weak and fell on the ground.  He denied having any dizziness or lightheadedness.  He is not having any headaches.  Denies chest discomfort but has ongoing shortness of breath which is slightly worse per his wife.  Denies abdominal pain, nausea, vomiting, constipation, diarrhea.  He has an ongoing rash all over his body which has improved with prednisone taper.  Labs on admission were fairly stable -he has chronic anemia.  CT of the head without contrast performed on admission showed no acute intracranial abnormality and no definite evidence of metastatic disease.  This x-ray shows his necrotic right lung mass along with an overlying right pleural effusion which is increased since April. He was given a liter of saline did not feel much better and has therefore been admitted.  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[place])   09/01/2019 -  Chemotherapy   The patient had palonosetron (ALOXI) injection 0.25 mg, 0.25 mg, Intravenous,  Once, 3 of 4 cycles Administration: 0.25 mg (09/01/2019), 0.25 mg (10/14/2019), 0.25 mg (09/23/2019) pegfilgrastim-jmdb (FULPHILA) injection 6 mg, 6 mg, Subcutaneous,  Once, 3 of 4 cycles Administration: 6 mg (09/03/2019), 6 mg (09/25/2019) CARBOplatin (PARAPLATIN) 460 mg in sodium chloride 0.9 % 250 mL chemo infusion, 460 mg (100 % of original dose 461.5 mg), Intravenous,  Once, 3 of 4 cycles Dose modification: 461.5 mg (original dose 461.5 mg, Cycle  1) Administration: 460 mg (09/01/2019), 460 mg (10/14/2019), 460 mg (09/23/2019) fosaprepitant (EMEND) 150 mg in sodium chloride 0.9 % 145 mL IVPB, 150 mg, Intravenous,  Once, 3 of 4 cycles Administration: 150 mg (09/01/2019), 150 mg (10/14/2019), 150 mg (09/23/2019) PACLitaxel (TAXOL) 348 mg in sodium chloride 0.9 % 500 mL chemo infusion (> 62m/m2), 175 mg/m2 = 348 mg (100 % of original dose 175 mg/m2), Intravenous,  Once, 3 of 4 cycles Dose modification: 175 mg/m2 (original dose 175 mg/m2, Cycle 1, Reason: Provider Judgment) Administration: 348 mg (09/01/2019), 348 mg (10/14/2019), 348 mg (09/23/2019) pembrolizumab (KEYTRUDA) 200 mg in sodium chloride 0.9 % 50 mL chemo infusion, 200 mg, Intravenous, Once, 1 of 3 cycles Administration: 200 mg (09/01/2019)  for chemotherapy treatment.       REVIEW OF SYSTEMS:   Constitutional: Denies fevers, chills.  Reports generalized weakness. Eyes: Denies blurriness of vision Ears, nose, mouth, throat, and face: Denies mucositis or sore throat Respiratory: Reports shortness of breath Cardiovascular: Denies palpitation, chest discomfort Gastrointestinal:  Denies nausea, heartburn or change in bowel habits Skin: Denies abnormal skin rashes Lymphatics: Denies new lymphadenopathy or easy bruising Neurological:Denies numbness, tingling or new weaknesses Behavioral/Psych: Mood is stable, no new changes  Extremities: No lower extremity edema All other systems were reviewed with the patient and are negative.  I have reviewed the past medical history, past surgical history, social history and family history with the patient and they are unchanged from previous note.   PHYSICAL EXAMINATION: ECOG PERFORMANCE STATUS: 1 - Symptomatic but  completely ambulatory  Vitals:   10/16/19 0636 10/16/19 0830  BP: 134/63 138/83  Pulse: (!) 107 100  Resp: (!) 26 20  Temp:    SpO2: 100% 100%   Filed Weights   10/16/19 0403  Weight: 81.6 kg    Intake/Output from  previous day: No intake/output data recorded.  GENERAL:alert, no distress and comfortable SKIN: Maculopapular rash secondary to immunotherapy OROPHARYNX:no exudate, no erythema and lips, buccal mucosa, and tongue normal  LUNGS: Diminished right base, rales on the right HEART: regular rate & rhythm and no murmurs and no lower extremity edema ABDOMEN:abdomen soft, non-tender and normal bowel sounds Musculoskeletal:no cyanosis of digits and no clubbing  NEURO: alert & oriented x 3 with fluent speech, no focal motor/sensory deficits  LABORATORY DATA:  I have reviewed the data as listed CMP Latest Ref Rng & Units 10/16/2019 10/14/2019 10/07/2019  Glucose 70 - 99 mg/dL 183(H) 280(H) 129(H)  BUN 8 - 23 mg/dL 24(H) 24(H) 24(H)  Creatinine 0.61 - 1.24 mg/dL 0.71 0.86 0.79  Sodium 135 - 145 mmol/L 139 139 144  Potassium 3.5 - 5.1 mmol/L 3.8 4.0 3.9  Chloride 98 - 111 mmol/L 98 102 103  CO2 22 - 32 mmol/L '30 25 28  ' Calcium 8.9 - 10.3 mg/dL 8.0(L) 8.9 8.7(L)  Total Protein 6.5 - 8.1 g/dL 6.8 7.3 6.9  Total Bilirubin 0.3 - 1.2 mg/dL 0.6 0.3 0.5  Alkaline Phos 38 - 126 U/L 66 87 115  AST 15 - 41 U/L 12(L) 9(L) 9(L)  ALT 0 - 44 U/L '15 17 11    ' Lab Results  Component Value Date   WBC 10.3 10/16/2019   HGB 8.3 (L) 10/16/2019   HCT 27.1 (L) 10/16/2019   MCV 96.4 10/16/2019   PLT 168 10/16/2019   NEUTROABS 10.0 (H) 10/16/2019    DG Chest 2 View  Result Date: 10/16/2019 CLINICAL DATA:  Lung cancer and weakness EXAM: CHEST - 2 VIEW COMPARISON:  08/06/2019 CT.  PET-CT 09/08/2019 FINDINGS: Right lower chest opacification with volume loss from necrotic mass by comparison CT. Small to moderate right lateral pleural effusion has increased from prior. Left chest is clear. Stable heart size. Right port with tip at the SVC. IMPRESSION: Necrotic right lung mass as seen on recent staging scan. An overlying right pleural effusion has increased from April. Electronically Signed   By: Monte Fantasia M.D.   On:  10/16/2019 04:43   CT HEAD WO CONTRAST  Result Date: 10/16/2019 CLINICAL DATA:  Mental status change, unknown cause. Additional provided: Weakness, history of lung cancer EXAM: CT HEAD WITHOUT CONTRAST TECHNIQUE: Contiguous axial images were obtained from the base of the skull through the vertex without intravenous contrast. COMPARISON:  Brain MRI 08/10/2019 FINDINGS: Brain: Please note there is limited assessment for intracranial metastatic disease on this noncontrast CT study. Mild ill-defined hypoattenuation within the cerebral white matter is nonspecific, but consistent with chronic small vessel ischemic disease. Stable, mild generalized parenchymal atrophy. There is no acute intracranial hemorrhage. No demarcated cortical infarct. No extra-axial fluid collection. No evidence of intracranial mass. No midline shift. Vascular: No hyperdense vessel.  Atherosclerotic calcifications Skull: Normal. Negative for fracture or focal lesion. Sinuses/Orbits: Visualized orbits show no acute finding. Paranasal sinus mucosal thickening, most notable within bilateral ethmoid air cells. No significant mastoid effusion. IMPRESSION: 1. Please note there is limited assessment for intracranial metastatic disease on this non-contrast CT study. 2. No evidence of acute intracranial abnormality. 3. Mild generalized parenchymal atrophy and chronic small vessel  ischemic disease. 4. Paranasal sinus mucosal thickening, most notably ethmoid. Electronically Signed   By: Kellie Simmering DO   On: 10/16/2019 09:52   ECHOCARDIOGRAM COMPLETE  Result Date: 10/09/2019    ECHOCARDIOGRAM REPORT   Patient Name:   ADONUS USELMAN Archila Date of Exam: 10/09/2019 Medical Rec #:  [id number]     Height:       74.0 in Accession #:    [id number]    Weight:       179.6 lb Date of Birth:  06/22/1945     BSA:          2.076 m Patient Age:    62 years      BP:           117/71 mmHg Patient Gender: M             HR:           94 bpm. Exam Location:  [facility] Procedure: 2D  Echo, Cardiac Doppler and Color Doppler Indications:     I48.0 PAF  History:         Patient has no prior history of Echocardiogram examinations.                  COPD and Stage IV lung cancer. Anemia, Arrythmias:Atrial                  Fibrillation; Risk Factors:Hypertension, Diabetes and Former                  Smoker.  Sonographer:     Jeneen Montgomery RDMS, RVT, RDCS Referring Phys:  [id number] Alphonse Guild BRANCH Diagnosing Phys: Kate Sable MD  Sonographer Comments: Technically difficult study due to poor echo windows. Image acquisition challenging due to COPD and Image acquisition challenging due to patient body habitus. IMPRESSIONS  1. Left ventricular ejection fraction, by estimation, is 60 to 65%. The left ventricle has normal function. Left ventricular endocardial border not optimally defined to evaluate regional wall motion. Left ventricular diastolic parameters are consistent with Grade I diastolic dysfunction (impaired relaxation).  2. Right ventricular systolic function is normal. The right ventricular size is moderately enlarged.  3. Right atrial size was mild to moderately dilated.  4. The mitral valve was not well visualized. Trivial mitral valve regurgitation.  5. Tricuspid valve regurgitation unable to assess.  6. The aortic valve was not well visualized. Aortic valve regurgitation is mild.  7. Pulmonic valve regurgitation unable to assess.  8. Aortic dilatation noted. There is mild dilatation of the aortic root. Conclusion(s)/Recommendation(s): Suboptimal study. Consider contrast enhancement for future studies. FINDINGS  Left Ventricle: Left ventricular ejection fraction, by estimation, is 60 to 65%. The left ventricle has normal function. Left ventricular endocardial border not optimally defined to evaluate regional wall motion. The left ventricular internal cavity size was normal in size. There is no left ventricular hypertrophy. Left ventricular diastolic parameters are consistent with Grade  I diastolic dysfunction (impaired relaxation). Normal left ventricular filling pressure. Right Ventricle: The right ventricular size is moderately enlarged. Right vetricular wall thickness was not assessed. Right ventricular systolic function is normal. Left Atrium: Left atrial size was normal in size. Right Atrium: Right atrial size was mild to moderately dilated. Pericardium: There is no evidence of pericardial effusion. Mitral Valve: The mitral valve was not well visualized. Trivial mitral valve regurgitation. Tricuspid Valve: The tricuspid valve is not well visualized. Tricuspid valve regurgitation unable to assess. Aortic Valve: The aortic valve was not well visualized.  Aortic valve regurgitation is mild. Pulmonic Valve: The pulmonic valve was not well visualized. Pulmonic valve regurgitation unable to assess. Aorta: Aortic dilatation noted. There is mild dilatation of the aortic root. Venous: The inferior vena cava was not well visualized. IAS/Shunts: The interatrial septum was not well visualized.  LEFT VENTRICLE PLAX 2D LVIDd:         3.88 cm      Diastology LVIDs:         2.68 cm      LV e' lateral:   5.62 cm/s LV PW:         0.96 cm      LV E/e' lateral: 11.1 LV IVS:        1.21 cm      LV e' medial:    7.86 cm/s LVOT diam:     2.00 cm      LV E/e' medial:  8.0 LV SV:         57 LV SV Index:   27 LVOT Area:     3.14 cm  LV Volumes (MOD) LV vol d, MOD A2C: 68.9 ml LV vol d, MOD A4C: 118.0 ml LV vol s, MOD A2C: 37.0 ml LV vol s, MOD A4C: 47.6 ml LV SV MOD A2C:     31.9 ml LV SV MOD A4C:     118.0 ml LV SV MOD BP:      54.6 ml RIGHT VENTRICLE RV S prime:     14.50 cm/s TAPSE (M-mode): 2.8 cm LEFT ATRIUM             Index LA diam:        2.70 cm 1.30 cm/m LA Vol (A2C):   56.7 ml 27.31 ml/m LA Vol (A4C):   40.7 ml 19.60 ml/m LA Biplane Vol:         23.90 ml/m  AORTIC VALVE LVOT Vmax:   103.00 cm/s LVOT Vmean:  64.000 cm/s LVOT VTI:    0.181 m  AORTA Ao Root diam: 3.80 cm MITRAL VALVE                TRICUSPID VALVE MV Area (PHT):             TR Peak grad:   3.0 mmHg MV Decel Time: 143 msec    TR Vmax:        86.30 cm/s MR Peak grad: 21.8 mmHg MR Vmax:      233.50 cm/s  SHUNTS MV E velocity: 62.60 cm/s  Systemic VTI:  0.18 m MV A velocity: 70.80 cm/s  Systemic Diam: 2.00 cm MV E/A ratio:  0.88 Kate Sable MD Electronically signed by Kate Sable MD Signature Date/Time: 10/09/2019/5:58:14 PM    Final     ASSESSMENT AND PLAN: This is a very pleasant 74 year old white male with stage IIIc/IV (T4, N2, M0/M Ia) non-small cell lung cancer, squamous cell carcinoma who presented with a large necrotic mass extending to the carina with associated obstruction of the right lower lobe bronchus and postobstructive collapse/consolidation and necrosis in the right lower lobe as well as mediastinal lymphadenopathy with 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  The patient has PD-L1 expression of 20%.  He completed a course of palliative radiotherapy to the obstructive right lower lobe lung mass under the care of Dr. Sondra Come and had improvement in his shortness of breath and chest pain.  He is now receiving systemic chemotherapy with carboplatin and paclitaxel.  He received Keytruda with cycle #1 only  but it was discontinued secondary to significant skin rash.  Status post 3 cycles of his treatment.  He is now admitted for generalized weakness and failure to thrive.  Labs and imaging studies have been reviewed.  Agree with IV hydration and PT/OT evaluation.  The patient was scheduled to receive Neulasta in our office today.  We cannot administer this medication as an inpatient.  I advised the patient and his wife that we will monitor his CBC very closely and consider starting Granix if his white blood cell count dropped.  The patient has anemia which is overall stable.  Recommend close monitoring of his hemoglobin and transfuse PRBCs for hemoglobin less than 8.  Continue prednisone for  his rash.   LOS: 0 days   Mikey Bussing, DNP, AGPCNP-BC, AOCNP 10/16/19  ADDENDUM: Hematology/Oncology Attending: I had a face-to-face encounter with the patient.  I recommended his care plan.  This is a very pleasant 74 years old white male recently diagnosed with stage IV non-small cell lung cancer, squamous cell carcinoma.  He was initially started on systemic chemotherapy with carboplatin, paclitaxel and Keytruda but [name] was discontinued after cycle #1 because of the significant skin rash.  The patient has been on a tapered dose of prednisone and he has some improvement of the skin rash.  He was treated with cycle #2 and 3 with only carboplatin and paclitaxel.  His last dose of the treatment was early this week.  He was supposed to receive Neulasta injection in the office today.  The patient was admitted to the hospital yesterday with generalized weakness and failure to thrive. I recommend for the patient to continue with the IV hydration in addition to rehabilitation with physical therapy as well as occupational therapy. He felt a little bit better today.  We will continue with the current plan for now. Regarding the Neulasta injection, we will monitor his blood work closely on outpatient basis after discharge and will consider the patient for treatment with Granix if needed. Thank you so much for taking good care of Mr. Strike, I will continue to follow up the patient with you and assist in his management on as-needed basis.  Disclaimer: This note was dictated with voice recognition software. Similar sounding words can inadvertently be transcribed and may be missed upon review. Eilleen Kempf, MD

## 2019-10-16 NOTE — ED Provider Notes (Signed)
[facility] DEPT Provider Note: Georgena Spurling, MD, FACEP  CSN: [id number] MRN: [id number] ARRIVAL: 10/16/19 at [place]: [place]  Weakness   HISTORY OF PRESENT ILLNESS  10/16/19 4:02 AM Jeremy [name] is a 74 y.o. male with COPD and lung cancer.  He underwent his third chemotherapy yesterday.  He previously had received Keytruda but this was discontinued due to generalized maculopapular rash that persist to this time.  He has been a week since receiving chemo.  He is here because he got up to go to the bathroom to urinate just prior to arrival.  He was so weak he fell to the ground (without injury) and ended up urinating on himself because he could not get to the bathroom.  He denies nausea or vomiting.  He is not aware of having a fever although his temperature on arrival was 99.6.  He is requesting something to drink.  He is on oxygen 2 L by nasal cannula as needed.   Past Medical History:  Diagnosis Date  . COPD (chronic obstructive pulmonary disease) (HCC)    QUIT SMOKING 30 YRS AGO  . Diabetes ([place]) 2018  . HTN (hypertension)   . Psoriatic arthritis ([place]) 2015    Past Surgical History:  Procedure Laterality Date  . BACK SURGERY  1990  . BIOPSY  12/30/2018   Procedure: BIOPSY;  Surgeon: Danie Binder, MD;  [facility];  Service: Endoscopy;;  gastric  . CATARACT EXTRACTION Bilateral   . CERVICAL SPINE SURGERY  1995  . COLONOSCOPY WITH PROPOFOL N/A 12/30/2018   Procedure: COLONOSCOPY WITH PROPOFOL;  Surgeon: Danie Binder, MD;  [facility];  Service: Endoscopy;  Laterality: N/A;  12:30pm  . ESOPHAGOGASTRODUODENOSCOPY (EGD) WITH PROPOFOL N/A 12/30/2018   Procedure: ESOPHAGOGASTRODUODENOSCOPY (EGD) WITH PROPOFOL;  Surgeon: Danie Binder, MD;  [facility];  Service: Endoscopy;  Laterality: N/A;  . IR IMAGING GUIDED PORT INSERTION  09/04/2019  . POLYPECTOMY  12/30/2018   Procedure: POLYPECTOMY;  Surgeon: Danie Binder,  MD;  [facility];  Service: Endoscopy;;  colon  . THORACENTESIS Right 08/08/2019   Procedure: Thoracentesis;  Surgeon: [name], MD;  Location: [facility];  Service: Pulmonary;  Laterality: Right;  Marland Kitchen VIDEO BRONCHOSCOPY Right 08/08/2019   Procedure: Video Bronchoscopy with Erbe Cryo Biopsy of Right mainstem;  Surgeon: [name], MD;  Location: [facility] OR;  Service: Pulmonary;  Laterality: Right;    Family History  Problem Relation Age of Onset  . Alcoholism Father   . CAD Brother   . Diabetes Brother   . Colon cancer Neg Hx   . Colon polyps Neg Hx   . Gastric cancer Neg Hx     Social History   Tobacco Use  . Smoking status: Former Smoker    Packs/day: 0.50    Years: 50.00    Pack years: 25.00    Types: Cigarettes    Quit date: 12/26/1991    Years since quitting: 27.8  . Smokeless tobacco: Never Used  Substance Use Topics  . Alcohol use: Yes    Comment: occ beer  . Drug use: Never    Prior to Admission medications   Medication Sig Start Date End Date Taking? Authorizing Provider  acetaminophen (TYLENOL) 500 MG tablet Take 500 mg by mouth every 4 (four) hours as needed (for pain.).   Yes [provider]  albuterol (VENTOLIN HFA) 108 (90 Base) MCG/ACT inhaler Inhale 2  puffs into the lungs every 6 (six) hours as needed for wheezing or shortness of breath.   Yes [provider]  Ascorbic Acid (VITAMIN C) 1000 MG tablet Take 1,000 mg by mouth daily.   Yes [provider]  diltiazem (CARDIZEM) 30 MG tablet Take 1 tablet (30 mg total) by mouth 2 (two) times daily. 09/19/19  Yes BranchAlphonse Guild, MD  docusate sodium (COLACE) 100 MG capsule Take 100 mg by mouth 2 (two) times daily.   Yes [provider]  doxycycline (MONODOX) 50 MG capsule Take 50 mg by mouth 2 (two) times daily as needed (rosacea flare ups.).   Yes [provider]  ferrous sulfate 325 (65 FE) MG tablet Take 325 mg by mouth daily with supper.   Yes [provider]  FLOVENT HFA 110 MCG/ACT inhaler 1 puff 2 (two) times daily. 06/20/19  Yes [provider]  folic acid (FOLVITE) 1 MG tablet Take 1 mg by mouth daily.   Yes [provider]  gabapentin (NEURONTIN) 300 MG capsule Take 600 mg by mouth at bedtime.   Yes [provider]  lidocaine-prilocaine (EMLA) cream Apply to the Port-A-Cath site 30 minutes before treatment 08/26/19  Yes Curt Bears, MD  loratadine (CLARITIN) 10 MG tablet Take 10 mg by mouth daily.   Yes [provider]  metFORMIN (GLUCOPHAGE-XR) 500 MG 24 hr tablet Take 500-1,000 mg by mouth See admin instructions. Take 2 tablets (1000 mg) by mouth in the morning & 1 tablet (500 mg) by mouth at night. 10/15/18  Yes [provider]  metroNIDAZOLE (METROCREAM) 0.75 % cream Apply 1 application topically 2 (two) times daily as needed (facial rosacea).    Yes [provider]  mometasone (ELOCON) 0.1 % cream Apply 1 application topically 2 (two) times daily as needed (psoriasis).    Yes [provider]  Omega-3 Fatty Acids (FISH OIL) 1000 MG CAPS Take 1,000 mg by mouth daily.    Yes [provider]  pantoprazole (PROTONIX) 40 MG tablet 1 po 30 mins prior to first meal 12/30/18  Yes Fields, Sandi L, MD  predniSONE (DELTASONE) 20 MG tablet Please take 4 tablets (80 mg) daily for one week, followed by 3 tablets (60 mg) daily for 1 week, followed by 2 tablets (20 mg) for one week 09/23/19  Yes Heilingoetter, Cassandra L, PA-C  prochlorperazine (COMPAZINE) 10 MG tablet Take 1 tablet (10 mg total) by mouth every 6 (six) hours as needed for nausea or vomiting. 08/26/19  Yes Curt Bears, MD  traMADol (ULTRAM) 50 MG tablet Take 50 mg by mouth every 4 (four) hours as needed. for pain 08/19/18  Yes [provider]  triamcinolone lotion (KENALOG) 0.1 % Apply 1 application topically 4 (four) times daily. 09/15/19  Yes Tanner, Lyndon Code., PA-C  umeclidinium-vilanterol (ANORO  ELLIPTA) 62.5-25 MCG/INH AEPB Inhale 1 puff into the lungs daily. 08/13/19  Yes Mariel Aloe, MD    Allergies Aleve [naproxen sodium]   REVIEW OF SYSTEMS  Negative except as noted here or in the History of Present Illness.   PHYSICAL EXAMINATION  Initial Vital Signs Blood pressure (!) 161/83, pulse (!) 111, temperature 99.6 F (37.6 C), temperature source Oral, resp. rate (!) 22, SpO2 97 %.  Examination General: Well-developed, well-nourished male in no acute distress; appearance consistent with age of record HENT: normocephalic; atraumatic Eyes: pupils equal, round and reactive to light; extraocular muscles intact; bilateral pseudophakia Neck: supple Heart: regular rate and rhythm Lungs: Rhonchi  Chest: Port-A-Cath right upper chest Abdomen: soft; nondistended; nontender; bowel sounds present Extremities: No deformity; full range of motion; pulses normal Neurologic: Awake, alert and oriented; motor function intact in all extremities and symmetric; no facial droop Skin: Warm and dry; generalized maculopapular rash:    Psychiatric: Normal mood and affect   RESULTS  Summary of this visit's results, reviewed and interpreted by myself:   EKG Interpretation  Date/Time:  Thursday Oct 16 2019 03:59:34 EDT Ventricular Rate:  107 PR Interval:    QRS Duration: 93 QT Interval:  346 QTC Calculation: 462 R Axis:   54 Text Interpretation: Sinus tachycardia Low voltage, extremity leads Rate is faster Confirmed by Lathan Gieselman, Jenny Reichmann [id number]) on 10/16/2019 4:01:25 AM      Laboratory Studies: Results for orders placed or performed during the hospital encounter of 10/16/19 (from the past 24 hour(s))  CBC with Differential/Platelet     Status: Abnormal   Collection Time: 10/16/19  4:03 AM  Result Value Ref Range   WBC 10.3 4.0 - 10.5 K/uL   RBC 2.81 (L) 4.22 - 5.81 MIL/uL   Hemoglobin 8.3 (L) 13.0 - 17.0 g/dL   HCT 27.1 (L) 39.0 - 52.0 %   MCV 96.4 80.0 - 100.0 fL   MCH 29.5 26.0 -  34.0 pg   MCHC 30.6 30.0 - 36.0 g/dL   RDW 18.9 (H) 11.5 - 15.5 %   Platelets 168 150 - 400 K/uL   nRBC 0.0 0.0 - 0.2 %   Neutrophils Relative % 97 %   Neutro Abs 10.0 (H) 1.7 - 7.7 K/uL   Lymphocytes Relative 1 %   Lymphs Abs 0.1 (L) 0.7 - 4.0 K/uL   Monocytes Relative 1 %   Monocytes Absolute 0.1 0.1 - 1.0 K/uL   Eosinophils Relative 0 %   Eosinophils Absolute 0.0 0.0 - 0.5 K/uL   Basophils Relative 0 %   Basophils Absolute 0.0 0.0 - 0.1 K/uL   Immature Granulocytes 1 %   Abs Immature Granulocytes 0.08 (H) 0.00 - 0.07 K/uL  Comprehensive metabolic panel     Status: Abnormal   Collection Time: 10/16/19  4:03 AM  Result Value Ref Range   Sodium 139 135 - 145 mmol/L   Potassium 3.8 3.5 - 5.1 mmol/L   Chloride 98 98 - 111 mmol/L   CO2 30 22 - 32 mmol/L   Glucose, Bld 183 (H) 70 - 99 mg/dL   BUN 24 (H) 8 - 23 mg/dL   Creatinine, Ser 0.71 0.61 - 1.24 mg/dL   Calcium 8.0 (L) 8.9 - 10.3 mg/dL   Total Protein 6.8 6.5 - 8.1 g/dL   Albumin 2.4 (L) 3.5 - 5.0 g/dL   AST 12 (L) 15 - 41 U/L   ALT 15 0 - 44 U/L   Alkaline Phosphatase 66 38 - 126 U/L   Total Bilirubin 0.6 0.3 - 1.2 mg/dL   GFR calc non Af Amer >60 >60 mL/min   GFR calc Af Amer >60 >60 mL/min   Anion gap 11 5 - 15  Urinalysis, Routine w reflex microscopic     Status: Abnormal   Collection Time: 10/16/19  5:36 AM  Result Value Ref Range   Color, Urine YELLOW YELLOW   APPearance CLEAR CLEAR   Specific Gravity, Urine 1.016 1.005 - 1.030   pH 7.0 5.0 - 8.0   Glucose, UA NEGATIVE NEGATIVE mg/dL   Hgb urine dipstick NEGATIVE NEGATIVE   Bilirubin Urine NEGATIVE NEGATIVE   Ketones, ur NEGATIVE NEGATIVE  mg/dL   Protein, ur 100 (A) NEGATIVE mg/dL   Nitrite NEGATIVE NEGATIVE   Leukocytes,Ua NEGATIVE NEGATIVE   RBC / HPF 6-10 0 - 5 RBC/hpf   WBC, UA 0-5 0 - 5 WBC/hpf   Bacteria, UA RARE (A) NONE SEEN   Squamous Epithelial / LPF 0-5 0 - 5   Mucus PRESENT    Imaging Studies: DG Chest 2 View  Result Date:  10/16/2019 CLINICAL DATA:  Lung cancer and weakness EXAM: CHEST - 2 VIEW COMPARISON:  08/06/2019 CT.  PET-CT 09/08/2019 FINDINGS: Right lower chest opacification with volume loss from necrotic mass by comparison CT. Small to moderate right lateral pleural effusion has increased from prior. Left chest is clear. Stable heart size. Right port with tip at the SVC. IMPRESSION: Necrotic right lung mass as seen on recent staging scan. An overlying right pleural effusion has increased from April. Electronically Signed   By: Monte Fantasia M.D.   On: 10/16/2019 04:43    ED COURSE and MDM  Nursing notes, initial and subsequent vitals signs, including pulse oximetry, reviewed and interpreted by myself.  Vitals:   10/16/19 0415 10/16/19 0433 10/16/19 0500 10/16/19 0530  BP: (!) 156/93 (!) 150/87 (!) 146/78 (!) 144/66  Pulse: (!) 111 (!) 107 (!) 104 (!) 108  Resp: (!) 35 (!) 32 (!) 21 18  Temp:      TempSrc:      SpO2: 96% 99% 97% 98%  Weight:      Height:       Medications  sodium chloride 0.9 % bolus 1,000 mL (0 mLs Intravenous Stopped 10/16/19 0543)   6:11 AM Patient has no gross new abnormalities on his labs or chest x-ray but he is too weak to ambulate even after IV fluids.  His wife states she is unable to care for him at home.  We will have him admitted to the hospitalist service.   PROCEDURES  Procedures   ED DIAGNOSES     ICD-10-CM   1. Generalized weakness  R53.1   2. Adverse effect of chemotherapy, initial encounter  T45.1X5A   3. Chronic anemia  D64.9        Hervey Wedig, Jenny Reichmann, MD 10/16/19 [phone]

## 2019-10-16 NOTE — Telephone Encounter (Signed)
Pt aware and currently at [facility] ED

## 2019-10-16 NOTE — ED Triage Notes (Signed)
Pt arrived via EMS from home after family called to bring to ED. Lung Cancer pt. Family C/O increased weakness. Nausea/Vomiting pt had a fall today and family put pt in back in bed but pt continues to get weaker. Pt has red bumps on skin that has been there per EMS from a chemo reaction.

## 2019-10-16 NOTE — Evaluation (Signed)
Physical Therapy Evaluation Patient Details Name: Jeremy [name] MRN: [id number] DOB: 11/05/1945 Today's Date: 10/16/2019   History of Present Illness  Patient is 74 y.o. male with stage IIIc/IV (T4, N2, M0/M Ia) non-small cell lung cancer, squamous cell carcinoma. Patient is currently receiving systemic chemotherapy with carboplatin and paclitaxel.  He received Keytruda with cycle #1 only but it was discontinued secondary to significant skin rash.  Status post 3 cycles of his treatment. Patient presented to the ED due to fall at home while attempting to ambulate to bathroom at night. He has had progressive weakness since initiating chemotherapy. Pt is on 2L/min at home PRN.     Clinical Impression  Jeremy [name] is 74 y.o. male admitted with above HPI and diagnosis. Patient is currently limited by functional impairments below (see PT problem list). Patient lives with his wife and is independent for mobility at baseline however wife reports trying to get pt to use SPC due to unsteadiness. Patient will benefit from continued skilled PT interventions to address impairments and progress independence with mobility, recommending OPPT vs. HHPT for balance training and general strengthening. Acute PT will follow and progress as able.     Follow Up Recommendations Outpatient PT;Home health PT(HHPT vs OPPT for balance training)    Equipment Recommendations  Rolling walker with 5" wheels(pt may progress to [facility] with therapy, if not will need RW)    Recommendations for Other Services       Precautions / Restrictions Precautions Precautions: Fall Restrictions Weight Bearing Restrictions: No      Mobility  Bed Mobility Overal bed mobility: Needs Assistance Bed Mobility: Supine to Sit     Supine to sit: HOB elevated;Min assist     General bed mobility comments: cues for sequencing to bring LE's off EOB and use bed rail. assist to raise trunk upright and pt required HOB  elevated.  Transfers Overall transfer level: Needs assistance Equipment used: Rolling walker (2 wheeled) Transfers: Sit to/from Stand Sit to Stand: Min assist;From elevated surface         General transfer comment: pt unable to complete from low surface. Patient required cues for hand placement and assist to steady with rising from elevated surface.   Ambulation/Gait Ambulation/Gait assistance: Min assist;Min guard Gait Distance (Feet): 30 Feet Assistive device: Rolling walker (2 wheeled) Gait Pattern/deviations: Step-through pattern;Decreased stride length Gait velocity: decreased   General Gait Details: pt requried ceus for safe use of RW to keep walker on floor. He is unsteady with gait and min assist/guard required for safety. No overt LOB noted.   Stairs            Wheelchair Mobility    Modified Rankin (Stroke Patients Only)       Balance Overall balance assessment: Needs assistance Sitting-balance support: Feet supported Sitting balance-Leahy Scale: Good     Standing balance support: During functional activity;Bilateral upper extremity supported Standing balance-Leahy Scale: Fair              Pertinent Vitals/Pain Pain Assessment: No/denies pain    Home Living Family/patient expects to be discharged to:: Private residence Living Arrangements: Spouse/significant other Available Help at Discharge: Family Type of Home: House Home Access: Stairs to enter Entrance Stairs-Rails: None;Can reach both(no rails at garage) Technical brewer of Steps: 2 in garage; 4-5 steps at back(pt typically enters throug garage) Home Layout: One [place] - single point;Shower seat - built in;Grab bars - tub/shower Additional Comments: Pt enjoys [employer] as a  hobby.    Prior Function Level of Independence: Independent         Comments: pt's wife performs homemaking tasks however he is independent with ADL's and mobility.     Hand  Dominance   Dominant Hand: Right    Extremity/Trunk Assessment   Upper Extremity Assessment Upper Extremity Assessment: Overall WFL for tasks assessed    Lower Extremity Assessment Lower Extremity Assessment: Overall WFL for tasks assessed    Cervical / Trunk Assessment Cervical / Trunk Assessment: Normal  Communication   Communication: No difficulties  Cognition Arousal/Alertness: Awake/alert Behavior During Therapy: WFL for tasks assessed/performed Overall Cognitive Status: Within Functional Limits for tasks assessed                 General Comments General comments (skin integrity, edema, etc.): Pt on 2L/min at rest and saturating at 97-100%. O2 removed for gait and pt remained at 92-100% during ambulation. HR during session ranged from 95-111 bpm.    Exercises     Assessment/Plan    PT Assessment Patient needs continued PT services  PT Problem List Decreased strength;Decreased activity tolerance;Decreased balance;Decreased mobility;Decreased knowledge of use of DME       PT Treatment Interventions DME instruction;Gait training;Stair training;Functional mobility training;Therapeutic activities;Therapeutic exercise;Balance training;Patient/family education    PT Goals (Current goals can be found in the Care Plan section)  Acute Rehab PT Goals Patient Stated Goal: go home PT Goal Formulation: With patient Potential to Achieve Goals: Good    Frequency Min 3X/week    AM-PAC PT "6 Clicks" Mobility  Outcome Measure Help needed turning from your back to your side while in a flat bed without using bedrails?: None Help needed moving from lying on your back to sitting on the side of a flat bed without using bedrails?: A Little Help needed moving to and from a bed to a chair (including a wheelchair)?: A Little Help needed standing up from a chair using your arms (e.g., wheelchair or bedside chair)?: A Little Help needed to walk in hospital room?: A Little Help needed  climbing 3-5 steps with a railing? : A Little 6 Click Score: 19    End of Session Equipment Utilized During Treatment: Gait belt Activity Tolerance: Patient tolerated treatment well Patient left: with call bell/phone within reach;with family/visitor present;with nursing/sitter in room;in bed Nurse Communication: Mobility status PT Visit Diagnosis: Unsteadiness on feet (R26.81);History of falling (Z91.81);Difficulty in walking, not elsewhere classified (R26.2)    Time: 8768-1157 PT Time Calculation (min) (ACUTE ONLY): 21 min   Charges:   PT Evaluation $PT Eval Moderate Complexity: 1 Mod        Gwynneth Albright PT, DPT Physical Therapist with [facility] [phone]  10/16/2019 6:02 PM

## 2019-10-16 NOTE — Telephone Encounter (Signed)
-----   Message from Arnoldo Lenis, MD sent at 10/16/2019 12:59 PM EDT ----- Echo shows normal heart pumping function   Zandra Abts MD

## 2019-10-16 NOTE — H&P (Signed)
History and Physical    Jeremy [name] DOB: January 27, 1946 DOA: 10/16/2019  PCP: [name], MD   Patient coming from: home  Chief Complaint: Generalized weakness  HPI: Jeremy [name] is a 74 y.o. male with medical history significant for COPD, diabetes, hypertension, history of A. 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 Wife reports he went to bed fine last night and around 2.30 am she heard him calling her name and when she came he was on the floor of bedroom. Patient reports he got up to go to the bathroom to urinate but was too weak and fell on the ground and he kept calling and calling finally his wife came to help. He ended up urinating on himself. Patient otherwise denies any nausea, vomiting, chest pain, shortness of breath, fever, chills, headache, focal weakness, numbness tingling, speech difficulties. Wife at bedside reports he seems slightly disoriented. He had his 3rd cycle of palliative chemo on 10/14/19, previously on Keytruda discontinued due to generalized maculopapular rash which is still persistent but improving.  In the ED, blood pressure stable in the 130s to 150s, heart rate in low 100,tachypneic 18-32, Chest x-ray showed right necrotic lung mass with increase in his pleural effusion, CBC CMP fairly stable with chronic anemia, UA WBC 0-5 negative nitrite and leukocytes e.  Patient was hydrated with normal saline 1 L despite which he did not improve and admission was requested, wife unable to care for him at home.  Review of Systems: All systems were reviewed and were negative  except as mentioned in HPI above. Negative for fever Negative for chest pain Negative for focal weakness  Past Medical History:  Diagnosis Date  . COPD (chronic obstructive pulmonary disease) (HCC)    QUIT SMOKING 30 YRS AGO  . Diabetes ([place]) 2018  . HTN (hypertension)   . Psoriatic arthritis ([place]) 2015    Past Surgical History:  Procedure Laterality Date  . BACK SURGERY  1990  . BIOPSY  12/30/2018   Procedure: BIOPSY;  Surgeon: Danie Binder, MD;  [facility];  Service: Endoscopy;;  gastric  . CATARACT EXTRACTION Bilateral   . CERVICAL SPINE SURGERY  1995  . COLONOSCOPY WITH PROPOFOL N/A 12/30/2018   Procedure: COLONOSCOPY WITH PROPOFOL;  Surgeon: Danie Binder, MD;  [facility];  Service: Endoscopy;  Laterality: N/A;  12:30pm  . ESOPHAGOGASTRODUODENOSCOPY (EGD) WITH PROPOFOL N/A 12/30/2018   Procedure: ESOPHAGOGASTRODUODENOSCOPY (EGD) WITH PROPOFOL;  Surgeon: Danie Binder, MD;  [facility];  Service: Endoscopy;  Laterality: N/A;  . IR IMAGING GUIDED PORT INSERTION  09/04/2019  . POLYPECTOMY  12/30/2018   Procedure: POLYPECTOMY;  Surgeon: Danie Binder, MD;  [facility];  Service: Endoscopy;;  colon  . THORACENTESIS Right 08/08/2019   Procedure: Thoracentesis;  Surgeon: [name], MD;  Location: [place];  Service: Pulmonary;  Laterality: Right;  Marland Kitchen VIDEO BRONCHOSCOPY Right 08/08/2019   Procedure: Video Bronchoscopy with Erbe Cryo Biopsy of Right mainstem;  Surgeon: [name], MD;  Location: [facility] OR;  Service: Pulmonary;  Laterality: Right;     reports that he quit smoking about 27 years  ago. His smoking use included cigarettes. He has a 25.00 pack-year smoking history. He has never used smokeless tobacco. He reports current alcohol use. He reports that he does not use drugs.  Allergies  Allergen Reactions  . Aleve [Naproxen Sodium] Hives and Rash    Family History  Problem Relation Age of Onset  . Alcoholism Father   .  CAD Brother   . Diabetes Brother   . Colon cancer Neg Hx   . Colon polyps Neg Hx   . Gastric cancer Neg Hx    Prior to Admission medications   Medication Sig Start Date End Date Taking? Authorizing Provider  acetaminophen (TYLENOL) 500 MG tablet Take 500 mg by mouth every 4 (four) hours as needed (for pain.).   Yes [provider]  albuterol (VENTOLIN HFA) 108 (90 Base) MCG/ACT inhaler Inhale 2 puffs into the lungs every 6 (six) hours as needed for wheezing or shortness of breath.   Yes [provider]  Ascorbic Acid (VITAMIN C) 1000 MG tablet Take 1,000 mg by mouth daily.   Yes [provider]  diltiazem (CARDIZEM) 30 MG tablet Take 1 tablet (30 mg total) by mouth 2 (two) times daily. 09/19/19  Yes BranchAlphonse Guild, MD  docusate sodium (COLACE) 100 MG capsule Take 100 mg by mouth 2 (two) times daily.   Yes [provider]  doxycycline (MONODOX) 50 MG capsule Take 50 mg by mouth 2 (two) times daily as needed (rosacea flare ups.).   Yes [provider]  ferrous sulfate 325 (65 FE) MG tablet Take 325 mg by mouth daily with supper.   Yes [provider]  FLOVENT HFA 110 MCG/ACT inhaler 1 puff 2 (two) times daily. 06/20/19  Yes [provider]  folic acid (FOLVITE) 1 MG tablet Take 1 mg by mouth daily.   Yes [provider]  gabapentin (NEURONTIN) 300 MG capsule Take 600 mg by mouth at bedtime.   Yes [provider]  lidocaine-prilocaine (EMLA) cream Apply to the Port-A-Cath site 30 minutes before treatment 08/26/19  Yes Curt Bears, MD  loratadine (CLARITIN) 10 MG tablet Take 10 mg by mouth daily.   Yes [provider]  metFORMIN (GLUCOPHAGE-XR) 500 MG 24 hr tablet Take 500-1,000 mg by mouth See admin instructions. Take 2 tablets (1000 mg) by mouth in the morning & 1 tablet (500 mg) by mouth at night. 10/15/18  Yes [provider]  metroNIDAZOLE (METROCREAM) 0.75 % cream Apply 1 application  topically 2 (two) times daily as needed (facial rosacea).    Yes [provider]  mometasone (ELOCON) 0.1 % cream Apply 1 application topically 2 (two) times daily as needed (psoriasis).    Yes [provider]  Omega-3 Fatty Acids (FISH OIL) 1000 MG CAPS Take 1,000 mg by mouth daily.    Yes [provider]  pantoprazole (PROTONIX) 40 MG tablet 1 po 30 mins prior to first meal 12/30/18  Yes Fields, Sandi L, MD  predniSONE (DELTASONE) 20 MG tablet Please take 4 tablets (80 mg) daily for one week, followed by 3 tablets (60 mg) daily for 1 week, followed by 2 tablets (20 mg) for one week 09/23/19  Yes Heilingoetter, Cassandra L, PA-C  prochlorperazine (COMPAZINE) 10 MG tablet Take 1 tablet (10 mg total) by mouth every 6 (six) hours as needed for nausea or vomiting. 08/26/19  Yes Curt Bears, MD  traMADol (ULTRAM) 50 MG tablet Take 50 mg by mouth every 4 (four) hours as needed. for  pain 08/19/18  Yes [provider]  triamcinolone lotion (KENALOG) 0.1 % Apply 1 application topically 4 (four) times daily. 09/15/19  Yes Tanner, Lyndon Code., PA-C  umeclidinium-vilanterol (ANORO ELLIPTA) 62.5-25 MCG/INH AEPB Inhale 1 puff into the lungs daily. 08/13/19  Yes Mariel Aloe, MD    Physical Exam: Vitals:   10/16/19 0433 10/16/19 0500 10/16/19 0530 10/16/19 0636  BP: (!) 150/87 (!) 146/78 (!) 144/66 134/63  Pulse: (!) 107 (!) 104 (!) 108 (!) 107  Resp: (!) 32 (!) 21 18 (!) 26  Temp:      TempSrc:      SpO2: 99% 97% 98% 100%  Weight:      Height:       General exam: AAOx3, ill frail, on RA HEENT:Oral mucosa moist, Ear/Nose WNL grossly, dentition normal. Respiratory system: bilaterally diminished, crackles on rt side, tachypneic Cardiovascular system: S1 & S2 +, No JVD,. Gastrointestinal system: Abdomen soft, NT,ND, BS+ Nervous System:Alert, awake, moving extremities and grossly nonfocal Extremities: No edema, distal peripheral pulses palpable.  Skin:  Skin rashes +,no  icterus. MSK: Normal muscle bulk,tone, power     Labs on Admission: I have personally reviewed following labs and imaging studies  CBC: Recent Labs  Lab 10/14/19 0928 10/16/19 0403  WBC 13.6* 10.3  NEUTROABS 12.9* 10.0*  HGB 8.5* 8.3*  HCT 27.6* 27.1*  MCV 95.8 96.4  PLT 197 361   Basic Metabolic Panel: Recent Labs  Lab 10/14/19 0928 10/16/19 0403  NA 139 139  K 4.0 3.8  CL 102 98  CO2 25 30  GLUCOSE 280* 183*  BUN 24* 24*  CREATININE 0.86 0.71  CALCIUM 8.9 8.0*   GFR: Estimated Creatinine Clearance: 92.1 mL/min (by C-G formula based on SCr of 0.71 mg/dL). Liver Function Tests: Recent Labs  Lab 10/14/19 0928 10/16/19 0403  AST 9* 12*  ALT 17 15  ALKPHOS 87 66  BILITOT 0.3 0.6  PROT 7.3 6.8  ALBUMIN 2.1* 2.4*   No results for input(s): LIPASE, AMYLASE in the last 168 hours. No results for input(s): AMMONIA in the last 168 hours. Coagulation Profile: No results for input(s): INR, PROTIME in the last 168 hours. Cardiac Enzymes: No results for input(s): CKTOTAL, CKMB, CKMBINDEX, TROPONINI in the last 168 hours. BNP (last 3 results) No results for input(s): PROBNP in the last 8760 hours. HbA1C: No results for input(s): HGBA1C in the last 72 hours. CBG: No results for input(s): GLUCAP in the last 168 hours. Lipid Profile: No results for input(s): CHOL, HDL, LDLCALC, TRIG, CHOLHDL, LDLDIRECT in the last 72 hours. Thyroid Function Tests: Recent Labs    10/14/19 0927  TSH 0.725   Anemia Panel: No results for input(s): VITAMINB12, FOLATE, FERRITIN, TIBC, IRON, RETICCTPCT in the last 72 hours. Urine analysis:    Component Value Date/Time   COLORURINE YELLOW 10/16/2019 0536   APPEARANCEUR CLEAR 10/16/2019 0536   LABSPEC 1.016 10/16/2019 0536   PHURINE 7.0 10/16/2019 0536   GLUCOSEU NEGATIVE 10/16/2019 0536   HGBUR NEGATIVE 10/16/2019 0536   BILIRUBINUR NEGATIVE 10/16/2019 0536   KETONESUR NEGATIVE 10/16/2019 0536   PROTEINUR 100 (A) 10/16/2019 0536    NITRITE NEGATIVE 10/16/2019 0536   LEUKOCYTESUR NEGATIVE 10/16/2019 0536    Radiological Exams on Admission: DG Chest 2 View  Result Date: 10/16/2019 CLINICAL DATA:  Lung cancer and weakness EXAM: CHEST - 2 VIEW COMPARISON:  08/06/2019 CT.  PET-CT 09/08/2019 FINDINGS: Right lower chest opacification with volume loss from necrotic mass by comparison CT. Small to  moderate right lateral pleural effusion has increased from prior. Left chest is clear. Stable heart size. Right port with tip at the SVC. IMPRESSION: Necrotic right lung mass as seen on recent staging scan. An overlying right pleural effusion has increased from April. Electronically Signed   By: Monte Fantasia M.D.   On: 10/16/2019 04:43     Assessment/Plan Generalized Weakness/Failure to thrive in adult in the setting of stage IIIc/IV lung cancer currently on palliative chemo: Suspect multifactorial, no obvious infection noted in UA-but does have right lung necrotic mass.  We will continue on IV fluid hydration obtain PT OT, nutrition eval B1 B12.  Tsh 2 days ago was normal at 0.7. Cont his prednisone taper at 20mg  now.  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  Will notify his oncology team this morning- he was supposed to get 'booster" per wife- likelky? NEULASTA 2 days after chemo. Notified Oncology PA/ Dr Julien Nordmann team.  Mild disorientation- ?cause, non focal, Check CT head head In ED.  Rash associated with chemotherapy: improving slowly per wife, cont skin ointment steroid cream Flagyl and also his prednisone taper,currently at 20 mg day # 2/7  then finish.  COPD: Continue home ANORO ELLIPTA, bronchodilators. He isn 2l prn home o2. Currently on RA.  Diabetes mellitus type II stable A1c at 7.1 08/07/2019.  hold Metformin.  Add SSI  Hypertension: Blood pressure fairly stable. Taken off lisinopril by pcp in jan or feb 2/2 hypotension.  PAF: Currently in sinus rhythm. Cont cardizem, wife says his cardiologist Dr [name] was unable to anticoagulate due to his anemia.   Poor nutrition status from cancer- albumin Is low. Nutrition eval.  Body mass index is 23.11 kg/m.   Severity of Illness: The appropriate patient status for this patient is OBSERVATION. Observation status is judged to be reasonable and necessary in order to provide the required intensity of service to ensure the patient's safety. The patient's presenting symptoms, physical exam findings, and initial radiographic and laboratory data in the context of their medical condition is felt to place them at decreased risk for further clinical deterioration. Furthermore, it is anticipated that the patient will be medically stable for discharge from the hospital within 2 midnights of admission.  DVT prophylaxis: Lovenox Code Status:DNR Family Communication: Admission, patients condition and plan of care including tests being ordered have been discussed with the patient and hie wife who indicate understanding and agree with the plan and Code Status.  Consults called:  Oncology  Antonieta Pert MD [facility]  If 7PM-7AM, please contact night-coverage [email or web address]  10/16/2019, 8:37 AM

## 2019-10-17 DIAGNOSIS — Z7952 Long term (current) use of systemic steroids: Secondary | ICD-10-CM | POA: Diagnosis not present

## 2019-10-17 DIAGNOSIS — Z8249 Family history of ischemic heart disease and other diseases of the circulatory system: Secondary | ICD-10-CM | POA: Diagnosis not present

## 2019-10-17 DIAGNOSIS — Z7984 Long term (current) use of oral hypoglycemic drugs: Secondary | ICD-10-CM | POA: Diagnosis not present

## 2019-10-17 DIAGNOSIS — Z811 Family history of alcohol abuse and dependence: Secondary | ICD-10-CM | POA: Diagnosis not present

## 2019-10-17 DIAGNOSIS — T451X5A Adverse effect of antineoplastic and immunosuppressive drugs, initial encounter: Secondary | ICD-10-CM | POA: Diagnosis present

## 2019-10-17 DIAGNOSIS — R627 Adult failure to thrive: Secondary | ICD-10-CM | POA: Diagnosis present

## 2019-10-17 DIAGNOSIS — Z833 Family history of diabetes mellitus: Secondary | ICD-10-CM | POA: Diagnosis not present

## 2019-10-17 DIAGNOSIS — J910 Malignant pleural effusion: Secondary | ICD-10-CM | POA: Diagnosis present

## 2019-10-17 DIAGNOSIS — D649 Anemia, unspecified: Secondary | ICD-10-CM | POA: Diagnosis not present

## 2019-10-17 DIAGNOSIS — R531 Weakness: Secondary | ICD-10-CM | POA: Diagnosis not present

## 2019-10-17 DIAGNOSIS — Z886 Allergy status to analgesic agent status: Secondary | ICD-10-CM | POA: Diagnosis not present

## 2019-10-17 DIAGNOSIS — Z7951 Long term (current) use of inhaled steroids: Secondary | ICD-10-CM | POA: Diagnosis not present

## 2019-10-17 DIAGNOSIS — Z6823 Body mass index (BMI) 23.0-23.9, adult: Secondary | ICD-10-CM | POA: Diagnosis not present

## 2019-10-17 DIAGNOSIS — C3491 Malignant neoplasm of unspecified part of right bronchus or lung: Secondary | ICD-10-CM | POA: Diagnosis not present

## 2019-10-17 DIAGNOSIS — J850 Gangrene and necrosis of lung: Secondary | ICD-10-CM | POA: Diagnosis present

## 2019-10-17 DIAGNOSIS — Z87891 Personal history of nicotine dependence: Secondary | ICD-10-CM | POA: Diagnosis not present

## 2019-10-17 DIAGNOSIS — E860 Dehydration: Secondary | ICD-10-CM | POA: Diagnosis present

## 2019-10-17 DIAGNOSIS — E119 Type 2 diabetes mellitus without complications: Secondary | ICD-10-CM | POA: Diagnosis present

## 2019-10-17 DIAGNOSIS — I480 Paroxysmal atrial fibrillation: Secondary | ICD-10-CM | POA: Diagnosis present

## 2019-10-17 DIAGNOSIS — S79911A Unspecified injury of right hip, initial encounter: Secondary | ICD-10-CM | POA: Diagnosis not present

## 2019-10-17 DIAGNOSIS — Z66 Do not resuscitate: Secondary | ICD-10-CM | POA: Diagnosis present

## 2019-10-17 DIAGNOSIS — C3431 Malignant neoplasm of lower lobe, right bronchus or lung: Secondary | ICD-10-CM | POA: Diagnosis present

## 2019-10-17 DIAGNOSIS — J449 Chronic obstructive pulmonary disease, unspecified: Secondary | ICD-10-CM | POA: Diagnosis present

## 2019-10-17 DIAGNOSIS — L4050 Arthropathic psoriasis, unspecified: Secondary | ICD-10-CM | POA: Diagnosis present

## 2019-10-17 DIAGNOSIS — Z20822 Contact with and (suspected) exposure to covid-19: Secondary | ICD-10-CM | POA: Diagnosis present

## 2019-10-17 DIAGNOSIS — I10 Essential (primary) hypertension: Secondary | ICD-10-CM | POA: Diagnosis present

## 2019-10-17 DIAGNOSIS — D630 Anemia in neoplastic disease: Secondary | ICD-10-CM | POA: Diagnosis present

## 2019-10-17 DIAGNOSIS — Z9981 Dependence on supplemental oxygen: Secondary | ICD-10-CM | POA: Diagnosis not present

## 2019-10-17 DIAGNOSIS — J431 Panlobular emphysema: Secondary | ICD-10-CM | POA: Diagnosis not present

## 2019-10-17 LAB — CBC
HCT: 26.5 % — ABNORMAL LOW (ref 39.0–52.0)
Hemoglobin: 8.1 g/dL — ABNORMAL LOW (ref 13.0–17.0)
MCH: 29.1 pg (ref 26.0–34.0)
MCHC: 30.6 g/dL (ref 30.0–36.0)
MCV: 95.3 fL (ref 80.0–100.0)
Platelets: 121 10*3/uL — ABNORMAL LOW (ref 150–400)
RBC: 2.78 MIL/uL — ABNORMAL LOW (ref 4.22–5.81)
RDW: 18 % — ABNORMAL HIGH (ref 11.5–15.5)
WBC: 5.5 10*3/uL (ref 4.0–10.5)
nRBC: 0 % (ref 0.0–0.2)

## 2019-10-17 LAB — COMPREHENSIVE METABOLIC PANEL
ALT: 18 U/L (ref 0–44)
AST: 19 U/L (ref 15–41)
Albumin: 2.2 g/dL — ABNORMAL LOW (ref 3.5–5.0)
Alkaline Phosphatase: 60 U/L (ref 38–126)
Anion gap: 9 (ref 5–15)
BUN: 16 mg/dL (ref 8–23)
CO2: 28 mmol/L (ref 22–32)
Calcium: 7.6 mg/dL — ABNORMAL LOW (ref 8.9–10.3)
Chloride: 94 mmol/L — ABNORMAL LOW (ref 98–111)
Creatinine, Ser: 0.59 mg/dL — ABNORMAL LOW (ref 0.61–1.24)
GFR calc Af Amer: >60 mL/min (ref >60)
GFR calc non Af Amer: >60 mL/min (ref >60)
Glucose, Bld: 114 mg/dL — ABNORMAL HIGH (ref 70–99)
Potassium: 3.4 mmol/L — ABNORMAL LOW (ref 3.5–5.1)
Sodium: 131 mmol/L — ABNORMAL LOW (ref 135–145)
Total Bilirubin: 0.6 mg/dL (ref 0.3–1.2)
Total Protein: 6.4 g/dL — ABNORMAL LOW (ref 6.5–8.1)

## 2019-10-17 LAB — URINE CULTURE: Culture: NO GROWTH

## 2019-10-17 LAB — MAGNESIUM: Magnesium: 1.6 mg/dL — ABNORMAL LOW (ref 1.7–2.4)

## 2019-10-17 LAB — GLUCOSE, CAPILLARY
Glucose-Capillary: 110 mg/dL — ABNORMAL HIGH (ref 70–99)
Glucose-Capillary: 275 mg/dL — ABNORMAL HIGH (ref 70–99)
Glucose-Capillary: 275 mg/dL — ABNORMAL HIGH (ref 70–99)
Glucose-Capillary: 214 mg/dL — ABNORMAL HIGH (ref 70–99)

## 2019-10-17 LAB — VITAMIN B12: Vitamin B-12: 1041 pg/mL — ABNORMAL HIGH (ref 180–914)

## 2019-10-17 MED ORDER — ENSURE ENLIVE PO LIQD
237.0000 mL | Freq: Three times a day (TID) | ORAL | Status: DC
  Administered 2019-10-17 – 2019-10-18 (×2): 237 mL via ORAL

## 2019-10-17 MED ORDER — MAGNESIUM SULFATE 2 GM/50ML IV SOLN
2.0000 g | Freq: Once | INTRAVENOUS | Status: AC
  Administered 2019-10-17: 2 g via INTRAVENOUS
  Filled 2019-10-17: qty 50

## 2019-10-17 MED ORDER — GUAIFENESIN ER 600 MG PO TB12
600.0000 mg | ORAL_TABLET | Freq: Two times a day (BID) | ORAL | Status: DC
  Administered 2019-10-17 – 2019-10-18 (×3): 600 mg via ORAL
  Filled 2019-10-17 (×3): qty 1

## 2019-10-17 MED ORDER — ADULT MULTIVITAMIN W/MINERALS CH
1.0000 | ORAL_TABLET | Freq: Every day | ORAL | Status: DC
  Administered 2019-10-17 – 2019-10-18 (×2): 1 via ORAL
  Filled 2019-10-17 (×2): qty 1

## 2019-10-17 MED ORDER — POTASSIUM CHLORIDE CRYS ER 20 MEQ PO TBCR
40.0000 meq | EXTENDED_RELEASE_TABLET | Freq: Once | ORAL | Status: AC
  Administered 2019-10-17: 40 meq via ORAL
  Filled 2019-10-17: qty 2

## 2019-10-17 NOTE — Progress Notes (Signed)
Physical Therapy Treatment Patient Details Name: Jeremy [name] MRN: [id number] DOB: 10-Oct-1945 Today's Date: 10/17/2019    History of Present Illness Patient is 74 y.o. male with stage IIIc/IV (T4, N2, M0/M Ia) non-small cell lung cancer, squamous cell carcinoma. Patient is currently receiving systemic chemotherapy with carboplatin and paclitaxel.  He received Keytruda with cycle #1 only but it was discontinued secondary to significant skin rash.  Status post 3 cycles of his treatment. Patient presented to the ED due to fall at home while attempting to ambulate to bathroom at night. He has had progressive weakness since initiating chemotherapy. Pt is on 2L/min at home PRN.     PT Comments    Patient limited today by tachycardia during mobility. He has improved sequencing and able to complete bed mobility without assist. Pt required min-mod assist for sit to stand from low surface and cues for reaching to IV pole to steady once risen. Patient HR increased from 100-110's to 137 bpm with standing. Patient denied any chest pain, SOB, dizziness, or lightheadedness. RN notified and pt returned to bed at [place]. Acute PT will continue to progress pt as able to address balance and mobility impairments.    Follow Up Recommendations  Outpatient PT;Home health PT(HHPT vs OPPT for balance training)     Equipment Recommendations  Rolling walker with 5" wheels(pt may progress to [facility] with therapy, if not will need RW)    Recommendations for Other Services       Precautions / Restrictions Precautions Precautions: Fall Restrictions Weight Bearing Restrictions: No    Mobility  Bed Mobility Overal bed mobility: Needs Assistance Bed Mobility: Supine to Sit     Supine to sit: HOB elevated;Min guard     General bed mobility comments: minimal cues needed to use bed rail and bring LE's off EOB. pt able to complete press up and raise trunk without assist. min gaurd for safety.  Transfers Overall  transfer level: Needs assistance Equipment used: None Transfers: Sit to/from Stand Sit to Stand: Min assist;Mod assist         General transfer comment: pt required cues for forward trunk lean and bil UE use for power up to initiate stand from EOB. Pt unsteady and reaching for footboard on bed after rising. Instructed pt to use IV pole in front of him and posture improved. Pt's Hr noted to increased from 100's-110's in supine to 130's with max of 137 in standing. deferred further mobility due to tachycardia.   Ambulation/Gait             General Gait Details: deferred due to tachycardia   Stairs             Wheelchair Mobility    Modified Rankin (Stroke Patients Only)       Balance Overall balance assessment: Needs assistance Sitting-balance support: Feet supported Sitting balance-Leahy Scale: Good     Standing balance support: During functional activity;Bilateral upper extremity supported Standing balance-Leahy Scale: Fair         Cognition Arousal/Alertness: Awake/alert Behavior During Therapy: WFL for tasks assessed/performed Overall Cognitive Status: Within Functional Limits for tasks assessed           Exercises      General Comments General comments (skin integrity, edema, etc.): Pt on 2L/min during session.       Pertinent Vitals/Pain Pain Assessment: No/denies pain           PT Goals (current goals can now be found in the care plan section)  Acute Rehab PT Goals Patient Stated Goal: go home PT Goal Formulation: With patient Potential to Achieve Goals: Good Progress towards PT goals: Progressing toward goals    Frequency    Min 3X/week      PT Plan Current plan remains appropriate       AM-PAC PT "6 Clicks" Mobility   Outcome Measure  Help needed turning from your back to your side while in a flat bed without using bedrails?: None Help needed moving from lying on your back to sitting on the side of a flat bed without  using bedrails?: A Little Help needed moving to and from a bed to a chair (including a wheelchair)?: A Little Help needed standing up from a chair using your arms (e.g., wheelchair or bedside chair)?: A Little Help needed to walk in hospital room?: A Little Help needed climbing 3-5 steps with a railing? : A Little 6 Click Score: 19    End of Session Equipment Utilized During Treatment: Gait belt Activity Tolerance: Patient tolerated treatment well Patient left: with call bell/phone within reach;with family/visitor present;in bed Nurse Communication: Mobility status PT Visit Diagnosis: Unsteadiness on feet (R26.81);History of falling (Z91.81);Difficulty in walking, not elsewhere classified (R26.2)     Time: 8088-1103 PT Time Calculation (min) (ACUTE ONLY): 18 min  Charges:  $Therapeutic Activity: 8-22 mins                     Verner Mould, DPT Physical Therapist with [facility] [phone]  10/17/2019 1:21 PM

## 2019-10-17 NOTE — TOC Initial Note (Addendum)
Transition of Care (TOC) - Initial/Assessment Note    Patient Details  Name: Jeremy [name] MRN: [id number] Date of Birth: 11/04/1945  Transition of Care (TOC) CM/SW Contact:     B , LCSW Phone Number: 10/17/2019, 1:55 PM  Clinical Narrative:   Met with patient in follow up to PT recommendation of HH PT/OP PT. Jeremy [name] lives at home in [place] with his wife.  No family close by.  Patient is open to services, prefers HH.  Has cane at home, no other DME.  Is not open to walker or 3 in 1 in this moment as he believes he will not need them, but is willing to talk about it again on day of d/c.  No preference of HH agencies. TOC will continue to follow during the course of hospitalization.  Addendum:  Patient accepted by Jeremy [name] of [facility] for HH PT/OT services.                 Expected Discharge Plan: Home w Home Health Services Barriers to Discharge: No Barriers Identified   Patient Goals and CMS Choice Patient states their goals for this hospitalization and ongoing recovery are:: "I would like to have someone come to the home."   Choice offered to / list presented to : Patient, Spouse  Expected Discharge Plan and Services Expected Discharge Plan: Home w Home Health Services   Discharge Planning Services: CM Consult Post Acute Care Choice: Home Health Living arrangements for the past 2 months: Single Family Home                                      Prior Living Arrangements/Services Living arrangements for the past 2 months: Single Family Home Lives with:: Spouse Patient language and need for interpreter reviewed:: Yes Do you feel safe going back to the place where you live?: Yes      Need for Family Participation in Patient Care: Yes (Comment) Care giver support system in place?: Yes (comment) Current home services: DME(cane) Criminal Activity/Legal Involvement Pertinent to Current Situation/Hospitalization: No - Comment as needed  Activities of Daily  Living Home Assistive Devices/Equipment: Other (Comment), Walker (specify type), CBG Meter(front wheeled walker, walk-in shower) ADL Screening (condition at time of admission) Patient's cognitive ability adequate to safely complete daily activities?: Yes Is the patient deaf or have difficulty hearing?: No Does the patient have difficulty seeing, even when wearing glasses/contacts?: No Does the patient have difficulty concentrating, remembering, or making decisions?: No Patient able to express need for assistance with ADLs?: Yes Does the patient have difficulty dressing or bathing?: Yes Independently performs ADLs?: No Communication: Independent Dressing (OT): Needs assistance Is this a change from baseline?: Change from baseline, expected to last >3 days Grooming: Needs assistance Is this a change from baseline?: Change from baseline, expected to last >3 days Feeding: Needs assistance Is this a change from baseline?: Change from baseline, expected to last >3 days Bathing: Needs assistance Is this a change from baseline?: Change from baseline, expected to last >3 days Toileting: Needs assistance Is this a change from baseline?: Change from baseline, expected to last >3days In/Out Bed: Needs assistance Is this a change from baseline?: Change from baseline, expected to last >3 days Walks in Home: Needs assistance Is this a change from baseline?: Change from baseline, expected to last >3 days Does the patient have difficulty walking or climbing stairs?: Yes(secondary to increased weakness)   Weakness of Legs: Both Weakness of Arms/Hands: Both  Permission Sought/Granted                  Emotional Assessment Appearance:: Appears stated age Attitude/Demeanor/Rapport: Engaged Affect (typically observed): Appropriate Orientation: : Oriented to Self, Oriented to Place, Oriented to  Time, Oriented to Situation Alcohol / Substance Use: Not Applicable Psych Involvement: No  (comment)  Admission diagnosis:  Chronic anemia [D64.9] Generalized weakness [R53.1] Adverse effect of chemotherapy, initial encounter [T45.1X5A] Patient Active Problem List   Diagnosis Date Noted  . Failure to thrive in adult 10/16/2019  . Generalized weakness 10/16/2019  . Drug-induced skin rash 09/23/2019  . 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HCC) 08/26/2019  . Encounter for antineoplastic chemotherapy 08/26/2019  . Encounter for antineoplastic immunotherapy 08/26/2019  . Goals of care, counseling/discussion 08/26/2019  . Atrial fibrillation with RVR (HCC)   . Protein-calorie malnutrition, severe 08/07/2019  . Hemoptysis 08/06/2019  . Mass of right lung 08/06/2019  . Former smoker 08/06/2019  . Lung mass 08/06/2019  . Postobstructive pneumonia 08/06/2019  . COPD (chronic obstructive pulmonary disease) (HCC)   . HTN (hypertension)   . Abnormal weight loss   . Leukocytosis   . Acute and chronic respiratory failure with hypoxia (HCC)   . Anemia in neoplastic disease   . Hyperglycemia   . Heme + stool 11/06/2018  . Diabetes (HCC) 2018   PCP:  [name], Paul W, MD Pharmacy:   [facility] - [place], [place] - [address] [place] [address] Phone: [phone] Fax: [phone]     Social Determinants of Health (SDOH) Interventions    Readmission Risk Interventions No flowsheet data found.  

## 2019-10-17 NOTE — Progress Notes (Signed)
[facility]  PROGRESS NOTE  KAYCEN WHITWORTH WFU:[id number] DOB: 1946/01/07 DOA: 10/16/2019 PCP: [name], MD   Brief HPI:   47 old male with a history of COPD, diabetes mellitus type 2, hypertension, history of A. 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  Patient had third cycle of palliative chemo on 10/14/2019, previously on Keytruda which was discontinued due to generalized macular papular rash which is still persistent but improving.    Subjective   Patient seen and examined, denies any complaints.  Feeling much better than yesterday.  Rash is improving with prednisone.   Assessment/Plan:     1. Generalized weakness/failure to thrive-patient has stage IIIC/IV lung cancer, currently on palliative chemotherapy.  Patient is on IV normal saline at 100 mL/h.  B12 1041, magnesium 1.6.  Will replace magnesium.  Continue with IV hydration. 2. Skin rash-improving, secondary to chemotherapy Keytruda, now is off chemotherapy.  Continue prednisone taper. 3. Stage IIIc/IV NSC lung cancer, squamous cell carcinoma-patient has chemo is requesting more lower lobe bronchus large necrotic mass extending to the carina with associated obstruction of the right lower lobe bronchus with postobstructive collapse/consolidation and necrosis of right lower lobe, mediastinal lymphadenopathy with potential blood in right pleural effusion.  Patient was supposed to get Neulasta.  Oncology has seen the patient and do not recommend Neulasta in the hospital.  They will provide Neulasta as outpatient. 4. Diabetes mellitus type 2-continue sliding scale insulin with NovoLog. 5. COPD-no  exacerbation, patient is on oxygen 2 L/min.  He is on oxygen at home 2.  Continue Anoro Ellipta, bronchodilators as needed. 6. Hypertension-blood pressure is stable-he was taken off lisinopril by patient's PCP. 7. Paroxysmal atrial fibrillation-continue Cardizem.  Currently in normal sinus rhythm.  No anticoagulation due to anemia as per patient's cardiologist Dr. [name].    SpO2: 91 % O2 Flow Rate (L/min): 2 L/min   COVID-19 Labs  No results for input(s): DDIMER, FERRITIN, LDH, CRP in the last 72 hours.  Lab Results  Component Value Date   SARSCOV2NAA NEGATIVE 10/16/2019   [place] NEGATIVE 08/06/2019   [place] NEGATIVE 12/26/2018     CBG: Recent Labs  Lab 10/16/19 1230 10/16/19 1630 10/16/19 2124 10/17/19 0731 10/17/19 1158  GLUCAP 201* 165* 145* 110* 275*    CBC: Recent Labs  Lab 10/14/19 0928 10/16/19 0403 10/17/19 0531  WBC 13.6* 10.3 5.5  NEUTROABS 12.9* 10.0*  --   HGB 8.5* 8.3* 8.1*  HCT 27.6* 27.1* 26.5*  MCV 95.8 96.4 95.3  PLT 197 168 121*    Basic Metabolic Panel: Recent Labs  Lab 10/14/19 0928 10/16/19 0403 10/17/19 0531  NA 139 139 131*  K 4.0 3.8 3.4*  CL 102 98 94*  CO2 25 30 28   GLUCOSE 280* 183* 114*  BUN 24* 24* 16  CREATININE 0.86 0.71 0.59*  CALCIUM 8.9 8.0* 7.6*  MG  --   --  1.6*     Liver Function Tests: Recent Labs  Lab 10/14/19 0928 10/16/19 0403 10/17/19 0531  AST 9* 12* 19  ALT 17 15 18   ALKPHOS 87 66 60  BILITOT 0.3 0.6 0.6  PROT 7.3 6.8 6.4*  ALBUMIN 2.1* 2.4* 2.2*        DVT prophylaxis: Lovenox  Code Status: DNR  Family Communication: No family at bedside  Disposition Plan:   Status is: Inpatient  Dispo: The patient is from: Home              Anticipated d/c is to: Home               Anticipated d/c date is: 10/20/2019              Patient currently admitted with dehydration, generalized weakness.  Started on IV normal saline.  P.o. prednisone for rash.        Scheduled  medications:  . vitamin C  1,000 mg Oral Daily  . budesonide (PULMICORT) nebulizer solution  0.25 mg Nebulization BID  . Chlorhexidine Gluconate Cloth  6 each Topical Daily  . diltiazem  30 mg Oral BID  . docusate sodium  100 mg Oral BID  . enoxaparin (LOVENOX) injection  40 mg Subcutaneous Q24H  . feeding supplement (ENSURE ENLIVE)  237 mL Oral TID BM  . ferrous sulfate  325 mg Oral Q supper  . folic acid  1 mg Oral Daily  . gabapentin  300 mg Oral QHS  . guaiFENesin  600 mg Oral BID  . insulin aspart  0-5 Units Subcutaneous QHS  . insulin aspart  0-9 Units Subcutaneous TID WC  . loratadine  10 mg Oral Daily  . multivitamin with minerals  1 tablet Oral Daily  . omega-3 acid ethyl esters  1 g Oral Daily  . pantoprazole  40 mg Oral Daily  . predniSONE  20 mg Oral Q breakfast  . triamcinolone cream  1 application Topical QID  . umeclidinium-vilanterol  1 puff Inhalation Daily    Consultants:  Oncology  Procedures:  None  Antibiotics:   Anti-infectives (From admission, onward)   None       Objective   Vitals:   10/16/19 2123 10/16/19 2352 10/17/19 0440 10/17/19 0845  BP: 133/84 134/86 (!) 129/92   Pulse: 99 88 94   Resp: 19 18 20    Temp: 97.9 F (36.6 C) 98 F (36.7 C) 98.7 F (37.1 C)   TempSrc: Oral Oral Oral   SpO2: 100% 97% 99% 91%  Weight:      Height:        Intake/Output Summary (Last 24 hours) at 10/17/2019 1316 Last data filed at 10/17/2019 0619 Gross per 24 hour  Intake 130 ml  Output 750 ml  Net -620 ml    05/12 1901 - 05/14 0700 In: 130 [P.O.:120; I.V.:10] Out: 750 [Urine:750]  Filed Weights   10/16/19 0403  Weight: 81.6 kg    Physical Examination:    General-appears in no acute distress  Heart-S1-S2, regular, no murmur auscultated  Lungs-clear to auscultation bilaterally, no wheezing or crackles auscultated  Abdomen-soft, nontender, no organomegaly  Extremities-no edema in the lowerextremities  Neuro-alert, oriented x3,  no focal deficit noted  Skin-maculopapular rash noted on trunk and lower extremities    Data Reviewed:   Recent Results (from the past 240 hour(s))  Urine culture     Status: None   Collection Time: 10/16/19  5:36 AM   Specimen: Urine, Clean Catch  Result Value Ref Range Status   Specimen Description   Final    URINE, CLEAN CATCH Performed at [facility] [address] [place], [place] [address]    Special Requests   Final    NONE Performed at [facility], [place] [address] [place],  [address]  Culture   Final    NO GROWTH Performed at [facility], [place] [address] [place], [place] [address]    Report Status 10/17/2019 FINAL  Final  SARS Coronavirus 2 by RT PCR (hospital order, performed in [facility] hospital lab) Nasopharyngeal Nasopharyngeal Swab     Status: None   Collection Time: 10/16/19  6:12 AM   Specimen: Nasopharyngeal Swab  Result Value Ref Range Status   SARS Coronavirus 2 NEGATIVE NEGATIVE Final    Comment: (NOTE) SARS-CoV-2 target nucleic acids are NOT DETECTED. The SARS-CoV-2 RNA is generally detectable in upper and lower respiratory specimens during the acute phase of infection. The lowest concentration of SARS-CoV-2 viral copies this assay can detect is 250 copies / mL. A negative result does not preclude SARS-CoV-2 infection and should not be used as the sole basis for treatment or other patient management decisions.  A negative result may occur with improper specimen collection / handling, submission of specimen other than nasopharyngeal swab, presence of viral mutation(s) within the areas targeted by this assay, and inadequate number of viral copies (<250 copies / mL). A negative result must be combined with clinical observations, patient history, and epidemiological information. Fact Sheet for Patients:   StrictlyIdeas.no Fact Sheet for Healthcare  Providers: BankingDealers.co.za This test is not yet approved or cleared  by the [place] FDA and has been authorized for detection and/or diagnosis of SARS-CoV-2 by FDA under an Emergency Use Authorization (EUA).  This EUA will remain in effect (meaning this test can be used) for the duration of the COVID-19 declaration under Section 564(b)(1) of the Act, 21 U.S.C. section 360bbb-3(b)(1), unless the authorization is terminated or revoked sooner. Performed at [facility], [place] [address] [place], [place] [address]     No results for input(s): LIPASE, AMYLASE in the last 168 hours. No results for input(s): AMMONIA in the last 168 hours.  Cardiac Enzymes: No results for input(s): CKTOTAL, CKMB, CKMBINDEX, TROPONINI in the last 168 hours. BNP (last 3 results) Recent Labs    08/06/19 1211  BNP 104.0*    ProBNP (last 3 results) No results for input(s): PROBNP in the last 8760 hours.  Studies:  DG Chest 2 View  Result Date: 10/16/2019 CLINICAL DATA:  Lung cancer and weakness EXAM: CHEST - 2 VIEW COMPARISON:  08/06/2019 CT.  PET-CT 09/08/2019 FINDINGS: Right lower chest opacification with volume loss from necrotic mass by comparison CT. Small to moderate right lateral pleural effusion has increased from prior. Left chest is clear. Stable heart size. Right port with tip at the SVC. IMPRESSION: Necrotic right lung mass as seen on recent staging scan. An overlying right pleural effusion has increased from April. Electronically Signed   By: Monte Fantasia M.D.   On: 10/16/2019 04:43   CT HEAD WO CONTRAST  Result Date: 10/16/2019 CLINICAL DATA:  Mental status change, unknown cause. Additional provided: Weakness, history of lung cancer EXAM: CT HEAD WITHOUT CONTRAST TECHNIQUE: Contiguous axial images were obtained from the base of the skull through the vertex without intravenous contrast. COMPARISON:  Brain MRI 08/10/2019 FINDINGS: Brain: Please  note there is limited assessment for intracranial metastatic disease on this noncontrast CT study. Mild ill-defined hypoattenuation within the cerebral white matter is nonspecific, but consistent with chronic small vessel ischemic disease. Stable, mild generalized parenchymal atrophy. There is no acute intracranial hemorrhage. No demarcated cortical infarct. No extra-axial fluid collection. No evidence of intracranial mass. No midline shift. Vascular: No hyperdense vessel.  Atherosclerotic  calcifications Skull: Normal. Negative for fracture or focal lesion. Sinuses/Orbits: Visualized orbits show no acute finding. Paranasal sinus mucosal thickening, most notable within bilateral ethmoid air cells. No significant mastoid effusion. IMPRESSION: 1. Please note there is limited assessment for intracranial metastatic disease on this non-contrast CT study. 2. No evidence of acute intracranial abnormality. 3. Mild generalized parenchymal atrophy and chronic small vessel ischemic disease. 4. Paranasal sinus mucosal thickening, most notably ethmoid. Electronically Signed   By: Kellie Simmering DO   On: 10/16/2019 09:52       Oswald Hillock   [facility] If 7PM-7AM, please contact night-coverage at [email or web address], Office  [phone]   10/17/2019, 1:16 PM  LOS: 0 days

## 2019-10-17 NOTE — Progress Notes (Signed)
Initial Nutrition Assessment  INTERVENTION:   -Ensure Enlive po TID, each supplement provides 350 kcal and 20 grams of protein -Multivitamin with minerals daily  NUTRITION DIAGNOSIS:   Increased nutrient needs related to cancer and cancer related treatments as evidenced by estimated needs.  GOAL:   Patient will meet greater than or equal to 90% of their needs  MONITOR:   PO intake, Supplement acceptance, Labs, Weight trends, I & O's  REASON FOR ASSESSMENT:   Consult Assessment of nutrition requirement/status  ASSESSMENT:   74 y.o. male with medical history significant for COPD, diabetes, hypertension, history of A. 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  **RD working remotely**  Attempted to speak with patient over the phone, no answer at this time. Per review of chart, pt was eating well PTA and symptom onset. Pt last received chemotherapy 5/11.  Pt followed by [facility], last seen 4/20. Note states that the pt likes chocolate Ensure, this RD will order. No PO intakes have been documented yet.  Patient was diagnosed with severe malnutrition in March 2021. Suspect some degree of malnutrition still persists but may be improving given improved intakes recently.   Per weight records, pt's weight has started to increase since 4/20. UBW is stated in [facility] note as 210 lbs. Weight is still less than this at 180 lbs.  I/Os:  -620 ml since admit UOP: 750 ml x 24 hrs  Medications: Vitamin C, Colace, Ferrous sulfate, Folic acid, [place], KLOR-CON, IV Mg sulfate Labs reviewed: CBGs:  110-145 Low Na, K, Mg  NUTRITION - FOCUSED PHYSICAL EXAM:  Working remotely.  Diet Order:   Diet Order            Diet Carb Modified Fluid consistency: Thin; Room service appropriate? Yes  Diet effective now              EDUCATION NEEDS:   No education needs have been identified at this time  Skin:  Skin Assessment: Reviewed RN Assessment  Last BM:  5/13 -type 3  Height:   Ht Readings from Last 1 Encounters:  10/16/19 6\' 2"  (1.88 m)    Weight:   Wt Readings from Last 1 Encounters:  10/16/19 81.6 kg    Ideal Body Weight:  86.3 kg  BMI:  Body mass index is 23.11 kg/m.  Estimated Nutritional Needs:   Kcal:  2400-2600  Protein:  115-130g  Fluid:  2.4L/day  Clayton Bibles, MS, RD, LDN Inpatient Clinical Dietitian Contact information available via Amion

## 2019-10-17 NOTE — Evaluation (Signed)
Occupational Therapy Evaluation Patient Details Name: Jeremy [name] MRN: [id number] DOB: 10-29-1945 Today's Date: 10/17/2019    History of Present Illness Patient is 74 y.o. male with stage IIIc/IV (T4, N2, M0/M Ia) non-small cell lung cancer, squamous cell carcinoma. Patient is currently receiving systemic chemotherapy with carboplatin and paclitaxel.  He received Keytruda with cycle #1 only but it was discontinued secondary to significant skin rash.  Status post 3 cycles of his treatment. Patient presented to the ED due to fall at home while attempting to ambulate to bathroom at night. He has had progressive weakness since initiating chemotherapy. Pt is on 2L/min at home PRN.    Clinical Impression   Jeremy [name] is a 74 year old man admitted to hospital after fall at home. On evaluation patient presents with functional upper body strength with MMT but overall generalized weakness and poor activity tolerance. O2 sats monitored on RA and patient predominantly maintained between 90-91% with short ambulation. Patient's HR into 120s with activity. Patient able to perform ADLs with set up to [facility] for safety due to patient being a fall risk. Patient ambulated around bed to recliner and then to bathroom and back with RW with a mildly unsteady gait. Patient will benefit from OT services while in hospital in order to maintain or improve independence with ADLs and mobility in order to return home at discharge.    Follow Up Recommendations  No OT follow up    Equipment Recommendations       Recommendations for Other Services       Precautions / Restrictions Precautions Precautions: Fall Restrictions Weight Bearing Restrictions: No      Mobility Bed Mobility Overal bed mobility: Needs Assistance Bed Mobility: Supine to Sit;Sit to Supine     Supine to sit: Min assist Sit to supine: Supervision   General bed mobility comments: Min assist for hand hold to pull up into  sitting.  Transfers Overall transfer level: Needs assistance Equipment used: Rolling walker (2 wheeled) Transfers: Sit to/from Stand Sit to Stand: Min guard         General transfer comment: Min assist to stand from low bed height and toilet. Verbal cues to push up. Min guard for ambulation to bathroom and back to bed.    Balance Overall balance assessment: Mild deficits observed, not formally tested Sitting-balance support: Feet supported Sitting balance-Leahy Scale: Good     Standing balance support: Bilateral upper extremity supported Standing balance-Leahy Scale: Fair                             ADL either performed or assessed with clinical judgement   ADL Overall ADL's : Needs assistance/impaired     Grooming: Min guard Grooming Details (indicate cue type and reason): Patient stood at sink and washed hands with therapit providing CGA for safety and management of IV line. Upper Body Bathing: Set up   Lower Body Bathing: Min guard   Upper Body Dressing : Set up   Lower Body Dressing: Set up Lower Body Dressing Details (indicate cue type and reason): Patient donned socks sitting in recliner. Toilet Transfer: Geophysical data processor Details (indicate cue type and reason): Min guard for toilet transfer. Toileting- Water quality scientist and Hygiene: Min guard;Sit to/from stand   Tub/ Shower Transfer: Min guard;Shower seat   Functional mobility during ADLs: Min guard       Vision   Vision Assessment?: No apparent visual deficits  Perception     Praxis      Pertinent Vitals/Pain Pain Assessment: No/denies pain     Hand Dominance Right   Extremity/Trunk Assessment Upper Extremity Assessment Upper Extremity Assessment: Overall WFL for tasks assessed   Lower Extremity Assessment Lower Extremity Assessment: Defer to PT evaluation   Cervical / Trunk Assessment Cervical / Trunk Assessment: Normal    Communication Communication Communication: No difficulties   Cognition Arousal/Alertness: Awake/alert Behavior During Therapy: WFL for tasks assessed/performed Overall Cognitive Status: Within Functional Limits for tasks assessed                                     General Comments  Pt on 2L/min during session.     Exercises     Shoulder Instructions      Home Living Family/patient expects to be discharged to:: Private residence Living Arrangements: Spouse/significant other Available Help at Discharge: Family Type of Home: House Home Access: Stairs to enter [employer] of Steps: 2 in garage; 4-5 steps at back(pt typically enters throug garage) Entrance Stairs-Rails: None;Can reach both(no rails at garage) Home Layout: One level     Bathroom Shower/Tub: Occupational psychologist: Handicapped height(BSC over the toilet to elevate) Bathroom Accessibility: Yes   Home Equipment: [place] - single point;Shower seat - built in;Grab bars - tub/shower   Additional Comments: Pt enjoys [employer] radio as a hobby.      Prior Functioning/Environment Level of Independence: Independent        Comments: pt's wife performs homemaking tasks however he is independent with ADL's and mobility.        OT Problem List: Decreased strength;Decreased activity tolerance;Impaired balance (sitting and/or standing);Cardiopulmonary status limiting activity;Decreased knowledge of use of DME or AE      OT Treatment/Interventions: Self-care/ADL training;Energy conservation;Therapeutic activities;Patient/family education;DME and/or AE instruction    OT Goals(Current goals can be found in the care plan section) Acute Rehab OT Goals Patient Stated Goal: to get up and walk by himself OT Goal Formulation: With patient Time For Goal Achievement: 10/31/19 Potential to Achieve Goals: Good  OT Frequency: Min 2X/week   Barriers to D/C:            Co-evaluation               AM-PAC OT "6 Clicks" Daily Activity     Outcome Measure Help from another person eating meals?: None Help from another person taking care of personal grooming?: A Little Help from another person toileting, which includes using toliet, bedpan, or urinal?: A Little Help from another person bathing (including washing, rinsing, drying)?: A Little Help from another person to put on and taking off regular upper body clothing?: A Little Help from another person to put on and taking off regular lower body clothing?: A Little 6 Click Score: 19   End of Session Equipment Utilized During Treatment: Gait belt;Rolling walker Nurse Communication: Mobility status  Activity Tolerance: Patient tolerated treatment well Patient left: in bed;with call bell/phone within reach;with bed alarm set;with family/visitor present  OT Visit Diagnosis: Unsteadiness on feet (R26.81);Muscle weakness (generalized) (M62.81)                Time: 2440-1027 OT Time Calculation (min): 38 min Charges:  OT General Charges $OT Visit: 1 Visit OT Evaluation $OT Eval Low Complexity: 1 Low OT Treatments $Self Care/Home Management : 23-37 mins  Irineo Gaulin, OTR/L Acute Care Rehab  Services  Office [phone]   Lenward Chancellor 10/17/2019, 4:03 PM

## 2019-10-18 ENCOUNTER — Inpatient Hospital Stay (HOSPITAL_COMMUNITY): Payer: Medicare Other

## 2019-10-18 DIAGNOSIS — I10 Essential (primary) hypertension: Secondary | ICD-10-CM

## 2019-10-18 DIAGNOSIS — D630 Anemia in neoplastic disease: Secondary | ICD-10-CM

## 2019-10-18 DIAGNOSIS — J431 Panlobular emphysema: Secondary | ICD-10-CM

## 2019-10-18 LAB — CBC
HCT: 25.8 % — ABNORMAL LOW (ref 39.0–52.0)
Hemoglobin: 8 g/dL — ABNORMAL LOW (ref 13.0–17.0)
MCH: 29.4 pg (ref 26.0–34.0)
MCHC: 31 g/dL (ref 30.0–36.0)
MCV: 94.9 fL (ref 80.0–100.0)
Platelets: 131 10*3/uL — ABNORMAL LOW (ref 150–400)
RBC: 2.72 MIL/uL — ABNORMAL LOW (ref 4.22–5.81)
RDW: 17.7 % — ABNORMAL HIGH (ref 11.5–15.5)
WBC: 5.5 10*3/uL (ref 4.0–10.5)
nRBC: 0 % (ref 0.0–0.2)

## 2019-10-18 LAB — BASIC METABOLIC PANEL
Anion gap: 8 (ref 5–15)
BUN: 15 mg/dL (ref 8–23)
CO2: 27 mmol/L (ref 22–32)
Calcium: 7.6 mg/dL — ABNORMAL LOW (ref 8.9–10.3)
Chloride: 97 mmol/L — ABNORMAL LOW (ref 98–111)
Creatinine, Ser: 0.54 mg/dL — ABNORMAL LOW (ref 0.61–1.24)
GFR calc Af Amer: >60 mL/min (ref >60)
GFR calc non Af Amer: >60 mL/min (ref >60)
Glucose, Bld: 110 mg/dL — ABNORMAL HIGH (ref 70–99)
Potassium: 3.5 mmol/L (ref 3.5–5.1)
Sodium: 132 mmol/L — ABNORMAL LOW (ref 135–145)

## 2019-10-18 LAB — GLUCOSE, CAPILLARY
Glucose-Capillary: 120 mg/dL — ABNORMAL HIGH (ref 70–99)
Glucose-Capillary: 195 mg/dL — ABNORMAL HIGH (ref 70–99)

## 2019-10-18 MED ORDER — HEPARIN SOD (PORK) LOCK FLUSH 100 UNIT/ML IV SOLN
500.0000 [IU] | INTRAVENOUS | Status: AC | PRN
  Administered 2019-10-18: 500 [IU]
  Filled 2019-10-18: qty 5

## 2019-10-18 MED ORDER — ACETAMINOPHEN 325 MG PO TABS
650.0000 mg | ORAL_TABLET | Freq: Four times a day (QID) | ORAL | Status: DC | PRN
  Administered 2019-10-18: 650 mg via ORAL
  Filled 2019-10-18: qty 2

## 2019-10-18 MED ORDER — LIDOCAINE 5 % EX PTCH
1.0000 | MEDICATED_PATCH | CUTANEOUS | Status: DC
  Administered 2019-10-18: 1 via TRANSDERMAL
  Filled 2019-10-18: qty 1

## 2019-10-18 NOTE — Discharge Summary (Signed)
Physician Discharge Summary  Jeremy [name] TIR:[id number] DOB: 09/03/1945 DOA: 10/16/2019  PCP: [name], MD  Admit date: 10/16/2019 Discharge date: 10/18/2019  Time spent: 50 minutes  Recommendations for Outpatient Follow-up:  1. Follow-up Dr. Julien Nordmann  as outpatient   Discharge Diagnoses:  Principal Problem:   Failure to thrive in adult Active Problems:   COPD (chronic obstructive pulmonary disease) (HCC)   HTN (hypertension)   Anemia in neoplastic disease   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HCC)   Generalized weakness   Discharge Condition: Stable  Diet recommendation: Regular diet  Filed Weights   10/16/19 0403  Weight: 81.6 kg    History of present illness:  74 old male with a history of COPD, diabetes mellitus type 2, hypertension, history of A. 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  Patient had third cycle of palliative chemo on 10/14/2019, previously on Keytruda which was discontinued due to generalized macular papular rash which is still persistent but improving.   Hospital Course:   1. Generalized weakness/failure to thrive-patient has stage IIIC/IV lung cancer, currently on palliative chemotherapy.  Patient is on IV normal saline at 100 mL/h.  B12 1041, magnesium 1.6. Patient was given 2 g of magnesium sulfate IV x1. . 2. Skin rash-improving, secondary to chemotherapy Keytruda, now is off chemotherapy.  Continue prednisone taper. 3. Stage IIIc/IV NSC lung cancer, squamous cell carcinoma-patient has chemo is requesting more lower lobe bronchus large necrotic mass extending to the carina with associated obstruction of the right  lower lobe bronchus with postobstructive collapse/consolidation and necrosis of right lower lobe, mediastinal lymphadenopathy with potential blood in right pleural effusion.  Patient was supposed to get Neulasta.  Oncology has seen the patient and do not recommend Neulasta in the hospital.  They will provide Neulasta as outpatient. 4. Diabetes mellitus type 2-continue Metformin 5. COPD-no exacerbation, patient is on oxygen 2 L/min.  He is on oxygen at home 2.  Continue Anoro Ellipta, bronchodilators as needed. 6. Hypertension-blood pressure is stable-he was taken off lisinopril by patient's PCP. 7. Paroxysmal atrial fibrillation-continue Cardizem.  Currently in normal sinus rhythm.  No anticoagulation due to anemia as per patient's cardiologist Dr. [name]. 8. Right hip pain-x-ray of the right hip was unremarkable. Only showed arthritic changes. No acute fracture.   Procedures:  None  Consultations:  Oncology  Discharge Exam: Vitals:   10/18/19 0525 10/18/19 0923  BP: (!) 144/85   Pulse: 99   Resp: (!) 22   Temp: 98 F (36.7 C)   SpO2: 94% 96%    General: Appears in no acute distress Cardiovascular: S1-S2, regular Respiratory: Clear to auscultation bilaterally  Discharge Instructions   Discharge Instructions    Diet - low sodium heart healthy   Complete by: As directed    Increase activity slowly   Complete by: As directed      Allergies as of 10/18/2019      Reactions   Aleve [naproxen Sodium] Hives, Rash      Medication List    TAKE these medications   acetaminophen 500 MG tablet Commonly known as: TYLENOL Take 500 mg by mouth every 4 (four) hours as needed (for pain.).   albuterol 108 (90 Base) MCG/ACT inhaler Commonly  known as: VENTOLIN HFA Inhale 2 puffs into the lungs every 6 (six) hours as needed for wheezing or shortness of breath.   diltiazem 30 MG tablet Commonly known as: Cardizem Take 1 tablet (30 mg total) by mouth 2 (two) times daily.    docusate sodium 100 MG capsule Commonly known as: COLACE Take 100 mg by mouth 2 (two) times daily.   doxycycline 50 MG capsule Commonly known as: MONODOX Take 50 mg by mouth 2 (two) times daily as needed (rosacea flare ups.).   ferrous sulfate 325 (65 FE) MG tablet Take 325 mg by mouth daily with supper.   Fish Oil 1000 MG Caps Take 1,000 mg by mouth daily.   Flovent HFA 110 MCG/ACT inhaler Generic drug: fluticasone 1 puff 2 (two) times daily.   folic acid 1 MG tablet Commonly known as: FOLVITE Take 1 mg by mouth daily.   gabapentin 300 MG capsule Commonly known as: NEURONTIN Take 600 mg by mouth at bedtime.   lidocaine-prilocaine cream Commonly known as: EMLA Apply to the Port-A-Cath site 30 minutes before treatment   loratadine 10 MG tablet Commonly known as: CLARITIN Take 10 mg by mouth daily.   metFORMIN 500 MG 24 hr tablet Commonly known as: GLUCOPHAGE-XR Take 500-1,000 mg by mouth See admin instructions. Take 2 tablets (1000 mg) by mouth in the morning & 1 tablet (500 mg) by mouth at night.   metroNIDAZOLE 0.75 % cream Commonly known as: METROCREAM Apply 1 application topically 2 (two) times daily as needed (facial rosacea).   mometasone 0.1 % cream Commonly known as: ELOCON Apply 1 application topically 2 (two) times daily as needed (psoriasis).   pantoprazole 40 MG tablet Commonly known as: Protonix 1 po 30 mins prior to first meal   predniSONE 20 MG tablet Commonly known as: Deltasone Please take 4 tablets (80 mg) daily for one week, followed by 3 tablets (60 mg) daily for 1 week, followed by 2 tablets (20 mg) for one week   prochlorperazine 10 MG tablet Commonly known as: COMPAZINE Take 1 tablet (10 mg total) by mouth every 6 (six) hours as needed for nausea or vomiting.   traMADol 50 MG tablet Commonly known as: ULTRAM Take 50 mg by mouth every 4 (four) hours as needed. for pain   triamcinolone lotion 0.1 % Commonly known as: KENALOG Apply  1 application topically 4 (four) times daily.   umeclidinium-vilanterol 62.5-25 MCG/INH Aepb Commonly known as: ANORO ELLIPTA Inhale 1 puff into the lungs daily.   vitamin C 1000 MG tablet Take 1,000 mg by mouth daily.            Durable Medical Equipment  (From admission, onward)         Start     Ordered   10/18/19 1251  For home use only DME Walker rolling  Once    Question Answer Comment  Walker: With 5 Inch Wheels   Patient needs a walker to treat with the following condition Non-small cell lung cancer ([place])      10/18/19 1250         Allergies  Allergen Reactions  . Aleve [Naproxen Sodium] Hives and Rash   Follow-up [facility] Follow up.   Contact information:  [address], [place], [place] [address]  Phone: [phone]           The results of significant diagnostics from this hospitalization (including imaging, microbiology, ancillary and laboratory) are listed below  for reference.    Significant Diagnostic Studies: DG Chest 2 View  Result Date: 10/16/2019 CLINICAL DATA:  Lung cancer and weakness EXAM: CHEST - 2 VIEW COMPARISON:  08/06/2019 CT.  PET-CT 09/08/2019 FINDINGS: Right lower chest opacification with volume loss from necrotic mass by comparison CT. Small to moderate right lateral pleural effusion has increased from prior. Left chest is clear. Stable heart size. Right port with tip at the SVC. IMPRESSION: Necrotic right lung mass as seen on recent staging scan. An overlying right pleural effusion has increased from April. Electronically Signed   By: Monte Fantasia M.D.   On: 10/16/2019 04:43   CT HEAD WO CONTRAST  Result Date: 10/16/2019 CLINICAL DATA:  Mental status change, unknown cause. Additional provided: Weakness, history of lung cancer EXAM: CT HEAD WITHOUT CONTRAST TECHNIQUE: Contiguous axial images were obtained from the base of the skull through the vertex without intravenous contrast. COMPARISON:   Brain MRI 08/10/2019 FINDINGS: Brain: Please note there is limited assessment for intracranial metastatic disease on this noncontrast CT study. Mild ill-defined hypoattenuation within the cerebral white matter is nonspecific, but consistent with chronic small vessel ischemic disease. Stable, mild generalized parenchymal atrophy. There is no acute intracranial hemorrhage. No demarcated cortical infarct. No extra-axial fluid collection. No evidence of intracranial mass. No midline shift. Vascular: No hyperdense vessel.  Atherosclerotic calcifications Skull: Normal. Negative for fracture or focal lesion. Sinuses/Orbits: Visualized orbits show no acute finding. Paranasal sinus mucosal thickening, most notable within bilateral ethmoid air cells. No significant mastoid effusion. IMPRESSION: 1. Please note there is limited assessment for intracranial metastatic disease on this non-contrast CT study. 2. No evidence of acute intracranial abnormality. 3. Mild generalized parenchymal atrophy and chronic small vessel ischemic disease. 4. Paranasal sinus mucosal thickening, most notably ethmoid. Electronically Signed   By: Kellie Simmering DO   On: 10/16/2019 09:52   ECHOCARDIOGRAM COMPLETE  Result Date: 10/09/2019    ECHOCARDIOGRAM REPORT   Patient Name:   ILAI HILLER Thackston Date of Exam: 10/09/2019 Medical Rec #:  [id number]     Height:       74.0 in Accession #:    [id number]    Weight:       179.6 lb Date of Birth:  1946/04/10     BSA:          2.076 m Patient Age:    84 years      BP:           117/71 mmHg Patient Gender: M             HR:           94 bpm. Exam Location:  [facility] Procedure: 2D Echo, Cardiac Doppler and Color Doppler Indications:     I48.0 PAF  History:         Patient has no prior history of Echocardiogram examinations.                  COPD and Stage IV lung cancer. Anemia, Arrythmias:Atrial                  Fibrillation; Risk Factors:Hypertension, Diabetes and Former                  Smoker.  Sonographer:     Jeneen Montgomery RDMS, RVT, RDCS Referring Phys:  [id number] Alphonse Guild BRANCH Diagnosing Phys: Kate Sable MD  Sonographer Comments: Technically difficult study due to poor echo windows. Image acquisition challenging due to COPD and Image  acquisition challenging due to patient body habitus. IMPRESSIONS  1. Left ventricular ejection fraction, by estimation, is 60 to 65%. The left ventricle has normal function. Left ventricular endocardial border not optimally defined to evaluate regional wall motion. Left ventricular diastolic parameters are consistent with Grade I diastolic dysfunction (impaired relaxation).  2. Right ventricular systolic function is normal. The right ventricular size is moderately enlarged.  3. Right atrial size was mild to moderately dilated.  4. The mitral valve was not well visualized. Trivial mitral valve regurgitation.  5. Tricuspid valve regurgitation unable to assess.  6. The aortic valve was not well visualized. Aortic valve regurgitation is mild.  7. Pulmonic valve regurgitation unable to assess.  8. Aortic dilatation noted. There is mild dilatation of the aortic root. Conclusion(s)/Recommendation(s): Suboptimal study. Consider contrast enhancement for future studies. FINDINGS  Left Ventricle: Left ventricular ejection fraction, by estimation, is 60 to 65%. The left ventricle has normal function. Left ventricular endocardial border not optimally defined to evaluate regional wall motion. The left ventricular internal cavity size was normal in size. There is no left ventricular hypertrophy. Left ventricular diastolic parameters are consistent with Grade I diastolic dysfunction (impaired relaxation). Normal left ventricular filling pressure. Right Ventricle: The right ventricular size is moderately enlarged. Right vetricular wall thickness was not assessed. Right ventricular systolic function is normal. Left Atrium: Left atrial size was normal in size. Right Atrium: Right atrial size was mild to  moderately dilated. Pericardium: There is no evidence of pericardial effusion. Mitral Valve: The mitral valve was not well visualized. Trivial mitral valve regurgitation. Tricuspid Valve: The tricuspid valve is not well visualized. Tricuspid valve regurgitation unable to assess. Aortic Valve: The aortic valve was not well visualized. Aortic valve regurgitation is mild. Pulmonic Valve: The pulmonic valve was not well visualized. Pulmonic valve regurgitation unable to assess. Aorta: Aortic dilatation noted. There is mild dilatation of the aortic root. Venous: The inferior vena cava was not well visualized. IAS/Shunts: The interatrial septum was not well visualized.  LEFT VENTRICLE PLAX 2D LVIDd:         3.88 cm      Diastology LVIDs:         2.68 cm      LV e' lateral:   5.62 cm/s LV PW:         0.96 cm      LV E/e' lateral: 11.1 LV IVS:        1.21 cm      LV e' medial:    7.86 cm/s LVOT diam:     2.00 cm      LV E/e' medial:  8.0 LV SV:         57 LV SV Index:   27 LVOT Area:     3.14 cm  LV Volumes (MOD) LV vol d, MOD A2C: 68.9 ml LV vol d, MOD A4C: 118.0 ml LV vol s, MOD A2C: 37.0 ml LV vol s, MOD A4C: 47.6 ml LV SV MOD A2C:     31.9 ml LV SV MOD A4C:     118.0 ml LV SV MOD BP:      54.6 ml RIGHT VENTRICLE RV S prime:     14.50 cm/s TAPSE (M-mode): 2.8 cm LEFT ATRIUM             Index LA diam:        2.70 cm 1.30 cm/m LA Vol (A2C):   56.7 ml 27.31 ml/m LA Vol (A4C):   40.7 ml 19.60 ml/m  LA Biplane Vol:         23.90 ml/m  AORTIC VALVE LVOT Vmax:   103.00 cm/s LVOT Vmean:  64.000 cm/s LVOT VTI:    0.181 m  AORTA Ao Root diam: 3.80 cm MITRAL VALVE               TRICUSPID VALVE MV Area (PHT):             TR Peak grad:   3.0 mmHg MV Decel Time: 143 msec    TR Vmax:        86.30 cm/s MR Peak grad: 21.8 mmHg MR Vmax:      233.50 cm/s  SHUNTS MV E velocity: 62.60 cm/s  Systemic VTI:  0.18 m MV A velocity: 70.80 cm/s  Systemic Diam: 2.00 cm MV E/A ratio:  0.88 Kate Sable MD Electronically signed by Kate Sable MD Signature Date/Time: 10/09/2019/5:58:14 PM    Final    DG HIP UNILAT WITH PELVIS 2-3 VIEWS RIGHT  Result Date: 10/18/2019 CLINICAL DATA:  Right hip pain for 5 days following fall, initial encounter EXAM: DG HIP (WITH OR WITHOUT PELVIS) 2-3V RIGHT COMPARISON:  None. 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 No acute fracture or dislocation is seen. No soft tissue abnormality is noted. IMPRESSION: Degenerative change without acute abnormality. Electronically Signed   By: Inez Catalina M.D.   On: 10/18/2019 12:21    Microbiology: Recent Results (from the past 240 hour(s))  Urine culture     Status: None   Collection Time: 10/16/19  5:36 AM   Specimen: Urine, Clean Catch  Result Value Ref Range Status   Specimen Description   Final    URINE, CLEAN CATCH Performed at [facility], [place] [address], [place], [place] [address]    Special Requests   Final    NONE Performed at [facility], [place] [address], [place], [place] [address]    Culture   Final    NO GROWTH Performed at [facility], [place] [address], [place], [place] [address]    Report Status 10/17/2019 FINAL  Final  SARS Coronavirus 2 by RT PCR (hospital order, performed in [facility] hospital lab) Nasopharyngeal Nasopharyngeal Swab     Status: None   Collection Time: 10/16/19  6:12 AM   Specimen: Nasopharyngeal Swab  Result Value Ref Range Status   SARS Coronavirus 2 NEGATIVE NEGATIVE Final    Comment: (NOTE) SARS-CoV-2 target nucleic acids are NOT DETECTED. The SARS-CoV-2 RNA is generally detectable in upper and lower respiratory specimens during the acute phase of infection. The lowest concentration of SARS-CoV-2 viral copies this assay can detect is 250 copies / mL. A negative result does not preclude SARS-CoV-2 infection and should not be used as the sole basis for treatment or other patient management decisions.  A negative result may occur  with improper specimen collection / handling, submission of specimen other than nasopharyngeal swab, presence of viral mutation(s) within the areas targeted by this assay, and inadequate number of viral copies (<250 copies / mL). A negative result must be combined with clinical observations, patient history, and epidemiological information. Fact Sheet for Patients:   StrictlyIdeas.no Fact Sheet for Healthcare Providers: BankingDealers.co.za This test is not yet approved or cleared  by the [place] FDA and has been authorized for detection and/or diagnosis of SARS-CoV-2 by FDA under an Emergency Use Authorization (EUA).  This EUA will remain in effect (meaning this test can  be used) for the duration of the COVID-19 declaration under Section 564(b)(1) of the Act, 21 U.S.C. section 360bbb-3(b)(1), unless the authorization is terminated or revoked sooner. Performed at [facility], [place] [address] [place], [place] [address]      Labs: Basic Metabolic Panel: Recent Labs  Lab 10/14/19 0928 10/16/19 0403 10/17/19 0531 10/18/19 0404  NA 139 139 131* 132*  K 4.0 3.8 3.4* 3.5  CL 102 98 94* 97*  CO2 25 30 28 27   GLUCOSE 280* 183* 114* 110*  BUN 24* 24* 16 15  CREATININE 0.86 0.71 0.59* 0.54*  CALCIUM 8.9 8.0* 7.6* 7.6*  MG  --   --  1.6*  --    Liver Function Tests: Recent Labs  Lab 10/14/19 0928 10/16/19 0403 10/17/19 0531  AST 9* 12* 19  ALT 17 15 18   ALKPHOS 87 66 60  BILITOT 0.3 0.6 0.6  PROT 7.3 6.8 6.4*  ALBUMIN 2.1* 2.4* 2.2*   No results for input(s): LIPASE, AMYLASE in the last 168 hours. No results for input(s): AMMONIA in the last 168 hours. CBC: Recent Labs  Lab 10/14/19 0928 10/16/19 0403 10/17/19 0531 10/18/19 0404  WBC 13.6* 10.3 5.5 5.5  NEUTROABS 12.9* 10.0*  --   --   HGB 8.5* 8.3* 8.1* 8.0*  HCT 27.6* 27.1* 26.5* 25.8*  MCV 95.8 96.4 95.3 94.9  PLT 197 168 121* 131*    Cardiac Enzymes: No results for input(s): CKTOTAL, CKMB, CKMBINDEX, TROPONINI in the last 168 hours. BNP: BNP (last 3 results) Recent Labs    08/06/19 1211  BNP 104.0*    ProBNP (last 3 results) No results for input(s): PROBNP in the last 8760 hours.  CBG: Recent Labs  Lab 10/17/19 1158 10/17/19 1628 10/17/19 2140 10/18/19 0730 10/18/19 1132  GLUCAP 275* 275* 214* 120* 195*       Signed:  Oswald Hillock MD.  [facility] 10/18/2019, 12:54 PM

## 2019-10-18 NOTE — Progress Notes (Signed)
PT Cancellation Note  Patient Details Name: Jeremy [name] MRN: [id number] DOB: Nov 11, 1945   Cancelled Treatment:    Reason Eval/Treat Not Completed: Patient at procedure or test/unavailable. Pt being taken off the unit for x-ray.   Galen Manila 10/18/2019, 10:15 AM

## 2019-10-19 ENCOUNTER — Emergency Department (HOSPITAL_COMMUNITY): Payer: Medicare Other

## 2019-10-19 ENCOUNTER — Other Ambulatory Visit: Payer: Self-pay

## 2019-10-19 ENCOUNTER — Encounter (HOSPITAL_COMMUNITY): Payer: Self-pay

## 2019-10-19 ENCOUNTER — Inpatient Hospital Stay (HOSPITAL_COMMUNITY)
Admission: EM | Admit: 2019-10-19 | Discharge: 2019-10-28 | DRG: 871 | Disposition: A | Discharge status: Home Health | Payer: Medicare Other | Attending: Family Medicine | Admitting: Family Medicine

## 2019-10-19 DIAGNOSIS — J90 Pleural effusion, not elsewhere classified: Secondary | ICD-10-CM | POA: Diagnosis not present

## 2019-10-19 DIAGNOSIS — I110 Hypertensive heart disease with heart failure: Secondary | ICD-10-CM | POA: Diagnosis present

## 2019-10-19 DIAGNOSIS — J189 Pneumonia, unspecified organism: Secondary | ICD-10-CM | POA: Diagnosis present

## 2019-10-19 DIAGNOSIS — Z6823 Body mass index (BMI) 23.0-23.9, adult: Secondary | ICD-10-CM

## 2019-10-19 DIAGNOSIS — A4181 Sepsis due to Enterococcus: Principal | ICD-10-CM | POA: Diagnosis present

## 2019-10-19 DIAGNOSIS — Z7951 Long term (current) use of inhaled steroids: Secondary | ICD-10-CM | POA: Diagnosis not present

## 2019-10-19 DIAGNOSIS — I471 Supraventricular tachycardia: Secondary | ICD-10-CM | POA: Diagnosis not present

## 2019-10-19 DIAGNOSIS — E119 Type 2 diabetes mellitus without complications: Secondary | ICD-10-CM | POA: Diagnosis present

## 2019-10-19 DIAGNOSIS — R627 Adult failure to thrive: Secondary | ICD-10-CM | POA: Diagnosis present

## 2019-10-19 DIAGNOSIS — L4050 Arthropathic psoriasis, unspecified: Secondary | ICD-10-CM | POA: Diagnosis present

## 2019-10-19 DIAGNOSIS — A412 Sepsis due to unspecified staphylococcus: Secondary | ICD-10-CM | POA: Diagnosis present

## 2019-10-19 DIAGNOSIS — J9621 Acute and chronic respiratory failure with hypoxia: Secondary | ICD-10-CM | POA: Diagnosis present

## 2019-10-19 DIAGNOSIS — C3490 Malignant neoplasm of unspecified part of unspecified bronchus or lung: Secondary | ICD-10-CM | POA: Diagnosis not present

## 2019-10-19 DIAGNOSIS — R809 Proteinuria, unspecified: Secondary | ICD-10-CM | POA: Diagnosis present

## 2019-10-19 DIAGNOSIS — R509 Fever, unspecified: Secondary | ICD-10-CM | POA: Diagnosis not present

## 2019-10-19 DIAGNOSIS — C3491 Malignant neoplasm of unspecified part of right bronchus or lung: Secondary | ICD-10-CM | POA: Diagnosis present

## 2019-10-19 DIAGNOSIS — A419 Sepsis, unspecified organism: Secondary | ICD-10-CM | POA: Diagnosis not present

## 2019-10-19 DIAGNOSIS — T451X5D Adverse effect of antineoplastic and immunosuppressive drugs, subsequent encounter: Secondary | ICD-10-CM | POA: Diagnosis not present

## 2019-10-19 DIAGNOSIS — N390 Urinary tract infection, site not specified: Secondary | ICD-10-CM | POA: Diagnosis present

## 2019-10-19 DIAGNOSIS — R0689 Other abnormalities of breathing: Secondary | ICD-10-CM | POA: Diagnosis not present

## 2019-10-19 DIAGNOSIS — L270 Generalized skin eruption due to drugs and medicaments taken internally: Secondary | ICD-10-CM | POA: Diagnosis present

## 2019-10-19 DIAGNOSIS — Z833 Family history of diabetes mellitus: Secondary | ICD-10-CM | POA: Diagnosis not present

## 2019-10-19 DIAGNOSIS — Z8249 Family history of ischemic heart disease and other diseases of the circulatory system: Secondary | ICD-10-CM | POA: Diagnosis not present

## 2019-10-19 DIAGNOSIS — E876 Hypokalemia: Secondary | ICD-10-CM | POA: Diagnosis not present

## 2019-10-19 DIAGNOSIS — I10 Essential (primary) hypertension: Secondary | ICD-10-CM | POA: Diagnosis present

## 2019-10-19 DIAGNOSIS — R269 Unspecified abnormalities of gait and mobility: Secondary | ICD-10-CM | POA: Diagnosis not present

## 2019-10-19 DIAGNOSIS — J440 Chronic obstructive pulmonary disease with acute lower respiratory infection: Secondary | ICD-10-CM | POA: Diagnosis present

## 2019-10-19 DIAGNOSIS — Z66 Do not resuscitate: Secondary | ICD-10-CM | POA: Diagnosis present

## 2019-10-19 DIAGNOSIS — R000 Tachycardia, unspecified: Secondary | ICD-10-CM | POA: Diagnosis not present

## 2019-10-19 DIAGNOSIS — B957 Other staphylococcus as the cause of diseases classified elsewhere: Secondary | ICD-10-CM | POA: Diagnosis present

## 2019-10-19 DIAGNOSIS — I5032 Chronic diastolic (congestive) heart failure: Secondary | ICD-10-CM | POA: Diagnosis present

## 2019-10-19 DIAGNOSIS — I480 Paroxysmal atrial fibrillation: Secondary | ICD-10-CM | POA: Diagnosis present

## 2019-10-19 DIAGNOSIS — Z87891 Personal history of nicotine dependence: Secondary | ICD-10-CM

## 2019-10-19 DIAGNOSIS — J850 Gangrene and necrosis of lung: Secondary | ICD-10-CM | POA: Diagnosis present

## 2019-10-19 DIAGNOSIS — B952 Enterococcus as the cause of diseases classified elsewhere: Secondary | ICD-10-CM | POA: Diagnosis present

## 2019-10-19 DIAGNOSIS — D630 Anemia in neoplastic disease: Secondary | ICD-10-CM | POA: Diagnosis present

## 2019-10-19 DIAGNOSIS — Z811 Family history of alcohol abuse and dependence: Secondary | ICD-10-CM | POA: Diagnosis not present

## 2019-10-19 DIAGNOSIS — J449 Chronic obstructive pulmonary disease, unspecified: Secondary | ICD-10-CM | POA: Diagnosis present

## 2019-10-19 DIAGNOSIS — Z20822 Contact with and (suspected) exposure to covid-19: Secondary | ICD-10-CM | POA: Diagnosis present

## 2019-10-19 DIAGNOSIS — D6959 Other secondary thrombocytopenia: Secondary | ICD-10-CM | POA: Diagnosis present

## 2019-10-19 DIAGNOSIS — Z79899 Other long term (current) drug therapy: Secondary | ICD-10-CM

## 2019-10-19 DIAGNOSIS — R0902 Hypoxemia: Secondary | ICD-10-CM | POA: Diagnosis not present

## 2019-10-19 DIAGNOSIS — I4891 Unspecified atrial fibrillation: Secondary | ICD-10-CM | POA: Diagnosis not present

## 2019-10-19 DIAGNOSIS — Z9981 Dependence on supplemental oxygen: Secondary | ICD-10-CM

## 2019-10-19 DIAGNOSIS — R739 Hyperglycemia, unspecified: Secondary | ICD-10-CM

## 2019-10-19 DIAGNOSIS — Z7984 Long term (current) use of oral hypoglycemic drugs: Secondary | ICD-10-CM

## 2019-10-19 DIAGNOSIS — R404 Transient alteration of awareness: Secondary | ICD-10-CM | POA: Diagnosis not present

## 2019-10-19 DIAGNOSIS — Z886 Allergy status to analgesic agent status: Secondary | ICD-10-CM

## 2019-10-19 DIAGNOSIS — D696 Thrombocytopenia, unspecified: Secondary | ICD-10-CM | POA: Diagnosis present

## 2019-10-19 HISTORY — DX: Malignant (primary) neoplasm, unspecified: C80.1

## 2019-10-19 LAB — PROTIME-INR
INR: 1 (ref 0.8–1.2)
Prothrombin Time: 13.1 seconds (ref 11.4–15.2)

## 2019-10-19 LAB — CBC WITH DIFFERENTIAL/PLATELET
Abs Immature Granulocytes: 0.09 10*3/uL — ABNORMAL HIGH (ref 0.00–0.07)
Basophils Absolute: 0 10*3/uL (ref 0.0–0.1)
Basophils Relative: 0 %
Eosinophils Absolute: 0 10*3/uL (ref 0.0–0.5)
Eosinophils Relative: 0 %
HCT: 27.4 % — ABNORMAL LOW (ref 39.0–52.0)
Hemoglobin: 8.4 g/dL — ABNORMAL LOW (ref 13.0–17.0)
Immature Granulocytes: 2 %
Lymphocytes Relative: 5 %
Lymphs Abs: 0.2 10*3/uL — ABNORMAL LOW (ref 0.7–4.0)
MCH: 29.8 pg (ref 26.0–34.0)
MCHC: 30.7 g/dL (ref 30.0–36.0)
MCV: 97.2 fL (ref 80.0–100.0)
Monocytes Absolute: 0.1 10*3/uL (ref 0.1–1.0)
Monocytes Relative: 3 %
Neutro Abs: 4.3 10*3/uL (ref 1.7–7.7)
Neutrophils Relative %: 90 %
Platelets: 99 10*3/uL — ABNORMAL LOW (ref 150–400)
RBC: 2.82 MIL/uL — ABNORMAL LOW (ref 4.22–5.81)
RDW: 17.8 % — ABNORMAL HIGH (ref 11.5–15.5)
WBC: 4.7 10*3/uL (ref 4.0–10.5)
nRBC: 0 % (ref 0.0–0.2)

## 2019-10-19 LAB — URINALYSIS, ROUTINE W REFLEX MICROSCOPIC
Bacteria, UA: NONE SEEN
Bilirubin Urine: NEGATIVE
Glucose, UA: 50 mg/dL — AB
Ketones, ur: NEGATIVE mg/dL
Leukocytes,Ua: NEGATIVE
Nitrite: NEGATIVE
Protein, ur: 30 mg/dL — AB
Specific Gravity, Urine: 1.018 (ref 1.005–1.030)
pH: 5 (ref 5.0–8.0)

## 2019-10-19 LAB — COMPREHENSIVE METABOLIC PANEL
ALT: 23 U/L (ref 0–44)
AST: 16 U/L (ref 15–41)
Albumin: 2.3 g/dL — ABNORMAL LOW (ref 3.5–5.0)
Alkaline Phosphatase: 57 U/L (ref 38–126)
Anion gap: 9 (ref 5–15)
BUN: 16 mg/dL (ref 8–23)
CO2: 28 mmol/L (ref 22–32)
Calcium: 7.7 mg/dL — ABNORMAL LOW (ref 8.9–10.3)
Chloride: 96 mmol/L — ABNORMAL LOW (ref 98–111)
Creatinine, Ser: 0.75 mg/dL (ref 0.61–1.24)
GFR calc Af Amer: >60 mL/min (ref >60)
GFR calc non Af Amer: >60 mL/min (ref >60)
Glucose, Bld: 161 mg/dL — ABNORMAL HIGH (ref 70–99)
Potassium: 4.4 mmol/L (ref 3.5–5.1)
Sodium: 133 mmol/L — ABNORMAL LOW (ref 135–145)
Total Bilirubin: 0.5 mg/dL (ref 0.3–1.2)
Total Protein: 6.8 g/dL (ref 6.5–8.1)

## 2019-10-19 LAB — LACTIC ACID, PLASMA
Lactic Acid, Venous: 1.4 mmol/L (ref 0.5–1.9)
Lactic Acid, Venous: 1 mmol/L (ref 0.5–1.9)

## 2019-10-19 LAB — SARS CORONAVIRUS 2 BY RT PCR (HOSPITAL ORDER, PERFORMED IN ~~LOC~~ HOSPITAL LAB): SARS Coronavirus 2: NEGATIVE

## 2019-10-19 LAB — APTT: aPTT: 34 seconds (ref 24–36)

## 2019-10-19 MED ORDER — LACTATED RINGERS IV BOLUS
1000.0000 mL | Freq: Once | INTRAVENOUS | Status: AC
  Administered 2019-10-19: 1000 mL via INTRAVENOUS

## 2019-10-19 MED ORDER — GABAPENTIN 300 MG PO CAPS
600.0000 mg | ORAL_CAPSULE | Freq: Every day | ORAL | Status: DC
  Administered 2019-10-19 – 2019-10-27 (×9): 600 mg via ORAL
  Filled 2019-10-19 (×9): qty 2

## 2019-10-19 MED ORDER — LORATADINE 10 MG PO TABS
10.0000 mg | ORAL_TABLET | Freq: Every day | ORAL | Status: DC
  Administered 2019-10-20 – 2019-10-28 (×9): 10 mg via ORAL
  Filled 2019-10-19 (×9): qty 1

## 2019-10-19 MED ORDER — IPRATROPIUM BROMIDE 0.02 % IN SOLN
0.5000 mg | Freq: Four times a day (QID) | RESPIRATORY_TRACT | Status: DC
  Administered 2019-10-19: 0.5 mg via RESPIRATORY_TRACT
  Filled 2019-10-19: qty 2.5

## 2019-10-19 MED ORDER — ASCORBIC ACID 500 MG PO TABS: 1000.0000 mg | ORAL_TABLET | Freq: Every day | ORAL | Status: DC

## 2019-10-19 MED ORDER — DOCUSATE SODIUM 100 MG PO CAPS
100.0000 mg | ORAL_CAPSULE | Freq: Two times a day (BID) | ORAL | Status: DC
  Administered 2019-10-19 – 2019-10-20 (×2): 100 mg via ORAL
  Filled 2019-10-19 (×2): qty 1

## 2019-10-19 MED ORDER — LACTATED RINGERS IV BOLUS (SEPSIS)
1000.0000 mL | Freq: Once | INTRAVENOUS | Status: AC
  Administered 2019-10-19: 1000 mL via INTRAVENOUS

## 2019-10-19 MED ORDER — METFORMIN HCL ER 500 MG PO TB24: 500.0000 mg | ORAL_TABLET | ORAL | Status: DC

## 2019-10-19 MED ORDER — FOLIC ACID 1 MG PO TABS
1.0000 mg | ORAL_TABLET | Freq: Every day | ORAL | Status: DC
  Administered 2019-10-20 – 2019-10-28 (×9): 1 mg via ORAL
  Filled 2019-10-19 (×9): qty 1

## 2019-10-19 MED ORDER — SODIUM CHLORIDE 0.9 % IV SOLN
2.0000 g | Freq: Three times a day (TID) | INTRAVENOUS | Status: DC
  Administered 2019-10-20 – 2019-10-22 (×8): 2 g via INTRAVENOUS
  Filled 2019-10-19 (×10): qty 2

## 2019-10-19 MED ORDER — PREDNISONE 20 MG PO TABS: 20.0000 mg | ORAL_TABLET | ORAL | Status: DC

## 2019-10-19 MED ORDER — INSULIN ASPART 100 UNIT/ML ~~LOC~~ SOLN
0.0000 [IU] | Freq: Three times a day (TID) | SUBCUTANEOUS | Status: DC
  Administered 2019-10-20: 2 [IU] via SUBCUTANEOUS
  Administered 2019-10-20 – 2019-10-21 (×2): 8 [IU] via SUBCUTANEOUS
  Administered 2019-10-21: 5 [IU] via SUBCUTANEOUS
  Administered 2019-10-21: 8 [IU] via SUBCUTANEOUS
  Administered 2019-10-22: 3 [IU] via SUBCUTANEOUS
  Administered 2019-10-22 – 2019-10-23 (×3): 2 [IU] via SUBCUTANEOUS
  Administered 2019-10-23 – 2019-10-25 (×4): 3 [IU] via SUBCUTANEOUS
  Administered 2019-10-26: 5 [IU] via SUBCUTANEOUS
  Administered 2019-10-26: 2 [IU] via SUBCUTANEOUS
  Administered 2019-10-27: 5 [IU] via SUBCUTANEOUS
  Administered 2019-10-27 – 2019-10-28 (×2): 2 [IU] via SUBCUTANEOUS
  Administered 2019-10-28: 5 [IU] via SUBCUTANEOUS

## 2019-10-19 MED ORDER — MELATONIN 3 MG PO TABS
5.0000 mg | ORAL_TABLET | Freq: Every day | ORAL | Status: DC
  Administered 2019-10-19 – 2019-10-27 (×9): 4.5 mg via ORAL
  Filled 2019-10-19 (×9): qty 2

## 2019-10-19 MED ORDER — PANTOPRAZOLE SODIUM 40 MG PO TBEC
40.0000 mg | DELAYED_RELEASE_TABLET | Freq: Every day | ORAL | Status: DC
  Administered 2019-10-20 – 2019-10-28 (×9): 40 mg via ORAL
  Filled 2019-10-19 (×9): qty 1

## 2019-10-19 MED ORDER — LIDOCAINE-PRILOCAINE 2.5-2.5 % EX CREA: TOPICAL_CREAM | CUTANEOUS | Status: DC | PRN

## 2019-10-19 MED ORDER — TRAMADOL HCL 50 MG PO TABS
50.0000 mg | ORAL_TABLET | ORAL | Status: DC | PRN
  Administered 2019-10-20 – 2019-10-28 (×13): 50 mg via ORAL
  Filled 2019-10-19 (×13): qty 1

## 2019-10-19 MED ORDER — VANCOMYCIN HCL IN DEXTROSE 1-5 GM/200ML-% IV SOLN
1000.0000 mg | Freq: Three times a day (TID) | INTRAVENOUS | Status: DC
  Administered 2019-10-20 – 2019-10-22 (×7): 1000 mg via INTRAVENOUS
  Filled 2019-10-19 (×7): qty 200

## 2019-10-19 MED ORDER — ASCORBIC ACID 500 MG PO TABS
1000.0000 mg | ORAL_TABLET | Freq: Every day | ORAL | Status: DC
  Administered 2019-10-20 – 2019-10-28 (×9): 1000 mg via ORAL
  Filled 2019-10-19 (×9): qty 2

## 2019-10-19 MED ORDER — SODIUM CHLORIDE 0.9 % IV SOLN
2.0000 g | Freq: Once | INTRAVENOUS | Status: AC
  Administered 2019-10-19: 2 g via INTRAVENOUS

## 2019-10-19 MED ORDER — FERROUS SULFATE 325 (65 FE) MG PO TABS
325.0000 mg | ORAL_TABLET | Freq: Every day | ORAL | Status: DC
  Administered 2019-10-20 – 2019-10-28 (×9): 325 mg via ORAL
  Filled 2019-10-19 (×9): qty 1

## 2019-10-19 MED ORDER — LEVALBUTEROL HCL 1.25 MG/0.5ML IN NEBU
1.2500 mg | INHALATION_SOLUTION | Freq: Four times a day (QID) | RESPIRATORY_TRACT | Status: DC
  Administered 2019-10-19: 1.25 mg via RESPIRATORY_TRACT
  Filled 2019-10-19: qty 0.5

## 2019-10-19 MED ORDER — VANCOMYCIN HCL IN DEXTROSE 1-5 GM/200ML-% IV SOLN
1000.0000 mg | Freq: Once | INTRAVENOUS | Status: AC
  Administered 2019-10-19: 1000 mg via INTRAVENOUS
  Filled 2019-10-19: qty 200

## 2019-10-19 MED ORDER — METFORMIN HCL ER 500 MG PO TB24
1000.0000 mg | ORAL_TABLET | Freq: Every day | ORAL | Status: DC
  Administered 2019-10-20 – 2019-10-28 (×9): 1000 mg via ORAL
  Filled 2019-10-19 (×9): qty 2

## 2019-10-19 MED ORDER — ACETAMINOPHEN 325 MG PO TABS
650.0000 mg | ORAL_TABLET | Freq: Four times a day (QID) | ORAL | Status: DC | PRN
  Administered 2019-10-19 – 2019-10-28 (×13): 650 mg via ORAL
  Filled 2019-10-19 (×13): qty 2

## 2019-10-19 MED ORDER — METFORMIN HCL ER 500 MG PO TB24
500.0000 mg | ORAL_TABLET | Freq: Every day | ORAL | Status: DC
  Administered 2019-10-20 – 2019-10-28 (×9): 500 mg via ORAL
  Filled 2019-10-19 (×9): qty 1

## 2019-10-19 MED ORDER — PREDNISONE 20 MG PO TABS
20.0000 mg | ORAL_TABLET | Freq: Every day | ORAL | Status: AC
  Administered 2019-10-20 – 2019-10-21 (×2): 20 mg via ORAL
  Filled 2019-10-19 (×2): qty 1

## 2019-10-19 MED ORDER — LACTATED RINGERS IV BOLUS (SEPSIS)
500.0000 mL | Freq: Once | INTRAVENOUS | Status: AC
  Administered 2019-10-19: 500 mL via INTRAVENOUS

## 2019-10-19 MED ORDER — DILTIAZEM HCL 30 MG PO TABS
30.0000 mg | ORAL_TABLET | Freq: Two times a day (BID) | ORAL | Status: DC
  Administered 2019-10-19 – 2019-10-20 (×2): 30 mg via ORAL
  Filled 2019-10-19 (×2): qty 1

## 2019-10-19 MED ORDER — METRONIDAZOLE IN NACL 5-0.79 MG/ML-% IV SOLN
500.0000 mg | Freq: Once | INTRAVENOUS | Status: AC
  Administered 2019-10-19: 500 mg via INTRAVENOUS
  Filled 2019-10-19: qty 100

## 2019-10-19 NOTE — ED Provider Notes (Signed)
[facility] EMERGENCY DEPARTMENT Provider Note   CSN: [id number] Arrival date & time: 10/19/19  1703     History Chief Complaint  Patient presents with  . Code Sepsis    Jeremy [name] is a 74 y.o. male.  HPI Patient was discharged from the hospital yesterday.  Reportedly has weakness in fever today.  Temperature reportedly 101 for EMS.  There is some urinary frequency.  Mild cough without much production.  Does have rash from previous [place] therapy.  Discharged yesterday.  States has been feeling worse since.  No nausea or vomiting.  Is on prednisone for the rash.    Past Medical History:  Diagnosis Date  . Cancer ([place])    mass right lung  . COPD (chronic obstructive pulmonary disease) ([facility])    QUIT SMOKING 30 YRS AGO  . Diabetes ([place]) 2018  . HTN (hypertension)   . Psoriatic arthritis ([place]) 2015    Patient Active Problem List   Diagnosis Date Noted  . Failure to thrive in adult 10/16/2019  . Generalized weakness 10/16/2019  . Drug-induced skin rash 09/23/2019  . 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[place]) 08/26/2019  . Encounter for antineoplastic chemotherapy 08/26/2019  . Encounter for antineoplastic immunotherapy 08/26/2019  . Goals of care, counseling/discussion 08/26/2019  . Atrial fibrillation with RVR ([place])   . Protein-calorie malnutrition, severe 08/07/2019  . Hemoptysis 08/06/2019  . Mass of right lung 08/06/2019  . Former smoker 08/06/2019  . Lung mass 08/06/2019  . Postobstructive pneumonia 08/06/2019  . COPD (chronic obstructive pulmonary disease) ([place])   . HTN (hypertension)   . Abnormal weight loss   . Leukocytosis   . Acute and chronic respiratory failure with hypoxia ([place])   . Anemia in neoplastic disease   . Hyperglycemia   . Heme + stool 11/06/2018  . Diabetes ([place]) 2018    Past Surgical History:  Procedure Laterality Date  . BACK SURGERY  1990  . BIOPSY  12/30/2018   Procedure: BIOPSY;  Surgeon: Danie Binder, MD;   Location[facility];  Service: Endoscopy;;  gastric  . CATARACT EXTRACTION Bilateral   . CERVICAL SPINE SURGERY  1995  . COLONOSCOPY WITH PROPOFOL N/A 12/30/2018   Procedure: COLONOSCOPY WITH PROPOFOL;  Surgeon: Danie Binder, MD;  Location: [facility];  Service: Endoscopy;  Laterality: N/A;  12:30pm  . ESOPHAGOGASTRODUODENOSCOPY (EGD) WITH PROPOFOL N/A 12/30/2018   Procedure: ESOPHAGOGASTRODUODENOSCOPY (EGD) WITH PROPOFOL;  Surgeon: Danie Binder, MD;  Location: [facility];  Service: Endoscopy;  Laterality: N/A;  . IR IMAGING GUIDED PORT INSERTION  09/04/2019  . POLYPECTOMY  12/30/2018   Procedure: POLYPECTOMY;  Surgeon: Danie Binder, MD;  Location: [facility];  Service: Endoscopy;;  colon  . THORACENTESIS Right 08/08/2019   Procedure: Thoracentesis;  Surgeon: [name], MD;  Location: [place] [facility];  Service: Pulmonary;  Laterality: Right;  Marland Kitchen VIDEO BRONCHOSCOPY Right 08/08/2019   Procedure: Video Bronchoscopy with Erbe Cryo Biopsy of Right mainstem;  Surgeon: [name], MD;  Location: [facility] OR;  Service: Pulmonary;  Laterality: Right;       Family History  Problem Relation Age of Onset  . Alcoholism Father   . CAD Brother   . Diabetes Brother   . Colon cancer Neg Hx   . Colon polyps Neg Hx   . Gastric cancer Neg Hx     Social History   Tobacco Use  . Smoking status: Former Smoker    Packs/day:  0.50    Years: 50.00    Pack years: 25.00    Types: Cigarettes    Quit date: 12/26/1991    Years since quitting: 27.8  . Smokeless tobacco: Never Used  Substance Use Topics  . Alcohol use: Yes    Comment: occ beer  . Drug use: Never    Home Medications Prior to Admission medications   Medication Sig Start Date End Date Taking? Authorizing Provider  acetaminophen (TYLENOL) 500 MG tablet Take 500 mg by mouth every 4 (four) hours as needed (for pain.).   Yes [provider]  albuterol (VENTOLIN HFA) 108 (90 Base) MCG/ACT inhaler Inhale 2 puffs into the lungs  every 6 (six) hours as needed for wheezing or shortness of breath.   Yes [provider]  Ascorbic Acid (VITAMIN C) 1000 MG tablet Take 1,000 mg by mouth daily.   Yes [provider]  diltiazem (CARDIZEM) 30 MG tablet Take 1 tablet (30 mg total) by mouth 2 (two) times daily. 09/19/19  Yes BranchAlphonse Guild, MD  docusate sodium (COLACE) 100 MG capsule Take 100 mg by mouth 2 (two) times daily.   Yes [provider]  ferrous sulfate 325 (65 FE) MG tablet Take 325 mg by mouth daily with supper.   Yes [provider]  FLOVENT HFA 110 MCG/ACT inhaler 1 puff 2 (two) times daily. 06/20/19  Yes [provider]  folic acid (FOLVITE) 1 MG tablet Take 1 mg by mouth daily.   Yes [provider]  gabapentin (NEURONTIN) 300 MG capsule Take 600 mg by mouth at bedtime.   Yes [provider]  lidocaine-prilocaine (EMLA) cream Apply to the Port-A-Cath site 30 minutes before treatment 08/26/19  Yes Curt Bears, MD  loratadine (CLARITIN) 10 MG tablet Take 10 mg by mouth daily.   Yes [provider]  metFORMIN (GLUCOPHAGE-XR) 500 MG 24 hr tablet Take 500-1,000 mg by mouth See admin instructions. Take 2 tablets (1000 mg) by mouth in the morning & 1 tablet (500 mg) by mouth at night. 10/15/18  Yes [provider]  Omega-3 Fatty Acids (FISH OIL) 1000 MG CAPS Take 1,000 mg by mouth daily.    Yes [provider]  pantoprazole (PROTONIX) 40 MG tablet 1 po 30 mins prior to first meal 12/30/18  Yes Fields, Sandi L, MD  predniSONE (DELTASONE) 20 MG tablet Please take 4 tablets (80 mg) daily for one week, followed by 3 tablets (60 mg) daily for 1 week, followed by 2 tablets (20 mg) for one week 09/23/19  Yes Heilingoetter, Cassandra L, PA-C  traMADol (ULTRAM) 50 MG tablet Take 50 mg by mouth every 4 (four) hours as needed. for pain 08/19/18  Yes [provider]  triamcinolone lotion (KENALOG) 0.1 % Apply 1 application topically 4 (four)  times daily. 09/15/19  Yes Tanner, Lyndon Code., PA-C  umeclidinium-vilanterol (ANORO ELLIPTA) 62.5-25 MCG/INH AEPB Inhale 1 puff into the lungs daily. 08/13/19  Yes Mariel Aloe, MD  doxycycline (MONODOX) 50 MG capsule Take 50 mg by mouth 2 (two) times daily as needed (rosacea flare ups.).    [provider]  metroNIDAZOLE (METROCREAM) 0.75 % cream Apply 1 application topically 2 (two) times daily as needed (facial rosacea).     [provider]  mometasone (ELOCON) 0.1 % cream Apply 1 application topically 2 (two) times daily as needed (psoriasis).     [provider]  prochlorperazine (COMPAZINE) 10 MG tablet Take 1 tablet (10 mg total) by mouth  every 6 (six) hours as needed for nausea or vomiting. 08/26/19   Curt Bears, MD    Allergies    Aleve [naproxen sodium]  Review of Systems   Review of Systems  Constitutional: Positive for fatigue and fever.  HENT: Negative for congestion.   Respiratory: Negative for shortness of breath.   Cardiovascular: Negative for chest pain.  Gastrointestinal: Negative for abdominal pain.  Genitourinary: Positive for frequency.  Musculoskeletal: Negative for back pain.  Skin: Positive for rash.  Neurological: Negative for weakness.  Psychiatric/Behavioral: Negative for confusion.    Physical Exam Updated Vital Signs BP (!) 144/77   Pulse (!) 114   Temp (!) 103.3 F (39.6 C) (Rectal)   Resp (!) 29   Ht 6\' 2"  (1.88 m)   Wt 81.6 kg   SpO2 96%   BMI 23.11 kg/m   Physical Exam Vitals reviewed.  HENT:     Head: Atraumatic.  Eyes:     Pupils: Pupils are equal, round, and reactive to light.  Cardiovascular:     Rate and Rhythm: Regular rhythm. Tachycardia present.  Pulmonary:     Breath sounds: No wheezing or rhonchi.  Abdominal:     Tenderness: There is no abdominal tenderness.  Skin:    General: Skin is warm.     Findings: Rash present.     Comments: Diffuse rash over extremities primarily.  Maculopapular.  No  clear infection.  Neurological:     Mental Status: He is alert and oriented to person, place, and time.     ED Results / Procedures / Treatments   Labs (all labs ordered are listed, but only abnormal results are displayed) Labs Reviewed  COMPREHENSIVE METABOLIC PANEL - Abnormal; Notable for the following components:      Result Value   Sodium 133 (*)    Chloride 96 (*)    Glucose, Bld 161 (*)    Calcium 7.7 (*)    Albumin 2.3 (*)    All other components within normal limits  CBC WITH DIFFERENTIAL/PLATELET - Abnormal; Notable for the following components:   RBC 2.82 (*)    Hemoglobin 8.4 (*)    HCT 27.4 (*)    RDW 17.8 (*)    Platelets 99 (*)    Lymphs Abs 0.2 (*)    Abs Immature Granulocytes 0.09 (*)    All other components within normal limits  URINALYSIS, ROUTINE W REFLEX MICROSCOPIC - Abnormal; Notable for the following components:   APPearance HAZY (*)    Glucose, UA 50 (*)    Hgb urine dipstick MODERATE (*)    Protein, ur 30 (*)    All other components within normal limits  CULTURE, BLOOD (ROUTINE X 2)  CULTURE, BLOOD (ROUTINE X 2)  URINE CULTURE  LACTIC ACID, PLASMA  PROTIME-INR  APTT  LACTIC ACID, PLASMA    EKG None  Radiology DG Chest Port 1 View  Result Date: 10/19/2019 CLINICAL DATA:  Fever and urinary complaints EXAM: PORTABLE CHEST 1 VIEW COMPARISON:  Radiograph 10/16/2019 FINDINGS: Likely increasing size of the complex right pleural effusion with associated passive atelectasis though some underlying consolidation is not excluded. A necrotic lung mass is again seen in the right middle lobe, better visualized on cross-sectional imaging. Left lung is predominantly clear aside from basilar atelectasis. Left apical scarring as well. Cardiomediastinal contours are unchanged from prior, partially obscured in the right lung base by overlying opacity. Right IJ Port-A-Cath tip terminates at the superior cavoatrial junction. Telemetry leads overlie the chest.  Degenerative changes are present in the imaged spine and shoulders. IMPRESSION: 1. Likely increasing size of the complex right pleural effusion with associated passive atelectasis though some underlying consolidation is not excluded in the setting of fever. 2. Necrotic right lung mass, better visualized on cross-sectional imaging. Electronically Signed   By: Lovena Le M.D.   On: 10/19/2019 17:39   DG HIP UNILAT WITH PELVIS 2-3 VIEWS RIGHT  Result Date: 10/18/2019 CLINICAL DATA:  Right hip pain for 5 days following fall, initial encounter EXAM: DG HIP (WITH OR WITHOUT PELVIS) 2-3V RIGHT COMPARISON:  None. 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 No acute fracture or dislocation is seen. No soft tissue abnormality is noted. IMPRESSION: Degenerative change without acute abnormality. Electronically Signed   By: Inez Catalina M.D.   On: 10/18/2019 12:21    Procedures Procedures (including critical care time)  Medications Ordered in ED Medications  metroNIDAZOLE (FLAGYL) IVPB 500 mg (500 mg Intravenous New Bag/Given 10/19/19 1833)  vancomycin (VANCOCIN) IVPB 1000 mg/200 mL premix (has no administration in time range)  vancomycin (VANCOCIN) IVPB 1000 mg/200 mL premix (has no administration in time range)  ceFEPIme (MAXIPIME) 2 g in sodium chloride 0.9 % 100 mL IVPB (has no administration in time range)  lactated ringers bolus 1,000 mL (1,000 mLs Intravenous New Bag/Given 10/19/19 1734)  lactated ringers bolus 1,000 mL (1,000 mLs Intravenous New Bag/Given 10/19/19 1754)    And  lactated ringers bolus 500 mL (0 mLs Intravenous Stopped 10/19/19 1828)  ceFEPIme (MAXIPIME) 2 g in sodium chloride 0.9 % 100 mL IVPB (0 g Intravenous Stopped 10/19/19 1831)    ED Course  I have reviewed the triage vital signs and the nursing notes.  Pertinent labs & imaging results that were available during my care of the patient were reviewed by me and considered in my medical decision making  (see chart for details).    MDM Rules/Calculators/A&P                      Patient presents with weakness and fever.  Discharge from hospital yesterday.  Initial question of urinary tract infection but urine does not show infection.  Has known lung cancer with effusion on the right.  Tachycardic.  Once fever came back code sepsis called.  Labs overall reassuring however.  Normal lactic acid.  I think most likely the source is either pulmonary or intrathoracic with the effusion.  Is on prednisone also for skin rash after chemotherapy.  Also potentially could have been a nidus for infection although skin rash is improving.  Will admit to hospitalist.-   CRITICAL CARE Performed by: Davonna Belling Total critical care time: 30 minutes Critical care time was exclusive of separately billable procedures and treating other patients. Critical care was necessary to treat or prevent imminent or life-threatening deterioration. Critical care was time spent personally by me on the following activities: development of treatment plan with patient and/or surrogate as well as nursing, discussions with consultants, evaluation of patient's response to treatment, examination of patient, obtaining history from patient or surrogate, ordering and performing treatments and interventions, ordering and review of laboratory studies, ordering and review of radiographic studies, pulse oximetry and re-evaluation of patient's condition.  Final Clinical Impression(s) / ED Diagnoses Final diagnoses:  Sepsis without acute organ dysfunction, due to unspecified organism [facility])  Malignant neoplasm of lung, unspecified laterality, unspecified part of lung ([place])    Rx / DC Orders ED Discharge Orders  None       Davonna Belling, MD 10/19/19 [id number]

## 2019-10-19 NOTE — ED Triage Notes (Signed)
Pt brought in by EMS due to fever and urinary complaints since this morning. Marland Kitchen HR enroute 124, temp 101 noted to have some confusion. Reported to have increased frequency

## 2019-10-19 NOTE — ED Notes (Signed)
ED TO INPATIENT HANDOFF REPORT  ED Nurse Name and Phone #: [phone]  S Name/Age/Gender Haze Rushing 74 y.o. male Room/Bed: APA05/APA05  Code Status   Code Status: Prior  Home/SNF/Other Home Patient oriented to: self, place, time and situation Is this baseline? Yes   Triage Complete: Triage complete  Chief Complaint Sepsis due to undetermined organism [facility]) [A41.9]  Triage Note Pt brought in by EMS due to fever and urinary complaints since this morning. Marland Kitchen HR enroute 124, temp 101 noted to have some confusion. Reported to have increased frequency    Allergies Allergies  Allergen Reactions  . Aleve [Naproxen Sodium] Hives and Rash    Level of Care/Admitting Diagnosis ED Disposition    ED Disposition Condition [facility] Area: [facility] [[id number]]  Level of Care: Telemetry [5]  Covid Evaluation: Asymptomatic Screening Protocol (No Symptoms)  Diagnosis: Sepsis due to undetermined organism [facility]) [[id number]]  Admitting Physician: [name] [[id number]]  Attending Physician: [name] [[id number]]  Estimated length of stay: past midnight tomorrow  Certification:: I certify this patient will need inpatient services for at least 2 midnights       B Medical/Surgery History Past Medical History:  Diagnosis Date  . Cancer ([place])    mass right lung  . COPD (chronic obstructive pulmonary disease) ([facility])    QUIT SMOKING 30 YRS AGO  . Diabetes ([place]) 2018  . HTN (hypertension)   . Psoriatic arthritis ([place]) 2015   Past Surgical History:  Procedure Laterality Date  . BACK SURGERY  1990  . BIOPSY  12/30/2018   Procedure: BIOPSY;  Surgeon: Danie Binder, MD;  [facility];  Service: Endoscopy;;  gastric  . CATARACT EXTRACTION Bilateral   . CERVICAL SPINE SURGERY  1995  . COLONOSCOPY WITH PROPOFOL N/A 12/30/2018   Procedure: COLONOSCOPY WITH PROPOFOL;  Surgeon: Danie Binder, MD;  [facility];  Service: Endoscopy;   Laterality: N/A;  12:30pm  . ESOPHAGOGASTRODUODENOSCOPY (EGD) WITH PROPOFOL N/A 12/30/2018   Procedure: ESOPHAGOGASTRODUODENOSCOPY (EGD) WITH PROPOFOL;  Surgeon: Danie Binder, MD;  [facility];  Service: Endoscopy;  Laterality: N/A;  . IR IMAGING GUIDED PORT INSERTION  09/04/2019  . POLYPECTOMY  12/30/2018   Procedure: POLYPECTOMY;  Surgeon: Danie Binder, MD;  [facility];  Service: Endoscopy;;  colon  . THORACENTESIS Right 08/08/2019   Procedure: Thoracentesis;  Surgeon: [name], MD;  Location: [place];  Service: Pulmonary;  Laterality: Right;  Marland Kitchen VIDEO BRONCHOSCOPY Right 08/08/2019   Procedure: Video Bronchoscopy with Erbe Cryo Biopsy of Right mainstem;  Surgeon: [name], MD;  Location: [facility] OR;  Service: Pulmonary;  Laterality: Right;     A IV Location/Drains/Wounds Patient Lines/Drains/Airways Status   Active Line/Drains/Airways    Name:   Placement date:   Placement time:   Site:   Days:   Implanted Port 09/04/19 Right Chest   09/04/19    1503    Chest   45   Peripheral IV 10/19/19 Right Antecubital   10/19/19    1716    Antecubital   less than 1   Peripheral IV 10/19/19 Left Antecubital   10/19/19    1725    Antecubital   less than 1          Intake/Output Last 24 hours  Intake/Output Summary (Last 24 hours) at 10/19/2019 1919 Last data filed at 10/19/2019 1831 Gross per 24 hour  Intake 1099.76  ml  Output --  Net 1099.76 ml    Labs/Imaging Results for orders placed or performed during the hospital encounter of 10/19/19 (from the past 48 hour(s))  Urinalysis, Routine w reflex microscopic     Status: Abnormal   Collection Time: 10/19/19  5:10 PM  Result Value Ref Range   Color, Urine YELLOW YELLOW   APPearance HAZY (A) CLEAR   Specific Gravity, Urine 1.018 1.005 - 1.030   pH 5.0 5.0 - 8.0   Glucose, UA 50 (A) NEGATIVE mg/dL   Hgb urine dipstick MODERATE (A) NEGATIVE   Bilirubin Urine NEGATIVE NEGATIVE   Ketones, ur NEGATIVE NEGATIVE  mg/dL   Protein, ur 30 (A) NEGATIVE mg/dL   Nitrite NEGATIVE NEGATIVE   Leukocytes,Ua NEGATIVE NEGATIVE   RBC / HPF 11-20 0 - 5 RBC/hpf   WBC, UA 0-5 0 - 5 WBC/hpf   Bacteria, UA NONE SEEN NONE SEEN   Squamous Epithelial / LPF 0-5 0 - 5   Mucus PRESENT     Comment: Performed at [facility], [address] [place], [place] [address]  Comprehensive metabolic panel     Status: Abnormal   Collection Time: 10/19/19  5:20 PM  Result Value Ref Range   Sodium 133 (L) 135 - 145 mmol/L   Potassium 4.4 3.5 - 5.1 mmol/L   Chloride 96 (L) 98 - 111 mmol/L   CO2 28 22 - 32 mmol/L   Glucose, Bld 161 (H) 70 - 99 mg/dL    Comment: Glucose reference range applies only to samples taken after fasting for at least 8 hours.   BUN 16 8 - 23 mg/dL   Creatinine, Ser 0.75 0.61 - 1.24 mg/dL   Calcium 7.7 (L) 8.9 - 10.3 mg/dL   Total Protein 6.8 6.5 - 8.1 g/dL   Albumin 2.3 (L) 3.5 - 5.0 g/dL   AST 16 15 - 41 U/L   ALT 23 0 - 44 U/L   Alkaline Phosphatase 57 38 - 126 U/L   Total Bilirubin 0.5 0.3 - 1.2 mg/dL   GFR calc non Af Amer >60 >60 mL/min   GFR calc Af Amer >60 >60 mL/min   Anion gap 9 5 - 15    Comment: Performed at [facility], [address], [place], [place] [address]  CBC with Differential     Status: Abnormal   Collection Time: 10/19/19  5:20 PM  Result Value Ref Range   WBC 4.7 4.0 - 10.5 K/uL   RBC 2.82 (L) 4.22 - 5.81 MIL/uL   Hemoglobin 8.4 (L) 13.0 - 17.0 g/dL   HCT 27.4 (L) 39.0 - 52.0 %   MCV 97.2 80.0 - 100.0 fL   MCH 29.8 26.0 - 34.0 pg   MCHC 30.7 30.0 - 36.0 g/dL   RDW 17.8 (H) 11.5 - 15.5 %   Platelets 99 (L) 150 - 400 K/uL    Comment: SPECIMEN CHECKED FOR CLOTS   nRBC 0.0 0.0 - 0.2 %   Neutrophils Relative % 90 %   Neutro Abs 4.3 1.7 - 7.7 K/uL   Lymphocytes Relative 5 %   Lymphs Abs 0.2 (L) 0.7 - 4.0 K/uL   Monocytes Relative 3 %   Monocytes Absolute 0.1 0.1 - 1.0 K/uL   Eosinophils Relative 0 %   Eosinophils Absolute 0.0 0.0 - 0.5 K/uL   Basophils Relative 0 %    Basophils Absolute 0.0 0.0 - 0.1 K/uL   Immature Granulocytes 2 %   Abs Immature Granulocytes 0.09 (H)  0.00 - 0.07 K/uL    Comment: Performed at [facility], [address], [place], [place] [address]  Protime-INR     Status: None   Collection Time: 10/19/19  5:20 PM  Result Value Ref Range   Prothrombin Time 13.1 11.4 - 15.2 seconds   INR 1.0 0.8 - 1.2    Comment: (NOTE) INR goal varies based on device and disease states. Performed at [facility], [address], [place], [place] [address]   Lactic acid, plasma     Status: None   Collection Time: 10/19/19  5:22 PM  Result Value Ref Range   Lactic Acid, Venous 1.4 0.5 - 1.9 mmol/L    Comment: Performed at [facility], [address] [place], [place] [address]  Culture, blood (Routine x 2)     Status: None (Preliminary result)   Collection Time: 10/19/19  5:25 PM   Specimen: BLOOD LEFT ARM  Result Value Ref Range   Specimen Description BLOOD LEFT ARM    Special Requests      BOTTLES DRAWN AEROBIC AND ANAEROBIC Blood Culture adequate volume Performed at [facility], [address] [place],  [address]    Culture PENDING    Report Status PENDING   APTT     Status: None   Collection Time: 10/19/19  5:50 PM  Result Value Ref Range   aPTT 34 24 - 36 seconds    Comment: Performed at [facility], [address] [place],  [address]   DG Chest Port 1 View  Result Date: 10/19/2019 CLINICAL DATA:  Fever and urinary complaints EXAM: PORTABLE CHEST 1 VIEW COMPARISON:  Radiograph 10/16/2019 FINDINGS: Likely increasing size of the complex right pleural effusion with associated passive atelectasis though some underlying consolidation is not excluded. A necrotic lung mass is again seen in the right middle lobe, better visualized on cross-sectional imaging. Left lung is predominantly clear aside from basilar atelectasis. Left apical scarring as well. Cardiomediastinal contours are unchanged from prior, partially obscured in the  right lung base by overlying opacity. Right IJ Port-A-Cath tip terminates at the superior cavoatrial junction. Telemetry leads overlie the chest. Degenerative changes are present in the imaged spine and shoulders. IMPRESSION: 1. Likely increasing size of the complex right pleural effusion with associated passive atelectasis though some underlying consolidation is not excluded in the setting of fever. 2. Necrotic right lung mass, better visualized on cross-sectional imaging. Electronically Signed   By: Lovena Le M.D.   On: 10/19/2019 17:39   DG HIP UNILAT WITH PELVIS 2-3 VIEWS RIGHT  Result Date: 10/18/2019 CLINICAL DATA:  Right hip pain for 5 days following fall, initial encounter EXAM: DG HIP (WITH OR WITHOUT PELVIS) 2-3V RIGHT COMPARISON:  None. 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 No acute fracture or dislocation is seen. No soft tissue abnormality is noted. IMPRESSION: Degenerative change without acute abnormality. Electronically Signed   By: Inez Catalina M.D.   On: 10/18/2019 12:21    Pending Labs Unresulted Labs (From admission, onward)    Start     Ordered   10/19/19 1741  Urine culture  ONCE - STAT,   STAT     10/19/19 1741   10/19/19 1715  Lactic acid, plasma  Now then every 2 hours,   STAT     10/19/19 1714   10/19/19 1715  Culture, blood (Routine x 2)  BLOOD CULTURE X 2,   STAT     10/19/19 1714  Vitals/Pain Today's Vitals   10/19/19 1730 10/19/19 1734 10/19/19 1800 10/19/19 1830  BP: (!) 142/85  (!) 145/78 (!) 144/77  Pulse: (!) 121  (!) 117 (!) 114  Resp: (!) 24  20 (!) 29  Temp:  (!) 103.3 F (39.6 C)    TempSrc:  Rectal    SpO2: 96%  97% 96%  Weight:      Height:      PainSc:        Isolation Precautions No active isolations  Medications Medications  metroNIDAZOLE (FLAGYL) IVPB 500 mg (500 mg Intravenous New Bag/Given 10/19/19 1833)  vancomycin (VANCOCIN) IVPB 1000 mg/200 mL premix (has no administration in time  range)  vancomycin (VANCOCIN) IVPB 1000 mg/200 mL premix (has no administration in time range)  ceFEPIme (MAXIPIME) 2 g in sodium chloride 0.9 % 100 mL IVPB (has no administration in time range)  acetaminophen (TYLENOL) tablet 650 mg (has no administration in time range)  lactated ringers bolus 1,000 mL (1,000 mLs Intravenous New Bag/Given 10/19/19 1734)  lactated ringers bolus 1,000 mL (1,000 mLs Intravenous New Bag/Given 10/19/19 1754)    And  lactated ringers bolus 500 mL (0 mLs Intravenous Stopped 10/19/19 1828)  ceFEPIme (MAXIPIME) 2 g in sodium chloride 0.9 % 100 mL IVPB (0 g Intravenous Stopped 10/19/19 1831)    Mobility walks High fall risk   Focused Assessments    R Recommendations: See Admitting Provider Note  Report given to:   Additional Notes:

## 2019-10-19 NOTE — H&P (Signed)
History and Physical    PIKE SCANTLEBURY DPO:[id number] DOB: 12-31-1945 DOA: 10/19/2019  PCP: [name], MD   Patient coming from: Home.  I have personally briefly reviewed patient's old medical records in [place]  Chief Complaint: Fever and frequency.  HPI: Jeremy [name] is a 74 y.o. male with medical history significant of right lung cancer, COPD, type 2 diabetes, hypertension, psoriatic arthritis  who was discharged from [facility] after being admitted from 10/15/2019 until 10/17/2019 due to generalized weakness and fall.  He is coming today to [facility] due to fever and frequency without hematuria.  He has also have mild confusion.  He denies headache, sore throat, rhinorrhea, hemoptysis, but has occasional wheezing and very frequent productive cough of whitish sputum since he was diagnosed with the lung mass.  He denies chest pain, palpitations, dizziness, diaphoresis, PND, orthopnea or pitting edema lower extremities.  No he had just undergone his third cycle of palliative chemotherapy on 10/14/2019.  He has a generalized macular papular rash due to [facility], which has been discontinued.  ED Course: Initial vital signs were temperature 103.3 F, pulse 120, respirations 31, blood pressure 149/88 mmHg and O2 sat 96% on room air.  Blood cultures x2 were drawn.  The patient was given 2500 mL of LR bolus, cefepime, metronidazole and vancomycin.  Serum analysis showed a hazy appearance, glucosuria of 50 and proteinuria 30 mg/dL.  There was moderate hemoglobinuria.  The rest of the values are unremarkable.  CBC shows a white count of 4.7, hemoglobin 8.4 g/dL and platelets 99.  PT was 13.1, APTT 34 and INR 1.0.  Lactic acid was 1.4 and then 1.0 mmol/L.  Sodium 133, potassium 4.4, chloride 96 and CO2 28 mmol/L.  Renal function was normal.  Glucose 161 mg/dL and albumin 2.3 g/dL.  The rest of the values are within normal range when calcium is corrected to albumin.  Imaging:  Chest radiograph shows trace in size of complex of right pleural effusion with associated passive atelectasis though some underlying consolidation is not excluded in the setting of fever.  Necrotic right lung mass, better visualized on cross-sectional view.  Please see images and full radiology report for further detail.  Review of Systems: As per HPI otherwise all other systems reviewed and are negative.  Past Medical History:  Diagnosis Date  . Cancer ([place])    mass right lung  . COPD (chronic obstructive pulmonary disease) (HCC)    QUIT SMOKING 30 YRS AGO  . Diabetes ([place]) 2018  . HTN (hypertension)   . Psoriatic arthritis ([place]) 2015   Past Surgical History:  Procedure Laterality Date  . BACK SURGERY  1990  . BIOPSY  12/30/2018   Procedure: BIOPSY;  Surgeon: Danie Binder, MD;  [facility];  Service: Endoscopy;;  gastric  . CATARACT EXTRACTION Bilateral   . CERVICAL SPINE SURGERY  1995  . COLONOSCOPY WITH PROPOFOL N/A 12/30/2018   Procedure: COLONOSCOPY WITH PROPOFOL;  Surgeon: Danie Binder, MD;  Location: [facility];  Service: Endoscopy;  Laterality: N/A;  12:30pm  . ESOPHAGOGASTRODUODENOSCOPY (EGD) WITH PROPOFOL N/A 12/30/2018   Procedure: ESOPHAGOGASTRODUODENOSCOPY (EGD) WITH PROPOFOL;  Surgeon: Danie Binder, MD;  [facility];  Service: Endoscopy;  Laterality: N/A;  . IR IMAGING GUIDED PORT INSERTION  09/04/2019  . POLYPECTOMY  12/30/2018   Procedure: POLYPECTOMY;  Surgeon: Danie Binder, MD;  Location[facility];  Service: Endoscopy;;  colon  .  THORACENTESIS Right 08/08/2019   Procedure: Thoracentesis;  Surgeon: [name], MD;  Location: [facility];  Service: Pulmonary;  Laterality: Right;  Marland Kitchen VIDEO BRONCHOSCOPY Right 08/08/2019   Procedure: Video Bronchoscopy with Erbe Cryo Biopsy of Right mainstem;  Surgeon: [name], MD;  Location: [facility] OR;  Service: Pulmonary;  Laterality: Right;   Social History  reports that he quit smoking about 27  years ago. His smoking use included cigarettes. He has a 25.00 pack-year smoking history. He has never used smokeless tobacco. He reports current alcohol use. He reports that he does not use drugs.  Allergies  Allergen Reactions  . Aleve [Naproxen Sodium] Hives and Rash   Family History  Problem Relation Age of Onset  . Alcoholism Father   . CAD Brother   . Diabetes Brother   . Colon cancer Neg Hx   . Colon polyps Neg Hx   . Gastric cancer Neg Hx    Prior to Admission medications   Medication Sig Start Date End Date Taking? Authorizing Provider  acetaminophen (TYLENOL) 500 MG tablet Take 500 mg by mouth every 4 (four) hours as needed (for pain.).   Yes [provider]  albuterol (VENTOLIN HFA) 108 (90 Base) MCG/ACT inhaler Inhale 2 puffs into the lungs every 6 (six) hours as needed for wheezing or shortness of breath.   Yes [provider]  Ascorbic Acid (VITAMIN C) 1000 MG tablet Take 1,000 mg by mouth daily.   Yes [provider]  diltiazem (CARDIZEM) 30 MG tablet Take 1 tablet (30 mg total) by mouth 2 (two) times daily. 09/19/19  Yes BranchAlphonse Guild, MD  docusate sodium (COLACE) 100 MG capsule Take 100 mg by mouth 2 (two) times daily.   Yes [provider]  ferrous sulfate 325 (65 FE) MG tablet Take 325 mg by mouth daily with supper.   Yes [provider]  FLOVENT HFA 110 MCG/ACT inhaler 1 puff 2 (two) times daily. 06/20/19  Yes [provider]  folic acid (FOLVITE) 1 MG tablet Take 1 mg by mouth daily.   Yes [provider]  gabapentin (NEURONTIN) 300 MG capsule Take 600 mg by mouth at bedtime.   Yes [provider]  lidocaine-prilocaine (EMLA) cream Apply to the Port-A-Cath site 30 minutes before treatment 08/26/19  Yes Curt Bears, MD  loratadine (CLARITIN) 10 MG tablet Take 10 mg by mouth daily.   Yes [provider]  metFORMIN (GLUCOPHAGE-XR) 500 MG 24 hr tablet Take 500-1,000 mg by mouth See  admin instructions. Take 2 tablets (1000 mg) by mouth in the morning & 1 tablet (500 mg) by mouth at night. 10/15/18  Yes [provider]  Omega-3 Fatty Acids (FISH OIL) 1000 MG CAPS Take 1,000 mg by mouth daily.    Yes [provider]  pantoprazole (PROTONIX) 40 MG tablet 1 po 30 mins prior to first meal 12/30/18  Yes Fields, Sandi L, MD  predniSONE (DELTASONE) 20 MG tablet Please take 4 tablets (80 mg) daily for one week, followed by 3 tablets (60 mg) daily for 1 week, followed by 2 tablets (20 mg) for one week 09/23/19  Yes Heilingoetter, Cassandra L, PA-C  traMADol (ULTRAM) 50 MG tablet Take 50 mg by mouth every 4 (four) hours as needed. for pain 08/19/18  Yes [provider]  triamcinolone lotion (KENALOG) 0.1 % Apply 1 application topically 4 (four) times daily. 09/15/19  Yes Tanner, Lyndon Code., PA-C  umeclidinium-vilanterol (ANORO ELLIPTA) 62.5-25 MCG/INH AEPB Inhale  1 puff into the lungs daily. 08/13/19  Yes Mariel Aloe, MD  doxycycline (MONODOX) 50 MG capsule Take 50 mg by mouth 2 (two) times daily as needed (rosacea flare ups.).    [provider]  metroNIDAZOLE (METROCREAM) 0.75 % cream Apply 1 application topically 2 (two) times daily as needed (facial rosacea).     [provider]  mometasone (ELOCON) 0.1 % cream Apply 1 application topically 2 (two) times daily as needed (psoriasis).     [provider]  prochlorperazine (COMPAZINE) 10 MG tablet Take 1 tablet (10 mg total) by mouth every 6 (six) hours as needed for nausea or vomiting. 08/26/19   Curt Bears, MD    Physical Exam: Vitals:   10/19/19 1800 10/19/19 1830 10/19/19 1900 10/19/19 1930  BP: (!) 145/78 (!) 144/77 (!) 143/75 (!) 146/85  Pulse: (!) 117 (!) 114  (!) 109  Resp: 20 (!) 29 (!) 30 (!) 25  Temp:      TempSrc:      SpO2: 97% 96%  97%  Weight:      Height:       Constitutional: NAD, calm, comfortable Eyes: PERRL, lids and conjunctivae normal ENMT: Mucous  membranes are mildly dry. Posterior pharynx clear of any exudate or lesions. Neck: normal, supple, no masses, no thyromegaly Respiratory: Decreased breath sounds on both bases right greater than left with coarse rhonchi and mild bilateral wheezing, no crackles. No accessory muscle use.  Cardiovascular: Tachycardic at 114 bpm, no murmurs / rubs / gallops. No extremity edema. 2+ pedal pulses. No carotid bruits.  Abdomen: Nondistended.  Soft, no tenderness, no masses palpated. No hepatosplenomegaly. Bowel sounds positive.  Musculoskeletal: no clubbing / cyanosis.  Good ROM, no contractures. Normal muscle tone.  Skin: Extensive erythematous, macular papular rash. Neurologic: CN 2-12 grossly intact. Sensation intact, DTR normal. Strength 5/5 in all 4.  Psychiatric: Normal judgment and insight. Alert and oriented x 3. Normal mood.   Labs on Admission: I have personally reviewed following labs and imaging studies  CBC: Recent Labs  Lab 10/14/19 0928 10/16/19 0403 10/17/19 0531 10/18/19 0404 10/19/19 1720  WBC 13.6* 10.3 5.5 5.5 4.7  NEUTROABS 12.9* 10.0*  --   --  4.3  HGB 8.5* 8.3* 8.1* 8.0* 8.4*  HCT 27.6* 27.1* 26.5* 25.8* 27.4*  MCV 95.8 96.4 95.3 94.9 97.2  PLT 197 168 121* 131* 99*   Basic Metabolic Panel: Recent Labs  Lab 10/14/19 0928 10/16/19 0403 10/17/19 0531 10/18/19 0404 10/19/19 1720  NA 139 139 131* 132* 133*  K 4.0 3.8 3.4* 3.5 4.4  CL 102 98 94* 97* 96*  CO2 25 30 28 27 28   GLUCOSE 280* 183* 114* 110* 161*  BUN 24* 24* 16 15 16   CREATININE 0.86 0.71 0.59* 0.54* 0.75  CALCIUM 8.9 8.0* 7.6* 7.6* 7.7*  MG  --   --  1.6*  --   --    GFR: Estimated Creatinine Clearance: 92.1 mL/min (by C-G formula based on SCr of 0.75 mg/dL).  Liver Function Tests: Recent Labs  Lab 10/14/19 0928 10/16/19 0403 10/17/19 0531 10/19/19 1720  AST 9* 12* 19 16  ALT 17 15 18 23   ALKPHOS 87 66 60 57  BILITOT 0.3 0.6 0.6 0.5  PROT 7.3 6.8 6.4* 6.8  ALBUMIN 2.1* 2.4* 2.2* 2.3*     Urine analysis:    Component Value Date/Time   COLORURINE YELLOW 10/19/2019 1710   APPEARANCEUR HAZY (A) 10/19/2019 1710   LABSPEC 1.018 10/19/2019 1710  PHURINE 5.0 10/19/2019 1710   GLUCOSEU 50 (A) 10/19/2019 1710   HGBUR MODERATE (A) 10/19/2019 1710   BILIRUBINUR NEGATIVE 10/19/2019 1710   KETONESUR NEGATIVE 10/19/2019 1710   PROTEINUR 30 (A) 10/19/2019 1710   NITRITE NEGATIVE 10/19/2019 1710   LEUKOCYTESUR NEGATIVE 10/19/2019 1710   Radiological Exams on Admission: DG Chest Port 1 View  Result Date: 10/19/2019 CLINICAL DATA:  Fever and urinary complaints EXAM: PORTABLE CHEST 1 VIEW COMPARISON:  Radiograph 10/16/2019 FINDINGS: Likely increasing size of the complex right pleural effusion with associated passive atelectasis though some underlying consolidation is not excluded. A necrotic lung mass is again seen in the right middle lobe, better visualized on cross-sectional imaging. Left lung is predominantly clear aside from basilar atelectasis. Left apical scarring as well. Cardiomediastinal contours are unchanged from prior, partially obscured in the right lung base by overlying opacity. Right IJ Port-A-Cath tip terminates at the superior cavoatrial junction. Telemetry leads overlie the chest. Degenerative changes are present in the imaged spine and shoulders. IMPRESSION: 1. Likely increasing size of the complex right pleural effusion with associated passive atelectasis though some underlying consolidation is not excluded in the setting of fever. 2. Necrotic right lung mass, better visualized on cross-sectional imaging. Electronically Signed   By: Lovena Le M.D.   On: 10/19/2019 17:39   DG HIP UNILAT WITH PELVIS 2-3 VIEWS RIGHT  Result Date: 10/18/2019 CLINICAL DATA:  Right hip pain for 5 days following fall, initial encounter EXAM: DG HIP (WITH OR WITHOUT PELVIS) 2-3V RIGHT COMPARISON:  None. 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 No acute fracture  or dislocation is seen. No soft tissue abnormality is noted. IMPRESSION: Degenerative change without acute abnormality. Electronically Signed   By: Inez Catalina M.D.   On: 10/18/2019 12:21    EKG: Independently reviewed.  Vent. rate 120 BPM PR interval * ms QRS duration 90 ms QT/QTc 310/438 ms P-R-T axes 33 77 31 Sinus tachycardia  Assessment/Plan Principal Problem:   Sepsis due to undetermined organism (present on admission)(HCC) Source likely is pneumonia Admit to telemetry/inpatient. Continue supplemental oxygen. Xopenex every 6 hours. Ipratropium every 6 hours. Continue cefepime, metronidazole and vancomycin. Follow-up blood culture and sensitivity.  Active Problems:   COPD (chronic obstructive pulmonary disease) (HCC) Supplemental oxygen as needed. Bronchodilators as needed.    Type 2 diabetes mellitus (HCC) Carbohydrate modified diet. Metformin 100 mg p.o. in the morning. Metformin 500 mg p.o. in the evening. CBG monitoring with RI SS.    HTN (hypertension) Continue Cardizem 30 mg p.o. twice daily. Monitor blood pressure and heart rate.    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[place]) Continue treatment and follow-up per oncology.    Anemia in neoplastic disease Monitor H&H.    Thrombocytopenia (HCC) Monitor platelet count.   DVT prophylaxis: SCDs. Code Status:   DNR. Family Communication:  His wife was present in the ED room. Disposition Plan:   Patient is from:  Home.  Anticipated DC to:  Home.  Anticipated DC date:  10/22/2019.  Anticipated DC barriers: Clinical improvement.  Consults called: Admission status:  Inpatient/telemetry.   Severity of Illness: Moderate severity.  [name] MD [facility]  How to contact the [facility] Attending or Consulting provider [place] or covering provider during after hours [place], for this patient?   1. Check the care team in [facility] and look for a) attending/consulting TRH provider listed and b)  the [facility] team listed 2. Log into [email or web address]  and use 's universal password to access. If you do not have the password, please contact the hospital operator. 3. Locate the [facility] provider you are looking for under [facility] and page to a number that you can be directly reached. 4. If you still have difficulty reaching the provider, please page the [facility] (Director on Call) for the Hospitalists listed on amion for assistance.  10/19/2019, 7:48 PM   .  Using Dragon voice recognition software and may contain some unintended transcription errors.

## 2019-10-19 NOTE — Progress Notes (Signed)
Pharmacy Antibiotic Note  Jeremy [name] is a 73 y.o. male admitted on 10/19/2019 with sepsis.  Pharmacy has been consulted for cefepime and vancomycin dosing.  Plan: Vancomycin 1000mg  IV every 8 hours.  Goal trough 15-20 mcg/mL. cefepime 2gm iv q8h  Height: 6\' 2"  (188 cm) Weight: 81.6 kg (180 lb) IBW/kg (Calculated) : 82.2  Temp (24hrs), Avg:101 F (38.3 C), Min:98.6 F (37 C), Max:103.3 F (39.6 C)  Recent Labs  Lab 10/14/19 0928 10/16/19 0403 10/17/19 0531 10/18/19 0404 10/19/19 1720 10/19/19 1722  WBC 13.6* 10.3 5.5 5.5 4.7  --   CREATININE 0.86 0.71 0.59* 0.54* 0.75  --   LATICACIDVEN  --   --   --   --   --  1.4    Estimated Creatinine Clearance: 92.1 mL/min (by C-G formula based on SCr of 0.75 mg/dL).    Allergies  Allergen Reactions  . Aleve [Naproxen Sodium] Hives and Rash    Antimicrobials this admission: 5/16 cefepime >>  5/16 vancomycin >>  5/16 metronidazole x 1   Microbiology results: 5/16 BCx: sent 5/16 UCx: sent    Thank you for allowing pharmacy to be a part of this patient's care.  Donna Christen Hayden Kihara 10/19/2019 6:03 PM

## 2019-10-20 ENCOUNTER — Telehealth: Payer: Self-pay | Admitting: Medical Oncology

## 2019-10-20 LAB — CBC WITH DIFFERENTIAL/PLATELET
Abs Immature Granulocytes: 0.07 10*3/uL (ref 0.00–0.07)
Abs Immature Granulocytes: 0.06 10*3/uL (ref 0.00–0.07)
Basophils Absolute: 0 10*3/uL (ref 0.0–0.1)
Basophils Absolute: 0 10*3/uL (ref 0.0–0.1)
Basophils Relative: 0 %
Basophils Relative: 0 %
Eosinophils Absolute: 0 10*3/uL (ref 0.0–0.5)
Eosinophils Absolute: 0 10*3/uL (ref 0.0–0.5)
Eosinophils Relative: 0 %
Eosinophils Relative: 0 %
HCT: 23.3 % — ABNORMAL LOW (ref 39.0–52.0)
HCT: 23 % — ABNORMAL LOW (ref 39.0–52.0)
Hemoglobin: 7.2 g/dL — ABNORMAL LOW (ref 13.0–17.0)
Hemoglobin: 7.4 g/dL — ABNORMAL LOW (ref 13.0–17.0)
Immature Granulocytes: 2 %
Immature Granulocytes: 1 %
Lymphocytes Relative: 7 %
Lymphocytes Relative: 6 %
Lymphs Abs: 0.3 10*3/uL — ABNORMAL LOW (ref 0.7–4.0)
Lymphs Abs: 0.4 10*3/uL — ABNORMAL LOW (ref 0.7–4.0)
MCH: 29.5 pg (ref 26.0–34.0)
MCH: 30.2 pg (ref 26.0–34.0)
MCHC: 30.9 g/dL (ref 30.0–36.0)
MCHC: 32.2 g/dL (ref 30.0–36.0)
MCV: 95.5 fL (ref 80.0–100.0)
MCV: 93.9 fL (ref 80.0–100.0)
Monocytes Absolute: 0.2 10*3/uL (ref 0.1–1.0)
Monocytes Absolute: 0.2 10*3/uL (ref 0.1–1.0)
Monocytes Relative: 3 %
Monocytes Relative: 4 %
Neutro Abs: 4 10*3/uL (ref 1.7–7.7)
Neutro Abs: 5 10*3/uL (ref 1.7–7.7)
Neutrophils Relative %: 88 %
Neutrophils Relative %: 89 %
Platelets: 74 10*3/uL — ABNORMAL LOW (ref 150–400)
Platelets: 67 10*3/uL — ABNORMAL LOW (ref 150–400)
RBC: 2.44 MIL/uL — ABNORMAL LOW (ref 4.22–5.81)
RBC: 2.45 MIL/uL — ABNORMAL LOW (ref 4.22–5.81)
RDW: 17.7 % — ABNORMAL HIGH (ref 11.5–15.5)
RDW: 17.3 % — ABNORMAL HIGH (ref 11.5–15.5)
WBC: 4.6 10*3/uL (ref 4.0–10.5)
WBC: 5.7 10*3/uL (ref 4.0–10.5)
nRBC: 0 % (ref 0.0–0.2)
nRBC: 0 % (ref 0.0–0.2)

## 2019-10-20 LAB — GLUCOSE, CAPILLARY
Glucose-Capillary: 101 mg/dL — ABNORMAL HIGH (ref 70–99)
Glucose-Capillary: 148 mg/dL — ABNORMAL HIGH (ref 70–99)
Glucose-Capillary: 289 mg/dL — ABNORMAL HIGH (ref 70–99)

## 2019-10-20 LAB — BASIC METABOLIC PANEL
Anion gap: 10 (ref 5–15)
BUN: 12 mg/dL (ref 8–23)
CO2: 27 mmol/L (ref 22–32)
Calcium: 7.8 mg/dL — ABNORMAL LOW (ref 8.9–10.3)
Chloride: 95 mmol/L — ABNORMAL LOW (ref 98–111)
Creatinine, Ser: 0.55 mg/dL — ABNORMAL LOW (ref 0.61–1.24)
GFR calc Af Amer: >60 mL/min (ref >60)
GFR calc non Af Amer: >60 mL/min (ref >60)
Glucose, Bld: 118 mg/dL — ABNORMAL HIGH (ref 70–99)
Potassium: 3.3 mmol/L — ABNORMAL LOW (ref 3.5–5.1)
Sodium: 132 mmol/L — ABNORMAL LOW (ref 135–145)

## 2019-10-20 MED ORDER — LEVALBUTEROL HCL 1.25 MG/0.5ML IN NEBU
1.2500 mg | INHALATION_SOLUTION | Freq: Four times a day (QID) | RESPIRATORY_TRACT | Status: DC
  Administered 2019-10-20 (×3): 1.25 mg via RESPIRATORY_TRACT
  Filled 2019-10-20 (×3): qty 0.5

## 2019-10-20 MED ORDER — TRIAMCINOLONE ACETONIDE 0.1 % EX CREA
TOPICAL_CREAM | Freq: Four times a day (QID) | CUTANEOUS | Status: DC
  Administered 2019-10-22 – 2019-10-25 (×2): 1 via TOPICAL
  Filled 2019-10-20 (×2): qty 15

## 2019-10-20 MED ORDER — LEVALBUTEROL HCL 1.25 MG/0.5ML IN NEBU: 1.2500 mg | INHALATION_SOLUTION | Freq: Three times a day (TID) | RESPIRATORY_TRACT | Status: DC | PRN

## 2019-10-20 MED ORDER — DILTIAZEM HCL 30 MG PO TABS
30.0000 mg | ORAL_TABLET | Freq: Three times a day (TID) | ORAL | Status: DC
  Administered 2019-10-20 – 2019-10-21 (×2): 30 mg via ORAL
  Filled 2019-10-20 (×2): qty 1

## 2019-10-20 MED ORDER — IPRATROPIUM BROMIDE 0.02 % IN SOLN
0.5000 mg | Freq: Three times a day (TID) | RESPIRATORY_TRACT | Status: DC
  Administered 2019-10-21: 0.5 mg via RESPIRATORY_TRACT
  Filled 2019-10-20: qty 2.5

## 2019-10-20 MED ORDER — IPRATROPIUM BROMIDE 0.02 % IN SOLN
0.5000 mg | Freq: Four times a day (QID) | RESPIRATORY_TRACT | Status: DC
  Administered 2019-10-20 (×3): 0.5 mg via RESPIRATORY_TRACT
  Filled 2019-10-20 (×3): qty 2.5

## 2019-10-20 MED ORDER — LEVALBUTEROL HCL 1.25 MG/0.5ML IN NEBU
1.2500 mg | INHALATION_SOLUTION | Freq: Three times a day (TID) | RESPIRATORY_TRACT | Status: DC
  Administered 2019-10-21: 1.25 mg via RESPIRATORY_TRACT
  Filled 2019-10-20: qty 0.5

## 2019-10-20 NOTE — Progress Notes (Signed)
Pt stated he was feeling really bad and had pain 10/10. Gave prn pain medication (see mar). Pt requested to ambulate to the bathroom, however, pt was too weak. Pt rectal temp 102.1 gave prn medication (see mar). Call bell in reach, bed in low position with alarms active, fall mats in place, yellow socks on pt, and  pt educated on calling for assistance

## 2019-10-20 NOTE — TOC Initial Note (Signed)
Transition of Care [facility]) - Initial/Assessment Note    Patient Details  Name: Jeremy [name] MRN: [id number] Date of Birth: 03-Jun-1946  Transition of Care [facility]) CM/SW Contact:    Shade Flood, LCSW Phone Number: 10/20/2019, 11:11 AM  Clinical Narrative:                  Pt discharged from [facility] on 10/17/19. [place] TOC was involved in pt's dc planning from that stay. Pt went home with referral to [facility] for PT/OT HH. Pt re-admits to [facility] with sepsis. Pt lives with his wife at home. TOC will follow and continue to assess for dc planning needs.  Expected Discharge Plan: [place] Barriers to Discharge: Continued Medical Work up   Patient Goals and CMS Choice        Expected Discharge Plan and Services Expected Discharge Plan: [place] In-house Referral: Clinical Social Work     Living arrangements for the past 2 months: [place]                                      Prior Living Arrangements/Services Living arrangements for the past 2 months: Single Family Home Lives with:: Spouse Patient language and need for interpreter reviewed:: Yes Do you feel safe going back to the place where you live?: Yes      Need for Family Participation in Patient Care: Yes (Comment) Care giver support system in place?: Yes (comment) Current home services: DME, Home OT, Home PT Criminal Activity/Legal Involvement Pertinent to Current Situation/Hospitalization: No - Comment as needed  Activities of Daily Living Home Assistive Devices/Equipment: Other (Comment), Walker (specify type), CBG Meter ADL Screening (condition at time of admission) Patient's cognitive ability adequate to safely complete daily activities?: Yes Is the patient deaf or have difficulty hearing?: No Does the patient have difficulty seeing, even when wearing glasses/contacts?: No Does the patient have difficulty concentrating, remembering, or making  decisions?: No Patient able to express need for assistance with ADLs?: Yes Does the patient have difficulty dressing or bathing?: Yes Independently performs ADLs?: No Communication: Independent Dressing (OT): Needs assistance Is this a change from baseline?: Change from baseline, expected to last >3 days Grooming: Needs assistance Feeding: Needs assistance Is this a change from baseline?: Change from baseline, expected to last >3 days Bathing: Needs assistance Is this a change from baseline?: Change from baseline, expected to last >3 days Toileting: Needs assistance Is this a change from baseline?: Change from baseline, expected to last >3days In/Out Bed: Needs assistance Is this a change from baseline?: Change from baseline, expected to last >3 days Walks in Home: Needs assistance Is this a change from baseline?: Change from baseline, expected to last >3 days Does the patient have difficulty walking or climbing stairs?: Yes Weakness of Legs: Both Weakness of Arms/Hands: Both  Permission Sought/Granted                  Emotional Assessment       Orientation: : Oriented to Self, Oriented to Place, Oriented to  Time, Oriented to Situation Alcohol / Substance Use: Not Applicable Psych Involvement: No (comment)  Admission diagnosis:  Sepsis ([place]) [A41.9] Sepsis due to undetermined organism ([place]) [A41.9] Malignant neoplasm of lung, unspecified laterality, unspecified part of lung ([place]) [C34.90] Sepsis without acute organ dysfunction, due to unspecified organism [facility]) [A41.9] Patient Active  Problem List   Diagnosis Date Noted  . Sepsis due to undetermined organism ([place]) 10/19/2019  . Thrombocytopenia ([place]) 10/19/2019  . Failure to thrive in adult 10/16/2019  . Generalized weakness 10/16/2019  . Drug-induced skin rash 09/23/2019  . 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[place]) 08/26/2019  . Encounter for antineoplastic chemotherapy 08/26/2019  . Encounter for  antineoplastic immunotherapy 08/26/2019  . Goals of care, counseling/discussion 08/26/2019  . Atrial fibrillation with RVR ([place])   . Protein-calorie malnutrition, severe 08/07/2019  . Hemoptysis 08/06/2019  . Mass of right lung 08/06/2019  . Former smoker 08/06/2019  . Lung mass 08/06/2019  . Postobstructive pneumonia 08/06/2019  . COPD (chronic obstructive pulmonary disease) ([place])   . HTN (hypertension)   . Abnormal weight loss   . Leukocytosis   . Acute and chronic respiratory failure with hypoxia ([place])   . Anemia in neoplastic disease   . Hyperglycemia   . Heme + stool 11/06/2018  . Type 2 diabetes mellitus ([place]) 2018   PCP:  [name], MD Pharmacy:   [place], [place] [address] [place] [address] Phone: [phone] Fax: [phone]     Social Determinants of Health (SDOH) Interventions    Readmission Risk Interventions Readmission Risk Prevention Plan 10/20/2019  Transportation Screening Complete  HRI or Home Care Consult Complete  Social Work Consult for [place] Planning/Counseling Complete  Palliative Care Screening Not Applicable  Medication Review Press photographer) Complete  Some recent data might be hidden

## 2019-10-20 NOTE — Telephone Encounter (Signed)
Admitted to [facility] ,sepsis. Pt doing better today. I told wife to call central scheduling to make CT scan appt. 5/31.

## 2019-10-20 NOTE — Plan of Care (Signed)

## 2019-10-20 NOTE — Progress Notes (Addendum)
Patient Demographics:    Jeremy [name], is a 74 y.o. male, DOB - 12/07/1945, EPP:[id number]  Admit date - 10/19/2019   Admitting Physician [name], MD  Outpatient Primary MD for the patient is [name], [name], MD  LOS - 1   Chief Complaint  Patient presents with  . Code Sepsis        Subjective:    [name] today has  no emesis,  No chest pain,   -Wife at bedside, -Questions answered -Some shortness of breath noted,  Assessment  & Plan :    Principal Problem:   Sepsis due to undetermined organism [facility]) Active Problems:   COPD (chronic obstructive pulmonary disease) ([place])   Type 2 diabetes mellitus (HCC)   HTN (hypertension)   Anemia in neoplastic disease   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HCC)   Thrombocytopenia (HCC)  Brief Summary:- 74 y.o. male with medical history significant of right lung cancer, COPD, type 2 diabetes, hypertension, psoriatic arthritis, with status post palliative chemo 10/14/2019 admitted on 10/19/2019 with pneumonia concerns about sepsis  A/p  1) sepsis of unknown source--- suspect pneumonia related in the patient with lung cancer --Continue cefepime and vancomycin -Okay to stop Flagyl  2)DM2-recent A1c 7.0 reflecting fair diabetic control PTA -Continue Metformin and Use Novolog/Humalog Sliding scale insulin with Accu-Cheks/Fingersticks as ordered    3) stage IV squamous cell carcinoma of right lung - Necrotic right lung mass, last palliative chemo on 10/14/2019--- [name] who is patient's oncologist notified of admission to the hospital  4) anemia and thrombocytopenia  in the patient with underlying malignancy currently undergoing chemotherapy--- monitor closely and transfuse as clinically indicated  5)HTN=-continue Cardizem  6) COPD----antibiotics as above #1, continue bronchodilators and mucolytics  7) acute on chronic  hypoxic  respiratory failure--- PTA patient was on 2 L of oxygen at home, currently requiring up to 3 L here--- suspect due to #1 and #6 above as well as #3  Disposition/Need for in-Hospital Stay- patient unable to be discharged at this time due to ---sepsis secondary to suspected pulmonary etiology, requiring IV cefepime and IV Vanco  -Patient From: home D/C Place: home Barriers: Not Clinically Stable-   Code Status : DNR  Family Communication:   (patient is alert, awake and coherent) -Discussed with patient's wife at bedside  Consults  :  na DVT Prophylaxis  :   - SCDs low-platelets  Lab Results  Component Value Date   PLT 74 (L) 10/20/2019    Inpatient Medications  Scheduled Meds: . vitamin C  1,000 mg Oral Daily  . diltiazem  30 mg Oral TID  . ferrous sulfate  325 mg Oral Q supper  . folic acid  1 mg Oral Daily  . gabapentin  600 mg Oral QHS  . insulin aspart  0-15 Units Subcutaneous TID WC  . ipratropium  0.5 mg Nebulization Q6H WA  . levalbuterol  1.25 mg Nebulization Q6H WA  . loratadine  10 mg Oral Daily  . melatonin  4.5 mg Oral QHS  . metFORMIN  1,000 mg Oral Q breakfast  . metFORMIN  500 mg Oral Q supper  . pantoprazole  40 mg Oral Daily  . predniSONE  20 mg Oral  Q breakfast  . triamcinolone cream   Topical QID   Continuous Infusions: . ceFEPime (MAXIPIME) IV 2 g (10/20/19 1408)  . vancomycin 1,000 mg (10/20/19 1549)   PRN Meds:.acetaminophen, lidocaine-prilocaine, traMADol    Anti-infectives (From admission, onward)   Start     Dose/Rate Route Frequency Ordered Stop   10/20/19 0200  ceFEPIme (MAXIPIME) 2 g in sodium chloride 0.9 % 100 mL IVPB     2 g 200 mL/hr over 30 Minutes Intravenous Every 8 hours 10/19/19 1802     10/20/19 0000  vancomycin (VANCOCIN) IVPB 1000 mg/200 mL premix     1,000 mg 200 mL/hr over 60 Minutes Intravenous Every 8 hours 10/19/19 1802     10/19/19 1745  ceFEPIme (MAXIPIME) 2 g in sodium chloride 0.9 % 100 mL IVPB     2 g 200 mL/hr  over 30 Minutes Intravenous  Once 10/19/19 1741 10/19/19 1831   10/19/19 1745  metroNIDAZOLE (FLAGYL) IVPB 500 mg     500 mg 100 mL/hr over 60 Minutes Intravenous  Once 10/19/19 1741 10/19/19 1933   10/19/19 1745  vancomycin (VANCOCIN) IVPB 1000 mg/200 mL premix     1,000 mg 200 mL/hr over 60 Minutes Intravenous  Once 10/19/19 1741 10/19/19 2050        Objective:   Vitals:   10/20/19 0449 10/20/19 0801 10/20/19 1000 10/20/19 1426  BP: (!) 107/54  132/74   Pulse: 93  100   Resp: (!) 22  19   Temp: 100.3 F (37.9 C)     TempSrc:      SpO2: 97% 99% 95% 92%  Weight:      Height:        Wt Readings from Last 3 Encounters:  10/19/19 81.6 kg  10/16/19 81.6 kg  10/14/19 82.7 kg     Intake/Output Summary (Last 24 hours) at 10/20/2019 1758 Last data filed at 10/20/2019 1300 Gross per 24 hour  Intake 3517.49 ml  Output 550 ml  Net 2967.49 ml     Physical Exam  Gen:- Awake Alert,  In no apparent distress  HEENT:- [place].AT, No sclera icterus Nose- [place] 3L/min Neck-Supple Neck,No JVD,.  Lungs-diminished breath sounds with scattered rhonchi, CV- S1, S2 normal, regular , Port-A-Cath site is clean dry and intact Abd-  +ve B.Sounds, Abd Soft, No tenderness,    Extremity/Skin:- No  edema, pedal pulses present Psych-affect is appropriate, oriented x3 Neuro-generalized weakness and deconditioning, no new focal deficits, no tremors   Data Review:   Micro Results Recent Results (from the past 240 hour(s))  Urine culture     Status: None   Collection Time: 10/16/19  5:36 AM   Specimen: Urine, Clean Catch  Result Value Ref Range Status   Specimen Description   Final    URINE, CLEAN CATCH Performed at [facility], [place] [address], [place], [place] [address]    Special Requests   Final    NONE Performed at [facility], [place] [address], [place], [place] [address]    Culture   Final    NO GROWTH Performed at [facility], [place] [address], [place],  [address]    Report Status 10/17/2019 FINAL  Final  SARS Coronavirus 2 by RT PCR (hospital order, performed in [facility] hospital lab) Nasopharyngeal Nasopharyngeal Swab     Status: None   Collection Time: 10/16/19  6:12 AM   Specimen: Nasopharyngeal Swab  Result Value Ref Range Status   SARS  Coronavirus 2 NEGATIVE NEGATIVE Final    Comment: (NOTE) SARS-CoV-2 target nucleic acids are NOT DETECTED. The SARS-CoV-2 RNA is generally detectable in upper and lower respiratory specimens during the acute phase of infection. The lowest concentration of SARS-CoV-2 viral copies this assay can detect is 250 copies / mL. A negative result does not preclude SARS-CoV-2 infection and should not be used as the sole basis for treatment or other patient management decisions.  A negative result may occur with improper specimen collection / handling, submission of specimen other than nasopharyngeal swab, presence of viral mutation(s) within the areas targeted by this assay, and inadequate number of viral copies (<250 copies / mL). A negative result must be combined with clinical observations, patient history, and epidemiological information. Fact Sheet for Patients:   StrictlyIdeas.no Fact Sheet for Healthcare Providers: BankingDealers.co.za This test is not yet approved or cleared  by the [place] FDA and has been authorized for detection and/or diagnosis of SARS-CoV-2 by FDA under an Emergency Use Authorization (EUA).  This EUA will remain in effect (meaning this test can be used) for the duration of the COVID-19 declaration under Section 564(b)(1) of the Act, 21 U.S.C. section 360bbb-3(b)(1), unless the authorization is terminated or revoked sooner. Performed at [facility], [place] [address], [place], [place] [address]   Culture, blood (Routine x 2)     Status: None (Preliminary result)   Collection Time:  10/19/19  5:25 PM   Specimen: BLOOD LEFT ARM  Result Value Ref Range Status   Specimen Description BLOOD LEFT ARM  Final   Special Requests   Final    BOTTLES DRAWN AEROBIC AND ANAEROBIC Blood Culture adequate volume   Culture   Final    NO GROWTH < 24 HOURS Performed at [facility], [address], [place], [place] [address]    Report Status PENDING  Incomplete  Urine culture     Status: Abnormal (Preliminary result)   Collection Time: 10/19/19  5:41 PM   Specimen: In/Out Cath Urine  Result Value Ref Range Status   Specimen Description   Final    IN/OUT CATH URINE Performed at [facility], [address] [place], [place] [address]    Special Requests   Final    NONE Performed at [facility], [address], [place], [place] [address]    Culture (A)  Final    >=100,000 COLONIES/mL ENTEROCOCCUS FAECALIS CULTURE REINCUBATED FOR BETTER GROWTH SUSCEPTIBILITIES TO FOLLOW Performed at [facility], [place] [address] [place], [place] [address]    Report Status PENDING  Incomplete  Culture, blood (Routine x 2)     Status: None (Preliminary result)   Collection Time: 10/19/19  5:50 PM   Specimen: BLOOD LEFT WRIST  Result Value Ref Range Status   Specimen Description BLOOD LEFT WRIST  Final   Special Requests   Final    BOTTLES DRAWN AEROBIC AND ANAEROBIC Blood Culture adequate volume   Culture   Final    NO GROWTH < 24 HOURS Performed at [facility], [address] [place], [place] [address]    Report Status PENDING  Incomplete  SARS Coronavirus 2 by RT PCR (hospital order, performed in [place] hospital lab) Nasopharyngeal Nasopharyngeal Swab     Status: None   Collection Time: 10/19/19  8:17 PM   Specimen: Nasopharyngeal Swab  Result Value Ref Range Status   SARS Coronavirus 2 NEGATIVE NEGATIVE Final    Comment: (NOTE) SARS-CoV-2 target nucleic acids are NOT  DETECTED. The SARS-CoV-2 RNA is generally detectable in upper and lower respiratory specimens during the  acute phase of infection. The lowest concentration of SARS-CoV-2 viral copies this assay can detect is 250 copies / mL. A negative result does not preclude SARS-CoV-2 infection and should not be used as the sole basis for treatment or other patient management decisions.  A negative result may occur with improper specimen collection / handling, submission of specimen other than nasopharyngeal swab, presence of viral mutation(s) within the areas targeted by this assay, and inadequate number of viral copies (<250 copies / mL). A negative result must be combined with clinical observations, patient history, and epidemiological information. Fact Sheet for Patients:   StrictlyIdeas.no Fact Sheet for Healthcare Providers: BankingDealers.co.za This test is not yet approved or cleared  by the [place] FDA and has been authorized for detection and/or diagnosis of SARS-CoV-2 by FDA under an Emergency Use Authorization (EUA).  This EUA will remain in effect (meaning this test can be used) for the duration of the COVID-19 declaration under Section 564(b)(1) of the Act, 21 U.S.C. section 360bbb-3(b)(1), unless the authorization is terminated or revoked sooner. Performed at [facility], [address] [place], [place] [address]     Radiology Reports DG Chest 2 View  Result Date: 10/16/2019 CLINICAL DATA:  Lung cancer and weakness EXAM: CHEST - 2 VIEW COMPARISON:  08/06/2019 CT.  PET-CT 09/08/2019 FINDINGS: Right lower chest opacification with volume loss from necrotic mass by comparison CT. Small to moderate right lateral pleural effusion has increased from prior. Left chest is clear. Stable heart size. Right port with tip at the SVC. IMPRESSION: Necrotic right lung mass as seen on recent staging scan. An overlying right pleural effusion has increased from April. Electronically Signed   By: Monte Fantasia M.D.   On: 10/16/2019 04:43   CT HEAD WO  CONTRAST  Result Date: 10/16/2019 CLINICAL DATA:  Mental status change, unknown cause. Additional provided: Weakness, history of lung cancer EXAM: CT HEAD WITHOUT CONTRAST TECHNIQUE: Contiguous axial images were obtained from the base of the skull through the vertex without intravenous contrast. COMPARISON:  Brain MRI 08/10/2019 FINDINGS: Brain: Please note there is limited assessment for intracranial metastatic disease on this noncontrast CT study. Mild ill-defined hypoattenuation within the cerebral white matter is nonspecific, but consistent with chronic small vessel ischemic disease. Stable, mild generalized parenchymal atrophy. There is no acute intracranial hemorrhage. No demarcated cortical infarct. No extra-axial fluid collection. No evidence of intracranial mass. No midline shift. Vascular: No hyperdense vessel.  Atherosclerotic calcifications Skull: Normal. Negative for fracture or focal lesion. Sinuses/Orbits: Visualized orbits show no acute finding. Paranasal sinus mucosal thickening, most notable within bilateral ethmoid air cells. No significant mastoid effusion. IMPRESSION: 1. Please note there is limited assessment for intracranial metastatic disease on this non-contrast CT study. 2. No evidence of acute intracranial abnormality. 3. Mild generalized parenchymal atrophy and chronic small vessel ischemic disease. 4. Paranasal sinus mucosal thickening, most notably ethmoid. Electronically Signed   By: Kellie Simmering DO   On: 10/16/2019 09:52   DG Chest Port 1 View  Result Date: 10/19/2019 CLINICAL DATA:  Fever and urinary complaints EXAM: PORTABLE CHEST 1 VIEW COMPARISON:  Radiograph 10/16/2019 FINDINGS: Likely increasing size of the complex right pleural effusion with associated passive atelectasis though some underlying consolidation is not excluded. A necrotic lung mass is again seen in the right middle lobe, better visualized on cross-sectional imaging. Left lung is predominantly clear aside  from basilar atelectasis. Left  apical scarring as well. Cardiomediastinal contours are unchanged from prior, partially obscured in the right lung base by overlying opacity. Right IJ Port-A-Cath tip terminates at the superior cavoatrial junction. Telemetry leads overlie the chest. Degenerative changes are present in the imaged spine and shoulders. IMPRESSION: 1. Likely increasing size of the complex right pleural effusion with associated passive atelectasis though some underlying consolidation is not excluded in the setting of fever. 2. Necrotic right lung mass, better visualized on cross-sectional imaging. Electronically Signed   By: Lovena Le M.D.   On: 10/19/2019 17:39   ECHOCARDIOGRAM COMPLETE  Result Date: 10/09/2019    ECHOCARDIOGRAM REPORT   Patient Name:   LYRIQ JARCHOW Nguyenthi Date of Exam: 10/09/2019 Medical Rec #:  [id number]     Height:       74.0 in Accession #:    [id number]    Weight:       179.6 lb Date of Birth:  [birth year]     BSA:          2.076 m Patient Age:    65 years      BP:           117/71 mmHg Patient Gender: M             HR:           94 bpm. Exam Location:  [place] Procedure: 2D Echo, Cardiac Doppler and Color Doppler Indications:     I48.0 PAF  History:         Patient has no prior history of Echocardiogram examinations.                  COPD and Stage IV lung cancer. Anemia, Arrythmias:Atrial                  Fibrillation; Risk Factors:Hypertension, Diabetes and Former                  Smoker.  Sonographer:     Jeneen Montgomery RDMS, RVT, RDCS Referring Phys:  [id number] Alphonse Guild BRANCH Diagnosing Phys: Kate Sable MD  Sonographer Comments: Technically difficult study due to poor echo windows. Image acquisition challenging due to COPD and Image acquisition challenging due to patient body habitus. IMPRESSIONS  1. Left ventricular ejection fraction, by estimation, is 60 to 65%. The left ventricle has normal function. Left ventricular endocardial border not optimally defined to evaluate  regional wall motion. Left ventricular diastolic parameters are consistent with Grade I diastolic dysfunction (impaired relaxation).  2. Right ventricular systolic function is normal. The right ventricular size is moderately enlarged.  3. Right atrial size was mild to moderately dilated.  4. The mitral valve was not well visualized. Trivial mitral valve regurgitation.  5. Tricuspid valve regurgitation unable to assess.  6. The aortic valve was not well visualized. Aortic valve regurgitation is mild.  7. Pulmonic valve regurgitation unable to assess.  8. Aortic dilatation noted. There is mild dilatation of the aortic root. Conclusion(s)/Recommendation(s): Suboptimal study. Consider contrast enhancement for future studies. FINDINGS  Left Ventricle: Left ventricular ejection fraction, by estimation, is 60 to 65%. The left ventricle has normal function. Left ventricular endocardial border not optimally defined to evaluate regional wall motion. The left ventricular internal cavity size was normal in size. There is no left ventricular hypertrophy. Left ventricular diastolic parameters are consistent with Grade I diastolic dysfunction (impaired relaxation). Normal left ventricular filling pressure. Right Ventricle: The right ventricular size is moderately enlarged. Right vetricular wall thickness was not  assessed. Right ventricular systolic function is normal. Left Atrium: Left atrial size was normal in size. Right Atrium: Right atrial size was mild to moderately dilated. Pericardium: There is no evidence of pericardial effusion. Mitral Valve: The mitral valve was not well visualized. Trivial mitral valve regurgitation. Tricuspid Valve: The tricuspid valve is not well visualized. Tricuspid valve regurgitation unable to assess. Aortic Valve: The aortic valve was not well visualized. Aortic valve regurgitation is mild. Pulmonic Valve: The pulmonic valve was not well visualized. Pulmonic valve regurgitation unable to assess.  Aorta: Aortic dilatation noted. There is mild dilatation of the aortic root. Venous: The inferior vena cava was not well visualized. IAS/Shunts: The interatrial septum was not well visualized.  LEFT VENTRICLE PLAX 2D LVIDd:         3.88 cm      Diastology LVIDs:         2.68 cm      LV e' lateral:   5.62 cm/s LV PW:         0.96 cm      LV E/e' lateral: 11.1 LV IVS:        1.21 cm      LV e' medial:    7.86 cm/s LVOT diam:     2.00 cm      LV E/e' medial:  8.0 LV SV:         57 LV SV Index:   27 LVOT Area:     3.14 cm  LV Volumes (MOD) LV vol d, MOD A2C: 68.9 ml LV vol d, MOD A4C: 118.0 ml LV vol s, MOD A2C: 37.0 ml LV vol s, MOD A4C: 47.6 ml LV SV MOD A2C:     31.9 ml LV SV MOD A4C:     118.0 ml LV SV MOD BP:      54.6 ml RIGHT VENTRICLE RV S prime:     14.50 cm/s TAPSE (M-mode): 2.8 cm LEFT ATRIUM             Index LA diam:        2.70 cm 1.30 cm/m LA Vol (A2C):   56.7 ml 27.31 ml/m LA Vol (A4C):   40.7 ml 19.60 ml/m LA Biplane Vol:         23.90 ml/m  AORTIC VALVE LVOT Vmax:   103.00 cm/s LVOT Vmean:  64.000 cm/s LVOT VTI:    0.181 m  AORTA Ao Root diam: 3.80 cm MITRAL VALVE               TRICUSPID VALVE MV Area (PHT):             TR Peak grad:   3.0 mmHg MV Decel Time: 143 msec    TR Vmax:        86.30 cm/s MR Peak grad: 21.8 mmHg MR Vmax:      233.50 cm/s  SHUNTS MV E velocity: 62.60 cm/s  Systemic VTI:  0.18 m MV A velocity: 70.80 cm/s  Systemic Diam: 2.00 cm MV E/A ratio:  0.88 Kate Sable MD Electronically signed by Kate Sable MD Signature Date/Time: 10/09/2019/5:58:14 PM    Final    DG HIP UNILAT WITH PELVIS 2-3 VIEWS RIGHT  Result Date: 10/18/2019 CLINICAL DATA:  Right hip pain for 5 days following fall, initial encounter EXAM: DG HIP (WITH OR WITHOUT PELVIS) 2-3V RIGHT COMPARISON:  None. 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 No acute fracture or dislocation is seen. No soft tissue abnormality is noted.  IMPRESSION: Degenerative change without acute  abnormality. Electronically Signed   By: Inez Catalina M.D.   On: 10/18/2019 12:21     CBC Recent Labs  Lab 10/14/19 2330 10/14/19 0928 10/16/19 0403 10/17/19 0531 10/18/19 0404 10/19/19 1720 10/20/19 0605  WBC 13.6*  --  10.3 5.5 5.5 4.7 4.6  HGB 8.5*  --  8.3* 8.1* 8.0* 8.4* 7.2*  HCT 27.6*   < > 27.1* 26.5* 25.8* 27.4* 23.3*  PLT 197  --  168 121* 131* 99* 74*  MCV 95.8   < > 96.4 95.3 94.9 97.2 95.5  MCH 29.5   < > 29.5 29.1 29.4 29.8 29.5  MCHC 30.8   < > 30.6 30.6 31.0 30.7 30.9  RDW 19.3*   < > 18.9* 18.0* 17.7* 17.8* 17.7*  LYMPHSABS 0.3*  --  0.1*  --   --  0.2* 0.3*  MONOABS 0.3  --  0.1  --   --  0.1 0.2  EOSABS 0.0  --  0.0  --   --  0.0 0.0  BASOSABS 0.0  --  0.0  --   --  0.0 0.0   < > = values in this interval not displayed.    Chemistries  Recent Labs  Lab 10/14/19 0928 10/14/19 0928 10/16/19 0403 10/17/19 0531 10/18/19 0404 10/19/19 1720 10/20/19 0605  NA 139   < > 139 131* 132* 133* 132*  K 4.0   < > 3.8 3.4* 3.5 4.4 3.3*  CL 102   < > 98 94* 97* 96* 95*  CO2 25   < > 30 28 27 28 27   GLUCOSE 280*   < > 183* 114* 110* 161* 118*  BUN 24*   < > 24* 16 15 16 12   CREATININE 0.86  --  0.71 0.59* 0.54* 0.75 0.55*  CALCIUM 8.9   < > 8.0* 7.6* 7.6* 7.7* 7.8*  MG  --   --   --  1.6*  --   --   --   AST 9*  --  12* 19  --  16  --   ALT 17  --  15 18  --  23  --   ALKPHOS 87  --  66 60  --  57  --   BILITOT 0.3  --  0.6 0.6  --  0.5  --    < > = values in this interval not displayed.   ------------------------------------------------------------------------------------------------------------------ No results for input(s): CHOL, HDL, LDLCALC, TRIG, CHOLHDL, LDLDIRECT in the last 72 hours.  Lab Results  Component Value Date   HGBA1C 7.0 (H) 08/07/2019   ------------------------------------------------------------------------------------------------------------------ No results for input(s): TSH, T4TOTAL, T3FREE, THYROIDAB in the last 72  hours.  Invalid input(s): FREET3 ------------------------------------------------------------------------------------------------------------------ No results for input(s): VITAMINB12, FOLATE, FERRITIN, TIBC, IRON, RETICCTPCT in the last 72 hours.  Coagulation profile Recent Labs  Lab 10/19/19 1720  INR 1.0    No results for input(s): DDIMER in the last 72 hours.  Cardiac Enzymes No results for input(s): CKMB, TROPONINI, MYOGLOBIN in the last 168 hours.  Invalid input(s): CK ------------------------------------------------------------------------------------------------------------------    Component Value Date/Time   BNP 104.0 (H) 08/06/2019 1211     Roxan Hockey M.D on 10/20/2019 at 5:58 PM  Go to [email or web address] - for contact info  [facility] - Office  [phone]

## 2019-10-21 ENCOUNTER — Inpatient Hospital Stay: Payer: Medicare Other

## 2019-10-21 DIAGNOSIS — I4891 Unspecified atrial fibrillation: Secondary | ICD-10-CM

## 2019-10-21 DIAGNOSIS — I10 Essential (primary) hypertension: Secondary | ICD-10-CM

## 2019-10-21 DIAGNOSIS — I471 Supraventricular tachycardia: Secondary | ICD-10-CM

## 2019-10-21 LAB — COMPREHENSIVE METABOLIC PANEL
ALT: 21 U/L (ref 0–44)
AST: 19 U/L (ref 15–41)
Albumin: 2 g/dL — ABNORMAL LOW (ref 3.5–5.0)
Alkaline Phosphatase: 51 U/L (ref 38–126)
Anion gap: 10 (ref 5–15)
BUN: 12 mg/dL (ref 8–23)
CO2: 29 mmol/L (ref 22–32)
Calcium: 7.9 mg/dL — ABNORMAL LOW (ref 8.9–10.3)
Chloride: 91 mmol/L — ABNORMAL LOW (ref 98–111)
Creatinine, Ser: 0.69 mg/dL (ref 0.61–1.24)
GFR calc Af Amer: >60 mL/min (ref >60)
GFR calc non Af Amer: >60 mL/min (ref >60)
Glucose, Bld: 141 mg/dL — ABNORMAL HIGH (ref 70–99)
Potassium: 3.2 mmol/L — ABNORMAL LOW (ref 3.5–5.1)
Sodium: 130 mmol/L — ABNORMAL LOW (ref 135–145)
Total Bilirubin: 0.8 mg/dL (ref 0.3–1.2)
Total Protein: 6.3 g/dL — ABNORMAL LOW (ref 6.5–8.1)

## 2019-10-21 LAB — STREP PNEUMONIAE URINARY ANTIGEN: Strep Pneumo Urinary Antigen: NEGATIVE

## 2019-10-21 LAB — GLUCOSE, CAPILLARY
Glucose-Capillary: 239 mg/dL — ABNORMAL HIGH (ref 70–99)
Glucose-Capillary: 270 mg/dL — ABNORMAL HIGH (ref 70–99)
Glucose-Capillary: 184 mg/dL — ABNORMAL HIGH (ref 70–99)

## 2019-10-21 LAB — MAGNESIUM: Magnesium: 1.5 mg/dL — ABNORMAL LOW (ref 1.7–2.4)

## 2019-10-21 LAB — MRSA PCR SCREENING: MRSA by PCR: NEGATIVE

## 2019-10-21 MED ORDER — POTASSIUM CHLORIDE CRYS ER 20 MEQ PO TBCR
40.0000 meq | EXTENDED_RELEASE_TABLET | ORAL | Status: AC
  Administered 2019-10-21 (×2): 40 meq via ORAL
  Filled 2019-10-21 (×2): qty 2

## 2019-10-21 MED ORDER — POTASSIUM CHLORIDE CRYS ER 20 MEQ PO TBCR
40.0000 meq | EXTENDED_RELEASE_TABLET | Freq: Once | ORAL | Status: AC
  Administered 2019-10-21: 40 meq via ORAL
  Filled 2019-10-21: qty 2

## 2019-10-21 MED ORDER — DILTIAZEM HCL 60 MG PO TABS
60.0000 mg | ORAL_TABLET | Freq: Four times a day (QID) | ORAL | Status: DC
  Administered 2019-10-21 – 2019-10-23 (×7): 60 mg via ORAL
  Filled 2019-10-21 (×8): qty 1

## 2019-10-21 MED ORDER — MAGNESIUM SULFATE 4 GM/100ML IV SOLN
4.0000 g | Freq: Once | INTRAVENOUS | Status: AC
  Administered 2019-10-21: 4 g via INTRAVENOUS
  Filled 2019-10-21: qty 100

## 2019-10-21 NOTE — Progress Notes (Signed)
Patient Demographics:    Jeremy [name], is a 74 y.o. male, DOB - 01-Jan-1946, EVO:[id number]  Admit date - 10/19/2019   Admitting Physician [name], MD  Outpatient Primary MD for the patient is [name], [name], MD  LOS - 2   Chief Complaint  Patient presents with  . Code Sepsis        Subjective:    Jeremy [name] today has  no emesis,  No chest pain,    -Patient with episodes of tachyarrhythmia with heart rate at times up to 190 -Patient is largely asymptomatic with these episodes -Wife at bedside, questions answered -  Assessment  & Plan :    Principal Problem:   Sepsis due to undetermined organism [facility]) Active Problems:   COPD (chronic obstructive pulmonary disease) ([place])   Type 2 diabetes mellitus ([place])   HTN (hypertension)   Anemia in neoplastic disease   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HCC)   Thrombocytopenia ([place])  Brief Summary:- 74 y.o. male with medical history significant of right lung cancer, COPD, type 2 diabetes, hypertension, psoriatic arthritis, with status post palliative chemo 10/14/2019 admitted on 10/19/2019 with pneumonia concerns about sepsis  A/p  1)Sepsis of unknown source--- suspect pneumonia related in the patient with lung cancer --Continue cefepime and vancomycin -Check MRSA nasal swab PCR screen -May be able to stop vancomycin if MRSA negative -Okay to stop Flagyl  2)DM2-recent A1c 7.0 reflecting fair diabetic control PTA -Continue Metformin and Use Novolog/Humalog Sliding scale insulin with Accu-Cheks/Fingersticks as ordered   3)Stage IV squamous cell carcinoma of right lung - Necrotic right lung mass, last palliative chemo on 10/14/2019--- [name] who is patient's oncologist notified of admission to the hospital -Keytruda stopped b/c of rash  4)Chronic Anemia and Thrombocytopenia  in the patient with underlying malignancy currently  undergoing chemotherapy--- monitor closely and transfuse as clinically indicated -Platelets down to 67, no bleeding noted, hemoglobin down to 7.4  5)HTN=-continue Cardizem  6)COPD----antibiotics as above #1, continue bronchodilators and mucolytics  7)Acute on chronic  Hypoxic Respiratory Failure--- PTA patient was on 2 L of oxygen at home, currently requiring up to 3 L here--- suspect due to #1 and #6 above as well as #3  8)PAFib-- CHA2DS2- VASc score   is = 3 (HTN, DM and age)   Which is  equal to = 3.2 % annual risk of stroke -Patient is not on anticoagulation due to chronic anemia requiring transfusion from time to time -Discussed with Dr. [name] from cardiology service, -Patient's EF is 60 to 65%, with change Cardizem to 60 mg 4 times daily and transition to long-acting Cardizem for better rate control  9)HFpEF--patient with history of chronic diastolic dysfunction CHF with preserved EF over 60 to 65%, appears euvolemic at this time --Rate control as above in #8  10)PAT/SVT-short runs in the setting of sepsis and hypokalemia-worse with movement, currently on cardizem 30 mg bid.will increase to 60 mg qid and transition to long acting tomorrow. Continue to monitor. K being replaced  11) hypokalemia and hypomagnesemia----replace and recheck, -Electrolyte abnormalities are probably contributing to patient's tachyarrhythmias   Disposition/Need for in-Hospital Stay- patient unable to be discharged at this time due to ---sepsis secondary to suspected pulmonary etiology, requiring  IV cefepime and IV Vanco -Also needs further adjustments of rate control medications -Patient From: home D/C Place: home Barriers: Not Clinically Stable-   Code Status : DNR  Family Communication:   (patient is alert, awake and coherent) -Discussed with patient's wife at bedside  Consults  :  na DVT Prophylaxis  :   - SCDs low-platelets  Lab Results  Component Value Date   PLT 67 (L) 10/20/2019     Inpatient Medications  Scheduled Meds: . vitamin C  1,000 mg Oral Daily  . diltiazem  60 mg Oral Q6H  . ferrous sulfate  325 mg Oral Q supper  . folic acid  1 mg Oral Daily  . gabapentin  600 mg Oral QHS  . insulin aspart  0-15 Units Subcutaneous TID WC  . ipratropium  0.5 mg Nebulization TID  . levalbuterol  1.25 mg Nebulization TID  . loratadine  10 mg Oral Daily  . melatonin  4.5 mg Oral QHS  . metFORMIN  1,000 mg Oral Q breakfast  . metFORMIN  500 mg Oral Q supper  . pantoprazole  40 mg Oral Daily  . triamcinolone cream   Topical QID   Continuous Infusions: . ceFEPime (MAXIPIME) IV 2 g (10/21/19 0631)  . vancomycin 1,000 mg (10/21/19 0830)   PRN Meds:.acetaminophen, levalbuterol, lidocaine-prilocaine, traMADol    Anti-infectives (From admission, onward)   Start     Dose/Rate Route Frequency Ordered Stop   10/20/19 0200  ceFEPIme (MAXIPIME) 2 g in sodium chloride 0.9 % 100 mL IVPB     2 g 200 mL/hr over 30 Minutes Intravenous Every 8 hours 10/19/19 1802     10/20/19 0000  vancomycin (VANCOCIN) IVPB 1000 mg/200 mL premix     1,000 mg 200 mL/hr over 60 Minutes Intravenous Every 8 hours 10/19/19 1802     10/19/19 1745  ceFEPIme (MAXIPIME) 2 g in sodium chloride 0.9 % 100 mL IVPB     2 g 200 mL/hr over 30 Minutes Intravenous  Once 10/19/19 1741 10/19/19 1831   10/19/19 1745  metroNIDAZOLE (FLAGYL) IVPB 500 mg     500 mg 100 mL/hr over 60 Minutes Intravenous  Once 10/19/19 1741 10/19/19 1933   10/19/19 1745  vancomycin (VANCOCIN) IVPB 1000 mg/200 mL premix     1,000 mg 200 mL/hr over 60 Minutes Intravenous  Once 10/19/19 1741 10/19/19 2050        Objective:   Vitals:   10/21/19 0900 10/21/19 0936 10/21/19 1130 10/21/19 1221  BP: (!) 130/119 127/68 (!) 130/119 116/68  Pulse: (!) 127 (!) 109 (!) 127   Resp: (!) 22 17 18    Temp: 98.2 F (36.8 C)  98.2 F (36.8 C)   TempSrc: Oral  Oral   SpO2: 92% 98% 96%   Weight:      Height:        Wt Readings from  Last 3 Encounters:  10/19/19 81.6 kg  10/16/19 81.6 kg  10/14/19 82.7 kg     Intake/Output Summary (Last 24 hours) at 10/21/2019 1505 Last data filed at 10/21/2019 0300 Gross per 24 hour  Intake 240 ml  Output 600 ml  Net -360 ml     Physical Exam  Gen:- Awake Alert,  In no apparent distress  HEENT:- [place].AT, No sclera icterus Nose- [place] 3L/min Neck-Supple Neck,No JVD,.  Lungs-diminished breath sounds with scattered rhonchi, CV- S1, S2 normal, regular , Port-A-Cath site is clean dry and intact Abd-  +ve B.Sounds, Abd Soft, No tenderness,  Extremity/Skin:- No  edema, pedal pulses present Psych-affect is appropriate, oriented x3 Neuro-generalized weakness and deconditioning, no new focal deficits, no tremors   Data Review:   Micro Results Recent Results (from the past 240 hour(s))  Urine culture     Status: None   Collection Time: 10/16/19  5:36 AM   Specimen: Urine, Clean Catch  Result Value Ref Range Status   Specimen Description   Final    URINE, CLEAN CATCH Performed at [facility], [place] [address] [place], [place] [address]    Special Requests   Final    NONE Performed at [facility], [place] [address], [place], [place] [address]    Culture   Final    NO GROWTH Performed at [facility], [place] [address] [place], [place] [address]    Report Status 10/17/2019 FINAL  Final  SARS Coronavirus 2 by RT PCR (hospital order, performed in [facility] hospital lab) Nasopharyngeal Nasopharyngeal Swab     Status: None   Collection Time: 10/16/19  6:12 AM   Specimen: Nasopharyngeal Swab  Result Value Ref Range Status   SARS Coronavirus 2 NEGATIVE NEGATIVE Final    Comment: (NOTE) SARS-CoV-2 target nucleic acids are NOT DETECTED. The SARS-CoV-2 RNA is generally detectable in upper and lower respiratory specimens during the acute phase of infection. The lowest concentration of SARS-CoV-2 viral copies this assay can detect is  250 copies / mL. A negative result does not preclude SARS-CoV-2 infection and should not be used as the sole basis for treatment or other patient management decisions.  A negative result may occur with improper specimen collection / handling, submission of specimen other than nasopharyngeal swab, presence of viral mutation(s) within the areas targeted by this assay, and inadequate number of viral copies (<250 copies / mL). A negative result must be combined with clinical observations, patient history, and epidemiological information. Fact Sheet for Patients:   StrictlyIdeas.no Fact Sheet for Healthcare Providers: BankingDealers.co.za This test is not yet approved or cleared  by the [place] FDA and has been authorized for detection and/or diagnosis of SARS-CoV-2 by FDA under an Emergency Use Authorization (EUA).  This EUA will remain in effect (meaning this test can be used) for the duration of the COVID-19 declaration under Section 564(b)(1) of the Act, 21 U.S.C. section 360bbb-3(b)(1), unless the authorization is terminated or revoked sooner. Performed at [facility], [place] [address], [place], [place] [address]   Culture, blood (Routine x 2)     Status: None (Preliminary result)   Collection Time: 10/19/19  5:25 PM   Specimen: BLOOD LEFT ARM  Result Value Ref Range Status   Specimen Description BLOOD LEFT ARM  Final   Special Requests   Final    BOTTLES DRAWN AEROBIC AND ANAEROBIC Blood Culture adequate volume   Culture   Final    NO GROWTH 2 DAYS Performed at [facility], [address], [place], [place] [address]    Report Status PENDING  Incomplete  Urine culture     Status: Abnormal (Preliminary result)   Collection Time: 10/19/19  5:41 PM   Specimen: In/Out Cath Urine  Result Value Ref Range Status   Specimen Description   Final    IN/OUT CATH URINE Performed at [facility], [address],  [place], [place] [address]    Special Requests   Final    NONE Performed at [facility], [address] [place], [place] [address]  Culture (A)  Final    >=100,000 COLONIES/mL ENTEROCOCCUS FAECALIS SUSCEPTIBILITIES TO FOLLOW Performed at [facility], [place] [address], [place], [place] [address]    Report Status PENDING  Incomplete  Culture, blood (Routine x 2)     Status: None (Preliminary result)   Collection Time: 10/19/19  5:50 PM   Specimen: BLOOD LEFT WRIST  Result Value Ref Range Status   Specimen Description BLOOD LEFT WRIST  Final   Special Requests   Final    BOTTLES DRAWN AEROBIC AND ANAEROBIC Blood Culture adequate volume   Culture   Final    NO GROWTH 2 DAYS Performed at [facility], [address] [place], [place] [address]    Report Status PENDING  Incomplete  SARS Coronavirus 2 by RT PCR (hospital order, performed in [place] hospital lab) Nasopharyngeal Nasopharyngeal Swab     Status: None   Collection Time: 10/19/19  8:17 PM   Specimen: Nasopharyngeal Swab  Result Value Ref Range Status   SARS Coronavirus 2 NEGATIVE NEGATIVE Final    Comment: (NOTE) SARS-CoV-2 target nucleic acids are NOT DETECTED. The SARS-CoV-2 RNA is generally detectable in upper and lower respiratory specimens during the acute phase of infection. The lowest concentration of SARS-CoV-2 viral copies this assay can detect is 250 copies / mL. A negative result does not preclude SARS-CoV-2 infection and should not be used as the sole basis for treatment or other patient management decisions.  A negative result may occur with improper specimen collection / handling, submission of specimen other than nasopharyngeal swab, presence of viral mutation(s) within the areas targeted by this assay, and inadequate number of viral copies (<250 copies / mL). A negative result must be combined with clinical observations, patient history, and epidemiological information. Fact Sheet for Patients:    StrictlyIdeas.no Fact Sheet for Healthcare Providers: BankingDealers.co.za This test is not yet approved or cleared  by the [place] FDA and has been authorized for detection and/or diagnosis of SARS-CoV-2 by FDA under an Emergency Use Authorization (EUA).  This EUA will remain in effect (meaning this test can be used) for the duration of the COVID-19 declaration under Section 564(b)(1) of the Act, 21 U.S.C. section 360bbb-3(b)(1), unless the authorization is terminated or revoked sooner. Performed at [facility], [address], [place], [place] [address]     Radiology Reports DG Chest 2 View  Result Date: 10/16/2019 CLINICAL DATA:  Lung cancer and weakness EXAM: CHEST - 2 VIEW COMPARISON:  08/06/2019 CT.  PET-CT 09/08/2019 FINDINGS: Right lower chest opacification with volume loss from necrotic mass by comparison CT. Small to moderate right lateral pleural effusion has increased from prior. Left chest is clear. Stable heart size. Right port with tip at the SVC. IMPRESSION: Necrotic right lung mass as seen on recent staging scan. An overlying right pleural effusion has increased from April. Electronically Signed   By: Monte Fantasia M.D.   On: 10/16/2019 04:43   CT HEAD WO CONTRAST  Result Date: 10/16/2019 CLINICAL DATA:  Mental status change, unknown cause. Additional provided: Weakness, history of lung cancer EXAM: CT HEAD WITHOUT CONTRAST TECHNIQUE: Contiguous axial images were obtained from the base of the skull through the vertex without intravenous contrast. COMPARISON:  Brain MRI 08/10/2019 FINDINGS: Brain: Please note there is limited assessment for intracranial metastatic disease on this noncontrast CT study. Mild ill-defined hypoattenuation within the cerebral white matter is nonspecific, but consistent with chronic small vessel ischemic disease. Stable, mild generalized parenchymal atrophy. There is  no acute intracranial  hemorrhage. No demarcated cortical infarct. No extra-axial fluid collection. No evidence of intracranial mass. No midline shift. Vascular: No hyperdense vessel.  Atherosclerotic calcifications Skull: Normal. Negative for fracture or focal lesion. Sinuses/Orbits: Visualized orbits show no acute finding. Paranasal sinus mucosal thickening, most notable within bilateral ethmoid air cells. No significant mastoid effusion. IMPRESSION: 1. Please note there is limited assessment for intracranial metastatic disease on this non-contrast CT study. 2. No evidence of acute intracranial abnormality. 3. Mild generalized parenchymal atrophy and chronic small vessel ischemic disease. 4. Paranasal sinus mucosal thickening, most notably ethmoid. Electronically Signed   By: Kellie Simmering DO   On: 10/16/2019 09:52   DG Chest Port 1 View  Result Date: 10/19/2019 CLINICAL DATA:  Fever and urinary complaints EXAM: PORTABLE CHEST 1 VIEW COMPARISON:  Radiograph 10/16/2019 FINDINGS: Likely increasing size of the complex right pleural effusion with associated passive atelectasis though some underlying consolidation is not excluded. A necrotic lung mass is again seen in the right middle lobe, better visualized on cross-sectional imaging. Left lung is predominantly clear aside from basilar atelectasis. Left apical scarring as well. Cardiomediastinal contours are unchanged from prior, partially obscured in the right lung base by overlying opacity. Right IJ Port-A-Cath tip terminates at the superior cavoatrial junction. Telemetry leads overlie the chest. Degenerative changes are present in the imaged spine and shoulders. IMPRESSION: 1. Likely increasing size of the complex right pleural effusion with associated passive atelectasis though some underlying consolidation is not excluded in the setting of fever. 2. Necrotic right lung mass, better visualized on cross-sectional imaging. Electronically Signed   By: Lovena Le M.D.   On: 10/19/2019  17:39   ECHOCARDIOGRAM COMPLETE  Result Date: 10/09/2019    ECHOCARDIOGRAM REPORT   Patient Name:   DONELLE BABA Vanatta Date of Exam: 10/09/2019 Medical Rec #:  [id number]     Height:       74.0 in Accession #:    [id number]    Weight:       179.6 lb Date of Birth:  1945/10/11     BSA:          2.076 m Patient Age:    89 years      BP:           117/71 mmHg Patient Gender: M             HR:           94 bpm. Exam Location:  [place] Procedure: 2D Echo, Cardiac Doppler and Color Doppler Indications:     I48.0 PAF  History:         Patient has no prior history of Echocardiogram examinations.                  COPD and Stage IV lung cancer. Anemia, Arrythmias:Atrial                  Fibrillation; Risk Factors:Hypertension, Diabetes and Former                  Smoker.  Sonographer:     Jeneen Montgomery RDMS, RVT, RDCS Referring Phys:  [id number] Alphonse Guild BRANCH Diagnosing Phys: Kate Sable MD  Sonographer Comments: Technically difficult study due to poor echo windows. Image acquisition challenging due to COPD and Image acquisition challenging due to patient body habitus. IMPRESSIONS  1. Left ventricular ejection fraction, by estimation, is 60 to 65%. The left ventricle has normal function. Left ventricular endocardial border not optimally  defined to evaluate regional wall motion. Left ventricular diastolic parameters are consistent with Grade I diastolic dysfunction (impaired relaxation).  2. Right ventricular systolic function is normal. The right ventricular size is moderately enlarged.  3. Right atrial size was mild to moderately dilated.  4. The mitral valve was not well visualized. Trivial mitral valve regurgitation.  5. Tricuspid valve regurgitation unable to assess.  6. The aortic valve was not well visualized. Aortic valve regurgitation is mild.  7. Pulmonic valve regurgitation unable to assess.  8. Aortic dilatation noted. There is mild dilatation of the aortic root. Conclusion(s)/Recommendation(s): Suboptimal study.  Consider contrast enhancement for future studies. FINDINGS  Left Ventricle: Left ventricular ejection fraction, by estimation, is 60 to 65%. The left ventricle has normal function. Left ventricular endocardial border not optimally defined to evaluate regional wall motion. The left ventricular internal cavity size was normal in size. There is no left ventricular hypertrophy. Left ventricular diastolic parameters are consistent with Grade I diastolic dysfunction (impaired relaxation). Normal left ventricular filling pressure. Right Ventricle: The right ventricular size is moderately enlarged. Right vetricular wall thickness was not assessed. Right ventricular systolic function is normal. Left Atrium: Left atrial size was normal in size. Right Atrium: Right atrial size was mild to moderately dilated. Pericardium: There is no evidence of pericardial effusion. Mitral Valve: The mitral valve was not well visualized. Trivial mitral valve regurgitation. Tricuspid Valve: The tricuspid valve is not well visualized. Tricuspid valve regurgitation unable to assess. Aortic Valve: The aortic valve was not well visualized. Aortic valve regurgitation is mild. Pulmonic Valve: The pulmonic valve was not well visualized. Pulmonic valve regurgitation unable to assess. Aorta: Aortic dilatation noted. There is mild dilatation of the aortic root. Venous: The inferior vena cava was not well visualized. IAS/Shunts: The interatrial septum was not well visualized.  LEFT VENTRICLE PLAX 2D LVIDd:         3.88 cm      Diastology LVIDs:         2.68 cm      LV e' lateral:   5.62 cm/s LV PW:         0.96 cm      LV E/e' lateral: 11.1 LV IVS:        1.21 cm      LV e' medial:    7.86 cm/s LVOT diam:     2.00 cm      LV E/e' medial:  8.0 LV SV:         57 LV SV Index:   27 LVOT Area:     3.14 cm  LV Volumes (MOD) LV vol d, MOD A2C: 68.9 ml LV vol d, MOD A4C: 118.0 ml LV vol s, MOD A2C: 37.0 ml LV vol s, MOD A4C: 47.6 ml LV SV MOD A2C:     31.9 ml LV  SV MOD A4C:     118.0 ml LV SV MOD BP:      54.6 ml RIGHT VENTRICLE RV S prime:     14.50 cm/s TAPSE (M-mode): 2.8 cm LEFT ATRIUM             Index LA diam:        2.70 cm 1.30 cm/m LA Vol (A2C):   56.7 ml 27.31 ml/m LA Vol (A4C):   40.7 ml 19.60 ml/m LA Biplane Vol:         23.90 ml/m  AORTIC VALVE LVOT Vmax:   103.00 cm/s LVOT Vmean:  64.000 cm/s LVOT VTI:  0.181 m  AORTA Ao Root diam: 3.80 cm MITRAL VALVE               TRICUSPID VALVE MV Area (PHT):             TR Peak grad:   3.0 mmHg MV Decel Time: 143 msec    TR Vmax:        86.30 cm/s MR Peak grad: 21.8 mmHg MR Vmax:      233.50 cm/s  SHUNTS MV E velocity: 62.60 cm/s  Systemic VTI:  0.18 m MV A velocity: 70.80 cm/s  Systemic Diam: 2.00 cm MV E/A ratio:  0.88 Kate Sable MD Electronically signed by Kate Sable MD Signature Date/Time: 10/09/2019/5:58:14 PM    Final    DG HIP UNILAT WITH PELVIS 2-3 VIEWS RIGHT  Result Date: 10/18/2019 CLINICAL DATA:  Right hip pain for 5 days following fall, initial encounter EXAM: DG HIP (WITH OR WITHOUT PELVIS) 2-3V RIGHT COMPARISON:  None. 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 No acute fracture or dislocation is seen. No soft tissue abnormality is noted. IMPRESSION: Degenerative change without acute abnormality. Electronically Signed   By: Inez Catalina M.D.   On: 10/18/2019 12:21     CBC Recent Labs  Lab 10/16/19 0403 10/16/19 0403 10/17/19 0531 10/18/19 0404 10/19/19 1720 10/20/19 0605 10/20/19 2327  WBC 10.3   < > 5.5 5.5 4.7 4.6 5.7  HGB 8.3*   < > 8.1* 8.0* 8.4* 7.2* 7.4*  HCT 27.1*   < > 26.5* 25.8* 27.4* 23.3* 23.0*  PLT 168   < > 121* 131* 99* 74* 67*  MCV 96.4   < > 95.3 94.9 97.2 95.5 93.9  MCH 29.5   < > 29.1 29.4 29.8 29.5 30.2  MCHC 30.6   < > 30.6 31.0 30.7 30.9 32.2  RDW 18.9*   < > 18.0* 17.7* 17.8* 17.7* 17.3*  LYMPHSABS 0.1*  --   --   --  0.2* 0.3* 0.4*  MONOABS 0.1  --   --   --  0.1 0.2 0.2  EOSABS 0.0  --   --   --  0.0 0.0 0.0    BASOSABS 0.0  --   --   --  0.0 0.0 0.0   < > = values in this interval not displayed.    Chemistries  Recent Labs  Lab 10/16/19 0403 10/16/19 0403 10/17/19 0531 10/18/19 0404 10/19/19 1720 10/20/19 0605 10/21/19 0541  NA 139   < > 131* 132* 133* 132* 130*  K 3.8   < > 3.4* 3.5 4.4 3.3* 3.2*  CL 98   < > 94* 97* 96* 95* 91*  CO2 30   < > 28 27 28 27 29   GLUCOSE 183*   < > 114* 110* 161* 118* 141*  BUN 24*   < > 16 15 16 12 12   CREATININE 0.71   < > 0.59* 0.54* 0.75 0.55* 0.69  CALCIUM 8.0*   < > 7.6* 7.6* 7.7* 7.8* 7.9*  MG  --   --  1.6*  --   --   --  1.5*  AST 12*  --  19  --  16  --  19  ALT 15  --  18  --  23  --  21  ALKPHOS 66  --  60  --  57  --  51  BILITOT 0.6  --  0.6  --  0.5  --  0.8   < > =  values in this interval not displayed.   ------------------------------------------------------------------------------------------------------------------ No results for input(s): CHOL, HDL, LDLCALC, TRIG, CHOLHDL, LDLDIRECT in the last 72 hours.  Lab Results  Component Value Date   HGBA1C 7.0 (H) 08/07/2019   ------------------------------------------------------------------------------------------------------------------ No results for input(s): TSH, T4TOTAL, T3FREE, THYROIDAB in the last 72 hours.  Invalid input(s): FREET3 ------------------------------------------------------------------------------------------------------------------ No results for input(s): VITAMINB12, FOLATE, FERRITIN, TIBC, IRON, RETICCTPCT in the last 72 hours.  Coagulation profile Recent Labs  Lab 10/19/19 1720  INR 1.0    No results for input(s): DDIMER in the last 72 hours.  Cardiac Enzymes No results for input(s): CKMB, TROPONINI, MYOGLOBIN in the last 168 hours.  Invalid input(s): CK ------------------------------------------------------------------------------------------------------------------    Component Value Date/Time   BNP 104.0 (H) 08/06/2019 1211     Roxan Hockey M.D on 10/21/2019 at 3:05 PM  Go to [email or web address] - for contact info  [facility] - Office  [phone]

## 2019-10-21 NOTE — Progress Notes (Signed)
Pt states he only wants PRN nebulizer treatments. Scheduled treatments D/C and PRN treatments are active

## 2019-10-21 NOTE — Consult Note (Addendum)
Cardiology Consultation:   Patient ID: DOMINYCK RESER MRN: [id number]; DOB: 1946-01-28  Admit date: 10/19/2019 Date of Consult: 10/21/2019  Primary Care Provider: [name], MD Primary Cardiologist: Carlyle Dolly, MD   Primary Electrophysiologist:  None    Patient Profile:   ANZEL KEARSE is a 74 y.o. male with a hx of PAF(no anticoagulation because of significant anemia requiring transfusions who is being seen today for the evaluation of tachyarrhythmias at the request of Dr.Emokpae.  History of Present Illness:   Mr. Apfel with history of HTN, DM, COPD PAF no anticoagulation because of significant anemia requiring transfusions.  CHA2DS2-VASc at least 3 so consider in the future,, stage IV squamous cell lung CA diagnosed 08/2019, last palliative chemo 10/14/2019, chronic anemia of neoplastic disease hemoglobin is low at 7.5.  Was admitted 10/19/2019 with pneumonia and sepsis in the setting of lung cancer. He has since developed atrial tachycardia/SVT 150-190 worse when getting him up in chair/movement. Patient asymptomatic. Denies chest pain, dyspnea, palpitations, dizziness or presyncope. Has been on Cardizem 30 mg bid  Past Medical History:  Diagnosis Date  . Cancer ([place])    mass right lung  . COPD (chronic obstructive pulmonary disease) ([facility])    QUIT SMOKING 30 YRS AGO  . Diabetes ([place]) 2018  . HTN (hypertension)   . Psoriatic arthritis ([place]) 2015    Past Surgical History:  Procedure Laterality Date  . BACK SURGERY  1990  . BIOPSY  12/30/2018   Procedure: BIOPSY;  Surgeon: Danie Binder, MD;  [facility];  Service: Endoscopy;;  gastric  . CATARACT EXTRACTION Bilateral   . CERVICAL SPINE SURGERY  1995  . COLONOSCOPY WITH PROPOFOL N/A 12/30/2018   Procedure: COLONOSCOPY WITH PROPOFOL;  Surgeon: Danie Binder, MD;  [facility];  Service: Endoscopy;  Laterality: N/A;  12:30pm  . ESOPHAGOGASTRODUODENOSCOPY (EGD) WITH PROPOFOL N/A 12/30/2018   Procedure: ESOPHAGOGASTRODUODENOSCOPY (EGD) WITH PROPOFOL;  Surgeon: Danie Binder, MD;  [facility];  Service: Endoscopy;  Laterality: N/A;  . IR IMAGING GUIDED PORT INSERTION  09/04/2019  . POLYPECTOMY  12/30/2018   Procedure: POLYPECTOMY;  Surgeon: Danie Binder, MD;  [facility];  Service: Endoscopy;;  colon  . THORACENTESIS Right 08/08/2019   Procedure: Thoracentesis;  Surgeon: [name], MD;  Location: [facility];  Service: Pulmonary;  Laterality: Right;  Marland Kitchen VIDEO BRONCHOSCOPY Right 08/08/2019   Procedure: Video Bronchoscopy with Erbe Cryo Biopsy of Right mainstem;  Surgeon: [name], MD;  [facility];  Service: Pulmonary;  Laterality: Right;     Home Medications:  Prior to Admission medications   Medication Sig Start Date End Date Taking? Authorizing Provider  acetaminophen (TYLENOL) 500 MG tablet Take 500 mg by mouth every 4 (four) hours as needed (for pain.).   Yes [provider]  albuterol (VENTOLIN HFA) 108 (90 Base) MCG/ACT inhaler Inhale 2 puffs into the lungs every 6 (six) hours as needed for wheezing or shortness of breath.   Yes [provider]  Ascorbic Acid (VITAMIN C) 1000 MG tablet Take 1,000 mg by mouth daily.   Yes [provider]  diltiazem (CARDIZEM) 30 MG tablet Take 1 tablet (30 mg total) by mouth 2 (two) times daily. 09/19/19  Yes BranchAlphonse Guild, MD  docusate sodium (COLACE) 100 MG capsule Take 100 mg by mouth 2 (two) times daily.   Yes [provider]  ferrous sulfate 325 (65 FE) MG tablet Take 325  mg by mouth daily with supper.   Yes [provider]  FLOVENT HFA 110 MCG/ACT inhaler 1 puff 2 (two) times daily. 06/20/19  Yes [provider]  folic acid (FOLVITE) 1 MG tablet Take 1 mg by mouth daily.   Yes [provider]  gabapentin (NEURONTIN) 300 MG capsule Take 600 mg by mouth at bedtime.   Yes [provider]  lidocaine-prilocaine (EMLA) cream Apply to the  Port-A-Cath site 30 minutes before treatment 08/26/19  Yes Curt Bears, MD  loratadine (CLARITIN) 10 MG tablet Take 10 mg by mouth daily.   Yes [provider]  metFORMIN (GLUCOPHAGE-XR) 500 MG 24 hr tablet Take 500-1,000 mg by mouth See admin instructions. Take 2 tablets (1000 mg) by mouth in the morning & 1 tablet (500 mg) by mouth at night. 10/15/18  Yes [provider]  Omega-3 Fatty Acids (FISH OIL) 1000 MG CAPS Take 1,000 mg by mouth daily.    Yes [provider]  pantoprazole (PROTONIX) 40 MG tablet 1 po 30 mins prior to first meal 12/30/18  Yes Fields, Sandi L, MD  predniSONE (DELTASONE) 20 MG tablet Please take 4 tablets (80 mg) daily for one week, followed by 3 tablets (60 mg) daily for 1 week, followed by 2 tablets (20 mg) for one week 09/23/19  Yes Heilingoetter, Cassandra L, PA-C  traMADol (ULTRAM) 50 MG tablet Take 50 mg by mouth every 4 (four) hours as needed. for pain 08/19/18  Yes [provider]  triamcinolone lotion (KENALOG) 0.1 % Apply 1 application topically 4 (four) times daily. 09/15/19  Yes Tanner, Lyndon Code., PA-C  umeclidinium-vilanterol (ANORO ELLIPTA) 62.5-25 MCG/INH AEPB Inhale 1 puff into the lungs daily. 08/13/19  Yes Mariel Aloe, MD  doxycycline (MONODOX) 50 MG capsule Take 50 mg by mouth 2 (two) times daily as needed (rosacea flare ups.).    [provider]  metroNIDAZOLE (METROCREAM) 0.75 % cream Apply 1 application topically 2 (two) times daily as needed (facial rosacea).     [provider]  mometasone (ELOCON) 0.1 % cream Apply 1 application topically 2 (two) times daily as needed (psoriasis).     [provider]  prochlorperazine (COMPAZINE) 10 MG tablet Take 1 tablet (10 mg total) by mouth every 6 (six) hours as needed for nausea or vomiting. 08/26/19   Curt Bears, MD    Inpatient Medications: Scheduled Meds: . vitamin C  1,000 mg Oral Daily  . diltiazem  30 mg Oral TID  . ferrous sulfate  325  mg Oral Q supper  . folic acid  1 mg Oral Daily  . gabapentin  600 mg Oral QHS  . insulin aspart  0-15 Units Subcutaneous TID WC  . ipratropium  0.5 mg Nebulization TID  . levalbuterol  1.25 mg Nebulization TID  . loratadine  10 mg Oral Daily  . melatonin  4.5 mg Oral QHS  . metFORMIN  1,000 mg Oral Q breakfast  . metFORMIN  500 mg Oral Q supper  . pantoprazole  40 mg Oral Daily  . potassium chloride  40 mEq Oral Q3H  . triamcinolone cream   Topical QID   Continuous Infusions: . ceFEPime (MAXIPIME) IV 2 g (10/21/19 0631)  . magnesium sulfate bolus IVPB    . vancomycin 1,000 mg (10/21/19 0830)   PRN Meds: acetaminophen, levalbuterol, lidocaine-prilocaine, traMADol  Allergies:    Allergies  Allergen Reactions  . Chlorhexidine   . Aleve [Naproxen Sodium] Hives and Rash    Social  History:   Social History   Socioeconomic History  . Marital status: Married    Spouse name: Not on file  . Number of children: Not on file  . Years of education: Not on file  . Highest education level: Not on file  Occupational History  . Not on file  Tobacco Use  . Smoking status: Former Smoker    Packs/day: 0.50    Years: 50.00    Pack years: 25.00    Types: Cigarettes    Quit date: 12/26/1991    Years since quitting: 27.8  . Smokeless tobacco: Never Used  Substance and Sexual Activity  . Alcohol use: Yes    Comment: occ beer  . Drug use: Never  . Sexual activity: Yes    Birth control/protection: None  Other Topics Concern  . 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 JHERDEY(8144). 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   Social Determinants of Health   Financial Resource Strain:   . Difficulty of Paying Living Expenses:   Food Insecurity:   . Worried About Charity fundraiser in the Last Year:   . Arboriculturist in the Last Year:   Transportation Needs:   . Film/video editor (Medical):   Marland Kitchen Lack of  Transportation (Non-Medical):   Physical Activity:   . Days of Exercise per Week:   . Minutes of Exercise per Session:   Stress:   . Feeling of Stress :   Social Connections:   . Frequency of Communication with Friends and Family:   . Frequency of Social Gatherings with Friends and Family:   . Attends Religious Services:   . Active Member of Clubs or Organizations:   . Attends Archivist Meetings:   Marland Kitchen Marital Status:   Intimate Partner Violence:   . Fear of Current or Ex-Partner:   . Emotionally Abused:   Marland Kitchen Physically Abused:   . Sexually Abused:     Family History:     Family History  Problem Relation Age of Onset  . Alcoholism Father   . CAD Brother   . Diabetes Brother   . Colon cancer Neg Hx   . Colon polyps Neg Hx   . Gastric cancer Neg Hx      ROS:  Please see the history of present illness.  Review of Systems  Constitution: Positive for malaise/fatigue.  HENT: Negative.   Cardiovascular: Negative.   Respiratory: Positive for cough.   Endocrine: Negative.   Hematologic/Lymphatic: Negative.   Musculoskeletal: Positive for muscle weakness.  Gastrointestinal: Negative.   Genitourinary: Negative.   Neurological: Positive for weakness.    All other ROS reviewed and negative.     Physical Exam/Data:   Vitals:   10/21/19 0448 10/21/19 0810 10/21/19 0826 10/21/19 0900  BP: 129/77  123/85 (!) 130/119  Pulse: (!) 104   (!) 127  Resp:    (!) 22  Temp: 98.7 F (37.1 C)     TempSrc:      SpO2: 95% 98%  92%  Weight:      Height:        Intake/Output Summary (Last 24 hours) at 10/21/2019 0915 Last data filed at 10/21/2019 0300 Gross per 24 hour  Intake 480 ml  Output 600 ml  Net -120 ml   Last 3 Weights 10/19/2019 10/16/2019 10/14/2019  Weight (lbs) 180 lb 180 lb 182 lb 4.8 oz  Weight (  kg) 81.647 kg 81.647 kg 82.691 kg     Body mass index is 23.11 kg/m.  General:  Well nourished, well developed, in no acute distress HEENT: normal Lymph: no  adenopathy Neck: no JVD Endocrine:  No thryomegaly Vascular: No carotid bruits; FA pulses 2+ bilaterally without bruits  Cardiac:  normal S1, S2; RRR; no murmur   Lungs:  Decreased breath sounds R>L no wheezing, rhonchi or rales  Abd: soft, nontender, no hepatomegaly  Ext: no edema Musculoskeletal:  No deformities, BUE and BLE strength normal and equal Skin: warm and dry  Neuro:  CNs 2-12 intact, no focal abnormalities noted Psych:  Normal affect   EKG:  The EKG was personally reviewed and demonstrates:  Sinus tachycardia 120/m no acute change Telemetry:  Telemetry was personally reviewed and demonstrates:  NSR with runs atrial tachycardia/SVT 150-170.   Relevant CV Studies: Echo 10/09/2019 IMPRESSIONS     1. Left ventricular ejection fraction, by estimation, is 60 to 65%. The  left ventricle has normal function. Left ventricular endocardial border  not optimally defined to evaluate regional wall motion. Left ventricular  diastolic parameters are consistent  with Grade I diastolic dysfunction (impaired relaxation).   2. Right ventricular systolic function is normal. The right ventricular  size is moderately enlarged.   3. Right atrial size was mild to moderately dilated.   4. The mitral valve was not well visualized. Trivial mitral valve  regurgitation.   5. Tricuspid valve regurgitation unable to assess.   6. The aortic valve was not well visualized. Aortic valve regurgitation  is mild.   7. Pulmonic valve regurgitation unable to assess.   8. Aortic dilatation noted. There is mild dilatation of the aortic root.   Conclusion(s)/Recommendation(s): Suboptimal study. Consider contrast  enhancement for future studies.    Laboratory Data:  High Sensitivity Troponin:  No results for input(s): TROPONINIHS in the last 720 hours.   Chemistry Recent Labs  Lab 10/19/19 1720 10/20/19 0605 10/21/19 0541  NA 133* 132* 130*  K 4.4 3.3* 3.2*  CL 96* 95* 91*  CO2 28 27 29   GLUCOSE  161* 118* 141*  BUN 16 12 12   CREATININE 0.75 0.55* 0.69  CALCIUM 7.7* 7.8* 7.9*  GFRNONAA >60 >60 >60  GFRAA >60 >60 >60  ANIONGAP 9 10 10     Recent Labs  Lab 10/17/19 0531 10/19/19 1720 10/21/19 0541  PROT 6.4* 6.8 6.3*  ALBUMIN 2.2* 2.3* 2.0*  AST 19 16 19   ALT 18 23 21   ALKPHOS 60 57 51  BILITOT 0.6 0.5 0.8   Hematology Recent Labs  Lab 10/19/19 1720 10/20/19 0605 10/20/19 2327  WBC 4.7 4.6 5.7  RBC 2.82* 2.44* 2.45*  HGB 8.4* 7.2* 7.4*  HCT 27.4* 23.3* 23.0*  MCV 97.2 95.5 93.9  MCH 29.8 29.5 30.2  MCHC 30.7 30.9 32.2  RDW 17.8* 17.7* 17.3*  PLT 99* 74* 67*   BNPNo results for input(s): BNP, PROBNP in the last 168 hours.  DDimer No results for input(s): DDIMER in the last 168 hours.   Radiology/Studies:  DG Chest Port 1 View  Result Date: 10/19/2019 CLINICAL DATA:  Fever and urinary complaints EXAM: PORTABLE CHEST 1 VIEW COMPARISON:  Radiograph 10/16/2019 FINDINGS: Likely increasing size of the complex right pleural effusion with associated passive atelectasis though some underlying consolidation is not excluded. A necrotic lung mass is again seen in the right middle lobe, better visualized on cross-sectional imaging. Left lung is predominantly clear aside from basilar atelectasis. Left apical scarring  as well. Cardiomediastinal contours are unchanged from prior, partially obscured in the right lung base by overlying opacity. Right IJ Port-A-Cath tip terminates at the superior cavoatrial junction. Telemetry leads overlie the chest. Degenerative changes are present in the imaged spine and shoulders. IMPRESSION: 1. Likely increasing size of the complex right pleural effusion with associated passive atelectasis though some underlying consolidation is not excluded in the setting of fever. 2. Necrotic right lung mass, better visualized on cross-sectional imaging. Electronically Signed   By: Lovena Le M.D.   On: 10/19/2019 17:39   DG HIP UNILAT WITH PELVIS 2-3 VIEWS  RIGHT  Result Date: 10/18/2019 CLINICAL DATA:  Right hip pain for 5 days following fall, initial encounter EXAM: DG HIP (WITH OR WITHOUT PELVIS) 2-3V RIGHT COMPARISON:  None. 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 No acute fracture or dislocation is seen. No soft tissue abnormality is noted. IMPRESSION: Degenerative change without acute abnormality. Electronically Signed   By: Inez Catalina M.D.   On: 10/18/2019 12:21        Assessment and Plan:   1. PAF CHA2DS2-VASc at least 3 not on anticoagulation because of significant anemia requiring transfusions.  Normal LVEF with grade 1 DD on echo 10/09/2019-potassium 3.2 today 2. PAT/SVT-short runs in the setting of sepsis and hypokalemia-worse with movement, currently on cardizem 30 mg bid.will increase to 60 mg qid and transition to long acting tomorrow. Continue to monitor. K being replaced 3. Sepsis/pneumonia on antibiotics and steroids 4. Stage IV squamous cell lung CA last  chemo 10/14/2019-Keytruda stopped b/c of rash 5. Hypertension-controlled 6. Hypokalemia-being replaced     For questions or updates, please contact [facility] Please consult [email or web address] for contact info under     Signed, Ermalinda Barrios, PA-C  10/21/2019 9:15 AM  Attending note  Patient seen and discussed with PA Bonnell Public, I agree with her documentation above. 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 Just discharged 10/18/19 with failure to thrive. Readmitted one day later with sepsis thought likely pneumonia.    Mg 1.5 K 3.2 Cr 0.69 WBC 5.7 Hgb 7.4 Plt 67 CXR necrotic lung mass, right pleural effusion EKG sinus tach   Tachycarrhythmias in setting of electrolyte abnormalities, systemic stress from pneumonia/sepsis, chronic lung disease. History of PAF but do not see episodes on tele, having a lot of sinus tach 120s-130s and PSVT. Marland Kitchen He is on dilt 60mg  every 6  hours just increased. Keep K at 4 Mg at 2. If refractory arrhythmias or does not tolerate av nodal agents would transition to amiodarone, given his advanced age and advanced cancer would be a reasonable candidate for amio if needed.   Anemia and thrombocytopenia, would continue not to anticoagulate for his history of afib.    Carlyle Dolly MD

## 2019-10-21 NOTE — Progress Notes (Signed)
   10/21/19 1130  Assess: MEWS Score  Temp 98.2 F (36.8 C)  BP (!) 130/119  Pulse Rate (!) 127  Resp 18  Level of Consciousness Alert  SpO2 96 %  O2 Device Nasal Cannula  O2 Flow Rate (L/min) 2 L/min  Assess: MEWS Score  MEWS Temp 0  MEWS Systolic 0  MEWS Pulse 2  MEWS RR 0  MEWS LOC 0  MEWS Score 2  MEWS Score Color Yellow  Assess: if the MEWS score is Yellow or Red  Were vital signs taken at a resting state? Yes  Focused Assessment Documented focused assessment  Early Detection of Sepsis Score *See Row Information* Low  MEWS guidelines implemented *See Row Information* No, other (Comment) (Patient will continue to have elevateed HR , parameters will)  Treat  MEWS Interventions  (continue to monitor routinely)  Take Vital Signs  Increase Vital Sign Frequency   (no)  Escalate  MEWS: Escalate  (no)  Notify: Charge Nurse/RN  Name of Charge Nurse/RN Notified Edwina Barth RN  Date Charge Nurse/RN Notified 10/21/19  Time Charge Nurse/RN Notified 1138  Notify: Provider  Provider Name/Title Dr. Joesph Fillers  Date Provider Notified 10/21/19  Time Provider Notified 1135  Notification Type Face-to-face  Notification Reason  (documentation and notification)  Response  (no)  Date of Provider Response 10/21/19  Time of Provider Response 1140  Document  Patient Outcome  (no changes, remains stable)  Progress note created (see row info) Yes     10/21/19 1130  Assess: MEWS Score  Temp 98.2 F (36.8 C)  BP (!) 130/119  Pulse Rate (!) 127  Resp 18  Level of Consciousness Alert  SpO2 96 %  O2 Device Nasal Cannula  O2 Flow Rate (L/min) 2 L/min  Assess: MEWS Score  MEWS Temp 0  MEWS Systolic 0  MEWS Pulse 2  MEWS RR 0  MEWS LOC 0  MEWS Score 2  MEWS Score Color Yellow  Assess: if the MEWS score is Yellow or Red  Were vital signs taken at a resting state? Yes  Focused Assessment Documented focused assessment  Early Detection of Sepsis Score *See Row Information* Low   MEWS guidelines implemented *See Row Information* No, other (Comment) (Patient will continue to have elevateed HR , parameters will)  Treat  MEWS Interventions  (continue to monitor routinely)  Take Vital Signs  Increase Vital Sign Frequency   (no)  Escalate  MEWS: Escalate  (no)  Notify: Charge Nurse/RN  Name of Charge Nurse/RN Notified Edwina Barth RN  Date Charge Nurse/RN Notified 10/21/19  Time Charge Nurse/RN Notified 1138  Notify: Provider  Provider Name/Title Dr. Joesph Fillers  Date Provider Notified 10/21/19  Time Provider Notified 1135  Notification Type Face-to-face  Notification Reason  (documentation and notification)  Response  (no)  Date of Provider Response 10/21/19  Time of Provider Response 1140  Document  Patient Outcome  (no changes, remains stable)  Progress note created (see row info) Yes  Patient continues to fluctuate with increased HR and BP. Cardiology and Hospitalist following. Will continue to monitor per order

## 2019-10-21 NOTE — Progress Notes (Signed)
   10/21/19 0900  Assess: MEWS Score  BP (!) 130/119  Pulse Rate (!) 127  Resp (!) 22  Level of Consciousness Alert  SpO2 92 %  O2 Device Nasal Cannula  O2 Flow Rate (L/min) 2 L/min  Assess: MEWS Score  MEWS Temp 0  MEWS Systolic 0  MEWS Pulse 2  MEWS RR 1  MEWS LOC 0  MEWS Score 3  MEWS Score Color Yellow  Assess: if the MEWS score is Yellow or Red  Were vital signs taken at a resting state? No  Focused Assessment Documented focused assessment  Early Detection of Sepsis Score *See Row Information* Low  MEWS guidelines implemented *See Row Information* No, vital signs rechecked  Treat  MEWS Interventions Administered scheduled meds/treatments  Take Vital Signs  Increase Vital Sign Frequency  Yellow: Q 2hr X 2 then Q 4hr X 2, if remains yellow, continue Q 4hrs  Escalate  MEWS: Escalate Yellow: discuss with charge nurse/RN and consider discussing with provider and RRT  Notify: Charge Nurse/RN  Name of Charge Nurse/RN Notified D. Hassell Done RN  Date Charge Nurse/RN Notified 10/21/19  Time Charge Nurse/RN Notified [id number]  Notify: Provider  Provider Name/Title Courage MD  Date Provider Notified 10/21/19  Time Provider Notified 0900  Notification Type Page  Notification Reason Change in status  Response See new orders  Date of Provider Response 10/21/19  Time of Provider Response [id number]  Document  Patient Outcome Other (Comment)  Progress note created (see row info) Yes  Patient ambulating on first run of atrial tach and second run patient also moving and became agitated. Initial set of vitals, patient unwilling to sit still or stop eating. Will follow MEWS protocol, institute orders and continue to assess

## 2019-10-22 LAB — URINE CULTURE: Culture: >=100000 — AB

## 2019-10-22 LAB — MAGNESIUM: Magnesium: 2 mg/dL (ref 1.7–2.4)

## 2019-10-22 LAB — RENAL FUNCTION PANEL
Albumin: 2.1 g/dL — ABNORMAL LOW (ref 3.5–5.0)
Anion gap: 9 (ref 5–15)
BUN: 12 mg/dL (ref 8–23)
CO2: 27 mmol/L (ref 22–32)
Calcium: 8.1 mg/dL — ABNORMAL LOW (ref 8.9–10.3)
Chloride: 99 mmol/L (ref 98–111)
Creatinine, Ser: 0.64 mg/dL (ref 0.61–1.24)
GFR calc Af Amer: >60 mL/min (ref >60)
GFR calc non Af Amer: >60 mL/min (ref >60)
Glucose, Bld: 124 mg/dL — ABNORMAL HIGH (ref 70–99)
Phosphorus: 1.8 mg/dL — ABNORMAL LOW (ref 2.5–4.6)
Potassium: 4.1 mmol/L (ref 3.5–5.1)
Sodium: 135 mmol/L (ref 135–145)

## 2019-10-22 LAB — VITAMIN B1: Vitamin B1 (Thiamine): 104.7 nmol/L (ref 66.5–200.0)

## 2019-10-22 LAB — GLUCOSE, CAPILLARY
Glucose-Capillary: 123 mg/dL — ABNORMAL HIGH (ref 70–99)
Glucose-Capillary: 157 mg/dL — ABNORMAL HIGH (ref 70–99)
Glucose-Capillary: 139 mg/dL — ABNORMAL HIGH (ref 70–99)
Glucose-Capillary: 99 mg/dL (ref 70–99)

## 2019-10-22 LAB — BASIC METABOLIC PANEL
Anion gap: 8 (ref 5–15)
BUN: 12 mg/dL (ref 8–23)
CO2: 27 mmol/L (ref 22–32)
Calcium: 8 mg/dL — ABNORMAL LOW (ref 8.9–10.3)
Chloride: 99 mmol/L (ref 98–111)
Creatinine, Ser: 0.59 mg/dL — ABNORMAL LOW (ref 0.61–1.24)
GFR calc Af Amer: >60 mL/min (ref >60)
GFR calc non Af Amer: >60 mL/min (ref >60)
Glucose, Bld: 123 mg/dL — ABNORMAL HIGH (ref 70–99)
Potassium: 4.1 mmol/L (ref 3.5–5.1)
Sodium: 134 mmol/L — ABNORMAL LOW (ref 135–145)

## 2019-10-22 MED ORDER — SODIUM CHLORIDE 0.9 % IV SOLN
3.0000 g | Freq: Four times a day (QID) | INTRAVENOUS | Status: DC
  Administered 2019-10-22 – 2019-10-27 (×17): 3 g via INTRAVENOUS
  Filled 2019-10-22 (×6): qty 8
  Filled 2019-10-22: qty 3
  Filled 2019-10-22 (×10): qty 8
  Filled 2019-10-22: qty 3
  Filled 2019-10-22: qty 8
  Filled 2019-10-22: qty 3
  Filled 2019-10-22 (×4): qty 8
  Filled 2019-10-22 (×2): qty 3

## 2019-10-22 MED ORDER — SULFAMETHOXAZOLE-TRIMETHOPRIM 800-160 MG PO TABS
1.0000 | ORAL_TABLET | Freq: Two times a day (BID) | ORAL | Status: AC
  Administered 2019-10-22 – 2019-10-24 (×4): 1 via ORAL
  Filled 2019-10-22 (×4): qty 1

## 2019-10-22 MED ORDER — K PHOS MONO-SOD PHOS DI & MONO 155-852-130 MG PO TABS
500.0000 mg | ORAL_TABLET | Freq: Two times a day (BID) | ORAL | Status: DC
  Administered 2019-10-22 – 2019-10-28 (×13): 500 mg via ORAL
  Filled 2019-10-22 (×13): qty 2

## 2019-10-22 NOTE — Progress Notes (Signed)
Pharmacy Antibiotic Note  Jeremy [name] is a 74 y.o. male admitted on 10/19/2019 with sepsis.  Pharmacy has been consulted for cefepime dosing. Pt with stage IV squamous cell carcinoma of right lung . Possible Sepsis of unknown source--> suspect PNA. Urine was hazy but Urine leukocyte negative, nitrite negative, 0-5 WBC (UCx with E.faecalis and staph).  Not feeling well, pain all over.   Plan: Continue cefepime 2gm IV q8h F/U cxs and clinical progress Monitor V/S, labs  Height: 6\' 2"  (188 cm) Weight: 81.6 kg (180 lb) IBW/kg (Calculated) : 82.2  Temp (24hrs), Avg:98.3 F (36.8 C), Min:98.2 F (36.8 C), Max:98.4 F (36.9 C)  Recent Labs  Lab 10/17/19 0531 10/17/19 0531 10/18/19 0404 10/19/19 1720 10/19/19 1722 10/19/19 1946 10/20/19 0605 10/20/19 2327 10/21/19 0541 10/22/19 0434  WBC 5.5  --  5.5 4.7  --   --  4.6 5.7  --   --   CREATININE 0.59*   < > 0.54* 0.75  --   --  0.55*  --  0.69 0.64  0.59*  LATICACIDVEN  --   --   --   --  1.4 1.0  --   --   --   --    < > = values in this interval not displayed.    Estimated Creatinine Clearance: 92.1 mL/min (A) (by C-G formula based on SCr of 0.59 mg/dL (L)).    Allergies  Allergen Reactions  . Chlorhexidine   . Aleve [Naproxen Sodium] Hives and Rash    Antimicrobials this admission: 5/16 cefepime >>  5/16 vancomycin >> 5/19 5/16 metronidazole x 1   Microbiology results: 5/16 BCx: ngtd 5/16 UCx: >100K CFU/ml E.faecalis pan sensitive >100K CFU/ml and  Staph haemolyticus s- cipro, clinda, tcn, vanc r- oxacillin  Thank you for allowing pharmacy to be a part of this patient's care.  Isac Sarna, BS [place], [place] Clinical Pharmacist Pager [phone] 10/22/2019 10:47 AM

## 2019-10-22 NOTE — Care Management Important Message (Signed)
Important Message  Patient Details  Name: Jeremy [name] MRN: [id number] Date of Birth: May 06, 1946   Medicare Important Message Given:  Yes     Tommy Medal 10/22/2019, 12:21 PM

## 2019-10-22 NOTE — Progress Notes (Addendum)
Progress Note  Patient Name: Jeremy [name] Date of Encounter: 10/22/2019  Primary Cardiologist: Carlyle Dolly, MD    Subjective   Feels terrible, thinks he has a fever and pain all over especially right side  Inpatient Medications    Scheduled Meds: . vitamin C  1,000 mg Oral Daily  . diltiazem  60 mg Oral Q6H  . ferrous sulfate  325 mg Oral Q supper  . folic acid  1 mg Oral Daily  . gabapentin  600 mg Oral QHS  . insulin aspart  0-15 Units Subcutaneous TID WC  . loratadine  10 mg Oral Daily  . melatonin  4.5 mg Oral QHS  . metFORMIN  1,000 mg Oral Q breakfast  . metFORMIN  500 mg Oral Q supper  . pantoprazole  40 mg Oral Daily  . triamcinolone cream   Topical QID   Continuous Infusions: . ceFEPime (MAXIPIME) IV 2 g (10/22/19 0526)  . vancomycin 1,000 mg (10/22/19 0129)   PRN Meds: acetaminophen, levalbuterol, lidocaine-prilocaine, traMADol   Vital Signs    Vitals:   10/21/19 1221 10/21/19 1959 10/21/19 2048 10/22/19 0510  BP: 116/68  129/78 125/66  Pulse:   95 96  Resp:   17   Temp:   98.4 F (36.9 C) 98.3 F (36.8 C)  TempSrc:   Oral Oral  SpO2:  96% 96% 96%  Weight:      Height:        Intake/Output Summary (Last 24 hours) at 10/22/2019 0744 Last data filed at 10/22/2019 0636 Gross per 24 hour  Intake --  Output 350 ml  Net -350 ml   Last 3 Weights 10/19/2019 10/16/2019 10/14/2019  Weight (lbs) 180 lb 180 lb 182 lb 4.8 oz  Weight (kg) 81.647 kg 81.647 kg 82.691 kg      Telemetry    NSR with some atrial tachycardia last night 170-190/m - Personally Reviewed  ECG       Physical Exam    GEN: No acute distress.   Neck: No JVD Cardiac: RRR, no murmurs, rubs, or gallops.  Respiratory: decreased breath sounds with diffuse inspiratory and exp wheezing GI: Soft, nontender, non-distended  MS: No edema; No deformity. Neuro:  Nonfocal  Psych: Normal affect   Labs    High Sensitivity Troponin:  No results for input(s): TROPONINIHS in the last  720 hours.    Chemistry Recent Labs  Lab 10/17/19 0531 10/18/19 0404 10/19/19 1720 10/19/19 1720 10/20/19 0605 10/21/19 0541 10/22/19 0434  NA 131*   < > 133*   < > 132* 130* 135  134*  K 3.4*   < > 4.4   < > 3.3* 3.2* 4.1  4.1  CL 94*   < > 96*   < > 95* 91* 99  99  CO2 28   < > 28   < > 27 29 27  27   GLUCOSE 114*   < > 161*   < > 118* 141* 124*  123*  BUN 16   < > 16   < > 12 12 12  12   CREATININE 0.59*   < > 0.75   < > 0.55* 0.69 0.64  0.59*  CALCIUM 7.6*   < > 7.7*   < > 7.8* 7.9* 8.1*  8.0*  PROT 6.4*  --  6.8  --   --  6.3*  --   ALBUMIN 2.2*  --  2.3*  --   --  2.0* 2.1*  AST 19  --  16  --   --  19  --   ALT 18  --  23  --   --  21  --   ALKPHOS 60  --  57  --   --  51  --   BILITOT 0.6  --  0.5  --   --  0.8  --   GFRNONAA >60   < > >60   < > >60 >60 >60  >60  GFRAA >60   < > >60   < > >60 >60 >60  >60  ANIONGAP 9   < > 9   < > 10 10 9  8    < > = values in this interval not displayed.     Hematology Recent Labs  Lab 10/19/19 1720 10/20/19 0605 10/20/19 2327  WBC 4.7 4.6 5.7  RBC 2.82* 2.44* 2.45*  HGB 8.4* 7.2* 7.4*  HCT 27.4* 23.3* 23.0*  MCV 97.2 95.5 93.9  MCH 29.8 29.5 30.2  MCHC 30.7 30.9 32.2  RDW 17.8* 17.7* 17.3*  PLT 99* 74* 67*    BNPNo results for input(s): BNP, PROBNP in the last 168 hours.   DDimer No results for input(s): DDIMER in the last 168 hours.   Radiology    No results found.  Cardiac Studies   Echo 10/09/19 IMPRESSIONS     1. Left ventricular ejection fraction, by estimation, is 60 to 65%. The  left ventricle has normal function. Left ventricular endocardial border  not optimally defined to evaluate regional wall motion. Left ventricular  diastolic parameters are consistent  with Grade I diastolic dysfunction (impaired relaxation).   2. Right ventricular systolic function is normal. The right ventricular  size is moderately enlarged.   3. Right atrial size was mild to moderately dilated.   4. The mitral  valve was not well visualized. Trivial mitral valve  regurgitation.   5. Tricuspid valve regurgitation unable to assess.   6. The aortic valve was not well visualized. Aortic valve regurgitation  is mild.   7. Pulmonic valve regurgitation unable to assess.   8. Aortic dilatation noted. There is mild dilatation of the aortic root.   Conclusion(s)/Recommendation(s): Suboptimal study. Consider contrast  enhancement for future studies.    Patient Profile     74 y.o. male  with a hx of PAF(no anticoagulation because of significant anemia requiring transfusions who is being seen today for the evaluation of tachyarrhythmias at the request of Dr.Emokpae.    Assessment & Plan    PAF CHA2DS2-VASc at least 3 not on anticoagulation because of significant anemia requiring transfusions.  Normal LVEF with grade 1 DD on echo 10/09/2019-potassium 4.1 today  PAT/SVT-short runs in the setting of sepsis and hypokalemia-worse with movement, currently on cardizem  60 mg qid but still having short runs of atrial tachycardia 170-190/m-pain/sepsis contributing. Will discuss starting Amiodarone today with Dr. [name].  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 Prior history of PAF has not been on anticoag due to chronic anemia and thrombocytopenia. This admission in  setting of pneumonia/sepsis and electrolhyte abnormalities has had issues with sinus tach and PSVT. K today 4.1, Mg 2. He is on dilt 60mg  every 6 hours. Tele shows  mild sinus tach, prior ectopy and SVT much improved. Continue current regimen, consolidated to long acting dilt later in admission or at discharge. We will f/u telemetry tomorrow   Zandra Abts MD

## 2019-10-22 NOTE — Progress Notes (Signed)
Patient Demographics:    Jeremy [name], is a 74 y.o. male, DOB - Nov 09, 1945, DZH:[id number]  Admit date - 10/19/2019   Admitting Physician [name], MD  Outpatient Primary MD for the patient is [name], [name], MD  LOS - 3   Chief Complaint  Patient presents with  . Code Sepsis        Subjective:    Jeremy [name] today has  no emesis,  No chest pain,    -Patient with fevers, less frequent episodes of tachycardia -  Assessment  & Plan :    Principal Problem:   Sepsis due to undetermined organism [name]) Active Problems:   COPD (chronic obstructive pulmonary disease) ([place])   Type 2 diabetes mellitus (HCC)   HTN (hypertension)   Anemia in neoplastic disease   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HCC)   Thrombocytopenia (HCC)  Brief Summary:- 74 y.o. male with medical history significant of right lung cancer, COPD, type 2 diabetes, hypertension, psoriatic arthritis, with status post palliative chemo 10/14/2019 admitted on 10/19/2019 with pneumonia concerns about sepsis  A/p  1)Sepsis of unknown source--- suspect pneumonia related in the patient with lung cancer --Continue cefepime and vancomycin -Check MRSA nasal swab PCR screen - stop vancomycin as MRSA negative Antimicrobials this admission: 5/16 cefepime >>  5/16 vancomycin >> 5/19 5/16 metronidazole x 1   Microbiology results: 5/16 BCx: ngtd 5/16 UCx: >100K CFU/ml E.faecalis pan sensitive >100K CFU/ml and  Staph haemolyticus s- cipro, clinda, tcn, vanc r- oxacillin  - -Stop IV cefepime, start Unasyn given fevers, as well as Enterococcus and staph in the urine  2)DM2-recent A1c 7.0 reflecting fair diabetic control PTA -Continue Metformin and Use Novolog/Humalog Sliding scale insulin with Accu-Cheks/Fingersticks as ordered   3)Stage IV squamous cell carcinoma of right lung - Necrotic right lung mass, last palliative  chemo on 10/14/2019--- [name] who is patient's oncologist notified of admission to the hospital -Keytruda stopped b/c of rash  4)Chronic Anemia and Thrombocytopenia  in the patient with underlying malignancy currently undergoing chemotherapy--- monitor closely and transfuse as clinically indicated -Platelets down to 67, no bleeding noted, hemoglobin down to 7.4  5)HTN=-continue Cardizem  6)COPD----antibiotics as above #1, continue bronchodilators and mucolytics  7)Acute on chronic  Hypoxic Respiratory Failure--- PTA patient was on 2 L of oxygen at home, currently requiring up to 3 L here--- suspect due to #1 and #6 above as well as #3  8)PAFib/PAT/PSVT-- CHA2DS2- VASc score   is = 3 (HTN, DM and age)   Which is  equal to = 3.2 % annual risk of stroke -Patient is not on anticoagulation due to chronic anemia requiring transfusion from time to time -Discussed with Dr. [name] from cardiology service, -Patient's EF is 60 to 65%, cardiologist advised changing  Cardizem to  long-acting Cardizem for   rate control  9)HFpEF--patient with history of chronic diastolic dysfunction CHF with preserved EF over 60 to 65%, appears euvolemic at this time --Rate control as above in #8  11) hypokalemia and hypomagnesemia----replaced and normalized =-   Disposition/Need for in-Hospital Stay- patient unable to be discharged at this time due to ---sepsis secondary to suspected pulmonary etiology, requiring IV cefepime and IV Vanco -Also needs further adjustments of  rate control medications -Patient From: home D/C Place: home Barriers: Not Clinically Stable-   Code Status : DNR  Family Communication:   (patient is alert, awake and coherent) -Discussed with patient's wife at bedside  Consults  :  na DVT Prophylaxis  :   - SCDs low-platelets  Lab Results  Component Value Date   PLT 67 (L) 10/20/2019    Inpatient Medications  Scheduled Meds: . vitamin C  1,000 mg Oral Daily  . diltiazem  60  mg Oral Q6H  . ferrous sulfate  325 mg Oral Q supper  . folic acid  1 mg Oral Daily  . gabapentin  600 mg Oral QHS  . insulin aspart  0-15 Units Subcutaneous TID WC  . loratadine  10 mg Oral Daily  . melatonin  4.5 mg Oral QHS  . metFORMIN  1,000 mg Oral Q breakfast  . metFORMIN  500 mg Oral Q supper  . pantoprazole  40 mg Oral Daily  . phosphorus  500 mg Oral BID  . triamcinolone cream   Topical QID   Continuous Infusions: . ceFEPime (MAXIPIME) IV 2 g (10/22/19 1356)   PRN Meds:.acetaminophen, levalbuterol, lidocaine-prilocaine, traMADol    Anti-infectives (From admission, onward)   Start     Dose/Rate Route Frequency Ordered Stop   10/20/19 0200  ceFEPIme (MAXIPIME) 2 g in sodium chloride 0.9 % 100 mL IVPB     2 g 200 mL/hr over 30 Minutes Intravenous Every 8 hours 10/19/19 1802     10/20/19 0000  vancomycin (VANCOCIN) IVPB 1000 mg/200 mL premix  Status:  Discontinued     1,000 mg 200 mL/hr over 60 Minutes Intravenous Every 8 hours 10/19/19 1802 10/22/19 1039   10/19/19 1745  ceFEPIme (MAXIPIME) 2 g in sodium chloride 0.9 % 100 mL IVPB     2 g 200 mL/hr over 30 Minutes Intravenous  Once 10/19/19 1741 10/19/19 1831   10/19/19 1745  metroNIDAZOLE (FLAGYL) IVPB 500 mg     500 mg 100 mL/hr over 60 Minutes Intravenous  Once 10/19/19 1741 10/19/19 1933   10/19/19 1745  vancomycin (VANCOCIN) IVPB 1000 mg/200 mL premix     1,000 mg 200 mL/hr over 60 Minutes Intravenous  Once 10/19/19 1741 10/19/19 2050        Objective:   Vitals:   10/21/19 2048 10/22/19 0510 10/22/19 1433 10/22/19 1705  BP: 129/78 125/66 139/78   Pulse: 95 96 (!) 110   Resp: 17  20   Temp: 98.4 F (36.9 C) 98.3 F (36.8 C) 98.5 F (36.9 C) (!) 101.8 F (38.8 C)  TempSrc: Oral Oral Oral Rectal  SpO2: 96% 96% 98%   Weight:      Height:        Wt Readings from Last 3 Encounters:  10/19/19 81.6 kg  10/16/19 81.6 kg  10/14/19 82.7 kg     Intake/Output Summary (Last 24 hours) at 10/22/2019  1854 Last data filed at 10/22/2019 1700 Gross per 24 hour  Intake 720 ml  Output 350 ml  Net 370 ml     Physical Exam  Gen:- Awake Alert,  In no apparent distress  HEENT:- [place].AT, No sclera icterus Nose- [place] 3L/min Neck-Supple Neck,No JVD,.  Lungs-diminished breath sounds with scattered rhonchi, CV- S1, S2 normal, regular , Port-A-Cath site is clean dry and intact Abd-  +ve B.Sounds, Abd Soft, No tenderness,    Extremity/Skin:- No  edema, pedal pulses present Psych-affect is appropriate, oriented x3 Neuro-generalized weakness and deconditioning, no  new focal deficits, no tremors   Data Review:   Micro Results Recent Results (from the past 240 hour(s))  Urine culture     Status: None   Collection Time: 10/16/19  5:36 AM   Specimen: Urine, Clean Catch  Result Value Ref Range Status   Specimen Description   Final    URINE, CLEAN CATCH Performed at [facility] [place] [address] [place], [place] [address]    Special Requests   Final    NONE Performed at [facility], [place] [address] [place], [place] [address]    Culture   Final    NO GROWTH Performed at [facility], [place] [address] [place], [place] [address]    Report Status 10/17/2019 FINAL  Final  SARS Coronavirus 2 by RT PCR (hospital order, performed in [facility] hospital lab) Nasopharyngeal Nasopharyngeal Swab     Status: None   Collection Time: 10/16/19  6:12 AM   Specimen: Nasopharyngeal Swab  Result Value Ref Range Status   SARS Coronavirus 2 NEGATIVE NEGATIVE Final    Comment: (NOTE) SARS-CoV-2 target nucleic acids are NOT DETECTED. The SARS-CoV-2 RNA is generally detectable in upper and lower respiratory specimens during the acute phase of infection. The lowest concentration of SARS-CoV-2 viral copies this assay can detect is 250 copies / mL. A negative result does not preclude SARS-CoV-2 infection and should not be used as the sole basis for treatment or  other patient management decisions.  A negative result may occur with improper specimen collection / handling, submission of specimen other than nasopharyngeal swab, presence of viral mutation(s) within the areas targeted by this assay, and inadequate number of viral copies (<250 copies / mL). A negative result must be combined with clinical observations, patient history, and epidemiological information. Fact Sheet for Patients:   StrictlyIdeas.no Fact Sheet for Healthcare Providers: BankingDealers.co.za This test is not yet approved or cleared  by the [place] FDA and has been authorized for detection and/or diagnosis of SARS-CoV-2 by FDA under an Emergency Use Authorization (EUA).  This EUA will remain in effect (meaning this test can be used) for the duration of the COVID-19 declaration under Section 564(b)(1) of the Act, 21 U.S.C. section 360bbb-3(b)(1), unless the authorization is terminated or revoked sooner. Performed at [facility], [place] [address], [place], [place] [address]   Culture, blood (Routine x 2)     Status: None (Preliminary result)   Collection Time: 10/19/19  5:25 PM   Specimen: BLOOD LEFT ARM  Result Value Ref Range Status   Specimen Description BLOOD LEFT ARM  Final   Special Requests   Final    BOTTLES DRAWN AEROBIC AND ANAEROBIC Blood Culture adequate volume   Culture   Final    NO GROWTH 3 DAYS Performed at [facility], [address] [place], [place] [address]    Report Status PENDING  Incomplete  Urine culture     Status: Abnormal   Collection Time: 10/19/19  5:41 PM   Specimen: In/Out Cath Urine  Result Value Ref Range Status   Specimen Description   Final    IN/OUT CATH URINE Performed at [facility], [address] [place],  [address]    Special Requests   Final    NONE Performed at [facility], [address], [place],  [address]    Culture (A)  Final     >=100,000 COLONIES/mL ENTEROCOCCUS FAECALIS >=100,000 COLONIES/mL STAPHYLOCOCCUS HAEMOLYTICUS    Report  Status 10/22/2019 FINAL  Final   Organism ID, Bacteria ENTEROCOCCUS FAECALIS (A)  Final   Organism ID, Bacteria STAPHYLOCOCCUS HAEMOLYTICUS (A)  Final      Susceptibility   Enterococcus faecalis - MIC*    AMPICILLIN <=2 SENSITIVE Sensitive     NITROFURANTOIN <=16 SENSITIVE Sensitive     VANCOMYCIN 2 SENSITIVE Sensitive     * >=100,000 COLONIES/mL ENTEROCOCCUS FAECALIS   Staphylococcus haemolyticus - MIC*    CIPROFLOXACIN <=0.5 SENSITIVE Sensitive     GENTAMICIN <=0.5 SENSITIVE Sensitive     NITROFURANTOIN <=16 SENSITIVE Sensitive     OXACILLIN >=4 RESISTANT Resistant     TETRACYCLINE <=1 SENSITIVE Sensitive     VANCOMYCIN 1 SENSITIVE Sensitive     TRIMETH/SULFA <=10 SENSITIVE Sensitive     CLINDAMYCIN <=0.25 SENSITIVE Sensitive     RIFAMPIN <=0.5 SENSITIVE Sensitive     Inducible Clindamycin NEGATIVE Sensitive     * >=100,000 COLONIES/mL STAPHYLOCOCCUS HAEMOLYTICUS  Culture, blood (Routine x 2)     Status: None (Preliminary result)   Collection Time: 10/19/19  5:50 PM   Specimen: BLOOD LEFT WRIST  Result Value Ref Range Status   Specimen Description BLOOD LEFT WRIST  Final   Special Requests   Final    BOTTLES DRAWN AEROBIC AND ANAEROBIC Blood Culture adequate volume   Culture   Final    NO GROWTH 3 DAYS Performed at [facility], [address] [place] [address], [place], [place] [address]    Report Status PENDING  Incomplete  SARS Coronavirus 2 by RT PCR (hospital order, performed in [place] hospital lab) Nasopharyngeal Nasopharyngeal Swab     Status: None   Collection Time: 10/19/19  8:17 PM   Specimen: Nasopharyngeal Swab  Result Value Ref Range Status   SARS Coronavirus 2 NEGATIVE NEGATIVE Final    Comment: (NOTE) SARS-CoV-2 target nucleic acids are NOT DETECTED. The SARS-CoV-2 RNA is generally detectable in upper and lower respiratory specimens during the acute phase of  infection. The lowest concentration of SARS-CoV-2 viral copies this assay can detect is 250 copies / mL. A negative result does not preclude SARS-CoV-2 infection and should not be used as the sole basis for treatment or other patient management decisions.  A negative result may occur with improper specimen collection / handling, submission of specimen other than nasopharyngeal swab, presence of viral mutation(s) within the areas targeted by this assay, and inadequate number of viral copies (<250 copies / mL). A negative result must be combined with clinical observations, patient history, and epidemiological information. Fact Sheet for Patients:   StrictlyIdeas.no Fact Sheet for Healthcare Providers: BankingDealers.co.za This test is not yet approved or cleared  by the [place] FDA and has been authorized for detection and/or diagnosis of SARS-CoV-2 by FDA under an Emergency Use Authorization (EUA).  This EUA will remain in effect (meaning this test can be used) for the duration of the COVID-19 declaration under Section 564(b)(1) of the Act, 21 U.S.C. section 360bbb-3(b)(1), unless the authorization is terminated or revoked sooner. Performed at [facility], [address], [place], [place] [address]   MRSA PCR Screening     Status: None   Collection Time: 10/21/19  3:16 PM   Specimen: Nasal Mucosa; Nasopharyngeal  Result Value Ref Range Status   MRSA by PCR NEGATIVE NEGATIVE Final    Comment:        The GeneXpert MRSA Assay (FDA approved for NASAL specimens only), is one component of a comprehensive MRSA colonization surveillance program. It is not  intended to diagnose MRSA infection nor to guide or monitor treatment for MRSA infections. Performed at [facility], [address], [place], [place] [address]     Radiology Reports DG Chest 2 View  Result Date: 10/16/2019 CLINICAL DATA:  Lung cancer and weakness EXAM: CHEST - 2  VIEW COMPARISON:  08/06/2019 CT.  PET-CT 09/08/2019 FINDINGS: Right lower chest opacification with volume loss from necrotic mass by comparison CT. Small to moderate right lateral pleural effusion has increased from prior. Left chest is clear. Stable heart size. Right port with tip at the SVC. IMPRESSION: Necrotic right lung mass as seen on recent staging scan. An overlying right pleural effusion has increased from April. Electronically Signed   By: Monte Fantasia M.D.   On: 10/16/2019 04:43   CT HEAD WO CONTRAST  Result Date: 10/16/2019 CLINICAL DATA:  Mental status change, unknown cause. Additional provided: Weakness, history of lung cancer EXAM: CT HEAD WITHOUT CONTRAST TECHNIQUE: Contiguous axial images were obtained from the base of the skull through the vertex without intravenous contrast. COMPARISON:  Brain MRI 08/10/2019 FINDINGS: Brain: Please note there is limited assessment for intracranial metastatic disease on this noncontrast CT study. Mild ill-defined hypoattenuation within the cerebral white matter is nonspecific, but consistent with chronic small vessel ischemic disease. Stable, mild generalized parenchymal atrophy. There is no acute intracranial hemorrhage. No demarcated cortical infarct. No extra-axial fluid collection. No evidence of intracranial mass. No midline shift. Vascular: No hyperdense vessel.  Atherosclerotic calcifications Skull: Normal. Negative for fracture or focal lesion. Sinuses/Orbits: Visualized orbits show no acute finding. Paranasal sinus mucosal thickening, most notable within bilateral ethmoid air cells. No significant mastoid effusion. IMPRESSION: 1. Please note there is limited assessment for intracranial metastatic disease on this non-contrast CT study. 2. No evidence of acute intracranial abnormality. 3. Mild generalized parenchymal atrophy and chronic small vessel ischemic disease. 4. Paranasal sinus mucosal thickening, most notably ethmoid. Electronically Signed    By: Kellie Simmering DO   On: 10/16/2019 09:52   DG Chest Port 1 View  Result Date: 10/19/2019 CLINICAL DATA:  Fever and urinary complaints EXAM: PORTABLE CHEST 1 VIEW COMPARISON:  Radiograph 10/16/2019 FINDINGS: Likely increasing size of the complex right pleural effusion with associated passive atelectasis though some underlying consolidation is not excluded. A necrotic lung mass is again seen in the right middle lobe, better visualized on cross-sectional imaging. Left lung is predominantly clear aside from basilar atelectasis. Left apical scarring as well. Cardiomediastinal contours are unchanged from prior, partially obscured in the right lung base by overlying opacity. Right IJ Port-A-Cath tip terminates at the superior cavoatrial junction. Telemetry leads overlie the chest. Degenerative changes are present in the imaged spine and shoulders. IMPRESSION: 1. Likely increasing size of the complex right pleural effusion with associated passive atelectasis though some underlying consolidation is not excluded in the setting of fever. 2. Necrotic right lung mass, better visualized on cross-sectional imaging. Electronically Signed   By: Lovena Le M.D.   On: 10/19/2019 17:39   ECHOCARDIOGRAM COMPLETE  Result Date: 10/09/2019    ECHOCARDIOGRAM REPORT   Patient Name:   SQUARE JOWETT [place] Date of Exam: 10/09/2019 Medical Rec #:  [id number]     Height:       74.0 in Accession #:    [id number]    Weight:       179.6 lb Date of Birth:  12-27-1945     BSA:          2.076 m Patient Age:  73 years      BP:           117/71 mmHg Patient Gender: M             HR:           94 bpm. Exam Location:  [place] Procedure: 2D Echo, Cardiac Doppler and Color Doppler Indications:     I48.0 PAF  History:         Patient has no prior history of Echocardiogram examinations.                  COPD and Stage IV lung cancer. Anemia, Arrythmias:Atrial                  Fibrillation; Risk Factors:Hypertension, Diabetes and Former                   Smoker.  Sonographer:     Jeneen Montgomery RDMS, RVT, RDCS Referring Phys:  [id number] Alphonse Guild BRANCH Diagnosing Phys: Kate Sable MD  Sonographer Comments: Technically difficult study due to poor echo windows. Image acquisition challenging due to COPD and Image acquisition challenging due to patient body habitus. IMPRESSIONS  1. Left ventricular ejection fraction, by estimation, is 60 to 65%. The left ventricle has normal function. Left ventricular endocardial border not optimally defined to evaluate regional wall motion. Left ventricular diastolic parameters are consistent with Grade I diastolic dysfunction (impaired relaxation).  2. Right ventricular systolic function is normal. The right ventricular size is moderately enlarged.  3. Right atrial size was mild to moderately dilated.  4. The mitral valve was not well visualized. Trivial mitral valve regurgitation.  5. Tricuspid valve regurgitation unable to assess.  6. The aortic valve was not well visualized. Aortic valve regurgitation is mild.  7. Pulmonic valve regurgitation unable to assess.  8. Aortic dilatation noted. There is mild dilatation of the aortic root. Conclusion(s)/Recommendation(s): Suboptimal study. Consider contrast enhancement for future studies. FINDINGS  Left Ventricle: Left ventricular ejection fraction, by estimation, is 60 to 65%. The left ventricle has normal function. Left ventricular endocardial border not optimally defined to evaluate regional wall motion. The left ventricular internal cavity size was normal in size. There is no left ventricular hypertrophy. Left ventricular diastolic parameters are consistent with Grade I diastolic dysfunction (impaired relaxation). Normal left ventricular filling pressure. Right Ventricle: The right ventricular size is moderately enlarged. Right vetricular wall thickness was not assessed. Right ventricular systolic function is normal. Left Atrium: Left atrial size was normal in size. Right  Atrium: Right atrial size was mild to moderately dilated. Pericardium: There is no evidence of pericardial effusion. Mitral Valve: The mitral valve was not well visualized. Trivial mitral valve regurgitation. Tricuspid Valve: The tricuspid valve is not well visualized. Tricuspid valve regurgitation unable to assess. Aortic Valve: The aortic valve was not well visualized. Aortic valve regurgitation is mild. Pulmonic Valve: The pulmonic valve was not well visualized. Pulmonic valve regurgitation unable to assess. Aorta: Aortic dilatation noted. There is mild dilatation of the aortic root. Venous: The inferior vena cava was not well visualized. IAS/Shunts: The interatrial septum was not well visualized.  LEFT VENTRICLE PLAX 2D LVIDd:         3.88 cm      Diastology LVIDs:         2.68 cm      LV e' lateral:   5.62 cm/s LV PW:         0.96 cm      LV  E/e' lateral: 11.1 LV IVS:        1.21 cm      LV e' medial:    7.86 cm/s LVOT diam:     2.00 cm      LV E/e' medial:  8.0 LV SV:         57 LV SV Index:   27 LVOT Area:     3.14 cm  LV Volumes (MOD) LV vol d, MOD A2C: 68.9 ml LV vol d, MOD A4C: 118.0 ml LV vol s, MOD A2C: 37.0 ml LV vol s, MOD A4C: 47.6 ml LV SV MOD A2C:     31.9 ml LV SV MOD A4C:     118.0 ml LV SV MOD BP:      54.6 ml RIGHT VENTRICLE RV S prime:     14.50 cm/s TAPSE (M-mode): 2.8 cm LEFT ATRIUM             Index LA diam:        2.70 cm 1.30 cm/m LA Vol (A2C):   56.7 ml 27.31 ml/m LA Vol (A4C):   40.7 ml 19.60 ml/m LA Biplane Vol:         23.90 ml/m  AORTIC VALVE LVOT Vmax:   103.00 cm/s LVOT Vmean:  64.000 cm/s LVOT VTI:    0.181 m  AORTA Ao Root diam: 3.80 cm MITRAL VALVE               TRICUSPID VALVE MV Area (PHT):             TR Peak grad:   3.0 mmHg MV Decel Time: 143 msec    TR Vmax:        86.30 cm/s MR Peak grad: 21.8 mmHg MR Vmax:      233.50 cm/s  SHUNTS MV E velocity: 62.60 cm/s  Systemic VTI:  0.18 m MV A velocity: 70.80 cm/s  Systemic Diam: 2.00 cm MV E/A ratio:  0.88 Kate Sable  MD Electronically signed by Kate Sable MD Signature Date/Time: 10/09/2019/5:58:14 PM    Final    DG HIP UNILAT WITH PELVIS 2-3 VIEWS RIGHT  Result Date: 10/18/2019 CLINICAL DATA:  Right hip pain for 5 days following fall, initial encounter EXAM: DG HIP (WITH OR WITHOUT PELVIS) 2-3V RIGHT COMPARISON:  None. 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 No acute fracture or dislocation is seen. No soft tissue abnormality is noted. IMPRESSION: Degenerative change without acute abnormality. Electronically Signed   By: Inez Catalina M.D.   On: 10/18/2019 12:21     CBC Recent Labs  Lab 10/16/19 0403 10/16/19 0403 10/17/19 0531 10/18/19 0404 10/19/19 1720 10/20/19 0605 10/20/19 2327  WBC 10.3   < > 5.5 5.5 4.7 4.6 5.7  HGB 8.3*   < > 8.1* 8.0* 8.4* 7.2* 7.4*  HCT 27.1*   < > 26.5* 25.8* 27.4* 23.3* 23.0*  PLT 168   < > 121* 131* 99* 74* 67*  MCV 96.4   < > 95.3 94.9 97.2 95.5 93.9  MCH 29.5   < > 29.1 29.4 29.8 29.5 30.2  MCHC 30.6   < > 30.6 31.0 30.7 30.9 32.2  RDW 18.9*   < > 18.0* 17.7* 17.8* 17.7* 17.3*  LYMPHSABS 0.1*  --   --   --  0.2* 0.3* 0.4*  MONOABS 0.1  --   --   --  0.1 0.2 0.2  EOSABS 0.0  --   --   --  0.0 0.0  0.0  BASOSABS 0.0  --   --   --  0.0 0.0 0.0   < > = values in this interval not displayed.    Chemistries  Recent Labs  Lab 10/16/19 0403 10/16/19 0403 10/17/19 0531 10/17/19 0531 10/18/19 0404 10/19/19 1720 10/20/19 0605 10/21/19 0541 10/22/19 0434  NA 139   < > 131*   < > 132* 133* 132* 130* 135  134*  K 3.8   < > 3.4*   < > 3.5 4.4 3.3* 3.2* 4.1  4.1  CL 98   < > 94*   < > 97* 96* 95* 91* 99  99  CO2 30   < > 28   < > 27 28 27 29 27  27   GLUCOSE 183*   < > 114*   < > 110* 161* 118* 141* 124*  123*  BUN 24*   < > 16   < > 15 16 12 12 12  12   CREATININE 0.71   < > 0.59*   < > 0.54* 0.75 0.55* 0.69 0.64  0.59*  CALCIUM 8.0*   < > 7.6*   < > 7.6* 7.7* 7.8* 7.9* 8.1*  8.0*  MG  --   --  1.6*  --   --   --   --   1.5* 2.0  AST 12*  --  19  --   --  16  --  19  --   ALT 15  --  18  --   --  23  --  21  --   ALKPHOS 66  --  60  --   --  57  --  51  --   BILITOT 0.6  --  0.6  --   --  0.5  --  0.8  --    < > = values in this interval not displayed.   ------------------------------------------------------------------------------------------------------------------ No results for input(s): CHOL, HDL, LDLCALC, TRIG, CHOLHDL, LDLDIRECT in the last 72 hours.  Lab Results  Component Value Date   HGBA1C 7.0 (H) 08/07/2019   ------------------------------------------------------------------------------------------------------------------ No results for input(s): TSH, T4TOTAL, T3FREE, THYROIDAB in the last 72 hours.  Invalid input(s): FREET3 ------------------------------------------------------------------------------------------------------------------ No results for input(s): VITAMINB12, FOLATE, FERRITIN, TIBC, IRON, RETICCTPCT in the last 72 hours.  Coagulation profile Recent Labs  Lab 10/19/19 1720  INR 1.0    No results for input(s): DDIMER in the last 72 hours.  Cardiac Enzymes No results for input(s): CKMB, TROPONINI, MYOGLOBIN in the last 168 hours.  Invalid input(s): CK ------------------------------------------------------------------------------------------------------------------    Component Value Date/Time   BNP 104.0 (H) 08/06/2019 1211     Roxan Hockey M.D on 10/22/2019 at 6:54 PM  Go to [email or web address] - for contact info  [facility] - Office  [phone]

## 2019-10-22 NOTE — Progress Notes (Addendum)
Pharmacy Antibiotic Note  Jeremy [name] is a 74 y.o. male admitted on 10/19/2019 with sepsis.  Pharmacy was consulted for cefepime dosing. Possible sepsis of unknown source--> suspect pneumonia.   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 Cefepime has been discontinued, and pharmacy is now consulted to dose Unasyn for CAP (will also cover enterococcus in urine; TMP/SMX was added by provider to cover Staph haemolyticus in urine)  WBC 5.7 (10/20/19), Tmax 101.8 F, Scr 0.62, CrCl 92.1 ml/min (renal function stable)  Plan: Start Unasyn 3 gm IV Q 6 hrs Monitor WBC, temp, clinical improvement, cultures, renal function, length of therapy  Height: 6\' 2"  (188 cm) Weight: 81.6 kg (180 lb) IBW/kg (Calculated) : 82.2  Temp (24hrs), Avg:99.3 F (37.4 C), Min:98.3 F (36.8 C), Max:101.8 F (38.8 C)  Recent Labs  Lab 10/17/19 0531 10/17/19 0531 10/18/19 0404 10/19/19 1720 10/19/19 1722 10/19/19 1946 10/20/19 0605 10/20/19 2327 10/21/19 0541 10/22/19 0434  WBC 5.5  --  5.5 4.7  --   --  4.6 5.7  --   --   CREATININE 0.59*   < > 0.54* 0.75  --   --  0.55*  --  0.69 0.64  0.59*  LATICACIDVEN  --   --   --   --  1.4 1.0  --   --   --   --    < > = values in this interval not displayed.    Estimated Creatinine Clearance: 92.1 mL/min (A) (by C-G formula based on SCr of 0.59 mg/dL (L)).    Allergies  Allergen Reactions  . Chlorhexidine   . Aleve [Naproxen Sodium] Hives and Rash    Antimicrobials this admission: 5/16 Cefepime >> 5/19 5/16 Vancomycin >> 5/19 5/16 Metronidazole x 1  5/19 Unasyn >> 5/19 TMP/SMX PO >>  Microbiology results: 5/16 BCx X 2: NGTD 5/16 UCx: >100,000 CFU/ml E. faecalis pan sensitive; >100,000 CFU/ml Staph haemolyticus susceptible to cipro, clinda, tetracycline, vancomycin, TMP/SMX; resistant to oxacillin 5/16 COVID: negative 5/13 UCx:  NG/final 5/18 MRSA PCR: negative  Thank you for allowing pharmacy to be a part of this patient's care.  Gillermina Hu, PharmD, BCPS, [facility] Clinical Pharmacist 10/22/2019 7:18 PM

## 2019-10-23 LAB — CBC WITH DIFFERENTIAL/PLATELET
Abs Immature Granulocytes: 0.02 10*3/uL (ref 0.00–0.07)
Basophils Absolute: 0 10*3/uL (ref 0.0–0.1)
Basophils Relative: 0 %
Eosinophils Absolute: 0 10*3/uL (ref 0.0–0.5)
Eosinophils Relative: 1 %
HCT: 21.1 % — ABNORMAL LOW (ref 39.0–52.0)
Hemoglobin: 6.5 g/dL — CL (ref 13.0–17.0)
Immature Granulocytes: 1 %
Lymphocytes Relative: 10 %
Lymphs Abs: 0.3 10*3/uL — ABNORMAL LOW (ref 0.7–4.0)
MCH: 29.5 pg (ref 26.0–34.0)
MCHC: 30.8 g/dL (ref 30.0–36.0)
MCV: 95.9 fL (ref 80.0–100.0)
Monocytes Absolute: 0.2 10*3/uL (ref 0.1–1.0)
Monocytes Relative: 6 %
Neutro Abs: 2.8 10*3/uL (ref 1.7–7.7)
Neutrophils Relative %: 82 %
Platelets: 76 10*3/uL — ABNORMAL LOW (ref 150–400)
RBC: 2.2 MIL/uL — ABNORMAL LOW (ref 4.22–5.81)
RDW: 17.3 % — ABNORMAL HIGH (ref 11.5–15.5)
WBC: 3.4 10*3/uL — ABNORMAL LOW (ref 4.0–10.5)
nRBC: 0 % (ref 0.0–0.2)

## 2019-10-23 LAB — GLUCOSE, CAPILLARY
Glucose-Capillary: 126 mg/dL — ABNORMAL HIGH (ref 70–99)
Glucose-Capillary: 170 mg/dL — ABNORMAL HIGH (ref 70–99)
Glucose-Capillary: 120 mg/dL — ABNORMAL HIGH (ref 70–99)
Glucose-Capillary: 126 mg/dL — ABNORMAL HIGH (ref 70–99)

## 2019-10-23 LAB — PREPARE RBC (CROSSMATCH)

## 2019-10-23 MED ORDER — SODIUM CHLORIDE 0.9% IV SOLUTION: Freq: Once | INTRAVENOUS | Status: AC

## 2019-10-23 MED ORDER — DILTIAZEM HCL 60 MG PO TABS
90.0000 mg | ORAL_TABLET | Freq: Four times a day (QID) | ORAL | Status: AC
  Administered 2019-10-23 – 2019-10-24 (×6): 90 mg via ORAL
  Filled 2019-10-23 (×6): qty 1

## 2019-10-23 NOTE — Progress Notes (Signed)
Patient Demographics:    Jeremy [name], is a 74 y.o. male, DOB - 10-30-1945, GBT:[id number]  Admit date - 10/19/2019   Admitting Physician [name], MD  Outpatient Primary MD for the patient is [name], [name], MD  LOS - 4   Chief Complaint  Patient presents with  . Code Sepsis        Subjective:    Jeremy [name] today has  no emesis,  No chest pain,    -Patient with fevers, T-max 101.8 --Wife at bedside,- -dysuria resolving  Assessment  & Plan :    Principal Problem:   Sepsis due to undetermined organism [facility]) Active Problems:   COPD (chronic obstructive pulmonary disease) (HCC)   Type 2 diabetes mellitus (HCC)   HTN (hypertension)   Anemia in neoplastic disease   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HCC)   Thrombocytopenia (HCC)  Brief Summary:- 74 y.o. male with medical history significant of right lung cancer, COPD, type 2 diabetes, hypertension, psoriatic arthritis, with status post palliative chemo 10/14/2019 admitted on 10/19/2019 with pneumonia concerns about sepsis  A/p  1)Sepsis of urinary source---   -Urine culture from 10/19/2019 with Enterococcus and Staphylococcus hemolyticus -Blood cultures NGTD -Fevers persist - stopped vancomycin as MRSA negative Antimicrobials this admission: 5/16 Cefepime >> 5/19 5/16 Vancomycin >> 5/19 5/16 Metronidazole x 1  5/19 Unasyn >> 5/19 TMP/SMX PO >>  Microbiology results: 5/16 BCx X 2: NGTD 5/16 UCx: >100,000 CFU/ml E. faecalis pan sensitive; >100,000 CFU/ml Staph haemolyticus susceptible to cipro, clinda, tetracycline, vancomycin, TMP/SMX; resistant to oxacillin 5/16 COVID: negative 5/13 UCx: NG/final 5/18 MRSA PCR: negative  -Stopped IV cefepime, started Unasyn given fevers, as well as Enterococcus and Bactrim for staph in the urine -Watch renal function closely while on Bactrim -We will repeat blood cultures due  to persistent fevers   2)DM2-recent A1c 7.0 reflecting fair diabetic control PTA -Continue Metformin and Use Novolog/Humalog Sliding scale insulin with Accu-Cheks/Fingersticks as ordered   3)Stage IV squamous cell carcinoma of right lung - Necrotic right lung mass, last palliative chemo on 10/14/2019--- [name] who is patient's oncologist notified of admission to the hospital -Keytruda stopped b/c of rash  4)Chronic Anemia and Thrombocytopenia  in the patient with underlying malignancy currently undergoing chemotherapy--- monitor closely and transfuse as clinically indicated -Platelets down to 67, no bleeding noted, hemoglobin down to 7.4  5)HTN=-continue Cardizem  6)COPD----antibiotics as above #1, continue bronchodilators and mucolytics  7)Acute on chronic  Hypoxic Respiratory Failure--- PTA patient was on 2 L of oxygen at home, currently requiring up to 3 L here--- suspect due to #1 and #6 above as well as #3  8)PAFib/PAT/PSVT-- CHA2DS2- VASc score   is = 3 (HTN, DM and age)   Which is  equal to = 3.2 % annual risk of stroke -Patient is not on anticoagulation due to chronic anemia requiring transfusion from time to time -Discussed with Dr. [name] from cardiology service, -Patient's EF is 60 to 65%, cardiologist advised  increasing Cardizem to 90 mg every 6 hours for better rate control   9)HFpEF--patient with history of chronic diastolic dysfunction CHF with preserved EF over 60 to 65%, appears euvolemic at this time --Rate control as above in #8  11)  hypokalemia and hypomagnesemia----replaced and normalized =-  Disposition/Need for in-Hospital Stay- patient unable to be discharged at this time due to ---sepsis secondary to suspected pulmonary etiology, requiring IV Unasyn--fevers persist -Also needs further adjustments of rate control medications -Patient From: home D/C Place: home Barriers: Not Clinically Stable-   Code Status : DNR  Family Communication:   (patient is  alert, awake and coherent) -Discussed with patient's wife at bedside  Consults  :  na DVT Prophylaxis  :   - SCDs low-platelets  Lab Results  Component Value Date   PLT 67 (L) 10/20/2019    Inpatient Medications  Scheduled Meds: . vitamin C  1,000 mg Oral Daily  . diltiazem  90 mg Oral Q6H  . ferrous sulfate  325 mg Oral Q supper  . folic acid  1 mg Oral Daily  . gabapentin  600 mg Oral QHS  . insulin aspart  0-15 Units Subcutaneous TID WC  . loratadine  10 mg Oral Daily  . melatonin  4.5 mg Oral QHS  . metFORMIN  1,000 mg Oral Q breakfast  . metFORMIN  500 mg Oral Q supper  . pantoprazole  40 mg Oral Daily  . phosphorus  500 mg Oral BID  . sulfamethoxazole-trimethoprim  1 tablet Oral Q12H  . triamcinolone cream   Topical QID   Continuous Infusions: . ampicillin-sulbactam (UNASYN) IV 3 g (10/23/19 1459)   PRN Meds:.acetaminophen, levalbuterol, lidocaine-prilocaine, traMADol    Anti-infectives (From admission, onward)   Start     Dose/Rate Route Frequency Ordered Stop   10/22/19 2000  Ampicillin-Sulbactam (UNASYN) 3 g in sodium chloride 0.9 % 100 mL IVPB     3 g 200 mL/hr over 30 Minutes Intravenous Every 6 hours 10/22/19 1932     10/22/19 2000  sulfamethoxazole-trimethoprim (BACTRIM DS) 800-160 MG per tablet 1 tablet     1 tablet Oral Every 12 hours 10/22/19 1955 10/24/19 2159   10/20/19 0200  ceFEPIme (MAXIPIME) 2 g in sodium chloride 0.9 % 100 mL IVPB  Status:  Discontinued     2 g 200 mL/hr over 30 Minutes Intravenous Every 8 hours 10/19/19 1802 10/22/19 1857   10/20/19 0000  vancomycin (VANCOCIN) IVPB 1000 mg/200 mL premix  Status:  Discontinued     1,000 mg 200 mL/hr over 60 Minutes Intravenous Every 8 hours 10/19/19 1802 10/22/19 1039   10/19/19 1745  ceFEPIme (MAXIPIME) 2 g in sodium chloride 0.9 % 100 mL IVPB     2 g 200 mL/hr over 30 Minutes Intravenous  Once 10/19/19 1741 10/19/19 1831   10/19/19 1745  metroNIDAZOLE (FLAGYL) IVPB 500 mg     500 mg 100  mL/hr over 60 Minutes Intravenous  Once 10/19/19 1741 10/19/19 1933   10/19/19 1745  vancomycin (VANCOCIN) IVPB 1000 mg/200 mL premix     1,000 mg 200 mL/hr over 60 Minutes Intravenous  Once 10/19/19 1741 10/19/19 2050        Objective:   Vitals:   10/23/19 0600 10/23/19 0904 10/23/19 1317 10/23/19 1607  BP:  104/73 109/71   Pulse:  99 97   Resp:  20 20   Temp: 98.4 F (36.9 C) 98 F (36.7 C) 98.2 F (36.8 C) (!) 101.8 F (38.8 C)  TempSrc: Oral Oral Oral Rectal  SpO2:  95% 95%   Weight:      Height:        Wt Readings from Last 3 Encounters:  10/19/19 81.6 kg  10/16/19 81.6 kg  10/14/19  82.7 kg     Intake/Output Summary (Last 24 hours) at 10/23/2019 1842 Last data filed at 10/23/2019 1300 Gross per 24 hour  Intake 340 ml  Output 225 ml  Net 115 ml     Physical Exam  Gen:- Awake Alert,  In no apparent distress  HEENT:- [place].AT, No sclera icterus Nose- [place] 2L/min Neck-Supple Neck,No JVD,.  Lungs-diminished breath sounds with scattered rhonchi, CV- S1, S2 normal, regular , Port-A-Cath site is clean dry and intact Abd-  +ve B.Sounds, Abd Soft, No tenderness,    Extremity/Skin:- No  edema, pedal pulses present Psych-affect is appropriate, oriented x3 Neuro-generalized weakness and deconditioning, no new focal deficits, no tremors   Data Review:   Micro Results Recent Results (from the past 240 hour(s))  Urine culture     Status: None   Collection Time: 10/16/19  5:36 AM   Specimen: Urine, Clean Catch  Result Value Ref Range Status   Specimen Description   Final    URINE, CLEAN CATCH Performed at [facility], [place] [address], [place], [place] [address]    Special Requests   Final    NONE Performed at [facility], [place] [address] [place], [place] [address]    Culture   Final    NO GROWTH Performed at [facility], [place] [address], [place], [place] [address]    Report Status 10/17/2019 FINAL  Final  SARS  Coronavirus 2 by RT PCR (hospital order, performed in [facility] hospital lab) Nasopharyngeal Nasopharyngeal Swab     Status: None   Collection Time: 10/16/19  6:12 AM   Specimen: Nasopharyngeal Swab  Result Value Ref Range Status   SARS Coronavirus 2 NEGATIVE NEGATIVE Final    Comment: (NOTE) SARS-CoV-2 target nucleic acids are NOT DETECTED. The SARS-CoV-2 RNA is generally detectable in upper and lower respiratory specimens during the acute phase of infection. The lowest concentration of SARS-CoV-2 viral copies this assay can detect is 250 copies / mL. A negative result does not preclude SARS-CoV-2 infection and should not be used as the sole basis for treatment or other patient management decisions.  A negative result may occur with improper specimen collection / handling, submission of specimen other than nasopharyngeal swab, presence of viral mutation(s) within the areas targeted by this assay, and inadequate number of viral copies (<250 copies / mL). A negative result must be combined with clinical observations, patient history, and epidemiological information. Fact Sheet for Patients:   StrictlyIdeas.no Fact Sheet for Healthcare Providers: BankingDealers.co.za This test is not yet approved or cleared  by the [place] FDA and has been authorized for detection and/or diagnosis of SARS-CoV-2 by FDA under an Emergency Use Authorization (EUA).  This EUA will remain in effect (meaning this test can be used) for the duration of the COVID-19 declaration under Section 564(b)(1) of the Act, 21 U.S.C. section 360bbb-3(b)(1), unless the authorization is terminated or revoked sooner. Performed at [facility], [place] [address] [place], [place] [address]   Culture, blood (Routine x 2)     Status: None (Preliminary result)   Collection Time: 10/19/19  5:25 PM   Specimen: BLOOD LEFT ARM  Result Value Ref Range Status    Specimen Description BLOOD LEFT ARM  Final   Special Requests   Final    BOTTLES DRAWN AEROBIC AND ANAEROBIC Blood Culture adequate volume   Culture   Final    NO GROWTH 4 DAYS Performed at [facility] [place], [address], [place], [place] [address]    Report Status PENDING  Incomplete  Urine culture     Status: Abnormal   Collection Time: 10/19/19  5:41 PM   Specimen: In/Out Cath Urine  Result Value Ref Range Status   Specimen Description   Final    IN/OUT CATH URINE Performed at [facility], [address] [place], [place] [address]    Special Requests   Final    NONE Performed at [facility], [address] [place], [place] [address]    Culture (A)  Final    >=100,000 COLONIES/mL ENTEROCOCCUS FAECALIS >=100,000 COLONIES/mL STAPHYLOCOCCUS HAEMOLYTICUS    Report Status 10/22/2019 FINAL  Final   Organism ID, Bacteria ENTEROCOCCUS FAECALIS (A)  Final   Organism ID, Bacteria STAPHYLOCOCCUS HAEMOLYTICUS (A)  Final      Susceptibility   Enterococcus faecalis - MIC*    AMPICILLIN <=2 SENSITIVE Sensitive     NITROFURANTOIN <=16 SENSITIVE Sensitive     VANCOMYCIN 2 SENSITIVE Sensitive     * >=100,000 COLONIES/mL ENTEROCOCCUS FAECALIS   Staphylococcus haemolyticus - MIC*    CIPROFLOXACIN <=0.5 SENSITIVE Sensitive     GENTAMICIN <=0.5 SENSITIVE Sensitive     NITROFURANTOIN <=16 SENSITIVE Sensitive     OXACILLIN >=4 RESISTANT Resistant     TETRACYCLINE <=1 SENSITIVE Sensitive     VANCOMYCIN 1 SENSITIVE Sensitive     TRIMETH/SULFA <=10 SENSITIVE Sensitive     CLINDAMYCIN <=0.25 SENSITIVE Sensitive     RIFAMPIN <=0.5 SENSITIVE Sensitive     Inducible Clindamycin NEGATIVE Sensitive     * >=100,000 COLONIES/mL STAPHYLOCOCCUS HAEMOLYTICUS  Culture, blood (Routine x 2)     Status: None (Preliminary result)   Collection Time: 10/19/19  5:50 PM   Specimen: BLOOD LEFT WRIST  Result Value Ref Range Status   Specimen Description BLOOD LEFT WRIST  Final   Special Requests   Final     BOTTLES DRAWN AEROBIC AND ANAEROBIC Blood Culture adequate volume   Culture   Final    NO GROWTH 4 DAYS Performed at [facility], [address] [place], [place] [address]    Report Status PENDING  Incomplete  SARS Coronavirus 2 by RT PCR (hospital order, performed in [place] hospital lab) Nasopharyngeal Nasopharyngeal Swab     Status: None   Collection Time: 10/19/19  8:17 PM   Specimen: Nasopharyngeal Swab  Result Value Ref Range Status   SARS Coronavirus 2 NEGATIVE NEGATIVE Final    Comment: (NOTE) SARS-CoV-2 target nucleic acids are NOT DETECTED. The SARS-CoV-2 RNA is generally detectable in upper and lower respiratory specimens during the acute phase of infection. The lowest concentration of SARS-CoV-2 viral copies this assay can detect is 250 copies / mL. A negative result does not preclude SARS-CoV-2 infection and should not be used as the sole basis for treatment or other patient management decisions.  A negative result may occur with improper specimen collection / handling, submission of specimen other than nasopharyngeal swab, presence of viral mutation(s) within the areas targeted by this assay, and inadequate number of viral copies (<250 copies / mL). A negative result must be combined with clinical observations, patient history, and epidemiological information. Fact Sheet for Patients:   StrictlyIdeas.no Fact Sheet for Healthcare Providers: BankingDealers.co.za This test is not yet approved or cleared  by the [place] FDA and has been authorized for detection and/or diagnosis of SARS-CoV-2 by FDA under an Emergency Use Authorization (EUA).  This EUA will remain in effect (meaning this test can  be used) for the duration of the COVID-19 declaration under Section 564(b)(1) of the Act, 21 U.S.C. section 360bbb-3(b)(1), unless the authorization is terminated or revoked sooner. Performed at [facility], [address], [place], [place] [address]   MRSA PCR Screening     Status: None   Collection Time: 10/21/19  3:16 PM   Specimen: Nasal Mucosa; Nasopharyngeal  Result Value Ref Range Status   MRSA by PCR NEGATIVE NEGATIVE Final    Comment:        The GeneXpert MRSA Assay (FDA approved for NASAL specimens only), is one component of a comprehensive MRSA colonization surveillance program. It is not intended to diagnose MRSA infection nor to guide or monitor treatment for MRSA infections. Performed at [facility], [address], [place], [place] [address]     Radiology Reports DG Chest 2 View  Result Date: 10/16/2019 CLINICAL DATA:  Lung cancer and weakness EXAM: CHEST - 2 VIEW COMPARISON:  08/06/2019 CT.  PET-CT 09/08/2019 FINDINGS: Right lower chest opacification with volume loss from necrotic mass by comparison CT. Small to moderate right lateral pleural effusion has increased from prior. Left chest is clear. Stable heart size. Right port with tip at the SVC. IMPRESSION: Necrotic right lung mass as seen on recent staging scan. An overlying right pleural effusion has increased from April. Electronically Signed   By: Monte Fantasia M.D.   On: 10/16/2019 04:43   CT HEAD WO CONTRAST  Result Date: 10/16/2019 CLINICAL DATA:  Mental status change, unknown cause. Additional provided: Weakness, history of lung cancer EXAM: CT HEAD WITHOUT CONTRAST TECHNIQUE: Contiguous axial images were obtained from the base of the skull through the vertex without intravenous contrast. COMPARISON:  Brain MRI 08/10/2019 FINDINGS: Brain: Please note there is limited assessment for intracranial metastatic disease on this noncontrast CT study. Mild ill-defined hypoattenuation within the cerebral white matter is nonspecific, but consistent with chronic small vessel ischemic disease. Stable, mild generalized parenchymal atrophy. There is no acute intracranial hemorrhage. No demarcated cortical infarct. No extra-axial fluid collection.  No evidence of intracranial mass. No midline shift. Vascular: No hyperdense vessel.  Atherosclerotic calcifications Skull: Normal. Negative for fracture or focal lesion. Sinuses/Orbits: Visualized orbits show no acute finding. Paranasal sinus mucosal thickening, most notable within bilateral ethmoid air cells. No significant mastoid effusion. IMPRESSION: 1. Please note there is limited assessment for intracranial metastatic disease on this non-contrast CT study. 2. No evidence of acute intracranial abnormality. 3. Mild generalized parenchymal atrophy and chronic small vessel ischemic disease. 4. Paranasal sinus mucosal thickening, most notably ethmoid. Electronically Signed   By: Kellie Simmering DO   On: 10/16/2019 09:52   DG Chest Port 1 View  Result Date: 10/19/2019 CLINICAL DATA:  Fever and urinary complaints EXAM: PORTABLE CHEST 1 VIEW COMPARISON:  Radiograph 10/16/2019 FINDINGS: Likely increasing size of the complex right pleural effusion with associated passive atelectasis though some underlying consolidation is not excluded. A necrotic lung mass is again seen in the right middle lobe, better visualized on cross-sectional imaging. Left lung is predominantly clear aside from basilar atelectasis. Left apical scarring as well. Cardiomediastinal contours are unchanged from prior, partially obscured in the right lung base by overlying opacity. Right IJ Port-A-Cath tip terminates at the superior cavoatrial junction. Telemetry leads overlie the chest. Degenerative changes are present in the imaged spine and shoulders. IMPRESSION: 1. Likely increasing size of the complex right pleural effusion with associated passive atelectasis though some underlying consolidation is not excluded in the setting of fever.  2. Necrotic right lung mass, better visualized on cross-sectional imaging. Electronically Signed   By: Lovena Le M.D.   On: 10/19/2019 17:39   ECHOCARDIOGRAM COMPLETE  Result Date: 10/09/2019    ECHOCARDIOGRAM  REPORT   Patient Name:   TITUS DRONE Metheny Date of Exam: 10/09/2019 Medical Rec #:  [id number]     Height:       74.0 in Accession #:    [id number]    Weight:       179.6 lb Date of Birth:  1945-06-27     BSA:          2.076 m Patient Age:    69 years      BP:           117/71 mmHg Patient Gender: M             HR:           94 bpm. Exam Location:  [facility] Procedure: 2D Echo, Cardiac Doppler and Color Doppler Indications:     I48.0 PAF  History:         Patient has no prior history of Echocardiogram examinations.                  COPD and Stage IV lung cancer. Anemia, Arrythmias:Atrial                  Fibrillation; Risk Factors:Hypertension, Diabetes and Former                  Smoker.  Sonographer:     Jeneen Montgomery RDMS, RVT, RDCS Referring Phys:  [id number] Alphonse Guild BRANCH Diagnosing Phys: Kate Sable MD  Sonographer Comments: Technically difficult study due to poor echo windows. Image acquisition challenging due to COPD and Image acquisition challenging due to patient body habitus. IMPRESSIONS  1. Left ventricular ejection fraction, by estimation, is 60 to 65%. The left ventricle has normal function. Left ventricular endocardial border not optimally defined to evaluate regional wall motion. Left ventricular diastolic parameters are consistent with Grade I diastolic dysfunction (impaired relaxation).  2. Right ventricular systolic function is normal. The right ventricular size is moderately enlarged.  3. Right atrial size was mild to moderately dilated.  4. The mitral valve was not well visualized. Trivial mitral valve regurgitation.  5. Tricuspid valve regurgitation unable to assess.  6. The aortic valve was not well visualized. Aortic valve regurgitation is mild.  7. Pulmonic valve regurgitation unable to assess.  8. Aortic dilatation noted. There is mild dilatation of the aortic root. Conclusion(s)/Recommendation(s): Suboptimal study. Consider contrast enhancement for future studies. FINDINGS  Left Ventricle:  Left ventricular ejection fraction, by estimation, is 60 to 65%. The left ventricle has normal function. Left ventricular endocardial border not optimally defined to evaluate regional wall motion. The left ventricular internal cavity size was normal in size. There is no left ventricular hypertrophy. Left ventricular diastolic parameters are consistent with Grade I diastolic dysfunction (impaired relaxation). Normal left ventricular filling pressure. Right Ventricle: The right ventricular size is moderately enlarged. Right vetricular wall thickness was not assessed. Right ventricular systolic function is normal. Left Atrium: Left atrial size was normal in size. Right Atrium: Right atrial size was mild to moderately dilated. Pericardium: There is no evidence of pericardial effusion. Mitral Valve: The mitral valve was not well visualized. Trivial mitral valve regurgitation. Tricuspid Valve: The tricuspid valve is not well visualized. Tricuspid valve regurgitation unable to assess. Aortic Valve: The aortic valve was not well visualized.  Aortic valve regurgitation is mild. Pulmonic Valve: The pulmonic valve was not well visualized. Pulmonic valve regurgitation unable to assess. Aorta: Aortic dilatation noted. There is mild dilatation of the aortic root. Venous: The inferior vena cava was not well visualized. IAS/Shunts: The interatrial septum was not well visualized.  LEFT VENTRICLE PLAX 2D LVIDd:         3.88 cm      Diastology LVIDs:         2.68 cm      LV e' lateral:   5.62 cm/s LV PW:         0.96 cm      LV E/e' lateral: 11.1 LV IVS:        1.21 cm      LV e' medial:    7.86 cm/s LVOT diam:     2.00 cm      LV E/e' medial:  8.0 LV SV:         57 LV SV Index:   27 LVOT Area:     3.14 cm  LV Volumes (MOD) LV vol d, MOD A2C: 68.9 ml LV vol d, MOD A4C: 118.0 ml LV vol s, MOD A2C: 37.0 ml LV vol s, MOD A4C: 47.6 ml LV SV MOD A2C:     31.9 ml LV SV MOD A4C:     118.0 ml LV SV MOD BP:      54.6 ml RIGHT VENTRICLE RV S  prime:     14.50 cm/s TAPSE (M-mode): 2.8 cm LEFT ATRIUM             Index LA diam:        2.70 cm 1.30 cm/m LA Vol (A2C):   56.7 ml 27.31 ml/m LA Vol (A4C):   40.7 ml 19.60 ml/m LA Biplane Vol:         23.90 ml/m  AORTIC VALVE LVOT Vmax:   103.00 cm/s LVOT Vmean:  64.000 cm/s LVOT VTI:    0.181 m  AORTA Ao Root diam: 3.80 cm MITRAL VALVE               TRICUSPID VALVE MV Area (PHT):             TR Peak grad:   3.0 mmHg MV Decel Time: 143 msec    TR Vmax:        86.30 cm/s MR Peak grad: 21.8 mmHg MR Vmax:      233.50 cm/s  SHUNTS MV E velocity: 62.60 cm/s  Systemic VTI:  0.18 m MV A velocity: 70.80 cm/s  Systemic Diam: 2.00 cm MV E/A ratio:  0.88 Kate Sable MD Electronically signed by Kate Sable MD Signature Date/Time: 10/09/2019/5:58:14 PM    Final    DG HIP UNILAT WITH PELVIS 2-3 VIEWS RIGHT  Result Date: 10/18/2019 CLINICAL DATA:  Right hip pain for 5 days following fall, initial encounter EXAM: DG HIP (WITH OR WITHOUT PELVIS) 2-3V RIGHT COMPARISON:  None. 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 No acute fracture or dislocation is seen. No soft tissue abnormality is noted. IMPRESSION: Degenerative change without acute abnormality. Electronically Signed   By: Inez Catalina M.D.   On: 10/18/2019 12:21     CBC Recent Labs  Lab 10/17/19 0531 10/18/19 0404 10/19/19 1720 10/20/19 0605 10/20/19 2327  WBC 5.5 5.5 4.7 4.6 5.7  HGB 8.1* 8.0* 8.4* 7.2* 7.4*  HCT 26.5* 25.8* 27.4* 23.3* 23.0*  PLT 121* 131* 99* 74* 67*  MCV 95.3 94.9  97.2 95.5 93.9  MCH 29.1 29.4 29.8 29.5 30.2  MCHC 30.6 31.0 30.7 30.9 32.2  RDW 18.0* 17.7* 17.8* 17.7* 17.3*  LYMPHSABS  --   --  0.2* 0.3* 0.4*  MONOABS  --   --  0.1 0.2 0.2  EOSABS  --   --  0.0 0.0 0.0  BASOSABS  --   --  0.0 0.0 0.0    Chemistries  Recent Labs  Lab 10/17/19 0531 10/17/19 0531 10/18/19 0404 10/19/19 1720 10/20/19 0605 10/21/19 0541 10/22/19 0434  NA 131*   < > 132* 133* 132* 130* 135   134*  K 3.4*   < > 3.5 4.4 3.3* 3.2* 4.1  4.1  CL 94*   < > 97* 96* 95* 91* 99  99  CO2 28   < > 27 28 27 29 27  27   GLUCOSE 114*   < > 110* 161* 118* 141* 124*  123*  BUN 16   < > 15 16 12 12 12  12   CREATININE 0.59*   < > 0.54* 0.75 0.55* 0.69 0.64  0.59*  CALCIUM 7.6*   < > 7.6* 7.7* 7.8* 7.9* 8.1*  8.0*  MG 1.6*  --   --   --   --  1.5* 2.0  AST 19  --   --  16  --  19  --   ALT 18  --   --  23  --  21  --   ALKPHOS 60  --   --  57  --  51  --   BILITOT 0.6  --   --  0.5  --  0.8  --    < > = values in this interval not displayed.   ------------------------------------------------------------------------------------------------------------------ No results for input(s): CHOL, HDL, LDLCALC, TRIG, CHOLHDL, LDLDIRECT in the last 72 hours.  Lab Results  Component Value Date   HGBA1C 7.0 (H) 08/07/2019   ------------------------------------------------------------------------------------------------------------------ No results for input(s): TSH, T4TOTAL, T3FREE, THYROIDAB in the last 72 hours.  Invalid input(s): FREET3 ------------------------------------------------------------------------------------------------------------------ No results for input(s): VITAMINB12, FOLATE, FERRITIN, TIBC, IRON, RETICCTPCT in the last 72 hours.  Coagulation profile Recent Labs  Lab 10/19/19 1720  INR 1.0    No results for input(s): DDIMER in the last 72 hours.  Cardiac Enzymes No results for input(s): CKMB, TROPONINI, MYOGLOBIN in the last 168 hours.  Invalid input(s): CK ------------------------------------------------------------------------------------------------------------------    Component Value Date/Time   BNP 104.0 (H) 08/06/2019 1211     Roxan Hockey M.D on 10/23/2019 at 6:42 PM  Go to [email or web address] - for contact info  [facility] - Office  [phone]

## 2019-10-23 NOTE — Plan of Care (Signed)

## 2019-10-23 NOTE — Progress Notes (Signed)
Telemetry reviewed, sinus tach short runs of atach. Room with bp's to titrate dilt, increase to 90mg  every 6 hours. As systemic illness resolves tachy and ectopy should decrease. Titrate dilt up to 90mg  every 6 hours.   Carlyle Dolly MD

## 2019-10-24 LAB — RENAL FUNCTION PANEL
Albumin: 2.1 g/dL — ABNORMAL LOW (ref 3.5–5.0)
Anion gap: 11 (ref 5–15)
BUN: 13 mg/dL (ref 8–23)
CO2: 28 mmol/L (ref 22–32)
Calcium: 7.7 mg/dL — ABNORMAL LOW (ref 8.9–10.3)
Chloride: 94 mmol/L — ABNORMAL LOW (ref 98–111)
Creatinine, Ser: 0.85 mg/dL (ref 0.61–1.24)
GFR calc Af Amer: >60 mL/min (ref >60)
GFR calc non Af Amer: >60 mL/min (ref >60)
Glucose, Bld: 162 mg/dL — ABNORMAL HIGH (ref 70–99)
Phosphorus: 3.3 mg/dL (ref 2.5–4.6)
Potassium: 3.7 mmol/L (ref 3.5–5.1)
Sodium: 133 mmol/L — ABNORMAL LOW (ref 135–145)

## 2019-10-24 LAB — CULTURE, BLOOD (ROUTINE X 2)
Culture: NO GROWTH
Culture: NO GROWTH
Special Requests: ADEQUATE
Special Requests: ADEQUATE

## 2019-10-24 LAB — MAGNESIUM: Magnesium: 1.6 mg/dL — ABNORMAL LOW (ref 1.7–2.4)

## 2019-10-24 LAB — CBC
HCT: 29.1 % — ABNORMAL LOW (ref 39.0–52.0)
Hemoglobin: 9.3 g/dL — ABNORMAL LOW (ref 13.0–17.0)
MCH: 29.1 pg (ref 26.0–34.0)
MCHC: 32 g/dL (ref 30.0–36.0)
MCV: 90.9 fL (ref 80.0–100.0)
Platelets: 87 10*3/uL — ABNORMAL LOW (ref 150–400)
RBC: 3.2 MIL/uL — ABNORMAL LOW (ref 4.22–5.81)
RDW: 19 % — ABNORMAL HIGH (ref 11.5–15.5)
WBC: 3.7 10*3/uL — ABNORMAL LOW (ref 4.0–10.5)
nRBC: 0 % (ref 0.0–0.2)

## 2019-10-24 LAB — ABO/RH: ABO/RH(D): A POS

## 2019-10-24 LAB — GLUCOSE, CAPILLARY
Glucose-Capillary: 102 mg/dL — ABNORMAL HIGH (ref 70–99)
Glucose-Capillary: 169 mg/dL — ABNORMAL HIGH (ref 70–99)
Glucose-Capillary: 108 mg/dL — ABNORMAL HIGH (ref 70–99)
Glucose-Capillary: 156 mg/dL — ABNORMAL HIGH (ref 70–99)

## 2019-10-24 MED ORDER — DILTIAZEM HCL ER COATED BEADS 240 MG PO CP24
360.0000 mg | ORAL_CAPSULE | Freq: Every day | ORAL | Status: DC
  Administered 2019-10-25 – 2019-10-28 (×4): 360 mg via ORAL
  Filled 2019-10-24 (×4): qty 1

## 2019-10-24 NOTE — Progress Notes (Signed)
Per Dr. Nelly Laurence request, office APP f/u has been arranged and appt info placed on AVS.

## 2019-10-24 NOTE — Progress Notes (Signed)
Telemetry reviewed, primarily sinus tach. Short runs of SVT, overall infrequent. Would continue dilt at 90mg  every 6 hours, prior to discharge consolidate to 360mg  long acting daily. As acute systemic illness improves may be able to titrate down dilt dose.    We will sign off inpatient care. Please call us back if significant recurrent arrhythmias.   Carlyle Dolly MD

## 2019-10-24 NOTE — Progress Notes (Signed)
Patient Demographics:    Jeremy [name], is a 74 y.o. male, DOB - [birth year], ZOX:[id number]  Admit date - 10/19/2019   Admitting Physician [name], MD  Outpatient Primary MD for the patient is [name], [name], MD  LOS - 5   Chief Complaint  Patient presents with  . Code Sepsis        Subjective:    Jeremy [name] today has  no emesis,  No chest pain,    -Wife at bedside, had fevers yesterday but none today -Required transfusion of 2 units of PRBC overnight -Complains of lack of sleep due to transfusion overnight -Tachyarrhythmia more infrequent and less persistent  Assessment  & Plan :    Principal Problem:   Sepsis due to undetermined organism [facility]) Active Problems:   COPD (chronic obstructive pulmonary disease) (HCC)   Type 2 diabetes mellitus (HCC)   HTN (hypertension)   Anemia in neoplastic disease   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HCC)   Thrombocytopenia (HCC)  Brief Summary:- 74 y.o. male with medical history significant of right lung cancer, COPD, type 2 diabetes, hypertension, psoriatic arthritis, with status post palliative chemo 10/14/2019 admitted on 10/19/2019 with pneumonia concerns about sepsis -Found to have tachyarrhythmia  A/p  1)Sepsis of urinary source---   -Fevers on 10/23/2019, no fevers today -If remains afebrile may switch to oral regimen on 10/25/2019 -Urine culture from 10/19/2019 with Enterococcus and Staphylococcus hemolyticus -Blood cultures 2021 and repeat blood cultures from 10/23/2019 NGTD - stopped vancomycin as MRSA negative Antimicrobials this admission: 5/16 Cefepime >> 5/19 5/16 Vancomycin >> 5/19 5/16 Metronidazole x 1  5/19 Unasyn >> 5/19 TMP/SMX PO >>  Microbiology results: 5/16 BCx X 2: NGTD 5/16 UCx: >100,000 CFU/ml E. faecalis pan sensitive; >100,000 CFU/ml Staph haemolyticus susceptible to cipro, clinda, tetracycline,  vancomycin, TMP/SMX; resistant to oxacillin 5/16 COVID: negative 5/13 UCx: NG/final 5/18 MRSA PCR: negative  -Stopped IV cefepime, started Unasyn given fevers, as well as Enterococcus and Bactrim for staph in the urine -Watch renal function closely while on Bactrim -We will repeat blood cultures due to persistent fevers   2)DM2-recent A1c 7.0 reflecting fair diabetic control PTA -Continue Metformin and Use Novolog/Humalog Sliding scale insulin with Accu-Cheks/Fingersticks as ordered   3)Stage IV squamous cell carcinoma of right lung - Necrotic right lung mass, last palliative chemo on 10/14/2019--- [name] who is patient's oncologist notified of admission to the hospital -Keytruda stopped b/c of rash  4)Chronic Neoplastic Anemia and Thrombocytopenia  in the patient with underlying malignancy currently undergoing chemotherapy--- monitor closely and transfuse as clinically indicated -Platelets up to 76 from 67, no bleeding noted, hemoglobin down to 6.5 from 7.4--baseline Hgb usually above 8 -Transfused 2 units of PRBC on 10/23/2019 -Repeat CBC pending -- 5)HTN=-continue Cardizem  6)COPD----antibiotics as above #1, continue bronchodilators and mucolytics  7)Acute on chronic  Hypoxic Respiratory Failure--- PTA patient was on 2 L of oxygen at home, -- suspect due to #1 and #6 above as well as #3 -Oxygen requirement appears to be back to baseline at this time  8)PAFib/PAT/PSVT-- CHA2DS2- VASc score   is = 3 (HTN, DM and age)   Which is  equal to = 3.2 % annual risk of stroke -Patient is  not on anticoagulation due to chronic anemia requiring transfusion from time to time -Discussed with Dr. [name] from cardiology service, -Patient's EF is 60 to 65%, cardiologist advised  increasing Cardizem to 90 mg every 6 hours for better rate control  -Per cardiology okay to switch to Cardizem CD 360 mg daily--starting on 10/25/2019  9)HFpEF--patient with history of chronic diastolic dysfunction CHF  with preserved EF over 60 to 65%, appears euvolemic at this time --Rate control as above in #8  11) hypokalemia and hypomagnesemia----replaced and normalized =-  12) generalized weakness/deconditioning/ambulatory dysfunction--- physical therapy eval requested  Disposition/Need for in-Hospital Stay- patient unable to be discharged at this time due to ---sepsis secondary to suspected pulmonary etiology, requiring IV Unasyn--fevers persist -Also needs further adjustments of rate control medications -Patient From: home D/C Place: home with [facility] Barriers: Not Clinically Stable-   Code Status : DNR  Family Communication:   (patient is alert, awake and coherent) -Discussed with patient's wife at bedside  Consults  :  na DVT Prophylaxis  :   - SCDs low-platelets  Lab Results  Component Value Date   PLT 76 (L) 10/23/2019    Inpatient Medications  Scheduled Meds: . vitamin C  1,000 mg Oral Daily  . [START ON 10/25/2019] diltiazem  360 mg Oral Daily  . diltiazem  90 mg Oral Q6H  . ferrous sulfate  325 mg Oral Q supper  . folic acid  1 mg Oral Daily  . gabapentin  600 mg Oral QHS  . insulin aspart  0-15 Units Subcutaneous TID WC  . loratadine  10 mg Oral Daily  . melatonin  4.5 mg Oral QHS  . metFORMIN  1,000 mg Oral Q breakfast  . metFORMIN  500 mg Oral Q supper  . pantoprazole  40 mg Oral Daily  . phosphorus  500 mg Oral BID  . triamcinolone cream   Topical QID   Continuous Infusions: . ampicillin-sulbactam (UNASYN) IV 3 g (10/24/19 1020)   PRN Meds:.acetaminophen, levalbuterol, lidocaine-prilocaine, traMADol    Anti-infectives (From admission, onward)   Start     Dose/Rate Route Frequency Ordered Stop   10/22/19 2000  Ampicillin-Sulbactam (UNASYN) 3 g in sodium chloride 0.9 % 100 mL IVPB     3 g 200 mL/hr over 30 Minutes Intravenous Every 6 hours 10/22/19 1932     10/22/19 2000  sulfamethoxazole-trimethoprim (BACTRIM DS) 800-160 MG per tablet 1 tablet     1 tablet Oral  Every 12 hours 10/22/19 1955 10/24/19 1021   10/20/19 0200  ceFEPIme (MAXIPIME) 2 g in sodium chloride 0.9 % 100 mL IVPB  Status:  Discontinued     2 g 200 mL/hr over 30 Minutes Intravenous Every 8 hours 10/19/19 1802 10/22/19 1857   10/20/19 0000  vancomycin (VANCOCIN) IVPB 1000 mg/200 mL premix  Status:  Discontinued     1,000 mg 200 mL/hr over 60 Minutes Intravenous Every 8 hours 10/19/19 1802 10/22/19 1039   10/19/19 1745  ceFEPIme (MAXIPIME) 2 g in sodium chloride 0.9 % 100 mL IVPB     2 g 200 mL/hr over 30 Minutes Intravenous  Once 10/19/19 1741 10/19/19 1831   10/19/19 1745  metroNIDAZOLE (FLAGYL) IVPB 500 mg     500 mg 100 mL/hr over 60 Minutes Intravenous  Once 10/19/19 1741 10/19/19 1933   10/19/19 1745  vancomycin (VANCOCIN) IVPB 1000 mg/200 mL premix     1,000 mg 200 mL/hr over 60 Minutes Intravenous  Once 10/19/19 1741 10/19/19 2050  Objective:   Vitals:   10/24/19 0301 10/24/19 0543 10/24/19 0600 10/24/19 0900  BP: 129/78 129/68    Pulse: 60 (!) 101    Resp: 17  19   Temp: 98.2 F (36.8 C) 98.4 F (36.9 C)    TempSrc: Oral Oral    SpO2: (!) 58% 93%  95%  Weight:      Height:        Wt Readings from Last 3 Encounters:  10/19/19 81.6 kg  10/16/19 81.6 kg  10/14/19 82.7 kg     Intake/Output Summary (Last 24 hours) at 10/24/2019 1112 Last data filed at 10/24/2019 0900 Gross per 24 hour  Intake 1485.67 ml  Output 400 ml  Net 1085.67 ml     Physical Exam  Gen:- Awake Alert,  In no apparent distress  HEENT:- [place].AT, No sclera icterus Nose- [place] 2L/min Neck-Supple Neck,No JVD,.  Lungs-diminished breath sounds with scattered rhonchi, CV- S1, S2 normal, regular , Port-A-Cath site is clean dry and intact Abd-  +ve B.Sounds, Abd Soft, No tenderness,    Extremity/Skin:- No  edema, pedal pulses present Psych-affect is appropriate, oriented x3 Neuro-generalized weakness and deconditioning, no new focal deficits, no tremors   Data Review:   Micro  Results Recent Results (from the past 240 hour(s))  Urine culture     Status: None   Collection Time: 10/16/19  5:36 AM   Specimen: Urine, Clean Catch  Result Value Ref Range Status   Specimen Description   Final    URINE, CLEAN CATCH Performed at [facility], [place] [address] [place], [place] [address]    Special Requests   Final    NONE Performed at [facility], [place] [address] [place], [place] [address]    Culture   Final    NO GROWTH Performed at [facility], [place] [address], [place], [place] [address]    Report Status 10/17/2019 FINAL  Final  SARS Coronavirus 2 by RT PCR (hospital order, performed in [facility] hospital lab) Nasopharyngeal Nasopharyngeal Swab     Status: None   Collection Time: 10/16/19  6:12 AM   Specimen: Nasopharyngeal Swab  Result Value Ref Range Status   SARS Coronavirus 2 NEGATIVE NEGATIVE Final    Comment: (NOTE) SARS-CoV-2 target nucleic acids are NOT DETECTED. The SARS-CoV-2 RNA is generally detectable in upper and lower respiratory specimens during the acute phase of infection. The lowest concentration of SARS-CoV-2 viral copies this assay can detect is 250 copies / mL. A negative result does not preclude SARS-CoV-2 infection and should not be used as the sole basis for treatment or other patient management decisions.  A negative result may occur with improper specimen collection / handling, submission of specimen other than nasopharyngeal swab, presence of viral mutation(s) within the areas targeted by this assay, and inadequate number of viral copies (<250 copies / mL). A negative result must be combined with clinical observations, patient history, and epidemiological information. Fact Sheet for Patients:   StrictlyIdeas.no Fact Sheet for Healthcare Providers: BankingDealers.co.za This test is not yet approved or cleared  by the [place] FDA  and has been authorized for detection and/or diagnosis of SARS-CoV-2 by FDA under an Emergency Use Authorization (EUA).  This EUA will remain in effect (meaning this test can be used) for the duration of the COVID-19 declaration under Section 564(b)(1) of the Act, 21 U.S.C. section 360bbb-3(b)(1), unless the authorization is terminated or revoked sooner. Performed at [facility],  [place] [address] [place], [place] [address]   Culture, blood (Routine x 2)     Status: None   Collection Time: 10/19/19  5:25 PM   Specimen: BLOOD LEFT ARM  Result Value Ref Range Status   Specimen Description BLOOD LEFT ARM  Final   Special Requests   Final    BOTTLES DRAWN AEROBIC AND ANAEROBIC Blood Culture adequate volume   Culture   Final    NO GROWTH 5 DAYS Performed at [facility], [address] [place], [place] [address]    Report Status 10/24/2019 FINAL  Final  Urine culture     Status: Abnormal   Collection Time: 10/19/19  5:41 PM   Specimen: In/Out Cath Urine  Result Value Ref Range Status   Specimen Description   Final    IN/OUT CATH URINE Performed at [facility], [address], [place], [place] [address]    Special Requests   Final    NONE Performed at [facility], [address], [place], [place] [address]    Culture (A)  Final    >=100,000 COLONIES/mL ENTEROCOCCUS FAECALIS >=100,000 COLONIES/mL STAPHYLOCOCCUS HAEMOLYTICUS    Report Status 10/22/2019 FINAL  Final   Organism ID, Bacteria ENTEROCOCCUS FAECALIS (A)  Final   Organism ID, Bacteria STAPHYLOCOCCUS HAEMOLYTICUS (A)  Final      Susceptibility   Enterococcus faecalis - MIC*    AMPICILLIN <=2 SENSITIVE Sensitive     NITROFURANTOIN <=16 SENSITIVE Sensitive     VANCOMYCIN 2 SENSITIVE Sensitive     * >=100,000 COLONIES/mL ENTEROCOCCUS FAECALIS   Staphylococcus haemolyticus - MIC*    CIPROFLOXACIN <=0.5 SENSITIVE Sensitive     GENTAMICIN <=0.5 SENSITIVE Sensitive     NITROFURANTOIN <=16 SENSITIVE  Sensitive     OXACILLIN >=4 RESISTANT Resistant     TETRACYCLINE <=1 SENSITIVE Sensitive     VANCOMYCIN 1 SENSITIVE Sensitive     TRIMETH/SULFA <=10 SENSITIVE Sensitive     CLINDAMYCIN <=0.25 SENSITIVE Sensitive     RIFAMPIN <=0.5 SENSITIVE Sensitive     Inducible Clindamycin NEGATIVE Sensitive     * >=100,000 COLONIES/mL STAPHYLOCOCCUS HAEMOLYTICUS  Culture, blood (Routine x 2)     Status: None   Collection Time: 10/19/19  5:50 PM   Specimen: BLOOD LEFT WRIST  Result Value Ref Range Status   Specimen Description BLOOD LEFT WRIST  Final   Special Requests   Final    BOTTLES DRAWN AEROBIC AND ANAEROBIC Blood Culture adequate volume   Culture   Final    NO GROWTH 5 DAYS Performed at [facility], [address], [place], [place] [address]    Report Status 10/24/2019 FINAL  Final  SARS Coronavirus 2 by RT PCR (hospital order, performed in [facility] hospital lab) Nasopharyngeal Nasopharyngeal Swab     Status: None   Collection Time: 10/19/19  8:17 PM   Specimen: Nasopharyngeal Swab  Result Value Ref Range Status   SARS Coronavirus 2 NEGATIVE NEGATIVE Final    Comment: (NOTE) SARS-CoV-2 target nucleic acids are NOT DETECTED. The SARS-CoV-2 RNA is generally detectable in upper and lower respiratory specimens during the acute phase of infection. The lowest concentration of SARS-CoV-2 viral copies this assay can detect is 250 copies / mL. A negative result does not preclude SARS-CoV-2 infection and should not be used as the sole basis for treatment or other patient management decisions.  A negative result may occur with improper specimen collection / handling, submission of specimen other than nasopharyngeal swab, presence of viral mutation(s)  within the areas targeted by this assay, and inadequate number of viral copies (<250 copies / mL). A negative result must be combined with clinical observations, patient history, and epidemiological information. Fact Sheet for Patients:    StrictlyIdeas.no Fact Sheet for Healthcare Providers: BankingDealers.co.za This test is not yet approved or cleared  by the [place] FDA and has been authorized for detection and/or diagnosis of SARS-CoV-2 by FDA under an Emergency Use Authorization (EUA).  This EUA will remain in effect (meaning this test can be used) for the duration of the COVID-19 declaration under Section 564(b)(1) of the Act, 21 U.S.C. section 360bbb-3(b)(1), unless the authorization is terminated or revoked sooner. Performed at [facility], [address] [place], [place] [address]   MRSA PCR Screening     Status: None   Collection Time: 10/21/19  3:16 PM   Specimen: Nasal Mucosa; Nasopharyngeal  Result Value Ref Range Status   MRSA by PCR NEGATIVE NEGATIVE Final    Comment:        The GeneXpert MRSA Assay (FDA approved for NASAL specimens only), is one component of a comprehensive MRSA colonization surveillance program. It is not intended to diagnose MRSA infection nor to guide or monitor treatment for MRSA infections. Performed at [facility], [address], [place], [place] [address]   Culture, blood (Routine X 2) w Reflex to ID Panel     Status: None (Preliminary result)   Collection Time: 10/23/19  8:16 PM   Specimen: BLOOD LEFT HAND  Result Value Ref Range Status   Specimen Description BLOOD LEFT HAND  Final   Special Requests   Final    Blood Culture adequate volume BOTTLES DRAWN AEROBIC ONLY   Culture   Final    NO GROWTH < 12 HOURS Performed at [facility], [address] [place], [place] [address]    Report Status PENDING  Incomplete  Culture, blood (Routine X 2) w Reflex to ID Panel     Status: None (Preliminary result)   Collection Time: 10/23/19  8:17 PM   Specimen: BLOOD LEFT WRIST  Result Value Ref Range Status   Specimen Description BLOOD LEFT WRIST  Final   Special Requests   Final    BOTTLES DRAWN AEROBIC AND ANAEROBIC  Blood Culture adequate volume   Culture   Final    NO GROWTH < 12 HOURS Performed at [facility], [address] [place], [place] [address]    Report Status PENDING  Incomplete    Radiology Reports DG Chest 2 View  Result Date: 10/16/2019 CLINICAL DATA:  Lung cancer and weakness EXAM: CHEST - 2 VIEW COMPARISON:  08/06/2019 CT.  PET-CT 09/08/2019 FINDINGS: Right lower chest opacification with volume loss from necrotic mass by comparison CT. Small to moderate right lateral pleural effusion has increased from prior. Left chest is clear. Stable heart size. Right port with tip at the SVC. IMPRESSION: Necrotic right lung mass as seen on recent staging scan. An overlying right pleural effusion has increased from April. Electronically Signed   By: Monte Fantasia M.D.   On: 10/16/2019 04:43   CT HEAD WO CONTRAST  Result Date: 10/16/2019 CLINICAL DATA:  Mental status change, unknown cause. Additional provided: Weakness, history of lung cancer EXAM: CT HEAD WITHOUT CONTRAST TECHNIQUE: Contiguous axial images were obtained from the base of the skull through the vertex without intravenous contrast. COMPARISON:  Brain MRI 08/10/2019 FINDINGS: Brain: Please note there is limited assessment for intracranial metastatic disease on this noncontrast CT study.  Mild ill-defined hypoattenuation within the cerebral white matter is nonspecific, but consistent with chronic small vessel ischemic disease. Stable, mild generalized parenchymal atrophy. There is no acute intracranial hemorrhage. No demarcated cortical infarct. No extra-axial fluid collection. No evidence of intracranial mass. No midline shift. Vascular: No hyperdense vessel.  Atherosclerotic calcifications Skull: Normal. Negative for fracture or focal lesion. Sinuses/Orbits: Visualized orbits show no acute finding. Paranasal sinus mucosal thickening, most notable within bilateral ethmoid air cells. No significant mastoid effusion. IMPRESSION: 1. Please note there  is limited assessment for intracranial metastatic disease on this non-contrast CT study. 2. No evidence of acute intracranial abnormality. 3. Mild generalized parenchymal atrophy and chronic small vessel ischemic disease. 4. Paranasal sinus mucosal thickening, most notably ethmoid. Electronically Signed   By: Kellie Simmering DO   On: 10/16/2019 09:52   DG Chest Port 1 View  Result Date: 10/19/2019 CLINICAL DATA:  Fever and urinary complaints EXAM: PORTABLE CHEST 1 VIEW COMPARISON:  Radiograph 10/16/2019 FINDINGS: Likely increasing size of the complex right pleural effusion with associated passive atelectasis though some underlying consolidation is not excluded. A necrotic lung mass is again seen in the right middle lobe, better visualized on cross-sectional imaging. Left lung is predominantly clear aside from basilar atelectasis. Left apical scarring as well. Cardiomediastinal contours are unchanged from prior, partially obscured in the right lung base by overlying opacity. Right IJ Port-A-Cath tip terminates at the superior cavoatrial junction. Telemetry leads overlie the chest. Degenerative changes are present in the imaged spine and shoulders. IMPRESSION: 1. Likely increasing size of the complex right pleural effusion with associated passive atelectasis though some underlying consolidation is not excluded in the setting of fever. 2. Necrotic right lung mass, better visualized on cross-sectional imaging. Electronically Signed   By: Lovena Le M.D.   On: 10/19/2019 17:39   ECHOCARDIOGRAM COMPLETE  Result Date: 10/09/2019    ECHOCARDIOGRAM REPORT   Patient Name:   TYSE AURIEMMA Alejandro Date of Exam: 10/09/2019 Medical Rec #:  [id number]     Height:       74.0 in Accession #:    [id number]    Weight:       179.6 lb Date of Birth:  [birth year]     BSA:          2.076 m Patient Age:    66 years      BP:           117/71 mmHg Patient Gender: M             HR:           94 bpm. Exam Location:  [place] Procedure: 2D Echo, Cardiac  Doppler and Color Doppler Indications:     I48.0 PAF  History:         Patient has no prior history of Echocardiogram examinations.                  COPD and Stage IV lung cancer. Anemia, Arrythmias:Atrial                  Fibrillation; Risk Factors:Hypertension, Diabetes and Former                  Smoker.  Sonographer:     Jeneen Montgomery RDMS, RVT, RDCS Referring Phys:  [id number] Alphonse Guild BRANCH Diagnosing Phys: Kate Sable MD  Sonographer Comments: Technically difficult study due to poor echo windows. Image acquisition challenging due to COPD and Image acquisition challenging due to patient body habitus.  IMPRESSIONS  1. Left ventricular ejection fraction, by estimation, is 60 to 65%. The left ventricle has normal function. Left ventricular endocardial border not optimally defined to evaluate regional wall motion. Left ventricular diastolic parameters are consistent with Grade I diastolic dysfunction (impaired relaxation).  2. Right ventricular systolic function is normal. The right ventricular size is moderately enlarged.  3. Right atrial size was mild to moderately dilated.  4. The mitral valve was not well visualized. Trivial mitral valve regurgitation.  5. Tricuspid valve regurgitation unable to assess.  6. The aortic valve was not well visualized. Aortic valve regurgitation is mild.  7. Pulmonic valve regurgitation unable to assess.  8. Aortic dilatation noted. There is mild dilatation of the aortic root. Conclusion(s)/Recommendation(s): Suboptimal study. Consider contrast enhancement for future studies. FINDINGS  Left Ventricle: Left ventricular ejection fraction, by estimation, is 60 to 65%. The left ventricle has normal function. Left ventricular endocardial border not optimally defined to evaluate regional wall motion. The left ventricular internal cavity size was normal in size. There is no left ventricular hypertrophy. Left ventricular diastolic parameters are consistent with Grade I diastolic  dysfunction (impaired relaxation). Normal left ventricular filling pressure. Right Ventricle: The right ventricular size is moderately enlarged. Right vetricular wall thickness was not assessed. Right ventricular systolic function is normal. Left Atrium: Left atrial size was normal in size. Right Atrium: Right atrial size was mild to moderately dilated. Pericardium: There is no evidence of pericardial effusion. Mitral Valve: The mitral valve was not well visualized. Trivial mitral valve regurgitation. Tricuspid Valve: The tricuspid valve is not well visualized. Tricuspid valve regurgitation unable to assess. Aortic Valve: The aortic valve was not well visualized. Aortic valve regurgitation is mild. Pulmonic Valve: The pulmonic valve was not well visualized. Pulmonic valve regurgitation unable to assess. Aorta: Aortic dilatation noted. There is mild dilatation of the aortic root. Venous: The inferior vena cava was not well visualized. IAS/Shunts: The interatrial septum was not well visualized.  LEFT VENTRICLE PLAX 2D LVIDd:         3.88 cm      Diastology LVIDs:         2.68 cm      LV e' lateral:   5.62 cm/s LV PW:         0.96 cm      LV E/e' lateral: 11.1 LV IVS:        1.21 cm      LV e' medial:    7.86 cm/s LVOT diam:     2.00 cm      LV E/e' medial:  8.0 LV SV:         57 LV SV Index:   27 LVOT Area:     3.14 cm  LV Volumes (MOD) LV vol d, MOD A2C: 68.9 ml LV vol d, MOD A4C: 118.0 ml LV vol s, MOD A2C: 37.0 ml LV vol s, MOD A4C: 47.6 ml LV SV MOD A2C:     31.9 ml LV SV MOD A4C:     118.0 ml LV SV MOD BP:      54.6 ml RIGHT VENTRICLE RV S prime:     14.50 cm/s TAPSE (M-mode): 2.8 cm LEFT ATRIUM             Index LA diam:        2.70 cm 1.30 cm/m LA Vol (A2C):   56.7 ml 27.31 ml/m LA Vol (A4C):   40.7 ml 19.60 ml/m LA Biplane Vol:  23.90 ml/m  AORTIC VALVE LVOT Vmax:   103.00 cm/s LVOT Vmean:  64.000 cm/s LVOT VTI:    0.181 m  AORTA Ao Root diam: 3.80 cm MITRAL VALVE               TRICUSPID VALVE MV  Area (PHT):             TR Peak grad:   3.0 mmHg MV Decel Time: 143 msec    TR Vmax:        86.30 cm/s MR Peak grad: 21.8 mmHg MR Vmax:      233.50 cm/s  SHUNTS MV E velocity: 62.60 cm/s  Systemic VTI:  0.18 m MV A velocity: 70.80 cm/s  Systemic Diam: 2.00 cm MV E/A ratio:  0.88 Kate Sable MD Electronically signed by Kate Sable MD Signature Date/Time: 10/09/2019/5:58:14 PM    Final    DG HIP UNILAT WITH PELVIS 2-3 VIEWS RIGHT  Result Date: 10/18/2019 CLINICAL DATA:  Right hip pain for 5 days following fall, initial encounter EXAM: DG HIP (WITH OR WITHOUT PELVIS) 2-3V RIGHT COMPARISON:  None. 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 No acute fracture or dislocation is seen. No soft tissue abnormality is noted. IMPRESSION: Degenerative change without acute abnormality. Electronically Signed   By: Inez Catalina M.D.   On: 10/18/2019 12:21     CBC Recent Labs  Lab 10/18/19 0404 10/19/19 1720 10/20/19 0605 10/20/19 2327 10/23/19 1924  WBC 5.5 4.7 4.6 5.7 3.4*  HGB 8.0* 8.4* 7.2* 7.4* 6.5*  HCT 25.8* 27.4* 23.3* 23.0* 21.1*  PLT 131* 99* 74* 67* 76*  MCV 94.9 97.2 95.5 93.9 95.9  MCH 29.4 29.8 29.5 30.2 29.5  MCHC 31.0 30.7 30.9 32.2 30.8  RDW 17.7* 17.8* 17.7* 17.3* 17.3*  LYMPHSABS  --  0.2* 0.3* 0.4* 0.3*  MONOABS  --  0.1 0.2 0.2 0.2  EOSABS  --  0.0 0.0 0.0 0.0  BASOSABS  --  0.0 0.0 0.0 0.0    Chemistries  Recent Labs  Lab 10/18/19 0404 10/19/19 1720 10/20/19 0605 10/21/19 0541 10/22/19 0434  NA 132* 133* 132* 130* 135  134*  K 3.5 4.4 3.3* 3.2* 4.1  4.1  CL 97* 96* 95* 91* 99  99  CO2 27 28 27 29 27  27   GLUCOSE 110* 161* 118* 141* 124*  123*  BUN 15 16 12 12 12  12   CREATININE 0.54* 0.75 0.55* 0.69 0.64  0.59*  CALCIUM 7.6* 7.7* 7.8* 7.9* 8.1*  8.0*  MG  --   --   --  1.5* 2.0  AST  --  16  --  19  --   ALT  --  23  --  21  --   ALKPHOS  --  57  --  51  --   BILITOT  --  0.5  --  0.8  --     ------------------------------------------------------------------------------------------------------------------ No results for input(s): CHOL, HDL, LDLCALC, TRIG, CHOLHDL, LDLDIRECT in the last 72 hours.  Lab Results  Component Value Date   HGBA1C 7.0 (H) 08/07/2019   ------------------------------------------------------------------------------------------------------------------ No results for input(s): TSH, T4TOTAL, T3FREE, THYROIDAB in the last 72 hours.  Invalid input(s): FREET3 ------------------------------------------------------------------------------------------------------------------ No results for input(s): VITAMINB12, FOLATE, FERRITIN, TIBC, IRON, RETICCTPCT in the last 72 hours.  Coagulation profile Recent Labs  Lab 10/19/19 1720  INR 1.0    No results for input(s): DDIMER in the last 72 hours.  Cardiac Enzymes No results for input(s): CKMB,  TROPONINI, MYOGLOBIN in the last 168 hours.  Invalid input(s): CK ------------------------------------------------------------------------------------------------------------------    Component Value Date/Time   BNP 104.0 (H) 08/06/2019 1211   Roxan Hockey M.D on 10/24/2019 at 11:12 AM  Go to [email or web address] - for contact info  [facility] - Office  [phone]

## 2019-10-24 NOTE — Plan of Care (Signed)
  Problem: Acute Rehab PT Goals(only PT should resolve) Goal: Pt Will Go Supine/Side To Sit Outcome: Progressing Flowsheets (Taken 10/24/2019 1209) Pt will go Supine/Side to Sit: with min guard assist Goal: Patient Will Transfer Sit To/From Stand Outcome: Progressing Flowsheets (Taken 10/24/2019 1209) Patient will transfer sit to/from stand: with min guard assist Goal: Pt Will Transfer Bed To Chair/Chair To Bed Outcome: Progressing Flowsheets (Taken 10/24/2019 1209) Pt will Transfer Bed to Chair/Chair to Bed: min guard assist Goal: Pt Will Ambulate Outcome: Progressing Flowsheets (Taken 10/24/2019 1209) Pt will Ambulate:  with min guard assist  > 125 feet  with least restrictive assistive device  12:10 PM, 10/24/19 Mearl Latin PT, DPT Physical Therapist at [facility]

## 2019-10-24 NOTE — Evaluation (Signed)
Physical Therapy Evaluation Patient Details Name: Jeremy [name] MRN: [id number] DOB: September 12, 1945 Today's Date: 10/24/2019   History of Present Illness  Jeremy [name] is a 74 y.o. male with medical history significant of right lung cancer, COPD, type 2 diabetes, hypertension, psoriatic arthritis  who was discharged from [facility] after being admitted from 10/15/2019 until 10/17/2019 due to generalized weakness and fall.  He is coming today to [facility] due to fever and frequency without hematuria.  He has also have mild confusion.  He denies headache, sore throat, rhinorrhea, hemoptysis, but has occasional wheezing and very frequent productive cough of whitish sputum since he was diagnosed with the lung mass.  He denies chest pain, palpitations, dizziness, diaphoresis, PND, orthopnea or pitting edema lower extremities.  No he had just undergone his third cycle of palliative chemotherapy on 10/14/2019.  He has a generalized macular papular rash due to [facility], which has been discontinued.    Clinical Impression  Patient limited for functional mobility as stated below secondary to BLE weakness, fatigue and poor standing balance. He requires assist to pull to sit to transition to sitting EOB. He demonstrates good sitting tolerance/balance during session today. He requires physical assist to transfer to standing using RW. Patient requires some verbal cueing for proper RW use while ambulating without loss of balance. Assist for balance and safety while ambulating without supplemental O2 and he becomes min/mod SOB following which improves quickly with rest.  Patient will benefit from continued physical therapy in hospital and recommended venue below to increase strength, balance, endurance for safe ADLs and gait.     Follow Up Recommendations Home health PT    Equipment Recommendations  Rolling walker with 5" wheels    Recommendations for Other Services       Precautions /  Restrictions Precautions Precautions: Fall Restrictions Weight Bearing Restrictions: No      Mobility  Bed Mobility Overal bed mobility: Needs Assistance Bed Mobility: Supine to Sit;Sit to Supine     Supine to sit: Min guard;Min assist Sit to supine: Supervision   General bed mobility comments: Min/ mod  assist for hand hold to pull up into sitting EOB  Transfers   Equipment used: Rolling walker (2 wheeled) Transfers: Sit to/from Stand Sit to Stand: Min assist         General transfer comment: min assist to tranfer to standing with RW from bed, unsteady upon standing which improves with RW  Ambulation/Gait Ambulation/Gait assistance: Min assist;Min guard Gait Distance (Feet): 100 Feet Assistive device: Rolling walker (2 wheeled) Gait Pattern/deviations: Step-through pattern;Decreased stride length Gait velocity: decreased   General Gait Details: unsteady gait with RW, min verbal cueing for keeping RW on ground and staying within RW, becomes min/mod SOB without supplemental O2  Stairs            Wheelchair Mobility    Modified Rankin (Stroke Patients Only)       Balance Overall balance assessment: Needs assistance Sitting-balance support: Feet supported Sitting balance-Leahy Scale: Good     Standing balance support: During functional activity;Bilateral upper extremity supported Standing balance-Leahy Scale: Fair                               Pertinent Vitals/Pain Pain Assessment: No/denies pain    Home Living Family/patient expects to be discharged to:: Private residence Living Arrangements: Spouse/significant other Available Help at Discharge: Family Type of Home: House Home Access:  Stairs to enter Entrance Stairs-Rails: None Entrance Stairs-Number of Steps: 2 in garage; 4-5 steps at back Home Layout: One level Home Equipment: Cane - single point;Shower seat - built in;Grab bars - tub/shower;Toilet riser;Other (comment)(power lift  chair) Additional Comments: on home O2    Prior Function Level of Independence: Needs assistance   Gait / Transfers Assistance Needed: Patient states household ambulation  ADL's / Homemaking Assistance Needed: mostly independent, wife assists occasionally        Hand Dominance   Dominant Hand: Right    Extremity/Trunk Assessment   Upper Extremity Assessment Upper Extremity Assessment: Generalized weakness    Lower Extremity Assessment Lower Extremity Assessment: Generalized weakness    Cervical / Trunk Assessment Cervical / Trunk Assessment: Normal  Communication   Communication: No difficulties  Cognition Arousal/Alertness: Awake/alert Behavior During Therapy: WFL for tasks assessed/performed Overall Cognitive Status: Within Functional Limits for tasks assessed                                        General Comments      Exercises     Assessment/Plan    PT Assessment Patient needs continued PT services  PT Problem List Decreased strength;Decreased activity tolerance;Decreased balance;Decreased mobility;Decreased knowledge of use of DME       PT Treatment Interventions DME instruction;Gait training;Stair training;Functional mobility training;Therapeutic activities;Therapeutic exercise;Balance training;Patient/family education;Neuromuscular re-education    PT Goals (Current goals can be found in the Care Plan section)  Acute Rehab PT Goals Patient Stated Goal: To go home PT Goal Formulation: With patient Time For Goal Achievement: 10/31/19 Potential to Achieve Goals: Good    Frequency Min 3X/week   Barriers to discharge        Co-evaluation               AM-PAC PT "6 Clicks" Mobility  Outcome Measure Help needed turning from your back to your side while in a flat bed without using bedrails?: None Help needed moving from lying on your back to sitting on the side of a flat bed without using bedrails?: A Little Help needed moving  to and from a bed to a chair (including a wheelchair)?: A Little Help needed standing up from a chair using your arms (e.g., wheelchair or bedside chair)?: A Little Help needed to walk in hospital room?: A Little Help needed climbing 3-5 steps with a railing? : A Little 6 Click Score: 19    End of Session Equipment Utilized During Treatment: Gait belt;Oxygen Activity Tolerance: Patient tolerated treatment well Patient left: in bed;with call bell/phone within reach;with nursing/sitter in room;with family/visitor present Nurse Communication: Mobility status PT Visit Diagnosis: Unsteadiness on feet (R26.81);History of falling (Z91.81);Difficulty in walking, not elsewhere classified (R26.2)    Time: 9147-8295 PT Time Calculation (min) (ACUTE ONLY): 21 min   Charges:   PT Evaluation $PT Eval Moderate Complexity: 1 Mod PT Treatments $Therapeutic Activity: 8-22 mins        12:07 PM, 10/24/19 Mearl Latin PT, DPT Physical Therapist at [facility]

## 2019-10-24 NOTE — TOC Progression Note (Signed)
Transition of Care [facility]) - Progression Note    Patient Details  Name: Jeremy [name] MRN: [id number] Date of Birth: [birth year]  [facility] CM/SW Contact  Shade Flood, LCSW Phone Number: 10/24/2019, 1:23 PM  Clinical Narrative:     TOC following. PT has seen pt and recommendation is for [facility] PT at dc. Pt was active with [facility] for PT/OT prior to admission. Asked MD for resumption orders at dc. Assigned TOC will update [facility] staff when pt stable for dc.  Expected Discharge Plan: [place] Barriers to Discharge: Continued Medical Work up  Expected Discharge Plan and Services Expected Discharge Plan: [place] In-house Referral: Clinical Social Work     Living arrangements for the past 2 months: Single Family Home                                       Social Determinants of Health (SDOH) Interventions    Readmission Risk Interventions Readmission Risk Prevention Plan 10/20/2019  Transportation Screening Complete  HRI or [place] Complete  Social Work Consult for [place] Planning/Counseling Complete  Palliative Care Screening Not Applicable  Medication Review [employer]) Complete  Some recent data might be hidden

## 2019-10-25 LAB — CBC
HCT: 28.7 % — ABNORMAL LOW (ref 39.0–52.0)
Hemoglobin: 8.8 g/dL — ABNORMAL LOW (ref 13.0–17.0)
MCH: 28.3 pg (ref 26.0–34.0)
MCHC: 30.7 g/dL (ref 30.0–36.0)
MCV: 92.3 fL (ref 80.0–100.0)
Platelets: 84 10*3/uL — ABNORMAL LOW (ref 150–400)
RBC: 3.11 MIL/uL — ABNORMAL LOW (ref 4.22–5.81)
RDW: 18.6 % — ABNORMAL HIGH (ref 11.5–15.5)
WBC: 2.9 10*3/uL — ABNORMAL LOW (ref 4.0–10.5)
nRBC: 0 % (ref 0.0–0.2)

## 2019-10-25 LAB — MAGNESIUM: Magnesium: 1.6 mg/dL — ABNORMAL LOW (ref 1.7–2.4)

## 2019-10-25 LAB — BASIC METABOLIC PANEL
Anion gap: 13 (ref 5–15)
BUN: 13 mg/dL (ref 8–23)
CO2: 26 mmol/L (ref 22–32)
Calcium: 7.6 mg/dL — ABNORMAL LOW (ref 8.9–10.3)
Chloride: 94 mmol/L — ABNORMAL LOW (ref 98–111)
Creatinine, Ser: 0.75 mg/dL (ref 0.61–1.24)
GFR calc Af Amer: >60 mL/min (ref >60)
GFR calc non Af Amer: >60 mL/min (ref >60)
Glucose, Bld: 197 mg/dL — ABNORMAL HIGH (ref 70–99)
Potassium: 3.8 mmol/L (ref 3.5–5.1)
Sodium: 133 mmol/L — ABNORMAL LOW (ref 135–145)

## 2019-10-25 LAB — GLUCOSE, CAPILLARY
Glucose-Capillary: 95 mg/dL (ref 70–99)
Glucose-Capillary: 167 mg/dL — ABNORMAL HIGH (ref 70–99)
Glucose-Capillary: 169 mg/dL — ABNORMAL HIGH (ref 70–99)
Glucose-Capillary: 154 mg/dL — ABNORMAL HIGH (ref 70–99)

## 2019-10-25 MED ORDER — SODIUM CHLORIDE 0.9% FLUSH
3.0000 mL | Freq: Two times a day (BID) | INTRAVENOUS | Status: DC
  Administered 2019-10-25 – 2019-10-27 (×3): 3 mL via INTRAVENOUS

## 2019-10-25 MED ORDER — METOPROLOL TARTRATE 25 MG PO TABS
25.0000 mg | ORAL_TABLET | Freq: Two times a day (BID) | ORAL | Status: DC
  Administered 2019-10-25 – 2019-10-28 (×6): 25 mg via ORAL
  Filled 2019-10-25 (×6): qty 1

## 2019-10-25 MED ORDER — POTASSIUM CHLORIDE CRYS ER 20 MEQ PO TBCR
40.0000 meq | EXTENDED_RELEASE_TABLET | Freq: Once | ORAL | Status: AC
  Administered 2019-10-25: 40 meq via ORAL
  Filled 2019-10-25: qty 2

## 2019-10-25 MED ORDER — MAGNESIUM SULFATE 4 GM/100ML IV SOLN
4.0000 g | Freq: Once | INTRAVENOUS | Status: AC
  Administered 2019-10-25: 4 g via INTRAVENOUS
  Filled 2019-10-25: qty 100

## 2019-10-25 MED ORDER — DILTIAZEM HCL 60 MG PO TABS
60.0000 mg | ORAL_TABLET | Freq: Once | ORAL | Status: AC
  Administered 2019-10-25: 60 mg via ORAL
  Filled 2019-10-25: qty 1

## 2019-10-25 NOTE — Progress Notes (Signed)
Patient Demographics:    Jeremy [name], is a 74 y.o. male, DOB - [birth year], WRU:[id number]  Admit date - 10/19/2019   Admitting Physician [name], MD  Outpatient Primary MD for the patient is [name], [name], MD  LOS - 6   Chief Complaint  Patient presents with   Code Sepsis        Subjective:    Jeremy [name] today has  no emesis,  No chest pain,    -Wife at bedside, had fevers yesterday but none today -Required transfusion of 2 units of PRBC overnight -Complains of lack of sleep due to transfusion overnight -Tachyarrhythmia more infrequent and less persistent  Assessment  & Plan :    Principal Problem:   Sepsis due to undetermined organism [facility]) Active Problems:   COPD (chronic obstructive pulmonary disease) ([place])   Type 2 diabetes mellitus (HCC)   HTN (hypertension)   Anemia in neoplastic disease   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[place])   Thrombocytopenia ([place])  Brief Summary:- 74 y.o. male with medical history significant of right lung cancer, COPD, type 2 diabetes, hypertension, psoriatic arthritis, with status post palliative chemo 10/14/2019 admitted on 10/19/2019 with pneumonia concerns about sepsis -Found to have tachyarrhythmia  A/p  1)Sepsis of urinary source---   -No further high fevers  -Consider switching to oral antibiotics on 10/26/2019 -Urine culture from 10/19/2019 with Enterococcus and Staphylococcus hemolyticus -Blood cultures 5/ 20/21 and repeat blood cultures from 10/23/2019 NGTD - stopped vancomycin as MRSA negative Antimicrobials this admission: 5/16 Cefepime >> 5/19 5/16 Vancomycin >> 5/19 5/16 Metronidazole x 1  5/19 Unasyn >> 5/19 TMP/SMX PO >>  Microbiology results: 5/16 BCx X 2: NGTD 5/16 UCx: >100,000 CFU/ml E. faecalis pan sensitive; >100,000 CFU/ml Staph haemolyticus susceptible to cipro, clinda, tetracycline, vancomycin, TMP/SMX;  resistant to oxacillin 5/16 COVID: negative 5/13 UCx: NG/final 5/18 MRSA PCR: negative  -Stopped IV cefepime, started Unasyn given fevers, as well as Enterococcus and Bactrim for staph in the urine -Watch renal function closely while on Bactrim  repeat blood cultures from 10/23/2019 NGTD   2)DM2-recent A1c 7.0 reflecting fair diabetic control PTA -Continue Metformin and Use Novolog/Humalog Sliding scale insulin with Accu-Cheks/Fingersticks as ordered   3)Stage IV squamous cell carcinoma of right lung - Necrotic right lung mass, last palliative chemo on 10/14/2019--- [name] who is patient's oncologist notified of admission to the hospital -Keytruda stopped b/c of rash  4)Chronic Neoplastic Anemia and Thrombocytopenia  in the patient with underlying malignancy currently undergoing chemotherapy--- monitor closely and transfuse as clinically indicated -Platelets up to 76 from 67, no bleeding noted, hemoglobin down to 6.5 from 7.4--baseline Hgb usually above 8 -Transfused 2 units of PRBC on 10/23/2019 -Repeat CBC pending -- 5)HTN=-continue Cardizem  6)COPD----antibiotics as above #1, continue bronchodilators and mucolytics  7)Acute on chronic  Hypoxic Respiratory Failure--- PTA patient was on 2 L of oxygen at home, -- suspect due to #1 and #6 above as well as #3 -Oxygen requirement appears to be back to baseline at this time  8)PAFib/PAT/PSVT-- CHA2DS2- VASc score   is = 3 (HTN, DM and age)   Which is  equal to = 3.2 % annual risk of stroke -Patient is not on anticoagulation due to  chronic anemia requiring transfusion from time to time -Discussed with Dr. [name] from cardiology service, -Patient's EF is 60 to 65%,  -Rate control remains very very challenging  PTA patient was on Cardizem 30 mg twice daily  -Currently titrated up to Cardizem 360 daily  -Tachycardia persist patient is symptomatic with dizziness dyspnea on exertion palpitations and shortness of breath  -Received  additional doses of short acting Cardizem rate control continues to remain difficult -Add metoprolol 25 mg twice daily for better rate control -He may need amiodarone  9)HFpEF--patient with history of chronic diastolic dysfunction CHF with preserved EF over 60 to 65%, appears euvolemic at this time --Rate control as above in #8  11) hypokalemia and hypomagnesemia----replaced and normalized =- 12) generalized weakness/deconditioning/ambulatory dysfunction--- Initial physical therapy eval appreciated -Patient's wife is concerned about increasing weakness and possible need for SNF rehab --We will get physical therapy to reevaluate him  Disposition/Need for in-Hospital Stay- patient unable to be discharged at this time due to ---sepsis secondary to suspected pulmonary etiology, requiring IV Unasyn--and atrial fibrillation with difficult rate control -Also needs further adjustments of rate control medications -Patient From: home D/C Place: home with [facility] Barriers: Not Clinically Stable-  --Tachycardia persist patient is symptomatic with dizziness dyspnea on exertion palpitations and shortness of breath  -Received additional doses of short acting Cardizem rate control continues to remain difficult -Add metoprolol 25 mg twice daily for better rate control -He may need amiodarone   Code Status : DNR  Family Communication:   (patient is alert, awake and coherent) -Discussed with patient's wife at bedside  Consults  :  na DVT Prophylaxis  :   - SCDs low-platelets  Lab Results  Component Value Date   PLT 84 (L) 10/25/2019    Inpatient Medications  Scheduled Meds:  vitamin C  1,000 mg Oral Daily   diltiazem  360 mg Oral Daily   ferrous sulfate  325 mg Oral Q supper   folic acid  1 mg Oral Daily   gabapentin  600 mg Oral QHS   insulin aspart  0-15 Units Subcutaneous TID WC   loratadine  10 mg Oral Daily   melatonin  4.5 mg Oral QHS   metFORMIN  1,000 mg Oral Q breakfast    metFORMIN  500 mg Oral Q supper   metoprolol tartrate  25 mg Oral BID   pantoprazole  40 mg Oral Daily   phosphorus  500 mg Oral BID   potassium chloride  40 mEq Oral Once   triamcinolone cream   Topical QID   Continuous Infusions:  ampicillin-sulbactam (UNASYN) IV 3 g (10/25/19 1433)   magnesium sulfate bolus IVPB     PRN Meds:.acetaminophen, levalbuterol, lidocaine-prilocaine, traMADol    Anti-infectives (From admission, onward)   Start     Dose/Rate Route Frequency Ordered Stop   10/22/19 2000  Ampicillin-Sulbactam (UNASYN) 3 g in sodium chloride 0.9 % 100 mL IVPB     3 g 200 mL/hr over 30 Minutes Intravenous Every 6 hours 10/22/19 1932     10/22/19 2000  sulfamethoxazole-trimethoprim (BACTRIM DS) 800-160 MG per tablet 1 tablet     1 tablet Oral Every 12 hours 10/22/19 1955 10/24/19 1021   10/20/19 0200  ceFEPIme (MAXIPIME) 2 g in sodium chloride 0.9 % 100 mL IVPB  Status:  Discontinued     2 g 200 mL/hr over 30 Minutes Intravenous Every 8 hours 10/19/19 1802 10/22/19 1857   10/20/19 0000  vancomycin (VANCOCIN) IVPB 1000 mg/200 mL  premix  Status:  Discontinued     1,000 mg 200 mL/hr over 60 Minutes Intravenous Every 8 hours 10/19/19 1802 10/22/19 1039   10/19/19 1745  ceFEPIme (MAXIPIME) 2 g in sodium chloride 0.9 % 100 mL IVPB     2 g 200 mL/hr over 30 Minutes Intravenous  Once 10/19/19 1741 10/19/19 1831   10/19/19 1745  metroNIDAZOLE (FLAGYL) IVPB 500 mg     500 mg 100 mL/hr over 60 Minutes Intravenous  Once 10/19/19 1741 10/19/19 1933   10/19/19 1745  vancomycin (VANCOCIN) IVPB 1000 mg/200 mL premix     1,000 mg 200 mL/hr over 60 Minutes Intravenous  Once 10/19/19 1741 10/19/19 2050        Objective:   Vitals:   10/24/19 2035 10/25/19 0443 10/25/19 0451 10/25/19 1346  BP: 131/80 140/75  124/80  Pulse: 95 (!) 112 (!) 103 (!) 104  Resp: 18 19    Temp: 98.5 F (36.9 C) 99 F (37.2 C)  98.7 F (37.1 C)  TempSrc: Oral Oral  Oral  SpO2: 94% 91%  98%    Weight:      Height:        Wt Readings from Last 3 Encounters:  10/19/19 81.6 kg  10/16/19 81.6 kg  10/14/19 82.7 kg     Intake/Output Summary (Last 24 hours) at 10/25/2019 1720 Last data filed at 10/25/2019 1300 Gross per 24 hour  Intake 480 ml  Output 800 ml  Net -320 ml   Physical Exam  Gen:- Awake Alert,  In no apparent distress  HEENT:- [place].AT, No sclera icterus Nose- [place] 2L/min Neck-Supple Neck,No JVD,.  Lungs-diminished breath sounds with scattered rhonchi,  CV- S1, S2 normal,irregular , tachycardia persist Port-A-Cath site is clean dry and intact Abd-  +ve B.Sounds, Abd Soft, No tenderness,    Extremity/Skin:- No  edema, pedal pulses present Psych-affect is appropriate, oriented x3 Neuro-generalized weakness and deconditioning, no new focal deficits, no tremors   Data Review:   Micro Results Recent Results (from the past 240 hour(s))  Urine culture     Status: None   Collection Time: 10/16/19  5:36 AM   Specimen: Urine, Clean Catch  Result Value Ref Range Status   Specimen Description   Final    URINE, CLEAN CATCH Performed at [facility], [place] [address], [place], [place] [address]    Special Requests   Final    NONE Performed at [facility], [place] [address] [place], [place] [address]    Culture   Final    NO GROWTH Performed at [facility], [place] [address], [place], [place] [address]    Report Status 10/17/2019 FINAL  Final  SARS Coronavirus 2 by RT PCR (hospital order, performed in [facility] hospital lab) Nasopharyngeal Nasopharyngeal Swab     Status: None   Collection Time: 10/16/19  6:12 AM   Specimen: Nasopharyngeal Swab  Result Value Ref Range Status   SARS Coronavirus 2 NEGATIVE NEGATIVE Final    Comment: (NOTE) SARS-CoV-2 target nucleic acids are NOT DETECTED. The SARS-CoV-2 RNA is generally detectable in upper and lower respiratory specimens during the acute phase of infection. The  lowest concentration of SARS-CoV-2 viral copies this assay can detect is 250 copies / mL. A negative result does not preclude SARS-CoV-2 infection and should not be used as the sole basis for treatment or other patient management decisions.  A negative result may occur with improper specimen collection / handling, submission of  specimen other than nasopharyngeal swab, presence of viral mutation(s) within the areas targeted by this assay, and inadequate number of viral copies (<250 copies / mL). A negative result must be combined with clinical observations, patient history, and epidemiological information. Fact Sheet for Patients:   StrictlyIdeas.no Fact Sheet for Healthcare Providers: BankingDealers.co.za This test is not yet approved or cleared  by the [place] FDA and has been authorized for detection and/or diagnosis of SARS-CoV-2 by FDA under an Emergency Use Authorization (EUA).  This EUA will remain in effect (meaning this test can be used) for the duration of the COVID-19 declaration under Section 564(b)(1) of the Act, 21 U.S.C. section 360bbb-3(b)(1), unless the authorization is terminated or revoked sooner. Performed at [facility], [place] [address] [place], [place] [address]   Culture, blood (Routine x 2)     Status: None   Collection Time: 10/19/19  5:25 PM   Specimen: BLOOD LEFT ARM  Result Value Ref Range Status   Specimen Description BLOOD LEFT ARM  Final   Special Requests   Final    BOTTLES DRAWN AEROBIC AND ANAEROBIC Blood Culture adequate volume   Culture   Final    NO GROWTH 5 DAYS Performed at [facility], [address], [place], [place] [address]    Report Status 10/24/2019 FINAL  Final  Urine culture     Status: Abnormal   Collection Time: 10/19/19  5:41 PM   Specimen: In/Out Cath Urine  Result Value Ref Range Status   Specimen Description   Final    IN/OUT CATH URINE Performed at  [facility], [address], [place], [place] [address]    Special Requests   Final    NONE Performed at [facility], [address] [place], [place] [address]    Culture (A)  Final    >=100,000 COLONIES/mL ENTEROCOCCUS FAECALIS >=100,000 COLONIES/mL STAPHYLOCOCCUS HAEMOLYTICUS    Report Status 10/22/2019 FINAL  Final   Organism ID, Bacteria ENTEROCOCCUS FAECALIS (A)  Final   Organism ID, Bacteria STAPHYLOCOCCUS HAEMOLYTICUS (A)  Final      Susceptibility   Enterococcus faecalis - MIC*    AMPICILLIN <=2 SENSITIVE Sensitive     NITROFURANTOIN <=16 SENSITIVE Sensitive     VANCOMYCIN 2 SENSITIVE Sensitive     * >=100,000 COLONIES/mL ENTEROCOCCUS FAECALIS   Staphylococcus haemolyticus - MIC*    CIPROFLOXACIN <=0.5 SENSITIVE Sensitive     GENTAMICIN <=0.5 SENSITIVE Sensitive     NITROFURANTOIN <=16 SENSITIVE Sensitive     OXACILLIN >=4 RESISTANT Resistant     TETRACYCLINE <=1 SENSITIVE Sensitive     VANCOMYCIN 1 SENSITIVE Sensitive     TRIMETH/SULFA <=10 SENSITIVE Sensitive     CLINDAMYCIN <=0.25 SENSITIVE Sensitive     RIFAMPIN <=0.5 SENSITIVE Sensitive     Inducible Clindamycin NEGATIVE Sensitive     * >=100,000 COLONIES/mL STAPHYLOCOCCUS HAEMOLYTICUS  Culture, blood (Routine x 2)     Status: None   Collection Time: 10/19/19  5:50 PM   Specimen: BLOOD LEFT WRIST  Result Value Ref Range Status   Specimen Description BLOOD LEFT WRIST  Final   Special Requests   Final    BOTTLES DRAWN AEROBIC AND ANAEROBIC Blood Culture adequate volume   Culture   Final    NO GROWTH 5 DAYS Performed at [facility], [address], [place], [place] [address]    Report Status 10/24/2019 FINAL  Final  SARS Coronavirus 2 by RT PCR (hospital order, performed in [facility] hospital lab)  Nasopharyngeal Nasopharyngeal Swab     Status: None   Collection Time: 10/19/19  8:17 PM   Specimen: Nasopharyngeal Swab  Result Value Ref Range Status   SARS Coronavirus 2 NEGATIVE NEGATIVE Final    Comment:  (NOTE) SARS-CoV-2 target nucleic acids are NOT DETECTED. The SARS-CoV-2 RNA is generally detectable in upper and lower respiratory specimens during the acute phase of infection. The lowest concentration of SARS-CoV-2 viral copies this assay can detect is 250 copies / mL. A negative result does not preclude SARS-CoV-2 infection and should not be used as the sole basis for treatment or other patient management decisions.  A negative result may occur with improper specimen collection / handling, submission of specimen other than nasopharyngeal swab, presence of viral mutation(s) within the areas targeted by this assay, and inadequate number of viral copies (<250 copies / mL). A negative result must be combined with clinical observations, patient history, and epidemiological information. Fact Sheet for Patients:   StrictlyIdeas.no Fact Sheet for Healthcare Providers: BankingDealers.co.za This test is not yet approved or cleared  by the [place] FDA and has been authorized for detection and/or diagnosis of SARS-CoV-2 by FDA under an Emergency Use Authorization (EUA).  This EUA will remain in effect (meaning this test can be used) for the duration of the COVID-19 declaration under Section 564(b)(1) of the Act, 21 U.S.C. section 360bbb-3(b)(1), unless the authorization is terminated or revoked sooner. Performed at [facility], [address], [place], [place] [address]   MRSA PCR Screening     Status: None   Collection Time: 10/21/19  3:16 PM   Specimen: Nasal Mucosa; Nasopharyngeal  Result Value Ref Range Status   MRSA by PCR NEGATIVE NEGATIVE Final    Comment:        The GeneXpert MRSA Assay (FDA approved for NASAL specimens only), is one component of a comprehensive MRSA colonization surveillance program. It is not intended to diagnose MRSA infection nor to guide or monitor treatment for MRSA infections. Performed at [facility], [address] [place], [place] [address]   Culture, blood (Routine X 2) w Reflex to ID Panel     Status: None (Preliminary result)   Collection Time: 10/23/19  8:16 PM   Specimen: BLOOD LEFT HAND  Result Value Ref Range Status   Specimen Description BLOOD LEFT HAND  Final   Special Requests   Final    Blood Culture adequate volume BOTTLES DRAWN AEROBIC ONLY   Culture   Final    NO GROWTH 2 DAYS Performed at [facility], [address] [place], [place] [address]    Report Status PENDING  Incomplete  Culture, blood (Routine X 2) w Reflex to ID Panel     Status: None (Preliminary result)   Collection Time: 10/23/19  8:17 PM   Specimen: BLOOD LEFT WRIST  Result Value Ref Range Status   Specimen Description BLOOD LEFT WRIST  Final   Special Requests   Final    BOTTLES DRAWN AEROBIC AND ANAEROBIC Blood Culture adequate volume   Culture   Final    NO GROWTH 2 DAYS Performed at [facility], [address] [place], [place] [address]    Report Status PENDING  Incomplete    Radiology Reports DG Chest 2 View  Result Date: 10/16/2019 CLINICAL DATA:  Lung cancer and weakness EXAM: CHEST - 2 VIEW COMPARISON:  08/06/2019 CT.  PET-CT 09/08/2019 FINDINGS: Right lower chest opacification with volume loss from necrotic mass by comparison CT. Small to  moderate right lateral pleural effusion has increased from prior. Left chest is clear. Stable heart size. Right port with tip at the SVC. IMPRESSION: Necrotic right lung mass as seen on recent staging scan. An overlying right pleural effusion has increased from April. Electronically Signed   By: Monte Fantasia M.D.   On: 10/16/2019 04:43   CT HEAD WO CONTRAST  Result Date: 10/16/2019 CLINICAL DATA:  Mental status change, unknown cause. Additional provided: Weakness, history of lung cancer EXAM: CT HEAD WITHOUT CONTRAST TECHNIQUE: Contiguous axial images were obtained from the base of the skull through the vertex without intravenous contrast.  COMPARISON:  Brain MRI 08/10/2019 FINDINGS: Brain: Please note there is limited assessment for intracranial metastatic disease on this noncontrast CT study. Mild ill-defined hypoattenuation within the cerebral white matter is nonspecific, but consistent with chronic small vessel ischemic disease. Stable, mild generalized parenchymal atrophy. There is no acute intracranial hemorrhage. No demarcated cortical infarct. No extra-axial fluid collection. No evidence of intracranial mass. No midline shift. Vascular: No hyperdense vessel.  Atherosclerotic calcifications Skull: Normal. Negative for fracture or focal lesion. Sinuses/Orbits: Visualized orbits show no acute finding. Paranasal sinus mucosal thickening, most notable within bilateral ethmoid air cells. No significant mastoid effusion. IMPRESSION: 1. Please note there is limited assessment for intracranial metastatic disease on this non-contrast CT study. 2. No evidence of acute intracranial abnormality. 3. Mild generalized parenchymal atrophy and chronic small vessel ischemic disease. 4. Paranasal sinus mucosal thickening, most notably ethmoid. Electronically Signed   By: Kellie Simmering DO   On: 10/16/2019 09:52   DG Chest Port 1 View  Result Date: 10/19/2019 CLINICAL DATA:  Fever and urinary complaints EXAM: PORTABLE CHEST 1 VIEW COMPARISON:  Radiograph 10/16/2019 FINDINGS: Likely increasing size of the complex right pleural effusion with associated passive atelectasis though some underlying consolidation is not excluded. A necrotic lung mass is again seen in the right middle lobe, better visualized on cross-sectional imaging. Left lung is predominantly clear aside from basilar atelectasis. Left apical scarring as well. Cardiomediastinal contours are unchanged from prior, partially obscured in the right lung base by overlying opacity. Right IJ Port-A-Cath tip terminates at the superior cavoatrial junction. Telemetry leads overlie the chest. Degenerative changes  are present in the imaged spine and shoulders. IMPRESSION: 1. Likely increasing size of the complex right pleural effusion with associated passive atelectasis though some underlying consolidation is not excluded in the setting of fever. 2. Necrotic right lung mass, better visualized on cross-sectional imaging. Electronically Signed   By: Lovena Le M.D.   On: 10/19/2019 17:39   ECHOCARDIOGRAM COMPLETE  Result Date: 10/09/2019    ECHOCARDIOGRAM REPORT   Patient Name:   JAYVAN MCSHAN Deboy Date of Exam: 10/09/2019 Medical Rec #:  [id number]     Height:       74.0 in Accession #:    [id number]    Weight:       179.6 lb Date of Birth:  22-Nov-1945     BSA:          2.076 m Patient Age:    1 years      BP:           117/71 mmHg Patient Gender: M             HR:           94 bpm. Exam Location:  [place] Procedure: 2D Echo, Cardiac Doppler and Color Doppler Indications:     I48.0 PAF  History:  Patient has no prior history of Echocardiogram examinations.                  COPD and Stage IV lung cancer. Anemia, Arrythmias:Atrial                  Fibrillation; Risk Factors:Hypertension, Diabetes and Former                  Smoker.  Sonographer:     Jeneen Montgomery RDMS, RVT, RDCS Referring Phys:  [id number] Alphonse Guild BRANCH Diagnosing Phys: Kate Sable MD  Sonographer Comments: Technically difficult study due to poor echo windows. Image acquisition challenging due to COPD and Image acquisition challenging due to patient body habitus. IMPRESSIONS  1. Left ventricular ejection fraction, by estimation, is 60 to 65%. The left ventricle has normal function. Left ventricular endocardial border not optimally defined to evaluate regional wall motion. Left ventricular diastolic parameters are consistent with Grade I diastolic dysfunction (impaired relaxation).  2. Right ventricular systolic function is normal. The right ventricular size is moderately enlarged.  3. Right atrial size was mild to moderately dilated.  4. The mitral  valve was not well visualized. Trivial mitral valve regurgitation.  5. Tricuspid valve regurgitation unable to assess.  6. The aortic valve was not well visualized. Aortic valve regurgitation is mild.  7. Pulmonic valve regurgitation unable to assess.  8. Aortic dilatation noted. There is mild dilatation of the aortic root. Conclusion(s)/Recommendation(s): Suboptimal study. Consider contrast enhancement for future studies. FINDINGS  Left Ventricle: Left ventricular ejection fraction, by estimation, is 60 to 65%. The left ventricle has normal function. Left ventricular endocardial border not optimally defined to evaluate regional wall motion. The left ventricular internal cavity size was normal in size. There is no left ventricular hypertrophy. Left ventricular diastolic parameters are consistent with Grade I diastolic dysfunction (impaired relaxation). Normal left ventricular filling pressure. Right Ventricle: The right ventricular size is moderately enlarged. Right vetricular wall thickness was not assessed. Right ventricular systolic function is normal. Left Atrium: Left atrial size was normal in size. Right Atrium: Right atrial size was mild to moderately dilated. Pericardium: There is no evidence of pericardial effusion. Mitral Valve: The mitral valve was not well visualized. Trivial mitral valve regurgitation. Tricuspid Valve: The tricuspid valve is not well visualized. Tricuspid valve regurgitation unable to assess. Aortic Valve: The aortic valve was not well visualized. Aortic valve regurgitation is mild. Pulmonic Valve: The pulmonic valve was not well visualized. Pulmonic valve regurgitation unable to assess. Aorta: Aortic dilatation noted. There is mild dilatation of the aortic root. Venous: The inferior vena cava was not well visualized. IAS/Shunts: The interatrial septum was not well visualized.  LEFT VENTRICLE PLAX 2D LVIDd:         3.88 cm      Diastology LVIDs:         2.68 cm      LV e' lateral:    5.62 cm/s LV PW:         0.96 cm      LV E/e' lateral: 11.1 LV IVS:        1.21 cm      LV e' medial:    7.86 cm/s LVOT diam:     2.00 cm      LV E/e' medial:  8.0 LV SV:         57 LV SV Index:   27 LVOT Area:     3.14 cm  LV Volumes (MOD) LV vol d,  MOD A2C: 68.9 ml LV vol d, MOD A4C: 118.0 ml LV vol s, MOD A2C: 37.0 ml LV vol s, MOD A4C: 47.6 ml LV SV MOD A2C:     31.9 ml LV SV MOD A4C:     118.0 ml LV SV MOD BP:      54.6 ml RIGHT VENTRICLE RV S prime:     14.50 cm/s TAPSE (M-mode): 2.8 cm LEFT ATRIUM             Index LA diam:        2.70 cm 1.30 cm/m LA Vol (A2C):   56.7 ml 27.31 ml/m LA Vol (A4C):   40.7 ml 19.60 ml/m LA Biplane Vol:         23.90 ml/m  AORTIC VALVE LVOT Vmax:   103.00 cm/s LVOT Vmean:  64.000 cm/s LVOT VTI:    0.181 m  AORTA Ao Root diam: 3.80 cm MITRAL VALVE               TRICUSPID VALVE MV Area (PHT):             TR Peak grad:   3.0 mmHg MV Decel Time: 143 msec    TR Vmax:        86.30 cm/s MR Peak grad: 21.8 mmHg MR Vmax:      233.50 cm/s  SHUNTS MV E velocity: 62.60 cm/s  Systemic VTI:  0.18 m MV A velocity: 70.80 cm/s  Systemic Diam: 2.00 cm MV E/A ratio:  0.88 Kate Sable MD Electronically signed by Kate Sable MD Signature Date/Time: 10/09/2019/5:58:14 PM    Final    DG HIP UNILAT WITH PELVIS 2-3 VIEWS RIGHT  Result Date: 10/18/2019 CLINICAL DATA:  Right hip pain for 5 days following fall, initial encounter EXAM: DG HIP (WITH OR WITHOUT PELVIS) 2-3V RIGHT COMPARISON:  None. 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 No acute fracture or dislocation is seen. No soft tissue abnormality is noted. IMPRESSION: Degenerative change without acute abnormality. Electronically Signed   By: Inez Catalina M.D.   On: 10/18/2019 12:21     CBC Recent Labs  Lab 10/19/19 1720 10/19/19 1720 10/20/19 0605 10/20/19 2327 10/23/19 1924 10/24/19 1206 10/25/19 1159  WBC 4.7   < > 4.6 5.7 3.4* 3.7* 2.9*  HGB 8.4*   < > 7.2* 7.4* 6.5* 9.3* 8.8*  HCT  27.4*   < > 23.3* 23.0* 21.1* 29.1* 28.7*  PLT 99*   < > 74* 67* 76* 87* 84*  MCV 97.2   < > 95.5 93.9 95.9 90.9 92.3  MCH 29.8   < > 29.5 30.2 29.5 29.1 28.3  MCHC 30.7   < > 30.9 32.2 30.8 32.0 30.7  RDW 17.8*   < > 17.7* 17.3* 17.3* 19.0* 18.6*  LYMPHSABS 0.2*  --  0.3* 0.4* 0.3*  --   --   MONOABS 0.1  --  0.2 0.2 0.2  --   --   EOSABS 0.0  --  0.0 0.0 0.0  --   --   BASOSABS 0.0  --  0.0 0.0 0.0  --   --    < > = values in this interval not displayed.    Chemistries  Recent Labs  Lab 10/19/19 1720 10/19/19 1720 10/20/19 0605 10/21/19 0541 10/22/19 0434 10/24/19 1206 10/25/19 1159  NA 133*   < > 132* 130* 135   134* 133* 133*  K 4.4   < > 3.3* 3.2* 4.1  4.1 3.7 3.8  CL 96*   < > 95* 91* 99   99 94* 94*  CO2 28   < > 27 29 27   27 28 26   GLUCOSE 161*   < > 118* 141* 124*   123* 162* 197*  BUN 16   < > 12 12 12   12 13 13   CREATININE 0.75   < > 0.55* 0.69 0.64   0.59* 0.85 0.75  CALCIUM 7.7*   < > 7.8* 7.9* 8.1*   8.0* 7.7* 7.6*  MG  --   --   --  1.5* 2.0 1.6* 1.6*  AST 16  --   --  19  --   --   --   ALT 23  --   --  21  --   --   --   ALKPHOS 57  --   --  51  --   --   --   BILITOT 0.5  --   --  0.8  --   --   --    < > = values in this interval not displayed.   ------------------------------------------------------------------------------------------------------------------ No results for input(s): CHOL, HDL, LDLCALC, TRIG, CHOLHDL, LDLDIRECT in the last 72 hours.  Lab Results  Component Value Date   HGBA1C 7.0 (H) 08/07/2019   ------------------------------------------------------------------------------------------------------------------ No results for input(s): TSH, T4TOTAL, T3FREE, THYROIDAB in the last 72 hours.  Invalid input(s): FREET3 ------------------------------------------------------------------------------------------------------------------ No results for input(s): VITAMINB12, FOLATE, FERRITIN, TIBC, IRON, RETICCTPCT in the last 72  hours.  Coagulation profile Recent Labs  Lab 10/19/19 1720  INR 1.0   No results for input(s): DDIMER in the last 72 hours.  Cardiac Enzymes No results for input(s): CKMB, TROPONINI, MYOGLOBIN in the last 168 hours.  Invalid input(s): CK ------------------------------------------------------------------------------------------------------------------    Component Value Date/Time   BNP 104.0 (H) 08/06/2019 1211   Roxan Hockey M.D on 10/25/2019 at 5:20 PM  Go to [email or web address] - for contact info  [facility] - Office  [phone]

## 2019-10-26 LAB — GLUCOSE, CAPILLARY
Glucose-Capillary: 106 mg/dL — ABNORMAL HIGH (ref 70–99)
Glucose-Capillary: 206 mg/dL — ABNORMAL HIGH (ref 70–99)
Glucose-Capillary: 144 mg/dL — ABNORMAL HIGH (ref 70–99)
Glucose-Capillary: 111 mg/dL — ABNORMAL HIGH (ref 70–99)

## 2019-10-26 LAB — BPAM RBC
Blood Product Expiration Date: 202106062359
Blood Product Expiration Date: 202106062359
ISSUE DATE / TIME: 202105210041
ISSUE DATE / TIME: 202105210313
Unit Type and Rh: 6200
Unit Type and Rh: 6200

## 2019-10-26 LAB — RENAL FUNCTION PANEL
Albumin: 2.3 g/dL — ABNORMAL LOW (ref 3.5–5.0)
Anion gap: 10 (ref 5–15)
BUN: 11 mg/dL (ref 8–23)
CO2: 28 mmol/L (ref 22–32)
Calcium: 7.7 mg/dL — ABNORMAL LOW (ref 8.9–10.3)
Chloride: 96 mmol/L — ABNORMAL LOW (ref 98–111)
Creatinine, Ser: 0.66 mg/dL (ref 0.61–1.24)
GFR calc Af Amer: >60 mL/min (ref >60)
GFR calc non Af Amer: >60 mL/min (ref >60)
Glucose, Bld: 124 mg/dL — ABNORMAL HIGH (ref 70–99)
Phosphorus: 2.5 mg/dL (ref 2.5–4.6)
Potassium: 3.8 mmol/L (ref 3.5–5.1)
Sodium: 134 mmol/L — ABNORMAL LOW (ref 135–145)

## 2019-10-26 LAB — TYPE AND SCREEN
ABO/RH(D): A POS
Antibody Screen: NEGATIVE
Unit division: 0
Unit division: 0

## 2019-10-26 LAB — CBC
HCT: 28.4 % — ABNORMAL LOW (ref 39.0–52.0)
Hemoglobin: 8.5 g/dL — ABNORMAL LOW (ref 13.0–17.0)
MCH: 28 pg (ref 26.0–34.0)
MCHC: 29.9 g/dL — ABNORMAL LOW (ref 30.0–36.0)
MCV: 93.4 fL (ref 80.0–100.0)
Platelets: 96 10*3/uL — ABNORMAL LOW (ref 150–400)
RBC: 3.04 MIL/uL — ABNORMAL LOW (ref 4.22–5.81)
RDW: 17.9 % — ABNORMAL HIGH (ref 11.5–15.5)
WBC: 3 10*3/uL — ABNORMAL LOW (ref 4.0–10.5)
nRBC: 0 % (ref 0.0–0.2)

## 2019-10-26 LAB — MAGNESIUM: Magnesium: 2.1 mg/dL (ref 1.7–2.4)

## 2019-10-26 NOTE — Progress Notes (Signed)
Patient Demographics:    Jeremy [name], is a 74 y.o. male, DOB - 1946-03-15, ZOX:[id number]  Admit date - 10/19/2019   Admitting Physician [name], MD  Outpatient Primary MD for the patient is [name], [name], MD  LOS - 7   Chief Complaint  Patient presents with  . Code Sepsis        Subjective:    [name] today has  no emesis,  No chest pain,    -Wife at bedside,   Patient is very weak and deconditioned--unable to get up even with a walker -Wife is very concerned about being high fall risk if discharged home requesting SNF placement  Assessment  & Plan :    Principal Problem:   Sepsis due to undetermined organism [facility]) Active Problems:   COPD (chronic obstructive pulmonary disease) ([place])   Type 2 diabetes mellitus ([place])   HTN (hypertension)   Anemia in neoplastic disease   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[place])   Thrombocytopenia ([place])  Brief Summary:- 74 y.o. male with medical history significant of right lung cancer, COPD, type 2 diabetes, hypertension, psoriatic arthritis, with status post palliative chemo 10/14/2019 admitted on 10/19/2019 with pneumonia concerns about sepsis -Found to have tachyarrhythmia -Patient is very weak and deconditioned--unable to get up even with a walker -Wife is very concerned about being high fall risk if discharged home requesting SNF placement  A/p  1)Sepsis of urinary source---   -No further high fevers  -Stop Unasyn and start Augmentin for Enterococcus and concerns about pneumonia -Treated with Bactrim for staph in urine -Urine culture from 10/19/2019 with Enterococcus and Staphylococcus hemolyticus -Blood cultures 5/ 20/21 and repeat blood cultures from 10/23/2019 NGTD - stopped vancomycin as MRSA negative Antimicrobials this admission: 5/16 Cefepime >> 5/19 5/16 Vancomycin >> 5/19 5/16 Metronidazole x 1  5/19 Unasyn  >>10/26/19 5/19 TMP/SMX PO >> 10/26/19-augmentin  Microbiology results: 5/16 BCx X 2: NGTD 5/16 UCx: >100,000 CFU/ml E. faecalis pan sensitive; >100,000 CFU/ml Staph haemolyticus susceptible to cipro, clinda, tetracycline, vancomycin, TMP/SMX; resistant to oxacillin 5/16 COVID: negative 5/13 UCx: NG/final 5/18 MRSA PCR: negative  -Watch renal function closely while on Bactrim  repeat blood cultures from 10/23/2019 NGTD   2)DM2-recent A1c 7.0 reflecting fair diabetic control PTA -Continue Metformin and Use Novolog/Humalog Sliding scale insulin with Accu-Cheks/Fingersticks as ordered   3)Stage IV squamous cell carcinoma of right lung - Necrotic right lung mass, last palliative chemo on 10/14/2019--- [name] who is patient's oncologist notified of admission to the hospital -Keytruda stopped b/c of rash  4)Chronic Neoplastic Anemia and Thrombocytopenia  in the patient with underlying malignancy currently undergoing chemotherapy--- monitor closely and transfuse as clinically indicated -Platelets up to 76 from 67, no bleeding noted, hemoglobin down to 6.5 from 7.4--baseline Hgb usually above 8 -Transfused 2 units of PRBC on 10/23/2019 -Repeat CBC pending -- 5)HTN=-continue Cardizem  6)COPD----antibiotics as above #1, continue bronchodilators and mucolytics  7)Acute on chronic  Hypoxic Respiratory Failure--- PTA patient was on 2 L of oxygen at home, -- suspect due to #1 and #6 above as well as #3 -Oxygen requirement appears to be back to baseline at this time  8)PAFib/PAT/PSVT-- CHA2DS2- VASc score   is = 3 (HTN, DM  and age)   Which is  equal to = 3.2 % annual risk of stroke -Patient is not on anticoagulation due to chronic anemia requiring transfusion from time to time -Discussed with Dr. [name] from cardiology service, -Patient's EF is 60 to 65%,  -Rate control remains very very challenging  PTA patient was on Cardizem 30 mg twice daily  -Currently titrated up to Cardizem 360  daily  -Tachycardia persist patient is symptomatic with dizziness dyspnea on exertion palpitations and shortness of breath  -Received additional doses of short acting Cardizem rate control continues to remain difficult -Continue metoprolol 25 mg twice daily for better rate control -He may need amiodarone  9)HFpEF--patient with history of chronic diastolic dysfunction CHF with preserved EF over 60 to 65%, appears euvolemic at this time --Rate control as above in #8  11) hypokalemia and hypomagnesemia----replaced and normalized =- 12) generalized weakness/deconditioning/ambulatory dysfunction--- Initial physical therapy eval appreciated -Patient is very weak and deconditioned--unable to get up even with a walker -Wife is very concerned about being high fall risk if discharged home requesting SNF placement -PT eval requested  Disposition/Need for in-Hospital Stay- patient unable to be discharged at this time due to ----awaiting SNF placement -Patient From: home D/C Place: awaiting SNF placement Barriers: awaiting SNF placement  Code Status : DNR  Family Communication:   (patient is alert, awake and coherent) -Discussed with patient's wife at bedside  Consults  :  na DVT Prophylaxis  :   - SCDs low-platelets  Lab Results  Component Value Date   PLT 96 (L) 10/26/2019    Inpatient Medications  Scheduled Meds: . vitamin C  1,000 mg Oral Daily  . diltiazem  360 mg Oral Daily  . ferrous sulfate  325 mg Oral Q supper  . folic acid  1 mg Oral Daily  . gabapentin  600 mg Oral QHS  . insulin aspart  0-15 Units Subcutaneous TID WC  . loratadine  10 mg Oral Daily  . melatonin  4.5 mg Oral QHS  . metFORMIN  1,000 mg Oral Q breakfast  . metFORMIN  500 mg Oral Q supper  . metoprolol tartrate  25 mg Oral BID  . pantoprazole  40 mg Oral Daily  . phosphorus  500 mg Oral BID  . sodium chloride flush  3 mL Intravenous Q12H  . triamcinolone cream   Topical QID   Continuous Infusions: .  ampicillin-sulbactam (UNASYN) IV 3 g (10/26/19 1426)   PRN Meds:.acetaminophen, levalbuterol, lidocaine-prilocaine, traMADol    Anti-infectives (From admission, onward)   Start     Dose/Rate Route Frequency Ordered Stop   10/22/19 2000  Ampicillin-Sulbactam (UNASYN) 3 g in sodium chloride 0.9 % 100 mL IVPB     3 g 200 mL/hr over 30 Minutes Intravenous Every 6 hours 10/22/19 1932     10/22/19 2000  sulfamethoxazole-trimethoprim (BACTRIM DS) 800-160 MG per tablet 1 tablet     1 tablet Oral Every 12 hours 10/22/19 1955 10/24/19 1021   10/20/19 0200  ceFEPIme (MAXIPIME) 2 g in sodium chloride 0.9 % 100 mL IVPB  Status:  Discontinued     2 g 200 mL/hr over 30 Minutes Intravenous Every 8 hours 10/19/19 1802 10/22/19 1857   10/20/19 0000  vancomycin (VANCOCIN) IVPB 1000 mg/200 mL premix  Status:  Discontinued     1,000 mg 200 mL/hr over 60 Minutes Intravenous Every 8 hours 10/19/19 1802 10/22/19 1039   10/19/19 1745  ceFEPIme (MAXIPIME) 2 g in sodium chloride 0.9 % 100  mL IVPB     2 g 200 mL/hr over 30 Minutes Intravenous  Once 10/19/19 1741 10/19/19 1831   10/19/19 1745  metroNIDAZOLE (FLAGYL) IVPB 500 mg     500 mg 100 mL/hr over 60 Minutes Intravenous  Once 10/19/19 1741 10/19/19 1933   10/19/19 1745  vancomycin (VANCOCIN) IVPB 1000 mg/200 mL premix     1,000 mg 200 mL/hr over 60 Minutes Intravenous  Once 10/19/19 1741 10/19/19 2050        Objective:   Vitals:   10/25/19 2048 10/25/19 2130 10/26/19 0521 10/26/19 1500  BP: 133/79 127/65 131/67 130/77  Pulse: 99 97 99 100  Resp: 16  17   Temp: 98.7 F (37.1 C)  97.6 F (36.4 C) 98.7 F (37.1 C)  TempSrc: Oral  Axillary   SpO2: 92%  95% 96%  Weight:      Height:        Wt Readings from Last 3 Encounters:  10/19/19 81.6 kg  10/16/19 81.6 kg  10/14/19 82.7 kg     Intake/Output Summary (Last 24 hours) at 10/26/2019 1908 Last data filed at 10/26/2019 1700 Gross per 24 hour  Intake 1182.92 ml  Output 375.38 ml  Net  807.54 ml   Physical Exam  Gen:- Awake Alert,  In no apparent distress  HEENT:- [place].AT, No sclera icterus Nose- [place] 2L/min Neck-Supple Neck,No JVD,.  Lungs-diminished breath sounds with scattered rhonchi,  CV- S1, S2 normal,irregular , tachycardia persist Port-A-Cath site is clean dry and intact Abd-  +ve B.Sounds, Abd Soft, No tenderness,    Extremity/Skin:- No  edema, pedal pulses present Psych-affect is appropriate, oriented x3 Neuro-generalized weakness and deconditioning, no new focal deficits, no tremors   Data Review:   Micro Results Recent Results (from the past 240 hour(s))  Culture, blood (Routine x 2)     Status: None   Collection Time: 10/19/19  5:25 PM   Specimen: BLOOD LEFT ARM  Result Value Ref Range Status   Specimen Description BLOOD LEFT ARM  Final   Special Requests   Final    BOTTLES DRAWN AEROBIC AND ANAEROBIC Blood Culture adequate volume   Culture   Final    NO GROWTH 5 DAYS Performed at [facility], [address] [place], [place] [address]    Report Status 10/24/2019 FINAL  Final  Urine culture     Status: Abnormal   Collection Time: 10/19/19  5:41 PM   Specimen: In/Out Cath Urine  Result Value Ref Range Status   Specimen Description   Final    IN/OUT CATH URINE Performed at [facility], [address], [place], [place] [address]    Special Requests   Final    NONE Performed at [facility], [address] [place], [place] [address]    Culture (A)  Final    >=100,000 COLONIES/mL ENTEROCOCCUS FAECALIS >=100,000 COLONIES/mL STAPHYLOCOCCUS HAEMOLYTICUS    Report Status 10/22/2019 FINAL  Final   Organism ID, Bacteria ENTEROCOCCUS FAECALIS (A)  Final   Organism ID, Bacteria STAPHYLOCOCCUS HAEMOLYTICUS (A)  Final      Susceptibility   Enterococcus faecalis - MIC*    AMPICILLIN <=2 SENSITIVE Sensitive     NITROFURANTOIN <=16 SENSITIVE Sensitive     VANCOMYCIN 2 SENSITIVE Sensitive     * >=100,000 COLONIES/mL ENTEROCOCCUS FAECALIS    Staphylococcus haemolyticus - MIC*    CIPROFLOXACIN <=0.5 SENSITIVE Sensitive     GENTAMICIN <=0.5 SENSITIVE Sensitive     NITROFURANTOIN <=16 SENSITIVE Sensitive  OXACILLIN >=4 RESISTANT Resistant     TETRACYCLINE <=1 SENSITIVE Sensitive     VANCOMYCIN 1 SENSITIVE Sensitive     TRIMETH/SULFA <=10 SENSITIVE Sensitive     CLINDAMYCIN <=0.25 SENSITIVE Sensitive     RIFAMPIN <=0.5 SENSITIVE Sensitive     Inducible Clindamycin NEGATIVE Sensitive     * >=100,000 COLONIES/mL STAPHYLOCOCCUS HAEMOLYTICUS  Culture, blood (Routine x 2)     Status: None   Collection Time: 10/19/19  5:50 PM   Specimen: BLOOD LEFT WRIST  Result Value Ref Range Status   Specimen Description BLOOD LEFT WRIST  Final   Special Requests   Final    BOTTLES DRAWN AEROBIC AND ANAEROBIC Blood Culture adequate volume   Culture   Final    NO GROWTH 5 DAYS Performed at [facility], [address] [place], [place] [address]    Report Status 10/24/2019 FINAL  Final  SARS Coronavirus 2 by RT PCR (hospital order, performed in [place] hospital lab) Nasopharyngeal Nasopharyngeal Swab     Status: None   Collection Time: 10/19/19  8:17 PM   Specimen: Nasopharyngeal Swab  Result Value Ref Range Status   SARS Coronavirus 2 NEGATIVE NEGATIVE Final    Comment: (NOTE) SARS-CoV-2 target nucleic acids are NOT DETECTED. The SARS-CoV-2 RNA is generally detectable in upper and lower respiratory specimens during the acute phase of infection. The lowest concentration of SARS-CoV-2 viral copies this assay can detect is 250 copies / mL. A negative result does not preclude SARS-CoV-2 infection and should not be used as the sole basis for treatment or other patient management decisions.  A negative result may occur with improper specimen collection / handling, submission of specimen other than nasopharyngeal swab, presence of viral mutation(s) within the areas targeted by this assay, and inadequate number of viral copies (<250 copies  / mL). A negative result must be combined with clinical observations, patient history, and epidemiological information. Fact Sheet for Patients:   StrictlyIdeas.no Fact Sheet for Healthcare Providers: BankingDealers.co.za This test is not yet approved or cleared  by the [place] FDA and has been authorized for detection and/or diagnosis of SARS-CoV-2 by FDA under an Emergency Use Authorization (EUA).  This EUA will remain in effect (meaning this test can be used) for the duration of the COVID-19 declaration under Section 564(b)(1) of the Act, 21 U.S.C. section 360bbb-3(b)(1), unless the authorization is terminated or revoked sooner. Performed at [facility], [address] [place], [place] [address]   MRSA PCR Screening     Status: None   Collection Time: 10/21/19  3:16 PM   Specimen: Nasal Mucosa; Nasopharyngeal  Result Value Ref Range Status   MRSA by PCR NEGATIVE NEGATIVE Final    Comment:        The GeneXpert MRSA Assay (FDA approved for NASAL specimens only), is one component of a comprehensive MRSA colonization surveillance program. It is not intended to diagnose MRSA infection nor to guide or monitor treatment for MRSA infections. Performed at [facility], [address], [place], [place] [address]   Culture, blood (Routine X 2) w Reflex to ID Panel     Status: None (Preliminary result)   Collection Time: 10/23/19  8:16 PM   Specimen: BLOOD LEFT HAND  Result Value Ref Range Status   Specimen Description BLOOD LEFT HAND  Final   Special Requests   Final    Blood Culture adequate volume BOTTLES DRAWN AEROBIC ONLY   Culture   Final    NO GROWTH  3 DAYS Performed at [facility], [address], [place], [place] [address]    Report Status PENDING  Incomplete  Culture, blood (Routine X 2) w Reflex to ID Panel     Status: None (Preliminary result)   Collection Time: 10/23/19  8:17 PM   Specimen: BLOOD LEFT WRIST  Result  Value Ref Range Status   Specimen Description BLOOD LEFT WRIST  Final   Special Requests   Final    BOTTLES DRAWN AEROBIC AND ANAEROBIC Blood Culture adequate volume   Culture   Final    NO GROWTH 3 DAYS Performed at [facility], [address], [place], [place] [address]    Report Status PENDING  Incomplete    Radiology Reports DG Chest 2 View  Result Date: 10/16/2019 CLINICAL DATA:  Lung cancer and weakness EXAM: CHEST - 2 VIEW COMPARISON:  08/06/2019 CT.  PET-CT 09/08/2019 FINDINGS: Right lower chest opacification with volume loss from necrotic mass by comparison CT. Small to moderate right lateral pleural effusion has increased from prior. Left chest is clear. Stable heart size. Right port with tip at the SVC. IMPRESSION: Necrotic right lung mass as seen on recent staging scan. An overlying right pleural effusion has increased from April. Electronically Signed   By: Monte Fantasia M.D.   On: 10/16/2019 04:43   CT HEAD WO CONTRAST  Result Date: 10/16/2019 CLINICAL DATA:  Mental status change, unknown cause. Additional provided: Weakness, history of lung cancer EXAM: CT HEAD WITHOUT CONTRAST TECHNIQUE: Contiguous axial images were obtained from the base of the skull through the vertex without intravenous contrast. COMPARISON:  Brain MRI 08/10/2019 FINDINGS: Brain: Please note there is limited assessment for intracranial metastatic disease on this noncontrast CT study. Mild ill-defined hypoattenuation within the cerebral white matter is nonspecific, but consistent with chronic small vessel ischemic disease. Stable, mild generalized parenchymal atrophy. There is no acute intracranial hemorrhage. No demarcated cortical infarct. No extra-axial fluid collection. No evidence of intracranial mass. No midline shift. Vascular: No hyperdense vessel.  Atherosclerotic calcifications Skull: Normal. Negative for fracture or focal lesion. Sinuses/Orbits: Visualized orbits show no acute finding. Paranasal sinus  mucosal thickening, most notable within bilateral ethmoid air cells. No significant mastoid effusion. IMPRESSION: 1. Please note there is limited assessment for intracranial metastatic disease on this non-contrast CT study. 2. No evidence of acute intracranial abnormality. 3. Mild generalized parenchymal atrophy and chronic small vessel ischemic disease. 4. Paranasal sinus mucosal thickening, most notably ethmoid. Electronically Signed   By: Kellie Simmering DO   On: 10/16/2019 09:52   DG Chest Port 1 View  Result Date: 10/19/2019 CLINICAL DATA:  Fever and urinary complaints EXAM: PORTABLE CHEST 1 VIEW COMPARISON:  Radiograph 10/16/2019 FINDINGS: Likely increasing size of the complex right pleural effusion with associated passive atelectasis though some underlying consolidation is not excluded. A necrotic lung mass is again seen in the right middle lobe, better visualized on cross-sectional imaging. Left lung is predominantly clear aside from basilar atelectasis. Left apical scarring as well. Cardiomediastinal contours are unchanged from prior, partially obscured in the right lung base by overlying opacity. Right IJ Port-A-Cath tip terminates at the superior cavoatrial junction. Telemetry leads overlie the chest. Degenerative changes are present in the imaged spine and shoulders. IMPRESSION: 1. Likely increasing size of the complex right pleural effusion with associated passive atelectasis though some underlying consolidation is not excluded in the setting of fever. 2. Necrotic right lung mass, better visualized on cross-sectional imaging. Electronically Signed   By: March Rummage  [facility] M.D.   On: 10/19/2019 17:39   ECHOCARDIOGRAM COMPLETE  Result Date: 10/09/2019    ECHOCARDIOGRAM REPORT   Patient Name:   BREYLON SHERROW Hulbert Date of Exam: 10/09/2019 Medical Rec #:  [id number]     Height:       74.0 in Accession #:    [id number]    Weight:       179.6 lb Date of Birth:  Sep 25, 1945     BSA:          2.076 m Patient Age:    20  years      BP:           117/71 mmHg Patient Gender: M             HR:           94 bpm. Exam Location:  [place] Procedure: 2D Echo, Cardiac Doppler and Color Doppler Indications:     I48.0 PAF  History:         Patient has no prior history of Echocardiogram examinations.                  COPD and Stage IV lung cancer. Anemia, Arrythmias:Atrial                  Fibrillation; Risk Factors:Hypertension, Diabetes and Former                  Smoker.  Sonographer:     Jeneen Montgomery RDMS, RVT, RDCS Referring Phys:  [id number] Alphonse Guild BRANCH Diagnosing Phys: Kate Sable MD  Sonographer Comments: Technically difficult study due to poor echo windows. Image acquisition challenging due to COPD and Image acquisition challenging due to patient body habitus. IMPRESSIONS  1. Left ventricular ejection fraction, by estimation, is 60 to 65%. The left ventricle has normal function. Left ventricular endocardial border not optimally defined to evaluate regional wall motion. Left ventricular diastolic parameters are consistent with Grade I diastolic dysfunction (impaired relaxation).  2. Right ventricular systolic function is normal. The right ventricular size is moderately enlarged.  3. Right atrial size was mild to moderately dilated.  4. The mitral valve was not well visualized. Trivial mitral valve regurgitation.  5. Tricuspid valve regurgitation unable to assess.  6. The aortic valve was not well visualized. Aortic valve regurgitation is mild.  7. Pulmonic valve regurgitation unable to assess.  8. Aortic dilatation noted. There is mild dilatation of the aortic root. Conclusion(s)/Recommendation(s): Suboptimal study. Consider contrast enhancement for future studies. FINDINGS  Left Ventricle: Left ventricular ejection fraction, by estimation, is 60 to 65%. The left ventricle has normal function. Left ventricular endocardial border not optimally defined to evaluate regional wall motion. The left ventricular internal cavity size  was normal in size. There is no left ventricular hypertrophy. Left ventricular diastolic parameters are consistent with Grade I diastolic dysfunction (impaired relaxation). Normal left ventricular filling pressure. Right Ventricle: The right ventricular size is moderately enlarged. Right vetricular wall thickness was not assessed. Right ventricular systolic function is normal. Left Atrium: Left atrial size was normal in size. Right Atrium: Right atrial size was mild to moderately dilated. Pericardium: There is no evidence of pericardial effusion. Mitral Valve: The mitral valve was not well visualized. Trivial mitral valve regurgitation. Tricuspid Valve: The tricuspid valve is not well visualized. Tricuspid valve regurgitation unable to assess. Aortic Valve: The aortic valve was not well visualized. Aortic valve regurgitation is mild. Pulmonic Valve: The pulmonic valve was not well visualized. Pulmonic valve regurgitation  unable to assess. Aorta: Aortic dilatation noted. There is mild dilatation of the aortic root. Venous: The inferior vena cava was not well visualized. IAS/Shunts: The interatrial septum was not well visualized.  LEFT VENTRICLE PLAX 2D LVIDd:         3.88 cm      Diastology LVIDs:         2.68 cm      LV e' lateral:   5.62 cm/s LV PW:         0.96 cm      LV E/e' lateral: 11.1 LV IVS:        1.21 cm      LV e' medial:    7.86 cm/s LVOT diam:     2.00 cm      LV E/e' medial:  8.0 LV SV:         57 LV SV Index:   27 LVOT Area:     3.14 cm  LV Volumes (MOD) LV vol d, MOD A2C: 68.9 ml LV vol d, MOD A4C: 118.0 ml LV vol s, MOD A2C: 37.0 ml LV vol s, MOD A4C: 47.6 ml LV SV MOD A2C:     31.9 ml LV SV MOD A4C:     118.0 ml LV SV MOD BP:      54.6 ml RIGHT VENTRICLE RV S prime:     14.50 cm/s TAPSE (M-mode): 2.8 cm LEFT ATRIUM             Index LA diam:        2.70 cm 1.30 cm/m LA Vol (A2C):   56.7 ml 27.31 ml/m LA Vol (A4C):   40.7 ml 19.60 ml/m LA Biplane Vol:         23.90 ml/m  AORTIC VALVE LVOT  Vmax:   103.00 cm/s LVOT Vmean:  64.000 cm/s LVOT VTI:    0.181 m  AORTA Ao Root diam: 3.80 cm MITRAL VALVE               TRICUSPID VALVE MV Area (PHT):             TR Peak grad:   3.0 mmHg MV Decel Time: 143 msec    TR Vmax:        86.30 cm/s MR Peak grad: 21.8 mmHg MR Vmax:      233.50 cm/s  SHUNTS MV E velocity: 62.60 cm/s  Systemic VTI:  0.18 m MV A velocity: 70.80 cm/s  Systemic Diam: 2.00 cm MV E/A ratio:  0.88 Kate Sable MD Electronically signed by Kate Sable MD Signature Date/Time: 10/09/2019/5:58:14 PM    Final    DG HIP UNILAT WITH PELVIS 2-3 VIEWS RIGHT  Result Date: 10/18/2019 CLINICAL DATA:  Right hip pain for 5 days following fall, initial encounter EXAM: DG HIP (WITH OR WITHOUT PELVIS) 2-3V RIGHT COMPARISON:  None. 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 No acute fracture or dislocation is seen. No soft tissue abnormality is noted. IMPRESSION: Degenerative change without acute abnormality. Electronically Signed   By: Inez Catalina M.D.   On: 10/18/2019 12:21     CBC Recent Labs  Lab 10/20/19 0605 10/20/19 0605 10/20/19 2327 10/23/19 1924 10/24/19 1206 10/25/19 1159 10/26/19 0721  WBC 4.6   < > 5.7 3.4* 3.7* 2.9* 3.0*  HGB 7.2*   < > 7.4* 6.5* 9.3* 8.8* 8.5*  HCT 23.3*   < > 23.0* 21.1* 29.1* 28.7* 28.4*  PLT 74*   < > 67* 76*  87* 84* 96*  MCV 95.5   < > 93.9 95.9 90.9 92.3 93.4  MCH 29.5   < > 30.2 29.5 29.1 28.3 28.0  MCHC 30.9   < > 32.2 30.8 32.0 30.7 29.9*  RDW 17.7*   < > 17.3* 17.3* 19.0* 18.6* 17.9*  LYMPHSABS 0.3*  --  0.4* 0.3*  --   --   --   MONOABS 0.2  --  0.2 0.2  --   --   --   EOSABS 0.0  --  0.0 0.0  --   --   --   BASOSABS 0.0  --  0.0 0.0  --   --   --    < > = values in this interval not displayed.    Chemistries  Recent Labs  Lab 10/21/19 0541 10/22/19 0434 10/24/19 1206 10/25/19 1159 10/26/19 0721  NA 130* 135  134* 133* 133* 134*  K 3.2* 4.1  4.1 3.7 3.8 3.8  CL 91* 99  99 94* 94* 96*  CO2  29 27  27 28 26 28   GLUCOSE 141* 124*  123* 162* 197* 124*  BUN 12 12  12 13 13 11   CREATININE 0.69 0.64  0.59* 0.85 0.75 0.66  CALCIUM 7.9* 8.1*  8.0* 7.7* 7.6* 7.7*  MG 1.5* 2.0 1.6* 1.6* 2.1  AST 19  --   --   --   --   ALT 21  --   --   --   --   ALKPHOS 51  --   --   --   --   BILITOT 0.8  --   --   --   --    ------------------------------------------------------------------------------------------------------------------ No results for input(s): CHOL, HDL, LDLCALC, TRIG, CHOLHDL, LDLDIRECT in the last 72 hours.  Lab Results  Component Value Date   HGBA1C 7.0 (H) 08/07/2019   ------------------------------------------------------------------------------------------------------------------ No results for input(s): TSH, T4TOTAL, T3FREE, THYROIDAB in the last 72 hours.  Invalid input(s): FREET3 ------------------------------------------------------------------------------------------------------------------ No results for input(s): VITAMINB12, FOLATE, FERRITIN, TIBC, IRON, RETICCTPCT in the last 72 hours.  Coagulation profile No results for input(s): INR, PROTIME in the last 168 hours. No results for input(s): DDIMER in the last 72 hours.  Cardiac Enzymes No results for input(s): CKMB, TROPONINI, MYOGLOBIN in the last 168 hours.  Invalid input(s): CK ------------------------------------------------------------------------------------------------------------------    Component Value Date/Time   BNP 104.0 (H) 08/06/2019 1211   Roxan Hockey M.D on 10/26/2019 at 7:08 PM  Go to [email or web address] - for contact info  [facility] - Office  [phone]

## 2019-10-27 ENCOUNTER — Telehealth: Payer: Self-pay | Admitting: Medical Oncology

## 2019-10-27 LAB — GLUCOSE, CAPILLARY
Glucose-Capillary: 106 mg/dL — ABNORMAL HIGH (ref 70–99)
Glucose-Capillary: 202 mg/dL — ABNORMAL HIGH (ref 70–99)
Glucose-Capillary: 138 mg/dL — ABNORMAL HIGH (ref 70–99)
Glucose-Capillary: 109 mg/dL — ABNORMAL HIGH (ref 70–99)
Glucose-Capillary: 114 mg/dL — ABNORMAL HIGH (ref 70–99)

## 2019-10-27 MED ORDER — AMOXICILLIN-POT CLAVULANATE 875-125 MG PO TABS
1.0000 | ORAL_TABLET | Freq: Two times a day (BID) | ORAL | Status: DC
  Administered 2019-10-27 – 2019-10-28 (×3): 1 via ORAL
  Filled 2019-10-27 (×3): qty 1

## 2019-10-27 NOTE — NC FL2 (Signed)
MEDICAID FL2 LEVEL OF CARE SCREENING TOOL     IDENTIFICATION  Patient Name: Jeremy [name] Birthdate: December 17, 1945 Sex: male Admission Date (Current Location): 10/19/2019  [facility] and [place] Number:  [employer] and Address:  [facility] [address] [place]      Provider Number: [id number]  Attending Physician Name and Address:  Roxan Hockey, MD  Relative Name and Phone Number:       Current Level of Care: Hospital Recommended Level of Care: [place] Prior Approval Number:    Date Approved/Denied:   PASRR Number: [id number]  Discharge Plan: SNF    Current Diagnoses: Patient Active Problem List   Diagnosis Date Noted  . Sepsis due to undetermined organism ([place]) 10/19/2019  . Thrombocytopenia ([place]) 10/19/2019  . Failure to thrive in adult 10/16/2019  . Generalized weakness 10/16/2019  . Drug-induced skin rash 09/23/2019  . 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[place]) 08/26/2019  . Encounter for antineoplastic chemotherapy 08/26/2019  . Encounter for antineoplastic immunotherapy 08/26/2019  . Goals of care, counseling/discussion 08/26/2019  . Atrial fibrillation with RVR ([place])   . Protein-calorie malnutrition, severe 08/07/2019  . Hemoptysis 08/06/2019  . Mass of right lung 08/06/2019  . Former smoker 08/06/2019  . Lung mass 08/06/2019  . Postobstructive pneumonia 08/06/2019  . COPD (chronic obstructive pulmonary disease) ([place])   . HTN (hypertension)   . Abnormal weight loss   . Leukocytosis   . Acute and chronic respiratory failure with hypoxia ([place])   . Anemia in neoplastic disease   . Hyperglycemia   . Heme + stool 11/06/2018  . Type 2 diabetes mellitus ([place]) 2018    Orientation RESPIRATION BLADDER Height & Weight     Self, Time, Situation, Place  O2(see dc summary) Continent Weight: 180 lb (81.6 kg) Height:  6\' 2"  (188 cm)  BEHAVIORAL SYMPTOMS/MOOD NEUROLOGICAL BOWEL  NUTRITION STATUS      Continent Diet(see dc summary)  AMBULATORY STATUS COMMUNICATION OF NEEDS Skin   Extensive Assist Verbally Normal                       Personal Care Assistance Level of Assistance  Bathing, Feeding, Dressing Bathing Assistance: Limited assistance Feeding assistance: Independent Dressing Assistance: Limited assistance     Functional Limitations Info  Sight, Speech, Hearing Sight Info: Adequate Hearing Info: Adequate Speech Info: Adequate    SPECIAL CARE FACTORS FREQUENCY  PT (By licensed PT), OT (By licensed OT)     PT Frequency: 5x week OT Frequency: 3x week            Contractures Contractures Info: Not present    Additional Factors Info  Code Status, Allergies Code Status Info: DNR Allergies Info: Chlorhexidine, Aleve           Current Medications (10/27/2019):  This is the current hospital active medication list Current Facility-Administered Medications  Medication Dose Route Frequency Provider Last Rate Last Admin  . acetaminophen (TYLENOL) tablet 650 mg  650 mg Oral Q6H PRN [name], MD   650 mg at 10/26/19 1513  . amoxicillin-clavulanate (AUGMENTIN) 875-125 MG per tablet 1 tablet  1 tablet Oral Q12H Roxan Hockey, MD   1 tablet at 10/27/19 1203  . ascorbic acid (VITAMIN C) tablet 1,000 mg  1,000 mg Oral Daily [name], MD   1,000 mg at 10/27/19 0919  . diltiazem (CARDIZEM CD) 24 hr capsule 360 mg  360  mg Oral Daily Roxan Hockey, MD   360 mg at 10/27/19 0918  . ferrous sulfate tablet 325 mg  325 mg Oral Q supper [name], MD   325 mg at 10/26/19 1704  . folic acid (FOLVITE) tablet 1 mg  1 mg Oral Daily [name], MD   1 mg at 10/27/19 0919  . gabapentin (NEURONTIN) capsule 600 mg  600 mg Oral QHS [name], MD   600 mg at 10/26/19 2150  . insulin aspart (novoLOG) injection 0-15 Units  0-15 Units Subcutaneous TID WC [name], MD   5 Units at 10/27/19 1203  .  levalbuterol (XOPENEX) nebulizer solution 1.25 mg  1.25 mg Nebulization Q8H PRN Emokpae, Courage, MD      . lidocaine-prilocaine (EMLA) cream   Topical PRN [name], MD      . loratadine (CLARITIN) tablet 10 mg  10 mg Oral Daily [name], MD   10 mg at 10/27/19 0919  . melatonin tablet 4.5 mg  4.5 mg Oral QHS Merlene Laughter F, NP   4.5 mg at 10/26/19 2149  . metFORMIN (GLUCOPHAGE-XR) 24 hr tablet 1,000 mg  1,000 mg Oral Q breakfast [name], MD   1,000 mg at 10/27/19 4967  . metFORMIN (GLUCOPHAGE-XR) 24 hr tablet 500 mg  500 mg Oral Q supper [name], MD   500 mg at 10/26/19 1704  . metoprolol tartrate (LOPRESSOR) tablet 25 mg  25 mg Oral BID Roxan Hockey, MD   25 mg at 10/27/19 0919  . pantoprazole (PROTONIX) EC tablet 40 mg  40 mg Oral Daily [name], MD   40 mg at 10/27/19 0919  . phosphorus (K PHOS NEUTRAL) tablet 500 mg  500 mg Oral BID Roxan Hockey, MD   500 mg at 10/27/19 0918  . sodium chloride flush (NS) 0.9 % injection 3 mL  3 mL Intravenous Q12H Emokpae, Courage, MD   3 mL at 10/26/19 0901  . traMADol (ULTRAM) tablet 50 mg  50 mg Oral Q4H PRN [name], MD   50 mg at 10/26/19 1015  . triamcinolone cream (KENALOG) 0.1 %   Topical QID [name], MD   Given at 10/27/19 1204     Discharge Medications: Please see discharge summary for a list of discharge medications.  Relevant Imaging Results:  Relevant Lab Results:   Additional Information SSN: [id number]

## 2019-10-27 NOTE — TOC Progression Note (Signed)
Transition of Care [facility]) - Progression Note    Patient Details  Name: Jeremy [name] MRN: [id number] Date of Birth: 09-22-1945  [facility]) CM/SW Contact  Shade Flood, LCSW Phone Number: 10/27/2019, 12:03 PM  Clinical Narrative:     PT recommending SNF and per MD, pt's wife agrees that she cannot help him at home right now due to pt's level of weakness. Met with pt's wife at bedside. Pt sleeping soundly at the time of LCSW visit. Discussed SNF referrals and available options. Pt's wife requests referrals to [place] facilities. Will refer and continue to follow.  Expected Discharge Plan: [place] Barriers to Discharge: Continued Medical Work up  Expected Discharge Plan and Services Expected Discharge Plan: [place] In-house Referral: Clinical Social Work     Living arrangements for the past 2 months: Single Family Home                                       Social Determinants of Health (SDOH) Interventions    Readmission Risk Interventions Readmission Risk Prevention Plan 10/20/2019  Transportation Screening Complete  HRI or [place] Complete  Social Work Consult for [place] Planning/Counseling Complete  Palliative Care Screening Not Applicable  Medication Review [employer]) Complete  Some recent data might be hidden

## 2019-10-27 NOTE — Care Management Important Message (Signed)
Important Message  Patient Details  Name: Jeremy [name] MRN: [id number] Date of Birth: 1946-02-04   Medicare Important Message Given:  Yes     Tommy Medal 10/27/2019, 2:37 PM

## 2019-10-27 NOTE — Progress Notes (Signed)
Physical Therapy Treatment Patient Details Name: Jeremy [name] MRN: [id number] DOB: 29-Nov-1945 Today's Date: 10/27/2019    History of Present Illness ARGUS CARAHER is a 74 y.o. male with medical history significant of right lung cancer, COPD, type 2 diabetes, hypertension, psoriatic arthritis  who was discharged from [facility] after being admitted from 10/15/2019 until 10/17/2019 due to generalized weakness and fall.  He is coming today to [facility] due to fever and frequency without hematuria.  He has also have mild confusion.  He denies headache, sore throat, rhinorrhea, hemoptysis, but has occasional wheezing and very frequent productive cough of whitish sputum since he was diagnosed with the lung mass.  He denies chest pain, palpitations, dizziness, diaphoresis, PND, orthopnea or pitting edema lower extremities.  No he had just undergone his third cycle of palliative chemotherapy on 10/14/2019.  He has a generalized macular papular rash due to [facility], which has been discontinued.    PT Comments    Patient demonstrates slow labored movement with Min assist to complete sit to stands from chair due to BLE weakness, slightly labored cadence without loss of balance during ambulation, limited mostly due to fatigue and SOB while on 1 LPM O2 and requested to go back to bed due to fatigue after therapy - his spouse in room.  Patient will benefit from continued physical therapy in hospital and recommended venue below to increase strength, balance, endurance for safe ADLs and gait.    Follow Up Recommendations  SNF     Equipment Recommendations  Rolling walker with 5" wheels    Recommendations for Other Services       Precautions / Restrictions Precautions Precautions: Fall Restrictions Weight Bearing Restrictions: No    Mobility  Bed Mobility Overal bed mobility: Needs Assistance Bed Mobility: Supine to Sit;Sit to Supine     Supine to sit: Min guard;Min assist Sit to  supine: Supervision   General bed mobility comments: increased time, labored movement  Transfers Overall transfer level: Needs assistance Equipment used: Rolling walker (2 wheeled) Transfers: Sit to/from [facility] Sit to Stand: Min assist Stand pivot transfers: Min assist       General transfer comment: difficulty with sit to stands due to BLE weakness  Ambulation/Gait Ambulation/Gait assistance: Min guard;Min assist Gait Distance (Feet): 60 Feet Assistive device: Rolling walker (2 wheeled) Gait Pattern/deviations: Decreased step length - right;Decreased step length - left;Decreased stride length Gait velocity: decreased   General Gait Details: slow labored unsteady cadence without loss of balance, limited secondary to c/o fatigue, on 1 LPM O2   Stairs             Wheelchair Mobility    Modified Rankin (Stroke Patients Only)       Balance Overall balance assessment: Needs assistance Sitting-balance support: Feet supported;No upper extremity supported Sitting balance-Leahy Scale: Good Sitting balance - Comments: seated at EOB   Standing balance support: During functional activity;Bilateral upper extremity supported Standing balance-Leahy Scale: Fair                              Cognition Arousal/Alertness: Awake/alert Behavior During Therapy: WFL for tasks assessed/performed Overall Cognitive Status: Within Functional Limits for tasks assessed                                        Exercises  General Exercises - Lower Extremity Ankle Circles/Pumps: Seated;AROM;Strengthening;Both;10 reps Long Arc Quad: Seated;AROM;Strengthening;Both;10 reps    General Comments        Pertinent Vitals/Pain Pain Assessment: No/denies pain    Home Living                      Prior Function            PT Goals (current goals can now be found in the care plan section) Acute Rehab PT Goals Patient Stated Goal:  increase strength before going home PT Goal Formulation: With patient/family Time For Goal Achievement: 11/07/19 Potential to Achieve Goals: Good Progress towards PT goals: Progressing toward goals    Frequency    Min 3X/week      PT Plan Current plan remains appropriate    Co-evaluation              AM-PAC PT "6 Clicks" Mobility   Outcome Measure  Help needed turning from your back to your side while in a flat bed without using bedrails?: None Help needed moving from lying on your back to sitting on the side of a flat bed without using bedrails?: A Little Help needed moving to and from a bed to a chair (including a wheelchair)?: A Little Help needed standing up from a chair using your arms (e.g., wheelchair or bedside chair)?: A Little Help needed to walk in hospital room?: A Little Help needed climbing 3-5 steps with a railing? : A Lot 6 Click Score: 18    End of Session Equipment Utilized During Treatment: Gait belt;Oxygen Activity Tolerance: Patient tolerated treatment well;Patient limited by fatigue Patient left: in bed;with call bell/phone within reach;with family/visitor present Nurse Communication: Mobility status PT Visit Diagnosis: Unsteadiness on feet (R26.81);History of falling (Z91.81);Difficulty in walking, not elsewhere classified (R26.2)     Time: 5056-9794 PT Time Calculation (min) (ACUTE ONLY): 20 min  Charges:  $Gait Training: 8-22 mins $Therapeutic Exercise: 8-22 mins                     10:59 AM, 10/27/19 Lonell Grandchild, MPT Physical Therapist with [facility] 336 [phone] office [phone] mobile phone

## 2019-10-27 NOTE — Telephone Encounter (Signed)
Pt still in hospital and going to rehab. Wife said to keep appt for CT on 5/28.

## 2019-10-27 NOTE — Progress Notes (Signed)
Patient Demographics:    Jeremy [name], is a 74 y.o. male, DOB - 1945/06/14, YBW:[id number]  Admit date - 10/19/2019   Admitting Physician [name], MD  Outpatient Primary MD for the patient is [name], [name], MD  LOS - 8   Chief Complaint  Patient presents with  . Code Sepsis        Subjective:    Jeremy [name] today has  no emesis,  No chest pain,    -Wife at bedside,   -PT eval noted, patient very weak requires SNF rehab  Assessment  & Plan :    Principal Problem:   Sepsis due to undetermined organism [facility]) Active Problems:   COPD (chronic obstructive pulmonary disease) ([place])   Type 2 diabetes mellitus (HCC)   HTN (hypertension)   Anemia in neoplastic disease   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HCC)   Thrombocytopenia ([place])  Brief Summary:- 74 y.o. male with medical history significant of right lung cancer, COPD, type 2 diabetes, hypertension, psoriatic arthritis, with status post palliative chemo 10/14/2019 admitted on 10/19/2019 with pneumonia concerns about sepsis -Found to have tachyarrhythmia --PT eval noted, patient very weak requires SNF rehab  A/p  1)Sepsis of urinary source---   -No further high fevers  -Stopped Unasyn and started Augmentin for Enterococcus and concerns about pneumonia -Treated with Bactrim for staph in urine -Urine culture from 10/19/2019 with Enterococcus and Staphylococcus hemolyticus -Blood cultures 5/ 20/21 and repeat blood cultures from 10/23/2019 NGTD - stopped vancomycin as MRSA negative Antimicrobials this admission: 5/16 Cefepime >> 5/19 5/16 Vancomycin >> 5/19 5/16 Metronidazole x 1  5/19 Unasyn >>10/26/19 5/19 TMP/SMX PO >> 10/27/19-augmentin  Microbiology results: 5/16 BCx X 2: NGTD 5/16 UCx: >100,000 CFU/ml E. faecalis pan sensitive; >100,000 CFU/ml Staph haemolyticus susceptible to cipro, clinda, tetracycline,  vancomycin, TMP/SMX; resistant to oxacillin 5/16 COVID: negative 5/13 UCx: NG/final 5/18 MRSA PCR: negative  -Watch renal function closely while on Bactrim  repeat blood cultures from 10/23/2019 NGTD   2)DM2-recent A1c 7.0 reflecting fair diabetic control PTA -Continue Metformin and Use Novolog/Humalog Sliding scale insulin with Accu-Cheks/Fingersticks as ordered   3)Stage IV squamous cell carcinoma of right lung - Necrotic right lung mass, last palliative chemo on 10/14/2019--- [name] who is patient's oncologist notified of admission to the hospital -Keytruda stopped b/c of rash  4)Chronic Neoplastic Anemia and Thrombocytopenia  in the patient with underlying malignancy currently undergoing chemotherapy--- monitor closely and transfuse as clinically indicated -Platelets up to 76 from 67, no bleeding noted, hemoglobin down to 6.5 from 7.4--baseline Hgb usually above 8 -Transfused 2 units of PRBC on 10/23/2019 -Repeat CBC pending -- 5)HTN=-continue Cardizem  6)COPD----antibiotics as above #1, continue bronchodilators and mucolytics  7)Acute on chronic  Hypoxic Respiratory Failure--- PTA patient was on 2 L of oxygen at home, -- suspect due to #1 and #6 above as well as #3 -Oxygen requirement appears to be back to baseline at this time  8)PAFib/PAT/PSVT-- CHA2DS2- VASc score   is = 3 (HTN, DM and age)   Which is  equal to = 3.2 % annual risk of stroke -Patient is not on anticoagulation due to chronic anemia requiring transfusion from time to time -Discussed with Dr. [name] from cardiology service, -  Patient's EF is 60 to 65%,  -Rate control remains very very challenging  PTA patient was on Cardizem 30 mg twice daily  -Currently titrated up to Cardizem 360 daily  -Tachycardia persist patient is symptomatic with dizziness dyspnea on exertion palpitations and shortness of breath  -Received additional doses of short acting Cardizem rate control continues to remain difficult -Continue  metoprolol 25 mg twice daily for better rate control -He may need amiodarone  9)HFpEF--patient with history of chronic diastolic dysfunction CHF with preserved EF over 60 to 65%, appears euvolemic at this time --Rate control as above in #8  11) hypokalemia and hypomagnesemia----replaced and normalized =- 12) generalized weakness/deconditioning/ambulatory dysfunction--- Initial physical therapy eval appreciated -Patient is very weak and deconditioned--unable to get up even with a walker --PT eval noted, patient very weak requires SNF rehab  Disposition/Need for in-Hospital Stay- patient unable to be discharged at this time due to ----awaiting SNF placement -Patient From: home D/C Place: awaiting SNF placement Barriers: awaiting SNF placement  Code Status : DNR  Family Communication:   (patient is alert, awake and coherent) -Discussed with patient's wife at bedside  Consults  :  na DVT Prophylaxis  :   - SCDs low-platelets  Lab Results  Component Value Date   PLT 96 (L) 10/26/2019    Inpatient Medications  Scheduled Meds: . amoxicillin-clavulanate  1 tablet Oral Q12H  . vitamin C  1,000 mg Oral Daily  . diltiazem  360 mg Oral Daily  . ferrous sulfate  325 mg Oral Q supper  . folic acid  1 mg Oral Daily  . gabapentin  600 mg Oral QHS  . insulin aspart  0-15 Units Subcutaneous TID WC  . loratadine  10 mg Oral Daily  . melatonin  4.5 mg Oral QHS  . metFORMIN  1,000 mg Oral Q breakfast  . metFORMIN  500 mg Oral Q supper  . metoprolol tartrate  25 mg Oral BID  . pantoprazole  40 mg Oral Daily  . phosphorus  500 mg Oral BID  . sodium chloride flush  3 mL Intravenous Q12H  . triamcinolone cream   Topical QID   Continuous Infusions:  PRN Meds:.acetaminophen, levalbuterol, lidocaine-prilocaine, traMADol    Anti-infectives (From admission, onward)   Start     Dose/Rate Route Frequency Ordered Stop   10/27/19 1300  amoxicillin-clavulanate (AUGMENTIN) 875-125 MG per tablet  1 tablet     1 tablet Oral Every 12 hours 10/27/19 1059     10/22/19 2000  Ampicillin-Sulbactam (UNASYN) 3 g in sodium chloride 0.9 % 100 mL IVPB  Status:  Discontinued     3 g 200 mL/hr over 30 Minutes Intravenous Every 6 hours 10/22/19 1932 10/27/19 1058   10/22/19 2000  sulfamethoxazole-trimethoprim (BACTRIM DS) 800-160 MG per tablet 1 tablet     1 tablet Oral Every 12 hours 10/22/19 1955 10/24/19 1021   10/20/19 0200  ceFEPIme (MAXIPIME) 2 g in sodium chloride 0.9 % 100 mL IVPB  Status:  Discontinued     2 g 200 mL/hr over 30 Minutes Intravenous Every 8 hours 10/19/19 1802 10/22/19 1857   10/20/19 0000  vancomycin (VANCOCIN) IVPB 1000 mg/200 mL premix  Status:  Discontinued     1,000 mg 200 mL/hr over 60 Minutes Intravenous Every 8 hours 10/19/19 1802 10/22/19 1039   10/19/19 1745  ceFEPIme (MAXIPIME) 2 g in sodium chloride 0.9 % 100 mL IVPB     2 g 200 mL/hr over 30 Minutes Intravenous  Once 10/19/19  1741 10/19/19 1831   10/19/19 1745  metroNIDAZOLE (FLAGYL) IVPB 500 mg     500 mg 100 mL/hr over 60 Minutes Intravenous  Once 10/19/19 1741 10/19/19 1933   10/19/19 1745  vancomycin (VANCOCIN) IVPB 1000 mg/200 mL premix     1,000 mg 200 mL/hr over 60 Minutes Intravenous  Once 10/19/19 1741 10/19/19 2050        Objective:   Vitals:   10/27/19 0446 10/27/19 1100 10/27/19 1413 10/27/19 2009  BP: (!) 145/80 (!) 106/58 131/71 (!) 149/78  Pulse: 99 75 (!) 101 (!) 102  Resp: 16  20 20   Temp: 99.1 F (37.3 C)  99.1 F (37.3 C) 99.3 F (37.4 C)  TempSrc: Oral  Oral Oral  SpO2: 91%  94% 96%  Weight:      Height:        Wt Readings from Last 3 Encounters:  10/19/19 81.6 kg  10/16/19 81.6 kg  10/14/19 82.7 kg     Intake/Output Summary (Last 24 hours) at 10/27/2019 2030 Last data filed at 10/27/2019 1900 Gross per 24 hour  Intake 721 ml  Output 800 ml  Net -79 ml   Physical Exam  Gen:- Awake Alert,  In no apparent distress  HEENT:- [place].AT, No sclera icterus Nose- [place]  2L/min Neck-Supple Neck,No JVD,.  Lungs-diminished breath sounds with scattered rhonchi,  CV- S1, S2 normal,irregular , tachycardia persist Port-A-Cath site is clean dry and intact Abd-  +ve B.Sounds, Abd Soft, No tenderness,    Extremity/Skin:- No  edema, pedal pulses present Psych-affect is appropriate, oriented x3 Neuro-generalized weakness and deconditioning, no new focal deficits, no tremors   Data Review:   Micro Results Recent Results (from the past 240 hour(s))  Culture, blood (Routine x 2)     Status: None   Collection Time: 10/19/19  5:25 PM   Specimen: BLOOD LEFT ARM  Result Value Ref Range Status   Specimen Description BLOOD LEFT ARM  Final   Special Requests   Final    BOTTLES DRAWN AEROBIC AND ANAEROBIC Blood Culture adequate volume   Culture   Final    NO GROWTH 5 DAYS Performed at [facility], [address] [place], [place] [address]    Report Status 10/24/2019 FINAL  Final  Urine culture     Status: Abnormal   Collection Time: 10/19/19  5:41 PM   Specimen: In/Out Cath Urine  Result Value Ref Range Status   Specimen Description   Final    IN/OUT CATH URINE Performed at [facility], [address], [place], [place] [address]    Special Requests   Final    NONE Performed at [facility], [address], [place], [place] [address]    Culture (A)  Final    >=100,000 COLONIES/mL ENTEROCOCCUS FAECALIS >=100,000 COLONIES/mL STAPHYLOCOCCUS HAEMOLYTICUS    Report Status 10/22/2019 FINAL  Final   Organism ID, Bacteria ENTEROCOCCUS FAECALIS (A)  Final   Organism ID, Bacteria STAPHYLOCOCCUS HAEMOLYTICUS (A)  Final      Susceptibility   Enterococcus faecalis - MIC*    AMPICILLIN <=2 SENSITIVE Sensitive     NITROFURANTOIN <=16 SENSITIVE Sensitive     VANCOMYCIN 2 SENSITIVE Sensitive     * >=100,000 COLONIES/mL ENTEROCOCCUS FAECALIS   Staphylococcus haemolyticus - MIC*    CIPROFLOXACIN <=0.5 SENSITIVE Sensitive     GENTAMICIN <=0.5 SENSITIVE Sensitive      NITROFURANTOIN <=16 SENSITIVE Sensitive     OXACILLIN >=4 RESISTANT Resistant     TETRACYCLINE <=1 SENSITIVE  Sensitive     VANCOMYCIN 1 SENSITIVE Sensitive     TRIMETH/SULFA <=10 SENSITIVE Sensitive     CLINDAMYCIN <=0.25 SENSITIVE Sensitive     RIFAMPIN <=0.5 SENSITIVE Sensitive     Inducible Clindamycin NEGATIVE Sensitive     * >=100,000 COLONIES/mL STAPHYLOCOCCUS HAEMOLYTICUS  Culture, blood (Routine x 2)     Status: None   Collection Time: 10/19/19  5:50 PM   Specimen: BLOOD LEFT WRIST  Result Value Ref Range Status   Specimen Description BLOOD LEFT WRIST  Final   Special Requests   Final    BOTTLES DRAWN AEROBIC AND ANAEROBIC Blood Culture adequate volume   Culture   Final    NO GROWTH 5 DAYS Performed at [facility], [address] [place], [place] [address]    Report Status 10/24/2019 FINAL  Final  SARS Coronavirus 2 by RT PCR (hospital order, performed in [facility] hospital lab) Nasopharyngeal Nasopharyngeal Swab     Status: None   Collection Time: 10/19/19  8:17 PM   Specimen: Nasopharyngeal Swab  Result Value Ref Range Status   SARS Coronavirus 2 NEGATIVE NEGATIVE Final    Comment: (NOTE) SARS-CoV-2 target nucleic acids are NOT DETECTED. The SARS-CoV-2 RNA is generally detectable in upper and lower respiratory specimens during the acute phase of infection. The lowest concentration of SARS-CoV-2 viral copies this assay can detect is 250 copies / mL. A negative result does not preclude SARS-CoV-2 infection and should not be used as the sole basis for treatment or other patient management decisions.  A negative result may occur with improper specimen collection / handling, submission of specimen other than nasopharyngeal swab, presence of viral mutation(s) within the areas targeted by this assay, and inadequate number of viral copies (<250 copies / mL). A negative result must be combined with clinical observations, patient history, and epidemiological  information. Fact Sheet for Patients:   StrictlyIdeas.no Fact Sheet for Healthcare Providers: BankingDealers.co.za This test is not yet approved or cleared  by the [place] FDA and has been authorized for detection and/or diagnosis of SARS-CoV-2 by FDA under an Emergency Use Authorization (EUA).  This EUA will remain in effect (meaning this test can be used) for the duration of the COVID-19 declaration under Section 564(b)(1) of the Act, 21 U.S.C. section 360bbb-3(b)(1), unless the authorization is terminated or revoked sooner. Performed at [facility], [address], [place], [place] [address]   MRSA PCR Screening     Status: None   Collection Time: 10/21/19  3:16 PM   Specimen: Nasal Mucosa; Nasopharyngeal  Result Value Ref Range Status   MRSA by PCR NEGATIVE NEGATIVE Final    Comment:        The GeneXpert MRSA Assay (FDA approved for NASAL specimens only), is one component of a comprehensive MRSA colonization surveillance program. It is not intended to diagnose MRSA infection nor to guide or monitor treatment for MRSA infections. Performed at [facility], [address], [place], [place] [address]   Culture, blood (Routine X 2) w Reflex to ID Panel     Status: None (Preliminary result)   Collection Time: 10/23/19  8:16 PM   Specimen: BLOOD LEFT HAND  Result Value Ref Range Status   Specimen Description BLOOD LEFT HAND  Final   Special Requests   Final    Blood Culture adequate volume BOTTLES DRAWN AEROBIC ONLY   Culture   Final    NO GROWTH 4 DAYS Performed at [facility], [address] [place],  [place] [address]    Report Status PENDING  Incomplete  Culture, blood (Routine X 2) w Reflex to ID Panel     Status: None (Preliminary result)   Collection Time: 10/23/19  8:17 PM   Specimen: BLOOD LEFT WRIST  Result Value Ref Range Status   Specimen Description BLOOD LEFT WRIST  Final   Special Requests   Final     BOTTLES DRAWN AEROBIC AND ANAEROBIC Blood Culture adequate volume   Culture   Final    NO GROWTH 4 DAYS Performed at [facility], [address], [place], [place] [address]    Report Status PENDING  Incomplete    Radiology Reports DG Chest 2 View  Result Date: 10/16/2019 CLINICAL DATA:  Lung cancer and weakness EXAM: CHEST - 2 VIEW COMPARISON:  08/06/2019 CT.  PET-CT 09/08/2019 FINDINGS: Right lower chest opacification with volume loss from necrotic mass by comparison CT. Small to moderate right lateral pleural effusion has increased from prior. Left chest is clear. Stable heart size. Right port with tip at the SVC. IMPRESSION: Necrotic right lung mass as seen on recent staging scan. An overlying right pleural effusion has increased from April. Electronically Signed   By: Monte Fantasia M.D.   On: 10/16/2019 04:43   CT HEAD WO CONTRAST  Result Date: 10/16/2019 CLINICAL DATA:  Mental status change, unknown cause. Additional provided: Weakness, history of lung cancer EXAM: CT HEAD WITHOUT CONTRAST TECHNIQUE: Contiguous axial images were obtained from the base of the skull through the vertex without intravenous contrast. COMPARISON:  Brain MRI 08/10/2019 FINDINGS: Brain: Please note there is limited assessment for intracranial metastatic disease on this noncontrast CT study. Mild ill-defined hypoattenuation within the cerebral white matter is nonspecific, but consistent with chronic small vessel ischemic disease. Stable, mild generalized parenchymal atrophy. There is no acute intracranial hemorrhage. No demarcated cortical infarct. No extra-axial fluid collection. No evidence of intracranial mass. No midline shift. Vascular: No hyperdense vessel.  Atherosclerotic calcifications Skull: Normal. Negative for fracture or focal lesion. Sinuses/Orbits: Visualized orbits show no acute finding. Paranasal sinus mucosal thickening, most notable within bilateral ethmoid air cells. No significant mastoid effusion.  IMPRESSION: 1. Please note there is limited assessment for intracranial metastatic disease on this non-contrast CT study. 2. No evidence of acute intracranial abnormality. 3. Mild generalized parenchymal atrophy and chronic small vessel ischemic disease. 4. Paranasal sinus mucosal thickening, most notably ethmoid. Electronically Signed   By: Kellie Simmering DO   On: 10/16/2019 09:52   DG Chest Port 1 View  Result Date: 10/19/2019 CLINICAL DATA:  Fever and urinary complaints EXAM: PORTABLE CHEST 1 VIEW COMPARISON:  Radiograph 10/16/2019 FINDINGS: Likely increasing size of the complex right pleural effusion with associated passive atelectasis though some underlying consolidation is not excluded. A necrotic lung mass is again seen in the right middle lobe, better visualized on cross-sectional imaging. Left lung is predominantly clear aside from basilar atelectasis. Left apical scarring as well. Cardiomediastinal contours are unchanged from prior, partially obscured in the right lung base by overlying opacity. Right IJ Port-A-Cath tip terminates at the superior cavoatrial junction. Telemetry leads overlie the chest. Degenerative changes are present in the imaged spine and shoulders. IMPRESSION: 1. Likely increasing size of the complex right pleural effusion with associated passive atelectasis though some underlying consolidation is not excluded in the setting of fever. 2. Necrotic right lung mass, better visualized on cross-sectional imaging. Electronically Signed   By: Lovena Le M.D.   On: 10/19/2019 17:39   ECHOCARDIOGRAM COMPLETE  Result Date: 10/09/2019    ECHOCARDIOGRAM REPORT   Patient Name:   NAVIN DOGAN Smestad Date of Exam: 10/09/2019 Medical Rec #:  [id number]     Height:       74.0 in Accession #:    [id number]    Weight:       179.6 lb Date of Birth:  1945/08/01     BSA:          2.076 m Patient Age:    5 years      BP:           117/71 mmHg Patient Gender: M             HR:           94 bpm. Exam Location:   [place] Procedure: 2D Echo, Cardiac Doppler and Color Doppler Indications:     I48.0 PAF  History:         Patient has no prior history of Echocardiogram examinations.                  COPD and Stage IV lung cancer. Anemia, Arrythmias:Atrial                  Fibrillation; Risk Factors:Hypertension, Diabetes and Former                  Smoker.  Sonographer:     Jeneen Montgomery RDMS, RVT, RDCS Referring Phys:  [id number] Alphonse Guild BRANCH Diagnosing Phys: Kate Sable MD  Sonographer Comments: Technically difficult study due to poor echo windows. Image acquisition challenging due to COPD and Image acquisition challenging due to patient body habitus. IMPRESSIONS  1. Left ventricular ejection fraction, by estimation, is 60 to 65%. The left ventricle has normal function. Left ventricular endocardial border not optimally defined to evaluate regional wall motion. Left ventricular diastolic parameters are consistent with Grade I diastolic dysfunction (impaired relaxation).  2. Right ventricular systolic function is normal. The right ventricular size is moderately enlarged.  3. Right atrial size was mild to moderately dilated.  4. The mitral valve was not well visualized. Trivial mitral valve regurgitation.  5. Tricuspid valve regurgitation unable to assess.  6. The aortic valve was not well visualized. Aortic valve regurgitation is mild.  7. Pulmonic valve regurgitation unable to assess.  8. Aortic dilatation noted. There is mild dilatation of the aortic root. Conclusion(s)/Recommendation(s): Suboptimal study. Consider contrast enhancement for future studies. FINDINGS  Left Ventricle: Left ventricular ejection fraction, by estimation, is 60 to 65%. The left ventricle has normal function. Left ventricular endocardial border not optimally defined to evaluate regional wall motion. The left ventricular internal cavity size was normal in size. There is no left ventricular hypertrophy. Left ventricular diastolic parameters are  consistent with Grade I diastolic dysfunction (impaired relaxation). Normal left ventricular filling pressure. Right Ventricle: The right ventricular size is moderately enlarged. Right vetricular wall thickness was not assessed. Right ventricular systolic function is normal. Left Atrium: Left atrial size was normal in size. Right Atrium: Right atrial size was mild to moderately dilated. Pericardium: There is no evidence of pericardial effusion. Mitral Valve: The mitral valve was not well visualized. Trivial mitral valve regurgitation. Tricuspid Valve: The tricuspid valve is not well visualized. Tricuspid valve regurgitation unable to assess. Aortic Valve: The aortic valve was not well visualized. Aortic valve regurgitation is mild. Pulmonic Valve: The pulmonic valve was not well visualized. Pulmonic valve regurgitation unable to assess. Aorta: Aortic dilatation noted. There is mild dilatation of  the aortic root. Venous: The inferior vena cava was not well visualized. IAS/Shunts: The interatrial septum was not well visualized.  LEFT VENTRICLE PLAX 2D LVIDd:         3.88 cm      Diastology LVIDs:         2.68 cm      LV e' lateral:   5.62 cm/s LV PW:         0.96 cm      LV E/e' lateral: 11.1 LV IVS:        1.21 cm      LV e' medial:    7.86 cm/s LVOT diam:     2.00 cm      LV E/e' medial:  8.0 LV SV:         57 LV SV Index:   27 LVOT Area:     3.14 cm  LV Volumes (MOD) LV vol d, MOD A2C: 68.9 ml LV vol d, MOD A4C: 118.0 ml LV vol s, MOD A2C: 37.0 ml LV vol s, MOD A4C: 47.6 ml LV SV MOD A2C:     31.9 ml LV SV MOD A4C:     118.0 ml LV SV MOD BP:      54.6 ml RIGHT VENTRICLE RV S prime:     14.50 cm/s TAPSE (M-mode): 2.8 cm LEFT ATRIUM             Index LA diam:        2.70 cm 1.30 cm/m LA Vol (A2C):   56.7 ml 27.31 ml/m LA Vol (A4C):   40.7 ml 19.60 ml/m LA Biplane Vol:         23.90 ml/m  AORTIC VALVE LVOT Vmax:   103.00 cm/s LVOT Vmean:  64.000 cm/s LVOT VTI:    0.181 m  AORTA Ao Root diam: 3.80 cm MITRAL VALVE                TRICUSPID VALVE MV Area (PHT):             TR Peak grad:   3.0 mmHg MV Decel Time: 143 msec    TR Vmax:        86.30 cm/s MR Peak grad: 21.8 mmHg MR Vmax:      233.50 cm/s  SHUNTS MV E velocity: 62.60 cm/s  Systemic VTI:  0.18 m MV A velocity: 70.80 cm/s  Systemic Diam: 2.00 cm MV E/A ratio:  0.88 Kate Sable MD Electronically signed by Kate Sable MD Signature Date/Time: 10/09/2019/5:58:14 PM    Final    DG HIP UNILAT WITH PELVIS 2-3 VIEWS RIGHT  Result Date: 10/18/2019 CLINICAL DATA:  Right hip pain for 5 days following fall, initial encounter EXAM: DG HIP (WITH OR WITHOUT PELVIS) 2-3V RIGHT COMPARISON:  None. 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 No acute fracture or dislocation is seen. No soft tissue abnormality is noted. IMPRESSION: Degenerative change without acute abnormality. Electronically Signed   By: Inez Catalina M.D.   On: 10/18/2019 12:21     CBC Recent Labs  Lab 10/20/19 2327 10/23/19 1924 10/24/19 1206 10/25/19 1159 10/26/19 0721  WBC 5.7 3.4* 3.7* 2.9* 3.0*  HGB 7.4* 6.5* 9.3* 8.8* 8.5*  HCT 23.0* 21.1* 29.1* 28.7* 28.4*  PLT 67* 76* 87* 84* 96*  MCV 93.9 95.9 90.9 92.3 93.4  MCH 30.2 29.5 29.1 28.3 28.0  MCHC 32.2 30.8 32.0 30.7 29.9*  RDW 17.3* 17.3* 19.0* 18.6* 17.9*  LYMPHSABS 0.4* 0.3*  --   --   --  MONOABS 0.2 0.2  --   --   --   EOSABS 0.0 0.0  --   --   --   BASOSABS 0.0 0.0  --   --   --     Chemistries  Recent Labs  Lab 10/21/19 0541 10/22/19 0434 10/24/19 1206 10/25/19 1159 10/26/19 0721  NA 130* 135  134* 133* 133* 134*  K 3.2* 4.1  4.1 3.7 3.8 3.8  CL 91* 99  99 94* 94* 96*  CO2 29 27  27 28 26 28   GLUCOSE 141* 124*  123* 162* 197* 124*  BUN 12 12  12 13 13 11   CREATININE 0.69 0.64  0.59* 0.85 0.75 0.66  CALCIUM 7.9* 8.1*  8.0* 7.7* 7.6* 7.7*  MG 1.5* 2.0 1.6* 1.6* 2.1  AST 19  --   --   --   --   ALT 21  --   --   --   --   ALKPHOS 51  --   --   --   --   BILITOT 0.8  --    --   --   --    ------------------------------------------------------------------------------------------------------------------ No results for input(s): CHOL, HDL, LDLCALC, TRIG, CHOLHDL, LDLDIRECT in the last 72 hours.  Lab Results  Component Value Date   HGBA1C 7.0 (H) 08/07/2019   ------------------------------------------------------------------------------------------------------------------ No results for input(s): TSH, T4TOTAL, T3FREE, THYROIDAB in the last 72 hours.  Invalid input(s): FREET3 ------------------------------------------------------------------------------------------------------------------ No results for input(s): VITAMINB12, FOLATE, FERRITIN, TIBC, IRON, RETICCTPCT in the last 72 hours.  Coagulation profile No results for input(s): INR, PROTIME in the last 168 hours. No results for input(s): DDIMER in the last 72 hours.  Cardiac Enzymes No results for input(s): CKMB, TROPONINI, MYOGLOBIN in the last 168 hours.  Invalid input(s): CK ------------------------------------------------------------------------------------------------------------------    Component Value Date/Time   BNP 104.0 (H) 08/06/2019 1211   Roxan Hockey M.D on 10/27/2019 at 8:30 PM  Go to [email or web address] - for contact info  [facility] - Office  [phone]

## 2019-10-28 ENCOUNTER — Inpatient Hospital Stay: Payer: Medicare Other

## 2019-10-28 LAB — CBC
HCT: 27 % — ABNORMAL LOW (ref 39.0–52.0)
Hemoglobin: 8.1 g/dL — ABNORMAL LOW (ref 13.0–17.0)
MCH: 28.4 pg (ref 26.0–34.0)
MCHC: 30 g/dL (ref 30.0–36.0)
MCV: 94.7 fL (ref 80.0–100.0)
Platelets: 117 10*3/uL — ABNORMAL LOW (ref 150–400)
RBC: 2.85 MIL/uL — ABNORMAL LOW (ref 4.22–5.81)
RDW: 17.3 % — ABNORMAL HIGH (ref 11.5–15.5)
WBC: 4 10*3/uL (ref 4.0–10.5)
nRBC: 0 % (ref 0.0–0.2)

## 2019-10-28 LAB — GLUCOSE, CAPILLARY
Glucose-Capillary: 100 mg/dL — ABNORMAL HIGH (ref 70–99)
Glucose-Capillary: 241 mg/dL — ABNORMAL HIGH (ref 70–99)
Glucose-Capillary: 132 mg/dL — ABNORMAL HIGH (ref 70–99)

## 2019-10-28 LAB — CULTURE, BLOOD (ROUTINE X 2)
Culture: NO GROWTH
Culture: NO GROWTH
Special Requests: ADEQUATE
Special Requests: ADEQUATE

## 2019-10-28 MED ORDER — METOPROLOL TARTRATE 25 MG PO TABS: 25.0000 mg | ORAL_TABLET | Freq: Two times a day (BID) | ORAL | 2 refills | Status: DC

## 2019-10-28 MED ORDER — SENNOSIDES-DOCUSATE SODIUM 8.6-50 MG PO TABS: 2.0000 | ORAL_TABLET | Freq: Every day | ORAL | 1 refills | Status: DC

## 2019-10-28 MED ORDER — AMOXICILLIN-POT CLAVULANATE 875-125 MG PO TABS: 1.0000 | ORAL_TABLET | Freq: Two times a day (BID) | ORAL | 0 refills | Status: AC

## 2019-10-28 MED ORDER — ACETAMINOPHEN 325 MG PO TABS: 650.0000 mg | ORAL_TABLET | Freq: Four times a day (QID) | ORAL | 0 refills | Status: AC | PRN

## 2019-10-28 MED ORDER — DILTIAZEM HCL ER COATED BEADS 360 MG PO CP24: 360.0000 mg | ORAL_CAPSULE | Freq: Every day | ORAL | 3 refills | Status: DC

## 2019-10-28 NOTE — Discharge Instructions (Addendum)
1) please follow-up with Dr. Lorna Few your oncologist 1 to 2 weeks for recheck and reevaluation 2) follow-up with your cardiologist Dr. [name] for further adjustments of the heart medication within the next couple of weeks 3) low-salt diet advised

## 2019-10-28 NOTE — Plan of Care (Signed)

## 2019-10-28 NOTE — Progress Notes (Addendum)
Physical Therapy Treatment Patient Details Name: Jeremy [name] MRN: [id number] DOB: 30-Jul-1945 Today's Date: 10/28/2019    History of Present Illness Jeremy [name] is a 74 y.o. male with medical history significant of right lung cancer, COPD, type 2 diabetes, hypertension, psoriatic arthritis  who was discharged from [facility] after being admitted from 10/15/2019 until 10/17/2019 due to generalized weakness and fall.  He is coming today to [facility] due to fever and frequency without hematuria.  He has also have mild confusion.  He denies headache, sore throat, rhinorrhea, hemoptysis, but has occasional wheezing and very frequent productive cough of whitish sputum since he was diagnosed with the lung mass.  He denies chest pain, palpitations, dizziness, diaphoresis, PND, orthopnea or pitting edema lower extremities.  No he had just undergone his third cycle of palliative chemotherapy on 10/14/2019.  He has a generalized macular papular rash due to [facility], which has been discontinued.    PT Comments    Pt with significant improvement. Pt able to ambulate further distance with SUPV this date, no unsteadiness or near falls. Pt initially requires min guard assist to rise from seated surface due to pulling on RW to rise, but with cues to push from seated surface and push through bil flat feet, pt able to rise with SUPV. Pt tolerates therapeutic exercises without increased work of breathing and good motor control. Pt on RA with SpO2 94% during session. Pt's spouse in room, answered all questions and discussed session while performing. Spouse upset because desired SNF doesn't have a bed and the pt will be coming home with her, but she feels like he is doing much better and isn't as nervous after observing session. Pt reports he was walking around the room earlier today with spouse and RW without difficulty. Updated d/c to HHPT due to improvements with therapy. Pt will benefit from continued  physical therapy in hospital and recommendations below to increase strength, balance, endurance for safe ADLs and gait.   Follow Up Recommendations  Home health PT;Supervision - Intermittent     Equipment Recommendations  Rolling walker with 5" wheels    Recommendations for Other Services       Precautions / Restrictions Precautions Precautions: Fall Restrictions Weight Bearing Restrictions: No    Mobility  Bed Mobility  General bed mobility comments: in recliner upon arrival  Transfers Overall transfer level: Needs assistance Equipment used: Rolling walker (2 wheeled) Transfers: Sit to/from Stand Sit to Stand: Min guard;Supervision  General transfer comment: initially attempts to pull on RW to rise, with cues to push from seated surface able to rise with SUPV  Ambulation/Gait Ambulation/Gait assistance: Supervision Gait Distance (Feet): 120 Feet Assistive device: Rolling walker (2 wheeled) Gait Pattern/deviations: Step-through pattern Gait velocity: slightly decreased  General Gait Details: on RA with moderate SOB and SpO2 94%, cues to clear past obstacles at safe distance, ambulates in hallway without physical assistance, no unsteadiness or loss of balance   Stairs        Wheelchair Mobility    Modified Rankin (Stroke Patients Only)       Balance Overall balance assessment: Needs assistance  Standing balance support: During functional activity;Bilateral upper extremity supported Standing balance-Leahy Scale: Fair Standing balance comment: with RW    Cognition Arousal/Alertness: Awake/alert Behavior During Therapy: WFL for tasks assessed/performed Overall Cognitive Status: Within Functional Limits for tasks assessed     Exercises General Exercises - Lower Extremity Long Arc Quad: Seated;Both;20 reps Hip Flexion/Marching: Seated;Both;15 reps Toe  Raises: Seated;Both;20 reps Heel Raises: Seated;Both;20 reps Mini-Sqauts: 10 reps    General Comments         Pertinent Vitals/Pain Pain Assessment: No/denies pain    Home Living         Prior Function            PT Goals (current goals can now be found in the care plan section) Acute Rehab PT Goals Patient Stated Goal: increase strength before going home PT Goal Formulation: With patient/family Time For Goal Achievement: 11/07/19 Potential to Achieve Goals: Good Progress towards PT goals: Progressing toward goals    Frequency    Min 3X/week      PT Plan Discharge plan needs to be updated    Co-evaluation              AM-PAC PT "6 Clicks" Mobility   Outcome Measure  Help needed turning from your back to your side while in a flat bed without using bedrails?: None Help needed moving from lying on your back to sitting on the side of a flat bed without using bedrails?: A Little Help needed moving to and from a bed to a chair (including a wheelchair)?: A Little Help needed standing up from a chair using your arms (e.g., wheelchair or bedside chair)?: A Little Help needed to walk in hospital room?: None Help needed climbing 3-5 steps with a railing? : A Little 6 Click Score: 20    End of Session Equipment Utilized During Treatment: Gait belt Activity Tolerance: Patient tolerated treatment well Patient left: in chair;with call bell/phone within reach;with family/visitor present Nurse Communication: Mobility status PT Visit Diagnosis: Unsteadiness on feet (R26.81);History of falling (Z91.81);Difficulty in walking, not elsewhere classified (R26.2)     Time: 5883-2549 PT Time Calculation (min) (ACUTE ONLY): 22 min  Charges:  $Gait Training: 8-22 mins $Therapeutic Exercise: 8-22 mins                      Tori Shakiah Wester PT, DPT 10/28/19, 2:33 PM [phone]

## 2019-10-28 NOTE — TOC Transition Note (Addendum)
Transition of Care [facility] - CM/SW Discharge Note   Patient Details  Name: Jeremy [name] MRN: [id number] Date of Birth: January 08, 1946  [facility] CM/SW Contact:  Ihor Gully, LCSW Phone Number: 10/28/2019, 6:04 PM   Clinical Narrative:    Mrs. Ginty chooses to discharge home with [facility] services. Patient referred to [facility] last week upon d/c from [facility]. Santiago Glad with [facility] messaged to notify of d/c. DME choices discussed. BSC and RW ordered through [employer] and delivered to room..  Final next level of care: [place] Barriers to Discharge: Continued Medical Work up   Patient Goals and CMS Choice        Discharge Placement                       Discharge Plan and Services In-house Referral: Clinical Social Work                                   Social Determinants of Health (SDOH) Interventions     Readmission Risk Interventions Readmission Risk Prevention Plan 10/20/2019  Transportation Screening Complete  HRI or [place] Complete  Social Work Consult for [place] Planning/Counseling Complete  Palliative Care Screening Not Applicable  Medication Review [employer]) Complete  Some recent data might be hidden

## 2019-10-28 NOTE — Discharge Summary (Signed)
Jeremy [name], is a 74 y.o. male  DOB [birth year]  MRN [id number].  Admission date:  10/19/2019  Admitting Physician  [name], MD  Discharge Date:  10/28/2019   Primary MD  [name], MD  Recommendations for primary care physician for things to follow:   1) please follow-up with Dr. Lorna Few your oncologist 1 to 2 weeks for recheck and reevaluation 2) follow-up with your cardiologist Dr. [name] for further adjustments of the heart medication within the next couple of weeks 3) low-salt diet advised  Admission Diagnosis  Sepsis ([place]) [A41.9] Sepsis due to undetermined organism [facility]) [A41.9] Malignant neoplasm of lung, unspecified laterality, unspecified part of lung ([place]) [C34.90] Sepsis without acute organ dysfunction, due to unspecified organism [facility]) [A41.9]   Discharge Diagnosis  Sepsis ([place]) [A41.9] Sepsis due to undetermined organism ([place]) [A41.9] Malignant neoplasm of lung, unspecified laterality, unspecified part of lung ([place]) [C34.90] Sepsis without acute organ dysfunction, due to unspecified organism [facility]) [A41.9]    Principal Problem:   Sepsis due to undetermined organism [facility]) Active Problems:   COPD (chronic obstructive pulmonary disease) ([place])   Type 2 diabetes mellitus ([place])   HTN (hypertension)   Anemia in neoplastic disease   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[place])   Thrombocytopenia ([place])      Past Medical History:  Diagnosis Date  . Cancer ([place])    mass right lung  . COPD (chronic obstructive pulmonary disease) (H[facility])    QUIT SMOKING 30 YRS AGO  . Diabetes ([place]) 2018  . HTN (hypertension)   . Psoriatic arthritis ([place]) 2015    Past Surgical History:  Procedure Laterality Date  . BACK SURGERY  1990  . BIOPSY  12/30/2018   Procedure: BIOPSY;  Surgeon: Danie Binder, MD;  [facility];  Service: Endoscopy;;  gastric  .  CATARACT EXTRACTION Bilateral   . CERVICAL SPINE SURGERY  1995  . COLONOSCOPY WITH PROPOFOL N/A 12/30/2018   Procedure: COLONOSCOPY WITH PROPOFOL;  Surgeon: Danie Binder, MD;  [facility];  Service: Endoscopy;  Laterality: N/A;  12:30pm  . ESOPHAGOGASTRODUODENOSCOPY (EGD) WITH PROPOFOL N/A 12/30/2018   Procedure: ESOPHAGOGASTRODUODENOSCOPY (EGD) WITH PROPOFOL;  Surgeon: Danie Binder, MD;  [facility];  Service: Endoscopy;  Laterality: N/A;  . IR IMAGING GUIDED PORT INSERTION  09/04/2019  . POLYPECTOMY  12/30/2018   Procedure: POLYPECTOMY;  Surgeon: Danie Binder, MD;  [facility];  Service: Endoscopy;;  colon  . THORACENTESIS Right 08/08/2019   Procedure: Thoracentesis;  Surgeon: [name], MD;  Location: [place];  Service: Pulmonary;  Laterality: Right;  Marland Kitchen VIDEO BRONCHOSCOPY Right 08/08/2019   Procedure: Video Bronchoscopy with Erbe Cryo Biopsy of Right mainstem;  Surgeon: [name], MD;  Location: [facility] OR;  Service: Pulmonary;  Laterality: Right;     HPI  from the history and physical done on the day of admission:    Chief Complaint: Fever and frequency.  HPI: Jeremy Minkin  [name] is a 74 y.o. male with medical history significant of right lung cancer, COPD, type 2 diabetes, hypertension, psoriatic arthritis  who was discharged from [facility] after being admitted from 10/15/2019 until 10/17/2019 due to generalized weakness and fall.  He is coming today to [facility] due to fever and frequency without hematuria.  He has also have mild confusion.  He denies headache, sore throat, rhinorrhea, hemoptysis, but has occasional wheezing and very frequent productive cough of whitish sputum since he was diagnosed with the lung mass.  He denies chest pain, palpitations, dizziness, diaphoresis, PND, orthopnea or pitting edema lower extremities.  No he had just undergone his third cycle of palliative chemotherapy on 10/14/2019.  He has a generalized  macular papular rash due to [facility], which has been discontinued.  ED Course: Initial vital signs were temperature 103.3 F, pulse 120, respirations 31, blood pressure 149/88 mmHg and O2 sat 96% on room air.  Blood cultures x2 were drawn.  The patient was given 2500 mL of LR bolus, cefepime, metronidazole and vancomycin.  Serum analysis showed a hazy appearance, glucosuria of 50 and proteinuria 30 mg/dL.  There was moderate hemoglobinuria.  The rest of the values are unremarkable.  CBC shows a white count of 4.7, hemoglobin 8.4 g/dL and platelets 99.  PT was 13.1, APTT 34 and INR 1.0.  Lactic acid was 1.4 and then 1.0 mmol/L.  Sodium 133, potassium 4.4, chloride 96 and CO2 28 mmol/L.  Renal function was normal.  Glucose 161 mg/dL and albumin 2.3 g/dL.  The rest of the values are within normal range when calcium is corrected to albumin.  Imaging: Chest radiograph shows trace in size of complex of right pleural effusion with associated passive atelectasis though some underlying consolidation is not excluded in the setting of fever.  Necrotic right lung mass, better visualized on cross-sectional view.  Please see images and full radiology report for further detail.  Review of Systems: As per HPI otherwise all other systems reviewed and are negative.     Hospital Course:   Brief Summary:- 74 y.o.malewith medical history significant ofright lung cancer, COPD, type 2 diabetes, hypertension, psoriatic arthritis, with status post palliative chemo 10/14/2019 admitted on 10/19/2019 with pneumonia concerns about sepsis -Found to have tachyarrhythmia --PT eval noted, patient very weak requires SNF rehab -Physical therapist initially recommended SNF rehab, patient and wife declined and requested discharge home with home health -PT reevaluated patient and okayed discharge home with home health as long as there is supervision  A/p  1)Sepsis of urinary source---   -No further high fevers  -Stopped  Unasyn and started Augmentin for Enterococcus and concerns about pneumonia -Treated with Bactrim for staph in urine -Urine culture from 10/19/2019 with Enterococcus and Staphylococcus hemolyticus -Blood cultures 5/ 20/21 and repeat blood cultures from 10/23/2019 NGTD - stopped vancomycin as MRSA negative Antimicrobials this admission: 5/16Cefepime >>5/19 5/16Vancomycin >> 5/19 5/16Metronidazole x 1 5/19 Unasyn >>10/26/19 5/19 TMP/SMX PO >> 10/27/19-augmentin  Microbiology results: 5/16 BCxX 2: NGTD 5/16 UCx: >100,000CFU/ml E. faecalis pan sensitive; >100,000CFU/ml Staph haemolyticus susceptible tocipro, clinda, tetracycline, vancomycin, TMP/SMX; resistant tooxacillin 5/16 COVID: negative 5/13 UCx: NG/final 5/18 MRSA PCR: negative  repeat blood cultures from 10/23/2019 NGTD -Discharge on doxycycline and Augmentin   2)DM2-recent A1c 7.0 reflecting fair diabetic control PTA -Continue Metformin    3)Stage IV squamous cell carcinoma of right lung - Necrotic right lung mass, last palliative chemo on 10/14/2019--- [name] who is patient's oncologist notified of admission  to the hospital -Keytruda stopped b/c of rash  4)Chronic Neoplastic Anemia and Thrombocytopenia  in the patient with underlying malignancy currently undergoing chemotherapy--- monitor closely and transfuse as clinically indicated -Platelets up to 76 from 67, no bleeding noted, hemoglobin down to 6.5 from 7.4--baseline Hgb usually above 8 -Transfused 2 units of PRBC on 10/23/2019 -Repeat CBC on 10/28/2019 with hemoglobin of 8.1 -- 5)HTN=-continue Cardizem and metoprolol  6)COPD----doxycycline and Augmentin as above #1, continue bronchodilators and mucolytics  7)Acute on chronic  Hypoxic Respiratory Failure--- PTA patient was on 2 L of oxygen at home, -- suspect due to #1 and #6 above as well as #3 -Oxygen requirement appears to be back to baseline at this time  8)PAFib/PAT/PSVT--CHA2DS2- VASc score    is = 3 (HTN, DM and age)   Which is  equal to = 3.2 % annual risk of stroke -Patient is not on anticoagulation due to chronic anemia requiring transfusion from time to time -Discussed with Dr. [name] from cardiology service, -Patient's EF is 60 to 65%,  -Rate control remains very very challenging  PTA patient was on Cardizem 30 mg twice daily  -Currently titrated up to Cardizem 360 daily  -Metoprolol added -He may need amiodarone if rate control remains challenging despite high-dose Cardizem and added metoprolol  9)HFpEF--patient with history of chronic diastolic dysfunction CHF with preserved EF over 60 to 65%, appears euvolemic at this time --Rate control as above in #8  11) hypokalemia and hypomagnesemia----replaced and normalized =- 12) generalized weakness/deconditioning/ambulatory dysfunction--- --PT eval noted, patient very weak requires SNF rehab -Physical therapist initially recommended SNF rehab, patient and wife declined and requested discharge home with home health -PT reevaluated patient and okayed discharge home with home health as long as there is supervision  13)Social/Ethics--overall prognosis is poor patient and wife would like to follow-up with the oncologist [name] to discuss further plans prior to possibly de-escalating   Disposition---PT eval noted, patient very weak requires SNF rehab -Physical therapist initially recommended SNF rehab, patient and wife declined and requested discharge home with home health -PT reevaluated patient and okayed discharge home with home health as long as there is supervision  -Patient From: home D/C Place: Patient declined SNF placement, discharged home with home health  Code Status : DNR  Family Communication:   (patient is alert, awake and coherent) -Discussed with patient's wife at bedside  Consults  :  na  Discharge Condition: Stable, but overall prognosis is poor  Follow UP  Follow-up Information     Erma Heritage, PA-C Follow up.   Specialties: Physician Assistant, Cardiology Why: [place] location - we have arranged a follow-up for you with one of our PAs, [place], on Friday November 14, 2019 at 3:00 PM (Arrive by 2:45 PM). Contact information: [place] [address] [phone]        Curt Bears, MD. Schedule an appointment as soon as possible for a visit in 2 week(s).   Specialty: Oncology Contact information: [address] [place] [address] [phone]            Diet and Activity recommendation:  As advised  Discharge Instructions    Discharge Instructions    Call MD for:  difficulty breathing, headache or visual disturbances   Complete by: As directed    Call MD for:  extreme fatigue   Complete by: As directed    Call MD for:  persistant dizziness or light-headedness   Complete by: As  directed    Call MD for:  persistant nausea and vomiting   Complete by: As directed    Call MD for:  severe uncontrolled pain   Complete by: As directed    Call MD for:  temperature >100.4   Complete by: As directed    Diet - low sodium heart healthy   Complete by: As directed    Diet Carb Modified   Complete by: As directed    Discharge instructions   Complete by: As directed    1) please follow-up with Dr. Lorna Few your oncologist 1 to 2 weeks for recheck and reevaluation 2) follow-up with your cardiologist Dr. [name] for further adjustments of the heart medication within the next couple of weeks 3) low-salt diet advised   Increase activity slowly   Complete by: As directed        Discharge Medications     Allergies as of 10/28/2019      Reactions   Chlorhexidine    Aleve [naproxen Sodium] Hives, Rash      Medication List    STOP taking these medications   diltiazem 30 MG tablet Commonly known as: Cardizem   docusate sodium 100 MG capsule Commonly known as: COLACE     TAKE these medications     acetaminophen 325 MG tablet Commonly known as: TYLENOL Take 2 tablets (650 mg total) by mouth every 6 (six) hours as needed for mild pain, fever or headache. What changed:   medication strength  how much to take  when to take this  reasons to take this   albuterol 108 (90 Base) MCG/ACT inhaler Commonly known as: VENTOLIN HFA Inhale 2 puffs into the lungs every 6 (six) hours as needed for wheezing or shortness of breath.   amoxicillin-clavulanate 875-125 MG tablet Commonly known as: AUGMENTIN Take 1 tablet by mouth 2 (two) times daily for 3 days.   diltiazem 360 MG 24 hr capsule Commonly known as: CARDIZEM CD Take 1 capsule (360 mg total) by mouth daily. Start taking on: Oct 29, 2019   doxycycline 50 MG capsule Commonly known as: MONODOX Take 50 mg by mouth 2 (two) times daily as needed (rosacea flare ups.).   ferrous sulfate 325 (65 FE) MG tablet Take 325 mg by mouth daily with supper.   Fish Oil 1000 MG Caps Take 1,000 mg by mouth daily.   Flovent HFA 110 MCG/ACT inhaler Generic drug: fluticasone 1 puff 2 (two) times daily.   folic acid 1 MG tablet Commonly known as: FOLVITE Take 1 mg by mouth daily.   gabapentin 300 MG capsule Commonly known as: NEURONTIN Take 600 mg by mouth at bedtime.   lidocaine-prilocaine cream Commonly known as: EMLA Apply to the Port-A-Cath site 30 minutes before treatment   loratadine 10 MG tablet Commonly known as: CLARITIN Take 10 mg by mouth daily.   metFORMIN 500 MG 24 hr tablet Commonly known as: GLUCOPHAGE-XR Take 500-1,000 mg by mouth See admin instructions. Take 2 tablets (1000 mg) by mouth in the morning & 1 tablet (500 mg) by mouth at night.   metoprolol tartrate 25 MG tablet Commonly known as: LOPRESSOR Take 1 tablet (25 mg total) by mouth 2 (two) times daily.   metroNIDAZOLE 0.75 % cream Commonly known as: METROCREAM Apply 1 application topically 2 (two) times daily as needed (facial rosacea).   mometasone  0.1 % cream Commonly known as: ELOCON Apply 1 application topically 2 (two) times daily as needed (psoriasis).   pantoprazole 40 MG  tablet Commonly known as: Protonix 1 po 30 mins prior to first meal   predniSONE 20 MG tablet Commonly known as: Deltasone Please take 4 tablets (80 mg) daily for one week, followed by 3 tablets (60 mg) daily for 1 week, followed by 2 tablets (20 mg) for one week   prochlorperazine 10 MG tablet Commonly known as: COMPAZINE Take 1 tablet (10 mg total) by mouth every 6 (six) hours as needed for nausea or vomiting.   senna-docusate 8.6-50 MG tablet Commonly known as: Senokot-S Take 2 tablets by mouth at bedtime.   traMADol 50 MG tablet Commonly known as: ULTRAM Take 50 mg by mouth every 4 (four) hours as needed. for pain   triamcinolone lotion 0.1 % Commonly known as: KENALOG Apply 1 application topically 4 (four) times daily.   umeclidinium-vilanterol 62.5-25 MCG/INH Aepb Commonly known as: ANORO ELLIPTA Inhale 1 puff into the lungs daily.   vitamin C 1000 MG tablet Take 1,000 mg by mouth daily.            Durable Medical Equipment  (From admission, onward)         Start     Ordered   10/28/19 1739  For home use only DME Walker rolling  Once    Question Answer Comment  Walker: With Amelia Wheels   Patient needs a walker to treat with the following condition Dyspnea and respiratory abnormalities      10/28/19 1739   10/26/19 1210  For home use only DME Bedside commode  Once    Question:  Patient needs a bedside commode to treat with the following condition  Answer:  Weakness   10/26/19 1209          Major procedures and Radiology Reports - PLEASE review detailed and final reports for all details, in brief -    DG Chest 2 View  Result Date: 10/16/2019 CLINICAL DATA:  Lung cancer and weakness EXAM: CHEST - 2 VIEW COMPARISON:  08/06/2019 CT.  PET-CT 09/08/2019 FINDINGS: Right lower chest opacification with volume loss from  necrotic mass by comparison CT. Small to moderate right lateral pleural effusion has increased from prior. Left chest is clear. Stable heart size. Right port with tip at the SVC. IMPRESSION: Necrotic right lung mass as seen on recent staging scan. An overlying right pleural effusion has increased from April. Electronically Signed   By: Monte Fantasia M.D.   On: 10/16/2019 04:43   CT HEAD WO CONTRAST  Result Date: 10/16/2019 CLINICAL DATA:  Mental status change, unknown cause. Additional provided: Weakness, history of lung cancer EXAM: CT HEAD WITHOUT CONTRAST TECHNIQUE: Contiguous axial images were obtained from the base of the skull through the vertex without intravenous contrast. COMPARISON:  Brain MRI 08/10/2019 FINDINGS: Brain: Please note there is limited assessment for intracranial metastatic disease on this noncontrast CT study. Mild ill-defined hypoattenuation within the cerebral white matter is nonspecific, but consistent with chronic small vessel ischemic disease. Stable, mild generalized parenchymal atrophy. There is no acute intracranial hemorrhage. No demarcated cortical infarct. No extra-axial fluid collection. No evidence of intracranial mass. No midline shift. Vascular: No hyperdense vessel.  Atherosclerotic calcifications Skull: Normal. Negative for fracture or focal lesion. Sinuses/Orbits: Visualized orbits show no acute finding. Paranasal sinus mucosal thickening, most notable within bilateral ethmoid air cells. No significant mastoid effusion. IMPRESSION: 1. Please note there is limited assessment for intracranial metastatic disease on this non-contrast CT study. 2. No evidence of acute intracranial abnormality. 3. Mild generalized parenchymal atrophy  and chronic small vessel ischemic disease. 4. Paranasal sinus mucosal thickening, most notably ethmoid. Electronically Signed   By: Kellie Simmering DO   On: 10/16/2019 09:52   DG Chest Port 1 View  Result Date: 10/19/2019 CLINICAL DATA:  Fever  and urinary complaints EXAM: PORTABLE CHEST 1 VIEW COMPARISON:  Radiograph 10/16/2019 FINDINGS: Likely increasing size of the complex right pleural effusion with associated passive atelectasis though some underlying consolidation is not excluded. A necrotic lung mass is again seen in the right middle lobe, better visualized on cross-sectional imaging. Left lung is predominantly clear aside from basilar atelectasis. Left apical scarring as well. Cardiomediastinal contours are unchanged from prior, partially obscured in the right lung base by overlying opacity. Right IJ Port-A-Cath tip terminates at the superior cavoatrial junction. Telemetry leads overlie the chest. Degenerative changes are present in the imaged spine and shoulders. IMPRESSION: 1. Likely increasing size of the complex right pleural effusion with associated passive atelectasis though some underlying consolidation is not excluded in the setting of fever. 2. Necrotic right lung mass, better visualized on cross-sectional imaging. Electronically Signed   By: Lovena Le M.D.   On: 10/19/2019 17:39   ECHOCARDIOGRAM COMPLETE  Result Date: 10/09/2019    ECHOCARDIOGRAM REPORT   Patient Name:   Jeremy [name] Date of Exam: 10/09/2019 Medical Rec #:  [id number]     Height:       74.0 in Accession #:    [id number]    Weight:       179.6 lb Date of Birth:  19-Jan-1946     BSA:          2.076 m Patient Age:    86 years      BP:           117/71 mmHg Patient Gender: M             HR:           94 bpm. Exam Location:  [place] Procedure: 2D Echo, Cardiac Doppler and Color Doppler Indications:     I48.0 PAF  History:         Patient has no prior history of Echocardiogram examinations.                  COPD and Stage IV lung cancer. Anemia, Arrythmias:Atrial                  Fibrillation; Risk Factors:Hypertension, Diabetes and Former                  Smoker.  Sonographer:     Jeneen Montgomery RDMS, RVT, RDCS Referring Phys:  [id number] Alphonse Guild BRANCH Diagnosing Phys:  Kate Sable MD  Sonographer Comments: Technically difficult study due to poor echo windows. Image acquisition challenging due to COPD and Image acquisition challenging due to patient body habitus. IMPRESSIONS  1. Left ventricular ejection fraction, by estimation, is 60 to 65%. The left ventricle has normal function. Left ventricular endocardial border not optimally defined to evaluate regional wall motion. Left ventricular diastolic parameters are consistent with Grade I diastolic dysfunction (impaired relaxation).  2. Right ventricular systolic function is normal. The right ventricular size is moderately enlarged.  3. Right atrial size was mild to moderately dilated.  4. The mitral valve was not well visualized. Trivial mitral valve regurgitation.  5. Tricuspid valve regurgitation unable to assess.  6. The aortic valve was not well visualized. Aortic valve regurgitation is mild.  7. Pulmonic valve regurgitation unable to  assess.  8. Aortic dilatation noted. There is mild dilatation of the aortic root. Conclusion(s)/Recommendation(s): Suboptimal study. Consider contrast enhancement for future studies. FINDINGS  Left Ventricle: Left ventricular ejection fraction, by estimation, is 60 to 65%. The left ventricle has normal function. Left ventricular endocardial border not optimally defined to evaluate regional wall motion. The left ventricular internal cavity size was normal in size. There is no left ventricular hypertrophy. Left ventricular diastolic parameters are consistent with Grade I diastolic dysfunction (impaired relaxation). Normal left ventricular filling pressure. Right Ventricle: The right ventricular size is moderately enlarged. Right vetricular wall thickness was not assessed. Right ventricular systolic function is normal. Left Atrium: Left atrial size was normal in size. Right Atrium: Right atrial size was mild to moderately dilated. Pericardium: There is no evidence of pericardial effusion. Mitral  Valve: The mitral valve was not well visualized. Trivial mitral valve regurgitation. Tricuspid Valve: The tricuspid valve is not well visualized. Tricuspid valve regurgitation unable to assess. Aortic Valve: The aortic valve was not well visualized. Aortic valve regurgitation is mild. Pulmonic Valve: The pulmonic valve was not well visualized. Pulmonic valve regurgitation unable to assess. Aorta: Aortic dilatation noted. There is mild dilatation of the aortic root. Venous: The inferior vena cava was not well visualized. IAS/Shunts: The interatrial septum was not well visualized.  LEFT VENTRICLE PLAX 2D LVIDd:         3.88 cm      Diastology LVIDs:         2.68 cm      LV e' lateral:   5.62 cm/s LV PW:         0.96 cm      LV E/e' lateral: 11.1 LV IVS:        1.21 cm      LV e' medial:    7.86 cm/s LVOT diam:     2.00 cm      LV E/e' medial:  8.0 LV SV:         57 LV SV Index:   27 LVOT Area:     3.14 cm  LV Volumes (MOD) LV vol d, MOD A2C: 68.9 ml LV vol d, MOD A4C: 118.0 ml LV vol s, MOD A2C: 37.0 ml LV vol s, MOD A4C: 47.6 ml LV SV MOD A2C:     31.9 ml LV SV MOD A4C:     118.0 ml LV SV MOD BP:      54.6 ml RIGHT VENTRICLE RV S prime:     14.50 cm/s TAPSE (M-mode): 2.8 cm LEFT ATRIUM             Index LA diam:        2.70 cm 1.30 cm/m LA Vol (A2C):   56.7 ml 27.31 ml/m LA Vol (A4C):   40.7 ml 19.60 ml/m LA Biplane Vol:         23.90 ml/m  AORTIC VALVE LVOT Vmax:   103.00 cm/s LVOT Vmean:  64.000 cm/s LVOT VTI:    0.181 m  AORTA Ao Root diam: 3.80 cm MITRAL VALVE               TRICUSPID VALVE MV Area (PHT):             TR Peak grad:   3.0 mmHg MV Decel Time: 143 msec    TR Vmax:        86.30 cm/s MR Peak grad: 21.8 mmHg MR Vmax:      233.50 cm/s  SHUNTS MV E  velocity: 62.60 cm/s  Systemic VTI:  0.18 m MV A velocity: 70.80 cm/s  Systemic Diam: 2.00 cm MV E/A ratio:  0.88 Kate Sable MD Electronically signed by Kate Sable MD Signature Date/Time: 10/09/2019/5:58:14 PM    Final    DG HIP UNILAT WITH  PELVIS 2-3 VIEWS RIGHT  Result Date: 10/18/2019 CLINICAL DATA:  Right hip pain for 5 days following fall, initial encounter EXAM: DG HIP (WITH OR WITHOUT PELVIS) 2-3V RIGHT COMPARISON:  None. 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 No acute fracture or dislocation is seen. No soft tissue abnormality is noted. IMPRESSION: Degenerative change without acute abnormality. Electronically Signed   By: Inez Catalina M.D.   On: 10/18/2019 12:21    Micro Results    Recent Results (from the past 240 hour(s))  Culture, blood (Routine x 2)     Status: None   Collection Time: 10/19/19  5:25 PM   Specimen: BLOOD LEFT ARM  Result Value Ref Range Status   Specimen Description BLOOD LEFT ARM  Final   Special Requests   Final    BOTTLES DRAWN AEROBIC AND ANAEROBIC Blood Culture adequate volume   Culture   Final    NO GROWTH 5 DAYS Performed at [facility] [facility], [address], [place], [place] [address]    Report Status 10/24/2019 FINAL  Final  Urine culture     Status: Abnormal   Collection Time: 10/19/19  5:41 PM   Specimen: In/Out Cath Urine  Result Value Ref Range Status   Specimen Description   Final    IN/OUT CATH URINE Performed at [facility], [address], [place], [place] [address]    Special Requests   Final    NONE Performed at [facility], [address] [place], [place] [address]    Culture (A)  Final    >=100,000 COLONIES/mL ENTEROCOCCUS FAECALIS >=100,000 COLONIES/mL STAPHYLOCOCCUS HAEMOLYTICUS    Report Status 10/22/2019 FINAL  Final   Organism ID, Bacteria ENTEROCOCCUS FAECALIS (A)  Final   Organism ID, Bacteria STAPHYLOCOCCUS HAEMOLYTICUS (A)  Final      Susceptibility   Enterococcus faecalis - MIC*    AMPICILLIN <=2 SENSITIVE Sensitive     NITROFURANTOIN <=16 SENSITIVE Sensitive     VANCOMYCIN 2 SENSITIVE Sensitive     * >=100,000 COLONIES/mL ENTEROCOCCUS FAECALIS   Staphylococcus haemolyticus - MIC*    CIPROFLOXACIN <=0.5 SENSITIVE  Sensitive     GENTAMICIN <=0.5 SENSITIVE Sensitive     NITROFURANTOIN <=16 SENSITIVE Sensitive     OXACILLIN >=4 RESISTANT Resistant     TETRACYCLINE <=1 SENSITIVE Sensitive     VANCOMYCIN 1 SENSITIVE Sensitive     TRIMETH/SULFA <=10 SENSITIVE Sensitive     CLINDAMYCIN <=0.25 SENSITIVE Sensitive     RIFAMPIN <=0.5 SENSITIVE Sensitive     Inducible Clindamycin NEGATIVE Sensitive     * >=100,000 COLONIES/mL STAPHYLOCOCCUS HAEMOLYTICUS  Culture, blood (Routine x 2)     Status: None   Collection Time: 10/19/19  5:50 PM   Specimen: BLOOD LEFT WRIST  Result Value Ref Range Status   Specimen Description BLOOD LEFT WRIST  Final   Special Requests   Final    BOTTLES DRAWN AEROBIC AND ANAEROBIC Blood Culture adequate volume   Culture   Final    NO GROWTH 5 DAYS Performed at [facility], [address], [place], [place] [address]    Report Status 10/24/2019 FINAL  Final  SARS Coronavirus 2 by RT PCR (hospital order, performed in [facility] hospital lab) Nasopharyngeal Nasopharyngeal Swab     Status: None   Collection Time: 10/19/19  8:17 PM   Specimen: Nasopharyngeal Swab  Result Value Ref Range Status   SARS Coronavirus 2 NEGATIVE NEGATIVE Final    Comment: (NOTE) SARS-CoV-2 target nucleic acids are NOT DETECTED. The SARS-CoV-2 RNA is generally detectable in upper and lower respiratory specimens during the acute phase of infection. The lowest concentration of SARS-CoV-2 viral copies this assay can detect is 250 copies / mL. A negative result does not preclude SARS-CoV-2 infection and should not be used as the sole basis for treatment or other patient management decisions.  A negative result may occur with improper specimen collection / handling, submission of specimen other than nasopharyngeal swab, presence of viral mutation(s) within the areas targeted by this assay, and inadequate number of viral copies (<250 copies / mL). A negative result must be combined with  clinical observations, patient history, and epidemiological information. Fact Sheet for Patients:   StrictlyIdeas.no Fact Sheet for Healthcare Providers: BankingDealers.co.za This test is not yet approved or cleared  by the [place] FDA and has been authorized for detection and/or diagnosis of SARS-CoV-2 by FDA under an Emergency Use Authorization (EUA).  This EUA will remain in effect (meaning this test can be used) for the duration of the COVID-19 declaration under Section 564(b)(1) of the Act, 21 U.S.C. section 360bbb-3(b)(1), unless the authorization is terminated or revoked sooner. Performed at [facility], [address] [place], [place] [address]   MRSA PCR Screening     Status: None   Collection Time: 10/21/19  3:16 PM   Specimen: Nasal Mucosa; Nasopharyngeal  Result Value Ref Range Status   MRSA by PCR NEGATIVE NEGATIVE Final    Comment:        The GeneXpert MRSA Assay (FDA approved for NASAL specimens only), is one component of a comprehensive MRSA colonization surveillance program. It is not intended to diagnose MRSA infection nor to guide or monitor treatment for MRSA infections. Performed at [facility], [address], [place], [place] [address]   Culture, blood (Routine X 2) w Reflex to ID Panel     Status: None   Collection Time: 10/23/19  8:16 PM   Specimen: BLOOD LEFT HAND  Result Value Ref Range Status   Specimen Description BLOOD LEFT HAND  Final   Special Requests   Final    Blood Culture adequate volume BOTTLES DRAWN AEROBIC ONLY   Culture   Final    NO GROWTH 5 DAYS Performed at [facility], [address], [place], [place] [address]    Report Status 10/28/2019 FINAL  Final  Culture, blood (Routine X 2) w Reflex to ID Panel     Status: None   Collection Time: 10/23/19  8:17 PM   Specimen: BLOOD LEFT WRIST  Result Value Ref Range Status   Specimen Description BLOOD LEFT WRIST  Final   Special  Requests   Final    BOTTLES DRAWN AEROBIC AND ANAEROBIC Blood Culture adequate volume   Culture   Final    NO GROWTH 5 DAYS Performed at [facility], [address], [place], [place] [address]    Report Status 10/28/2019 FINAL  Final       Today   Subjective    Jeremy [name] today has no new complaints, -Wife at bedside -Patient states he feels better, patient states he is ambulating better, requesting discharge home  ---PT eval noted, patient very weak requires SNF rehab -  Physical therapist initially recommended SNF rehab, patient and wife declined and requested discharge home with home health -PT reevaluated patient and okayed discharge home with home health as long as there is supervision          Patient has been seen and examined prior to discharge   Objective   Blood pressure 127/75, pulse 94, temperature 97.6 F (36.4 C), temperature source Oral, resp. rate 16, height 6\' 2"  (1.88 m), weight 81.6 kg, SpO2 97 %.   Intake/Output Summary (Last 24 hours) at 10/28/2019 1753 Last data filed at 10/27/2019 2100 Gross per 24 hour  Intake 240 ml  Output 400 ml  Net -160 ml    Exam Gen:- Awake Alert,  In no apparent distress  HEENT:- [place].AT, No sclera icterus Nose- [place] 2L/min Neck-Supple Neck,No JVD,.  Lungs-improving air movement, no wheezing  CV- S1, S2 normal,irregular , rate control much better, Port-A-Cath site is clean dry and intact Abd-  +ve B.Sounds, Abd Soft, No tenderness,    Extremity/Skin:- No significant edema, pedal pulses present Psych-affect is appropriate, oriented x3 Neuro-generalized weakness and deconditioning, no new focal deficits, no tremors   Data Review   CBC w Diff:  Lab Results  Component Value Date   WBC 4.0 10/28/2019   HGB 8.1 (L) 10/28/2019   HGB 8.5 (L) 10/14/2019   HCT 27.0 (L) 10/28/2019   PLT 117 (L) 10/28/2019   PLT 197 10/14/2019   LYMPHOPCT 10 10/23/2019   MONOPCT 6 10/23/2019   EOSPCT 1 10/23/2019   BASOPCT 0 10/23/2019     CMP:  Lab Results  Component Value Date   NA 134 (L) 10/26/2019   K 3.8 10/26/2019   CL 96 (L) 10/26/2019   CO2 28 10/26/2019   BUN 11 10/26/2019   CREATININE 0.66 10/26/2019   CREATININE 0.86 10/14/2019   PROT 6.3 (L) 10/21/2019   ALBUMIN 2.3 (L) 10/26/2019   BILITOT 0.8 10/21/2019   BILITOT 0.3 10/14/2019   ALKPHOS 51 10/21/2019   AST 19 10/21/2019   AST 9 (L) 10/14/2019   ALT 21 10/21/2019   ALT 17 10/14/2019  .   Total Discharge time is about 33 minutes  Roxan Hockey M.D on 10/28/2019 at 5:53 PM  Go to [email or web address] -  for contact info  [facility] - Office  [phone]

## 2019-10-29 ENCOUNTER — Telehealth: Payer: Self-pay | Admitting: Medical Oncology

## 2019-10-29 NOTE — Telephone Encounter (Signed)
Next appt 11/05/19- . Jeremy [name] said he is not up to getting Ct on Friday . I moved the CT  expected date to next Tuesday.

## 2019-10-29 NOTE — Progress Notes (Signed)
Pharmacist Chemotherapy Monitoring - Follow Up Assessment    I verify that I have reviewed each item in the below checklist:  . Regimen for the patient is scheduled for the appropriate day and plan matches scheduled date. Marland Kitchen Appropriate non-routine labs are ordered dependent on drug ordered. . If applicable, additional medications reviewed and ordered per protocol based on lifetime cumulative doses and/or treatment regimen.   Plan for follow-up and/or issues identified: No . I-vent associated with next due treatment: No . MD and/or nursing notified: No  Kajsa Butrum D 10/29/2019 2:50 PM

## 2019-10-31 ENCOUNTER — Ambulatory Visit (HOSPITAL_COMMUNITY): Payer: Medicare Other

## 2019-11-05 ENCOUNTER — Inpatient Hospital Stay: Payer: Medicare Other | Admitting: Nutrition

## 2019-11-05 ENCOUNTER — Inpatient Hospital Stay: Payer: Medicare Other

## 2019-11-05 ENCOUNTER — Other Ambulatory Visit: Payer: Self-pay

## 2019-11-05 ENCOUNTER — Encounter: Payer: Self-pay | Admitting: Physician Assistant

## 2019-11-05 ENCOUNTER — Inpatient Hospital Stay: Payer: Medicare Other | Attending: Internal Medicine | Admitting: Physician Assistant

## 2019-11-05 ENCOUNTER — Other Ambulatory Visit: Payer: Self-pay | Admitting: *Deleted

## 2019-11-05 VITALS — BP 134/75 | HR 95 | Temp 98.1°F | Resp 24

## 2019-11-05 DIAGNOSIS — D649 Anemia, unspecified: Secondary | ICD-10-CM

## 2019-11-05 DIAGNOSIS — T451X5A Adverse effect of antineoplastic and immunosuppressive drugs, initial encounter: Secondary | ICD-10-CM | POA: Insufficient documentation

## 2019-11-05 DIAGNOSIS — I10 Essential (primary) hypertension: Secondary | ICD-10-CM | POA: Insufficient documentation

## 2019-11-05 DIAGNOSIS — Z79899 Other long term (current) drug therapy: Secondary | ICD-10-CM | POA: Insufficient documentation

## 2019-11-05 DIAGNOSIS — J90 Pleural effusion, not elsewhere classified: Secondary | ICD-10-CM

## 2019-11-05 DIAGNOSIS — C3431 Malignant neoplasm of lower lobe, right bronchus or lung: Secondary | ICD-10-CM | POA: Insufficient documentation

## 2019-11-05 DIAGNOSIS — R5382 Chronic fatigue, unspecified: Secondary | ICD-10-CM

## 2019-11-05 DIAGNOSIS — J910 Malignant pleural effusion: Secondary | ICD-10-CM | POA: Insufficient documentation

## 2019-11-05 DIAGNOSIS — Z923 Personal history of irradiation: Secondary | ICD-10-CM | POA: Insufficient documentation

## 2019-11-05 DIAGNOSIS — Z87891 Personal history of nicotine dependence: Secondary | ICD-10-CM | POA: Insufficient documentation

## 2019-11-05 DIAGNOSIS — Z9221 Personal history of antineoplastic chemotherapy: Secondary | ICD-10-CM | POA: Insufficient documentation

## 2019-11-05 DIAGNOSIS — E119 Type 2 diabetes mellitus without complications: Secondary | ICD-10-CM | POA: Insufficient documentation

## 2019-11-05 DIAGNOSIS — J449 Chronic obstructive pulmonary disease, unspecified: Secondary | ICD-10-CM | POA: Insufficient documentation

## 2019-11-05 DIAGNOSIS — C3491 Malignant neoplasm of unspecified part of right bronchus or lung: Secondary | ICD-10-CM

## 2019-11-05 DIAGNOSIS — L4050 Arthropathic psoriasis, unspecified: Secondary | ICD-10-CM | POA: Insufficient documentation

## 2019-11-05 DIAGNOSIS — D6481 Anemia due to antineoplastic chemotherapy: Secondary | ICD-10-CM | POA: Insufficient documentation

## 2019-11-05 DIAGNOSIS — Z792 Long term (current) use of antibiotics: Secondary | ICD-10-CM | POA: Insufficient documentation

## 2019-11-05 DIAGNOSIS — R21 Rash and other nonspecific skin eruption: Secondary | ICD-10-CM | POA: Insufficient documentation

## 2019-11-05 DIAGNOSIS — Z95828 Presence of other vascular implants and grafts: Secondary | ICD-10-CM

## 2019-11-05 DIAGNOSIS — Z452 Encounter for adjustment and management of vascular access device: Secondary | ICD-10-CM | POA: Insufficient documentation

## 2019-11-05 DIAGNOSIS — R531 Weakness: Secondary | ICD-10-CM | POA: Diagnosis not present

## 2019-11-05 DIAGNOSIS — J869 Pyothorax without fistula: Secondary | ICD-10-CM | POA: Diagnosis not present

## 2019-11-05 LAB — PREPARE RBC (CROSSMATCH)

## 2019-11-05 LAB — CMP (CANCER CENTER ONLY)
ALT: 20 U/L (ref 0–44)
AST: 17 U/L (ref 15–41)
Albumin: 1.7 g/dL — ABNORMAL LOW (ref 3.5–5.0)
Alkaline Phosphatase: 67 U/L (ref 38–126)
Anion gap: 12 (ref 5–15)
BUN: 14 mg/dL (ref 8–23)
CO2: 28 mmol/L (ref 22–32)
Calcium: 9 mg/dL (ref 8.9–10.3)
Chloride: 100 mmol/L (ref 98–111)
Creatinine: 0.76 mg/dL (ref 0.61–1.24)
GFR, Est AFR Am: >60 mL/min (ref >60)
GFR, Estimated: >60 mL/min (ref >60)
Glucose, Bld: 154 mg/dL — ABNORMAL HIGH (ref 70–99)
Potassium: 4.3 mmol/L (ref 3.5–5.1)
Sodium: 140 mmol/L (ref 135–145)
Total Bilirubin: 0.4 mg/dL (ref 0.3–1.2)
Total Protein: 8.1 g/dL (ref 6.5–8.1)

## 2019-11-05 LAB — CBC WITH DIFFERENTIAL (CANCER CENTER ONLY)
Abs Immature Granulocytes: 0.21 10*3/uL — ABNORMAL HIGH (ref 0.00–0.07)
Basophils Absolute: 0 10*3/uL (ref 0.0–0.1)
Basophils Relative: 0 %
Eosinophils Absolute: 0 10*3/uL (ref 0.0–0.5)
Eosinophils Relative: 0 %
HCT: 25.9 % — ABNORMAL LOW (ref 39.0–52.0)
Hemoglobin: 7.8 g/dL — ABNORMAL LOW (ref 13.0–17.0)
Immature Granulocytes: 2 %
Lymphocytes Relative: 8 %
Lymphs Abs: 0.7 10*3/uL (ref 0.7–4.0)
MCH: 28.4 pg (ref 26.0–34.0)
MCHC: 30.1 g/dL (ref 30.0–36.0)
MCV: 94.2 fL (ref 80.0–100.0)
Monocytes Absolute: 0.7 10*3/uL (ref 0.1–1.0)
Monocytes Relative: 8 %
Neutro Abs: 7.1 10*3/uL (ref 1.7–7.7)
Neutrophils Relative %: 82 %
Platelet Count: 288 10*3/uL (ref 150–400)
RBC: 2.75 MIL/uL — ABNORMAL LOW (ref 4.22–5.81)
RDW: 17.2 % — ABNORMAL HIGH (ref 11.5–15.5)
WBC Count: 8.8 10*3/uL (ref 4.0–10.5)
nRBC: 0 % (ref 0.0–0.2)

## 2019-11-05 LAB — TSH: TSH: 1.118 u[IU]/mL (ref 0.350–4.500)

## 2019-11-05 MED ORDER — SODIUM CHLORIDE 0.9% IV SOLUTION
250.0000 mL | Freq: Once | INTRAVENOUS | Status: AC
  Administered 2019-11-05: 250 mL via INTRAVENOUS
  Filled 2019-11-05: qty 250

## 2019-11-05 MED ORDER — ACETAMINOPHEN 325 MG PO TABS
ORAL_TABLET | ORAL | Status: AC
  Filled 2019-11-05: qty 2

## 2019-11-05 MED ORDER — ACETAMINOPHEN 325 MG PO TABS
650.0000 mg | ORAL_TABLET | Freq: Once | ORAL | Status: AC
  Administered 2019-11-05: 650 mg via ORAL

## 2019-11-05 MED ORDER — SODIUM CHLORIDE 0.9% FLUSH
10.0000 mL | INTRAVENOUS | Status: DC | PRN
  Filled 2019-11-05: qty 10

## 2019-11-05 MED ORDER — DIPHENHYDRAMINE HCL 25 MG PO CAPS
ORAL_CAPSULE | ORAL | Status: AC
  Filled 2019-11-05: qty 2

## 2019-11-05 MED ORDER — DIPHENHYDRAMINE HCL 25 MG PO CAPS
25.0000 mg | ORAL_CAPSULE | Freq: Once | ORAL | Status: AC
  Administered 2019-11-05: 25 mg via ORAL

## 2019-11-05 MED ORDER — SODIUM CHLORIDE 0.9% FLUSH
10.0000 mL | INTRAVENOUS | Status: AC | PRN
  Administered 2019-11-05: 10 mL
  Filled 2019-11-05: qty 10

## 2019-11-05 MED ORDER — HEPARIN SOD (PORK) LOCK FLUSH 100 UNIT/ML IV SOLN
250.0000 [IU] | INTRAVENOUS | Status: AC | PRN
  Administered 2019-11-05: 250 [IU]
  Filled 2019-11-05: qty 5

## 2019-11-05 NOTE — Patient Instructions (Signed)

## 2019-11-05 NOTE — Progress Notes (Signed)
WITNESSED Fall at [facility] by wife in visitor bathroom.    Wife called out into the hall for assistance from tech walking down hall.  Further assistance was found from another tech and nurse.  Patient found down on knees with brief down post using the bathroom.  Wife standing beside patient.  He was alert and oriented and stated that his knees buckled and he had an assisted fall to the floor.  Denies any injury to body, other than extreme lethargy post hospitalization and chemo.    Patient required 3 person assist to upright position and into wheelchair where vital signs were taken and were within normal limits.  Providers were notified of fall.  Patient was scheduled to be seen today by Dr. Julien Nordmann and Rubin Payor.

## 2019-11-05 NOTE — Progress Notes (Signed)
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  PDL1 expression 20%.  PRIOR THERAPY: Palliative radiotherapy under the care of Dr. Sondra Come to the obstructive right lower lobe lung mass.  CURRENT THERAPY: Systemic chemotherapy with carboplatin for AUC of 5, paclitaxel 175 mg/M2 and Keytruda 200 mg IV every 3 weeks with Neulasta support. First dose September 02, 2019.Status post 3 cycles.  Keytruda held starting from cycle #2 due to skin rash.  INTERVAL HISTORY: Jeremy [name] 74 y.o. male returns to the clinic today for a follow-up visit accompanied by his wife. The patient was hospitalized in the interval since his last treatment.  The patient was in the hospital from 10/16/2019 to 10/18/2019 for the chief complaint of weakness.  The patient was then re-hospitalized from 10/19/2019 to 10/28/2019 after presenting to the emergency room for code sepsis.  The patient was admitted and treated for antibiotics for a UTI.  The patient also received 2 units of RBCs for chemotherapy-induced anemia while admitted as well.  Today, the patient still is very weak and deconditioned.  While in the cancer center today, the patient was in the bathroom and his knees buckled.  The patient's wife witnessed the event and helped by the patient to the floor.  Home health and rehab are coming to the patient's home tomorrow for evaluation.  The patient typically ambulates with a walker.  The patient does not feel up to receiving treatment today, which is reasonable, given his condition.  He denies any recent fevers, chills, or night sweats.  He was not able to stand up on the scale today to obtain a weight but reports that his appetite is poor.  The patient does not notice an appreciable change with his breathing.  He states he he still  has his baseline cough which is productive.  He occasionally uses oxygen via nasal cannula.  Patient also reports his baseline shortness of breath he denies any chest pain or hemoptysis.  He denies any nausea, vomiting, diarrhea, or constipation. Denies dysuria, malodorous urine, or cloudy urine. He denies any headache or visual changes.  He still has a significant skin rash likely secondary to his immunotherapy with Keytruda.  He completed his prednisone taper.  Of note, the patient does have a history of psoriasis.  They have been using topical Kenalog cream for his rash.  He was supposed to have a restaging CT scan of his chest performed prior to his appointment today; however, this was canceled secondary to his recent hospitalization.  The patient is here today for evaluation and his schedule treatment with cycle #4.     MEDICAL HISTORY: Past Medical History:  Diagnosis Date  . Cancer ([facility])    mass right lung  . COPD (chronic obstructive pulmonary disease) ([facility])    QUIT SMOKING 30 YRS AGO  . Diabetes ([facility]) 2018  . HTN (hypertension)   . Psoriatic arthritis ([facility]) 2015    ALLERGIES:  is allergic to chlorhexidine and aleve [naproxen sodium].  MEDICATIONS:  Current Outpatient Medications  Medication Sig Dispense Refill  . acetaminophen (TYLENOL) 325 MG tablet Take 2 tablets (650 mg total) by mouth every 6 (six) hours as needed for mild pain, fever or headache. 120 tablet 0  . albuterol (VENTOLIN HFA) 108 (90 Base) MCG/ACT inhaler Inhale 2 puffs into the lungs every 6 (  six) hours as needed for wheezing or shortness of breath.    . diltiazem (CARDIZEM CD) 360 MG 24 hr capsule Take 1 capsule (360 mg total) by mouth daily. 30 capsule 3  . doxycycline (MONODOX) 50 MG capsule Take 50 mg by mouth 2 (two) times daily as needed (rosacea flare ups.).    Marland Kitchen ferrous sulfate 325 (65 FE) MG tablet Take 325 mg by mouth daily with supper.    Marland Kitchen FLOVENT HFA 110 MCG/ACT inhaler 1 puff 2 (two) times daily.     . folic acid (FOLVITE) 1 MG tablet Take 1 mg by mouth daily.    Marland Kitchen gabapentin (NEURONTIN) 300 MG capsule Take 600 mg by mouth at bedtime.    . lidocaine-prilocaine (EMLA) cream Apply to the Port-A-Cath site 30 minutes before treatment 30 g 0  . loratadine (CLARITIN) 10 MG tablet Take 10 mg by mouth daily.    . metFORMIN (GLUCOPHAGE-XR) 500 MG 24 hr tablet Take 500-1,000 mg by mouth See admin instructions. Take 2 tablets (1000 mg) by mouth in the morning & 1 tablet (500 mg) by mouth at night.    . metoprolol tartrate (LOPRESSOR) 25 MG tablet Take 1 tablet (25 mg total) by mouth 2 (two) times daily. 60 tablet 2  . metroNIDAZOLE (METROCREAM) 0.75 % cream Apply 1 application topically 2 (two) times daily as needed (facial rosacea).     . mometasone (ELOCON) 0.1 % cream Apply 1 application topically 2 (two) times daily as needed (psoriasis).     . Omega-3 Fatty Acids (FISH OIL) 1000 MG CAPS Take 1,000 mg by mouth daily.     . pantoprazole (PROTONIX) 40 MG tablet 1 po 30 mins prior to first meal 30 tablet 11  . prochlorperazine (COMPAZINE) 10 MG tablet Take 1 tablet (10 mg total) by mouth every 6 (six) hours as needed for nausea or vomiting. 30 tablet 0  . senna-docusate (SENOKOT-S) 8.6-50 MG tablet Take 2 tablets by mouth at bedtime. 60 tablet 1  . traMADol (ULTRAM) 50 MG tablet Take 50 mg by mouth every 4 (four) hours as needed. for pain    . triamcinolone lotion (KENALOG) 0.1 % Apply 1 application topically 4 (four) times daily. 120 mL 1  . umeclidinium-vilanterol (ANORO ELLIPTA) 62.5-25 MCG/INH AEPB Inhale 1 puff into the lungs daily. 30 each 0   No current facility-administered medications for this visit.   Facility-Administered Medications Ordered in Other Visits  Medication Dose Route Frequency Provider Last Rate Last Admin  . heparin lock flush 100 unit/mL  250 Units Intracatheter PRN Haru Anspaugh L, PA-C      . sodium chloride flush (NS) 0.9 % injection 10 mL  10 mL Intracatheter  PRN Lashe Oliveira L, PA-C        SURGICAL HISTORY:  Past Surgical History:  Procedure Laterality Date  . BACK SURGERY  1990  . BIOPSY  12/30/2018   Procedure: BIOPSY;  Surgeon: Danie Binder, MD;  [facility];  Service: Endoscopy;;  gastric  . CATARACT EXTRACTION Bilateral   . CERVICAL SPINE SURGERY  1995  . COLONOSCOPY WITH PROPOFOL N/A 12/30/2018   Procedure: COLONOSCOPY WITH PROPOFOL;  Surgeon: Danie Binder, MD;  [facility];  Service: Endoscopy;  Laterality: N/A;  12:30pm  . ESOPHAGOGASTRODUODENOSCOPY (EGD) WITH PROPOFOL N/A 12/30/2018   Procedure: ESOPHAGOGASTRODUODENOSCOPY (EGD) WITH PROPOFOL;  Surgeon: Danie Binder, MD;  [facility];  Service: Endoscopy;  Laterality: N/A;  . IR IMAGING GUIDED PORT INSERTION  09/04/2019  . POLYPECTOMY  12/30/2018   Procedure: POLYPECTOMY;  Surgeon: Danie Binder, MD;  [facility];  Service: Endoscopy;;  colon  . THORACENTESIS Right 08/08/2019   Procedure: Thoracentesis;  Surgeon: [name], MD;  Location: [facility];  Service: Pulmonary;  Laterality: Right;  Marland Kitchen VIDEO BRONCHOSCOPY Right 08/08/2019   Procedure: Video Bronchoscopy with Erbe Cryo Biopsy of Right mainstem;  Surgeon: [name], MD;  Location: [facility] OR;  Service: Pulmonary;  Laterality: Right;    REVIEW OF SYSTEMS:   Review of Systems  Constitutional: Positive for fatigue, generalized weakness, and poor appetite.  Negative for  chills, fever and unexpected weight change.  HENT: Negative for mouth sores, nosebleeds, sore throat and trouble swallowing.   Eyes: Negative for eye problems and icterus.  Respiratory: Positive for baseline shortness of breath and cough.  Negative for hemoptysis,  and wheezing.   Cardiovascular: Negative for chest pain and leg swelling.  Gastrointestinal: Negative for abdominal pain, constipation, diarrhea, nausea and vomiting.  Genitourinary: Negative for bladder incontinence, difficulty urinating,  dysuria, frequency and hematuria.   Musculoskeletal: Positive for generalized weakness/deconditioning. Negative for back pain, gait problem, neck pain and neck stiffness.  Skin: Negative for itching and rash.  Neurological: Negative for dizziness, extremity weakness, gait problem, headaches, light-headedness and seizures.  Hematological: Negative for adenopathy. Does not bruise/bleed easily.  Psychiatric/Behavioral: Negative for confusion, depression and sleep disturbance. The patient is not nervous/anxious.     PHYSICAL EXAMINATION:  Blood pressure 134/75, pulse 95, temperature 98.1 F (36.7 C), temperature source Temporal, resp. rate (!) 24, SpO2 98 %.  ECOG PERFORMANCE STATUS: 3 - Symptomatic, >50% confined to bed  Physical Exam  Constitutional: Oriented to person, place, and time and chronically ill-appearing male and in no acute distress.   HENT:  Head: Normocephalic and atraumatic.  Mouth/Throat: Oropharynx is clear and moist. No oropharyngeal exudate.  Eyes: Conjunctivae are normal. Right eye exhibits no discharge. Left eye exhibits no discharge. No scleral icterus.  Neck: Normal range of motion. Neck supple.  Cardiovascular: Normal rate, regular rhythm, normal heart sounds and intact distal pulses.   Pulmonary/Chest: Effort normal.  Decreased breath sounds in the right middle/lower lung fields.  No respiratory distress. No wheezes. No rales.  Abdominal: Soft. Bowel sounds are normal. Exhibits no distension and no mass. There is no tenderness.  Musculoskeletal: Exhibits no edema.  The patient was examined in the wheelchair. Lymphadenopathy:    No cervical adenopathy.  Neurological: Alert and oriented to person, place, and time. Exhibits normal muscle tone.  The patient was examined in the wheelchair.  The patient was a to assist with transferring in the clinic today. Skin: Skin is warm and dry. No rash noted. Not diaphoretic. No erythema. No pallor.  Psychiatric: Mood, memory and  judgment normal.  Vitals reviewed.  LABORATORY DATA: Lab Results  Component Value Date   WBC 8.8 11/05/2019   HGB 7.8 (L) 11/05/2019   HCT 25.9 (L) 11/05/2019   MCV 94.2 11/05/2019   PLT 288 11/05/2019      Chemistry      Component Value Date/Time   NA 140 11/05/2019 0942   K 4.3 11/05/2019 0942   CL 100 11/05/2019 0942   CO2 28 11/05/2019 0942   BUN 14 11/05/2019 0942   CREATININE 0.76 11/05/2019 0942      Component Value Date/Time   CALCIUM 9.0 11/05/2019 0942   ALKPHOS 67 11/05/2019 0942   AST 17 11/05/2019 0942   ALT  20 11/05/2019 0942   BILITOT 0.4 11/05/2019 1117       RADIOGRAPHIC STUDIES:  DG Chest 2 View  Result Date: 10/16/2019 CLINICAL DATA:  Lung cancer and weakness EXAM: CHEST - 2 VIEW COMPARISON:  08/06/2019 CT.  PET-CT 09/08/2019 FINDINGS: Right lower chest opacification with volume loss from necrotic mass by comparison CT. Small to moderate right lateral pleural effusion has increased from prior. Left chest is clear. Stable heart size. Right port with tip at the SVC. IMPRESSION: Necrotic right lung mass as seen on recent staging scan. An overlying right pleural effusion has increased from April. Electronically Signed   By: Monte Fantasia M.D.   On: 10/16/2019 04:43   CT HEAD WO CONTRAST  Result Date: 10/16/2019 CLINICAL DATA:  Mental status change, unknown cause. Additional provided: Weakness, history of lung cancer EXAM: CT HEAD WITHOUT CONTRAST TECHNIQUE: Contiguous axial images were obtained from the base of the skull through the vertex without intravenous contrast. COMPARISON:  Brain MRI 08/10/2019 FINDINGS: Brain: Please note there is limited assessment for intracranial metastatic disease on this noncontrast CT study. Mild ill-defined hypoattenuation within the cerebral white matter is nonspecific, but consistent with chronic small vessel ischemic disease. Stable, mild generalized parenchymal atrophy. There is no acute intracranial hemorrhage. No  demarcated cortical infarct. No extra-axial fluid collection. No evidence of intracranial mass. No midline shift. Vascular: No hyperdense vessel.  Atherosclerotic calcifications Skull: Normal. Negative for fracture or focal lesion. Sinuses/Orbits: Visualized orbits show no acute finding. Paranasal sinus mucosal thickening, most notable within bilateral ethmoid air cells. No significant mastoid effusion. IMPRESSION: 1. Please note there is limited assessment for intracranial metastatic disease on this non-contrast CT study. 2. No evidence of acute intracranial abnormality. 3. Mild generalized parenchymal atrophy and chronic small vessel ischemic disease. 4. Paranasal sinus mucosal thickening, most notably ethmoid. Electronically Signed   By: Kellie Simmering DO   On: 10/16/2019 09:52   DG Chest Port 1 View  Result Date: 10/19/2019 CLINICAL DATA:  Fever and urinary complaints EXAM: PORTABLE CHEST 1 VIEW COMPARISON:  Radiograph 10/16/2019 FINDINGS: Likely increasing size of the complex right pleural effusion with associated passive atelectasis though some underlying consolidation is not excluded. A necrotic lung mass is again seen in the right middle lobe, better visualized on cross-sectional imaging. Left lung is predominantly clear aside from basilar atelectasis. Left apical scarring as well. Cardiomediastinal contours are unchanged from prior, partially obscured in the right lung base by overlying opacity. Right IJ Port-A-Cath tip terminates at the superior cavoatrial junction. Telemetry leads overlie the chest. Degenerative changes are present in the imaged spine and shoulders. IMPRESSION: 1. Likely increasing size of the complex right pleural effusion with associated passive atelectasis though some underlying consolidation is not excluded in the setting of fever. 2. Necrotic right lung mass, better visualized on cross-sectional imaging. Electronically Signed   By: Lovena Le M.D.   On: 10/19/2019 17:39    ECHOCARDIOGRAM COMPLETE  Result Date: 10/09/2019    ECHOCARDIOGRAM REPORT   Patient Name:   MARGARET STAGGS Rossin Date of Exam: 10/09/2019 Medical Rec #:  [id number]     Height:       74.0 in Accession #:    [id number]    Weight:       179.6 lb Date of Birth:  [birth year]     BSA:          2.076 m Patient Age:    67 years      BP:  117/71 mmHg Patient Gender: M             HR:           94 bpm. Exam Location:  [facility] Procedure: 2D Echo, Cardiac Doppler and Color Doppler Indications:     I48.0 PAF  History:         Patient has no prior history of Echocardiogram examinations.                  COPD and Stage IV lung cancer. Anemia, Arrythmias:Atrial                  Fibrillation; Risk Factors:Hypertension, Diabetes and Former                  Smoker.  Sonographer:     Jeneen Montgomery RDMS, RVT, RDCS Referring Phys:  [id number] Alphonse Guild BRANCH Diagnosing Phys: Kate Sable MD  Sonographer Comments: Technically difficult study due to poor echo windows. Image acquisition challenging due to COPD and Image acquisition challenging due to patient body habitus. IMPRESSIONS  1. Left ventricular ejection fraction, by estimation, is 60 to 65%. The left ventricle has normal function. Left ventricular endocardial border not optimally defined to evaluate regional wall motion. Left ventricular diastolic parameters are consistent with Grade I diastolic dysfunction (impaired relaxation).  2. Right ventricular systolic function is normal. The right ventricular size is moderately enlarged.  3. Right atrial size was mild to moderately dilated.  4. The mitral valve was not well visualized. Trivial mitral valve regurgitation.  5. Tricuspid valve regurgitation unable to assess.  6. The aortic valve was not well visualized. Aortic valve regurgitation is mild.  7. Pulmonic valve regurgitation unable to assess.  8. Aortic dilatation noted. There is mild dilatation of the aortic root. Conclusion(s)/Recommendation(s): Suboptimal study.  Consider contrast enhancement for future studies. FINDINGS  Left Ventricle: Left ventricular ejection fraction, by estimation, is 60 to 65%. The left ventricle has normal function. Left ventricular endocardial border not optimally defined to evaluate regional wall motion. The left ventricular internal cavity size was normal in size. There is no left ventricular hypertrophy. Left ventricular diastolic parameters are consistent with Grade I diastolic dysfunction (impaired relaxation). Normal left ventricular filling pressure. Right Ventricle: The right ventricular size is moderately enlarged. Right vetricular wall thickness was not assessed. Right ventricular systolic function is normal. Left Atrium: Left atrial size was normal in size. Right Atrium: Right atrial size was mild to moderately dilated. Pericardium: There is no evidence of pericardial effusion. Mitral Valve: The mitral valve was not well visualized. Trivial mitral valve regurgitation. Tricuspid Valve: The tricuspid valve is not well visualized. Tricuspid valve regurgitation unable to assess. Aortic Valve: The aortic valve was not well visualized. Aortic valve regurgitation is mild. Pulmonic Valve: The pulmonic valve was not well visualized. Pulmonic valve regurgitation unable to assess. Aorta: Aortic dilatation noted. There is mild dilatation of the aortic root. Venous: The inferior vena cava was not well visualized. IAS/Shunts: The interatrial septum was not well visualized.  LEFT VENTRICLE PLAX 2D LVIDd:         3.88 cm      Diastology LVIDs:         2.68 cm      LV e' lateral:   5.62 cm/s LV PW:         0.96 cm      LV E/e' lateral: 11.1 LV IVS:        1.21 cm  LV e' medial:    7.86 cm/s LVOT diam:     2.00 cm      LV E/e' medial:  8.0 LV SV:         57 LV SV Index:   27 LVOT Area:     3.14 cm  LV Volumes (MOD) LV vol d, MOD A2C: 68.9 ml LV vol d, MOD A4C: 118.0 ml LV vol s, MOD A2C: 37.0 ml LV vol s, MOD A4C: 47.6 ml LV SV MOD A2C:     31.9 ml LV  SV MOD A4C:     118.0 ml LV SV MOD BP:      54.6 ml RIGHT VENTRICLE RV S prime:     14.50 cm/s TAPSE (M-mode): 2.8 cm LEFT ATRIUM             Index LA diam:        2.70 cm 1.30 cm/m LA Vol (A2C):   56.7 ml 27.31 ml/m LA Vol (A4C):   40.7 ml 19.60 ml/m LA Biplane Vol:         23.90 ml/m  AORTIC VALVE LVOT Vmax:   103.00 cm/s LVOT Vmean:  64.000 cm/s LVOT VTI:    0.181 m  AORTA Ao Root diam: 3.80 cm MITRAL VALVE               TRICUSPID VALVE MV Area (PHT):             TR Peak grad:   3.0 mmHg MV Decel Time: 143 msec    TR Vmax:        86.30 cm/s MR Peak grad: 21.8 mmHg MR Vmax:      233.50 cm/s  SHUNTS MV E velocity: 62.60 cm/s  Systemic VTI:  0.18 m MV A velocity: 70.80 cm/s  Systemic Diam: 2.00 cm MV E/A ratio:  0.88 Kate Sable MD Electronically signed by Kate Sable MD Signature Date/Time: 10/09/2019/5:58:14 PM    Final    DG HIP UNILAT WITH PELVIS 2-3 VIEWS RIGHT  Result Date: 10/18/2019 CLINICAL DATA:  Right hip pain for 5 days following fall, initial encounter EXAM: DG HIP (WITH OR WITHOUT PELVIS) 2-3V RIGHT COMPARISON:  None. 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 No acute fracture or dislocation is seen. No soft tissue abnormality is noted. IMPRESSION: Degenerative change without acute abnormality. Electronically Signed   By: Inez Catalina M.D.   On: 10/18/2019 12:21     ASSESSMENT/PLAN:  This is a very pleasant 40 year oldCaucasianmale with stage IIIc/IV (T4, N2, M0/M1 a) non-small cell lung cancer, squamous cell carcinoma. Hepresented with large necrotic mass extending to the carina with associated obstruction of the right lower lobe bronchus and postobstructive collapse/consolidation and necrosis in the right lower lobe as well as mediastinal lymphadenopathy with 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   The patient has PD-L1 expression of 20%.  The patient completed? Palliative radiotherapy to the obstructed right  lower lobe lung mass under the care of Dr. Sondra Come. Last treatment on3/25/2021  The patient is currently undergoing systemic chemotherapy with carboplatin for an AUC of 5, paclitaxel 175 mg/m2, and Ketyruda 200 mg IV every 3 weeks.  He is status post 3 cycles of treatment. [name] was discontinued after cycle #1 due to a significant skin rash.   The patient was seen with Dr. Julien Nordmann today.  The patient is still very weak/deconditioned today.  He will not receive cycle #4 today as scheduled.  We will  delay his treatment by 1 week for improvement in his general condition prior to resuming his treatment.  In the meantime, the patient will receive 2 units of packed RBCs today for his anemia.  His hemoglobin is 7.9 today.  On exam today, the patient had decreased breath sounds in the right lower and middle lung field.  We will arrange for the patient to have a therapeutic thoracentesis performed in the next few days.   Advised the patient's wife to call radiology scheduling to reschedule his restaging CT scan of the chest, abdomen, and pelvis at their earliest availability.   We will see the patient back for follow-up visit in 1 week for evaluation before starting cycle #4.  The patient was advised to call immediately if he has any concerning symptoms in the interval. The patient voices understanding of current disease status and treatment options and is in agreement with the current care plan. All questions were answered. The patient knows to call the clinic with any problems, questions or concerns. We can certainly see the patient much sooner if necessary.  Orders Placed This Encounter  Procedures  . US Thoracentesis Asp Pleural space w/IMG guide    Standing Status:   Future    Standing Expiration Date:   11/04/2020    Order Specific Question:   Are labs required for specimen collection?    Answer:   No    Order Specific Question:   Reason for Exam (SYMPTOM  OR DIAGNOSIS REQUIRED)    Answer:    Right sided pleural effusion    Order Specific Question:   Preferred imaging location?    Answer:   [facility]  . Informed Consent Details: Physician/Practitioner Attestation; Transcribe to consent form and obtain patient signature    Standing Status:   Future    Standing Expiration Date:   11/04/2020    Order Specific Question:   Physician/Practitioner attestation of informed consent for blood and or blood product transfusion    Answer:   I, the physician/practitioner, attest that I have discussed with the patient the benefits, risks, side effects, alternatives, likelihood of achieving goals and potential problems during recovery for the procedure that I have provided informed consent.    Order Specific Question:   Product(s)    Answer:   All Product(s)  . Care order/instruction    Transfuse Parameters    Standing Status:   Future    Standing Expiration Date:   11/04/2020  . Care order/instruction    Transfuse Parameters    Standing Status:   Future    Standing Expiration Date:   11/04/2020  . Type and screen    Standing Status:   Future    Number of Occurrences:   1    Standing Expiration Date:   11/04/2020  . Type and screen    Standing Status:   Future    Standing Expiration Date:   11/04/2020     Tobe Sos Sarahelizabeth Conway, PA-C 11/05/19  ADDENDUM: Hematology/Oncology Attending: I had a face-to-face encounter with the patient today.  I recommended his care plan.  This is a very pleasant 74 years old white male with stage IV non-small cell lung cancer, squamous cell carcinoma initially treated with 1 cycle of systemic chemotherapy with carboplatin, paclitaxel and Keytruda.  [name] was discontinued after cycle #1 because of the significant skin rash and flare of his psoriasis.  The patient was treated with a tapered dose of prednisone and he still have residual skin rash.  He was treated with 2 cycles of systemic chemotherapy with carboplatin and paclitaxel and tolerated the  treatment well.  He was recently admitted to the hospital twice the first 1 with progressive right pleural effusion status post thoracentesis.  Few days later he was admitted to [facility] with sepsis secondary to urinary tract infection.  The patient is feeling a little bit better but continues to have increasing fatigue and weakness.  He has an earlier fall when he was in the restroom. He was supposed to resume his chemotherapy today but the patient is so weak. I recommended for the patient to delay the start of cycle number 4 x 1 week until improvement of his condition.  In the meantime we will arrange for the patient to have repeat CT scan of the chest, abdomen and pelvis for restaging of his disease. On physical exam today, the patient has dullness to percussion and decreased breath sound on the lower half of his right lung questionable for reaccumulation of the right pleural effusion.  I will arrange for the patient to have ultrasound-guided right thoracentesis. For the chemotherapy-induced anemia, will arrange for the patient to receive 2 units of PRBCs transfusion. The patient will come back for follow-up visit in 1 week for evaluation and discussion of his scan results before proceeding with cycle #4. He was advised to call immediately if he has any other concerning symptoms in the interval.  Disclaimer: This note was dictated with voice recognition software. Similar sounding words can inadvertently be transcribed and may be missed upon review. Eilleen Kempf, MD 11/05/19

## 2019-11-05 NOTE — Progress Notes (Signed)
Per Cassie, PA no treatment today. Hemoglobin is 7.8. Patient will receive 2 units of blood instead.

## 2019-11-05 NOTE — Progress Notes (Signed)
Nutrition follow-up completed with patient receiving chemotherapy and radiation treatments for lung cancer. Last weight documented was 180 pounds on May 16. Patient was not weighed today. Patient reports fair appetite.  He does not elaborate on foods he can eat.  He has been drinking chocolate Ensure. Noted labs: Glucose 154, albumin 1.7, hemoglobin 7.8.  Nutrition diagnosis: Unintentional weight loss cannot be evaluated.  Intervention: Patient encouraged to continue to drink Ensure Enlive or Ensure Plus frequently throughout the day.  Recommended 2-3 bottles. Educated patient to add high-calorie high-protein snacks. Questions were answered.  Teach back method used.  Monitoring, evaluation, goals: Patient will tolerate increased calories and protein to minimize weight loss.  Next visit: Monday, June 21 during infusion.  **Disclaimer: This note was dictated with voice recognition software. Similar sounding words can inadvertently be transcribed and this note may contain transcription errors which may not have been corrected upon publication of note.**

## 2019-11-06 DIAGNOSIS — I5032 Chronic diastolic (congestive) heart failure: Secondary | ICD-10-CM | POA: Diagnosis not present

## 2019-11-06 DIAGNOSIS — Z79899 Other long term (current) drug therapy: Secondary | ICD-10-CM | POA: Diagnosis not present

## 2019-11-06 DIAGNOSIS — I480 Paroxysmal atrial fibrillation: Secondary | ICD-10-CM | POA: Diagnosis not present

## 2019-11-06 DIAGNOSIS — N390 Urinary tract infection, site not specified: Secondary | ICD-10-CM | POA: Diagnosis not present

## 2019-11-06 DIAGNOSIS — B957 Other staphylococcus as the cause of diseases classified elsewhere: Secondary | ICD-10-CM | POA: Diagnosis not present

## 2019-11-06 DIAGNOSIS — J189 Pneumonia, unspecified organism: Secondary | ICD-10-CM | POA: Diagnosis not present

## 2019-11-06 DIAGNOSIS — Z87891 Personal history of nicotine dependence: Secondary | ICD-10-CM | POA: Diagnosis not present

## 2019-11-06 DIAGNOSIS — L4050 Arthropathic psoriasis, unspecified: Secondary | ICD-10-CM | POA: Diagnosis not present

## 2019-11-06 DIAGNOSIS — C3491 Malignant neoplasm of unspecified part of right bronchus or lung: Secondary | ICD-10-CM | POA: Diagnosis not present

## 2019-11-06 DIAGNOSIS — I110 Hypertensive heart disease with heart failure: Secondary | ICD-10-CM | POA: Diagnosis not present

## 2019-11-06 DIAGNOSIS — R627 Adult failure to thrive: Secondary | ICD-10-CM | POA: Diagnosis not present

## 2019-11-06 DIAGNOSIS — Z7984 Long term (current) use of oral hypoglycemic drugs: Secondary | ICD-10-CM | POA: Diagnosis not present

## 2019-11-06 DIAGNOSIS — E119 Type 2 diabetes mellitus without complications: Secondary | ICD-10-CM | POA: Diagnosis not present

## 2019-11-06 DIAGNOSIS — D630 Anemia in neoplastic disease: Secondary | ICD-10-CM | POA: Diagnosis not present

## 2019-11-06 DIAGNOSIS — J440 Chronic obstructive pulmonary disease with acute lower respiratory infection: Secondary | ICD-10-CM | POA: Diagnosis not present

## 2019-11-06 DIAGNOSIS — J9621 Acute and chronic respiratory failure with hypoxia: Secondary | ICD-10-CM | POA: Diagnosis not present

## 2019-11-06 DIAGNOSIS — B952 Enterococcus as the cause of diseases classified elsewhere: Secondary | ICD-10-CM | POA: Diagnosis not present

## 2019-11-06 LAB — TYPE AND SCREEN
ABO/RH(D): A POS
Antibody Screen: NEGATIVE
Unit division: 0
Unit division: 0

## 2019-11-06 LAB — BPAM RBC
Blood Product Expiration Date: 202106082359
Blood Product Expiration Date: 202106142359
ISSUE DATE / TIME: 202106021245
ISSUE DATE / TIME: 202106021245
Unit Type and Rh: 6200
Unit Type and Rh: 6200

## 2019-11-07 ENCOUNTER — Ambulatory Visit (HOSPITAL_COMMUNITY)
Admission: RE | Admit: 2019-11-07 | Discharge: 2019-11-07 | Disposition: A | Discharge status: Home / Self Care | Payer: Medicare Other | Source: Ambulatory Visit | Attending: Internal Medicine | Admitting: Internal Medicine

## 2019-11-07 ENCOUNTER — Telehealth: Payer: Self-pay | Admitting: Medical Oncology

## 2019-11-07 ENCOUNTER — Inpatient Hospital Stay: Payer: Medicare Other

## 2019-11-07 ENCOUNTER — Other Ambulatory Visit: Payer: Self-pay

## 2019-11-07 DIAGNOSIS — R300 Dysuria: Secondary | ICD-10-CM | POA: Diagnosis not present

## 2019-11-07 DIAGNOSIS — R350 Frequency of micturition: Secondary | ICD-10-CM | POA: Diagnosis not present

## 2019-11-07 DIAGNOSIS — C3490 Malignant neoplasm of unspecified part of unspecified bronchus or lung: Secondary | ICD-10-CM

## 2019-11-07 MED ORDER — IOHEXOL 300 MG/ML  SOLN
100.0000 mL | Freq: Once | INTRAMUSCULAR | Status: AC | PRN
  Administered 2019-11-07: 100 mL via INTRAVENOUS

## 2019-11-07 NOTE — Telephone Encounter (Signed)
I returned call to radiology re called report. I was  Instructed to see impression #1adn notify [facility].  "IMPRESSION: 1. Increased size of loculated pleural fluid in the RIGHT chest with new rim of enhancement. Findings may represent enlarging reactive effusion following radiation. Correlate with any clinical signs of infection as empyema could have a similar appearance. Given size of effusion would also correlate with any worsening shortness of breath. No gas present within the fluid."  Messaged Mohamed.

## 2019-11-07 NOTE — Telephone Encounter (Signed)
I spoke to Dr Julien Nordmann and read impression #1. Per Dr Julien Nordmann I called pt for any signs of fever, chills. I spoke to Jeremy [name] , his wife. She said, " a little while ago Jeremy [name]  felt hot and temp was 99 f. I had given him tylenol before this episode."  I instructed her to take Jeremy [name]'s temp before she gives tylenol and if it is 100.5 or > , he has chills with or without fever , he needs to go to ED. She voiced understanding.

## 2019-11-08 ENCOUNTER — Other Ambulatory Visit: Payer: Self-pay

## 2019-11-08 ENCOUNTER — Other Ambulatory Visit (HOSPITAL_COMMUNITY)
Admission: RE | Admit: 2019-11-08 | Discharge: 2019-11-08 | Disposition: A | Discharge status: Home / Self Care | Payer: Medicare Other | Source: Ambulatory Visit | Attending: Physician Assistant | Admitting: Physician Assistant

## 2019-11-08 ENCOUNTER — Emergency Department (HOSPITAL_COMMUNITY): Payer: Medicare Other

## 2019-11-08 ENCOUNTER — Inpatient Hospital Stay (HOSPITAL_COMMUNITY)
Admission: EM | Admit: 2019-11-08 | Discharge: 2019-11-18 | DRG: 178 | Disposition: A | Discharge status: Home Health | Payer: Medicare Other | Attending: Internal Medicine | Admitting: Internal Medicine

## 2019-11-08 ENCOUNTER — Encounter (HOSPITAL_COMMUNITY): Payer: Self-pay | Admitting: Emergency Medicine

## 2019-11-08 DIAGNOSIS — J90 Pleural effusion, not elsewhere classified: Secondary | ICD-10-CM | POA: Diagnosis not present

## 2019-11-08 DIAGNOSIS — R918 Other nonspecific abnormal finding of lung field: Secondary | ICD-10-CM | POA: Diagnosis not present

## 2019-11-08 DIAGNOSIS — I10 Essential (primary) hypertension: Secondary | ICD-10-CM | POA: Diagnosis present

## 2019-11-08 DIAGNOSIS — Z833 Family history of diabetes mellitus: Secondary | ICD-10-CM

## 2019-11-08 DIAGNOSIS — J431 Panlobular emphysema: Secondary | ICD-10-CM

## 2019-11-08 DIAGNOSIS — Z01812 Encounter for preprocedural laboratory examination: Secondary | ICD-10-CM | POA: Insufficient documentation

## 2019-11-08 DIAGNOSIS — D6869 Other thrombophilia: Secondary | ICD-10-CM | POA: Diagnosis not present

## 2019-11-08 DIAGNOSIS — Z8249 Family history of ischemic heart disease and other diseases of the circulatory system: Secondary | ICD-10-CM

## 2019-11-08 DIAGNOSIS — Z20822 Contact with and (suspected) exposure to covid-19: Secondary | ICD-10-CM | POA: Diagnosis present

## 2019-11-08 DIAGNOSIS — J869 Pyothorax without fistula: Principal | ICD-10-CM | POA: Diagnosis present

## 2019-11-08 DIAGNOSIS — Z811 Family history of alcohol abuse and dependence: Secondary | ICD-10-CM | POA: Diagnosis not present

## 2019-11-08 DIAGNOSIS — R3915 Urgency of urination: Secondary | ICD-10-CM | POA: Diagnosis present

## 2019-11-08 DIAGNOSIS — D6859 Other primary thrombophilia: Secondary | ICD-10-CM | POA: Diagnosis present

## 2019-11-08 DIAGNOSIS — B961 Klebsiella pneumoniae [K. pneumoniae] as the cause of diseases classified elsewhere: Secondary | ICD-10-CM | POA: Diagnosis present

## 2019-11-08 DIAGNOSIS — J449 Chronic obstructive pulmonary disease, unspecified: Secondary | ICD-10-CM | POA: Diagnosis present

## 2019-11-08 DIAGNOSIS — E119 Type 2 diabetes mellitus without complications: Secondary | ICD-10-CM | POA: Diagnosis not present

## 2019-11-08 DIAGNOSIS — Z888 Allergy status to other drugs, medicaments and biological substances status: Secondary | ICD-10-CM

## 2019-11-08 DIAGNOSIS — Z7984 Long term (current) use of oral hypoglycemic drugs: Secondary | ICD-10-CM

## 2019-11-08 DIAGNOSIS — Z923 Personal history of irradiation: Secondary | ICD-10-CM | POA: Diagnosis not present

## 2019-11-08 DIAGNOSIS — Z7189 Other specified counseling: Secondary | ICD-10-CM | POA: Diagnosis not present

## 2019-11-08 DIAGNOSIS — R0902 Hypoxemia: Secondary | ICD-10-CM

## 2019-11-08 DIAGNOSIS — Z4682 Encounter for fitting and adjustment of non-vascular catheter: Secondary | ICD-10-CM | POA: Diagnosis not present

## 2019-11-08 DIAGNOSIS — J811 Chronic pulmonary edema: Secondary | ICD-10-CM | POA: Diagnosis not present

## 2019-11-08 DIAGNOSIS — D630 Anemia in neoplastic disease: Secondary | ICD-10-CM | POA: Diagnosis present

## 2019-11-08 DIAGNOSIS — J439 Emphysema, unspecified: Secondary | ICD-10-CM | POA: Diagnosis not present

## 2019-11-08 DIAGNOSIS — Z9689 Presence of other specified functional implants: Secondary | ICD-10-CM

## 2019-11-08 DIAGNOSIS — Z515 Encounter for palliative care: Secondary | ICD-10-CM | POA: Diagnosis not present

## 2019-11-08 DIAGNOSIS — E876 Hypokalemia: Secondary | ICD-10-CM | POA: Diagnosis not present

## 2019-11-08 DIAGNOSIS — N390 Urinary tract infection, site not specified: Secondary | ICD-10-CM | POA: Diagnosis present

## 2019-11-08 DIAGNOSIS — Z85118 Personal history of other malignant neoplasm of bronchus and lung: Secondary | ICD-10-CM | POA: Diagnosis not present

## 2019-11-08 DIAGNOSIS — Z87891 Personal history of nicotine dependence: Secondary | ICD-10-CM

## 2019-11-08 DIAGNOSIS — R05 Cough: Secondary | ICD-10-CM | POA: Diagnosis not present

## 2019-11-08 DIAGNOSIS — J189 Pneumonia, unspecified organism: Secondary | ICD-10-CM | POA: Diagnosis not present

## 2019-11-08 DIAGNOSIS — Z66 Do not resuscitate: Secondary | ICD-10-CM | POA: Diagnosis present

## 2019-11-08 DIAGNOSIS — T451X5A Adverse effect of antineoplastic and immunosuppressive drugs, initial encounter: Secondary | ICD-10-CM | POA: Diagnosis present

## 2019-11-08 DIAGNOSIS — L4050 Arthropathic psoriasis, unspecified: Secondary | ICD-10-CM | POA: Diagnosis present

## 2019-11-08 DIAGNOSIS — C3491 Malignant neoplasm of unspecified part of right bronchus or lung: Secondary | ICD-10-CM | POA: Diagnosis not present

## 2019-11-08 DIAGNOSIS — J9811 Atelectasis: Secondary | ICD-10-CM | POA: Diagnosis not present

## 2019-11-08 DIAGNOSIS — J939 Pneumothorax, unspecified: Secondary | ICD-10-CM | POA: Diagnosis not present

## 2019-11-08 DIAGNOSIS — Z9889 Other specified postprocedural states: Secondary | ICD-10-CM | POA: Diagnosis not present

## 2019-11-08 DIAGNOSIS — Z9221 Personal history of antineoplastic chemotherapy: Secondary | ICD-10-CM

## 2019-11-08 DIAGNOSIS — Z7951 Long term (current) use of inhaled steroids: Secondary | ICD-10-CM

## 2019-11-08 DIAGNOSIS — L270 Generalized skin eruption due to drugs and medicaments taken internally: Secondary | ICD-10-CM | POA: Diagnosis present

## 2019-11-08 DIAGNOSIS — E1165 Type 2 diabetes mellitus with hyperglycemia: Secondary | ICD-10-CM | POA: Diagnosis not present

## 2019-11-08 DIAGNOSIS — I480 Paroxysmal atrial fibrillation: Secondary | ICD-10-CM | POA: Diagnosis present

## 2019-11-08 DIAGNOSIS — J9611 Chronic respiratory failure with hypoxia: Secondary | ICD-10-CM | POA: Diagnosis present

## 2019-11-08 DIAGNOSIS — Z79899 Other long term (current) drug therapy: Secondary | ICD-10-CM

## 2019-11-08 DIAGNOSIS — Z886 Allergy status to analgesic agent status: Secondary | ICD-10-CM

## 2019-11-08 LAB — CBC WITH DIFFERENTIAL/PLATELET
Abs Immature Granulocytes: 0.22 10*3/uL — ABNORMAL HIGH (ref 0.00–0.07)
Basophils Absolute: 0 10*3/uL (ref 0.0–0.1)
Basophils Relative: 0 %
Eosinophils Absolute: 0 10*3/uL (ref 0.0–0.5)
Eosinophils Relative: 0 %
HCT: 32.6 % — ABNORMAL LOW (ref 39.0–52.0)
Hemoglobin: 9.7 g/dL — ABNORMAL LOW (ref 13.0–17.0)
Immature Granulocytes: 3 %
Lymphocytes Relative: 10 %
Lymphs Abs: 0.9 10*3/uL (ref 0.7–4.0)
MCH: 27.4 pg (ref 26.0–34.0)
MCHC: 29.8 g/dL — ABNORMAL LOW (ref 30.0–36.0)
MCV: 92.1 fL (ref 80.0–100.0)
Monocytes Absolute: 0.8 10*3/uL (ref 0.1–1.0)
Monocytes Relative: 10 %
Neutro Abs: 6.8 10*3/uL (ref 1.7–7.7)
Neutrophils Relative %: 77 %
Platelets: 346 10*3/uL (ref 150–400)
RBC: 3.54 MIL/uL — ABNORMAL LOW (ref 4.22–5.81)
RDW: 17.9 % — ABNORMAL HIGH (ref 11.5–15.5)
WBC: 8.7 10*3/uL (ref 4.0–10.5)
nRBC: 0 % (ref 0.0–0.2)

## 2019-11-08 LAB — PROTIME-INR
INR: 1.1 (ref 0.8–1.2)
Prothrombin Time: 13.3 seconds (ref 11.4–15.2)

## 2019-11-08 LAB — URINALYSIS, ROUTINE W REFLEX MICROSCOPIC
Bilirubin Urine: NEGATIVE
Glucose, UA: NEGATIVE mg/dL
Ketones, ur: NEGATIVE mg/dL
Leukocytes,Ua: NEGATIVE
Nitrite: NEGATIVE
Protein, ur: 100 mg/dL — AB
Specific Gravity, Urine: 1.023 (ref 1.005–1.030)
pH: 5 (ref 5.0–8.0)

## 2019-11-08 LAB — I-STAT CHEM 8, ED
BUN: 14 mg/dL (ref 8–23)
Calcium, Ion: 1.06 mmol/L — ABNORMAL LOW (ref 1.15–1.40)
Chloride: 98 mmol/L (ref 98–111)
Creatinine, Ser: 0.8 mg/dL (ref 0.61–1.24)
Glucose, Bld: 193 mg/dL — ABNORMAL HIGH (ref 70–99)
HCT: 30 % — ABNORMAL LOW (ref 39.0–52.0)
Hemoglobin: 10.2 g/dL — ABNORMAL LOW (ref 13.0–17.0)
Potassium: 3.7 mmol/L (ref 3.5–5.1)
Sodium: 139 mmol/L (ref 135–145)
TCO2: 30 mmol/L (ref 22–32)

## 2019-11-08 LAB — COMPREHENSIVE METABOLIC PANEL
ALT: 23 U/L (ref 0–44)
AST: 22 U/L (ref 15–41)
Albumin: 2.2 g/dL — ABNORMAL LOW (ref 3.5–5.0)
Alkaline Phosphatase: 68 U/L (ref 38–126)
Anion gap: 16 — ABNORMAL HIGH (ref 5–15)
BUN: 16 mg/dL (ref 8–23)
CO2: 26 mmol/L (ref 22–32)
Calcium: 8.1 mg/dL — ABNORMAL LOW (ref 8.9–10.3)
Chloride: 97 mmol/L — ABNORMAL LOW (ref 98–111)
Creatinine, Ser: 0.9 mg/dL (ref 0.61–1.24)
GFR calc Af Amer: >60 mL/min (ref >60)
GFR calc non Af Amer: >60 mL/min (ref >60)
Glucose, Bld: 188 mg/dL — ABNORMAL HIGH (ref 70–99)
Potassium: 3.7 mmol/L (ref 3.5–5.1)
Sodium: 139 mmol/L (ref 135–145)
Total Bilirubin: 0.6 mg/dL (ref 0.3–1.2)
Total Protein: 8.4 g/dL — ABNORMAL HIGH (ref 6.5–8.1)

## 2019-11-08 LAB — LACTIC ACID, PLASMA: Lactic Acid, Venous: 2 mmol/L (ref 0.5–1.9)

## 2019-11-08 LAB — SARS CORONAVIRUS 2 (TAT 6-24 HRS): SARS Coronavirus 2: NEGATIVE

## 2019-11-08 MED ORDER — OXYCODONE HCL 5 MG PO TABS
5.0000 mg | ORAL_TABLET | Freq: Four times a day (QID) | ORAL | Status: DC | PRN
  Administered 2019-11-09 – 2019-11-11 (×5): 5 mg via ORAL
  Filled 2019-11-08 (×5): qty 1

## 2019-11-08 MED ORDER — ACETAMINOPHEN 325 MG PO TABS
650.0000 mg | ORAL_TABLET | Freq: Four times a day (QID) | ORAL | Status: DC | PRN
  Administered 2019-11-09: 650 mg via ORAL
  Filled 2019-11-08: qty 2

## 2019-11-08 MED ORDER — ONDANSETRON HCL 4 MG PO TABS: 4.0000 mg | ORAL_TABLET | Freq: Four times a day (QID) | ORAL | Status: DC | PRN

## 2019-11-08 MED ORDER — ACETAMINOPHEN 500 MG PO TABS
1000.0000 mg | ORAL_TABLET | Freq: Once | ORAL | Status: AC
  Administered 2019-11-08: 1000 mg via ORAL
  Filled 2019-11-08: qty 2

## 2019-11-08 MED ORDER — VANCOMYCIN HCL 1750 MG/350ML IV SOLN
1750.0000 mg | Freq: Once | INTRAVENOUS | Status: AC
  Administered 2019-11-08: 1750 mg via INTRAVENOUS
  Filled 2019-11-08: qty 350

## 2019-11-08 MED ORDER — METRONIDAZOLE IN NACL 5-0.79 MG/ML-% IV SOLN
500.0000 mg | Freq: Three times a day (TID) | INTRAVENOUS | Status: DC
  Administered 2019-11-09 – 2019-11-15 (×20): 500 mg via INTRAVENOUS
  Filled 2019-11-08 (×20): qty 100

## 2019-11-08 MED ORDER — VANCOMYCIN HCL IN DEXTROSE 1-5 GM/200ML-% IV SOLN: 1000.0000 mg | Freq: Once | INTRAVENOUS | Status: DC

## 2019-11-08 MED ORDER — SODIUM CHLORIDE 0.9 % IV SOLN
2.0000 g | Freq: Once | INTRAVENOUS | Status: AC
  Administered 2019-11-08: 2 g via INTRAVENOUS
  Filled 2019-11-08: qty 2

## 2019-11-08 MED ORDER — ACETAMINOPHEN 650 MG RE SUPP: 650.0000 mg | Freq: Four times a day (QID) | RECTAL | Status: DC | PRN

## 2019-11-08 MED ORDER — ONDANSETRON HCL 4 MG/2ML IJ SOLN: 4.0000 mg | Freq: Four times a day (QID) | INTRAMUSCULAR | Status: DC | PRN

## 2019-11-08 MED ORDER — TRAMADOL HCL 50 MG PO TABS
50.0000 mg | ORAL_TABLET | ORAL | Status: DC | PRN
  Administered 2019-11-09 – 2019-11-17 (×7): 50 mg via ORAL
  Filled 2019-11-08 (×7): qty 1

## 2019-11-08 MED ORDER — SODIUM CHLORIDE 0.9 % IV BOLUS
2000.0000 mL | Freq: Once | INTRAVENOUS | Status: AC
  Administered 2019-11-08: 2000 mL via INTRAVENOUS

## 2019-11-08 NOTE — H&P (Signed)
History and Physical    Jeremy [name] NKN:[id number] DOB: 1946/04/27 DOA: 11/08/2019  PCP: [name], MD   Patient coming from: Home   Chief Complaint: Fevers, right chest pain, cough   HPI: Jeremy [name] is a 74 y.o. male with medical history significant for stage IV lung cancer status post palliative radiotherapy and now on chemotherapy, COPD with chronic hypoxic respiratory failure, paroxysmal atrial fibrillation not anticoagulated, chronic anemia requiring periodic transfusions, and recent admission with sepsis now presenting to the emergency department for evaluation of fevers and cough.  Patient recently had a CT performed outpatient that was concerning for increased size of right pleural effusion with question of empyema raised.  In light of these findings, the patient was instructed to present to the emergency department if he develops signs of infection.  Patient became febrile today, has had a cough, does not feel particularly more short of breath than usual, but the cough is worse and the fever is new as of today.  He also has persistent urinary urgency and frequency without any lower abdominal or flank pain and without gross hematuria.  He is not aware of any prostate problems.  He has completed his antibiotics from the recent sepsis admission.  ED Course: Upon arrival to the ED, patient is found to be afebrile, saturating well on his usual 2 L/min of supplemental oxygen, tachycardic, and with stable blood pressure.  EKG features junctional tachycardia with rate 144.  Chest x-ray reveals stable loculated right pleural effusion with compressive atelectasis.  Chemistry panel notable for glucose of 188 and albumin 2.2.  CBC features a stable normocytic anemia with hemoglobin 9.7.  Lactic acid was slightly elevated.  Blood and urine cultures were collected in the ED, 2 L of normal saline were administered, and the patient was started on vancomycin and cefepime.  Review of Systems:  All  other systems reviewed and apart from HPI, are negative.  Past Medical History:  Diagnosis Date   Cancer ([place])    mass right lung   COPD (chronic obstructive pulmonary disease) ([place])    QUIT SMOKING 30 YRS AGO   Diabetes ([place]) 2018   HTN (hypertension)    Psoriatic arthritis ([place]) 2015    Past Surgical History:  Procedure Laterality Date   BACK SURGERY  1990   BIOPSY  12/30/2018   Procedure: BIOPSY;  Surgeon: Danie Binder, MD;  [facility];  Service: Endoscopy;;  gastric   CATARACT EXTRACTION Bilateral    CERVICAL SPINE SURGERY  1995   COLONOSCOPY WITH PROPOFOL N/A 12/30/2018   Procedure: COLONOSCOPY WITH PROPOFOL;  Surgeon: Danie Binder, MD;  [facility];  Service: Endoscopy;  Laterality: N/A;  12:30pm   ESOPHAGOGASTRODUODENOSCOPY (EGD) WITH PROPOFOL N/A 12/30/2018   Procedure: ESOPHAGOGASTRODUODENOSCOPY (EGD) WITH PROPOFOL;  Surgeon: Danie Binder, MD;  [facility];  Service: Endoscopy;  Laterality: N/A;   IR IMAGING GUIDED PORT INSERTION  09/04/2019   POLYPECTOMY  12/30/2018   Procedure: POLYPECTOMY;  Surgeon: Danie Binder, MD;  [facility];  Service: Endoscopy;;  colon   THORACENTESIS Right 08/08/2019   Procedure: Thoracentesis;  Surgeon: [name], MD;  Location: [facility];  Service: Pulmonary;  Laterality: Right;   VIDEO BRONCHOSCOPY Right 08/08/2019   Procedure: Video Bronchoscopy with Erbe Cryo Biopsy of Right mainstem;  Surgeon: [name], MD;  Location: [facility] OR;  Service: Pulmonary;  Laterality: Right;     reports that he quit  smoking about 27 years ago. His smoking use included cigarettes. He has a 25.00 pack-year smoking history. He has never used smokeless tobacco. He reports current alcohol use. He reports that he does not use drugs.  Allergies  Allergen Reactions   Chlorhexidine    Aleve [Naproxen Sodium] Hives and Rash    Family History  Problem Relation Age of Onset   Alcoholism Father     CAD Brother    Diabetes Brother    Colon cancer Neg Hx    Colon polyps Neg Hx    Gastric cancer Neg Hx      Prior to Admission medications   Medication Sig Start Date End Date Taking? Authorizing Provider  acetaminophen (TYLENOL) 325 MG tablet Take 2 tablets (650 mg total) by mouth every 6 (six) hours as needed for mild pain, fever or headache. 10/28/19  Yes Emokpae, Courage, MD  albuterol (VENTOLIN HFA) 108 (90 Base) MCG/ACT inhaler Inhale 2 puffs into the lungs every 6 (six) hours as needed for wheezing or shortness of breath.   Yes [provider]  diltiazem (CARDIZEM CD) 360 MG 24 hr capsule Take 1 capsule (360 mg total) by mouth daily. 10/29/19  Yes Emokpae, Courage, MD  doxycycline (MONODOX) 50 MG capsule Take 50 mg by mouth 2 (two) times daily as needed (rosacea flare ups.).   Yes [provider]  ferrous sulfate 325 (65 FE) MG tablet Take 325 mg by mouth daily with supper.   Yes [provider]  FLOVENT HFA 110 MCG/ACT inhaler Inhale 1 puff into the lungs 2 (two) times daily.  06/20/19  Yes [provider]  folic acid (FOLVITE) 1 MG tablet Take 1 mg by mouth daily.   Yes [provider]  gabapentin (NEURONTIN) 300 MG capsule Take 600 mg by mouth at bedtime.   Yes [provider]  lidocaine-prilocaine (EMLA) cream Apply to the Port-A-Cath site 30 minutes before treatment 08/26/19  Yes Curt Bears, MD  loratadine (CLARITIN) 10 MG tablet Take 10 mg by mouth daily.   Yes [provider]  metFORMIN (GLUCOPHAGE-XR) 500 MG 24 hr tablet Take 500-1,000 mg by mouth See admin instructions. Take 2 tablets (1000 mg) by mouth in the morning & 1 tablet (500 mg) by mouth at night. 10/15/18  Yes [provider]  metoprolol tartrate (LOPRESSOR) 25 MG tablet Take 1 tablet (25 mg total) by mouth 2 (two) times daily. 10/28/19  Yes Emokpae, Courage, MD  metroNIDAZOLE (METROCREAM) 0.75 % cream Apply 1 application topically 2 (two)  times daily as needed (facial rosacea).    Yes [provider]  mometasone (ELOCON) 0.1 % cream Apply 1 application topically 2 (two) times daily as needed (psoriasis).    Yes [provider]  Omega-3 Fatty Acids (FISH OIL) 1000 MG CAPS Take 1,000 mg by mouth daily.    Yes [provider]  pantoprazole (PROTONIX) 40 MG tablet 1 po 30 mins prior to first meal 12/30/18  Yes Fields, Marga Melnick, MD  prochlorperazine (COMPAZINE) 10 MG tablet Take 1 tablet (10 mg total) by mouth every 6 (six) hours as needed for nausea or vomiting. 08/26/19  Yes Curt Bears, MD  senna-docusate (SENOKOT-S) 8.6-50 MG tablet Take 2 tablets by mouth at bedtime. 10/28/19 10/27/20 Yes Emokpae, Courage, MD  traMADol (ULTRAM) 50 MG tablet Take 50 mg by mouth every 4 (four) hours as needed for moderate pain. for pain 08/19/18  Yes [provider]  triamcinolone lotion (KENALOG) 0.1 % Apply 1 application  topically 4 (four) times daily. 09/15/19  Yes Tanner, Lyndon Code., PA-C  umeclidinium-vilanterol (ANORO ELLIPTA) 62.5-25 MCG/INH AEPB Inhale 1 puff into the lungs daily. 08/13/19  Yes Mariel Aloe, MD    Physical Exam: Vitals:   11/08/19 2108  BP: (!) 141/87  Pulse: (!) 110  Resp: 15  Temp: 98.1 F (36.7 C)  SpO2: 99%    Constitutional: NAD, calm  Eyes: PERTLA, lids and conjunctivae normal ENMT: Mucous membranes are moist. Posterior pharynx clear of any exudate or lesions.   Neck: normal, supple, no masses, no thyromegaly Respiratory: Diminished on right more than left. No wheezing. No accessory muscle use.  Cardiovascular: S1 & S2 heard, regular rate and rhythm. No extremity edema.   Abdomen: No distension, no tenderness, soft. Bowel sounds active.  Musculoskeletal: no clubbing / cyanosis. No joint deformity upper and lower extremities.   Skin: Erythematous plaques involving distal LEs with silvery scale. Warm, dry, well-perfused. Neurologic: No gross facial asymmetry. Sensation intact.  Moving all extremities.  Psychiatric: Alert and oriented to person, place, and situation. Pleasant and cooperative.    Labs and Imaging on Admission: I have personally reviewed following labs and imaging studies  CBC: Recent Labs  Lab 11/05/19 0942 11/08/19 2023 11/08/19 2107  WBC 8.8 8.7  --   NEUTROABS 7.1 6.8  --   HGB 7.8* 9.7* 10.2*  HCT 25.9* 32.6* 30.0*  MCV 94.2 92.1  --   PLT 288 346  --    Basic Metabolic Panel: Recent Labs  Lab 11/05/19 0942 11/08/19 2023 11/08/19 2107  NA 140 139 139  K 4.3 3.7 3.7  CL 100 97* 98  CO2 28 26  --   GLUCOSE 154* 188* 193*  BUN 14 16 14   CREATININE 0.76 0.90 0.80  CALCIUM 9.0 8.1*  --    GFR: CrCl cannot be calculated (Unknown ideal weight.). Liver Function Tests: Recent Labs  Lab 11/05/19 0942 11/08/19 2023  AST 17 22  ALT 20 23  ALKPHOS 67 68  BILITOT 0.4 0.6  PROT 8.1 8.4*  ALBUMIN 1.7* 2.2*   No results for input(s): LIPASE, AMYLASE in the last 168 hours. No results for input(s): AMMONIA in the last 168 hours. Coagulation Profile: Recent Labs  Lab 11/08/19 2023  INR 1.1   Cardiac Enzymes: No results for input(s): CKTOTAL, CKMB, CKMBINDEX, TROPONINI in the last 168 hours. BNP (last 3 results) No results for input(s): PROBNP in the last 8760 hours. HbA1C: No results for input(s): HGBA1C in the last 72 hours. CBG: No results for input(s): GLUCAP in the last 168 hours. Lipid Profile: No results for input(s): CHOL, HDL, LDLCALC, TRIG, CHOLHDL, LDLDIRECT in the last 72 hours. Thyroid Function Tests: No results for input(s): TSH, T4TOTAL, FREET4, T3FREE, THYROIDAB in the last 72 hours. Anemia Panel: No results for input(s): VITAMINB12, FOLATE, FERRITIN, TIBC, IRON, RETICCTPCT in the last 72 hours. Urine analysis:    Component Value Date/Time   COLORURINE YELLOW 11/08/2019 2024   APPEARANCEUR CLOUDY (A) 11/08/2019 2024   LABSPEC 1.023 11/08/2019 2024   PHURINE 5.0 11/08/2019 2024   GLUCOSEU NEGATIVE  11/08/2019 2024   HGBUR MODERATE (A) 11/08/2019 2024   BILIRUBINUR NEGATIVE 11/08/2019 2024   KETONESUR NEGATIVE 11/08/2019 2024   PROTEINUR 100 (A) 11/08/2019 2024   NITRITE NEGATIVE 11/08/2019 2024   LEUKOCYTESUR NEGATIVE 11/08/2019 2024   Sepsis Labs: @LABRCNTIP (procalcitonin:4,lacticidven:4) ) Recent Results (from the past 240 hour(s))  SARS CORONAVIRUS 2 (TAT 6-24 HRS) Nasopharyngeal Nasopharyngeal Swab  Status: None   Collection Time: 11/08/19  1:32 PM   Specimen: Nasopharyngeal Swab  Result Value Ref Range Status   SARS Coronavirus 2 NEGATIVE NEGATIVE Final    Comment: (NOTE) SARS-CoV-2 target nucleic acids are NOT DETECTED. The SARS-CoV-2 RNA is generally detectable in upper and lower respiratory specimens during the acute phase of infection. Negative results do not preclude SARS-CoV-2 infection, do not rule out co-infections with other pathogens, and should not be used as the sole basis for treatment or other patient management decisions. Negative results must be combined with clinical observations, patient history, and epidemiological information. The expected result is Negative. Fact Sheet for Patients: SugarRoll.be Fact Sheet for Healthcare Providers: https://www.woods-mathews.com/ This test is not yet approved or cleared by the [place] FDA and  has been authorized for detection and/or diagnosis of SARS-CoV-2 by FDA under an Emergency Use Authorization (EUA). This EUA will remain  in effect (meaning this test can be used) for the duration of the COVID-19 declaration under Section 56 4(b)(1) of the Act, 21 U.S.C. section 360bbb-3(b)(1), unless the authorization is terminated or revoked sooner. Performed at [facility], [place] [address], [place], [place] [address]      Radiological Exams on Admission: DG Chest 2 View  Result Date: 11/08/2019 CLINICAL DATA:  Fever, cough, chills, stage IV lung cancer EXAM:  CHEST - 2 VIEW COMPARISON:  10/19/2019, 11/07/2019 FINDINGS: Single frontal view of the chest demonstrates stable right chest wall port. Loculated right pleural effusion unchanged, with compressive atelectasis at the right lung base. No new airspace disease. No pneumothorax. Cardiac silhouette is stable. No acute bony abnormalities. IMPRESSION: 1. Stable loculated right pleural effusion with compressive atelectasis at the right lung base. Electronically Signed   By: Randa Ngo M.D.   On: 11/08/2019 21:16   CT Chest W Contrast  Result Date: 11/07/2019 CLINICAL DATA:  Non-small cell lung cancer staging, reportedly post chemo and radiotherapy. Currently on immunotherapy as well. EXAM: CT CHEST, ABDOMEN, AND PELVIS WITH CONTRAST TECHNIQUE: Multidetector CT imaging of the chest, abdomen and pelvis was performed following the standard protocol during bolus administration of intravenous contrast. CONTRAST:  157mL OMNIPAQUE IOHEXOL 300 MG/ML  SOLN COMPARISON:  08/11/2019 FINDINGS: CT CHEST FINDINGS Cardiovascular: RIGHT-sided Port-A-Cath terminates at the caval to atrial junction. Heart size is normal. There is mild pericardial nodularity suggested along the RIGHT heart border on image 45 series 2 with small pericardial effusion and pre pericardial lymph nodes. Pericardial effusion is diminished. Central pulmonary vasculature is unremarkable on venous phase. RIGHT hilum remains encased by soft tissue. Mediastinum/Nodes: No thoracic inlet adenopathy. No axillary lymphadenopathy. Soft tissue in case mint of RIGHT bronchial structures is improved as is soft tissue within the RIGHT mainstem bronchus and RIGHT lower lobe bronchus. Scattered small mediastinal lymph nodes. Diminished subcarinal nodal tissue largest measuring 1.6 cm short axis previously approximately 3.2 cm short axis. No LEFT hilar nodal tissue. Small lymph node adjacent to the distal esophagus and medial to markedly improved masslike area in the RIGHT  lower lobe measures 1 cm (image 44, series 2) Lungs/Pleura: Increased size of loculated pleural fluid in the RIGHT chest. Rim of enhancement has developed. Pleural fluid in the RIGHT chest measuring 17 x 9 cm on image 40 of series 2. Diminished size of the dominant masslike area in the RIGHT lower lobe. In an area that may simply represent treated tumor in collapsed lung measuring 6.2 x 4.3 cm corresponding to the necrotic appearing mass in the inferior RIGHT  chest on the previous CT from August 06, 2019 that measured 10 x 8.5 cm. Margins are no longer bulging and rounded. Volume loss in the setting of enlarging pleural fluid in the RIGHT lower lobe. Diminished nodularity throughout the RIGHT upper lobe. New area of nodularity in the LEFT lung base (image 90, series 3) 1.8 cm x 0.8 cm. No pleural effusion in the LEFT chest. Motion limited assessment at the LEFT lung base. Musculoskeletal: No sign of chest wall mass. See below for full musculoskeletal details. CT ABDOMEN PELVIS FINDINGS Hepatobiliary: No focal suspicious hepatic lesion. No biliary ductal dilation. No pericholecystic stranding. Portal vein is patent. Pancreas: Pancreas is normal without ductal dilation, inflammation or pancreatic lesion. Spleen: Spleen normal size without focal lesion. Adrenals/Urinary Tract: Adrenal glands are normal. Kidneys enhance symmetrically without sign of focal lesion or evidence of hydronephrosis. Stomach/Bowel: No acute gastrointestinal process. Colonic diverticulosis without sign of diverticulitis. Vascular/Lymphatic: Calcified atheromatous plaque of the abdominal aorta. No aneurysm. No adenopathy in the retroperitoneum. No retrocrural lymphadenopathy. No pelvic lymphadenopathy. Reproductive: Prostate unremarkable by CT. Other: No abdominal wall hernia or abnormality. No abdominopelvic ascites. Musculoskeletal: Spinal degenerative changes. No acute or destructive bone process. IMPRESSION: 1. Increased size of loculated  pleural fluid in the RIGHT chest with new rim of enhancement. Findings may represent enlarging reactive effusion following radiation. Correlate with any clinical signs of infection as empyema could have a similar appearance. Given size of effusion would also correlate with any worsening shortness of breath. No gas present within the fluid. 2. Volume loss in the RIGHT chest increasing as the masslike area in the medial RIGHT chest as diminished in size as described. Also with resolution of upper lobe nodules. 3. Motion limited assessment of lung parenchyma but with new area of nodularity at the LEFT lung base, consider close attention on short interval follow-up. 4. Diminished size of mediastinal lymph nodes within the subcarinal region in particular. 5. No evidence of metastatic disease in the abdomen or pelvis. 6. Aortic atherosclerosis. Aortic Atherosclerosis (ICD10-I70.0). Electronically Signed   By: Zetta Bills M.D.   On: 11/07/2019 15:55   CT Abdomen Pelvis W Contrast  Result Date: 11/07/2019 CLINICAL DATA:  Non-small cell lung cancer staging, reportedly post chemo and radiotherapy. Currently on immunotherapy as well. EXAM: CT CHEST, ABDOMEN, AND PELVIS WITH CONTRAST TECHNIQUE: Multidetector CT imaging of the chest, abdomen and pelvis was performed following the standard protocol during bolus administration of intravenous contrast. CONTRAST:  143mL OMNIPAQUE IOHEXOL 300 MG/ML  SOLN COMPARISON:  08/11/2019 FINDINGS: CT CHEST FINDINGS Cardiovascular: RIGHT-sided Port-A-Cath terminates at the caval to atrial junction. Heart size is normal. There is mild pericardial nodularity suggested along the RIGHT heart border on image 45 series 2 with small pericardial effusion and pre pericardial lymph nodes. Pericardial effusion is diminished. Central pulmonary vasculature is unremarkable on venous phase. RIGHT hilum remains encased by soft tissue. Mediastinum/Nodes: No thoracic inlet adenopathy. No axillary  lymphadenopathy. Soft tissue in case mint of RIGHT bronchial structures is improved as is soft tissue within the RIGHT mainstem bronchus and RIGHT lower lobe bronchus. Scattered small mediastinal lymph nodes. Diminished subcarinal nodal tissue largest measuring 1.6 cm short axis previously approximately 3.2 cm short axis. No LEFT hilar nodal tissue. Small lymph node adjacent to the distal esophagus and medial to markedly improved masslike area in the RIGHT lower lobe measures 1 cm (image 44, series 2) Lungs/Pleura: Increased size of loculated pleural fluid in the RIGHT chest. Rim of enhancement has developed. Pleural fluid in  the RIGHT chest measuring 17 x 9 cm on image 40 of series 2. Diminished size of the dominant masslike area in the RIGHT lower lobe. In an area that may simply represent treated tumor in collapsed lung measuring 6.2 x 4.3 cm corresponding to the necrotic appearing mass in the inferior RIGHT chest on the previous CT from August 06, 2019 that measured 10 x 8.5 cm. Margins are no longer bulging and rounded. Volume loss in the setting of enlarging pleural fluid in the RIGHT lower lobe. Diminished nodularity throughout the RIGHT upper lobe. New area of nodularity in the LEFT lung base (image 90, series 3) 1.8 cm x 0.8 cm. No pleural effusion in the LEFT chest. Motion limited assessment at the LEFT lung base. Musculoskeletal: No sign of chest wall mass. See below for full musculoskeletal details. CT ABDOMEN PELVIS FINDINGS Hepatobiliary: No focal suspicious hepatic lesion. No biliary ductal dilation. No pericholecystic stranding. Portal vein is patent. Pancreas: Pancreas is normal without ductal dilation, inflammation or pancreatic lesion. Spleen: Spleen normal size without focal lesion. Adrenals/Urinary Tract: Adrenal glands are normal. Kidneys enhance symmetrically without sign of focal lesion or evidence of hydronephrosis. Stomach/Bowel: No acute gastrointestinal process. Colonic diverticulosis  without sign of diverticulitis. Vascular/Lymphatic: Calcified atheromatous plaque of the abdominal aorta. No aneurysm. No adenopathy in the retroperitoneum. No retrocrural lymphadenopathy. No pelvic lymphadenopathy. Reproductive: Prostate unremarkable by CT. Other: No abdominal wall hernia or abnormality. No abdominopelvic ascites. Musculoskeletal: Spinal degenerative changes. No acute or destructive bone process. IMPRESSION: 1. Increased size of loculated pleural fluid in the RIGHT chest with new rim of enhancement. Findings may represent enlarging reactive effusion following radiation. Correlate with any clinical signs of infection as empyema could have a similar appearance. Given size of effusion would also correlate with any worsening shortness of breath. No gas present within the fluid. 2. Volume loss in the RIGHT chest increasing as the masslike area in the medial RIGHT chest as diminished in size as described. Also with resolution of upper lobe nodules. 3. Motion limited assessment of lung parenchyma but with new area of nodularity at the LEFT lung base, consider close attention on short interval follow-up. 4. Diminished size of mediastinal lymph nodes within the subcarinal region in particular. 5. No evidence of metastatic disease in the abdomen or pelvis. 6. Aortic atherosclerosis. Aortic Atherosclerosis (ICD10-I70.0). Electronically Signed   By: Zetta Bills M.D.   On: 11/07/2019 15:55    EKG: Independently reviewed. Junctional tachycardia, rate 144.   Assessment/Plan   1. Right pleural effusion, suspected parapneumonic  - Presents with fevers and cough after recent outpatient CT concerning for increase in right pleural effusion with question of possible empyema  - He was cultured in ED and started on broad-spectrum antibiotics  - Consult IR for thoracentesis, check sputum culture and strep pneumo and legionella antigens, check MRSA pcr, and continue antibiotics with cefepime, vancomycin, and  Flagyl    2. Lung cancer  - Following with Dr. Julien Nordmann of oncology and undergoing systemic chemotherapy  - Oncologist added to treatment team as notification of current admission    3. COPD; chronic hypoxic respiratory failur e - No wheezing on admission, stable on his usual 2 Lpm supplemental O2  - Continue ICS, LABA/LAMA, and as-needed albuterol    4. Type II DM  - A1c was 7.0% in March 2021  - Check CBGs and use a low-intensity SSI for now   5. Paroxysmal atrial fibrillation  - In sinus rhythm at time of admission  -  Not anticoagulated d/t transfusion-dependent anemia  - Continue diltiazem and metoprolol as tolerated    6. Anemia  - Stable, no active bleeding, monitor    DVT prophylaxis: SCDs  Code Status: DNR  Family Communication: Wife updated at bedside Disposition Plan:  Patient is from: Home   Anticipated d/c is to: TBD  Anticipated d/c date is: 11/10/19 Patient currently: Pending thoracentesis, requiring IV antibiotics  Consults called: None  Admission status: Inpatient     Vianne Bulls, MD [facility] Pager: See [email or web address]  If 7AM-7PM, please contact the daytime attending [email or web address]  11/08/2019, 10:50 PM

## 2019-11-08 NOTE — ED Triage Notes (Signed)
Patient presents with fever, cough and chills. Patient has IV lung cancer. Patient has a CT done a few days ago, and patient's oncologist stated there was possible signs of infection, and if patient had symptoms to be seen. Patient began having fever and chills this evening.

## 2019-11-08 NOTE — ED Provider Notes (Signed)
[facility] DEPT Provider Note   CSN: [id number] Arrival date & time: 11/08/19  1958     History Chief Complaint  Patient presents with  . Fever  . Cough  . Lung Cancer    Jeremy [name] is a 74 y.o. male hx of COPD, DM, HTN, stage IV lung cancer, here presenting with fever and cough.  Patient was just recently admitted for sepsis and was thought to be septic from pneumonia. Patient had outpatient CT chest abdomen pelvis to assess his cancer status yesterday and was found to have a new pleural effusion is likely empyema.  Patient had low-grade temperature 99 yesterday.  This afternoon, he developed chills and had a fever 102 at home.  He also has some nonproductive cough.  Patient is on 2 L nasal cannula at baseline.  He also had a Covid test done this morning that was negative.  Patient also had his Covid shots.  The history is provided by the patient.       Past Medical History:  Diagnosis Date  . Cancer ([place])    mass right lung  . COPD (chronic obstructive pulmonary disease) (H[facility])    QUIT SMOKING 30 YRS AGO  . Diabetes ([place]) 2018  . HTN (hypertension)   . Psoriatic arthritis ([place]) 2015    Patient Active Problem List   Diagnosis Date Noted  . Port-A-Cath in place 11/05/2019  . Pleural effusion 11/05/2019  . Sepsis due to undetermined organism ([place]) 10/19/2019  . Thrombocytopenia ([place]) 10/19/2019  . Failure to thrive in adult 10/16/2019  . Generalized weakness 10/16/2019  . Drug-induced skin rash 09/23/2019  . 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 Encounter for antineoplastic chemotherapy 08/26/2019  . Encounter for antineoplastic immunotherapy 08/26/2019  . Goals of care, counseling/discussion 08/26/2019  . Atrial fibrillation with RVR ([place])   . Protein-calorie malnutrition, severe 08/07/2019  . Hemoptysis 08/06/2019  . Mass of right lung 08/06/2019  . Former smoker 08/06/2019  . Lung mass 08/06/2019  .  Postobstructive pneumonia 08/06/2019  . COPD (chronic obstructive pulmonary disease) ([place])   . HTN (hypertension)   . Abnormal weight loss   . Leukocytosis   . Acute and chronic respiratory failure with hypoxia ([place])   . Anemia in neoplastic disease   . Hyperglycemia   . Heme + stool 11/06/2018  . Type 2 diabetes mellitus ([place]) 2018    Past Surgical History:  Procedure Laterality Date  . BACK SURGERY  1990  . BIOPSY  12/30/2018   Procedure: BIOPSY;  Surgeon: Danie Binder, MD;  Location: [facility];  Service: Endoscopy;;  gastric  . CATARACT EXTRACTION Bilateral   . CERVICAL SPINE SURGERY  1995  . COLONOSCOPY WITH PROPOFOL N/A 12/30/2018   Procedure: COLONOSCOPY WITH PROPOFOL;  Surgeon: Danie Binder, MD;  Location: [facility];  Service: Endoscopy;  Laterality: N/A;  12:30pm  . ESOPHAGOGASTRODUODENOSCOPY (EGD) WITH PROPOFOL N/A 12/30/2018   Procedure: ESOPHAGOGASTRODUODENOSCOPY (EGD) WITH PROPOFOL;  Surgeon: Danie Binder, MD;  Location: [facility];  Service: Endoscopy;  Laterality: N/A;  . IR IMAGING GUIDED PORT INSERTION  09/04/2019  . POLYPECTOMY  12/30/2018   Procedure: POLYPECTOMY;  Surgeon: Danie Binder, MD;  Location: [facility];  Service: Endoscopy;;  colon  . THORACENTESIS Right 08/08/2019   Procedure: Thoracentesis;  Surgeon: [name], MD;  Location: [facility];  Service: Pulmonary;  Laterality: Right;  Marland Kitchen VIDEO BRONCHOSCOPY Right 08/08/2019  Procedure: Video Bronchoscopy with Erbe Cryo Biopsy of Right mainstem;  Surgeon: [name], MD;  Location: [facility] OR;  Service: Pulmonary;  Laterality: Right;       Family History  Problem Relation Age of Onset  . Alcoholism Father   . CAD Brother   . Diabetes Brother   . Colon cancer Neg Hx   . Colon polyps Neg Hx   . Gastric cancer Neg Hx     Social History   Tobacco Use  . Smoking status: Former Smoker    Packs/day: 0.50    Years: 50.00    Pack years: 25.00    Types: Cigarettes    Quit date:  12/26/1991    Years since quitting: 27.8  . Smokeless tobacco: Never Used  Substance Use Topics  . Alcohol use: Yes    Comment: occ beer  . Drug use: Never    Home Medications Prior to Admission medications   Medication Sig Start Date End Date Taking? Authorizing Provider  acetaminophen (TYLENOL) 325 MG tablet Take 2 tablets (650 mg total) by mouth every 6 (six) hours as needed for mild pain, fever or headache. 10/28/19  Yes Emokpae, Courage, MD  albuterol (VENTOLIN HFA) 108 (90 Base) MCG/ACT inhaler Inhale 2 puffs into the lungs every 6 (six) hours as needed for wheezing or shortness of breath.   Yes [provider]  diltiazem (CARDIZEM CD) 360 MG 24 hr capsule Take 1 capsule (360 mg total) by mouth daily. 10/29/19  Yes Emokpae, Courage, MD  doxycycline (MONODOX) 50 MG capsule Take 50 mg by mouth 2 (two) times daily as needed (rosacea flare ups.).   Yes [provider]  ferrous sulfate 325 (65 FE) MG tablet Take 325 mg by mouth daily with supper.   Yes [provider]  FLOVENT HFA 110 MCG/ACT inhaler Inhale 1 puff into the lungs 2 (two) times daily.  06/20/19  Yes [provider]  folic acid (FOLVITE) 1 MG tablet Take 1 mg by mouth daily.   Yes [provider]  gabapentin (NEURONTIN) 300 MG capsule Take 600 mg by mouth at bedtime.   Yes [provider]  lidocaine-prilocaine (EMLA) cream Apply to the Port-A-Cath site 30 minutes before treatment 08/26/19  Yes Curt Bears, MD  loratadine (CLARITIN) 10 MG tablet Take 10 mg by mouth daily.   Yes [provider]  metFORMIN (GLUCOPHAGE-XR) 500 MG 24 hr tablet Take 500-1,000 mg by mouth See admin instructions. Take 2 tablets (1000 mg) by mouth in the morning & 1 tablet (500 mg) by mouth at night. 10/15/18  Yes [provider]  metoprolol tartrate (LOPRESSOR) 25 MG tablet Take 1 tablet (25 mg total) by mouth 2 (two) times daily. 10/28/19  Yes Emokpae, Courage, MD  metroNIDAZOLE  (METROCREAM) 0.75 % cream Apply 1 application topically 2 (two) times daily as needed (facial rosacea).    Yes [provider]  mometasone (ELOCON) 0.1 % cream Apply 1 application topically 2 (two) times daily as needed (psoriasis).    Yes [provider]  Omega-3 Fatty Acids (FISH OIL) 1000 MG CAPS Take 1,000 mg by mouth daily.    Yes [provider]  pantoprazole (PROTONIX) 40 MG tablet 1 po 30 mins prior to first meal 12/30/18  Yes Fields, Marga Melnick, MD  prochlorperazine (COMPAZINE) 10 MG tablet Take 1 tablet (10 mg total) by mouth every 6 (six) hours as needed for nausea or vomiting. 08/26/19  Yes Curt Bears, MD  senna-docusate (SENOKOT-S) 8.6-50  MG tablet Take 2 tablets by mouth at bedtime. 10/28/19 10/27/20 Yes Emokpae, Courage, MD  traMADol (ULTRAM) 50 MG tablet Take 50 mg by mouth every 4 (four) hours as needed for moderate pain. for pain 08/19/18  Yes [provider]  triamcinolone lotion (KENALOG) 0.1 % Apply 1 application topically 4 (four) times daily. 09/15/19  Yes Tanner, Lyndon Code., PA-C  umeclidinium-vilanterol (ANORO ELLIPTA) 62.5-25 MCG/INH AEPB Inhale 1 puff into the lungs daily. 08/13/19  Yes Mariel Aloe, MD    Allergies    Chlorhexidine and Aleve [naproxen sodium]  Review of Systems   Review of Systems  Constitutional: Positive for fever.  Respiratory: Positive for cough.   All other systems reviewed and are negative.   Physical Exam Updated Vital Signs BP (!) 141/87   Pulse (!) 110   Temp 98.1 F (36.7 C)   Resp 15   SpO2 99%   Physical Exam Vitals and nursing note reviewed.  Constitutional:      Appearance: Normal appearance.     Comments: Chronically ill   HENT:     Head: Normocephalic.     Nose: Nose normal.     Mouth/Throat:     Mouth: Mucous membranes are dry.  Eyes:     Extraocular Movements: Extraocular movements intact.     Pupils: Pupils are equal, round, and reactive to light.  Cardiovascular:     Rate and  Rhythm: Normal rate and regular rhythm.     Pulses: Normal pulses.  Pulmonary:     Comments: Crackles R base  Abdominal:     General: Abdomen is flat.     Palpations: Abdomen is soft.  Musculoskeletal:        General: Normal range of motion.     Cervical back: Normal range of motion and neck supple.  Skin:    General: Skin is warm.     Capillary Refill: Capillary refill takes less than 2 seconds.  Neurological:     General: No focal deficit present.     Mental Status: He is alert.  Psychiatric:        Mood and Affect: Mood normal.        Behavior: Behavior normal.     ED Results / Procedures / Treatments   Labs (all labs ordered are listed, but only abnormal results are displayed) Labs Reviewed  COMPREHENSIVE METABOLIC PANEL - Abnormal; Notable for the following components:      Result Value   Chloride 97 (*)    Glucose, Bld 188 (*)    Calcium 8.1 (*)    Total Protein 8.4 (*)    Albumin 2.2 (*)    Anion gap 16 (*)    All other components within normal limits  LACTIC ACID, PLASMA - Abnormal; Notable for the following components:   Lactic Acid, Venous 2.0 (*)    All other components within normal limits  CBC WITH DIFFERENTIAL/PLATELET - Abnormal; Notable for the following components:   RBC 3.54 (*)    Hemoglobin 9.7 (*)    HCT 32.6 (*)    MCHC 29.8 (*)    RDW 17.9 (*)    Abs Immature Granulocytes 0.22 (*)    All other components within normal limits  URINALYSIS, ROUTINE W REFLEX MICROSCOPIC - Abnormal; Notable for the following components:   APPearance CLOUDY (*)    Hgb urine dipstick MODERATE (*)    Protein, ur 100 (*)    Bacteria, UA RARE (*)    All other components within  normal limits  I-STAT CHEM 8, ED - Abnormal; Notable for the following components:   Glucose, Bld 193 (*)    Calcium, Ion 1.06 (*)    Hemoglobin 10.2 (*)    HCT 30.0 (*)    All other components within normal limits  CULTURE, BLOOD (ROUTINE X 2)  CULTURE, BLOOD (ROUTINE X 2)  URINE CULTURE   LACTIC ACID, PLASMA  PROTIME-INR    EKG None  Radiology DG Chest 2 View  Result Date: 11/08/2019 CLINICAL DATA:  Fever, cough, chills, stage IV lung cancer EXAM: CHEST - 2 VIEW COMPARISON:  10/19/2019, 11/07/2019 FINDINGS: Single frontal view of the chest demonstrates stable right chest wall port. Loculated right pleural effusion unchanged, with compressive atelectasis at the right lung base. No new airspace disease. No pneumothorax. Cardiac silhouette is stable. No acute bony abnormalities. IMPRESSION: 1. Stable loculated right pleural effusion with compressive atelectasis at the right lung base. Electronically Signed   By: Randa Ngo M.D.   On: 11/08/2019 21:16   CT Chest W Contrast  Result Date: 11/07/2019 CLINICAL DATA:  Non-small cell lung cancer staging, reportedly post chemo and radiotherapy. Currently on immunotherapy as well. EXAM: CT CHEST, ABDOMEN, AND PELVIS WITH CONTRAST TECHNIQUE: Multidetector CT imaging of the chest, abdomen and pelvis was performed following the standard protocol during bolus administration of intravenous contrast. CONTRAST:  187mL OMNIPAQUE IOHEXOL 300 MG/ML  SOLN COMPARISON:  08/11/2019 FINDINGS: CT CHEST FINDINGS Cardiovascular: RIGHT-sided Port-A-Cath terminates at the caval to atrial junction. Heart size is normal. There is mild pericardial nodularity suggested along the RIGHT heart border on image 45 series 2 with small pericardial effusion and pre pericardial lymph nodes. Pericardial effusion is diminished. Central pulmonary vasculature is unremarkable on venous phase. RIGHT hilum remains encased by soft tissue. Mediastinum/Nodes: No thoracic inlet adenopathy. No axillary lymphadenopathy. Soft tissue in case mint of RIGHT bronchial structures is improved as is soft tissue within the RIGHT mainstem bronchus and RIGHT lower lobe bronchus. Scattered small mediastinal lymph nodes. Diminished subcarinal nodal tissue largest measuring 1.6 cm short axis previously  approximately 3.2 cm short axis. No LEFT hilar nodal tissue. Small lymph node adjacent to the distal esophagus and medial to markedly improved masslike area in the RIGHT lower lobe measures 1 cm (image 44, series 2) Lungs/Pleura: Increased size of loculated pleural fluid in the RIGHT chest. Rim of enhancement has developed. Pleural fluid in the RIGHT chest measuring 17 x 9 cm on image 40 of series 2. Diminished size of the dominant masslike area in the RIGHT lower lobe. In an area that may simply represent treated tumor in collapsed lung measuring 6.2 x 4.3 cm corresponding to the necrotic appearing mass in the inferior RIGHT chest on the previous CT from August 06, 2019 that measured 10 x 8.5 cm. Margins are no longer bulging and rounded. Volume loss in the setting of enlarging pleural fluid in the RIGHT lower lobe. Diminished nodularity throughout the RIGHT upper lobe. New area of nodularity in the LEFT lung base (image 90, series 3) 1.8 cm x 0.8 cm. No pleural effusion in the LEFT chest. Motion limited assessment at the LEFT lung base. Musculoskeletal: No sign of chest wall mass. See below for full musculoskeletal details. CT ABDOMEN PELVIS FINDINGS Hepatobiliary: No focal suspicious hepatic lesion. No biliary ductal dilation. No pericholecystic stranding. Portal vein is patent. Pancreas: Pancreas is normal without ductal dilation, inflammation or pancreatic lesion. Spleen: Spleen normal size without focal lesion. Adrenals/Urinary Tract: Adrenal glands are normal. Kidneys enhance  symmetrically without sign of focal lesion or evidence of hydronephrosis. Stomach/Bowel: No acute gastrointestinal process. Colonic diverticulosis without sign of diverticulitis. Vascular/Lymphatic: Calcified atheromatous plaque of the abdominal aorta. No aneurysm. No adenopathy in the retroperitoneum. No retrocrural lymphadenopathy. No pelvic lymphadenopathy. Reproductive: Prostate unremarkable by CT. Other: No abdominal wall hernia or  abnormality. No abdominopelvic ascites. Musculoskeletal: Spinal degenerative changes. No acute or destructive bone process. IMPRESSION: 1. Increased size of loculated pleural fluid in the RIGHT chest with new rim of enhancement. Findings may represent enlarging reactive effusion following radiation. Correlate with any clinical signs of infection as empyema could have a similar appearance. Given size of effusion would also correlate with any worsening shortness of breath. No gas present within the fluid. 2. Volume loss in the RIGHT chest increasing as the masslike area in the medial RIGHT chest as diminished in size as described. Also with resolution of upper lobe nodules. 3. Motion limited assessment of lung parenchyma but with new area of nodularity at the LEFT lung base, consider close attention on short interval follow-up. 4. Diminished size of mediastinal lymph nodes within the subcarinal region in particular. 5. No evidence of metastatic disease in the abdomen or pelvis. 6. Aortic atherosclerosis. Aortic Atherosclerosis (ICD10-I70.0). Electronically Signed   By: Zetta Bills M.D.   On: 11/07/2019 15:55   CT Abdomen Pelvis W Contrast  Result Date: 11/07/2019 CLINICAL DATA:  Non-small cell lung cancer staging, reportedly post chemo and radiotherapy. Currently on immunotherapy as well. EXAM: CT CHEST, ABDOMEN, AND PELVIS WITH CONTRAST TECHNIQUE: Multidetector CT imaging of the chest, abdomen and pelvis was performed following the standard protocol during bolus administration of intravenous contrast. CONTRAST:  152mL OMNIPAQUE IOHEXOL 300 MG/ML  SOLN COMPARISON:  08/11/2019 FINDINGS: CT CHEST FINDINGS Cardiovascular: RIGHT-sided Port-A-Cath terminates at the caval to atrial junction. Heart size is normal. There is mild pericardial nodularity suggested along the RIGHT heart border on image 45 series 2 with small pericardial effusion and pre pericardial lymph nodes. Pericardial effusion is diminished. Central  pulmonary vasculature is unremarkable on venous phase. RIGHT hilum remains encased by soft tissue. Mediastinum/Nodes: No thoracic inlet adenopathy. No axillary lymphadenopathy. Soft tissue in case mint of RIGHT bronchial structures is improved as is soft tissue within the RIGHT mainstem bronchus and RIGHT lower lobe bronchus. Scattered small mediastinal lymph nodes. Diminished subcarinal nodal tissue largest measuring 1.6 cm short axis previously approximately 3.2 cm short axis. No LEFT hilar nodal tissue. Small lymph node adjacent to the distal esophagus and medial to markedly improved masslike area in the RIGHT lower lobe measures 1 cm (image 44, series 2) Lungs/Pleura: Increased size of loculated pleural fluid in the RIGHT chest. Rim of enhancement has developed. Pleural fluid in the RIGHT chest measuring 17 x 9 cm on image 40 of series 2. Diminished size of the dominant masslike area in the RIGHT lower lobe. In an area that may simply represent treated tumor in collapsed lung measuring 6.2 x 4.3 cm corresponding to the necrotic appearing mass in the inferior RIGHT chest on the previous CT from August 06, 2019 that measured 10 x 8.5 cm. Margins are no longer bulging and rounded. Volume loss in the setting of enlarging pleural fluid in the RIGHT lower lobe. Diminished nodularity throughout the RIGHT upper lobe. New area of nodularity in the LEFT lung base (image 90, series 3) 1.8 cm x 0.8 cm. No pleural effusion in the LEFT chest. Motion limited assessment at the LEFT lung base. Musculoskeletal: No sign of chest wall mass.  See below for full musculoskeletal details. CT ABDOMEN PELVIS FINDINGS Hepatobiliary: No focal suspicious hepatic lesion. No biliary ductal dilation. No pericholecystic stranding. Portal vein is patent. Pancreas: Pancreas is normal without ductal dilation, inflammation or pancreatic lesion. Spleen: Spleen normal size without focal lesion. Adrenals/Urinary Tract: Adrenal glands are normal. Kidneys  enhance symmetrically without sign of focal lesion or evidence of hydronephrosis. Stomach/Bowel: No acute gastrointestinal process. Colonic diverticulosis without sign of diverticulitis. Vascular/Lymphatic: Calcified atheromatous plaque of the abdominal aorta. No aneurysm. No adenopathy in the retroperitoneum. No retrocrural lymphadenopathy. No pelvic lymphadenopathy. Reproductive: Prostate unremarkable by CT. Other: No abdominal wall hernia or abnormality. No abdominopelvic ascites. Musculoskeletal: Spinal degenerative changes. No acute or destructive bone process. IMPRESSION: 1. Increased size of loculated pleural fluid in the RIGHT chest with new rim of enhancement. Findings may represent enlarging reactive effusion following radiation. Correlate with any clinical signs of infection as empyema could have a similar appearance. Given size of effusion would also correlate with any worsening shortness of breath. No gas present within the fluid. 2. Volume loss in the RIGHT chest increasing as the masslike area in the medial RIGHT chest as diminished in size as described. Also with resolution of upper lobe nodules. 3. Motion limited assessment of lung parenchyma but with new area of nodularity at the LEFT lung base, consider close attention on short interval follow-up. 4. Diminished size of mediastinal lymph nodes within the subcarinal region in particular. 5. No evidence of metastatic disease in the abdomen or pelvis. 6. Aortic atherosclerosis. Aortic Atherosclerosis (ICD10-I70.0). Electronically Signed   By: Zetta Bills M.D.   On: 11/07/2019 15:55    Procedures Procedures (including critical care time)  CRITICAL CARE Performed by: Wandra Arthurs   Total critical care time: 30 minutes  Critical care time was exclusive of separately billable procedures and treating other patients.  Critical care was necessary to treat or prevent imminent or life-threatening deterioration.  Critical care was time spent  personally by me on the following activities: development of treatment plan with patient and/or surrogate as well as nursing, discussions with consultants, evaluation of patient's response to treatment, examination of patient, obtaining history from patient or surrogate, ordering and performing treatments and interventions, ordering and review of laboratory studies, ordering and review of radiographic studies, pulse oximetry and re-evaluation of patient's condition.   Medications Ordered in ED Medications  acetaminophen (TYLENOL) tablet 1,000 mg (has no administration in time range)  vancomycin (VANCOCIN) IVPB 1000 mg/200 mL premix (has no administration in time range)  sodium chloride 0.9 % bolus 2,000 mL (2,000 mLs Intravenous New Bag/Given 11/08/19 2113)  ceFEPIme (MAXIPIME) 2 g in sodium chloride 0.9 % 100 mL IVPB (2 g Intravenous New Bag/Given 11/08/19 2114)    ED Course  I have reviewed the triage vital signs and the nursing notes.  Pertinent labs & imaging results that were available during my care of the patient were reviewed by me and considered in my medical decision making (see chart for details).    MDM Rules/Calculators/A&P                      Jeremy [name] is a 74 y.o. male here with fever, cough. Febrile 102 this afternoon. Patient had a CT scan yesterday that showed complex empyema.  Patient is not currently on any antibiotics.  Patient is actually scheduled for thoracentesis on Monday.  Given fever 102, recent pneumonia, I am concerned that he may be septic.  We will  get CBC, CMP, lactate, blood cultures, repeat chest x-ray.  Will give broad-spectrum antibiotics and will need admission.  9:57 PM WBC is 8, lactate 2. CXR showed R pleural effusion. Given broad spectrum abx. Hospitalist to admit for sepsis from empyema.     Final Clinical Impression(s) / ED Diagnoses Final diagnoses:  None    Rx / DC Orders ED Discharge Orders    None       Drenda Freeze,  MD 11/08/19 2158

## 2019-11-09 ENCOUNTER — Inpatient Hospital Stay (HOSPITAL_COMMUNITY): Payer: Medicare Other

## 2019-11-09 DIAGNOSIS — Z515 Encounter for palliative care: Secondary | ICD-10-CM | POA: Insufficient documentation

## 2019-11-09 LAB — HEMOGLOBIN A1C
Hgb A1c MFr Bld: 7 % — ABNORMAL HIGH (ref 4.8–5.6)
Mean Plasma Glucose: 154.2 mg/dL

## 2019-11-09 LAB — LACTATE DEHYDROGENASE: LDH: 106 U/L (ref 98–192)

## 2019-11-09 LAB — STREP PNEUMONIAE URINARY ANTIGEN: Strep Pneumo Urinary Antigen: NEGATIVE

## 2019-11-09 LAB — GLUCOSE, CAPILLARY
Glucose-Capillary: 164 mg/dL — ABNORMAL HIGH (ref 70–99)
Glucose-Capillary: 127 mg/dL — ABNORMAL HIGH (ref 70–99)
Glucose-Capillary: 100 mg/dL — ABNORMAL HIGH (ref 70–99)

## 2019-11-09 LAB — HEPATIC FUNCTION PANEL
ALT: 20 U/L (ref 0–44)
AST: 16 U/L (ref 15–41)
Albumin: 1.9 g/dL — ABNORMAL LOW (ref 3.5–5.0)
Alkaline Phosphatase: 59 U/L (ref 38–126)
Bilirubin, Direct: 0.1 mg/dL (ref 0.0–0.2)
Indirect Bilirubin: 0.6 mg/dL (ref 0.3–0.9)
Total Bilirubin: 0.7 mg/dL (ref 0.3–1.2)
Total Protein: 7.5 g/dL (ref 6.5–8.1)

## 2019-11-09 LAB — CBG MONITORING, ED
Glucose-Capillary: 105 mg/dL — ABNORMAL HIGH (ref 70–99)
Glucose-Capillary: 100 mg/dL — ABNORMAL HIGH (ref 70–99)

## 2019-11-09 LAB — LACTIC ACID, PLASMA: Lactic Acid, Venous: 0.8 mmol/L (ref 0.5–1.9)

## 2019-11-09 LAB — MRSA PCR SCREENING: MRSA by PCR: NEGATIVE

## 2019-11-09 MED ORDER — SODIUM CHLORIDE 0.9% FLUSH
3.0000 mL | Freq: Two times a day (BID) | INTRAVENOUS | Status: DC
  Administered 2019-11-09 – 2019-11-10 (×3): 3 mL via INTRAVENOUS

## 2019-11-09 MED ORDER — MOMETASONE FUROATE 0.1 % EX CREA
1.0000 "application " | TOPICAL_CREAM | Freq: Two times a day (BID) | CUTANEOUS | Status: DC | PRN
  Filled 2019-11-09: qty 15

## 2019-11-09 MED ORDER — SODIUM CHLORIDE 0.9% FLUSH: 10.0000 mL | INTRAVENOUS | Status: DC | PRN

## 2019-11-09 MED ORDER — TRIAMCINOLONE ACETONIDE 0.1 % EX CREA
TOPICAL_CREAM | Freq: Every day | CUTANEOUS | Status: DC
  Administered 2019-11-13: 1 via TOPICAL
  Filled 2019-11-09: qty 15

## 2019-11-09 MED ORDER — FLUTICASONE PROPIONATE HFA 110 MCG/ACT IN AERO: 1.0000 | INHALATION_SPRAY | Freq: Two times a day (BID) | RESPIRATORY_TRACT | Status: DC

## 2019-11-09 MED ORDER — PANTOPRAZOLE SODIUM 40 MG PO TBEC
40.0000 mg | DELAYED_RELEASE_TABLET | Freq: Two times a day (BID) | ORAL | Status: DC
  Administered 2019-11-09 – 2019-11-18 (×19): 40 mg via ORAL
  Filled 2019-11-09 (×19): qty 1

## 2019-11-09 MED ORDER — BUDESONIDE 0.25 MG/2ML IN SUSP
0.2500 mg | Freq: Two times a day (BID) | RESPIRATORY_TRACT | Status: DC
  Administered 2019-11-09 – 2019-11-18 (×19): 0.25 mg via RESPIRATORY_TRACT
  Filled 2019-11-09 (×19): qty 2

## 2019-11-09 MED ORDER — SODIUM CHLORIDE 0.9 % IV SOLN
2.0000 g | Freq: Three times a day (TID) | INTRAVENOUS | Status: DC
  Administered 2019-11-09 – 2019-11-15 (×18): 2 g via INTRAVENOUS
  Filled 2019-11-09 (×20): qty 2

## 2019-11-09 MED ORDER — GABAPENTIN 300 MG PO CAPS
600.0000 mg | ORAL_CAPSULE | Freq: Every day | ORAL | Status: DC
  Administered 2019-11-09 – 2019-11-17 (×9): 600 mg via ORAL
  Filled 2019-11-09 (×9): qty 2

## 2019-11-09 MED ORDER — DILTIAZEM HCL ER COATED BEADS 180 MG PO CP24
360.0000 mg | ORAL_CAPSULE | Freq: Every day | ORAL | Status: DC
  Administered 2019-11-09 – 2019-11-17 (×9): 360 mg via ORAL
  Filled 2019-11-09 (×9): qty 2

## 2019-11-09 MED ORDER — ENSURE ENLIVE PO LIQD
237.0000 mL | Freq: Two times a day (BID) | ORAL | Status: DC
  Administered 2019-11-09 – 2019-11-18 (×13): 237 mL via ORAL
  Filled 2019-11-09: qty 237

## 2019-11-09 MED ORDER — SODIUM CHLORIDE 0.9% FLUSH: 3.0000 mL | INTRAVENOUS | Status: DC | PRN

## 2019-11-09 MED ORDER — SENNOSIDES-DOCUSATE SODIUM 8.6-50 MG PO TABS
2.0000 | ORAL_TABLET | Freq: Every day | ORAL | Status: DC
  Administered 2019-11-09 – 2019-11-17 (×8): 2 via ORAL
  Filled 2019-11-09 (×8): qty 2

## 2019-11-09 MED ORDER — FOLIC ACID 1 MG PO TABS
1.0000 mg | ORAL_TABLET | Freq: Every day | ORAL | Status: DC
  Administered 2019-11-09 – 2019-11-18 (×10): 1 mg via ORAL
  Filled 2019-11-09 (×10): qty 1

## 2019-11-09 MED ORDER — ZOLPIDEM TARTRATE 5 MG PO TABS
5.0000 mg | ORAL_TABLET | Freq: Once | ORAL | Status: AC
  Administered 2019-11-09: 5 mg via ORAL
  Filled 2019-11-09: qty 1

## 2019-11-09 MED ORDER — ALBUTEROL SULFATE (2.5 MG/3ML) 0.083% IN NEBU
2.5000 mg | INHALATION_SOLUTION | Freq: Four times a day (QID) | RESPIRATORY_TRACT | Status: DC | PRN
  Administered 2019-11-15: 2.5 mg via RESPIRATORY_TRACT
  Filled 2019-11-09: qty 3

## 2019-11-09 MED ORDER — SODIUM CHLORIDE 0.9% FLUSH
10.0000 mL | Freq: Two times a day (BID) | INTRAVENOUS | Status: DC
  Administered 2019-11-09 – 2019-11-18 (×6): 10 mL

## 2019-11-09 MED ORDER — POLYETHYLENE GLYCOL 3350 17 G PO PACK
17.0000 g | PACK | Freq: Every day | ORAL | Status: DC | PRN
  Administered 2019-11-11: 17 g via ORAL
  Filled 2019-11-09: qty 1

## 2019-11-09 MED ORDER — SODIUM CHLORIDE 0.9% FLUSH
3.0000 mL | Freq: Two times a day (BID) | INTRAVENOUS | Status: DC
  Administered 2019-11-09 – 2019-11-15 (×5): 3 mL via INTRAVENOUS

## 2019-11-09 MED ORDER — ALBUTEROL SULFATE HFA 108 (90 BASE) MCG/ACT IN AERS: 2.0000 | INHALATION_SPRAY | Freq: Four times a day (QID) | RESPIRATORY_TRACT | Status: DC | PRN

## 2019-11-09 MED ORDER — UMECLIDINIUM-VILANTEROL 62.5-25 MCG/INH IN AEPB
1.0000 | INHALATION_SPRAY | Freq: Every day | RESPIRATORY_TRACT | Status: DC
  Administered 2019-11-11 – 2019-11-17 (×7): 1 via RESPIRATORY_TRACT
  Filled 2019-11-09 (×3): qty 14

## 2019-11-09 MED ORDER — INSULIN ASPART 100 UNIT/ML ~~LOC~~ SOLN
0.0000 [IU] | SUBCUTANEOUS | Status: DC
  Administered 2019-11-09: 1 [IU] via SUBCUTANEOUS
  Administered 2019-11-09 – 2019-11-10 (×2): 2 [IU] via SUBCUTANEOUS
  Administered 2019-11-11: 1 [IU] via SUBCUTANEOUS
  Administered 2019-11-11 (×2): 2 [IU] via SUBCUTANEOUS
  Administered 2019-11-11: 5 [IU] via SUBCUTANEOUS
  Administered 2019-11-12: 1 [IU] via SUBCUTANEOUS
  Administered 2019-11-12: 2 [IU] via SUBCUTANEOUS
  Administered 2019-11-12: 5 [IU] via SUBCUTANEOUS
  Administered 2019-11-13: 1 [IU] via SUBCUTANEOUS
  Administered 2019-11-13: 5 [IU] via SUBCUTANEOUS
  Administered 2019-11-13: 2 [IU] via SUBCUTANEOUS
  Administered 2019-11-14 (×2): 1 [IU] via SUBCUTANEOUS
  Administered 2019-11-14: 3 [IU] via SUBCUTANEOUS
  Administered 2019-11-15 (×3): 2 [IU] via SUBCUTANEOUS
  Administered 2019-11-16 (×2): 3 [IU] via SUBCUTANEOUS
  Filled 2019-11-09: qty 0.09

## 2019-11-09 MED ORDER — VANCOMYCIN HCL 1250 MG/250ML IV SOLN
1250.0000 mg | Freq: Two times a day (BID) | INTRAVENOUS | Status: DC
  Administered 2019-11-09 – 2019-11-12 (×7): 1250 mg via INTRAVENOUS
  Filled 2019-11-09 (×7): qty 250

## 2019-11-09 MED ORDER — LIDOCAINE HCL 1 % IJ SOLN
INTRAMUSCULAR | Status: AC
  Filled 2019-11-09: qty 20

## 2019-11-09 MED ORDER — TAMSULOSIN HCL 0.4 MG PO CAPS
0.4000 mg | ORAL_CAPSULE | Freq: Every day | ORAL | Status: DC
  Administered 2019-11-09 – 2019-11-18 (×10): 0.4 mg via ORAL
  Filled 2019-11-09 (×10): qty 1

## 2019-11-09 MED ORDER — SODIUM CHLORIDE 0.9 % IV SOLN: 250.0000 mL | INTRAVENOUS | Status: DC | PRN

## 2019-11-09 MED ORDER — METOPROLOL TARTRATE 25 MG PO TABS
25.0000 mg | ORAL_TABLET | Freq: Two times a day (BID) | ORAL | Status: DC
  Administered 2019-11-09 – 2019-11-17 (×18): 25 mg via ORAL
  Filled 2019-11-09 (×8): qty 1
  Filled 2019-11-09: qty 2
  Filled 2019-11-09 (×10): qty 1

## 2019-11-09 NOTE — Consult Note (Addendum)
Consultation Note Date: 11/09/2019   Patient Name: Jeremy [name]  DOB: [birth year]  MRN: [id number]  Age / Sex: 74 y.o., male  PCP: Quintin Alto [name], MD Referring Physician: Aline August, MD  Reason for Consultation: Establishing goals of care  HPI/Patient Profile: 74 y.o. 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 Julien Nordmann), psoriatic arthritis (methotrexate stopped in 07/2019), COPD, and DM, who was admitted on 11/08/2019 with fever, chest pain and cough.  A recent CT scan ordered by Dr. Julien Nordmann revealed increasing loculated right pleural effusion with possible empyema.  He was evaluated by CT surgery who felt he is a poor surgical candidate but would benefit from IR drain placement to help with the pleural effusion.   Lactic acid is now 2.0. Albumin is 2.2 with protein at 8.4.  Hgb 9.7, creatinine 0.9.  He was taken to IR for thoracentesis but was too weak to sit up and did not have the procedure.  [name] is currently in the ER and has been here for 17 hours.  At 6'3" his legs are hanging off the stretcher and he is very uncomfortable.  His wife shows me his severe skin rash caused by Keytruda.  Patient complains about pain and urinary urgency.  His wife states he is up every 30 minutes at night.  Of note he saw Dr. Julien Nordmann on 6/2 and his chemo treatment was put off in favor of work up for his current illness.  Clinical Assessment and Goals of Care:  I have reviewed medical records including EPIC notes, labs and imaging, received report from the care team, examined the patient and met at bedside with his wife Jeremy [name]  to discuss diagnosis prognosis, [place], EOL wishes, disposition and options.  I introduced Palliative Medicine as specialized medical care for people living with serious illness. It focuses on providing relief from the symptoms and stress of a serious illness.    We discussed a brief life review of the patient. 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  He is [place] but does not regularly attend church.  He lives at home with his wife Jeremy [name].  As far as functional and nutritional status until 1 month ago he was walking well.  Now he primarily moves from the bed to the chair, and otherwise is not very mobile.  He lost weight from 204 to 163 but after being on prednisone recently gained a bit of weight back.  His eating has become poor again with a recent urinary tract infection.  He does like chocolate ensure.  Wife inquired about CT results.  I briefly reviewed the loculated pleural effusion with possible empyema and explained why that may be concerning.  Also noted new nodularity at the bottom of left lung and suggested she discuss that finding further with Dr. Julien Nordmann.  As [name] is in the ER and is rather uncomfortable, I talked with he and his wife briefly about the pleural effusion and possible empyema as well as the recommendation for pig tail drain to be placed.   I attended to his symptom management and requested he be moved from a stretcher to a hospital bed for comfort.  Questions and concerns were addressed.  The family was encouraged to call with questions or concerns.  PMT will follow up on Mon/Tues.   Primary Decision Maker:  PATIENT  Wife is HCPOA.    [facility] bed requested (in ER) for comfort. Pain medication requested. Placed order for Flomax given urinary urgency. Order for Senna already on the chart. Triamcinolone cream ordered for care of rash Patient is already DNR. [name] is eligible for Hospice services if he does not desire chemo/rad.  Will require either Palliative or Hospice at discharge.  PMT will follow to determine what he will benefit most from.  Code  Status/Advance Care Planning:  DNR   Symptom Management:   As above  Additional Recommendations (Limitations, Scope, Preferences):  Full Scope Treatment  Palliative Prophylaxis:   Frequent Pain Assessment, bowel regimen  Psycho-social/Spiritual:   Desire for further Chaplaincy support: not at this time thanks  Prognosis: unable to determine.    Discharge Planning: To Be Determined      Primary Diagnoses: Present on Admission:  Loculated pleural effusion  COPD (chronic obstructive pulmonary disease) (HCC)  HTN (hypertension)  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HCC)  AF (paroxysmal atrial fibrillation) (HCC)  Acquired thrombophilia (HCC)  Anemia in neoplastic disease   I have reviewed the medical record, interviewed the patient and family, and examined the patient. The following aspects are pertinent.  Past Medical History:  Diagnosis Date   Cancer ([place])    mass right lung   COPD (chronic obstructive pulmonary disease) (HCC)    QUIT SMOKING 30 YRS AGO   Diabetes ([place]) 2018   HTN (hypertension)    Psoriatic arthritis ([place]) 2015   Social History   Socioeconomic History   Marital status: Married    Spouse name: Not on file   Number of children: Not on file   Years of education: Not on file   Highest education level: Not on file  Occupational History   Not on file  Tobacco Use   Smoking status: Former Smoker    Packs/day: 0.50    Years: 50.00    Pack years: 25.00    Types: Cigarettes    Quit date: 12/26/1991    Years since quitting: 27.8   Smokeless tobacco: Never Used  Substance and Sexual Activity   Alcohol use: Yes    Comment: occ beer   Drug use: Never   Sexual activity: Yes    Birth control/protection: None  Other Topics Concern   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 ITGPQDI(2641). 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   Social  Determinants of Health   Financial Resource Strain:    Difficulty of Paying Living Expenses:   Food Insecurity:    Worried About Charity fundraiser in the Last Year:    Arboriculturist in the Last Year:   Transportation Needs:    Film/video editor (Medical):    Lack of Transportation (Non-Medical):   Physical Activity:    Days of Exercise  per Week:    Minutes of Exercise per Session:   Stress:    Feeling of Stress :   Social Connections:    Frequency of Communication with Friends and Family:    Frequency of Social Gatherings with Friends and Family:    Attends Religious Services:    Active Member of Clubs or Organizations:    Attends Music therapist:    Marital Status:    Family History  Problem Relation Age of Onset   Alcoholism Father    CAD Brother    Diabetes Brother    Colon cancer Neg Hx    Colon polyps Neg Hx    Gastric cancer Neg Hx     Allergies  Allergen Reactions   Chlorhexidine    Aleve [Naproxen Sodium] Hives and Rash     Vital Signs: BP 102/78    Pulse 88    Temp 98.1 F (36.7 C)    Resp 18    Wt 81.6 kg    SpO2 95%    BMI 23.10 kg/m  Pain Scale: 0-10   Pain Score: 5    SpO2: SpO2: 95 % O2 Device:SpO2: 95 % O2 Flow Rate: .O2 Flow Rate (L/min): 2 L/min    Palliative Assessment/Data: 40%     Time In: 11:00 Time Out: 12:00 Time Total: 60 min. Visit consisted of counseling and education dealing with the complex and emotionally intense issues surrounding the need for palliative care and symptom management in the setting of serious and potentially life-threatening illness. Greater than 50%  of this time was spent counseling and coordinating care related to the above assessment and plan.  Signed by: Florentina Jenny, PA-C Palliative Medicine  Please contact Palliative Medicine Team phone at [phone] for questions and concerns.  For individual provider: See Shea Evans

## 2019-11-09 NOTE — Progress Notes (Signed)
Patient ID: Jeremy [name], male   DOB: 18-Feb-1946, 74 y.o.   MRN: [id number]  PROGRESS NOTE    Jeremy [name]  DXI:[id number] DOB: January 10, 1946 DOA: 11/08/2019 PCP: [name], MD   Brief Narrative:  74 year old male with history of stage IV lung cancer status post palliative radiotherapy and now on chemotherapy, COPD with chronic hypoxic respiratory failure, paroxysmal A. fib not on anticoagulation, chronic anemia requiring intermittent transfusions, recent admission with sepsis secondary to pneumonia and treated with antibiotics presented on 11/08/2019 with fevers, right-sided chest pain and cough.  In the ED, he was tachycardic and chest x-ray revealed stable loculated right pleural effusion with compressive atelectasis.  Outpatient CT of the chest was concerning for increasing right pleural effusion with question of possible empyema.  Patient was started on broad-spectrum antibiotics and IR was consulted for possible ultrasound-guided thoracentesis.  Assessment & Plan:   Right pleural effusion, suspect parapneumonic with concern for empyema -Presented with fevers and cough with outpatient CT concerning for increasing right pleural effusion with question of possible empyema -Currently on broad-spectrum antibiotics.  Follow cultures. -Patient was supposed to have ultrasound-guided thoracentesis by IR today but she could not sit up for the procedure and requested that the procedure be done tomorrow -Continue broad-spectrum antibiotics in the meantime.  I also spoke to Dr. Vantrigt/cardiothoracic surgery to see if patient requires surgical intervention as well.  He will look at the films and call me back.  Stage IV lung cancer -Status post palliative radiotherapy and currently on chemotherapy -Follows up with Dr. Julien Nordmann as an outpatient -Oncology team has been added to care teams -We will request palliative care consultation for goals of care discussion.  Overall prognosis is guarded to  poor -Patient has chronic skin rash which started after he was given Keytruda.  Subsequently [name] has been discontinued.  Chronic hypoxic respiratory failure COPD -Uses supplemental oxygen via nasal cannula 2 L/min intermittently. -Currently not wheezing.  Continue supplemental oxygen and nebs.  Anemia of chronic disease -Hemoglobin stable.  Monitor  Paroxysmal A. Fib -Currently in sinus rhythm.  No anticoagulation because of transfusion dependent anemia - continue diltiazem and metoprolol  Diabetes mellitus type 2 -A1c was 7 in March 2021 - continue CBGs with SSI  Generalized conditioning -will need PT evaluation  DVT prophylaxis: SCDs Code Status: DNR Family Communication: Wife at bedside Disposition Plan: Status is: Inpatient  Remains inpatient appropriate because:Inpatient level of care appropriate due to severity of illness.  Currently on IV antibiotics and will need ultrasound-guided thoracentesis and further input from CT surgery   Dispo: The patient is from: Home              Anticipated d/c is to: Home              Anticipated d/c date is: > 3 days              Patient currently is not medically stable to d/c.  Consultants: IR.  Spoke to Dr. Vantrigt/CT surgery on phone on 11/08/2019.  Will consult palliative care  Procedures: None  Antimicrobials: Cefepime, Flagyl and vancomycin from 11/08/2019 onwards   Subjective: Patient seen and examined at bedside.  States that he has not slept for 20 hours and is requesting to have something to sleep during the day.  Complains of intermittent cough and shortness of breath.  Denies overnight fever or vomiting.  Objective: Vitals:   11/09/19 0830 11/09/19 0849 11/09/19 0901 11/09/19 1017  BP: 140/65 (!) 147/79 Marland Kitchen)  141/83 (!) 141/83  Pulse: 96   96  Resp: (!) 26   20  Temp:      SpO2: 97%   97%  Weight:        Intake/Output Summary (Last 24 hours) at 11/09/2019 1107 Last data filed at 11/09/2019 0721 Gross per 24  hour  Intake 2627.62 ml  Output --  Net 2627.62 ml   Filed Weights   11/09/19 0500  Weight: 81.6 kg    Examination:  General exam: Appears calm and comfortable.  Looks chronically ill. Respiratory system: Bilateral decreased breath sounds at bases with scattered crackles.  Intermittently tachypneic Cardiovascular system: S1 & S2 heard, Rate controlled Gastrointestinal system: Abdomen is nondistended, soft and nontender. Normal bowel sounds heard. Extremities: No cyanosis, clubbing; lower extremity edema present Central nervous system: Alert and oriented.  Poor historian.  Very slow to respond.  No focal neurological deficits. Moving extremities Skin: Erythematous plaques involving upper and lower extremities, chronic, Keytruda induced as per patient/wife Psychiatry: Anxious looking    Data Reviewed: I have personally reviewed following labs and imaging studies  CBC: Recent Labs  Lab 11/05/19 0942 11/08/19 2023 11/08/19 2107  WBC 8.8 8.7  --   NEUTROABS 7.1 6.8  --   HGB 7.8* 9.7* 10.2*  HCT 25.9* 32.6* 30.0*  MCV 94.2 92.1  --   PLT 288 346  --    Basic Metabolic Panel: Recent Labs  Lab 11/05/19 0942 11/08/19 2023 11/08/19 2107  NA 140 139 139  K 4.3 3.7 3.7  CL 100 97* 98  CO2 28 26  --   GLUCOSE 154* 188* 193*  BUN 14 16 14   CREATININE 0.76 0.90 0.80  CALCIUM 9.0 8.1*  --    GFR: Estimated Creatinine Clearance: 92.1 mL/min (by C-G formula based on SCr of 0.8 mg/dL). Liver Function Tests: Recent Labs  Lab 11/05/19 0942 11/08/19 2023  AST 17 22  ALT 20 23  ALKPHOS 67 68  BILITOT 0.4 0.6  PROT 8.1 8.4*  ALBUMIN 1.7* 2.2*   No results for input(s): LIPASE, AMYLASE in the last 168 hours. No results for input(s): AMMONIA in the last 168 hours. Coagulation Profile: Recent Labs  Lab 11/08/19 2023  INR 1.1   Cardiac Enzymes: No results for input(s): CKTOTAL, CKMB, CKMBINDEX, TROPONINI in the last 168 hours. BNP (last 3 results) No results for  input(s): PROBNP in the last 8760 hours. HbA1C: No results for input(s): HGBA1C in the last 72 hours. CBG: Recent Labs  Lab 11/09/19 0758  GLUCAP 105*   Lipid Profile: No results for input(s): CHOL, HDL, LDLCALC, TRIG, CHOLHDL, LDLDIRECT in the last 72 hours. Thyroid Function Tests: No results for input(s): TSH, T4TOTAL, FREET4, T3FREE, THYROIDAB in the last 72 hours. Anemia Panel: No results for input(s): VITAMINB12, FOLATE, FERRITIN, TIBC, IRON, RETICCTPCT in the last 72 hours. Sepsis Labs: Recent Labs  Lab 11/08/19 0118 11/08/19 2023  LATICACIDVEN 0.8 2.0*    Recent Results (from the past 240 hour(s))  SARS CORONAVIRUS 2 (TAT 6-24 HRS) Nasopharyngeal Nasopharyngeal Swab     Status: None   Collection Time: 11/08/19  1:32 PM   Specimen: Nasopharyngeal Swab  Result Value Ref Range Status   SARS Coronavirus 2 NEGATIVE NEGATIVE Final    Comment: (NOTE) SARS-CoV-2 target nucleic acids are NOT DETECTED. The SARS-CoV-2 RNA is generally detectable in upper and lower respiratory specimens during the acute phase of infection. Negative results do not preclude SARS-CoV-2 infection, do not rule out co-infections with other  pathogens, and should not be used as the sole basis for treatment or other patient management decisions. Negative results must be combined with clinical observations, patient history, and epidemiological information. The expected result is Negative. Fact Sheet for Patients: SugarRoll.be Fact Sheet for Healthcare Providers: https://www.woods-mathews.com/ This test is not yet approved or cleared by the [place] FDA and  has been authorized for detection and/or diagnosis of SARS-CoV-2 by FDA under an Emergency Use Authorization (EUA). This EUA will remain  in effect (meaning this test can be used) for the duration of the COVID-19 declaration under Section 56 4(b)(1) of the Act, 21 U.S.C. section 360bbb-3(b)(1), unless  the authorization is terminated or revoked sooner. Performed at [facility], [place] [address] [place], [place] [address]          Radiology Studies: DG Chest 2 View  Result Date: 11/08/2019 CLINICAL DATA:  Fever, cough, chills, stage IV lung cancer EXAM: CHEST - 2 VIEW COMPARISON:  10/19/2019, 11/07/2019 FINDINGS: Single frontal view of the chest demonstrates stable right chest wall port. Loculated right pleural effusion unchanged, with compressive atelectasis at the right lung base. No new airspace disease. No pneumothorax. Cardiac silhouette is stable. No acute bony abnormalities. IMPRESSION: 1. Stable loculated right pleural effusion with compressive atelectasis at the right lung base. Electronically Signed   By: Randa Ngo M.D.   On: 11/08/2019 21:16        Scheduled Meds: . budesonide (PULMICORT) nebulizer solution  0.25 mg Nebulization BID  . diltiazem  360 mg Oral Daily  . folic acid  1 mg Oral Daily  . gabapentin  600 mg Oral QHS  . insulin aspart  0-9 Units Subcutaneous Q4H  . metoprolol tartrate  25 mg Oral BID  . pantoprazole  40 mg Oral BID AC  . senna-docusate  2 tablet Oral QHS  . sodium chloride flush  3 mL Intravenous Q12H  . sodium chloride flush  3 mL Intravenous Q12H  . umeclidinium-vilanterol  1 puff Inhalation Daily   Continuous Infusions: . sodium chloride    . ceFEPime (MAXIPIME) IV    . metronidazole Stopped (11/09/19 0721)  . vancomycin            Aline August, MD [facility] 11/09/2019, 11:07 AM

## 2019-11-09 NOTE — Progress Notes (Signed)
Port access established by IV team nurse. Vancomycin IVPB infusing as ordered. Flagyl IVPB infusing via left AC. Protonix and Flomax have been given PO and Kenalog cream applied as ordered. Cefepime has been requested from pharmacy. MRSA PCR screening completed and sent to lab. Urine specimen collection pending awaiting sufficient urinary output from patient.

## 2019-11-09 NOTE — Progress Notes (Signed)
Patient transferred from ED to Room 1540. Spouse at bedside.

## 2019-11-09 NOTE — Progress Notes (Signed)
  Subjective: Recent CT scan of chest images personally reviewed and patient discussed with Dr. Starla Link for coordination of care. 74 year old advanced lung cancer, DNR. He presents to the ED with fever shortness of breath and a large pleural effusion which has features suggesting empyema. The patient is not a candidate for surgery but would probably benefit from a CT-guided pigtail chest tube placement by interventional radiology and IV antibiotics.  If the patient survives to discharge I could follow the pigtail catheter in the office if needed.  Objective: Vital signs in last 24 hours: Temp:  [98.1 F (36.7 C)] 98.1 F (36.7 C) (06/05 2108) Pulse Rate:  [84-115] 96 (06/06 1017) Resp:  [12-33] 20 (06/06 1017) BP: (113-152)/(64-113) 141/83 (06/06 1017) SpO2:  [94 %-100 %] 97 % (06/06 1017) Weight:  [81.6 kg] 81.6 kg (06/06 0500)  Hemodynamic parameters for last 24 hours:    Intake/Output from previous day: 06/05 0701 - 06/06 0700 In: 2527.6 [IV Piggyback:2527.6] Out: -  Intake/Output this shift: Total I/O In: 100 [IV Piggyback:100] Out: -     Lab Results: Recent Labs    11/08/19 2023 11/08/19 2107  WBC 8.7  --   HGB 9.7* 10.2*  HCT 32.6* 30.0*  PLT 346  --    BMET:  Recent Labs    11/08/19 2023 11/08/19 2107  NA 139 139  K 3.7 3.7  CL 97* 98  CO2 26  --   GLUCOSE 188* 193*  BUN 16 14  CREATININE 0.90 0.80  CALCIUM 8.1*  --     PT/INR:  Recent Labs    11/08/19 2023  LABPROT 13.3  INR 1.1   ABG    Component Value Date/Time   HCO3 28.7 (H) 08/06/2019 1350   TCO2 30 11/08/2019 2107   O2SAT 31.6 08/06/2019 1350   CBG (last 3)  Recent Labs    11/09/19 0758  GLUCAP 105*    Assessment/Plan: S/P  Stage IV carcinoma of the right lung Loculated right pleural effusion with features of empyema on CT scan Recommend IV antibiotics and pigtail catheter placement by IR to drain loculated effusion [with cultures of fluid.]   LOS: 1 day    Tharon Aquas  Trigt III 11/09/2019

## 2019-11-09 NOTE — Progress Notes (Signed)
Pharmacy Antibiotic Note  Jeremy [name] is a 74 y.o. male admitted on 11/08/2019 with pneumonia.  Pharmacy has been consulted for Vancomycin, cefepime dosing.  Plan: Vancomycin 1250 mg IV Q 12 hrs. Goal AUC 400-550. also Vancomycin goal trough 15-20  Cefepime 2gm iv q8hr  Weight: 81.6 kg (179 lb 14.3 oz)  Temp (24hrs), Avg:98.1 F (36.7 C), Min:98.1 F (36.7 C), Max:98.1 F (36.7 C)  Recent Labs  Lab 11/05/19 0942 11/08/19 0118 11/08/19 2023 11/08/19 2107  WBC 8.8  --  8.7  --   CREATININE 0.76  --  0.90 0.80  LATICACIDVEN  --  0.8 2.0*  --     Estimated Creatinine Clearance: 92.1 mL/min (by C-G formula based on SCr of 0.8 mg/dL).    Allergies  Allergen Reactions  . Chlorhexidine   . Aleve [Naproxen Sodium] Hives and Rash    Antimicrobials this admission: Vancomycin 11/08/2019 >> Cefepime 11/08/2019 >>   Dose adjustments this admission: -  Microbiology results: -  Thank you for allowing pharmacy to be a part of this patient's care.  Nani Skillern Crowford 11/09/2019 6:19 AM

## 2019-11-09 NOTE — Progress Notes (Signed)
IR requested by Dr. Myna Hidalgo for possible image-guided thoracentesis.  Patient was brought down for procedure. Limited chest US revealed a loculated right pleural effusion. Site for entry was marked and time-out was preformed. Before patient was prepped for procedure, he stated that he was tired, did not want to proceed today, and asking to go back to his room and we try again tomorrow when he's "more awake than now". Stated he was not able to sit up during procedure due to being "too sleepy". Called patient's wife, [name], and gave updates. Will attempt procedure again tomorrow in IR. Dr. Starla Link made aware.  Please call IR with questions/concerns.   Bea Graff Akina Maish, PA-C 11/09/2019, 9:30 AM

## 2019-11-09 NOTE — ED Notes (Signed)
Condom catheter came off and replaced it with a new one.  Also adjusted pt in bed and placed a new sheet, chuck and brief on pt.

## 2019-11-09 NOTE — ED Notes (Signed)
Pt refused Thoracentesis

## 2019-11-10 ENCOUNTER — Telehealth: Payer: Self-pay | Admitting: Medical Oncology

## 2019-11-10 ENCOUNTER — Inpatient Hospital Stay (HOSPITAL_COMMUNITY): Payer: Medicare Other

## 2019-11-10 ENCOUNTER — Ambulatory Visit (HOSPITAL_COMMUNITY): Payer: Medicare Other

## 2019-11-10 ENCOUNTER — Encounter (HOSPITAL_COMMUNITY): Payer: Self-pay | Admitting: Family Medicine

## 2019-11-10 DIAGNOSIS — J90 Pleural effusion, not elsewhere classified: Secondary | ICD-10-CM

## 2019-11-10 DIAGNOSIS — D630 Anemia in neoplastic disease: Secondary | ICD-10-CM

## 2019-11-10 DIAGNOSIS — J9611 Chronic respiratory failure with hypoxia: Secondary | ICD-10-CM

## 2019-11-10 DIAGNOSIS — C3491 Malignant neoplasm of unspecified part of right bronchus or lung: Secondary | ICD-10-CM

## 2019-11-10 LAB — BASIC METABOLIC PANEL
Anion gap: 10 (ref 5–15)
BUN: 12 mg/dL (ref 8–23)
CO2: 28 mmol/L (ref 22–32)
Calcium: 7.8 mg/dL — ABNORMAL LOW (ref 8.9–10.3)
Chloride: 97 mmol/L — ABNORMAL LOW (ref 98–111)
Creatinine, Ser: 0.6 mg/dL — ABNORMAL LOW (ref 0.61–1.24)
GFR calc Af Amer: >60 mL/min (ref >60)
GFR calc non Af Amer: >60 mL/min (ref >60)
Glucose, Bld: 109 mg/dL — ABNORMAL HIGH (ref 70–99)
Potassium: 3.1 mmol/L — ABNORMAL LOW (ref 3.5–5.1)
Sodium: 135 mmol/L (ref 135–145)

## 2019-11-10 LAB — CBC
HCT: 28.4 % — ABNORMAL LOW (ref 39.0–52.0)
Hemoglobin: 8.6 g/dL — ABNORMAL LOW (ref 13.0–17.0)
MCH: 27.2 pg (ref 26.0–34.0)
MCHC: 30.3 g/dL (ref 30.0–36.0)
MCV: 89.9 fL (ref 80.0–100.0)
Platelets: 274 10*3/uL (ref 150–400)
RBC: 3.16 MIL/uL — ABNORMAL LOW (ref 4.22–5.81)
RDW: 17 % — ABNORMAL HIGH (ref 11.5–15.5)
WBC: 7.9 10*3/uL (ref 4.0–10.5)
nRBC: 0 % (ref 0.0–0.2)

## 2019-11-10 LAB — GLUCOSE, CAPILLARY
Glucose-Capillary: 103 mg/dL — ABNORMAL HIGH (ref 70–99)
Glucose-Capillary: 107 mg/dL — ABNORMAL HIGH (ref 70–99)
Glucose-Capillary: 109 mg/dL — ABNORMAL HIGH (ref 70–99)
Glucose-Capillary: 93 mg/dL (ref 70–99)
Glucose-Capillary: 177 mg/dL — ABNORMAL HIGH (ref 70–99)

## 2019-11-10 LAB — MAGNESIUM: Magnesium: 1.4 mg/dL — ABNORMAL LOW (ref 1.7–2.4)

## 2019-11-10 MED ORDER — ZOLPIDEM TARTRATE 5 MG PO TABS
5.0000 mg | ORAL_TABLET | Freq: Every evening | ORAL | Status: DC | PRN
  Administered 2019-11-10: 5 mg via ORAL
  Filled 2019-11-10: qty 1

## 2019-11-10 MED ORDER — NALOXONE HCL 0.4 MG/ML IJ SOLN
INTRAMUSCULAR | Status: AC
  Filled 2019-11-10: qty 1

## 2019-11-10 MED ORDER — MIDAZOLAM HCL 2 MG/2ML IJ SOLN
INTRAMUSCULAR | Status: AC | PRN
  Administered 2019-11-10: 1 mg via INTRAVENOUS

## 2019-11-10 MED ORDER — FLUMAZENIL 0.5 MG/5ML IV SOLN
INTRAVENOUS | Status: AC
  Filled 2019-11-10: qty 5

## 2019-11-10 MED ORDER — LIDOCAINE HCL (PF) 1 % IJ SOLN
INTRAMUSCULAR | Status: AC | PRN
  Administered 2019-11-10: 10 mL via INTRADERMAL

## 2019-11-10 MED ORDER — SODIUM CHLORIDE 0.9 % IV SOLN
INTRAVENOUS | Status: AC
  Filled 2019-11-10: qty 250

## 2019-11-10 MED ORDER — POTASSIUM CHLORIDE CRYS ER 20 MEQ PO TBCR
40.0000 meq | EXTENDED_RELEASE_TABLET | ORAL | Status: AC
  Administered 2019-11-10 (×2): 40 meq via ORAL
  Filled 2019-11-10 (×2): qty 2

## 2019-11-10 MED ORDER — MIDAZOLAM HCL 2 MG/2ML IJ SOLN
INTRAMUSCULAR | Status: AC
  Filled 2019-11-10: qty 2

## 2019-11-10 MED ORDER — MAGNESIUM SULFATE 2 GM/50ML IV SOLN
2.0000 g | Freq: Once | INTRAVENOUS | Status: AC
  Administered 2019-11-10: 2 g via INTRAVENOUS
  Filled 2019-11-10: qty 50

## 2019-11-10 NOTE — Progress Notes (Signed)
HEMATOLOGY-ONCOLOGY PROGRESS NOTE  SUBJECTIVE: Jeremy [name] presented to the emergency room with fevers, cough, and right-sided chest pain.  His wife reports he had a fever up to 102 at home.  He had a CT scan performed last week as an outpatient concerning for an increase in the right pleural effusion with questionable empyema.  He has been started on broad-spectrum antibiotics.  Has been afebrile for the past 24 hours. Denies chills.  Has intermittent right-sided chest pain but this is improving.  He was scheduled for thoracentesis over the weekend but he could not sit up to have this procedure done secondary to weakness.  He is scheduled for a right chest tube placement by IR later today.  He was able to ambulate with physical therapy in the hallway today.  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[place])   09/01/2019 -  Chemotherapy   The patient had palonosetron (ALOXI) injection 0.25 mg, 0.25 mg, Intravenous,  Once, 3 of 4 cycles Administration: 0.25 mg (09/01/2019), 0.25 mg (10/14/2019), 0.25 mg (09/23/2019) pegfilgrastim-jmdb (FULPHILA) injection 6 mg, 6 mg, Subcutaneous,  Once, 2 of 3 cycles Administration: 6 mg (09/03/2019), 6 mg (09/25/2019) CARBOplatin (PARAPLATIN) 460 mg in sodium chloride 0.9 % 250 mL chemo infusion, 460 mg (100 % of original dose 461.5 mg), Intravenous,  Once, 3 of 4 cycles Dose modification: 461.5 mg (original dose 461.5 mg, Cycle 1) Administration: 460 mg (09/01/2019), 460 mg (10/14/2019), 460 mg (09/23/2019) fosaprepitant (EMEND) 150 mg in sodium chloride 0.9 % 145 mL IVPB, 150 mg, Intravenous,  Once, 3 of 4 cycles Administration: 150 mg (09/01/2019), 150 mg (10/14/2019), 150 mg (09/23/2019) PACLitaxel (TAXOL) 348 mg in sodium chloride 0.9 % 500 mL chemo infusion (> 54m/m2), 175 mg/m2 = 348 mg (100 % of original dose 175 mg/m2), Intravenous,  Once, 3 of 4 cycles Dose  modification: 175 mg/m2 (original dose 175 mg/m2, Cycle 1, Reason: Provider Judgment) Administration: 348 mg (09/01/2019), 348 mg (10/14/2019), 348 mg (09/23/2019) pembrolizumab (KEYTRUDA) 200 mg in sodium chloride 0.9 % 50 mL chemo infusion, 200 mg, Intravenous, Once, 1 of 3 cycles Administration: 200 mg (09/01/2019)  for chemotherapy treatment.       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  I have reviewed the past medical history, past surgical history, social history and family history with the patient and they are unchanged from previous note.   PHYSICAL EXAMINATION: ECOG PERFORMANCE STATUS: 3 - Symptomatic, >50% confined to bed  Vitals:   11/10/19 0802 11/10/19 0837  BP:  (!) 141/89  Pulse:  (!) 105  Resp:  16  Temp:  97.8 F (36.6 C)  SpO2: 92% 95%   Filed Weights   11/09/19 0500  Weight: 81.6 kg    Intake/Output from previous day: 06/06 0701 - 06/07 0700 In: 1240 [P.O.:240; IV Piggyback:1000] Out: 1300 [Urine:1300]  GENERAL:alert, no distress and comfortable SKIN: Widespread rash to his arms and legs secondary to psoriatic arthritis and prior immunotherapy-stable OROPHARYNX: No  thrush or mucositis LUNGS: Diminished right mid lung down to base HEART: regular rate & rhythm and no murmurs and no lower extremity edema ABDOMEN:abdomen soft, non-tender and normal bowel sounds  NEURO: alert & oriented x 3 with fluent speech, no focal motor/sensory  deficits  LABORATORY DATA:  I have reviewed the data as listed CMP Latest Ref Rng & Units 11/10/2019 11/09/2019 11/08/2019  Glucose 70 - 99 mg/dL 109(H) - 193(H)  BUN 8 - 23 mg/dL 12 - 14  Creatinine 0.61 - 1.24 mg/dL 0.60(L) - 0.80  Sodium 135 - 145 mmol/L 135 - 139  Potassium 3.5 - 5.1 mmol/L 3.1(L) - 3.7  Chloride 98 - 111 mmol/L 97(L) - 98  CO2 22 - 32 mmol/L 28 - -  Calcium 8.9 - 10.3 mg/dL 7.8(L) - -  Total Protein 6.5 - 8.1 g/dL - 7.5 -  Total Bilirubin 0.3 - 1.2 mg/dL - 0.7 -  Alkaline Phos 38 - 126 U/L - 59 -  AST 15 - 41 U/L - 16 -  ALT 0 - 44 U/L - 20 -    Lab Results  Component Value Date   WBC 7.9 11/10/2019   HGB 8.6 (L) 11/10/2019   HCT 28.4 (L) 11/10/2019   MCV 89.9 11/10/2019   PLT 274 11/10/2019   NEUTROABS 6.8 11/08/2019    DG Chest 2 View  Result Date: 11/08/2019 CLINICAL DATA:  Fever, cough, chills, stage IV lung cancer EXAM: CHEST - 2 VIEW COMPARISON:  10/19/2019, 11/07/2019 FINDINGS: Single frontal view of the chest demonstrates stable right chest wall port. Loculated right pleural effusion unchanged, with compressive atelectasis at the right lung base. No new airspace disease. No pneumothorax. Cardiac silhouette is stable. No acute bony abnormalities. IMPRESSION: 1. Stable loculated right pleural effusion with compressive atelectasis at the right lung base. Electronically Signed   By: Randa Ngo M.D.   On: 11/08/2019 21:16   DG Chest 2 View  Result Date: 10/16/2019 CLINICAL DATA:  Lung cancer and weakness EXAM: CHEST - 2 VIEW COMPARISON:  08/06/2019 CT.  PET-CT 09/08/2019 FINDINGS: Right lower chest opacification with volume loss from necrotic mass by comparison CT. Small to moderate right lateral pleural effusion has increased from prior. Left chest is clear. Stable heart size. Right port with tip at the SVC. IMPRESSION: Necrotic right lung mass as seen on recent staging scan. An overlying right pleural effusion has increased from April. Electronically Signed    By: Monte Fantasia M.D.   On: 10/16/2019 04:43   CT HEAD WO CONTRAST  Result Date: 10/16/2019 CLINICAL DATA:  Mental status change, unknown cause. Additional provided: Weakness, history of lung cancer EXAM: CT HEAD WITHOUT CONTRAST TECHNIQUE: Contiguous axial images were obtained from the base of the skull through the vertex without intravenous contrast. COMPARISON:  Brain MRI 08/10/2019 FINDINGS: Brain: Please note there is limited assessment for intracranial metastatic disease on this noncontrast CT study. Mild ill-defined hypoattenuation within the cerebral white matter is nonspecific, but consistent with chronic small vessel ischemic disease. Stable, mild generalized parenchymal atrophy. There is no acute intracranial hemorrhage. No demarcated cortical infarct. No extra-axial fluid collection. No evidence of intracranial mass. No midline shift. Vascular: No hyperdense vessel.  Atherosclerotic calcifications Skull: Normal. Negative for fracture or focal lesion. Sinuses/Orbits: Visualized orbits show no acute finding. Paranasal sinus mucosal thickening, most notable within bilateral ethmoid air cells. No significant mastoid effusion. IMPRESSION: 1. Please note there is limited assessment for intracranial metastatic disease on this non-contrast CT study. 2. No  evidence of acute intracranial abnormality. 3. Mild generalized parenchymal atrophy and chronic small vessel ischemic disease. 4. Paranasal sinus mucosal thickening, most notably ethmoid. Electronically Signed   By: Kellie Simmering DO   On: 10/16/2019 09:52   CT Chest W Contrast  Result Date: 11/07/2019 CLINICAL DATA:  Non-small cell lung cancer staging, reportedly post chemo and radiotherapy. Currently on immunotherapy as well. EXAM: CT CHEST, ABDOMEN, AND PELVIS WITH CONTRAST TECHNIQUE: Multidetector CT imaging of the chest, abdomen and pelvis was performed following the standard protocol during bolus administration of intravenous contrast. CONTRAST:   145m OMNIPAQUE IOHEXOL 300 MG/ML  SOLN COMPARISON:  08/11/2019 FINDINGS: CT CHEST FINDINGS Cardiovascular: RIGHT-sided Port-A-Cath terminates at the caval to atrial junction. Heart size is normal. There is mild pericardial nodularity suggested along the RIGHT heart border on image 45 series 2 with small pericardial effusion and pre pericardial lymph nodes. Pericardial effusion is diminished. Central pulmonary vasculature is unremarkable on venous phase. RIGHT hilum remains encased by soft tissue. Mediastinum/Nodes: No thoracic inlet adenopathy. No axillary lymphadenopathy. Soft tissue in case mint of RIGHT bronchial structures is improved as is soft tissue within the RIGHT mainstem bronchus and RIGHT lower lobe bronchus. Scattered small mediastinal lymph nodes. Diminished subcarinal nodal tissue largest measuring 1.6 cm short axis previously approximately 3.2 cm short axis. No LEFT hilar nodal tissue. Small lymph node adjacent to the distal esophagus and medial to markedly improved masslike area in the RIGHT lower lobe measures 1 cm (image 44, series 2) Lungs/Pleura: Increased size of loculated pleural fluid in the RIGHT chest. Rim of enhancement has developed. Pleural fluid in the RIGHT chest measuring 17 x 9 cm on image 40 of series 2. Diminished size of the dominant masslike area in the RIGHT lower lobe. In an area that may simply represent treated tumor in collapsed lung measuring 6.2 x 4.3 cm corresponding to the necrotic appearing mass in the inferior RIGHT chest on the previous CT from August 06, 2019 that measured 10 x 8.5 cm. Margins are no longer bulging and rounded. Volume loss in the setting of enlarging pleural fluid in the RIGHT lower lobe. Diminished nodularity throughout the RIGHT upper lobe. New area of nodularity in the LEFT lung base (image 90, series 3) 1.8 cm x 0.8 cm. No pleural effusion in the LEFT chest. Motion limited assessment at the LEFT lung base. Musculoskeletal: No sign of chest wall  mass. See below for full musculoskeletal details. CT ABDOMEN PELVIS FINDINGS Hepatobiliary: No focal suspicious hepatic lesion. No biliary ductal dilation. No pericholecystic stranding. Portal vein is patent. Pancreas: Pancreas is normal without ductal dilation, inflammation or pancreatic lesion. Spleen: Spleen normal size without focal lesion. Adrenals/Urinary Tract: Adrenal glands are normal. Kidneys enhance symmetrically without sign of focal lesion or evidence of hydronephrosis. Stomach/Bowel: No acute gastrointestinal process. Colonic diverticulosis without sign of diverticulitis. Vascular/Lymphatic: Calcified atheromatous plaque of the abdominal aorta. No aneurysm. No adenopathy in the retroperitoneum. No retrocrural lymphadenopathy. No pelvic lymphadenopathy. Reproductive: Prostate unremarkable by CT. Other: No abdominal wall hernia or abnormality. No abdominopelvic ascites. Musculoskeletal: Spinal degenerative changes. No acute or destructive bone process. IMPRESSION: 1. Increased size of loculated pleural fluid in the RIGHT chest with new rim of enhancement. Findings may represent enlarging reactive effusion following radiation. Correlate with any clinical signs of infection as empyema could have a similar appearance. Given size of effusion would also correlate with any worsening shortness of breath. No gas present within the fluid. 2. Volume loss in the RIGHT chest increasing as  the masslike area in the medial RIGHT chest as diminished in size as described. Also with resolution of upper lobe nodules. 3. Motion limited assessment of lung parenchyma but with new area of nodularity at the LEFT lung base, consider close attention on short interval follow-up. 4. Diminished size of mediastinal lymph nodes within the subcarinal region in particular. 5. No evidence of metastatic disease in the abdomen or pelvis. 6. Aortic atherosclerosis. Aortic Atherosclerosis (ICD10-I70.0). Electronically Signed   By: Zetta Bills M.D.   On: 11/07/2019 15:55   CT Abdomen Pelvis W Contrast  Result Date: 11/07/2019 CLINICAL DATA:  Non-small cell lung cancer staging, reportedly post chemo and radiotherapy. Currently on immunotherapy as well. EXAM: CT CHEST, ABDOMEN, AND PELVIS WITH CONTRAST TECHNIQUE: Multidetector CT imaging of the chest, abdomen and pelvis was performed following the standard protocol during bolus administration of intravenous contrast. CONTRAST:  19m OMNIPAQUE IOHEXOL 300 MG/ML  SOLN COMPARISON:  08/11/2019 FINDINGS: CT CHEST FINDINGS Cardiovascular: RIGHT-sided Port-A-Cath terminates at the caval to atrial junction. Heart size is normal. There is mild pericardial nodularity suggested along the RIGHT heart border on image 45 series 2 with small pericardial effusion and pre pericardial lymph nodes. Pericardial effusion is diminished. Central pulmonary vasculature is unremarkable on venous phase. RIGHT hilum remains encased by soft tissue. Mediastinum/Nodes: No thoracic inlet adenopathy. No axillary lymphadenopathy. Soft tissue in case mint of RIGHT bronchial structures is improved as is soft tissue within the RIGHT mainstem bronchus and RIGHT lower lobe bronchus. Scattered small mediastinal lymph nodes. Diminished subcarinal nodal tissue largest measuring 1.6 cm short axis previously approximately 3.2 cm short axis. No LEFT hilar nodal tissue. Small lymph node adjacent to the distal esophagus and medial to markedly improved masslike area in the RIGHT lower lobe measures 1 cm (image 44, series 2) Lungs/Pleura: Increased size of loculated pleural fluid in the RIGHT chest. Rim of enhancement has developed. Pleural fluid in the RIGHT chest measuring 17 x 9 cm on image 40 of series 2. Diminished size of the dominant masslike area in the RIGHT lower lobe. In an area that may simply represent treated tumor in collapsed lung measuring 6.2 x 4.3 cm corresponding to the necrotic appearing mass in the inferior RIGHT chest on  the previous CT from August 06, 2019 that measured 10 x 8.5 cm. Margins are no longer bulging and rounded. Volume loss in the setting of enlarging pleural fluid in the RIGHT lower lobe. Diminished nodularity throughout the RIGHT upper lobe. New area of nodularity in the LEFT lung base (image 90, series 3) 1.8 cm x 0.8 cm. No pleural effusion in the LEFT chest. Motion limited assessment at the LEFT lung base. Musculoskeletal: No sign of chest wall mass. See below for full musculoskeletal details. CT ABDOMEN PELVIS FINDINGS Hepatobiliary: No focal suspicious hepatic lesion. No biliary ductal dilation. No pericholecystic stranding. Portal vein is patent. Pancreas: Pancreas is normal without ductal dilation, inflammation or pancreatic lesion. Spleen: Spleen normal size without focal lesion. Adrenals/Urinary Tract: Adrenal glands are normal. Kidneys enhance symmetrically without sign of focal lesion or evidence of hydronephrosis. Stomach/Bowel: No acute gastrointestinal process. Colonic diverticulosis without sign of diverticulitis. Vascular/Lymphatic: Calcified atheromatous plaque of the abdominal aorta. No aneurysm. No adenopathy in the retroperitoneum. No retrocrural lymphadenopathy. No pelvic lymphadenopathy. Reproductive: Prostate unremarkable by CT. Other: No abdominal wall hernia or abnormality. No abdominopelvic ascites. Musculoskeletal: Spinal degenerative changes. No acute or destructive bone process. IMPRESSION: 1. Increased size of loculated pleural fluid in the RIGHT chest with new  rim of enhancement. Findings may represent enlarging reactive effusion following radiation. Correlate with any clinical signs of infection as empyema could have a similar appearance. Given size of effusion would also correlate with any worsening shortness of breath. No gas present within the fluid. 2. Volume loss in the RIGHT chest increasing as the masslike area in the medial RIGHT chest as diminished in size as described. Also  with resolution of upper lobe nodules. 3. Motion limited assessment of lung parenchyma but with new area of nodularity at the LEFT lung base, consider close attention on short interval follow-up. 4. Diminished size of mediastinal lymph nodes within the subcarinal region in particular. 5. No evidence of metastatic disease in the abdomen or pelvis. 6. Aortic atherosclerosis. Aortic Atherosclerosis (ICD10-I70.0). Electronically Signed   By: Zetta Bills M.D.   On: 11/07/2019 15:55   DG Chest Port 1 View  Result Date: 10/19/2019 CLINICAL DATA:  Fever and urinary complaints EXAM: PORTABLE CHEST 1 VIEW COMPARISON:  Radiograph 10/16/2019 FINDINGS: Likely increasing size of the complex right pleural effusion with associated passive atelectasis though some underlying consolidation is not excluded. A necrotic lung mass is again seen in the right middle lobe, better visualized on cross-sectional imaging. Left lung is predominantly clear aside from basilar atelectasis. Left apical scarring as well. Cardiomediastinal contours are unchanged from prior, partially obscured in the right lung base by overlying opacity. Right IJ Port-A-Cath tip terminates at the superior cavoatrial junction. Telemetry leads overlie the chest. Degenerative changes are present in the imaged spine and shoulders. IMPRESSION: 1. Likely increasing size of the complex right pleural effusion with associated passive atelectasis though some underlying consolidation is not excluded in the setting of fever. 2. Necrotic right lung mass, better visualized on cross-sectional imaging. Electronically Signed   By: Lovena Le M.D.   On: 10/19/2019 17:39   DG HIP UNILAT WITH PELVIS 2-3 VIEWS RIGHT  Result Date: 10/18/2019 CLINICAL DATA:  Right hip pain for 5 days following fall, initial encounter EXAM: DG HIP (WITH OR WITHOUT PELVIS) 2-3V RIGHT COMPARISON:  None. 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 No acute fracture or  dislocation is seen. No soft tissue abnormality is noted. IMPRESSION: Degenerative change without acute abnormality. Electronically Signed   By: Inez Catalina M.D.   On: 10/18/2019 12:21    ASSESSMENT AND PLAN: This is a pleasant 74 year old Caucasian male with stage IIIc/IV (T4, N2, M0/M Ia) non-small cell lung cancer, squamous cell carcinoma.  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  PD-L1 expression of 20%.  He completed palliative radiotherapy to the obstructed right lower lobe lung mass under the care of Dr. Sondra Come on 08/28/2019.  He has been receiving systemic chemotherapy with carboplatin for an AUC of 5, paclitaxel 125 mg meter squared, and Keytruda 200 mg IV every 3 weeks.  He is status post 3 cycles of treatment.  The Keytruda was discontinued after cycle #1 secondary to significant rash.    The patient was seen last week in our office for evaluation prior to cycle #4 of his treatment, but he was too deconditioned and treatment was held.  He was given a blood transfusion due to symptomatic anemia.  He was also scheduled for a restaging CT scan of the chest,  abdomen, pelvis.  CT scan results showed increased size of the loculated pleural effusion in the right chest with a new rim of enhancement which could represent enlarging reactive effusion following radiation or possibly empyema, volume loss in the right chest increasing as the masslike area in the medial right chest has diminished in size, resolution of upper lobe nodules, new area of nodularity in the left lung base, diminished size of mediastinal lymph nodes within the subcarinal region in particular, no evidence of metastatic disease in the abdomen or  pelvis.  The patient developed fevers and presented to the emergency room.  He has been started on broad-spectrum IV antibiotics and is slowly improving.  Fevers have now resolved.  The patient has been seen by interventional radiology today with plans for a right tube placement.  Agree with chest tube placement and recommend sending fluid for Gram stain and cultures.  Continue IV antibiotics and narrow based on cultures.  His hemoglobin is currently 8.6.  Recommend close monitoring and transfuse for hemoglobin less than 8.    LOS: 2 days   Mikey Bussing, DNP, AGPCNP-BC, AOCNP 11/10/19

## 2019-11-10 NOTE — TOC Initial Note (Signed)
Transition of Care [facility]) - Initial/Assessment Note    Patient Details  Name: Jeremy [name] MRN: [id number] Date of Birth: 03-30-1946  Transition [facility]) CM/SW Contact:    Dessa Phi, RN Phone Number: 11/10/2019, 2:31 PM  Clinical Narrative:Spoke to spouse-d/c plan return back home w/HHC-Active w/[facility] rep Malachy Mood following HHRN/PT ordered.                   Expected Discharge Plan: [place] Barriers to Discharge: Continued Medical Work up   Patient Goals and CMS Choice Patient states their goals for this hospitalization and ongoing recovery are:: go home CMS Medicare.gov Compare Post Acute Care list provided to:: Patient Represenative (must comment)(spouse) Choice offered to / list presented to : Patient, Spouse  Expected Discharge Plan and Services Expected Discharge Plan: [place]   Discharge Planning Services: CM Consult Post Acute Care Choice: [place] arrangements for the past 2 months: Single Family Home                           HH Arranged: RN, PT [facility] Agency: [place] Date [place]: 11/10/19 Time HH Agency Contacted: 1430 Representative spoke with at [place]: [place] Arrangements/Services Living arrangements for the past 2 months: [place] with:: Spouse Patient language and need for interpreter reviewed:: Yes Do you feel safe going back to the place where you live?: Yes      Need for Family Participation in Patient Care: No (Comment) Care giver support system in place?: Yes (comment) Current home services: DME, Home PT, Home RN(3n1,rw;Active w/[facility] HHRN/PT) Criminal Activity/Legal Involvement Pertinent to Current Situation/Hospitalization: No - Comment as needed  Activities of Daily Living Home Assistive Devices/Equipment: Other (Comment), Walker (specify type), CBG Meter ADL Screening (condition at time of admission) Patient's  cognitive ability adequate to safely complete daily activities?: Yes Is the patient deaf or have difficulty hearing?: No Does the patient have difficulty seeing, even when wearing glasses/contacts?: No Does the patient have difficulty concentrating, remembering, or making decisions?: No Patient able to express need for assistance with ADLs?: Yes Does the patient have difficulty dressing or bathing?: Yes Independently performs ADLs?: No Communication: Independent Dressing (OT): Needs assistance Is this a change from baseline?: Change from baseline, expected to last >3 days Grooming: Needs assistance Is this a change from baseline?: Change from baseline, expected to last >3 days Feeding: Needs assistance Is this a change from baseline?: Change from baseline, expected to last >3 days Bathing: Needs assistance Is this a change from baseline?: Change from baseline, expected to last >3 days Toileting: Needs assistance Is this a change from baseline?: Change from baseline, expected to last >3days In/Out Bed: Needs assistance Is this a change from baseline?: Change from baseline, expected to last >3 days Walks in Home: Needs assistance Is this a change from baseline?: Change from baseline, expected to last >3 days Does the patient have difficulty walking or climbing stairs?: Yes Weakness of Legs: Both Weakness of Arms/Hands: Both  Permission Sought/Granted Permission sought to share information with : Case Manager Permission granted to share information with : Yes, Verbal Permission Granted  Share Information with NAME: Case Manager  Permission granted to share info w AGENCY: [place] granted to share info w Relationship: [name] spouse [phone]     Emotional Assessment Appearance:: Appears stated age Attitude/Demeanor/Rapport: Gracious Affect (typically  observed): Accepting Orientation: : Oriented to Self, Oriented to Place, Oriented to  Time, Oriented to  Situation Alcohol / Substance Use: Not Applicable Psych Involvement: No (comment)  Admission diagnosis:  Pleural effusion [J90] Recurrent right pleural effusion [J90] Loculated pleural effusion [J90] Empyema (HCC) [J86.9] Patient Active Problem List   Diagnosis Date Noted  . Palliative care encounter   . Loculated pleural effusion 11/08/2019  . Acquired thrombophilia ([place]) 11/08/2019  . Port-A-Cath in place 11/05/2019  . Pleural effusion 11/05/2019  . Sepsis due to undetermined organism ([place]) 10/19/2019  . Thrombocytopenia ([place]) 10/19/2019  . Failure to thrive in adult 10/16/2019  . Generalized weakness 10/16/2019  . Drug-induced skin rash 09/23/2019  . 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 Encounter for antineoplastic chemotherapy 08/26/2019  . Encounter for antineoplastic immunotherapy 08/26/2019  . Goals of care, counseling/discussion 08/26/2019  . AF (paroxysmal atrial fibrillation) ([place])   . Protein-calorie malnutrition, severe 08/07/2019  . Hemoptysis 08/06/2019  . Mass of right lung 08/06/2019  . Former smoker 08/06/2019  . Lung mass 08/06/2019  . Postobstructive pneumonia 08/06/2019  . COPD (chronic obstructive pulmonary disease) ([place])   . HTN (hypertension)   . Abnormal weight loss   . Leukocytosis   . Acute and chronic respiratory failure with hypoxia ([place])   . Anemia in neoplastic disease   . Hyperglycemia   . Heme + stool 11/06/2018  . Type 2 diabetes mellitus ([place]) 2018   PCP:  [name], MD Pharmacy:   [facility] [place] [address] [place] [address] Phone: [phone] Fax: [phone]     Social Determinants of Health (SDOH) Interventions    Readmission Risk Interventions Readmission Risk Prevention Plan 11/10/2019 10/20/2019  Transportation Screening Complete Complete  HRI or [place] - Complete  Social Work Consult for [place] Planning/Counseling - Complete   Palliative Care Screening - Not Applicable  Medication Review Press photographer) Complete Complete  HRI or Home Care Consult Complete -  SW Recovery Care/Counseling Consult Complete -  Palliative Care Screening Not Applicable -  [place] Not Applicable -  Some recent data might be hidden

## 2019-11-10 NOTE — Telephone Encounter (Signed)
Pt is admitted for empyema. Appts for labs cancelled for tomorrow.

## 2019-11-10 NOTE — Consult Note (Signed)
Chief Complaint: Loculated pleural effusion with concern for empyema  Referring Physician(s): Dr. Cyndi Bender  Per request from Dr. Prescott Gum  Supervising Physician: Jacqulynn Cadet  Patient Status: [facility] - In-pt  History of Present Illness: Jeremy [name] is a 74 y.o. male History of [place] lung cancer -right.  Admitted for [facility] and subjective fevers found to have a right sided loculated  pleural effusion with concern for empyema.. Team is requesting chest tube right side.   Past Medical History:  Diagnosis Date  . Cancer ([place])    mass right lung  . COPD (chronic obstructive pulmonary disease) (HCC)    QUIT SMOKING 30 YRS AGO  . Diabetes ([place]) 2018  . HTN (hypertension)   . Psoriatic arthritis ([place]) 2015    Past Surgical History:  Procedure Laterality Date  . BACK SURGERY  1990  . BIOPSY  12/30/2018   Procedure: BIOPSY;  Surgeon: Danie Binder, MD;  [facility];  Service: Endoscopy;;  gastric  . CATARACT EXTRACTION Bilateral   . CERVICAL SPINE SURGERY  1995  . COLONOSCOPY WITH PROPOFOL N/A 12/30/2018   Procedure: COLONOSCOPY WITH PROPOFOL;  Surgeon: Danie Binder, MD;  [facility];  Service: Endoscopy;  Laterality: N/A;  12:30pm  . ESOPHAGOGASTRODUODENOSCOPY (EGD) WITH PROPOFOL N/A 12/30/2018   Procedure: ESOPHAGOGASTRODUODENOSCOPY (EGD) WITH PROPOFOL;  Surgeon: Danie Binder, MD;  [facility];  Service: Endoscopy;  Laterality: N/A;  . IR IMAGING GUIDED PORT INSERTION  09/04/2019  . POLYPECTOMY  12/30/2018   Procedure: POLYPECTOMY;  Surgeon: Danie Binder, MD;  [facility];  Service: Endoscopy;;  colon  . THORACENTESIS Right 08/08/2019   Procedure: Thoracentesis;  Surgeon: [name], MD;  Location: [place];  Service: Pulmonary;  Laterality: Right;  Marland Kitchen VIDEO BRONCHOSCOPY Right 08/08/2019   Procedure: Video Bronchoscopy with Erbe Cryo Biopsy of Right mainstem;  Surgeon: [name], MD;  Location: [facility] OR;  Service: Pulmonary;   Laterality: Right;    Allergies: Chlorhexidine and Aleve [naproxen sodium]  Medications: Prior to Admission medications   Medication Sig Start Date End Date Taking? Authorizing Provider  acetaminophen (TYLENOL) 325 MG tablet Take 2 tablets (650 mg total) by mouth every 6 (six) hours as needed for mild pain, fever or headache. 10/28/19  Yes Emokpae, Courage, MD  albuterol (VENTOLIN HFA) 108 (90 Base) MCG/ACT inhaler Inhale 2 puffs into the lungs every 6 (six) hours as needed for wheezing or shortness of breath.   Yes [provider]  diltiazem (CARDIZEM CD) 360 MG 24 hr capsule Take 1 capsule (360 mg total) by mouth daily. 10/29/19  Yes Emokpae, Courage, MD  doxycycline (MONODOX) 50 MG capsule Take 50 mg by mouth 2 (two) times daily as needed (rosacea flare ups.).   Yes [provider]  ferrous sulfate 325 (65 FE) MG tablet Take 325 mg by mouth daily with supper.   Yes [provider]  FLOVENT HFA 110 MCG/ACT inhaler Inhale 1 puff into the lungs 2 (two) times daily.  06/20/19  Yes [provider]  folic acid (FOLVITE) 1 MG tablet Take 1 mg by mouth daily.   Yes [provider]  gabapentin (NEURONTIN) 300 MG capsule Take 600 mg by mouth at bedtime.   Yes [provider]  lidocaine-prilocaine (EMLA) cream Apply to the Port-A-Cath site 30 minutes before treatment 08/26/19  Yes Curt Bears, MD  loratadine (CLARITIN) 10 MG tablet Take 10 mg by mouth daily.  Yes [provider]  metFORMIN (GLUCOPHAGE-XR) 500 MG 24 hr tablet Take 500-1,000 mg by mouth See admin instructions. Take 2 tablets (1000 mg) by mouth in the morning & 1 tablet (500 mg) by mouth at night. 10/15/18  Yes [provider]  metoprolol tartrate (LOPRESSOR) 25 MG tablet Take 1 tablet (25 mg total) by mouth 2 (two) times daily. 10/28/19  Yes Emokpae, Courage, MD  metroNIDAZOLE (METROCREAM) 0.75 % cream Apply 1 application topically 2 (two) times daily as needed  (facial rosacea).    Yes [provider]  mometasone (ELOCON) 0.1 % cream Apply 1 application topically 2 (two) times daily as needed (psoriasis).    Yes [provider]  Omega-3 Fatty Acids (FISH OIL) 1000 MG CAPS Take 1,000 mg by mouth daily.    Yes [provider]  pantoprazole (PROTONIX) 40 MG tablet 1 po 30 mins prior to first meal 12/30/18  Yes Fields, Marga Melnick, MD  prochlorperazine (COMPAZINE) 10 MG tablet Take 1 tablet (10 mg total) by mouth every 6 (six) hours as needed for nausea or vomiting. 08/26/19  Yes Curt Bears, MD  senna-docusate (SENOKOT-S) 8.6-50 MG tablet Take 2 tablets by mouth at bedtime. 10/28/19 10/27/20 Yes Emokpae, Courage, MD  traMADol (ULTRAM) 50 MG tablet Take 50 mg by mouth every 4 (four) hours as needed for moderate pain. for pain 08/19/18  Yes [provider]  triamcinolone lotion (KENALOG) 0.1 % Apply 1 application topically 4 (four) times daily. 09/15/19  Yes Tanner, Lyndon Code., PA-C  umeclidinium-vilanterol (ANORO ELLIPTA) 62.5-25 MCG/INH AEPB Inhale 1 puff into the lungs daily. 08/13/19  Yes Mariel Aloe, MD     Family History  Problem Relation Age of Onset  . Alcoholism Father   . CAD Brother   . Diabetes Brother   . Colon cancer Neg Hx   . Colon polyps Neg Hx   . Gastric cancer Neg Hx     Social History   Socioeconomic History  . Marital status: Married    Spouse name: Not on file  . Number of children: Not on file  . Years of education: Not on file  . Highest education level: Not on file  Occupational History  . Not on file  Tobacco Use  . Smoking status: Former Smoker    Packs/day: 0.50    Years: 50.00    Pack years: 25.00    Types: Cigarettes    Quit date: 12/26/1991    Years since quitting: 27.8  . Smokeless tobacco: Never Used  Substance and Sexual Activity  . Alcohol use: Yes    Comment: occ beer  . Drug use: Never  . Sexual activity: Yes    Birth control/protection: None  Other Topics Concern    . 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 DTOIZTI(4580). 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   Social Determinants of Health   Financial Resource Strain:   . Difficulty of Paying Living Expenses:   Food Insecurity:   . Worried About Charity fundraiser in the Last Year:   . Arboriculturist in the Last Year:   Transportation Needs:   . Film/video editor (Medical):   Marland Kitchen Lack of Transportation (Non-Medical):   Physical Activity:   . Days of Exercise per Week:   . Minutes of Exercise per Session:   Stress:   . Feeling of Stress :   Social Connections:   .  Frequency of Communication with Friends and Family:   . Frequency of Social Gatherings with Friends and Family:   . Attends Religious Services:   . Active Member of Clubs or Organizations:   . Attends Archivist Meetings:   Marland Kitchen Marital Status:     Review of Systems: A 12 point ROS discussed and pertinent positives are indicated in the HPI above.  All other systems are negative.  Review of Systems  Unable to perform ROS: Other (patient drowsy and verbally repsonding to questions but will respond to physical stimuli and follows verbal commands.)    Vital Signs: BP (!) 141/89 (BP Location: Right Arm)   Pulse (!) 105   Temp 97.8 F (36.6 C) (Oral)   Resp 16   Ht 6\' 2"  (1.88 m)   Wt 179 lb 14.3 oz (81.6 kg)   SpO2 95%   BMI 23.10 kg/m   Physical Exam Vitals and nursing note reviewed.  Constitutional:      Appearance: He is well-developed. He is ill-appearing.     Comments: Alert to physical stimuli  HENT:     Head: Normocephalic.  Cardiovascular:     Rate and Rhythm: Normal rate and regular rhythm.     Heart sounds: Normal heart sounds.  Pulmonary:     Effort: Pulmonary effort is normal.     Breath sounds: Rales ( bilateral) present.  Musculoskeletal:     Cervical back: Normal range of motion.  Skin:    General: Skin is dry.      Imaging: DG Chest 2 View  Result Date: 11/08/2019 CLINICAL DATA:  Fever, cough, chills, stage IV lung cancer EXAM: CHEST - 2 VIEW COMPARISON:  10/19/2019, 11/07/2019 FINDINGS: Single frontal view of the chest demonstrates stable right chest wall port. Loculated right pleural effusion unchanged, with compressive atelectasis at the right lung base. No new airspace disease. No pneumothorax. Cardiac silhouette is stable. No acute bony abnormalities. IMPRESSION: 1. Stable loculated right pleural effusion with compressive atelectasis at the right lung base. Electronically Signed   By: Randa Ngo M.D.   On: 11/08/2019 21:16   DG Chest 2 View  Result Date: 10/16/2019 CLINICAL DATA:  Lung cancer and weakness EXAM: CHEST - 2 VIEW COMPARISON:  08/06/2019 CT.  PET-CT 09/08/2019 FINDINGS: Right lower chest opacification with volume loss from necrotic mass by comparison CT. Small to moderate right lateral pleural effusion has increased from prior. Left chest is clear. Stable heart size. Right port with tip at the SVC. IMPRESSION: Necrotic right lung mass as seen on recent staging scan. An overlying right pleural effusion has increased from April. Electronically Signed   By: Monte Fantasia M.D.   On: 10/16/2019 04:43   CT HEAD WO CONTRAST  Result Date: 10/16/2019 CLINICAL DATA:  Mental status change, unknown cause. Additional provided: Weakness, history of lung cancer EXAM: CT HEAD WITHOUT CONTRAST TECHNIQUE: Contiguous axial images were obtained from the base of the skull through the vertex without intravenous contrast. COMPARISON:  Brain MRI 08/10/2019 FINDINGS: Brain: Please note there is limited assessment for intracranial metastatic disease on this noncontrast CT study. Mild ill-defined hypoattenuation within the cerebral white matter is nonspecific, but consistent with chronic small vessel ischemic disease. Stable, mild generalized parenchymal atrophy. There is no acute intracranial hemorrhage. No  demarcated cortical infarct. No extra-axial fluid collection. No evidence of intracranial mass. No midline shift. Vascular: No hyperdense vessel.  Atherosclerotic calcifications Skull: Normal. Negative for fracture or focal lesion. Sinuses/Orbits: Visualized orbits show no acute  finding. Paranasal sinus mucosal thickening, most notable within bilateral ethmoid air cells. No significant mastoid effusion. IMPRESSION: 1. Please note there is limited assessment for intracranial metastatic disease on this non-contrast CT study. 2. No evidence of acute intracranial abnormality. 3. Mild generalized parenchymal atrophy and chronic small vessel ischemic disease. 4. Paranasal sinus mucosal thickening, most notably ethmoid. Electronically Signed   By: Kellie Simmering DO   On: 10/16/2019 09:52   CT Chest W Contrast  Result Date: 11/07/2019 CLINICAL DATA:  Non-small cell lung cancer staging, reportedly post chemo and radiotherapy. Currently on immunotherapy as well. EXAM: CT CHEST, ABDOMEN, AND PELVIS WITH CONTRAST TECHNIQUE: Multidetector CT imaging of the chest, abdomen and pelvis was performed following the standard protocol during bolus administration of intravenous contrast. CONTRAST:  159mL OMNIPAQUE IOHEXOL 300 MG/ML  SOLN COMPARISON:  08/11/2019 FINDINGS: CT CHEST FINDINGS Cardiovascular: RIGHT-sided Port-A-Cath terminates at the caval to atrial junction. Heart size is normal. There is mild pericardial nodularity suggested along the RIGHT heart border on image 45 series 2 with small pericardial effusion and pre pericardial lymph nodes. Pericardial effusion is diminished. Central pulmonary vasculature is unremarkable on venous phase. RIGHT hilum remains encased by soft tissue. Mediastinum/Nodes: No thoracic inlet adenopathy. No axillary lymphadenopathy. Soft tissue in case mint of RIGHT bronchial structures is improved as is soft tissue within the RIGHT mainstem bronchus and RIGHT lower lobe bronchus. Scattered small  mediastinal lymph nodes. Diminished subcarinal nodal tissue largest measuring 1.6 cm short axis previously approximately 3.2 cm short axis. No LEFT hilar nodal tissue. Small lymph node adjacent to the distal esophagus and medial to markedly improved masslike area in the RIGHT lower lobe measures 1 cm (image 44, series 2) Lungs/Pleura: Increased size of loculated pleural fluid in the RIGHT chest. Rim of enhancement has developed. Pleural fluid in the RIGHT chest measuring 17 x 9 cm on image 40 of series 2. Diminished size of the dominant masslike area in the RIGHT lower lobe. In an area that may simply represent treated tumor in collapsed lung measuring 6.2 x 4.3 cm corresponding to the necrotic appearing mass in the inferior RIGHT chest on the previous CT from August 06, 2019 that measured 10 x 8.5 cm. Margins are no longer bulging and rounded. Volume loss in the setting of enlarging pleural fluid in the RIGHT lower lobe. Diminished nodularity throughout the RIGHT upper lobe. New area of nodularity in the LEFT lung base (image 90, series 3) 1.8 cm x 0.8 cm. No pleural effusion in the LEFT chest. Motion limited assessment at the LEFT lung base. Musculoskeletal: No sign of chest wall mass. See below for full musculoskeletal details. CT ABDOMEN PELVIS FINDINGS Hepatobiliary: No focal suspicious hepatic lesion. No biliary ductal dilation. No pericholecystic stranding. Portal vein is patent. Pancreas: Pancreas is normal without ductal dilation, inflammation or pancreatic lesion. Spleen: Spleen normal size without focal lesion. Adrenals/Urinary Tract: Adrenal glands are normal. Kidneys enhance symmetrically without sign of focal lesion or evidence of hydronephrosis. Stomach/Bowel: No acute gastrointestinal process. Colonic diverticulosis without sign of diverticulitis. Vascular/Lymphatic: Calcified atheromatous plaque of the abdominal aorta. No aneurysm. No adenopathy in the retroperitoneum. No retrocrural lymphadenopathy.  No pelvic lymphadenopathy. Reproductive: Prostate unremarkable by CT. Other: No abdominal wall hernia or abnormality. No abdominopelvic ascites. Musculoskeletal: Spinal degenerative changes. No acute or destructive bone process. IMPRESSION: 1. Increased size of loculated pleural fluid in the RIGHT chest with new rim of enhancement. Findings may represent enlarging reactive effusion following radiation. Correlate with any clinical signs of infection  as empyema could have a similar appearance. Given size of effusion would also correlate with any worsening shortness of breath. No gas present within the fluid. 2. Volume loss in the RIGHT chest increasing as the masslike area in the medial RIGHT chest as diminished in size as described. Also with resolution of upper lobe nodules. 3. Motion limited assessment of lung parenchyma but with new area of nodularity at the LEFT lung base, consider close attention on short interval follow-up. 4. Diminished size of mediastinal lymph nodes within the subcarinal region in particular. 5. No evidence of metastatic disease in the abdomen or pelvis. 6. Aortic atherosclerosis. Aortic Atherosclerosis (ICD10-I70.0). Electronically Signed   By: Zetta Bills M.D.   On: 11/07/2019 15:55   CT Abdomen Pelvis W Contrast  Result Date: 11/07/2019 CLINICAL DATA:  Non-small cell lung cancer staging, reportedly post chemo and radiotherapy. Currently on immunotherapy as well. EXAM: CT CHEST, ABDOMEN, AND PELVIS WITH CONTRAST TECHNIQUE: Multidetector CT imaging of the chest, abdomen and pelvis was performed following the standard protocol during bolus administration of intravenous contrast. CONTRAST:  148mL OMNIPAQUE IOHEXOL 300 MG/ML  SOLN COMPARISON:  08/11/2019 FINDINGS: CT CHEST FINDINGS Cardiovascular: RIGHT-sided Port-A-Cath terminates at the caval to atrial junction. Heart size is normal. There is mild pericardial nodularity suggested along the RIGHT heart border on image 45 series 2 with  small pericardial effusion and pre pericardial lymph nodes. Pericardial effusion is diminished. Central pulmonary vasculature is unremarkable on venous phase. RIGHT hilum remains encased by soft tissue. Mediastinum/Nodes: No thoracic inlet adenopathy. No axillary lymphadenopathy. Soft tissue in case mint of RIGHT bronchial structures is improved as is soft tissue within the RIGHT mainstem bronchus and RIGHT lower lobe bronchus. Scattered small mediastinal lymph nodes. Diminished subcarinal nodal tissue largest measuring 1.6 cm short axis previously approximately 3.2 cm short axis. No LEFT hilar nodal tissue. Small lymph node adjacent to the distal esophagus and medial to markedly improved masslike area in the RIGHT lower lobe measures 1 cm (image 44, series 2) Lungs/Pleura: Increased size of loculated pleural fluid in the RIGHT chest. Rim of enhancement has developed. Pleural fluid in the RIGHT chest measuring 17 x 9 cm on image 40 of series 2. Diminished size of the dominant masslike area in the RIGHT lower lobe. In an area that may simply represent treated tumor in collapsed lung measuring 6.2 x 4.3 cm corresponding to the necrotic appearing mass in the inferior RIGHT chest on the previous CT from August 06, 2019 that measured 10 x 8.5 cm. Margins are no longer bulging and rounded. Volume loss in the setting of enlarging pleural fluid in the RIGHT lower lobe. Diminished nodularity throughout the RIGHT upper lobe. New area of nodularity in the LEFT lung base (image 90, series 3) 1.8 cm x 0.8 cm. No pleural effusion in the LEFT chest. Motion limited assessment at the LEFT lung base. Musculoskeletal: No sign of chest wall mass. See below for full musculoskeletal details. CT ABDOMEN PELVIS FINDINGS Hepatobiliary: No focal suspicious hepatic lesion. No biliary ductal dilation. No pericholecystic stranding. Portal vein is patent. Pancreas: Pancreas is normal without ductal dilation, inflammation or pancreatic lesion.  Spleen: Spleen normal size without focal lesion. Adrenals/Urinary Tract: Adrenal glands are normal. Kidneys enhance symmetrically without sign of focal lesion or evidence of hydronephrosis. Stomach/Bowel: No acute gastrointestinal process. Colonic diverticulosis without sign of diverticulitis. Vascular/Lymphatic: Calcified atheromatous plaque of the abdominal aorta. No aneurysm. No adenopathy in the retroperitoneum. No retrocrural lymphadenopathy. No pelvic lymphadenopathy. Reproductive: Prostate unremarkable by  CT. Other: No abdominal wall hernia or abnormality. No abdominopelvic ascites. Musculoskeletal: Spinal degenerative changes. No acute or destructive bone process. IMPRESSION: 1. Increased size of loculated pleural fluid in the RIGHT chest with new rim of enhancement. Findings may represent enlarging reactive effusion following radiation. Correlate with any clinical signs of infection as empyema could have a similar appearance. Given size of effusion would also correlate with any worsening shortness of breath. No gas present within the fluid. 2. Volume loss in the RIGHT chest increasing as the masslike area in the medial RIGHT chest as diminished in size as described. Also with resolution of upper lobe nodules. 3. Motion limited assessment of lung parenchyma but with new area of nodularity at the LEFT lung base, consider close attention on short interval follow-up. 4. Diminished size of mediastinal lymph nodes within the subcarinal region in particular. 5. No evidence of metastatic disease in the abdomen or pelvis. 6. Aortic atherosclerosis. Aortic Atherosclerosis (ICD10-I70.0). Electronically Signed   By: Zetta Bills M.D.   On: 11/07/2019 15:55   DG Chest Port 1 View  Result Date: 10/19/2019 CLINICAL DATA:  Fever and urinary complaints EXAM: PORTABLE CHEST 1 VIEW COMPARISON:  Radiograph 10/16/2019 FINDINGS: Likely increasing size of the complex right pleural effusion with associated passive  atelectasis though some underlying consolidation is not excluded. A necrotic lung mass is again seen in the right middle lobe, better visualized on cross-sectional imaging. Left lung is predominantly clear aside from basilar atelectasis. Left apical scarring as well. Cardiomediastinal contours are unchanged from prior, partially obscured in the right lung base by overlying opacity. Right IJ Port-A-Cath tip terminates at the superior cavoatrial junction. Telemetry leads overlie the chest. Degenerative changes are present in the imaged spine and shoulders. IMPRESSION: 1. Likely increasing size of the complex right pleural effusion with associated passive atelectasis though some underlying consolidation is not excluded in the setting of fever. 2. Necrotic right lung mass, better visualized on cross-sectional imaging. Electronically Signed   By: Lovena Le M.D.   On: 10/19/2019 17:39   DG HIP UNILAT WITH PELVIS 2-3 VIEWS RIGHT  Result Date: 10/18/2019 CLINICAL DATA:  Right hip pain for 5 days following fall, initial encounter EXAM: DG HIP (WITH OR WITHOUT PELVIS) 2-3V RIGHT COMPARISON:  None. 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 No acute fracture or dislocation is seen. No soft tissue abnormality is noted. IMPRESSION: Degenerative change without acute abnormality. Electronically Signed   By: Inez Catalina M.D.   On: 10/18/2019 12:21    Labs:  CBC: Recent Labs    10/28/19 0629 10/28/19 0629 11/05/19 0942 11/08/19 2023 11/08/19 2107 11/10/19 0224  WBC 4.0  --  8.8 8.7  --  7.9  HGB 8.1*  --  7.8* 9.7* 10.2* 8.6*  HCT 27.0*   < > 25.9* 32.6* 30.0* 28.4*  PLT 117*  --  288 346  --  274   < > = values in this interval not displayed.    COAGS: Recent Labs    08/07/19 1927 10/19/19 1720 10/19/19 1750 11/08/19 2023  INR 1.1 1.0  --  1.1  APTT 33  --  34  --     BMP: Recent Labs    10/26/19 0721 10/26/19 0721 11/05/19 0942 11/08/19 2023 11/08/19 2107  11/10/19 0224  NA 134*   < > 140 139 139 135  K 3.8   < > 4.3 3.7 3.7 3.1*  CL 96*   < > 100 97* 98  97*  CO2 28  --  28 26  --  28  GLUCOSE 124*   < > 154* 188* 193* 109*  BUN 11   < > 14 16 14 12   CALCIUM 7.7*  --  9.0 8.1*  --  7.8*  CREATININE 0.66  --  0.76 0.90 0.80 0.60*  GFRNONAA >60  --  >60 >60  --  >60  GFRAA >60  --  >60 >60  --  >60   < > = values in this interval not displayed.    LIVER FUNCTION TESTS: Recent Labs    10/21/19 0541 10/22/19 0434 10/26/19 0721 11/05/19 0942 11/08/19 2023 11/09/19 1559  BILITOT 0.8  --   --  0.4 0.6 0.7  AST 19  --   --  17 22 16   ALT 21  --   --  20 23 20   ALKPHOS 51  --   --  67 68 59  PROT 6.3*  --   --  8.1 8.4* 7.5  ALBUMIN 2.0*   < > 2.3* 1.7* 2.2* 1.9*   < > = values in this interval not displayed.    Assessment and Plan:  74 y.o, male inpatient. History of [place] lung cancer -right.  Admitted for [facility] and subjective fevers found to have a right sided loculated  pleural effusion with concern for empyema.. Team is requesting chest tube right side.  Pertinent Imaging 6.5.21 - Chest xray shows right side loculated pleural effusion  Pertinent IR History 4.1.21 - Port placement - right  Pertinent Allergies Chlorhexidine  Cr 0.6 All labs and medications are within acceptable parameters.  Patient is afebrile. Covid negative on 6.5.21  Risks and benefits discussed with the patient including bleeding, infection, damage to adjacent structures, bowel perforation/fistula connection, and sepsis.  All of the patient's questions were answered, patient is agreeable to proceed. Consent signed and in chart.    Thank you for this interesting consult.  I greatly enjoyed meeting BRANKO STEEVES and look forward to participating in their care.  A copy of this report was sent to the requesting provider on this date.  Electronically Signed: Avel Peace, NP 11/10/2019, 11:40 AM   I spent a total of 40 Minutes    in face to  face in clinical consultation, greater than 50% of which was counseling/coordinating care for chest tube placement - right side

## 2019-11-10 NOTE — Progress Notes (Signed)
Patient ID: Jeremy [name], male   DOB: [birth year], 74 y.o.   MRN: [id number]  PROGRESS NOTE    Jeremy [name]  IEP:[id number] DOB: 21-Mar-1946 DOA: 11/08/2019 PCP: [name], MD   Brief Narrative:  74 year old male with history of stage IV lung cancer status post palliative radiotherapy and now on chemotherapy, COPD with chronic hypoxic respiratory failure, paroxysmal A. fib not on anticoagulation, chronic anemia requiring intermittent transfusions, recent admission with sepsis secondary to pneumonia and treated with antibiotics presented on 11/08/2019 with fevers, right-sided chest pain and cough.  In the ED, he was tachycardic and chest x-ray revealed stable loculated right pleural effusion with compressive atelectasis.  Outpatient CT of the chest was concerning for increasing right pleural effusion with question of possible empyema.  Patient was started on broad-spectrum antibiotics and IR was consulted for possible ultrasound-guided thoracentesis.  Assessment & Plan:   Right pleural effusion, suspect parapneumonic with concern for empyema -Presented with fevers and cough with outpatient CT concerning for increasing right pleural effusion with question of possible empyema -Currently on broad-spectrum antibiotics.  Follow cultures. -Patient was supposed to have ultrasound-guided thoracentesis by IR on 11/09/2019 but he could not sit up for the procedure and requested that the procedure be done today. -Continue broad-spectrum antibiotics in the meantime. -Cardiothoracic surgery evaluation by Dr. Darcey Nora appreciated: Recommends IR guided pigtail placement.  IR has been notified regarding the same. -Currently requiring 2 L oxygen via nasal cannula.  Stage IV lung cancer -Status post palliative radiotherapy and currently on chemotherapy -Follows up with Dr. Julien Nordmann as an outpatient -Oncology team has been added to care teams -Palliative care evaluation appreciated.  Overall prognosis is guarded to  poor -Patient has chronic skin rash which started after he was given Keytruda.  Subsequently [name] has been discontinued.  Chronic hypoxic respiratory failure COPD -Uses supplemental oxygen at home via nasal cannula 2 L/min intermittently. -Currently not wheezing.  Continue supplemental oxygen and nebs.  Anemia of chronic disease -Hemoglobin stable.  Monitor  Paroxysmal A. Fib -Currently intermittently tachycardic.  No anticoagulation because of transfusion dependent anemia - continue diltiazem and metoprolol  Hypokalemia -Replace.  Repeat a.m. labs  Hypomagnesemia -Replace.  Repeat a.m. labs  Diabetes mellitus type 2 -A1c was 7 in March 2021 - continue CBGs with SSI  Generalized conditioning -will need PT evaluation  DVT prophylaxis: SCDs Code Status: DNR Family Communication: Wife at bedside on 11/09/2019 Disposition Plan: Status is: Inpatient  Remains inpatient appropriate because:Inpatient level of care appropriate due to severity of illness.  Currently on IV antibiotics and will need ultrasound-guided thoracentesis versus pigtail placement.   Dispo: The patient is from: Home              Anticipated d/c is to: Home              Anticipated d/c date is: > 3 days              Patient currently is not medically stable to d/c.  Consultants: IR.  Spoke to Dr. Vantrigt/CT surgery on phone on 11/08/2019.  Palliative care  Procedures: None  Antimicrobials: Cefepime, Flagyl and vancomycin from 11/08/2019 onwards   Subjective: Patient seen and examined at bedside.  Does not feel well.  Still feels short of breath with minimal exertion.  No overnight fever or vomiting reported.  Objective: Vitals:   11/09/19 2007 11/09/19 2242 11/09/19 2315 11/10/19 0349  BP: (!) 148/85 (!) 142/94 140/86 125/71  Pulse: (!) 106 (!) 103  92 (!) 101  Resp: (!) 26 (!) 21 (!) 21 20  Temp: 98.5 F (36.9 C) 98.3 F (36.8 C) 98.4 F (36.9 C) 98.4 F (36.9 C)  TempSrc: Oral Oral Oral Oral    SpO2: 94% 92% 93% 94%  Weight:      Height:        Intake/Output Summary (Last 24 hours) at 11/10/2019 0758 Last data filed at 11/10/2019 0500 Gross per 24 hour  Intake 1140 ml  Output 1300 ml  Net -160 ml   Filed Weights   11/09/19 0500  Weight: 81.6 kg    Examination:  General exam: No distress.  Looks chronically ill. Respiratory system: Bilateral decreased breath sounds at bases with scattered crackles.  Tachypneic  cardiovascular system: Tachycardic, S1-S2 heard Gastrointestinal system: Abdomen is nondistended, soft and nontender.  Bowel sounds are heard  extremities: Bilateral lower extremity edema present.  No cyanosis or clubbing. Central nervous system: Awake and alert.  Poor historian.  Still slow to respond.  No focal neurological deficits. Moving extremities Skin: Erythematous plaques involving upper and lower extremities, unchanged; chronic, Keytruda induced as per patient/wife Psychiatry: Looks anxious    Data Reviewed: I have personally reviewed following labs and imaging studies  CBC: Recent Labs  Lab 11/05/19 0942 11/08/19 2023 11/08/19 2107 11/10/19 0224  WBC 8.8 8.7  --  7.9  NEUTROABS 7.1 6.8  --   --   HGB 7.8* 9.7* 10.2* 8.6*  HCT 25.9* 32.6* 30.0* 28.4*  MCV 94.2 92.1  --  89.9  PLT 288 346  --  009   Basic Metabolic Panel: Recent Labs  Lab 11/05/19 0942 11/08/19 2023 11/08/19 2107 11/10/19 0224  NA 140 139 139 135  K 4.3 3.7 3.7 3.1*  CL 100 97* 98 97*  CO2 28 26  --  28  GLUCOSE 154* 188* 193* 109*  BUN 14 16 14 12   CREATININE 0.76 0.90 0.80 0.60*  CALCIUM 9.0 8.1*  --  7.8*  MG  --   --   --  1.4*   GFR: Estimated Creatinine Clearance: 92.1 mL/min (A) (by C-G formula based on SCr of 0.6 mg/dL (L)). Liver Function Tests: Recent Labs  Lab 11/05/19 0942 11/08/19 2023 11/09/19 1559  AST 17 22 16   ALT 20 23 20   ALKPHOS 67 68 59  BILITOT 0.4 0.6 0.7  PROT 8.1 8.4* 7.5  ALBUMIN 1.7* 2.2* 1.9*   No results for input(s):  LIPASE, AMYLASE in the last 168 hours. No results for input(s): AMMONIA in the last 168 hours. Coagulation Profile: Recent Labs  Lab 11/08/19 2023  INR 1.1   Cardiac Enzymes: No results for input(s): CKTOTAL, CKMB, CKMBINDEX, TROPONINI in the last 168 hours. BNP (last 3 results) No results for input(s): PROBNP in the last 8760 hours. HbA1C: Recent Labs    11/09/19 1559  HGBA1C 7.0*   CBG: Recent Labs  Lab 11/09/19 1235 11/09/19 1607 11/09/19 2024 11/09/19 2317 11/10/19 0352  GLUCAP 100* 164* 127* 100* 103*   Lipid Profile: No results for input(s): CHOL, HDL, LDLCALC, TRIG, CHOLHDL, LDLDIRECT in the last 72 hours. Thyroid Function Tests: No results for input(s): TSH, T4TOTAL, FREET4, T3FREE, THYROIDAB in the last 72 hours. Anemia Panel: No results for input(s): VITAMINB12, FOLATE, FERRITIN, TIBC, IRON, RETICCTPCT in the last 72 hours. Sepsis Labs: Recent Labs  Lab 11/08/19 0118 11/08/19 2023  LATICACIDVEN 0.8 2.0*    Recent Results (from the past 240 hour(s))  SARS CORONAVIRUS 2 (TAT 6-24 HRS) Nasopharyngeal  Nasopharyngeal Swab     Status: None   Collection Time: 11/08/19  1:32 PM   Specimen: Nasopharyngeal Swab  Result Value Ref Range Status   SARS Coronavirus 2 NEGATIVE NEGATIVE Final    Comment: (NOTE) SARS-CoV-2 target nucleic acids are NOT DETECTED. The SARS-CoV-2 RNA is generally detectable in upper and lower respiratory specimens during the acute phase of infection. Negative results do not preclude SARS-CoV-2 infection, do not rule out co-infections with other pathogens, and should not be used as the sole basis for treatment or other patient management decisions. Negative results must be combined with clinical observations, patient history, and epidemiological information. The expected result is Negative. Fact Sheet for Patients: SugarRoll.be Fact Sheet for Healthcare  Providers: https://www.woods-mathews.com/ This test is not yet approved or cleared by the [place] FDA and  has been authorized for detection and/or diagnosis of SARS-CoV-2 by FDA under an Emergency Use Authorization (EUA). This EUA will remain  in effect (meaning this test can be used) for the duration of the COVID-19 declaration under Section 56 4(b)(1) of the Act, 21 U.S.C. section 360bbb-3(b)(1), unless the authorization is terminated or revoked sooner. Performed at [facility], [place] [address], [place], [place] [address]   Culture, blood (Routine x 2)     Status: None (Preliminary result)   Collection Time: 11/08/19  8:23 PM   Specimen: BLOOD  Result Value Ref Range Status   Specimen Description   Final    BLOOD RIGHT ANTECUBITAL Performed at [facility] [address] [place], [place] [address]    Special Requests   Final    BOTTLES DRAWN AEROBIC AND ANAEROBIC Blood Culture adequate volume Performed at [facility] [address], [place], [place] [address]    Culture   Final    NO GROWTH 1 DAY Performed at [facility], [place] [address], [place], [place] [address]    Report Status PENDING  Incomplete  Culture, blood (Routine x 2)     Status: None (Preliminary result)   Collection Time: 11/08/19  8:28 PM   Specimen: BLOOD  Result Value Ref Range Status   Specimen Description   Final    BLOOD LEFT ANTECUBITAL Performed at [facility] [address], [place], [place] [address]    Special Requests   Final    BOTTLES DRAWN AEROBIC AND ANAEROBIC Blood Culture adequate volume Performed at [facility] [address], [place],  [address]    Culture   Final    NO GROWTH 1 DAY Performed at [facility], [place] [address] [place],  [address]    Report Status PENDING  Incomplete  MRSA PCR Screening     Status: None   Collection Time: 11/08/19 10:50  PM   Specimen: Nasal Mucosa; Nasopharyngeal  Result Value Ref Range Status   MRSA by PCR NEGATIVE NEGATIVE Final    Comment:        The GeneXpert MRSA Assay (FDA approved for NASAL specimens only), is one component of a comprehensive MRSA colonization surveillance program. It is not intended to diagnose MRSA infection nor to guide or monitor treatment for MRSA infections. Performed at [facility], [place] [address], [place],  [address]          Radiology Studies: DG Chest 2 View  Result Date: 11/08/2019 CLINICAL DATA:  Fever, cough, chills, stage IV lung cancer EXAM: CHEST - 2 VIEW COMPARISON:  10/19/2019, 11/07/2019  FINDINGS: Single frontal view of the chest demonstrates stable right chest wall port. Loculated right pleural effusion unchanged, with compressive atelectasis at the right lung base. No new airspace disease. No pneumothorax. Cardiac silhouette is stable. No acute bony abnormalities. IMPRESSION: 1. Stable loculated right pleural effusion with compressive atelectasis at the right lung base. Electronically Signed   By: Randa Ngo M.D.   On: 11/08/2019 21:16        Scheduled Meds: . budesonide (PULMICORT) nebulizer solution  0.25 mg Nebulization BID  . diltiazem  360 mg Oral Daily  . feeding supplement (ENSURE ENLIVE)  237 mL Oral BID BM  . folic acid  1 mg Oral Daily  . gabapentin  600 mg Oral QHS  . insulin aspart  0-9 Units Subcutaneous Q4H  . metoprolol tartrate  25 mg Oral BID  . pantoprazole  40 mg Oral BID AC  . senna-docusate  2 tablet Oral QHS  . sodium chloride flush  10-40 mL Intracatheter Q12H  . sodium chloride flush  3 mL Intravenous Q12H  . sodium chloride flush  3 mL Intravenous Q12H  . tamsulosin  0.4 mg Oral Daily  . triamcinolone cream   Topical Daily  . umeclidinium-vilanterol  1 puff Inhalation Daily   Continuous Infusions: . sodium chloride    . ceFEPime (MAXIPIME) IV 2 g (11/10/19 3825)  . metronidazole 500  mg (11/10/19 0557)  . vancomycin 1,250 mg (11/10/19 0008)          Aline August, MD [facility] 11/10/2019, 7:58 AM

## 2019-11-10 NOTE — Progress Notes (Signed)
   11/09/19 2007  Assess: MEWS Score  Temp 98.5 F (36.9 C)  BP (!) 148/85  Pulse Rate (!) 106  Resp (!) 26  SpO2 94 %  Assess: MEWS Score  MEWS Temp 0  MEWS Systolic 0  MEWS Pulse 1  MEWS RR 2  MEWS LOC 0  MEWS Score 3  MEWS Score Color Yellow  Assess: if the MEWS score is Yellow or Red  Were vital signs taken at a resting state? Yes  Focused Assessment Documented focused assessment  Early Detection of Sepsis Score *See Row Information* Low  MEWS guidelines implemented *See Row Information* Yes  Treat  MEWS Interventions Administered scheduled meds/treatments;Administered prn meds/treatments  Take Vital Signs  Increase Vital Sign Frequency  Yellow: Q 2hr X 2 then Q 4hr X 2, if remains yellow, continue Q 4hrs  Escalate  MEWS: Escalate Yellow: discuss with charge nurse/RN and consider discussing with provider and RRT  Notify: Charge Nurse/RN  Name of Charge Nurse/RN Notified Kenna Gilbert RN   Date Charge Nurse/RN Notified 11/10/19  Time Charge Nurse/RN Notified 2010  Pt restless and agitated in bed. Repositioned with pillows and PRNs given, see MAR. Will follow yellow MEWS guidelines.

## 2019-11-10 NOTE — Procedures (Signed)
Interventional Radiology Procedure Note  Procedure: Placement of a 85F right-sided pigtail chest tube.   Complications: None  Estimated Blood Loss: None  Recommendations: - Chest tube to wall suction via Pleur-evac   Signed,  Criselda Peaches, MD

## 2019-11-10 NOTE — Evaluation (Signed)
Physical Therapy Evaluation Patient Details Name: Jeremy [name] MRN: [id number] DOB: 01-02-1946 Today's Date: 11/10/2019   History of Present Illness  74 year old male with history of stage IV lung cancer status post palliative radiotherapy and now on chemotherapy, COPD with chronic hypoxic respiratory failure, paroxysmal A. fib not on anticoagulation, chronic anemia requiring intermittent transfusions, recent admission with sepsis secondary to pneumonia and treated with antibiotics presented on 11/08/2019 with fevers, right-sided chest pain and cough.  In the ED, he was tachycardic and chest x-ray revealed stable loculated right pleural effusion with compressive atelectasis.  Outpatient CT of the chest was concerning for increasing right pleural effusion with question of possible empyema.  Patient was started on broad-spectrum antibiotics and IR was consulted for possible ultrasound-guided thoracentesis.  Pt for possible drain placement  Clinical Impression  Pt admitted with above diagnosis.  Pt currently with functional limitations due to the deficits listed below (see PT Problem List). Pt will benefit from skilled PT to increase their independence and safety with mobility to allow discharge to the venue listed below.  Pt assisted with ambulating in hallway and only tolerated short distance.  Pt with a couple recent admissions and had started HHPT prior to this admission.  Spouse reports pt has all DME.  Recommend continuing with HHPT upon d/c.     Follow Up Recommendations Home health PT(pt had been evaluated by HHPT and then admitted)    Equipment Recommendations  None recommended by PT    Recommendations for Other Services       Precautions / Restrictions Precautions Precautions: Fall Precaution Comments: pt reports oxygen as needed at home      Mobility  Bed Mobility Overal bed mobility: Needs Assistance Bed Mobility: Supine to Sit;Sit to Supine     Supine to sit: Min guard Sit to  supine: Min guard   General bed mobility comments: min/guard for safety and lines  Transfers Overall transfer level: Needs assistance Equipment used: None Transfers: Sit to/from Stand Sit to Stand: Min guard         General transfer comment: min/guard for safety  Ambulation/Gait Ambulation/Gait assistance: Min guard Gait Distance (Feet): 100 Feet Assistive device: IV Pole Gait Pattern/deviations: Step-through pattern;Decreased stride length     General Gait Details: pt utilized IV pole for support, distance to tolerance, pt ambulated without oxygen and then reapplied once back in bed (wears as needed at home)  Science writer    Modified Rankin (Stroke Patients Only)       Balance           Standing balance support: No upper extremity supported Standing balance-Leahy Scale: Fair                               Pertinent Vitals/Pain Pain Assessment: No/denies pain    Home Living Family/patient expects to be discharged to:: Private residence Living Arrangements: Spouse/significant other Available Help at Discharge: Family Type of Home: House Home Access: Stairs to enter Entrance Stairs-Rails: Right Entrance Stairs-Number of Steps: 2 Home Layout: One level Home Equipment: Cane - single point;Shower seat - built in;Grab bars - tub/shower;Toilet riser;Other (comment) Additional Comments: on home O2    Prior Function Level of Independence: Needs assistance   Gait / Transfers Assistance Needed: ambulatory in household without assistive device  ADL's / Homemaking Assistance Needed: mostly independent, wife assists occasionally  Hand Dominance        Extremity/Trunk Assessment        Lower Extremity Assessment Lower Extremity Assessment: Generalized weakness    Cervical / Trunk Assessment Cervical / Trunk Assessment: Normal  Communication   Communication: No difficulties  Cognition  Arousal/Alertness: Awake/alert Behavior During Therapy: WFL for tasks assessed/performed Overall Cognitive Status: Within Functional Limits for tasks assessed                                        General Comments      Exercises     Assessment/Plan    PT Assessment Patient needs continued PT services  PT Problem List Decreased strength;Decreased activity tolerance;Decreased balance;Decreased mobility;Decreased knowledge of use of DME       PT Treatment Interventions DME instruction;Gait training;Stair training;Functional mobility training;Therapeutic activities;Therapeutic exercise;Balance training;Patient/family education;Neuromuscular re-education    PT Goals (Current goals can be found in the Care Plan section)  Acute Rehab PT Goals PT Goal Formulation: With patient Time For Goal Achievement: 11/24/19 Potential to Achieve Goals: Good    Frequency Min 3X/week   Barriers to discharge        Co-evaluation               AM-PAC PT "6 Clicks" Mobility  Outcome Measure Help needed turning from your back to your side while in a flat bed without using bedrails?: None Help needed moving from lying on your back to sitting on the side of a flat bed without using bedrails?: A Little Help needed moving to and from a bed to a chair (including a wheelchair)?: A Little Help needed standing up from a chair using your arms (e.g., wheelchair or bedside chair)?: A Little Help needed to walk in hospital room?: A Little Help needed climbing 3-5 steps with a railing? : A Little 6 Click Score: 19    End of Session Equipment Utilized During Treatment: Gait belt Activity Tolerance: Patient tolerated treatment well Patient left: with call bell/phone within reach;in bed;with bed alarm set;with family/visitor present   PT Visit Diagnosis: Difficulty in walking, not elsewhere classified (R26.2);Muscle weakness (generalized) (M62.81)    Time: 1245-8099 PT Time  Calculation (min) (ACUTE ONLY): 19 min   Jannette Spanner PT, DPT Acute Rehabilitation Services Office: [phone]  Trena Platt 11/10/2019, 12:58 PM

## 2019-11-11 ENCOUNTER — Inpatient Hospital Stay: Payer: Medicare Other

## 2019-11-11 DIAGNOSIS — Z515 Encounter for palliative care: Secondary | ICD-10-CM

## 2019-11-11 DIAGNOSIS — I10 Essential (primary) hypertension: Secondary | ICD-10-CM

## 2019-11-11 DIAGNOSIS — Z7189 Other specified counseling: Secondary | ICD-10-CM

## 2019-11-11 DIAGNOSIS — J439 Emphysema, unspecified: Secondary | ICD-10-CM

## 2019-11-11 DIAGNOSIS — J90 Pleural effusion, not elsewhere classified: Secondary | ICD-10-CM

## 2019-11-11 LAB — LEGIONELLA PNEUMOPHILA SEROGP 1 UR AG: L. pneumophila Serogp 1 Ur Ag: NEGATIVE

## 2019-11-11 LAB — CBC
HCT: 29.6 % — ABNORMAL LOW (ref 39.0–52.0)
Hemoglobin: 8.9 g/dL — ABNORMAL LOW (ref 13.0–17.0)
MCH: 27.8 pg (ref 26.0–34.0)
MCHC: 30.1 g/dL (ref 30.0–36.0)
MCV: 92.5 fL (ref 80.0–100.0)
Platelets: 290 10*3/uL (ref 150–400)
RBC: 3.2 MIL/uL — ABNORMAL LOW (ref 4.22–5.81)
RDW: 17.1 % — ABNORMAL HIGH (ref 11.5–15.5)
WBC: 7.7 10*3/uL (ref 4.0–10.5)
nRBC: 0 % (ref 0.0–0.2)

## 2019-11-11 LAB — URINE CULTURE: Culture: 30000 — AB

## 2019-11-11 LAB — BASIC METABOLIC PANEL
Anion gap: 12 (ref 5–15)
BUN: 11 mg/dL (ref 8–23)
CO2: 23 mmol/L (ref 22–32)
Calcium: 7.8 mg/dL — ABNORMAL LOW (ref 8.9–10.3)
Chloride: 101 mmol/L (ref 98–111)
Creatinine, Ser: 0.54 mg/dL — ABNORMAL LOW (ref 0.61–1.24)
GFR calc Af Amer: >60 mL/min (ref >60)
GFR calc non Af Amer: >60 mL/min (ref >60)
Glucose, Bld: 80 mg/dL (ref 70–99)
Potassium: 3.5 mmol/L (ref 3.5–5.1)
Sodium: 136 mmol/L (ref 135–145)

## 2019-11-11 LAB — GLUCOSE, CAPILLARY
Glucose-Capillary: 122 mg/dL — ABNORMAL HIGH (ref 70–99)
Glucose-Capillary: 153 mg/dL — ABNORMAL HIGH (ref 70–99)
Glucose-Capillary: 194 mg/dL — ABNORMAL HIGH (ref 70–99)
Glucose-Capillary: 253 mg/dL — ABNORMAL HIGH (ref 70–99)
Glucose-Capillary: 76 mg/dL (ref 70–99)
Glucose-Capillary: 88 mg/dL (ref 70–99)

## 2019-11-11 LAB — MAGNESIUM: Magnesium: 1.5 mg/dL — ABNORMAL LOW (ref 1.7–2.4)

## 2019-11-11 MED ORDER — MAGNESIUM SULFATE 2 GM/50ML IV SOLN
2.0000 g | Freq: Once | INTRAVENOUS | Status: AC
  Administered 2019-11-11: 2 g via INTRAVENOUS
  Filled 2019-11-11: qty 50

## 2019-11-11 MED ORDER — TRAZODONE HCL 50 MG PO TABS
25.0000 mg | ORAL_TABLET | Freq: Every evening | ORAL | Status: DC | PRN
  Administered 2019-11-12 – 2019-11-17 (×6): 25 mg via ORAL
  Filled 2019-11-11 (×6): qty 1

## 2019-11-11 MED ORDER — MORPHINE SULFATE (PF) 2 MG/ML IV SOLN
2.0000 mg | INTRAVENOUS | Status: DC | PRN
  Administered 2019-11-11 – 2019-11-15 (×7): 2 mg via INTRAVENOUS
  Filled 2019-11-11 (×7): qty 1

## 2019-11-11 MED ORDER — OXYCODONE HCL 5 MG PO TABS
5.0000 mg | ORAL_TABLET | Freq: Four times a day (QID) | ORAL | Status: DC | PRN
  Administered 2019-11-13 – 2019-11-17 (×10): 5 mg via ORAL
  Filled 2019-11-11 (×10): qty 1

## 2019-11-11 NOTE — Care Management Important Message (Signed)
Important Message  Patient Details IM Letter given to Roque Lias SW Case Manager to present to the Patient Name: Jeremy [name] MRN: [id number] Date of Birth: 09-09-1945   Medicare Important Message Given:  Yes     Kerin Salen 11/11/2019, 10:45 AM

## 2019-11-11 NOTE — Progress Notes (Signed)
Daily Progress Note   Patient Name: Jeremy [name]       Date: 11/11/2019 DOB: [birth year]  Age: 74 y.o. MRN#: [id number] Attending Physician: Jeremy August, MD Primary Care Physician: Jeremy Hilding, MD Admit Date: 11/08/2019  Reason for Consultation/Follow-up: Establishing goals of care  HPI/Patient Profile: 75 y.o. 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 Julien [name]), psoriatic arthritis (methotrexate stopped in 07/2019), COPD, A-fib not on anticoagulation, DM, and chronic anemia requiring transfusions who was admitted on 11/08/2019 with fever, chest pain and cough.  A recent CT scan ordered by Dr. Julien [name] revealed increasing loculated right pleural effusion with possible empyema.  He was evaluated by CT surgery who felt he is a poor surgical candidate but would benefit from IR drain placement to help with the pleural effusion.    He is status post right-sided pigtail chest tube placement by IR on 11/10/19 and continues on broad spectrum antibiotics.   Subjective (Today's discussion 11/11/19): Patient lying in bed, alert, answers all questions appropriately. Wife Jeremy [name] at bedside. He reports right sided chest pain, received 2 mg IV morphine at 11:41, states this helped and that pain is just now starting to return. Denies shortness of breath. Reports chronic difficulty with both falling asleep and staying asleep.  Regarding functional and nutritional status at home, patient and wife reports a slight decline in the last 2-3 weeks since he has been acutely ill. Prior to his he was able to ambulate outside his home (he enjoys walking to his shop to do [employer]). Patient reports good appetite and po intake, however most recent albumin (6/6) is 1.9  Per patient and wife,  they want to resume chemotherapy. Discussed that if/when they decide not to continue chemotherapy, he would be eligible for hospice care. Patient expresses it is very important for him to be at home for as long as possible.  Hospice and palliative care services were explained and offered. Wife states she would like to have him followed by outpatient palliative care.    Length of Stay: 3  Current Medications: Scheduled Meds:   budesonide (PULMICORT) nebulizer solution  0.25 mg Nebulization BID   diltiazem  360 mg Oral Daily   feeding supplement (ENSURE ENLIVE)  237 mL Oral BID BM  folic acid  1 mg Oral Daily   gabapentin  600 mg Oral QHS   insulin aspart  0-9 Units Subcutaneous Q4H   metoprolol tartrate  25 mg Oral BID   pantoprazole  40 mg Oral BID AC   senna-docusate  2 tablet Oral QHS   sodium chloride flush  10-40 mL Intracatheter Q12H   sodium chloride flush  3 mL Intravenous Q12H   tamsulosin  0.4 mg Oral Daily   triamcinolone cream   Topical Daily   umeclidinium-vilanterol  1 puff Inhalation Daily    Continuous Infusions:  ceFEPime (MAXIPIME) IV 2 g (11/11/19 3825)   metronidazole 500 mg (11/11/19 1421)   vancomycin 1,250 mg (11/11/19 1211)    PRN Meds: acetaminophen **OR** acetaminophen, albuterol, mometasone, morphine injection, ondansetron **OR** ondansetron (ZOFRAN) IV, oxyCODONE, polyethylene glycol, traMADol, zolpidem  Physical Exam Constitutional:      General: He is not in acute distress. HENT:     Head: Normocephalic and atraumatic.  Pulmonary:     Effort: Pulmonary effort is normal.     Comments: Right chest tube Neurological:     Mental Status: He is alert and oriented to person, place, and time.             Vital Signs: BP 131/80 (BP Location: Right Arm)    Pulse 100    Temp 98.1 F (36.7 C) (Oral)    Resp 19    Ht 6\' 2"  (1.88 m)    Wt 81.6 kg    SpO2 93%    BMI 23.10 kg/m  SpO2: SpO2: 93 % O2 Device: O2 Device: Room Air O2 Flow  Rate: O2 Flow Rate (L/min): 2 L/min  Intake/output summary:   Intake/Output Summary (Last 24 hours) at 11/11/2019 1453 Last data filed at 11/11/2019 1434 Gross per 24 hour  Intake 1008 ml  Output 2950 ml  Net -1942 ml   LBM: Last BM Date: 11/08/19 Baseline Weight: Weight: 81.6 kg Most recent weight: Weight: 81.6 kg       Palliative Assessment/Data: 40%      Patient Active Problem List   Diagnosis Date Noted   Palliative care encounter    Loculated pleural effusion 11/08/2019   Acquired thrombophilia ([place]) 11/08/2019   Port-A-Cath in place 11/05/2019   Pleural effusion 11/05/2019   Sepsis due to undetermined organism ([place]) 10/19/2019   Thrombocytopenia ([place]) 10/19/2019   Failure to thrive in adult 10/16/2019   Generalized weakness 10/16/2019   Drug-induced skin rash 09/23/2019   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[place]) 08/26/2019   Encounter for antineoplastic chemotherapy 08/26/2019   Encounter for antineoplastic immunotherapy 08/26/2019   Goals of care, counseling/discussion 08/26/2019   AF (paroxysmal atrial fibrillation) ([facility])    Protein-calorie malnutrition, severe 08/07/2019   Hemoptysis 08/06/2019   Mass of right lung 08/06/2019   Former smoker 08/06/2019   Lung mass 08/06/2019   Postobstructive pneumonia 08/06/2019   COPD (chronic obstructive pulmonary disease) ([facility])    HTN (hypertension)    Abnormal weight loss    Leukocytosis    Acute and chronic respiratory failure with hypoxia ([facility])    Anemia in neoplastic disease    Hyperglycemia    Heme + stool 11/06/2018   Type 2 diabetes mellitus ([place]) 2018    Palliative Care Assessment & Plan   Assessment: 74 yo patient with stage 4 lung cancer, status post palliative radiation and currently on chemotherapy admitted with right pleural effusion with possible empyema.   Goals of care (  per patient and wife): return home when medically stable, continue chemotherapy    Recommendations/Plan: - start trazodone 25 mg at bedtime - continue current pain medication regimen - TOC order for referral for outpatient palliative care   Code Status: DNR  Prognosis:   Unable to determine  Discharge Planning:  Home with Palliative Services (and home health)   Thank you for allowing the Palliative Medicine Team to assist in the care of this patient.   Total Time 35 minutes Prolonged Time Billed  no       Greater than 50%  of this time was spent counseling and coordinating care related to the above assessment and plan.  Lavena Bullion, NP  Mariana Kaufman, AGNP-C Palliative Medicine    Please contact Palliative Medicine Team phone at [phone] for questions and concerns.

## 2019-11-11 NOTE — Progress Notes (Addendum)
HEMATOLOGY-ONCOLOGY PROGRESS NOTE  SUBJECTIVE: Status post placement of right-sided pigtail chest tube on 11/10/2019.  No longer having fevers. He is having pain to the right chest tube insertion site. Requiring pain medication. He is not currently having any shortness of breath or cough. Denies abdominal pain, nausea, vomiting. Appetite is fair.  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[place])   09/01/2019 -  Chemotherapy   The patient had palonosetron (ALOXI) injection 0.25 mg, 0.25 mg, Intravenous,  Once, 3 of 4 cycles Administration: 0.25 mg (09/01/2019), 0.25 mg (10/14/2019), 0.25 mg (09/23/2019) pegfilgrastim-jmdb (FULPHILA) injection 6 mg, 6 mg, Subcutaneous,  Once, 2 of 3 cycles Administration: 6 mg (09/03/2019), 6 mg (09/25/2019) CARBOplatin (PARAPLATIN) 460 mg in sodium chloride 0.9 % 250 mL chemo infusion, 460 mg (100 % of original dose 461.5 mg), Intravenous,  Once, 3 of 4 cycles Dose modification: 461.5 mg (original dose 461.5 mg, Cycle 1) Administration: 460 mg (09/01/2019), 460 mg (10/14/2019), 460 mg (09/23/2019) fosaprepitant (EMEND) 150 mg in sodium chloride 0.9 % 145 mL IVPB, 150 mg, Intravenous,  Once, 3 of 4 cycles Administration: 150 mg (09/01/2019), 150 mg (10/14/2019), 150 mg (09/23/2019) PACLitaxel (TAXOL) 348 mg in sodium chloride 0.9 % 500 mL chemo infusion (> 96m/m2), 175 mg/m2 = 348 mg (100 % of original dose 175 mg/m2), Intravenous,  Once, 3 of 4 cycles Dose modification: 175 mg/m2 (original dose 175 mg/m2, Cycle 1, Reason: Provider Judgment) Administration: 348 mg (09/01/2019), 348 mg (10/14/2019), 348 mg (09/23/2019) pembrolizumab (KEYTRUDA) 200 mg in sodium chloride 0.9 % 50 mL chemo infusion, 200 mg, Intravenous, Once, 1 of 3 cycles Administration: 200 mg (09/01/2019)  for chemotherapy treatment.       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  I have reviewed the past medical history, past surgical history, social history and family history with the patient and they are unchanged from previous note.   PHYSICAL EXAMINATION: ECOG PERFORMANCE STATUS: 3 - Symptomatic, >50% confined to bed  Vitals:   11/11/19 0344 11/11/19 0729  BP: 131/80   Pulse: 100   Resp: 19   Temp: 98.1 F (36.7 C)   SpO2: 95% 93%   Filed Weights   11/09/19 0500  Weight: 81.6 kg    Intake/Output from previous day: 06/07 0701 - 06/08 0700 In: -  Out: 2950 [Urine:1650; Chest Tube:1300]  GENERAL:alert, no distress and comfortable SKIN: Widespread rash to his arms and legs secondary to psoriatic arthritis and prior immunotherapy-stable LUNGS: Diminished right mid lung down to base, right chest tube in place HEART: regular rate & rhythm and no murmurs and no lower extremity edema ABDOMEN:abdomen soft, non-tender and normal bowel sounds  NEURO: alert & oriented x 3 with fluent speech, no focal motor/sensory deficits  LABORATORY DATA:  I have reviewed the data as listed CMP Latest Ref Rng & Units 11/11/2019 11/10/2019 11/09/2019  Glucose 70 - 99 mg/dL 80 109(H) -  BUN 8 - 23 mg/dL 11 12 -  Creatinine 0.61 - 1.24 mg/dL 0.54(L) 0.60(L) -  Sodium 135 - 145 mmol/L 136 135 -  Potassium 3.5 - 5.1 mmol/L 3.5 3.1(L) -  Chloride 98 - 111 mmol/L 101 97(L) -  CO2 22 - 32 mmol/L 23 28 -  Calcium 8.9 - 10.3 mg/dL 7.8(L) 7.8(L) -  Total Protein 6.5 - 8.1 g/dL - -  7.5  Total Bilirubin 0.3 - 1.2 mg/dL - - 0.7  Alkaline Phos 38 - 126 U/L - - 59  AST 15 - 41 U/L - - 16  ALT 0 - 44 U/L - - 20    Lab Results  Component Value Date   WBC 7.7 11/11/2019   HGB 8.9 (L) 11/11/2019   HCT 29.6 (L) 11/11/2019   MCV 92.5 11/11/2019   PLT 290 11/11/2019   NEUTROABS 6.8 11/08/2019    DG Chest 2 View  Result Date: 11/08/2019 CLINICAL DATA:  Fever, cough, chills, stage IV lung cancer EXAM: CHEST - 2 VIEW COMPARISON:  10/19/2019, 11/07/2019 FINDINGS: Single frontal view of the chest demonstrates stable right chest wall port. Loculated right pleural effusion unchanged, with compressive atelectasis at the right lung base. No new airspace disease. No pneumothorax. Cardiac silhouette is stable. No acute bony abnormalities. IMPRESSION: 1. Stable loculated right pleural effusion with compressive atelectasis at the right lung base. Electronically Signed   By: Randa Ngo M.D.   On: 11/08/2019 21:16   DG Chest 2 View  Result Date: 10/16/2019 CLINICAL DATA:  Lung cancer and weakness EXAM: CHEST - 2 VIEW COMPARISON:  08/06/2019 CT.  PET-CT 09/08/2019 FINDINGS: Right lower chest opacification with volume loss from necrotic mass by comparison CT. Small to moderate right lateral pleural effusion has increased from prior. Left chest is clear. Stable heart size. Right port with tip at the SVC. IMPRESSION: Necrotic right lung mass as seen on recent staging scan. An overlying right pleural effusion has increased from April. Electronically Signed   By: Monte Fantasia M.D.   On: 10/16/2019 04:43   CT HEAD WO CONTRAST  Result Date: 10/16/2019 CLINICAL DATA:  Mental status change, unknown cause. Additional provided: Weakness, history of lung cancer EXAM: CT HEAD WITHOUT CONTRAST TECHNIQUE: Contiguous axial images were obtained from the base of the skull through the vertex without intravenous contrast. COMPARISON:  Brain MRI 08/10/2019 FINDINGS: Brain: Please note there is limited  assessment for intracranial metastatic disease on this noncontrast CT study. Mild ill-defined hypoattenuation within the cerebral white matter is nonspecific, but consistent with chronic small vessel ischemic disease. Stable, mild generalized parenchymal atrophy. There is no acute intracranial hemorrhage. No demarcated cortical infarct. No extra-axial fluid collection. No evidence of intracranial mass. No midline shift. Vascular: No hyperdense vessel.  Atherosclerotic calcifications Skull: Normal. Negative for fracture or focal lesion. Sinuses/Orbits: Visualized orbits show no acute finding. Paranasal sinus mucosal thickening, most notable within bilateral ethmoid air cells. No significant mastoid effusion. IMPRESSION: 1. Please note there is limited assessment for intracranial metastatic disease on this non-contrast CT study. 2. No evidence of acute intracranial abnormality. 3. Mild generalized parenchymal atrophy and chronic small vessel ischemic disease. 4. Paranasal sinus mucosal thickening, most notably ethmoid. Electronically Signed   By: Kellie Simmering DO   On: 10/16/2019 09:52   CT Chest W Contrast  Result Date: 11/07/2019 CLINICAL DATA:  Non-small cell lung cancer staging, reportedly post chemo and radiotherapy. Currently on immunotherapy as well. EXAM: CT CHEST, ABDOMEN, AND PELVIS WITH CONTRAST TECHNIQUE: Multidetector CT imaging of the chest, abdomen and pelvis  was performed following the standard protocol during bolus administration of intravenous contrast. CONTRAST:  146m OMNIPAQUE IOHEXOL 300 MG/ML  SOLN COMPARISON:  08/11/2019 FINDINGS: CT CHEST FINDINGS Cardiovascular: RIGHT-sided Port-A-Cath terminates at the caval to atrial junction. Heart size is normal. There is mild pericardial nodularity suggested along the RIGHT heart border on image 45 series 2 with small pericardial effusion and pre pericardial lymph nodes. Pericardial effusion is diminished. Central pulmonary vasculature is unremarkable  on venous phase. RIGHT hilum remains encased by soft tissue. Mediastinum/Nodes: No thoracic inlet adenopathy. No axillary lymphadenopathy. Soft tissue in case mint of RIGHT bronchial structures is improved as is soft tissue within the RIGHT mainstem bronchus and RIGHT lower lobe bronchus. Scattered small mediastinal lymph nodes. Diminished subcarinal nodal tissue largest measuring 1.6 cm short axis previously approximately 3.2 cm short axis. No LEFT hilar nodal tissue. Small lymph node adjacent to the distal esophagus and medial to markedly improved masslike area in the RIGHT lower lobe measures 1 cm (image 44, series 2) Lungs/Pleura: Increased size of loculated pleural fluid in the RIGHT chest. Rim of enhancement has developed. Pleural fluid in the RIGHT chest measuring 17 x 9 cm on image 40 of series 2. Diminished size of the dominant masslike area in the RIGHT lower lobe. In an area that may simply represent treated tumor in collapsed lung measuring 6.2 x 4.3 cm corresponding to the necrotic appearing mass in the inferior RIGHT chest on the previous CT from August 06, 2019 that measured 10 x 8.5 cm. Margins are no longer bulging and rounded. Volume loss in the setting of enlarging pleural fluid in the RIGHT lower lobe. Diminished nodularity throughout the RIGHT upper lobe. New area of nodularity in the LEFT lung base (image 90, series 3) 1.8 cm x 0.8 cm. No pleural effusion in the LEFT chest. Motion limited assessment at the LEFT lung base. Musculoskeletal: No sign of chest wall mass. See below for full musculoskeletal details. CT ABDOMEN PELVIS FINDINGS Hepatobiliary: No focal suspicious hepatic lesion. No biliary ductal dilation. No pericholecystic stranding. Portal vein is patent. Pancreas: Pancreas is normal without ductal dilation, inflammation or pancreatic lesion. Spleen: Spleen normal size without focal lesion. Adrenals/Urinary Tract: Adrenal glands are normal. Kidneys enhance symmetrically without sign of  focal lesion or evidence of hydronephrosis. Stomach/Bowel: No acute gastrointestinal process. Colonic diverticulosis without sign of diverticulitis. Vascular/Lymphatic: Calcified atheromatous plaque of the abdominal aorta. No aneurysm. No adenopathy in the retroperitoneum. No retrocrural lymphadenopathy. No pelvic lymphadenopathy. Reproductive: Prostate unremarkable by CT. Other: No abdominal wall hernia or abnormality. No abdominopelvic ascites. Musculoskeletal: Spinal degenerative changes. No acute or destructive bone process. IMPRESSION: 1. Increased size of loculated pleural fluid in the RIGHT chest with new rim of enhancement. Findings may represent enlarging reactive effusion following radiation. Correlate with any clinical signs of infection as empyema could have a similar appearance. Given size of effusion would also correlate with any worsening shortness of breath. No gas present within the fluid. 2. Volume loss in the RIGHT chest increasing as the masslike area in the medial RIGHT chest as diminished in size as described. Also with resolution of upper lobe nodules. 3. Motion limited assessment of lung parenchyma but with new area of nodularity at the LEFT lung base, consider close attention on short interval follow-up. 4. Diminished size of mediastinal lymph nodes within the subcarinal region in particular. 5. No evidence of metastatic disease in the abdomen or pelvis. 6. Aortic atherosclerosis. Aortic Atherosclerosis (ICD10-I70.0). Electronically Signed   By: GZetta Bills  M.D.   On: 11/07/2019 15:55   CT Abdomen Pelvis W Contrast  Result Date: 11/07/2019 CLINICAL DATA:  Non-small cell lung cancer staging, reportedly post chemo and radiotherapy. Currently on immunotherapy as well. EXAM: CT CHEST, ABDOMEN, AND PELVIS WITH CONTRAST TECHNIQUE: Multidetector CT imaging of the chest, abdomen and pelvis was performed following the standard protocol during bolus administration of intravenous contrast.  CONTRAST:  114m OMNIPAQUE IOHEXOL 300 MG/ML  SOLN COMPARISON:  08/11/2019 FINDINGS: CT CHEST FINDINGS Cardiovascular: RIGHT-sided Port-A-Cath terminates at the caval to atrial junction. Heart size is normal. There is mild pericardial nodularity suggested along the RIGHT heart border on image 45 series 2 with small pericardial effusion and pre pericardial lymph nodes. Pericardial effusion is diminished. Central pulmonary vasculature is unremarkable on venous phase. RIGHT hilum remains encased by soft tissue. Mediastinum/Nodes: No thoracic inlet adenopathy. No axillary lymphadenopathy. Soft tissue in case mint of RIGHT bronchial structures is improved as is soft tissue within the RIGHT mainstem bronchus and RIGHT lower lobe bronchus. Scattered small mediastinal lymph nodes. Diminished subcarinal nodal tissue largest measuring 1.6 cm short axis previously approximately 3.2 cm short axis. No LEFT hilar nodal tissue. Small lymph node adjacent to the distal esophagus and medial to markedly improved masslike area in the RIGHT lower lobe measures 1 cm (image 44, series 2) Lungs/Pleura: Increased size of loculated pleural fluid in the RIGHT chest. Rim of enhancement has developed. Pleural fluid in the RIGHT chest measuring 17 x 9 cm on image 40 of series 2. Diminished size of the dominant masslike area in the RIGHT lower lobe. In an area that may simply represent treated tumor in collapsed lung measuring 6.2 x 4.3 cm corresponding to the necrotic appearing mass in the inferior RIGHT chest on the previous CT from August 06, 2019 that measured 10 x 8.5 cm. Margins are no longer bulging and rounded. Volume loss in the setting of enlarging pleural fluid in the RIGHT lower lobe. Diminished nodularity throughout the RIGHT upper lobe. New area of nodularity in the LEFT lung base (image 90, series 3) 1.8 cm x 0.8 cm. No pleural effusion in the LEFT chest. Motion limited assessment at the LEFT lung base. Musculoskeletal: No sign of  chest wall mass. See below for full musculoskeletal details. CT ABDOMEN PELVIS FINDINGS Hepatobiliary: No focal suspicious hepatic lesion. No biliary ductal dilation. No pericholecystic stranding. Portal vein is patent. Pancreas: Pancreas is normal without ductal dilation, inflammation or pancreatic lesion. Spleen: Spleen normal size without focal lesion. Adrenals/Urinary Tract: Adrenal glands are normal. Kidneys enhance symmetrically without sign of focal lesion or evidence of hydronephrosis. Stomach/Bowel: No acute gastrointestinal process. Colonic diverticulosis without sign of diverticulitis. Vascular/Lymphatic: Calcified atheromatous plaque of the abdominal aorta. No aneurysm. No adenopathy in the retroperitoneum. No retrocrural lymphadenopathy. No pelvic lymphadenopathy. Reproductive: Prostate unremarkable by CT. Other: No abdominal wall hernia or abnormality. No abdominopelvic ascites. Musculoskeletal: Spinal degenerative changes. No acute or destructive bone process. IMPRESSION: 1. Increased size of loculated pleural fluid in the RIGHT chest with new rim of enhancement. Findings may represent enlarging reactive effusion following radiation. Correlate with any clinical signs of infection as empyema could have a similar appearance. Given size of effusion would also correlate with any worsening shortness of breath. No gas present within the fluid. 2. Volume loss in the RIGHT chest increasing as the masslike area in the medial RIGHT chest as diminished in size as described. Also with resolution of upper lobe nodules. 3. Motion limited assessment of lung parenchyma but with new  area of nodularity at the LEFT lung base, consider close attention on short interval follow-up. 4. Diminished size of mediastinal lymph nodes within the subcarinal region in particular. 5. No evidence of metastatic disease in the abdomen or pelvis. 6. Aortic atherosclerosis. Aortic Atherosclerosis (ICD10-I70.0). Electronically Signed   By:  Zetta Bills M.D.   On: 11/07/2019 15:55   DG Chest Port 1 View  Result Date: 10/19/2019 CLINICAL DATA:  Fever and urinary complaints EXAM: PORTABLE CHEST 1 VIEW COMPARISON:  Radiograph 10/16/2019 FINDINGS: Likely increasing size of the complex right pleural effusion with associated passive atelectasis though some underlying consolidation is not excluded. A necrotic lung mass is again seen in the right middle lobe, better visualized on cross-sectional imaging. Left lung is predominantly clear aside from basilar atelectasis. Left apical scarring as well. Cardiomediastinal contours are unchanged from prior, partially obscured in the right lung base by overlying opacity. Right IJ Port-A-Cath tip terminates at the superior cavoatrial junction. Telemetry leads overlie the chest. Degenerative changes are present in the imaged spine and shoulders. IMPRESSION: 1. Likely increasing size of the complex right pleural effusion with associated passive atelectasis though some underlying consolidation is not excluded in the setting of fever. 2. Necrotic right lung mass, better visualized on cross-sectional imaging. Electronically Signed   By: Lovena Le M.D.   On: 10/19/2019 17:39   DG HIP UNILAT WITH PELVIS 2-3 VIEWS RIGHT  Result Date: 10/18/2019 CLINICAL DATA:  Right hip pain for 5 days following fall, initial encounter EXAM: DG HIP (WITH OR WITHOUT PELVIS) 2-3V RIGHT COMPARISON:  None. 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 No acute fracture or dislocation is seen. No soft tissue abnormality is noted. IMPRESSION: Degenerative change without acute abnormality. Electronically Signed   By: Inez Catalina M.D.   On: 10/18/2019 12:21   CT IMAGE GUIDED DRAINAGE BY PERCUTANEOUS CATHETER  Result Date: 11/11/2019 INDICATION: 74 year old male with large right-sided pleural effusion with enhancing pleura concerning for empyema. EXAM: CT-guided chest tube placement MEDICATIONS: The patient is  currently admitted to the hospital and receiving intravenous antibiotics. The antibiotics were administered within an appropriate time frame prior to the initiation of the procedure. ANESTHESIA/SEDATION: 1 mg Versed COMPLICATIONS: None immediate. PROCEDURE: Informed written consent was obtained from the patient after a thorough discussion of the procedural risks, benefits and alternatives. All questions were addressed. A timeout was performed prior to the initiation of the procedure. An axial CT scan was performed. The large right-sided pleural effusion was identified. A suitable skin entry site was selected and marked. The overlying skin was sterilely prepped and draped in standard fashion using chlorhexidine skin prep. Local anesthesia was attained by infiltration with 1% lidocaine. A small dermatotomy was made. Using trocar technique, a 12 French drainage catheter was advanced into the pleural space and positioned superiorly within the pleural effusion. The drainage catheter was connected to low wall suction via a [employer] device at -20 cm H2O. The catheter was secured to the skin with 0 Prolene suture. An air type dressing was applied. There was immediate aspiration of copious amounts of turbid pleural fluid. IMPRESSION: Successful placement of 12 French right-sided pigtail thoracostomy tube. Electronically Signed   By: Jacqulynn Cadet M.D.   On: 11/11/2019 09:04    ASSESSMENT AND PLAN: This is a pleasant 74 year old Caucasian male with stage IIIc/IV (T4, N2, M0/M Ia) non-small cell lung cancer, squamous cell carcinoma.  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  PD-L1 expression of 20%.  He completed palliative  radiotherapy to the obstructed right lower lobe lung mass under the care of Dr. Sondra Come on 08/28/2019.  He has been receiving systemic chemotherapy with carboplatin for an AUC of 5, paclitaxel 125 mg meter squared, and Keytruda 200 mg IV every 3 weeks.  He is status post 3 cycles of treatment.  The Keytruda was discontinued after cycle #1 secondary to significant rash.    The patient was seen last week in our office for evaluation prior to cycle #4 of his treatment, but he was too deconditioned and treatment was held.  He was given a blood transfusion due to symptomatic anemia.  He was also scheduled for a restaging CT scan of the chest, abdomen, pelvis.  CT scan results showed increased size of the loculated pleural effusion in the right chest with a new rim of enhancement which could represent enlarging reactive effusion following radiation or possibly empyema, volume loss in the right chest increasing as the masslike area in the medial right chest has diminished in size, resolution of upper lobe nodules, new area of nodularity in the left lung base, diminished size of mediastinal lymph nodes within the subcarinal region in particular, no evidence of metastatic disease in the abdomen or pelvis.  The patient developed fevers and presented to the emergency room.  He has been started on broad-spectrum IV antibiotics and continues to slowly improve.  Fevers have resolved.  Status post right chest tube placement by IR on 11/10/2019. Currently draining serous fluid. He remains on IV antibiotics.  Blood cultures are negative to date.  His hemoglobin is currently 8.9.  Recommend close monitoring and transfuse for hemoglobin less than 8.    LOS: 3 days   Mikey Bussing, DNP, AGPCNP-BC, AOCNP 11/11/19  ADDENDUM: Hematology/Oncology Attending: I had a face-to-face encounter with the patient today.  I recommended his care plan.  I agree with the above note.  This is a very pleasant 74 years old white male with  stage IV non-small cell lung cancer, squamous cell carcinoma.  He started systemic chemotherapy with carboplatin, paclitaxel and Keytruda but [name] was discontinued after cycle #1 secondary to significant skin rash and flare of psoriasis. The patient continues systemic chemotherapy with carboplatin and paclitaxel for 2 more cycles.  He had repeat CT scan of the chest, abdomen pelvis performed few days ago for restaging of his disease and it showed suspicious empyema and involving the right lung.  The patient developed fever and chills 1 day after the scan and he was admitted to the hospital for evaluation and treatment of his condition.  He is currently on IV antibiotics with cefepime and vancomycin.  He is feeling a little bit better and was able to walk in the hallway. His scan also showed improvement of his disease with decrease in the mediastinal lymphadenopathy as well as the right lung mass. I recommended for the patient to continue his current treatment with the antibiotics for now. I will arrange for him a follow-up appointment with me at the cancer center after discharge for evaluation before resuming his systemic therapy. The patient and his wife agreed to the current plan. Thank you so much for taking good care of Mr. Ballon.  I will continue to follow up the patient with  you and assist in his management on as-needed basis.  Disclaimer: This note was dictated with voice recognition software. Similar sounding words can inadvertently be transcribed and may be missed upon review. Eilleen Kempf, MD

## 2019-11-11 NOTE — Progress Notes (Signed)
Patient ID: Jeremy [name], male   DOB: August 30, 1945, 74 y.o.   MRN: [id number]  PROGRESS NOTE    Jeremy [name]  WUJ:[id number] DOB: [birth year] DOA: 11/08/2019 PCP: Jeremy Hilding, MD   Brief Narrative:  74 year old male with history of stage IV lung cancer status post palliative radiotherapy and now on chemotherapy, COPD with chronic hypoxic respiratory failure, paroxysmal A. fib not on anticoagulation, chronic anemia requiring intermittent transfusions, recent admission with sepsis secondary to pneumonia and treated with antibiotics presented on 11/08/2019 with fevers, right-sided chest pain and cough.  In the ED, he was tachycardic and chest x-ray revealed stable loculated right pleural effusion with compressive atelectasis.  Outpatient CT of the chest was concerning for increasing right pleural effusion with question of possible empyema.  Patient was started on broad-spectrum antibiotics and IR was consulted for possible ultrasound-guided thoracentesis.  Assessment & Plan:   Right pleural effusion, suspect parapneumonic with concern for empyema -Presented with fevers and cough with outpatient CT concerning for increasing right pleural effusion with question of possible empyema -Currently on broad-spectrum antibiotics.  Blood cultures negative so far. -Patient was supposed to have ultrasound-guided thoracentesis by IR on 11/09/2019 but he could not sit up for the procedure and requested that the procedure be done today. -Continue broad-spectrum antibiotics in the meantime. -Cardiothoracic surgery evaluation by Dr. Darcey Nora appreciated: Recommended IR guided pigtail placement.  -Status post right-sided pigtail chest tube placement by IR on 11/10/2019.  Follow cultures.  Stage IV lung cancer -Status post palliative radiotherapy and currently on chemotherapy -Follows up with Dr. Julien Nordmann as an outpatient -Oncology follow-up appreciated -Palliative care evaluation appreciated.  Overall prognosis is guarded  to poor -Patient has chronic skin rash which started after he was given Keytruda.  Subsequently [name] has been discontinued.  Chronic hypoxic respiratory failure COPD -Uses supplemental oxygen at home via nasal cannula 2 L/min intermittently. -Currently not wheezing.  Continue supplemental oxygen and nebs.  Anemia of chronic disease -Hemoglobin stable.  Monitor  Paroxysmal A. Fib -Currently intermittently tachycardic.  No anticoagulation because of transfusion dependent anemia - continue diltiazem and metoprolol  Hypokalemia -Improving.  Hypomagnesemia -Replace.  Repeat a.m. labs  Diabetes mellitus type 2 -A1c was 7 in March 2021 - continue CBGs with SSI  Generalized conditioning -PT recommends home health PT.  DVT prophylaxis: SCDs Code Status: DNR Family Communication: Spoke to patient at bedside.  Spoke to wife at bedside on 11/09/2019 Disposition Plan: Status is: Inpatient  Remains inpatient appropriate because:Inpatient level of care appropriate due to severity of illness.  Currently on IV antibiotics and requiring pigtail chest tube.  Follow cultures.  Follow further recommendations from IR/CT surgery.  Dispo: The patient is from: Home              Anticipated d/c is to: Home              Anticipated d/c date is: 2 days              Patient currently is not medically stable to d/c.  Consultants: IR.  Spoke to Dr. Vantrigt/CT surgery on phone on 11/08/2019.  Palliative care  Procedures: Right pigtail chest tube placement on 11/10/2019 by IR Antimicrobials: Cefepime, Flagyl and vancomycin from 11/08/2019 onwards   Subjective: Patient seen and examined at bedside.  Poor historian.  Denies worsening shortness of breath.  Complains of intermittent pain around the chest tube site.  No overnight fever, nausea or vomiting reported.  Still feels very weak and tired.  Objective: Vitals:   11/10/19 1939 11/10/19 2017 11/11/19 0344 11/11/19 0729  BP:  133/88 131/80   Pulse:   (!) 110 100   Resp:  20 19   Temp:  98.6 F (37 C) 98.1 F (36.7 C)   TempSrc:  Oral Oral   SpO2: (!) 87% 97% 95% 93%  Weight:      Height:        Intake/Output Summary (Last 24 hours) at 11/11/2019 0806 Last data filed at 11/11/2019 9563 Gross per 24 hour  Intake --  Output 2950 ml  Net -2950 ml   Filed Weights   11/09/19 0500  Weight: 81.6 kg    Examination:  General exam: No acute distress.  Looks chronically ill. Respiratory system: Bilateral decreased breath sounds at bases with some crackles.  Intermittently tachypneic.  Right-sided chest tube present. cardiovascular system: S1-S2 heard, intermittently tachycardic  gastrointestinal system: Abdomen is nondistended, soft and nontender.  Normal bowel sounds heard  extremities: No cyanosis or clubbing.  Bilateral lower extremity edema present  Central nervous system: Poor historian.  Awake.  Slow to respond.  No focal neurological deficits. Moving extremities Skin: Erythematous plaques involving upper and lower extremities, unchanged; chronic, Keytruda induced as per patient/wife Psychiatry: Anxious looking    Data Reviewed: I have personally reviewed following labs and imaging studies  CBC: Recent Labs  Lab 11/05/19 0942 11/08/19 2023 11/08/19 2107 11/10/19 0224 11/11/19 0453  WBC 8.8 8.7  --  7.9 7.7  NEUTROABS 7.1 6.8  --   --   --   HGB 7.8* 9.7* 10.2* 8.6* 8.9*  HCT 25.9* 32.6* 30.0* 28.4* 29.6*  MCV 94.2 92.1  --  89.9 92.5  PLT 288 346  --  274 875   Basic Metabolic Panel: Recent Labs  Lab 11/05/19 0942 11/08/19 2023 11/08/19 2107 11/10/19 0224 11/11/19 0453  NA 140 139 139 135 136  K 4.3 3.7 3.7 3.1* 3.5  CL 100 97* 98 97* 101  CO2 28 26  --  28 23  GLUCOSE 154* 188* 193* 109* 80  BUN 14 16 14 12 11   CREATININE 0.76 0.90 0.80 0.60* 0.54*  CALCIUM 9.0 8.1*  --  7.8* 7.8*  MG  --   --   --  1.4* 1.5*   GFR: Estimated Creatinine Clearance: 92.1 mL/min (A) (by C-G formula based on SCr of 0.54  mg/dL (L)). Liver Function Tests: Recent Labs  Lab 11/05/19 0942 11/08/19 2023 11/09/19 1559  AST 17 22 16   ALT 20 23 20   ALKPHOS 67 68 59  BILITOT 0.4 0.6 0.7  PROT 8.1 8.4* 7.5  ALBUMIN 1.7* 2.2* 1.9*   No results for input(s): LIPASE, AMYLASE in the last 168 hours. No results for input(s): AMMONIA in the last 168 hours. Coagulation Profile: Recent Labs  Lab 11/08/19 2023  INR 1.1   Cardiac Enzymes: No results for input(s): CKTOTAL, CKMB, CKMBINDEX, TROPONINI in the last 168 hours. BNP (last 3 results) No results for input(s): PROBNP in the last 8760 hours. HbA1C: Recent Labs    11/09/19 1559  HGBA1C 7.0*   CBG: Recent Labs  Lab 11/10/19 1821 11/10/19 2005 11/11/19 0016 11/11/19 0345 11/11/19 0756  GLUCAP 93 177* 122* 76 88   Lipid Profile: No results for input(s): CHOL, HDL, LDLCALC, TRIG, CHOLHDL, LDLDIRECT in the last 72 hours. Thyroid Function Tests: No results for input(s): TSH, T4TOTAL, FREET4, T3FREE, THYROIDAB in the last 72 hours. Anemia Panel: No results for input(s): VITAMINB12, FOLATE, FERRITIN, TIBC,  IRON, RETICCTPCT in the last 72 hours. Sepsis Labs: Recent Labs  Lab 11/08/19 0118 11/08/19 2023  LATICACIDVEN 0.8 2.0*    Recent Results (from the past 240 hour(s))  SARS CORONAVIRUS 2 (TAT 6-24 HRS) Nasopharyngeal Nasopharyngeal Swab     Status: None   Collection Time: 11/08/19  1:32 PM   Specimen: Nasopharyngeal Swab  Result Value Ref Range Status   SARS Coronavirus 2 NEGATIVE NEGATIVE Final    Comment: (NOTE) SARS-CoV-2 target nucleic acids are NOT DETECTED. The SARS-CoV-2 RNA is generally detectable in upper and lower respiratory specimens during the acute phase of infection. Negative results do not preclude SARS-CoV-2 infection, do not rule out co-infections with other pathogens, and should not be used as the sole basis for treatment or other patient management decisions. Negative results must be combined with clinical  observations, patient history, and epidemiological information. The expected result is Negative. Fact Sheet for Patients: SugarRoll.be Fact Sheet for Healthcare Providers: https://www.woods-mathews.com/ This test is not yet approved or cleared by the [place] FDA and  has been authorized for detection and/or diagnosis of SARS-CoV-2 by FDA under an Emergency Use Authorization (EUA). This EUA will remain  in effect (meaning this test can be used) for the duration of the COVID-19 declaration under Section 56 4(b)(1) of the Act, 21 U.S.C. section 360bbb-3(b)(1), unless the authorization is terminated or revoked sooner. Performed at [facility], [place] [address], [place], [place] [address]   Culture, blood (Routine x 2)     Status: None (Preliminary result)   Collection Time: 11/08/19  8:23 PM   Specimen: BLOOD  Result Value Ref Range Status   Specimen Description   Final    BLOOD RIGHT ANTECUBITAL Performed at [facility] [address] [place], [place] [address]    Special Requests   Final    BOTTLES DRAWN AEROBIC AND ANAEROBIC Blood Culture adequate volume Performed at [facility] [address], [place], [place] [address]    Culture   Final    NO GROWTH 2 DAYS Performed at [facility] [address], [place], [place] [address]    Report Status PENDING  Incomplete  Urine Culture     Status: Abnormal   Collection Time: 11/08/19  8:24 PM   Specimen: Urine, Random  Result Value Ref Range Status   Specimen Description   Final    URINE, RANDOM Performed at [facility] [address] [place], [place] [address]    Special Requests   Final    NONE Performed at [facility], [place] [address], [place], [place] [address]    Culture 30,000 COLONIES/mL KLEBSIELLA PNEUMONIAE (A)  Final   Report Status 11/11/2019 FINAL  Final   Organism ID,  Bacteria KLEBSIELLA PNEUMONIAE (A)  Final      Susceptibility   Klebsiella pneumoniae - MIC*    AMPICILLIN >=32 RESISTANT Resistant     CEFAZOLIN <=4 SENSITIVE Sensitive     CEFTRIAXONE <=1 SENSITIVE Sensitive     CIPROFLOXACIN <=0.25 SENSITIVE Sensitive     GENTAMICIN <=1 SENSITIVE Sensitive     IMIPENEM <=0.25 SENSITIVE Sensitive     NITROFURANTOIN 64 INTERMEDIATE Intermediate     TRIMETH/SULFA <=20 SENSITIVE Sensitive     AMPICILLIN/SULBACTAM 8 SENSITIVE Sensitive     PIP/TAZO <=4 SENSITIVE Sensitive     * 30,000 COLONIES/mL KLEBSIELLA PNEUMONIAE  Culture, blood (Routine x 2)     Status: None (Preliminary result)  Collection Time: 11/08/19  8:28 PM   Specimen: BLOOD  Result Value Ref Range Status   Specimen Description   Final    BLOOD LEFT ANTECUBITAL Performed at [facility] [address], [place], [place] [address]    Special Requests   Final    BOTTLES DRAWN AEROBIC AND ANAEROBIC Blood Culture adequate volume Performed at [facility] [address], [place], [place] [address]    Culture   Final    NO GROWTH 2 DAYS Performed at [facility] [address], [place], [place] [address]    Report Status PENDING  Incomplete  MRSA PCR Screening     Status: None   Collection Time: 11/08/19 10:50 PM   Specimen: Nasal Mucosa; Nasopharyngeal  Result Value Ref Range Status   MRSA by PCR NEGATIVE NEGATIVE Final    Comment:        The GeneXpert MRSA Assay (FDA approved for NASAL specimens only), is one component of a comprehensive MRSA colonization surveillance program. It is not intended to diagnose MRSA infection nor to guide or monitor treatment for MRSA infections. Performed at [facility], [place] [address] [place], [place] [address]          Radiology Studies: No results found.      Scheduled Meds:  budesonide (PULMICORT) nebulizer solution  0.25 mg Nebulization BID   diltiazem  360 mg  Oral Daily   feeding supplement (ENSURE ENLIVE)  237 mL Oral BID BM   folic acid  1 mg Oral Daily   gabapentin  600 mg Oral QHS   insulin aspart  0-9 Units Subcutaneous Q4H   metoprolol tartrate  25 mg Oral BID   pantoprazole  40 mg Oral BID AC   senna-docusate  2 tablet Oral QHS   sodium chloride flush  10-40 mL Intracatheter Q12H   sodium chloride flush  3 mL Intravenous Q12H   tamsulosin  0.4 mg Oral Daily   triamcinolone cream   Topical Daily   umeclidinium-vilanterol  1 puff Inhalation Daily   Continuous Infusions:  ceFEPime (MAXIPIME) IV 2 g (11/11/19 3662)   metronidazole 500 mg (11/11/19 9476)   vancomycin 1,250 mg (11/11/19 0104)          Aline August, MD [facility] 11/11/2019, 8:06 AM

## 2019-11-12 LAB — BASIC METABOLIC PANEL
Anion gap: 12 (ref 5–15)
BUN: 12 mg/dL (ref 8–23)
CO2: 25 mmol/L (ref 22–32)
Calcium: 7.6 mg/dL — ABNORMAL LOW (ref 8.9–10.3)
Chloride: 98 mmol/L (ref 98–111)
Creatinine, Ser: 0.61 mg/dL (ref 0.61–1.24)
GFR calc Af Amer: >60 mL/min (ref >60)
GFR calc non Af Amer: >60 mL/min (ref >60)
Glucose, Bld: 99 mg/dL (ref 70–99)
Potassium: 3.6 mmol/L (ref 3.5–5.1)
Sodium: 135 mmol/L (ref 135–145)

## 2019-11-12 LAB — CBC
HCT: 28 % — ABNORMAL LOW (ref 39.0–52.0)
Hemoglobin: 8.4 g/dL — ABNORMAL LOW (ref 13.0–17.0)
MCH: 27.5 pg (ref 26.0–34.0)
MCHC: 30 g/dL (ref 30.0–36.0)
MCV: 91.5 fL (ref 80.0–100.0)
Platelets: 257 10*3/uL (ref 150–400)
RBC: 3.06 MIL/uL — ABNORMAL LOW (ref 4.22–5.81)
RDW: 17.1 % — ABNORMAL HIGH (ref 11.5–15.5)
WBC: 6.7 10*3/uL (ref 4.0–10.5)
nRBC: 0 % (ref 0.0–0.2)

## 2019-11-12 LAB — GLUCOSE, CAPILLARY
Glucose-Capillary: 100 mg/dL — ABNORMAL HIGH (ref 70–99)
Glucose-Capillary: 101 mg/dL — ABNORMAL HIGH (ref 70–99)
Glucose-Capillary: 134 mg/dL — ABNORMAL HIGH (ref 70–99)
Glucose-Capillary: 167 mg/dL — ABNORMAL HIGH (ref 70–99)
Glucose-Capillary: 260 mg/dL — ABNORMAL HIGH (ref 70–99)
Glucose-Capillary: 94 mg/dL (ref 70–99)

## 2019-11-12 LAB — MAGNESIUM: Magnesium: 1.7 mg/dL (ref 1.7–2.4)

## 2019-11-12 MED ORDER — POLYETHYLENE GLYCOL 3350 17 G PO PACK
17.0000 g | PACK | Freq: Every day | ORAL | Status: DC
  Administered 2019-11-13 – 2019-11-18 (×6): 17 g via ORAL
  Filled 2019-11-12 (×6): qty 1

## 2019-11-12 NOTE — Progress Notes (Signed)
Physical Therapy Treatment Patient Details Name: Jeremy [name] MRN: [id number] DOB: 07-Jan-1946 Today's Date: 11/12/2019    History of Present Illness 74 year old male with history of stage IV lung cancer status post palliative radiotherapy and now on chemotherapy, COPD with chronic hypoxic respiratory failure, paroxysmal A. fib not on anticoagulation, chronic anemia requiring intermittent transfusions, recent admission with sepsis secondary to pneumonia and treated with antibiotics presented on 11/08/2019 with fevers, right-sided chest pain and cough.  In the ED, he was tachycardic and chest x-ray revealed stable loculated right pleural effusion with compressive atelectasis.  Outpatient CT of the chest was concerning for increasing right pleural effusion with question of possible empyema.  Patient was started on broad-spectrum antibiotics and chest tube placed 11/10/19.    PT Comments    Pt reports increased fatigue since PT eval and now with chest tube.  Pt unable to ambulate and required min A today due to fatigue and chest tube. He did stand for ~1 min and march in place for 30 sec. His VSS with activity.  Pt had c/o L upper back pain in mid - trap area - performed soft tissue mobilization and educated on posture.     Follow Up Recommendations  Home health PT     Equipment Recommendations  None recommended by PT    Recommendations for Other Services       Precautions / Restrictions Precautions Precautions: Fall Precaution Comments: chest tube    Mobility  Bed Mobility Overal bed mobility: Needs Assistance Bed Mobility: Supine to Sit;Sit to Supine     Supine to sit: Min assist Sit to supine: Min assist   General bed mobility comments: Min A to elevate trunk and get legs back into bed  Transfers Overall transfer level: Needs assistance Equipment used: Rolling walker (2 wheeled) Transfers: Sit to/from Stand Sit to Stand: Min assist;From elevated surface         General  transfer comment: min A and cues for hand placement to stand  Ambulation/Gait Ambulation/Gait assistance: Min guard   Assistive device: Rolling walker (2 wheeled)       General Gait Details: Limited due to chest tube to suction.  Marched in place for 30 sec and took steps toward [facility].  Fatigued easily and requested to return to bed   Stairs             Wheelchair Mobility    Modified Rankin (Stroke Patients Only)       Balance Overall balance assessment: Needs assistance Sitting-balance support: Feet supported;No upper extremity supported Sitting balance-Leahy Scale: Good     Standing balance support: Bilateral upper extremity supported;During functional activity Standing balance-Leahy Scale: Fair Standing balance comment: with RW                            Cognition Arousal/Alertness: Awake/alert Behavior During Therapy: Flat affect Overall Cognitive Status: Within Functional Limits for tasks assessed                                 General Comments: wife present      Exercises      General Comments General comments (skin integrity, edema, etc.):  Pt on RA with sats 93% at lowest; HR 100-117 bpm.    Pt declined further mobility or exercise due to fatigue.    Pt had c/o "tight muscle" in L middle trap area.  Performed 2 min soft tissue mobilization around pain.  No tightness able to be palpated.  Additionally had pt perform shoulder retraction and head up/chin tucks when sitting.  Pt tending to keep neck R lateral flexed and rotated L - wife reports that is near his baseline position.      Pertinent Vitals/Pain Pain Assessment: Faces Faces Pain Scale: Hurts little more Pain Location: L back and neck Pain Descriptors / Indicators: Discomfort;Tightness Pain Intervention(s): Limited activity within patient's tolerance;Monitored during session;Repositioned;Relaxation;Utilized relaxation techniques;Other (comment)(soft tissue  mobilization and posture correction)    Home Living                      Prior Function            PT Goals (current goals can now be found in the care plan section) Acute Rehab PT Goals Patient Stated Goal: increase strength before going home PT Goal Formulation: With patient Time For Goal Achievement: 11/24/19 Potential to Achieve Goals: Good Progress towards PT goals: Not progressing toward goals - comment(pt more fatigued today and now with chest tube)    Frequency    Min 3X/week      PT Plan Current plan remains appropriate    Co-evaluation              AM-PAC PT "6 Clicks" Mobility   Outcome Measure  Help needed turning from your back to your side while in a flat bed without using bedrails?: A Little Help needed moving from lying on your back to sitting on the side of a flat bed without using bedrails?: A Little Help needed moving to and from a bed to a chair (including a wheelchair)?: A Little Help needed standing up from a chair using your arms (e.g., wheelchair or bedside chair)?: A Little Help needed to walk in hospital room?: A Little Help needed climbing 3-5 steps with a railing? : A Little 6 Click Score: 18    End of Session   Activity Tolerance: Patient limited by fatigue Patient left: with call bell/phone within reach;in bed;with bed alarm set;with family/visitor present Nurse Communication: Mobility status PT Visit Diagnosis: Difficulty in walking, not elsewhere classified (R26.2);Muscle weakness (generalized) (M62.81)     Time: 1730-1750 PT Time Calculation (min) (ACUTE ONLY): 20 min  Charges:  $Therapeutic Exercise: 8-22 mins                     Abran Richard, PT Acute Rehab Services Pager [phone] [facility] [phone]     Karlton Lemon 11/12/2019, 6:02 PM

## 2019-11-12 NOTE — Progress Notes (Signed)
Patient and wife agreed to hold on PIV at this time, due to the patient having a port that is accessed.

## 2019-11-12 NOTE — Progress Notes (Signed)
PROGRESS NOTE    Jeremy [name]    Code Status: DNR  WUJ:[id number] DOB: [birth year] DOA: 11/08/2019 LOS: 4 days  PCP: [name], MD CC:  Chief Complaint  Patient presents with  . Fever  . Cough  . [facility] Summary   This is a 74 year old male with history of stage IV lung cancer s/p palliative radiotherapy and now on chemotherapy, COPD with chronic hypoxic respiratory failure, paroxysmal atrial fibrillation not on anticoagulation, chronic anemia requiring intermittent transfusions and recent admission with sepsis secondary to pneumonia and treated with antibiotics who presented on 6/5 with fevers, right-sided chest pain and cough.  In the ED was tachycardic and CXR revealed stable loculated right pleural effusion with compressive atelectasis.  Outpatient CT of the chest was concerning for increased right pleural effusion with question of possible empyema.  He was started on broad-spectrum antibiotics and IR was consulted for possible ultrasound-guided thoracentesis unfortunately could not sit up for the procedure.  Cardiothoracic surgery evaluated the patient and recommended pigtail chest tube placement which was placed by IR on 6/7.    A & P   Principal Problem:   Loculated pleural effusion Active Problems:   COPD (chronic obstructive pulmonary disease) (HCC)   Type 2 diabetes mellitus (HCC)   HTN (hypertension)   Anemia in neoplastic disease   AF (paroxysmal atrial fibrillation) (HCC)   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HCC)   Acquired thrombophilia (HCC)   Recurrent right pleural effusion   Advanced care planning/counseling discussion   Palliative care by specialist   1. Right pleural effusion concerning for parapneumonic versus empyema a. Afebrile and hemodynamically stable on 2 L nasal cannula b. Currently with right-sided pigtail chest tube placement by IR on 6/7 c. Unfortunately, I do not see any cultures obtained from the chest  tube d. Appreciate IR assistance with managing chest tube e. We will discontinue vancomycin with negative MRSA nares and continue on cefepime/metronidazole  2. Stage IV lung cancer s/p palliative radiotherapy a. Follows with [name] as an outpatient b. Palliative and oncology have both been consulted  3. Chronic hypoxic respiratory failure/COPD, stable a. At baseline 2 L/min nasal cannula  4. Anemia of chronic disease, stable  5. Paroxysmal A. fib a. Not on anticoagulation due to transfusion dependent anemia b. Continue diltiazem and metoprolol  6. Hypokalemia resolved  7. Hypomagnesemia a. Follow-up in a.m.  8. Type 2 diabetes with hyperglycemia a. HA1C 7 in March 2021 b. Continue sliding scale  9. Generalized deconditioning a. Health PT at discharge 10.   DVT prophylaxis: SCDs Family Communication: Patient's wife at bedside has been updated  Disposition Plan: Need plan regarding chest tube and antibiotic selection/duration at discharge Status is: Inpatient  Remains inpatient appropriate because:Inpatient level of care appropriate due to severity of illness   Dispo: The patient is from: Home              Anticipated d/c is to: Home              Anticipated d/c date is: 2 days              Patient currently is not medically stable to d/c.          Pressure injury documentation    None  Consultants  IR.   Prior hospitalist spoke to Dr. Ezra Sites surgery on phone on 11/08/2019.   Palliative care Oncology  Procedures  Right pigtail  chest tube placement on 11/10/2019 by IR  Antibiotics   Anti-infectives (From admission, onward)   Start     Dose/Rate Route Frequency Ordered Stop   11/09/19 1200  vancomycin (VANCOREADY) IVPB 1250 mg/250 mL  Status:  Discontinued     1,250 mg 166.7 mL/hr over 90 Minutes Intravenous Every 12 hours 11/09/19 0619 11/12/19 1252   11/09/19 0615  ceFEPIme (MAXIPIME) 2 g in sodium chloride 0.9 % 100 mL IVPB     2 g 200  mL/hr over 30 Minutes Intravenous Every 8 hours 11/09/19 0614     11/08/19 2300  metroNIDAZOLE (FLAGYL) IVPB 500 mg     500 mg 100 mL/hr over 60 Minutes Intravenous Every 8 hours 11/08/19 2255     11/08/19 2300  vancomycin (VANCOREADY) IVPB 1750 mg/350 mL     1,750 mg 175 mL/hr over 120 Minutes Intravenous  Once 11/08/19 2257 11/09/19 0123   11/08/19 2045  vancomycin (VANCOCIN) IVPB 1000 mg/200 mL premix  Status:  Discontinued     1,000 mg 200 mL/hr over 60 Minutes Intravenous  Once 11/08/19 2043 11/08/19 2256   11/08/19 2045  ceFEPIme (MAXIPIME) 2 g in sodium chloride 0.9 % 100 mL IVPB     2 g 200 mL/hr over 30 Minutes Intravenous  Once 11/08/19 2043 11/08/19 2322        Subjective   Patient seen and examined at bedside in no acute distress and resting comfortably. No acute events overnight. Denies any acute complaints at this time. Tolerating diet well.   Objective   Vitals:   11/12/19 0806 11/12/19 1159 11/12/19 1354 11/12/19 1721  BP:  120/68 106/65 120/81  Pulse:  77 87 (!) 104  Resp:  16 18 20   Temp:  98.1 F (36.7 C) (!) 97.5 F (36.4 C) 98.1 F (36.7 C)  TempSrc:   Oral Oral  SpO2: 96% 92% 94% 93%  Weight:      Height:        Intake/Output Summary (Last 24 hours) at 11/12/2019 1756 Last data filed at 11/12/2019 1737 Gross per 24 hour  Intake 1678 ml  Output 850 ml  Net 828 ml   Filed Weights   11/09/19 0500  Weight: 81.6 kg    Examination:  Physical Exam Vitals and nursing note reviewed.  Constitutional:      Appearance: Normal appearance.  HENT:     Head: Normocephalic and atraumatic.  Eyes:     Conjunctiva/sclera: Conjunctivae normal.  Cardiovascular:     Rate and Rhythm: Normal rate and regular rhythm.  Pulmonary:     Effort: Pulmonary effort is normal.     Comments: Chest tube in place Abdominal:     General: Abdomen is flat.     Palpations: Abdomen is soft.  Musculoskeletal:        General: No swelling or tenderness.  Skin:     Coloration: Skin is not jaundiced or pale.  Neurological:     Mental Status: He is alert. Mental status is at baseline.  Psychiatric:        Mood and Affect: Mood normal.        Behavior: Behavior normal.     Data Reviewed: I have personally reviewed following labs and imaging studies  CBC: Recent Labs  Lab 11/08/19 2023 11/08/19 2107 11/10/19 0224 11/11/19 0453 11/12/19 0336  WBC 8.7  --  7.9 7.7 6.7  NEUTROABS 6.8  --   --   --   --   HGB 9.7* 10.2*  8.6* 8.9* 8.4*  HCT 32.6* 30.0* 28.4* 29.6* 28.0*  MCV 92.1  --  89.9 92.5 91.5  PLT 346  --  274 290 267   Basic Metabolic Panel: Recent Labs  Lab 11/08/19 2023 11/08/19 2107 11/10/19 0224 11/11/19 0453 11/12/19 0336  NA 139 139 135 136 135  K 3.7 3.7 3.1* 3.5 3.6  CL 97* 98 97* 101 98  CO2 26  --  28 23 25   GLUCOSE 188* 193* 109* 80 99  BUN 16 14 12 11 12   CREATININE 0.90 0.80 0.60* 0.54* 0.61  CALCIUM 8.1*  --  7.8* 7.8* 7.6*  MG  --   --  1.4* 1.5* 1.7   GFR: Estimated Creatinine Clearance: 92.1 mL/min (by C-G formula based on SCr of 0.61 mg/dL). Liver Function Tests: Recent Labs  Lab 11/08/19 2023 11/09/19 1559  AST 22 16  ALT 23 20  ALKPHOS 68 59  BILITOT 0.6 0.7  PROT 8.4* 7.5  ALBUMIN 2.2* 1.9*   No results for input(s): LIPASE, AMYLASE in the last 168 hours. No results for input(s): AMMONIA in the last 168 hours. Coagulation Profile: Recent Labs  Lab 11/08/19 2023  INR 1.1   Cardiac Enzymes: No results for input(s): CKTOTAL, CKMB, CKMBINDEX, TROPONINI in the last 168 hours. BNP (last 3 results) No results for input(s): PROBNP in the last 8760 hours. HbA1C: No results for input(s): HGBA1C in the last 72 hours. CBG: Recent Labs  Lab 11/12/19 0008 11/12/19 0416 11/12/19 0742 11/12/19 1157 11/12/19 1718  GLUCAP 94 101* 100* 167* 260*   Lipid Profile: No results for input(s): CHOL, HDL, LDLCALC, TRIG, CHOLHDL, LDLDIRECT in the last 72 hours. Thyroid Function Tests: No results for  input(s): TSH, T4TOTAL, FREET4, T3FREE, THYROIDAB in the last 72 hours. Anemia Panel: No results for input(s): VITAMINB12, FOLATE, FERRITIN, TIBC, IRON, RETICCTPCT in the last 72 hours. Sepsis Labs: Recent Labs  Lab 11/08/19 0118 11/08/19 2023  LATICACIDVEN 0.8 2.0*    Recent Results (from the past 240 hour(s))  SARS CORONAVIRUS 2 (TAT 6-24 HRS) Nasopharyngeal Nasopharyngeal Swab     Status: None   Collection Time: 11/08/19  1:32 PM   Specimen: Nasopharyngeal Swab  Result Value Ref Range Status   SARS Coronavirus 2 NEGATIVE NEGATIVE Final    Comment: (NOTE) SARS-CoV-2 target nucleic acids are NOT DETECTED. The SARS-CoV-2 RNA is generally detectable in upper and lower respiratory specimens during the acute phase of infection. Negative results do not preclude SARS-CoV-2 infection, do not rule out co-infections with other pathogens, and should not be used as the sole basis for treatment or other patient management decisions. Negative results must be combined with clinical observations, patient history, and epidemiological information. The expected result is Negative. Fact Sheet for Patients: SugarRoll.be Fact Sheet for Healthcare Providers: https://www.woods-mathews.com/ This test is not yet approved or cleared by the [place] FDA and  has been authorized for detection and/or diagnosis of SARS-CoV-2 by FDA under an Emergency Use Authorization (EUA). This EUA will remain  in effect (meaning this test can be used) for the duration of the COVID-19 declaration under Section 56 4(b)(1) of the Act, 21 U.S.C. section 360bbb-3(b)(1), unless the authorization is terminated or revoked sooner. Performed at [facility], [place] [address], [place], [place] [address]   Culture, blood (Routine x 2)     Status: None (Preliminary result)   Collection Time: 11/08/19  8:23 PM   Specimen: BLOOD  Result Value Ref Range Status   Specimen  Description   Final    BLOOD RIGHT ANTECUBITAL Performed at [facility] [address], [place], [place] [address]    Special Requests   Final    BOTTLES DRAWN AEROBIC AND ANAEROBIC Blood Culture adequate volume Performed at [facility] [address] [place], [place] [address]    Culture   Final    NO GROWTH 3 DAYS Performed at [facility], [place] [address], [place], [place] [address]    Report Status PENDING  Incomplete  Urine Culture     Status: Abnormal   Collection Time: 11/08/19  8:24 PM   Specimen: Urine, Random  Result Value Ref Range Status   Specimen Description   Final    URINE, RANDOM Performed at [facility] [address] [place], [place] [address]    Special Requests   Final    NONE Performed at [facility], [place] [address] [place], [place] [address]    Culture 30,000 COLONIES/mL KLEBSIELLA PNEUMONIAE (A)  Final   Report Status 11/11/2019 FINAL  Final   Organism ID, Bacteria KLEBSIELLA PNEUMONIAE (A)  Final      Susceptibility   Klebsiella pneumoniae - MIC*    AMPICILLIN >=32 RESISTANT Resistant     CEFAZOLIN <=4 SENSITIVE Sensitive     CEFTRIAXONE <=1 SENSITIVE Sensitive     CIPROFLOXACIN <=0.25 SENSITIVE Sensitive     GENTAMICIN <=1 SENSITIVE Sensitive     IMIPENEM <=0.25 SENSITIVE Sensitive     NITROFURANTOIN 64 INTERMEDIATE Intermediate     TRIMETH/SULFA <=20 SENSITIVE Sensitive     AMPICILLIN/SULBACTAM 8 SENSITIVE Sensitive     PIP/TAZO <=4 SENSITIVE Sensitive     * 30,000 COLONIES/mL KLEBSIELLA PNEUMONIAE  Culture, blood (Routine x 2)     Status: None (Preliminary result)   Collection Time: 11/08/19  8:28 PM   Specimen: BLOOD  Result Value Ref Range Status   Specimen Description   Final    BLOOD LEFT ANTECUBITAL Performed at [facility] [address] [place], [place] [address]    Special Requests   Final    BOTTLES DRAWN AEROBIC AND  ANAEROBIC Blood Culture adequate volume Performed at [facility] [address], [place], [place] [address]    Culture   Final    NO GROWTH 3 DAYS Performed at [facility], [place] [address], [place], [place] [address]    Report Status PENDING  Incomplete  MRSA PCR Screening     Status: None   Collection Time: 11/08/19 10:50 PM   Specimen: Nasal Mucosa; Nasopharyngeal  Result Value Ref Range Status   MRSA by PCR NEGATIVE NEGATIVE Final    Comment:        The GeneXpert MRSA Assay (FDA approved for NASAL specimens only), is one component of a comprehensive MRSA colonization surveillance program. It is not intended to diagnose MRSA infection nor to guide or monitor treatment for MRSA infections. Performed at [facility], [place] [address], [place], [place] [address]          Radiology Studies: CT IMAGE GUIDED DRAINAGE BY PERCUTANEOUS CATHETER  Result Date: 11/11/2019 INDICATION: 74 year old male with large right-sided pleural effusion with enhancing pleura concerning for empyema. EXAM: CT-guided chest tube placement MEDICATIONS: The patient is currently admitted to the hospital and receiving intravenous antibiotics. The antibiotics were administered within an appropriate time frame prior to the initiation of the procedure. ANESTHESIA/SEDATION: 1 mg Versed COMPLICATIONS: None immediate.  PROCEDURE: Informed written consent was obtained from the patient after a thorough discussion of the procedural risks, benefits and alternatives. All questions were addressed. A timeout was performed prior to the initiation of the procedure. An axial CT scan was performed. The large right-sided pleural effusion was identified. A suitable skin entry site was selected and marked. The overlying skin was sterilely prepped and draped in standard fashion using chlorhexidine skin prep. Local anesthesia was attained by infiltration with 1% lidocaine. A small dermatotomy  was made. Using trocar technique, a 12 French drainage catheter was advanced into the pleural space and positioned superiorly within the pleural effusion. The drainage catheter was connected to low wall suction via a [facility] device at -20 cm H2O. The catheter was secured to the skin with 0 Prolene suture. An air type dressing was applied. There was immediate aspiration of copious amounts of turbid pleural fluid. IMPRESSION: Successful placement of 12 French right-sided pigtail thoracostomy tube. Electronically Signed   By: Jacqulynn Cadet M.D.   On: 11/11/2019 09:04        Scheduled Meds: . budesonide (PULMICORT) nebulizer solution  0.25 mg Nebulization BID  . diltiazem  360 mg Oral Daily  . feeding supplement (ENSURE ENLIVE)  237 mL Oral BID BM  . folic acid  1 mg Oral Daily  . gabapentin  600 mg Oral QHS  . insulin aspart  0-9 Units Subcutaneous Q4H  . metoprolol tartrate  25 mg Oral BID  . pantoprazole  40 mg Oral BID AC  . [START ON 11/13/2019] polyethylene glycol  17 g Oral Daily  . senna-docusate  2 tablet Oral QHS  . sodium chloride flush  10-40 mL Intracatheter Q12H  . sodium chloride flush  3 mL Intravenous Q12H  . tamsulosin  0.4 mg Oral Daily  . triamcinolone cream   Topical Daily  . umeclidinium-vilanterol  1 puff Inhalation Daily   Continuous Infusions: . ceFEPime (MAXIPIME) IV 2 g (11/12/19 1353)  . metronidazole 500 mg (11/12/19 1702)     Time spent: 25 minutes with over 50% of the time coordinating the patient's care    Harold Hedge, DO [facility] Pager [phone]  Call night coverage person covering after 7pm

## 2019-11-12 NOTE — Progress Notes (Signed)
Pharmacy Antibiotic Note  Jeremy [name] is a 74 y.o. male admitted on 11/08/2019 with pneumonia.  Pharmacy has been consulted for Vancomycin, cefepime dosing.  Day 4 Abxs Afebrile WBC 6.7 SCr 0.61 BCx ngtd UCx 20k kleb pneumo  Plan: Continue current vanc and cefepime dosing for now.  Note, with MRSA PCR negative, labs stable and VSS, can vanc be discontinued at this point?  Height: 6\' 2"  (188 cm) Weight: 81.6 kg (179 lb 14.3 oz) IBW/kg (Calculated) : 82.2  Temp (24hrs), Avg:98 F (36.7 C), Min:97.4 F (36.3 C), Max:98.6 F (37 C)  Recent Labs  Lab 11/08/19 0118 11/08/19 2023 11/08/19 2107 11/10/19 0224 11/11/19 0453 11/12/19 0336  WBC  --  8.7  --  7.9 7.7 6.7  CREATININE  --  0.90 0.80 0.60* 0.54* 0.61  LATICACIDVEN 0.8 2.0*  --   --   --   --     Estimated Creatinine Clearance: 92.1 mL/min (by C-G formula based on SCr of 0.61 mg/dL).    Allergies  Allergen Reactions  . Chlorhexidine   . Aleve [Naproxen Sodium] Hives and Rash    Antimicrobials this admission: Vancomycin 11/08/2019 >> Cefepime 11/08/2019 >>  Flagyl 6/5 >>   Dose adjustments this admission: -  Microbiology results: 6/5 BCx: ngtd 6/5 UCx: 30K Kleb pneumo (R amp, I nitro) 6/5 MRSA PCR: Negative 6/6 Urine strep pneumo: Negative 6/6 Legionella: negative  Thank you for allowing pharmacy to be a part of this patient's care.  Kara Mead 11/12/2019 10:03 AM

## 2019-11-13 ENCOUNTER — Inpatient Hospital Stay (HOSPITAL_COMMUNITY): Payer: Medicare Other

## 2019-11-13 LAB — BASIC METABOLIC PANEL
Anion gap: 10 (ref 5–15)
BUN: 12 mg/dL (ref 8–23)
CO2: 26 mmol/L (ref 22–32)
Calcium: 7.9 mg/dL — ABNORMAL LOW (ref 8.9–10.3)
Chloride: 99 mmol/L (ref 98–111)
Creatinine, Ser: 0.54 mg/dL — ABNORMAL LOW (ref 0.61–1.24)
GFR calc Af Amer: >60 mL/min (ref >60)
GFR calc non Af Amer: >60 mL/min (ref >60)
Glucose, Bld: 114 mg/dL — ABNORMAL HIGH (ref 70–99)
Potassium: 3.2 mmol/L — ABNORMAL LOW (ref 3.5–5.1)
Sodium: 135 mmol/L (ref 135–145)

## 2019-11-13 LAB — CBC
HCT: 28.3 % — ABNORMAL LOW (ref 39.0–52.0)
Hemoglobin: 8.5 g/dL — ABNORMAL LOW (ref 13.0–17.0)
MCH: 27.5 pg (ref 26.0–34.0)
MCHC: 30 g/dL (ref 30.0–36.0)
MCV: 91.6 fL (ref 80.0–100.0)
Platelets: 354 10*3/uL (ref 150–400)
RBC: 3.09 MIL/uL — ABNORMAL LOW (ref 4.22–5.81)
RDW: 17 % — ABNORMAL HIGH (ref 11.5–15.5)
WBC: 6.2 10*3/uL (ref 4.0–10.5)
nRBC: 0 % (ref 0.0–0.2)

## 2019-11-13 LAB — GLUCOSE, CAPILLARY
Glucose-Capillary: 102 mg/dL — ABNORMAL HIGH (ref 70–99)
Glucose-Capillary: 105 mg/dL — ABNORMAL HIGH (ref 70–99)
Glucose-Capillary: 126 mg/dL — ABNORMAL HIGH (ref 70–99)
Glucose-Capillary: 151 mg/dL — ABNORMAL HIGH (ref 70–99)
Glucose-Capillary: 153 mg/dL — ABNORMAL HIGH (ref 70–99)
Glucose-Capillary: 258 mg/dL — ABNORMAL HIGH (ref 70–99)

## 2019-11-13 LAB — MAGNESIUM: Magnesium: 1.5 mg/dL — ABNORMAL LOW (ref 1.7–2.4)

## 2019-11-13 MED ORDER — MAGNESIUM SULFATE 2 GM/50ML IV SOLN
2.0000 g | Freq: Once | INTRAVENOUS | Status: AC
  Administered 2019-11-13: 2 g via INTRAVENOUS
  Filled 2019-11-13: qty 50

## 2019-11-13 MED ORDER — POTASSIUM CHLORIDE CRYS ER 20 MEQ PO TBCR
40.0000 meq | EXTENDED_RELEASE_TABLET | Freq: Once | ORAL | Status: AC
  Administered 2019-11-13: 40 meq via ORAL
  Filled 2019-11-13: qty 2

## 2019-11-13 NOTE — Progress Notes (Signed)
PROGRESS NOTE    Jeremy [name]    Code Status: DNR  SWN:[id number] DOB: 10/02/1945 DOA: 11/08/2019 LOS: 5 days  PCP: [name], MD CC:  Chief Complaint  Patient presents with  . Fever  . Cough  . [name]   This is a 74 year old male with history of stage IV lung cancer s/p palliative radiotherapy and now on chemotherapy, COPD with chronic hypoxic respiratory failure, paroxysmal atrial fibrillation not on anticoagulation, chronic anemia requiring intermittent transfusions and recent admission with sepsis secondary to pneumonia and treated with antibiotics who presented on 6/5 with fevers, right-sided chest pain and cough.  In the ED was tachycardic and CXR revealed stable loculated right pleural effusion with compressive atelectasis.  Outpatient CT of the chest was concerning for increased right pleural effusion with question of possible empyema.  He was started on broad-spectrum antibiotics and IR was consulted for possible ultrasound-guided thoracentesis unfortunately could not sit up for the procedure.  Cardiothoracic surgery evaluated the patient and recommended pigtail chest tube placement which was placed by IR on 6/7.    A & P   Principal Problem:   Loculated pleural effusion Active Problems:   COPD (chronic obstructive pulmonary disease) (HCC)   Type 2 diabetes mellitus (HCC)   HTN (hypertension)   Anemia in neoplastic disease   AF (paroxysmal atrial fibrillation) (HCC)   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HCC)   Acquired thrombophilia (HCC)   Recurrent right pleural effusion   Advanced care planning/counseling discussion   Palliative care by specialist   1. Right pleural effusion concerning for parapneumonic versus empyema a. Afebrile and hemodynamically stable on 2 L nasal cannula b. Currently with right-sided pigtail chest tube placement by IR on 6/7 c. Unfortunately, I do not see any cultures or labs obtained from the  chest tube/pleural fluid to help direct therapy.  Try to obtain cultures from the fluid though I suspect this will come back negative as he has been on antibiotics for several days d. Repeat CXR today with significant decrease in size of loculated right-sided pleural effusion. e. Still with drainage per chest tube, appreciate IR assistance with managing chest tube f. continue on cefepime/metronidazole  2. Stage IV lung cancer s/p palliative radiotherapy a. Follows with [name] as an outpatient b. Palliative and oncology have both been consulted  3. Chronic hypoxic respiratory failure/COPD, stable a. At baseline 2 L/min nasal cannula  4. Anemia of chronic disease, stable  5. Paroxysmal A. fib a. Not on anticoagulation due to transfusion dependent anemia b. Continue diltiazem and metoprolol  6. Hypokalemia replete  7. Hypomagnesemia a. Replete and Follow-up in a.m.  8. Type 2 diabetes with hyperglycemia a. HA1C 7 in March 2021 b. Continue sliding scale  9. Generalized deconditioning a. Health PT at discharge  DVT prophylaxis: SCDs Family Communication: Patient's wife at bedside has been updated  Disposition Plan: Need plan regarding chest tube and antibiotic selection/duration at discharge Status is: Inpatient  Remains inpatient appropriate because:Inpatient level of care appropriate due to severity of illness   Dispo: The patient is from: Home              Anticipated d/c is to: Home              Anticipated d/c date is: 2 days              Patient currently is not medically stable to d/c.  Pressure injury documentation    None  Consultants  IR.   Prior hospitalist spoke to Dr. Ezra Sites surgery on phone on 11/08/2019.   Palliative care Oncology  Procedures  Right pigtail chest tube placement on 11/10/2019 by IR  Antibiotics   Anti-infectives (From admission, onward)   Start     Dose/Rate Route Frequency Ordered Stop   11/09/19 1200   vancomycin (VANCOREADY) IVPB 1250 mg/250 mL  Status:  Discontinued        1,250 mg 166.7 mL/hr over 90 Minutes Intravenous Every 12 hours 11/09/19 0619 11/12/19 1252   11/09/19 0615  ceFEPIme (MAXIPIME) 2 g in sodium chloride 0.9 % 100 mL IVPB     Discontinue     2 g 200 mL/hr over 30 Minutes Intravenous Every 8 hours 11/09/19 0614     11/08/19 2300  metroNIDAZOLE (FLAGYL) IVPB 500 mg     Discontinue     500 mg 100 mL/hr over 60 Minutes Intravenous Every 8 hours 11/08/19 2255     11/08/19 2300  vancomycin (VANCOREADY) IVPB 1750 mg/350 mL        1,750 mg 175 mL/hr over 120 Minutes Intravenous  Once 11/08/19 2257 11/09/19 0123   11/08/19 2045  vancomycin (VANCOCIN) IVPB 1000 mg/200 mL premix  Status:  Discontinued        1,000 mg 200 mL/hr over 60 Minutes Intravenous  Once 11/08/19 2043 11/08/19 2256   11/08/19 2045  ceFEPIme (MAXIPIME) 2 g in sodium chloride 0.9 % 100 mL IVPB        2 g 200 mL/hr over 30 Minutes Intravenous  Once 11/08/19 2043 11/08/19 2322        Subjective   Patient is without any complaints at this time and is wishing to go home sometime soon.  I reached out to interventional radiology regarding the chest tube and IR recommended repeat CXR and would follow-up this afternoon.  No complaints, no overnight events.  Objective   Vitals:   11/12/19 1721 11/12/19 2036 11/12/19 2135 11/13/19 0603  BP: 120/81  123/81 119/78  Pulse: (!) 104  97 85  Resp: 20  18 18   Temp: 98.1 F (36.7 C)  (!) 97.5 F (36.4 C) 97.9 F (36.6 C)  TempSrc: Oral  Oral   SpO2: 93% 97% 94% 92%  Weight:      Height:        Intake/Output Summary (Last 24 hours) at 11/13/2019 1449 Last data filed at 11/13/2019 1300 Gross per 24 hour  Intake 946 ml  Output 950 ml  Net -4 ml   Filed Weights   11/09/19 0500  Weight: 81.6 kg    Examination:  Physical Exam Vitals and nursing note reviewed.  Constitutional:      Appearance: Normal appearance.  HENT:     Head: Normocephalic and  atraumatic.  Eyes:     Conjunctiva/sclera: Conjunctivae normal.  Cardiovascular:     Rate and Rhythm: Normal rate and regular rhythm.  Pulmonary:     Effort: Pulmonary effort is normal.     Breath sounds: Normal breath sounds.     Comments: Chest tube draining with cloudy fluid Abdominal:     General: Abdomen is flat.     Palpations: Abdomen is soft.  Musculoskeletal:        General: No swelling or tenderness.  Skin:    Coloration: Skin is not jaundiced or pale.     Comments: Chronic diffuse scaly rash  Neurological:     Mental Status:  He is alert. Mental status is at baseline.  Psychiatric:        Mood and Affect: Mood normal.        Behavior: Behavior normal.     Data Reviewed: I have personally reviewed following labs and imaging studies  CBC: Recent Labs  Lab 11/08/19 2023 11/08/19 2023 11/08/19 2107 11/10/19 0224 11/11/19 0453 11/12/19 0336 11/13/19 0450  WBC 8.7  --   --  7.9 7.7 6.7 6.2  NEUTROABS 6.8  --   --   --   --   --   --   HGB 9.7*   < > 10.2* 8.6* 8.9* 8.4* 8.5*  HCT 32.6*   < > 30.0* 28.4* 29.6* 28.0* 28.3*  MCV 92.1  --   --  89.9 92.5 91.5 91.6  PLT 346  --   --  274 290 257 354   < > = values in this interval not displayed.   Basic Metabolic Panel: Recent Labs  Lab 11/08/19 2023 11/08/19 2023 11/08/19 2107 11/10/19 0224 11/11/19 0453 11/12/19 0336 11/13/19 0450  NA 139   < > 139 135 136 135 135  K 3.7   < > 3.7 3.1* 3.5 3.6 3.2*  CL 97*   < > 98 97* 101 98 99  CO2 26  --   --  28 23 25 26   GLUCOSE 188*   < > 193* 109* 80 99 114*  BUN 16   < > 14 12 11 12 12   CREATININE 0.90   < > 0.80 0.60* 0.54* 0.61 0.54*  CALCIUM 8.1*  --   --  7.8* 7.8* 7.6* 7.9*  MG  --   --   --  1.4* 1.5* 1.7 1.5*   < > = values in this interval not displayed.   GFR: Estimated Creatinine Clearance: 92.1 mL/min (A) (by C-G formula based on SCr of 0.54 mg/dL (L)). Liver Function Tests: Recent Labs  Lab 11/08/19 2023 11/09/19 1559  AST 22 16  ALT 23 20   ALKPHOS 68 59  BILITOT 0.6 0.7  PROT 8.4* 7.5  ALBUMIN 2.2* 1.9*   No results for input(s): LIPASE, AMYLASE in the last 168 hours. No results for input(s): AMMONIA in the last 168 hours. Coagulation Profile: Recent Labs  Lab 11/08/19 2023  INR 1.1   Cardiac Enzymes: No results for input(s): CKTOTAL, CKMB, CKMBINDEX, TROPONINI in the last 168 hours. BNP (last 3 results) No results for input(s): PROBNP in the last 8760 hours. HbA1C: No results for input(s): HGBA1C in the last 72 hours. CBG: Recent Labs  Lab 11/12/19 2136 11/13/19 0007 11/13/19 0605 11/13/19 0916 11/13/19 1234  GLUCAP 134* 105* 102* 126* 258*   Lipid Profile: No results for input(s): CHOL, HDL, LDLCALC, TRIG, CHOLHDL, LDLDIRECT in the last 72 hours. Thyroid Function Tests: No results for input(s): TSH, T4TOTAL, FREET4, T3FREE, THYROIDAB in the last 72 hours. Anemia Panel: No results for input(s): VITAMINB12, FOLATE, FERRITIN, TIBC, IRON, RETICCTPCT in the last 72 hours. Sepsis Labs: Recent Labs  Lab 11/08/19 0118 11/08/19 2023  LATICACIDVEN 0.8 2.0*    Recent Results (from the past 240 hour(s))  SARS CORONAVIRUS 2 (TAT 6-24 HRS) Nasopharyngeal Nasopharyngeal Swab     Status: None   Collection Time: 11/08/19  1:32 PM   Specimen: Nasopharyngeal Swab  Result Value Ref Range Status   SARS Coronavirus 2 NEGATIVE NEGATIVE Final    Comment: (NOTE) SARS-CoV-2 target nucleic acids are NOT DETECTED. The SARS-CoV-2 RNA is generally detectable  in upper and lower respiratory specimens during the acute phase of infection. Negative results do not preclude SARS-CoV-2 infection, do not rule out co-infections with other pathogens, and should not be used as the sole basis for treatment or other patient management decisions. Negative results must be combined with clinical observations, patient history, and epidemiological information. The expected result is Negative. Fact Sheet for  Patients: SugarRoll.be Fact Sheet for Healthcare Providers: https://www.woods-mathews.com/ This test is not yet approved or cleared by the [place] FDA and  has been authorized for detection and/or diagnosis of SARS-CoV-2 by FDA under an Emergency Use Authorization (EUA). This EUA will remain  in effect (meaning this test can be used) for the duration of the COVID-19 declaration under Section 56 4(b)(1) of the Act, 21 U.S.C. section 360bbb-3(b)(1), unless the authorization is terminated or revoked sooner. Performed at [facility], [place] [address], [place], [place] [address]   Culture, blood (Routine x 2)     Status: None (Preliminary result)   Collection Time: 11/08/19  8:23 PM   Specimen: BLOOD  Result Value Ref Range Status   Specimen Description   Final    BLOOD RIGHT ANTECUBITAL Performed at [facility] [address] [place], [place] [address]    Special Requests   Final    BOTTLES DRAWN AEROBIC AND ANAEROBIC Blood Culture adequate volume Performed at [facility] [address], [place], [place] [address]    Culture   Final    NO GROWTH 4 DAYS Performed at [facility], [place] [address] [place], [place] [address]    Report Status PENDING  Incomplete  Urine Culture     Status: Abnormal   Collection Time: 11/08/19  8:24 PM   Specimen: Urine, Random  Result Value Ref Range Status   Specimen Description   Final    URINE, RANDOM Performed at [facility] [address], [place], [place] [address]    Special Requests   Final    NONE Performed at [facility], [place] [address], [place], [place] [address]    Culture 30,000 COLONIES/mL KLEBSIELLA PNEUMONIAE (A)  Final   Report Status 11/11/2019 FINAL  Final   Organism ID, Bacteria KLEBSIELLA PNEUMONIAE (A)  Final      Susceptibility   Klebsiella pneumoniae - MIC*    AMPICILLIN >=32  RESISTANT Resistant     CEFAZOLIN <=4 SENSITIVE Sensitive     CEFTRIAXONE <=1 SENSITIVE Sensitive     CIPROFLOXACIN <=0.25 SENSITIVE Sensitive     GENTAMICIN <=1 SENSITIVE Sensitive     IMIPENEM <=0.25 SENSITIVE Sensitive     NITROFURANTOIN 64 INTERMEDIATE Intermediate     TRIMETH/SULFA <=20 SENSITIVE Sensitive     AMPICILLIN/SULBACTAM 8 SENSITIVE Sensitive     PIP/TAZO <=4 SENSITIVE Sensitive     * 30,000 COLONIES/mL KLEBSIELLA PNEUMONIAE  Culture, blood (Routine x 2)     Status: None (Preliminary result)   Collection Time: 11/08/19  8:28 PM   Specimen: BLOOD  Result Value Ref Range Status   Specimen Description   Final    BLOOD LEFT ANTECUBITAL Performed at [facility] [address] [place], [place] [address]    Special Requests   Final    BOTTLES DRAWN AEROBIC AND ANAEROBIC Blood Culture adequate volume Performed at [facility] [address] [place],  [address]    Culture   Final    NO GROWTH 4 DAYS Performed at [facility]  [facility], [place] [address], [place], [place] [address]    Report Status PENDING  Incomplete  MRSA PCR Screening     Status: None   Collection Time: 11/08/19 10:50 PM   Specimen: Nasal Mucosa; Nasopharyngeal  Result Value Ref Range Status   MRSA by PCR NEGATIVE NEGATIVE Final    Comment:        The GeneXpert MRSA Assay (FDA approved for NASAL specimens only), is one component of a comprehensive MRSA colonization surveillance program. It is not intended to diagnose MRSA infection nor to guide or monitor treatment for MRSA infections. Performed at [facility], [place] [address], [place], [place] [address]          Radiology Studies: No results found.      Scheduled Meds: . budesonide (PULMICORT) nebulizer solution  0.25 mg Nebulization BID  . diltiazem  360 mg Oral Daily  . feeding supplement (ENSURE ENLIVE)  237 mL Oral BID BM  . folic acid  1 mg Oral Daily  .  gabapentin  600 mg Oral QHS  . insulin aspart  0-9 Units Subcutaneous Q4H  . metoprolol tartrate  25 mg Oral BID  . pantoprazole  40 mg Oral BID AC  . polyethylene glycol  17 g Oral Daily  . senna-docusate  2 tablet Oral QHS  . sodium chloride flush  10-40 mL Intracatheter Q12H  . sodium chloride flush  3 mL Intravenous Q12H  . tamsulosin  0.4 mg Oral Daily  . triamcinolone cream   Topical Daily  . umeclidinium-vilanterol  1 puff Inhalation Daily   Continuous Infusions: . ceFEPime (MAXIPIME) IV 2 g (11/13/19 1317)  . metronidazole 500 mg (11/13/19 1405)     Time spent: 30 minutes with over 50% of the time coordinating the patient's care    Harold Hedge, DO [facility] Pager [phone]  Call night coverage person covering after 7pm

## 2019-11-13 NOTE — Progress Notes (Signed)
Referring Physician(s): Starla Link, Kshitiz  Supervising Physician: Aletta Edouard  Patient Status:  [facility] - In-pt  Chief Complaint: None  Subjective:  Loculated right pleural effusion, concerning for parapneumonic versus empyema, s/p right chest tube placement in IR 11/10/2019 by Dr. Laurence Ferrari. Patient awake and alert laying in bed eating an orange with no complaints at this time. Accompanied by wife at bedside. Right chest tube site c/d/i. Patient on room air.   Allergies: Chlorhexidine and Aleve [naproxen sodium]  Medications: Prior to Admission medications   Medication Sig Start Date End Date Taking? Authorizing Provider  acetaminophen (TYLENOL) 325 MG tablet Take 2 tablets (650 mg total) by mouth every 6 (six) hours as needed for mild pain, fever or headache. 10/28/19  Yes Emokpae, Courage, MD  albuterol (VENTOLIN HFA) 108 (90 Base) MCG/ACT inhaler Inhale 2 puffs into the lungs every 6 (six) hours as needed for wheezing or shortness of breath.   Yes [provider]  diltiazem (CARDIZEM CD) 360 MG 24 hr capsule Take 1 capsule (360 mg total) by mouth daily. 10/29/19  Yes Emokpae, Courage, MD  doxycycline (MONODOX) 50 MG capsule Take 50 mg by mouth 2 (two) times daily as needed (rosacea flare ups.).   Yes [provider]  ferrous sulfate 325 (65 FE) MG tablet Take 325 mg by mouth daily with supper.   Yes [provider]  FLOVENT HFA 110 MCG/ACT inhaler Inhale 1 puff into the lungs 2 (two) times daily.  06/20/19  Yes [provider]  folic acid (FOLVITE) 1 MG tablet Take 1 mg by mouth daily.   Yes [provider]  gabapentin (NEURONTIN) 300 MG capsule Take 600 mg by mouth at bedtime.   Yes [provider]  lidocaine-prilocaine (EMLA) cream Apply to the Port-A-Cath site 30 minutes before treatment 08/26/19  Yes Curt Bears, MD  loratadine (CLARITIN) 10 MG tablet Take 10 mg by mouth daily.   Yes [provider]  metFORMIN  (GLUCOPHAGE-XR) 500 MG 24 hr tablet Take 500-1,000 mg by mouth See admin instructions. Take 2 tablets (1000 mg) by mouth in the morning & 1 tablet (500 mg) by mouth at night. 10/15/18  Yes [provider]  metoprolol tartrate (LOPRESSOR) 25 MG tablet Take 1 tablet (25 mg total) by mouth 2 (two) times daily. 10/28/19  Yes Emokpae, Courage, MD  metroNIDAZOLE (METROCREAM) 0.75 % cream Apply 1 application topically 2 (two) times daily as needed (facial rosacea).    Yes [provider]  mometasone (ELOCON) 0.1 % cream Apply 1 application topically 2 (two) times daily as needed (psoriasis).    Yes [provider]  Omega-3 Fatty Acids (FISH OIL) 1000 MG CAPS Take 1,000 mg by mouth daily.    Yes [provider]  pantoprazole (PROTONIX) 40 MG tablet 1 po 30 mins prior to first meal 12/30/18  Yes Fields, Marga Melnick, MD  prochlorperazine (COMPAZINE) 10 MG tablet Take 1 tablet (10 mg total) by mouth every 6 (six) hours as needed for nausea or vomiting. 08/26/19  Yes Curt Bears, MD  senna-docusate (SENOKOT-S) 8.6-50 MG tablet Take 2 tablets by mouth at bedtime. 10/28/19 10/27/20 Yes Emokpae, Courage, MD  traMADol (ULTRAM) 50 MG tablet Take 50 mg by mouth every 4 (four) hours as needed for moderate pain. for pain 08/19/18  Yes [provider]  triamcinolone lotion (KENALOG) 0.1 % Apply 1 application topically 4 (four) times daily. 09/15/19  Yes Tanner, Lyndon Code., PA-C  umeclidinium-vilanterol (ANORO ELLIPTA) 62.5-25 MCG/INH AEPB Inhale  1 puff into the lungs daily. 08/13/19  Yes Mariel Aloe, MD     Vital Signs: BP 119/78 (BP Location: Right Arm)   Pulse 85   Temp 97.9 F (36.6 C)   Resp 18   Ht 6\' 2"  (1.88 m)   Wt 179 lb 14.3 oz (81.6 kg)   SpO2 92%   BMI 23.10 kg/m   Physical Exam Vitals and nursing note reviewed.  Constitutional:      General: He is not in acute distress.    Appearance: Normal appearance.  Pulmonary:     Effort: Pulmonary effort is normal.  No respiratory distress.     Comments: Right chest tube site without tenderness, erythema, drainage, or active bleeding; approximately 1830 cc purulent fluid in pleure-vac (approximately 200 cc increase in output in past 24 hours per chart); tube to suction with (-) air leak.  Skin:    General: Skin is warm and dry.  Neurological:     Mental Status: He is alert and oriented to person, place, and time.     Imaging: CT IMAGE GUIDED DRAINAGE BY PERCUTANEOUS CATHETER  Result Date: 11/11/2019 INDICATION: 74 year old male with large right-sided pleural effusion with enhancing pleura concerning for empyema. EXAM: CT-guided chest tube placement MEDICATIONS: The patient is currently admitted to the hospital and receiving intravenous antibiotics. The antibiotics were administered within an appropriate time frame prior to the initiation of the procedure. ANESTHESIA/SEDATION: 1 mg Versed COMPLICATIONS: None immediate. PROCEDURE: Informed written consent was obtained from the patient after a thorough discussion of the procedural risks, benefits and alternatives. All questions were addressed. A timeout was performed prior to the initiation of the procedure. An axial CT scan was performed. The large right-sided pleural effusion was identified. A suitable skin entry site was selected and marked. The overlying skin was sterilely prepped and draped in standard fashion using chlorhexidine skin prep. Local anesthesia was attained by infiltration with 1% lidocaine. A small dermatotomy was made. Using trocar technique, a 12 French drainage catheter was advanced into the pleural space and positioned superiorly within the pleural effusion. The drainage catheter was connected to low wall suction via a [facility] device at -20 cm H2O. The catheter was secured to the skin with 0 Prolene suture. An air type dressing was applied. There was immediate aspiration of copious amounts of turbid pleural fluid. IMPRESSION: Successful  placement of 12 French right-sided pigtail thoracostomy tube. Electronically Signed   By: Jacqulynn Cadet M.D.   On: 11/11/2019 09:04    Labs:  CBC: Recent Labs    11/10/19 0224 11/11/19 0453 11/12/19 0336 11/13/19 0450  WBC 7.9 7.7 6.7 6.2  HGB 8.6* 8.9* 8.4* 8.5*  HCT 28.4* 29.6* 28.0* 28.3*  PLT 274 290 257 354    COAGS: Recent Labs    08/07/19 1927 10/19/19 1720 10/19/19 1750 11/08/19 2023  INR 1.1 1.0  --  1.1  APTT 33  --  34  --     BMP: Recent Labs    11/10/19 0224 11/11/19 0453 11/12/19 0336 11/13/19 0450  NA 135 136 135 135  K 3.1* 3.5 3.6 3.2*  CL 97* 101 98 99  CO2 28 23 25 26   GLUCOSE 109* 80 99 114*  BUN 12 11 12 12   CALCIUM 7.8* 7.8* 7.6* 7.9*  CREATININE 0.60* 0.54* 0.61 0.54*  GFRNONAA >60 >60 >60 >60  GFRAA >60 >60 >60 >60    LIVER FUNCTION TESTS: Recent Labs    10/21/19 0541 10/22/19 0434 10/26/19  1655 11/05/19 0942 11/08/19 2023 11/09/19 1559  BILITOT 0.8  --   --  0.4 0.6 0.7  AST 19  --   --  17 22 16   ALT 21  --   --  20 23 20   ALKPHOS 51  --   --  67 68 59  PROT 6.3*  --   --  8.1 8.4* 7.5  ALBUMIN 2.0*   < > 2.3* 1.7* 2.2* 1.9*   < > = values in this interval not displayed.    Assessment and Plan:  Loculated right pleural effusion, concerning for parapneumonic versus empyema, s/p right chest tube placement in IR 11/10/2019 by Dr. Laurence Ferrari. Right chest tube stable with approximately 1830 cc purulent fluid in pleure-vac (approximately 200 cc increase in output in past 24 hours per chart), tube to suction with (-) air leak. For repeat CXR today per TRH. Continue current tube maintenance, tube to remain to suction. Further plans per TRH/TCTS/oncology/palliative- appreciate and agree with management. IR to follow.   Electronically Signed: Earley Abide, PA-C 11/13/2019, 9:34 AM   I spent a total of 25 Minutes at the the patient's bedside AND on the patient's hospital floor or unit, greater than 50% of which was  counseling/coordinating care for loculated right pleural effusion s/p right chest tube placement.

## 2019-11-14 ENCOUNTER — Telehealth: Payer: Self-pay

## 2019-11-14 ENCOUNTER — Ambulatory Visit: Payer: Medicare Other | Admitting: Student

## 2019-11-14 ENCOUNTER — Ambulatory Visit: Payer: Medicare Other | Admitting: Family Medicine

## 2019-11-14 ENCOUNTER — Inpatient Hospital Stay (HOSPITAL_COMMUNITY): Payer: Medicare Other

## 2019-11-14 LAB — BASIC METABOLIC PANEL
Anion gap: 9 (ref 5–15)
BUN: 9 mg/dL (ref 8–23)
CO2: 29 mmol/L (ref 22–32)
Calcium: 7.9 mg/dL — ABNORMAL LOW (ref 8.9–10.3)
Chloride: 97 mmol/L — ABNORMAL LOW (ref 98–111)
Creatinine, Ser: 0.58 mg/dL — ABNORMAL LOW (ref 0.61–1.24)
GFR calc Af Amer: >60 mL/min (ref >60)
GFR calc non Af Amer: >60 mL/min (ref >60)
Glucose, Bld: 123 mg/dL — ABNORMAL HIGH (ref 70–99)
Potassium: 3.5 mmol/L (ref 3.5–5.1)
Sodium: 135 mmol/L (ref 135–145)

## 2019-11-14 LAB — CBC
HCT: 28 % — ABNORMAL LOW (ref 39.0–52.0)
Hemoglobin: 8.6 g/dL — ABNORMAL LOW (ref 13.0–17.0)
MCH: 28.1 pg (ref 26.0–34.0)
MCHC: 30.7 g/dL (ref 30.0–36.0)
MCV: 91.5 fL (ref 80.0–100.0)
Platelets: 239 10*3/uL (ref 150–400)
RBC: 3.06 MIL/uL — ABNORMAL LOW (ref 4.22–5.81)
RDW: 17.1 % — ABNORMAL HIGH (ref 11.5–15.5)
WBC: 6.4 10*3/uL (ref 4.0–10.5)
nRBC: 0 % (ref 0.0–0.2)

## 2019-11-14 LAB — GLUCOSE, CAPILLARY
Glucose-Capillary: 104 mg/dL — ABNORMAL HIGH (ref 70–99)
Glucose-Capillary: 110 mg/dL — ABNORMAL HIGH (ref 70–99)
Glucose-Capillary: 116 mg/dL — ABNORMAL HIGH (ref 70–99)
Glucose-Capillary: 233 mg/dL — ABNORMAL HIGH (ref 70–99)
Glucose-Capillary: 129 mg/dL — ABNORMAL HIGH (ref 70–99)
Glucose-Capillary: 134 mg/dL — ABNORMAL HIGH (ref 70–99)

## 2019-11-14 LAB — CULTURE, BLOOD (ROUTINE X 2)
Culture: NO GROWTH
Culture: NO GROWTH
Special Requests: ADEQUATE
Special Requests: ADEQUATE

## 2019-11-14 LAB — MAGNESIUM: Magnesium: 1.9 mg/dL (ref 1.7–2.4)

## 2019-11-14 NOTE — Progress Notes (Signed)
Physical Therapy Treatment Patient Details Name: Jeremy [name] MRN: [id number] DOB: 1945-11-06 Today's Date: 11/14/2019    History of Present Illness 74 year old male with history of stage IV lung cancer status post palliative radiotherapy and now on chemotherapy, COPD with chronic hypoxic respiratory failure, paroxysmal A. fib not on anticoagulation, chronic anemia requiring intermittent transfusions, recent admission with sepsis secondary to pneumonia and treated with antibiotics presented on 11/08/2019 with fevers, right-sided chest pain and cough.  In the ED, he was tachycardic and chest x-ray revealed stable loculated right pleural effusion with compressive atelectasis.  Outpatient CT of the chest was concerning for increasing right pleural effusion with question of possible empyema.  Patient was started on broad-spectrum antibiotics and chest tube placed 11/10/19.    PT Comments    Patient progressing with therapy this date and gait training continued with RW. Pt required min assist to steady for sit<>stand transfer and during gait to prevent LOB no 2 occasions. Pt step pattern improved with practice and with cues to maintain close proximity to RW. He was educated on pursed lip breathing during gait when he became SOB and educated on ankle pumps to perform while resting. Patient will continue to benefit from skilled PT interventions to address impairments and progress mobility as able.   Follow Up Recommendations  Home health PT     Equipment Recommendations  None recommended by PT    Recommendations for Other Services       Precautions / Restrictions Precautions Precautions: Fall Precaution Comments: chest tube Restrictions Weight Bearing Restrictions: No    Mobility  Bed Mobility Overal bed mobility: Needs Assistance Bed Mobility: Supine to Sit     Supine to sit: Min guard;HOB elevated     General bed mobility comments: HOB elevated and pt able to raise trunk upright and  pivot to EOB. Assist for line management.  Transfers Overall transfer level: Needs assistance Equipment used: Rolling walker (2 wheeled) Transfers: Sit to/from Stand Sit to Stand: Min assist;From elevated surface         General transfer comment: cues for safe hand placement/tecnique. Pt preferring to use bil UE's on RW for power up. assist to steady with rising.   Ambulation/Gait Ambulation/Gait assistance: Min assist Gait Distance (Feet): 170 Feet Assistive device: Rolling walker (2 wheeled) Gait Pattern/deviations: Step-through pattern;Decreased stride length;Shuffle;Scissoring;Narrow base of support Gait velocity: decreased   General Gait Details: pt required cues for safe proximity to RW and assist for walker management throughout. pt unsteady with occasional scirssoring step and LOB x2 requiring min assist. pt required 4 standing rest breaks during gait and was educated on pursed lip breathing during breaks. Pt on 2L/min and SpO2 remained 94-96% with HR max of 114 bpm.    Stairs             Wheelchair Mobility    Modified Rankin (Stroke Patients Only)       Balance Overall balance assessment: Needs assistance Sitting-balance support: Feet supported;No upper extremity supported Sitting balance-Leahy Scale: Good     Standing balance support: Bilateral upper extremity supported;During functional activity Standing balance-Leahy Scale: Poor             Cognition Arousal/Alertness: Awake/alert Behavior During Therapy: WFL for tasks assessed/performed Overall Cognitive Status: Within Functional Limits for tasks assessed          General Comments: wife present      Exercises General Exercises - Lower Extremity Ankle Circles/Pumps: Seated;AROM;Strengthening;Both;10 reps    General Comments  Pertinent Vitals/Pain Pain Assessment: Faces Faces Pain Scale: Hurts a little bit Pain Location: low back Pain Descriptors / Indicators:  Discomfort;Tightness Pain Intervention(s): Limited activity within patient's tolerance;Monitored during session;Repositioned           PT Goals (current goals can now be found in the care plan section) Acute Rehab PT Goals Patient Stated Goal: increase strength before going home PT Goal Formulation: With patient Time For Goal Achievement: 11/24/19 Potential to Achieve Goals: Good Progress towards PT goals: Progressing toward goals    Frequency    Min 3X/week      PT Plan Current plan remains appropriate       AM-PAC PT "6 Clicks" Mobility   Outcome Measure  Help needed turning from your back to your side while in a flat bed without using bedrails?: A Little Help needed moving from lying on your back to sitting on the side of a flat bed without using bedrails?: A Little Help needed moving to and from a bed to a chair (including a wheelchair)?: A Little Help needed standing up from a chair using your arms (e.g., wheelchair or bedside chair)?: A Little Help needed to walk in hospital room?: A Little Help needed climbing 3-5 steps with a railing? : A Little 6 Click Score: 18    End of Session Equipment Utilized During Treatment: Gait belt Activity Tolerance: Patient limited by fatigue Patient left: with call bell/phone within reach;with family/visitor present;in chair Nurse Communication: Mobility status PT Visit Diagnosis: Difficulty in walking, not elsewhere classified (R26.2);Muscle weakness (generalized) (M62.81)     Time: 4166-0630 PT Time Calculation (min) (ACUTE ONLY): 36 min  Charges:  $Gait Training: 8-22 mins $Therapeutic Activity: 8-22 mins                     Verner Mould, DPT Acute Rehabilitation Services  Office [phone] Pager [phone]  11/14/2019 5:24 PM

## 2019-11-14 NOTE — Telephone Encounter (Signed)
Received voicemail from patient wife stating that patient is currently admitted and will need to cancel appointments on Monday. Appointments cancel. Dr Julien Nordmann and Rubin Payor PA made aware through staff message.

## 2019-11-14 NOTE — Progress Notes (Signed)
Referring Physician(s): Dr. Starla Link per request from Dr. Prescott Gum  Supervising Physician: Sandi Mariscal  Patient Status:  [facility] - In-pt  Chief Complaint:  Right sided loculated pleural effusion - empyema  Subjective:  74 y,o. Male inpatient.  History of non small cell  lung cancer (right) admitted for [facility] and subjective fevers found to have a right sided loculated pleural effusion with concern for empyema. IR placed a chest tube on  6.7.21.   Allergies: Chlorhexidine and Aleve [naproxen sodium]  Medications: Prior to Admission medications   Medication Sig Start Date End Date Taking? Authorizing Provider  acetaminophen (TYLENOL) 325 MG tablet Take 2 tablets (650 mg total) by mouth every 6 (six) hours as needed for mild pain, fever or headache. 10/28/19  Yes Emokpae, Courage, MD  albuterol (VENTOLIN HFA) 108 (90 Base) MCG/ACT inhaler Inhale 2 puffs into the lungs every 6 (six) hours as needed for wheezing or shortness of breath.   Yes [provider]  diltiazem (CARDIZEM CD) 360 MG 24 hr capsule Take 1 capsule (360 mg total) by mouth daily. 10/29/19  Yes Emokpae, Courage, MD  doxycycline (MONODOX) 50 MG capsule Take 50 mg by mouth 2 (two) times daily as needed (rosacea flare ups.).   Yes [provider]  ferrous sulfate 325 (65 FE) MG tablet Take 325 mg by mouth daily with supper.   Yes [provider]  FLOVENT HFA 110 MCG/ACT inhaler Inhale 1 puff into the lungs 2 (two) times daily.  06/20/19  Yes [provider]  folic acid (FOLVITE) 1 MG tablet Take 1 mg by mouth daily.   Yes [provider]  gabapentin (NEURONTIN) 300 MG capsule Take 600 mg by mouth at bedtime.   Yes [provider]  lidocaine-prilocaine (EMLA) cream Apply to the Port-A-Cath site 30 minutes before treatment 08/26/19  Yes Curt Bears, MD  loratadine (CLARITIN) 10 MG tablet Take 10 mg by mouth daily.   Yes [provider]  metFORMIN (GLUCOPHAGE-XR) 500  MG 24 hr tablet Take 500-1,000 mg by mouth See admin instructions. Take 2 tablets (1000 mg) by mouth in the morning & 1 tablet (500 mg) by mouth at night. 10/15/18  Yes [provider]  metoprolol tartrate (LOPRESSOR) 25 MG tablet Take 1 tablet (25 mg total) by mouth 2 (two) times daily. 10/28/19  Yes Emokpae, Courage, MD  metroNIDAZOLE (METROCREAM) 0.75 % cream Apply 1 application topically 2 (two) times daily as needed (facial rosacea).    Yes [provider]  mometasone (ELOCON) 0.1 % cream Apply 1 application topically 2 (two) times daily as needed (psoriasis).    Yes [provider]  Omega-3 Fatty Acids (FISH OIL) 1000 MG CAPS Take 1,000 mg by mouth daily.    Yes [provider]  pantoprazole (PROTONIX) 40 MG tablet 1 po 30 mins prior to first meal 12/30/18  Yes Fields, Marga Melnick, MD  prochlorperazine (COMPAZINE) 10 MG tablet Take 1 tablet (10 mg total) by mouth every 6 (six) hours as needed for nausea or vomiting. 08/26/19  Yes Curt Bears, MD  senna-docusate (SENOKOT-S) 8.6-50 MG tablet Take 2 tablets by mouth at bedtime. 10/28/19 10/27/20 Yes Emokpae, Courage, MD  traMADol (ULTRAM) 50 MG tablet Take 50 mg by mouth every 4 (four) hours as needed for moderate pain. for pain 08/19/18  Yes [provider]  triamcinolone lotion (KENALOG) 0.1 % Apply 1 application topically 4 (four) times daily. 09/15/19  Yes Tanner, Lyndon Code., PA-C  umeclidinium-vilanterol [facility] ELLIPTA)  62.5-25 MCG/INH AEPB Inhale 1 puff into the lungs daily. 08/13/19  Yes Mariel Aloe, MD     Vital Signs: BP 118/75 (BP Location: Left Arm)   Pulse 92   Temp (!) 97.5 F (36.4 C) (Oral)   Resp 14   Ht 6\' 2"  (1.88 m)   Wt 179 lb 14.3 oz (81.6 kg)   SpO2 95%   BMI 23.10 kg/m   Physical Exam Vitals and nursing note reviewed.  Constitutional:      Appearance: He is well-developed.  HENT:     Head: Normocephalic.  Pulmonary:     Effort: Pulmonary effort is normal.     Comments:  Positive right sided chest tube  to atrium suction. Site is unremarkable with no erythema, edema, tenderness, bleeding or drainage noted at exit site. Suture and stat lock in place. Dressing is clean dry and intact. 200 ml of  yellow colored fluid noted in atrium. No air leak noted.     Musculoskeletal:        General: Normal range of motion.     Cervical back: Normal range of motion.  Skin:    General: Skin is dry.  Neurological:     Mental Status: He is alert and oriented to person, place, and time.     Imaging: DG Chest 2 View  Result Date: 11/13/2019 CLINICAL DATA:  Chest tube placement EXAM: CHEST - 2 VIEW COMPARISON:  11/08/2019 FINDINGS: Interval placement of right-sided pigtail pleural drainage catheter with significant decrease in the size of the loculated right-sided pleural effusion. Stable positioning right-sided chest port. Heart size is stable. Hazy reticulonodular opacity within the right lung base with improved aeration from prior. Left lung is clear. No pneumothorax. IMPRESSION: 1. Interval placement of right-sided pigtail pleural drainage catheter with significant decrease in size of the loculated right-sided pleural effusion. 2. Hazy reticulonodular opacity within the right lung base with improved aeration from prior. Electronically Signed   By: Davina Poke D.O.   On: 11/13/2019 14:51   CT IMAGE GUIDED DRAINAGE BY PERCUTANEOUS CATHETER  Result Date: 11/11/2019 INDICATION: 74 year old male with large right-sided pleural effusion with enhancing pleura concerning for empyema. EXAM: CT-guided chest tube placement MEDICATIONS: The patient is currently admitted to the hospital and receiving intravenous antibiotics. The antibiotics were administered within an appropriate time frame prior to the initiation of the procedure. ANESTHESIA/SEDATION: 1 mg Versed COMPLICATIONS: None immediate. PROCEDURE: Informed written consent was obtained from the patient after a thorough discussion  of the procedural risks, benefits and alternatives. All questions were addressed. A timeout was performed prior to the initiation of the procedure. An axial CT scan was performed. The large right-sided pleural effusion was identified. A suitable skin entry site was selected and marked. The overlying skin was sterilely prepped and draped in standard fashion using chlorhexidine skin prep. Local anesthesia was attained by infiltration with 1% lidocaine. A small dermatotomy was made. Using trocar technique, a 12 French drainage catheter was advanced into the pleural space and positioned superiorly within the pleural effusion. The drainage catheter was connected to low wall suction via a [facility] device at -20 cm H2O. The catheter was secured to the skin with 0 Prolene suture. An air type dressing was applied. There was immediate aspiration of copious amounts of turbid pleural fluid. IMPRESSION: Successful placement of 12 French right-sided pigtail thoracostomy tube. Electronically Signed   By: Jacqulynn Cadet M.D.   On: 11/11/2019 09:04    Labs:  CBC: Recent Labs  11/11/19 0453 11/12/19 0336 11/13/19 0450 11/14/19 0423  WBC 7.7 6.7 6.2 6.4  HGB 8.9* 8.4* 8.5* 8.6*  HCT 29.6* 28.0* 28.3* 28.0*  PLT 290 257 354 239    COAGS: Recent Labs    08/07/19 1927 10/19/19 1720 10/19/19 1750 11/08/19 2023  INR 1.1 1.0  --  1.1  APTT 33  --  34  --     BMP: Recent Labs    11/11/19 0453 11/12/19 0336 11/13/19 0450 11/14/19 0423  NA 136 135 135 135  K 3.5 3.6 3.2* 3.5  CL 101 98 99 97*  CO2 23 25 26 29   GLUCOSE 80 99 114* 123*  BUN 11 12 12 9   CALCIUM 7.8* 7.6* 7.9* 7.9*  CREATININE 0.54* 0.61 0.54* 0.58*  GFRNONAA >60 >60 >60 >60  GFRAA >60 >60 >60 >60    LIVER FUNCTION TESTS: Recent Labs    10/21/19 0541 10/22/19 0434 10/26/19 0721 11/05/19 0942 11/08/19 2023 11/09/19 1559  BILITOT 0.8  --   --  0.4 0.6 0.7  AST 19  --   --  17 22 16   ALT 21  --   --  20 23 20     ALKPHOS 51  --   --  67 68 59  PROT 6.3*  --   --  8.1 8.4* 7.5  ALBUMIN 2.0*   < > 2.3* 1.7* 2.2* 1.9*   < > = values in this interval not displayed.    Assessment and Plan:  74 y.o, male inpatient. History of non small cell  lung cancer (right) admitted for [facility] and subjective fevers found to have a right sided loculated pleural effusion with concern for empyema. IR placed a chest tube on  6.7.21. per Epic output has been  200 ml, 227ml, 1300 ml  Pertinent Imaging 6.11.21 - Chest xray   Pertinent IR History 6.7.21 - Right sided chest tube  Pertinent Allergies Chlorhexidine Aleve  All labs and medications are within acceptable parameters. Patient is afebrile.     IR will continue to follow along - plans per TCTS.    Electronically Signed: Avel Peace, NP 11/14/2019, 3:32 PM   I spent a total of 25 Minutes at the patient's bedside AND on the patient's hospital floor or unit, greater than 50% of which was counseling/coordinating care for chest tube right side

## 2019-11-14 NOTE — Care Management Important Message (Signed)
Important Message  Patient Details IM Letter given to Roque Lias SW Case Manager to present to the Patient Name: Jeremy [name] MRN: [id number] Date of Birth: 1945/07/03   Medicare Important Message Given:  Yes     Kerin Salen 11/14/2019, 11:24 AM

## 2019-11-14 NOTE — Progress Notes (Signed)
PROGRESS NOTE    Jeremy [name]    Code Status: DNR  URK:[id number] DOB: [birth year] DOA: 11/08/2019 LOS: 6 days  PCP: [name], MD CC:  Chief Complaint  Patient presents with  . Fever  . Cough  . [facility] Summary   This is a 74 year old male with history of stage IV lung cancer s/p palliative radiotherapy and now on chemotherapy, COPD with chronic hypoxic respiratory failure, paroxysmal atrial fibrillation not on anticoagulation, chronic anemia requiring intermittent transfusions and recent admission with sepsis secondary to pneumonia and treated with antibiotics who presented on 6/5 with fevers, right-sided chest pain and cough.  In the ED was tachycardic and CXR revealed stable loculated right pleural effusion with compressive atelectasis.  Outpatient CT of the chest was concerning for increased right pleural effusion with question of possible empyema.  He was started on broad-spectrum antibiotics and IR was consulted for possible ultrasound-guided thoracentesis unfortunately could not sit up for the procedure.  Cardiothoracic surgery evaluated the patient and recommended pigtail chest tube placement which was placed by IR on 6/7.    A & P   Principal Problem:   Loculated pleural effusion Active Problems:   COPD (chronic obstructive pulmonary disease) (HCC)   Type 2 diabetes mellitus (HCC)   HTN (hypertension)   Anemia in neoplastic disease   AF (paroxysmal atrial fibrillation) (HCC)   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HCC)   Acquired thrombophilia (HCC)   Recurrent right pleural effusion   Advanced care planning/counseling discussion   Palliative care by specialist   1. Right pleural effusion concerning for parapneumonic vs empyema vs. Malignant effusion a. Afebrile and hemodynamically stable on 2 L nasal cannula b. Currently with right-sided pigtail chest tube placement by IR on 6/7 c. Unfortunately, no cultures or labs obtained from the  chest tube/pleural fluid to help direct therapy. d. Still with chest tube output, IR requesting CT surgery eval. [facility]CS has been consulted today e. Repeat CXR f. continue on cefepime/metronidazole for now.   2. Stage IV lung cancer s/p palliative radiotherapy a. Follows with [name] as an outpatient b. Palliative and oncology have both been consulted  3. Chronic hypoxic respiratory failure/COPD, stable a. At baseline 2 L/min nasal cannula  4. Anemia of chronic disease, stable  5. Paroxysmal A. fib a. Not on anticoagulation due to transfusion dependent anemia b. Continue diltiazem and metoprolol  6. Hypokalemia resolved  7. Hypomagnesemia resolved  8. Type 2 diabetes with hyperglycemia a. HA1C 7 in March 2021 b. Continue sliding scale  9. Generalized deconditioning a. HH PT at discharge  DVT prophylaxis: SCDs Family Communication: Patient's wife at bedside has been updated  Disposition Plan: Need plan regarding chest tube and antibiotics duration at discharge Status is: Inpatient  Remains inpatient appropriate because:Inpatient level of care appropriate due to severity of illness   Dispo: The patient is from: Home              Anticipated d/c is to: Home              Anticipated d/c date is: 2 days              Patient currently is not medically stable to d/c.          Pressure injury documentation    None  Consultants  IR.   Prior hospitalist spoke to Dr. Ezra Sites surgery on phone on 11/08/2019.   Palliative care  Oncology [facility]  Procedures  Right pigtail chest tube placement on 11/10/2019 by IR  Antibiotics   Anti-infectives (From admission, onward)   Start     Dose/Rate Route Frequency Ordered Stop   11/09/19 1200  vancomycin (VANCOREADY) IVPB 1250 mg/250 mL  Status:  Discontinued        1,250 mg 166.7 mL/hr over 90 Minutes Intravenous Every 12 hours 11/09/19 0619 11/12/19 1252   11/09/19 0615  ceFEPIme (MAXIPIME) 2 g in sodium chloride 0.9 %  100 mL IVPB     Discontinue     2 g 200 mL/hr over 30 Minutes Intravenous Every 8 hours 11/09/19 0614     11/08/19 2300  metroNIDAZOLE (FLAGYL) IVPB 500 mg     Discontinue     500 mg 100 mL/hr over 60 Minutes Intravenous Every 8 hours 11/08/19 2255     11/08/19 2300  vancomycin (VANCOREADY) IVPB 1750 mg/350 mL        1,750 mg 175 mL/hr over 120 Minutes Intravenous  Once 11/08/19 2257 11/09/19 0123   11/08/19 2045  vancomycin (VANCOCIN) IVPB 1000 mg/200 mL premix  Status:  Discontinued        1,000 mg 200 mL/hr over 60 Minutes Intravenous  Once 11/08/19 2043 11/08/19 2256   11/08/19 2045  ceFEPIme (MAXIPIME) 2 g in sodium chloride 0.9 % 100 mL IVPB        2 g 200 mL/hr over 30 Minutes Intravenous  Once 11/08/19 2043 11/08/19 2322        Subjective   Frustrated that he is still hospitalized. Feels fine without any complaints otherwise.   Objective   Vitals:   11/13/19 2021 11/14/19 0347 11/14/19 0804 11/14/19 1349  BP:  119/76  118/75  Pulse:  78  92  Resp:  17  14  Temp:  98.1 F (36.7 C)  (!) 97.5 F (36.4 C)  TempSrc:    Oral  SpO2: 95% 93% 92% 95%  Weight:      Height:        Intake/Output Summary (Last 24 hours) at 11/14/2019 1511 Last data filed at 11/14/2019 0500 Gross per 24 hour  Intake 360 ml  Output 700 ml  Net -340 ml   Filed Weights   11/09/19 0500  Weight: 81.6 kg    Examination:  Physical Exam Vitals and nursing note reviewed.  Constitutional:      Appearance: Normal appearance.  HENT:     Head: Normocephalic and atraumatic.  Eyes:     Conjunctiva/sclera: Conjunctivae normal.  Cardiovascular:     Rate and Rhythm: Normal rate and regular rhythm.  Pulmonary:     Effort: Pulmonary effort is normal.     Comments: Chest tube in place with clear yellow fluid draining Abdominal:     General: Abdomen is flat.     Palpations: Abdomen is soft.  Musculoskeletal:        General: No swelling or tenderness.  Skin:    Coloration: Skin is not  jaundiced or pale.  Neurological:     Mental Status: He is alert. Mental status is at baseline.  Psychiatric:        Mood and Affect: Mood normal.        Behavior: Behavior normal.     Data Reviewed: I have personally reviewed following labs and imaging studies  CBC: Recent Labs  Lab 11/08/19 2023 11/08/19 2107 11/10/19 0224 11/11/19 0453 11/12/19 0336 11/13/19 0450 11/14/19 0423  WBC 8.7   < > 7.9 7.7  6.7 6.2 6.4  NEUTROABS 6.8  --   --   --   --   --   --   HGB 9.7*   < > 8.6* 8.9* 8.4* 8.5* 8.6*  HCT 32.6*   < > 28.4* 29.6* 28.0* 28.3* 28.0*  MCV 92.1   < > 89.9 92.5 91.5 91.6 91.5  PLT 346   < > 274 290 257 354 239   < > = values in this interval not displayed.   Basic Metabolic Panel: Recent Labs  Lab 11/10/19 0224 11/11/19 0453 11/12/19 0336 11/13/19 0450 11/14/19 0423  NA 135 136 135 135 135  K 3.1* 3.5 3.6 3.2* 3.5  CL 97* 101 98 99 97*  CO2 28 23 25 26 29   GLUCOSE 109* 80 99 114* 123*  BUN 12 11 12 12 9   CREATININE 0.60* 0.54* 0.61 0.54* 0.58*  CALCIUM 7.8* 7.8* 7.6* 7.9* 7.9*  MG 1.4* 1.5* 1.7 1.5* 1.9   GFR: Estimated Creatinine Clearance: 92.1 mL/min (A) (by C-G formula based on SCr of 0.58 mg/dL (L)). Liver Function Tests: Recent Labs  Lab 11/08/19 2023 11/09/19 1559  AST 22 16  ALT 23 20  ALKPHOS 68 59  BILITOT 0.6 0.7  PROT 8.4* 7.5  ALBUMIN 2.2* 1.9*   No results for input(s): LIPASE, AMYLASE in the last 168 hours. No results for input(s): AMMONIA in the last 168 hours. Coagulation Profile: Recent Labs  Lab 11/08/19 2023  INR 1.1   Cardiac Enzymes: No results for input(s): CKTOTAL, CKMB, CKMBINDEX, TROPONINI in the last 168 hours. BNP (last 3 results) No results for input(s): PROBNP in the last 8760 hours. HbA1C: No results for input(s): HGBA1C in the last 72 hours. CBG: Recent Labs  Lab 11/13/19 2009 11/14/19 0109 11/14/19 0349 11/14/19 0744 11/14/19 1151  GLUCAP 151* 104* 110* 116* 233*   Lipid Profile: No results  for input(s): CHOL, HDL, LDLCALC, TRIG, CHOLHDL, LDLDIRECT in the last 72 hours. Thyroid Function Tests: No results for input(s): TSH, T4TOTAL, FREET4, T3FREE, THYROIDAB in the last 72 hours. Anemia Panel: No results for input(s): VITAMINB12, FOLATE, FERRITIN, TIBC, IRON, RETICCTPCT in the last 72 hours. Sepsis Labs: Recent Labs  Lab 11/08/19 0118 11/08/19 2023  LATICACIDVEN 0.8 2.0*    Recent Results (from the past 240 hour(s))  SARS CORONAVIRUS 2 (TAT 6-24 HRS) Nasopharyngeal Nasopharyngeal Swab     Status: None   Collection Time: 11/08/19  1:32 PM   Specimen: Nasopharyngeal Swab  Result Value Ref Range Status   SARS Coronavirus 2 NEGATIVE NEGATIVE Final    Comment: (NOTE) SARS-CoV-2 target nucleic acids are NOT DETECTED. The SARS-CoV-2 RNA is generally detectable in upper and lower respiratory specimens during the acute phase of infection. Negative results do not preclude SARS-CoV-2 infection, do not rule out co-infections with other pathogens, and should not be used as the sole basis for treatment or other patient management decisions. Negative results must be combined with clinical observations, patient history, and epidemiological information. The expected result is Negative. Fact Sheet for Patients: SugarRoll.be Fact Sheet for Healthcare Providers: https://www.woods-mathews.com/ This test is not yet approved or cleared by the [place] FDA and  has been authorized for detection and/or diagnosis of SARS-CoV-2 by FDA under an Emergency Use Authorization (EUA). This EUA will remain  in effect (meaning this test can be used) for the duration of the COVID-19 declaration under Section 56 4(b)(1) of the Act, 21 U.S.C. section 360bbb-3(b)(1), unless the authorization is terminated or revoked sooner. Performed  at [facility], [place] [address], [place], [place] [address]   Culture, blood (Routine x 2)     Status: None    Collection Time: 11/08/19  8:23 PM   Specimen: BLOOD  Result Value Ref Range Status   Specimen Description   Final    BLOOD RIGHT ANTECUBITAL Performed at [facility] [address] [place], [place] [address]    Special Requests   Final    BOTTLES DRAWN AEROBIC AND ANAEROBIC Blood Culture adequate volume Performed at [facility] [address], [place], [place] [address]    Culture   Final    NO GROWTH 5 DAYS Performed at [facility], [place] [address] [place], [place] [address]    Report Status 11/14/2019 FINAL  Final  Urine Culture     Status: Abnormal   Collection Time: 11/08/19  8:24 PM   Specimen: Urine, Random  Result Value Ref Range Status   Specimen Description   Final    URINE, RANDOM Performed at [facility] [address], [place], [place] [address]    Special Requests   Final    NONE Performed at [facility], [place] [address], [place], [place] [address]    Culture 30,000 COLONIES/mL KLEBSIELLA PNEUMONIAE (A)  Final   Report Status 11/11/2019 FINAL  Final   Organism ID, Bacteria KLEBSIELLA PNEUMONIAE (A)  Final      Susceptibility   Klebsiella pneumoniae - MIC*    AMPICILLIN >=32 RESISTANT Resistant     CEFAZOLIN <=4 SENSITIVE Sensitive     CEFTRIAXONE <=1 SENSITIVE Sensitive     CIPROFLOXACIN <=0.25 SENSITIVE Sensitive     GENTAMICIN <=1 SENSITIVE Sensitive     IMIPENEM <=0.25 SENSITIVE Sensitive     NITROFURANTOIN 64 INTERMEDIATE Intermediate     TRIMETH/SULFA <=20 SENSITIVE Sensitive     AMPICILLIN/SULBACTAM 8 SENSITIVE Sensitive     PIP/TAZO <=4 SENSITIVE Sensitive     * 30,000 COLONIES/mL KLEBSIELLA PNEUMONIAE  Culture, blood (Routine x 2)     Status: None   Collection Time: 11/08/19  8:28 PM   Specimen: BLOOD  Result Value Ref Range Status   Specimen Description   Final    BLOOD LEFT ANTECUBITAL Performed at [facility] [address],  [place], [place] [address]    Special Requests   Final    BOTTLES DRAWN AEROBIC AND ANAEROBIC Blood Culture adequate volume Performed at [facility] [address], [place], [place] [address]    Culture   Final    NO GROWTH 5 DAYS Performed at [facility], [place] [address], [place], [place] [address]    Report Status 11/14/2019 FINAL  Final  MRSA PCR Screening     Status: None   Collection Time: 11/08/19 10:50 PM   Specimen: Nasal Mucosa; Nasopharyngeal  Result Value Ref Range Status   MRSA by PCR NEGATIVE NEGATIVE Final    Comment:        The GeneXpert MRSA Assay (FDA approved for NASAL specimens only), is one component of a comprehensive MRSA colonization surveillance program. It is not intended to diagnose MRSA infection nor to guide or monitor treatment for MRSA infections. Performed at [facility], [place] [address] [place], [place] [address]          Radiology Studies: DG Chest 2 View  Result Date: 11/13/2019 CLINICAL DATA:  Chest tube placement EXAM: CHEST - 2 VIEW  COMPARISON:  11/08/2019 FINDINGS: Interval placement of right-sided pigtail pleural drainage catheter with significant decrease in the size of the loculated right-sided pleural effusion. Stable positioning right-sided chest port. Heart size is stable. Hazy reticulonodular opacity within the right lung base with improved aeration from prior. Left lung is clear. No pneumothorax. IMPRESSION: 1. Interval placement of right-sided pigtail pleural drainage catheter with significant decrease in size of the loculated right-sided pleural effusion. 2. Hazy reticulonodular opacity within the right lung base with improved aeration from prior. Electronically Signed   By: Davina Poke D.O.   On: 11/13/2019 14:51        Scheduled Meds: . budesonide (PULMICORT) nebulizer solution  0.25 mg Nebulization BID  . diltiazem  360 mg Oral Daily  . feeding supplement (ENSURE ENLIVE)   237 mL Oral BID BM  . folic acid  1 mg Oral Daily  . gabapentin  600 mg Oral QHS  . insulin aspart  0-9 Units Subcutaneous Q4H  . metoprolol tartrate  25 mg Oral BID  . pantoprazole  40 mg Oral BID AC  . polyethylene glycol  17 g Oral Daily  . senna-docusate  2 tablet Oral QHS  . sodium chloride flush  10-40 mL Intracatheter Q12H  . sodium chloride flush  3 mL Intravenous Q12H  . tamsulosin  0.4 mg Oral Daily  . triamcinolone cream   Topical Daily  . umeclidinium-vilanterol  1 puff Inhalation Daily   Continuous Infusions: . ceFEPime (MAXIPIME) IV 2 g (11/14/19 1310)  . metronidazole 500 mg (11/14/19 0710)     Time spent: 31 minutes with over 50% of the time coordinating the patient's care    Harold Hedge, DO [facility] Pager [phone]  Call night coverage person covering after 7pm

## 2019-11-15 ENCOUNTER — Inpatient Hospital Stay (HOSPITAL_COMMUNITY): Payer: Medicare Other

## 2019-11-15 DIAGNOSIS — E119 Type 2 diabetes mellitus without complications: Secondary | ICD-10-CM

## 2019-11-15 DIAGNOSIS — J90 Pleural effusion, not elsewhere classified: Secondary | ICD-10-CM

## 2019-11-15 DIAGNOSIS — J449 Chronic obstructive pulmonary disease, unspecified: Secondary | ICD-10-CM

## 2019-11-15 LAB — MAGNESIUM: Magnesium: 1.5 mg/dL — ABNORMAL LOW (ref 1.7–2.4)

## 2019-11-15 LAB — GLUCOSE, CAPILLARY
Glucose-Capillary: 112 mg/dL — ABNORMAL HIGH (ref 70–99)
Glucose-Capillary: 151 mg/dL — ABNORMAL HIGH (ref 70–99)
Glucose-Capillary: 159 mg/dL — ABNORMAL HIGH (ref 70–99)
Glucose-Capillary: 192 mg/dL — ABNORMAL HIGH (ref 70–99)
Glucose-Capillary: 215 mg/dL — ABNORMAL HIGH (ref 70–99)
Glucose-Capillary: 258 mg/dL — ABNORMAL HIGH (ref 70–99)

## 2019-11-15 MED ORDER — MAGNESIUM SULFATE 4 GM/100ML IV SOLN
4.0000 g | Freq: Once | INTRAVENOUS | Status: AC
  Administered 2019-11-15: 4 g via INTRAVENOUS
  Filled 2019-11-15: qty 100

## 2019-11-15 MED ORDER — ALBUTEROL SULFATE (2.5 MG/3ML) 0.083% IN NEBU
2.5000 mg | INHALATION_SOLUTION | Freq: Three times a day (TID) | RESPIRATORY_TRACT | Status: DC
  Administered 2019-11-15 – 2019-11-18 (×9): 2.5 mg via RESPIRATORY_TRACT
  Filled 2019-11-15 (×9): qty 3

## 2019-11-15 MED ORDER — LEVOFLOXACIN 750 MG PO TABS
750.0000 mg | ORAL_TABLET | Freq: Every day | ORAL | Status: AC
  Administered 2019-11-15 – 2019-11-17 (×3): 750 mg via ORAL
  Filled 2019-11-15 (×3): qty 1

## 2019-11-15 MED ORDER — FUROSEMIDE 10 MG/ML IJ SOLN
40.0000 mg | Freq: Once | INTRAMUSCULAR | Status: AC
  Administered 2019-11-15: 40 mg via INTRAVENOUS
  Filled 2019-11-15: qty 4

## 2019-11-15 NOTE — Consult Note (Signed)
Reason for Consult:pleural effusion Referring Physician: TH  NATHANYEL [name] is an 74 y.o. male.  HPI: 74 yo man with metastatic lung cancer presented with fevers, right sided pleuritic CP and cough. Admitted with pneumonia. Had a large right pleural effusion. Admitted and started on IV antibiotics. Pigtail catheter placed and drained 1300 ml initially. Has drained about 200 ml a day since then. CXR markedly improved although there is still some opacity posteriorly. Symptoms have improved since admission.  Past Medical History:  Diagnosis Date  . Cancer ([place])    mass right lung  . COPD (chronic obstructive pulmonary disease) ([facility])    QUIT SMOKING 30 YRS AGO  . Diabetes ([place]) 2018  . HTN (hypertension)   . Psoriatic arthritis ([place]) 2015    Past Surgical History:  Procedure Laterality Date  . BACK SURGERY  1990  . BIOPSY  12/30/2018   Procedure: BIOPSY;  Surgeon: Danie Binder, MD;  [facility];  Service: Endoscopy;;  gastric  . CATARACT EXTRACTION Bilateral   . CERVICAL SPINE SURGERY  1995  . COLONOSCOPY WITH PROPOFOL N/A 12/30/2018   Procedure: COLONOSCOPY WITH PROPOFOL;  Surgeon: Danie Binder, MD;  [facility];  Service: Endoscopy;  Laterality: N/A;  12:30pm  . ESOPHAGOGASTRODUODENOSCOPY (EGD) WITH PROPOFOL N/A 12/30/2018   Procedure: ESOPHAGOGASTRODUODENOSCOPY (EGD) WITH PROPOFOL;  Surgeon: Danie Binder, MD;  [facility];  Service: Endoscopy;  Laterality: N/A;  . IR IMAGING GUIDED PORT INSERTION  09/04/2019  . POLYPECTOMY  12/30/2018   Procedure: POLYPECTOMY;  Surgeon: Danie Binder, MD;  [facility];  Service: Endoscopy;;  colon  . THORACENTESIS Right 08/08/2019   Procedure: Thoracentesis;  Surgeon: [name], MD;  Location: [place];  Service: Pulmonary;  Laterality: Right;  Marland Kitchen VIDEO BRONCHOSCOPY Right 08/08/2019   Procedure: Video Bronchoscopy with Erbe Cryo Biopsy of Right mainstem;  Surgeon: [name], MD;  Location: [facility]  OR;  Service: Pulmonary;  Laterality: Right;    Family History  Problem Relation Age of Onset  . Alcoholism Father   . CAD Brother   . Diabetes Brother   . Colon cancer Neg Hx   . Colon polyps Neg Hx   . Gastric cancer Neg Hx     Social History:  reports that he quit smoking about 27 years ago. His smoking use included cigarettes. He has a 25.00 pack-year smoking history. He has never used smokeless tobacco. He reports current alcohol use. He reports that he does not use drugs.  Allergies:  Allergies  Allergen Reactions  . Chlorhexidine   . Aleve [Naproxen Sodium] Hives and Rash    Medications:  Scheduled: . albuterol  2.5 mg Nebulization TID  . budesonide (PULMICORT) nebulizer solution  0.25 mg Nebulization BID  . diltiazem  360 mg Oral Daily  . feeding supplement (ENSURE ENLIVE)  237 mL Oral BID BM  . folic acid  1 mg Oral Daily  . gabapentin  600 mg Oral QHS  . insulin aspart  0-9 Units Subcutaneous Q4H  . levofloxacin  750 mg Oral Daily  . metoprolol tartrate  25 mg Oral BID  . pantoprazole  40 mg Oral BID AC  . polyethylene glycol  17 g Oral Daily  . senna-docusate  2 tablet Oral QHS  . sodium chloride flush  10-40 mL Intracatheter Q12H  . sodium chloride flush  3 mL Intravenous Q12H  . tamsulosin  0.4 mg Oral Daily  . triamcinolone cream  Topical Daily  . umeclidinium-vilanterol  1 puff Inhalation Daily    Results for orders placed or performed during the hospital encounter of 11/08/19 (from the past 48 hour(s))  Glucose, capillary     Status: Abnormal   Collection Time: 11/13/19  8:09 PM  Result Value Ref Range   Glucose-Capillary 151 (H) 70 - 99 mg/dL    Comment: Glucose reference range applies only to samples taken after fasting for at least 8 hours.  Glucose, capillary     Status: Abnormal   Collection Time: 11/14/19  1:09 AM  Result Value Ref Range   Glucose-Capillary 104 (H) 70 - 99 mg/dL    Comment: Glucose reference range applies only to samples  taken after fasting for at least 8 hours.  Glucose, capillary     Status: Abnormal   Collection Time: 11/14/19  3:49 AM  Result Value Ref Range   Glucose-Capillary 110 (H) 70 - 99 mg/dL    Comment: Glucose reference range applies only to samples taken after fasting for at least 8 hours.  Magnesium     Status: None   Collection Time: 11/14/19  4:23 AM  Result Value Ref Range   Magnesium 1.9 1.7 - 2.4 mg/dL    Comment: Performed at [facility], [place] [address], [place], [place] [address]  CBC     Status: Abnormal   Collection Time: 11/14/19  4:23 AM  Result Value Ref Range   WBC 6.4 4.0 - 10.5 K/uL   RBC 3.06 (L) 4.22 - 5.81 MIL/uL   Hemoglobin 8.6 (L) 13.0 - 17.0 g/dL   HCT 28.0 (L) 39 - 52 %   MCV 91.5 80.0 - 100.0 fL   MCH 28.1 26.0 - 34.0 pg   MCHC 30.7 30.0 - 36.0 g/dL   RDW 17.1 (H) 11.5 - 15.5 %   Platelets 239 150 - 400 K/uL   nRBC 0.0 0.0 - 0.2 %    Comment: Performed at [facility], [place] [address], [place], [place] [address]  Basic metabolic panel     Status: Abnormal   Collection Time: 11/14/19  4:23 AM  Result Value Ref Range   Sodium 135 135 - 145 mmol/L   Potassium 3.5 3.5 - 5.1 mmol/L   Chloride 97 (L) 98 - 111 mmol/L   CO2 29 22 - 32 mmol/L   Glucose, Bld 123 (H) 70 - 99 mg/dL    Comment: Glucose reference range applies only to samples taken after fasting for at least 8 hours.   BUN 9 8 - 23 mg/dL   Creatinine, Ser 0.58 (L) 0.61 - 1.24 mg/dL   Calcium 7.9 (L) 8.9 - 10.3 mg/dL   GFR calc non Af Amer >60 >60 mL/min   GFR calc Af Amer >60 >60 mL/min   Anion gap 9 5 - 15    Comment: Performed at [facility], [place] [address] [place], [place] [address]  Glucose, capillary     Status: Abnormal   Collection Time: 11/14/19  7:44 AM  Result Value Ref Range   Glucose-Capillary 116 (H) 70 - 99 mg/dL    Comment: Glucose reference range applies only to samples taken after fasting for at least 8 hours.  Glucose,  capillary     Status: Abnormal   Collection Time: 11/14/19 11:51 AM  Result Value Ref Range   Glucose-Capillary 233 (H) 70 - 99 mg/dL    Comment: Glucose reference range applies only to samples taken after fasting  for at least 8 hours.  Glucose, capillary     Status: Abnormal   Collection Time: 11/14/19  4:28 PM  Result Value Ref Range   Glucose-Capillary 129 (H) 70 - 99 mg/dL    Comment: Glucose reference range applies only to samples taken after fasting for at least 8 hours.  Glucose, capillary     Status: Abnormal   Collection Time: 11/14/19  8:16 PM  Result Value Ref Range   Glucose-Capillary 134 (H) 70 - 99 mg/dL    Comment: Glucose reference range applies only to samples taken after fasting for at least 8 hours.  Glucose, capillary     Status: Abnormal   Collection Time: 11/15/19 12:15 AM  Result Value Ref Range   Glucose-Capillary 112 (H) 70 - 99 mg/dL    Comment: Glucose reference range applies only to samples taken after fasting for at least 8 hours.  Magnesium     Status: Abnormal   Collection Time: 11/15/19  5:00 AM  Result Value Ref Range   Magnesium 1.5 (L) 1.7 - 2.4 mg/dL    Comment: Performed at [facility], [place] [address] [place], [place] [address]  Glucose, capillary     Status: Abnormal   Collection Time: 11/15/19  5:07 AM  Result Value Ref Range   Glucose-Capillary 215 (H) 70 - 99 mg/dL    Comment: Glucose reference range applies only to samples taken after fasting for at least 8 hours.  Glucose, capillary     Status: Abnormal   Collection Time: 11/15/19  8:37 AM  Result Value Ref Range   Glucose-Capillary 159 (H) 70 - 99 mg/dL    Comment: Glucose reference range applies only to samples taken after fasting for at least 8 hours.   Comment 1 Notify RN    Comment 2 Document in Chart   Glucose, capillary     Status: Abnormal   Collection Time: 11/15/19  1:01 PM  Result Value Ref Range   Glucose-Capillary 258 (H) 70 - 99 mg/dL    Comment:  Glucose reference range applies only to samples taken after fasting for at least 8 hours.   Comment 1 Notify RN    Comment 2 Document in Chart   Glucose, capillary     Status: Abnormal   Collection Time: 11/15/19  5:01 PM  Result Value Ref Range   Glucose-Capillary 192 (H) 70 - 99 mg/dL    Comment: Glucose reference range applies only to samples taken after fasting for at least 8 hours.   Comment 1 Notify RN    Comment 2 Document in Chart     DG CHEST PORT 1 VIEW  Result Date: 11/15/2019 CLINICAL DATA:  Pleural effusion EXAM: PORTABLE CHEST 1 VIEW COMPARISON:  11/14/2019 FINDINGS: Pigtail catheter is again identified on the right stable in appearance. Small residual pneumothorax is again seen. Underlying parenchymal opacity is again seen and stable. No significant increase in the degree of pleural fluid is noted. Right chest wall port is seen. Cardiac shadow is stable. Left lung remains clear. IMPRESSION: Stable chest stable appearance of the chest when compared with the previous day. Electronically Signed   By: Inez Catalina M.D.   On: 11/15/2019 15:09   DG CHEST PORT 1 VIEW  Result Date: 11/14/2019 CLINICAL DATA:  Chest tube in place. EXAM: PORTABLE CHEST 1 VIEW COMPARISON:  Radiograph yesterday.  CT 11/07/2019 FINDINGS: Right chest port remains in place. Right pigtail catheter in place. Partially loculated right pleural effusion appears similar  to yesterday, possible small partially loculated lateral pneumothorax. Persistent opacities throughout the right mid lower lung zone. Rightward mediastinal shift is unchanged. Left lung is clear. IMPRESSION: Right pigtail catheter remains in place with small volume of residual partially loculated pleural fluid. Possible small lateral loculated pneumothorax versus loculated fluid, recommend continued radiographic follow-up. Exam is otherwise unchanged from yesterday. Electronically Signed   By: Keith Rake M.D.   On: 11/14/2019 16:45    Review of  Systems  Constitutional: Positive for chills and fever.  Respiratory: Positive for cough and shortness of breath.    Blood pressure 117/80, pulse 88, temperature 97.6 F (36.4 C), temperature source Oral, resp. rate 18, height 6\' 2"  (1.88 m), weight 81.6 kg, SpO2 92 %. Physical Exam  Constitutional: He is oriented to person, place, and time. No distress.  Frail appearing  HENT:  Head: Normocephalic and atraumatic.  Cardiovascular: Normal rate and regular rhythm.  No murmur heard. Respiratory: No respiratory distress. He has no wheezes.  Neurological: He is oriented to person, place, and time. No cranial nerve deficit.  Skin: Skin is warm and dry.    Assessment/Plan: 74 yo man with metastatic lung cancer with a preexisting right pleural effusion who was admitted with pneumonia. Drain placed in right pleural space and removed about 1300 ml of fluid on day 1.  He continues to drain about 200 ml of serous fluid a day and that is unlikely to Recommend change. Does not have complete reexpansion of lung so I doubt talc pleurodesis will be of much help.   I recommend removing the chest tube and then follow clinically. If the fluid recurs we could consider a pleur-X catheter for long term management.  Melrose Nakayama 11/15/2019, 5:19 PM

## 2019-11-15 NOTE — Progress Notes (Signed)
PROGRESS NOTE    Jeremy [name]    Code Status: DNR  ZSW:[id number] DOB: Oct 08, 1945 DOA: 11/08/2019 LOS: 7 days  PCP: [name], MD CC:  Chief Complaint  Patient presents with  . Fever  . Cough  . [facility] Summary   This is a 74 year old male with history of stage IV lung cancer s/p palliative radiotherapy and now on chemotherapy, COPD with chronic hypoxic respiratory failure, paroxysmal atrial fibrillation not on anticoagulation, chronic anemia requiring intermittent transfusions and recent admission with sepsis secondary to pneumonia and treated with antibiotics who presented on 6/5 with fevers, right-sided chest pain and cough.  In the ED was tachycardic and CXR revealed stable loculated right pleural effusion with compressive atelectasis.  Outpatient CT of the chest was concerning for increased right pleural effusion with question of possible empyema.  He was started on broad-spectrum antibiotics and IR was consulted for possible ultrasound-guided thoracentesis unfortunately could not sit up for the procedure.  Cardiothoracic surgery evaluated the patient and recommended pigtail chest tube placement which was placed by IR on 6/7.  6/12: PCCM consulted as patient continued to have drainage   A & P   Principal Problem:   Loculated pleural effusion Active Problems:   COPD (chronic obstructive pulmonary disease) (HCC)   Type 2 diabetes mellitus (HCC)   HTN (hypertension)   Anemia in neoplastic disease   AF (paroxysmal atrial fibrillation) (HCC)   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HCC)   Acquired thrombophilia ([place])   Recurrent right pleural effusion   Advanced care planning/counseling discussion   Palliative care by specialist   1. Right pleural effusion concerning for parapneumonic vs empyema (most likely) vs. Malignant effusion a. Afebrile and hemodynamically stable on 2 L nasal cannula b. Currently with right-sided pigtail chest tube  placement by IR on 6/7  c. Unfortunately, no cultures or labs obtained from the chest tube/pleural fluid to help direct therapy. d. Still with chest tube output e. CT CS consulted who did not recommend any further surgical intervention recommended continued chest tube to suction  f. Tolerating p.o. intake, will change antibiotics to Levaquin for total 14 days antibiotics at least g. Lasix x1 h. IR on board i. PCCM consulted j. Repeat CXR  2. Stage IV lung cancer s/p palliative radiotherapy a. Follows with [name] as an outpatient b. Palliative and oncology have both been consulted  3. Chronic hypoxic respiratory failure/COPD, stable a. At baseline 2 L/min nasal cannula  4. Anemia of chronic disease, stable  5. Paroxysmal A. fib a. Not on anticoagulation due to transfusion dependent anemia b. Continue diltiazem and metoprolol  6. Hypokalemia resolved  7. Hypomagnesemia resolved  8. Type 2 diabetes with hyperglycemia a. HA1C 7 in March 2021 b. Continue sliding scale  9. Generalized deconditioning a. HH PT at discharge  DVT prophylaxis: SCDs Start: 11/09/19 0608  Family Communication: Patient's wife at bedside has been updated  Disposition Plan: Needs chest tube removed prior to discharge Status is: Inpatient  Remains inpatient appropriate because:Inpatient level of care appropriate due to severity of illness   Dispo: The patient is from: Home              Anticipated d/c is to: Home              Anticipated d/c date is: 2 days              Patient currently is not  medically stable to d/c.          Pressure injury documentation    None  Consultants  IR.   Prior hospitalist spoke to Dr. Ezra Sites surgery on phone on 11/08/2019.   Palliative care Oncology [facility] PCCM  Procedures  Right pigtail chest tube placement on 11/10/2019 by IR  Antibiotics   Anti-infectives (From admission, onward)   Start     Dose/Rate Route Frequency Ordered Stop    11/15/19 1100  levofloxacin (LEVAQUIN) tablet 750 mg     Discontinue     750 mg Oral Daily 11/15/19 1039 11/18/19 0959   11/09/19 1200  vancomycin (VANCOREADY) IVPB 1250 mg/250 mL  Status:  Discontinued        1,250 mg 166.7 mL/hr over 90 Minutes Intravenous Every 12 hours 11/09/19 0619 11/12/19 1252   11/09/19 0615  ceFEPIme (MAXIPIME) 2 g in sodium chloride 0.9 % 100 mL IVPB  Status:  Discontinued        2 g 200 mL/hr over 30 Minutes Intravenous Every 8 hours 11/09/19 0614 11/15/19 1039   11/08/19 2300  metroNIDAZOLE (FLAGYL) IVPB 500 mg  Status:  Discontinued        500 mg 100 mL/hr over 60 Minutes Intravenous Every 8 hours 11/08/19 2255 11/15/19 1039   11/08/19 2300  vancomycin (VANCOREADY) IVPB 1750 mg/350 mL        1,750 mg 175 mL/hr over 120 Minutes Intravenous  Once 11/08/19 2257 11/09/19 0123   11/08/19 2045  vancomycin (VANCOCIN) IVPB 1000 mg/200 mL premix  Status:  Discontinued        1,000 mg 200 mL/hr over 60 Minutes Intravenous  Once 11/08/19 2043 11/08/19 2256   11/08/19 2045  ceFEPIme (MAXIPIME) 2 g in sodium chloride 0.9 % 100 mL IVPB        2 g 200 mL/hr over 30 Minutes Intravenous  Once 11/08/19 2043 11/08/19 2322        Subjective   Patient and wife at bedside are still frustrated he is still hospitalized but understand that he continues to require the chest tube.  Denies any complaints and no overnight events.  Objective   Vitals:   11/15/19 0756 11/15/19 0758 11/15/19 1351 11/15/19 1433  BP:   117/80   Pulse:   88   Resp:   18   Temp:   97.6 F (36.4 C)   TempSrc:   Oral   SpO2: 90% 90% 99% 92%  Weight:      Height:        Intake/Output Summary (Last 24 hours) at 11/15/2019 1543 Last data filed at 11/15/2019 1100 Gross per 24 hour  Intake 120 ml  Output 1665 ml  Net -1545 ml   Filed Weights   11/09/19 0500  Weight: 81.6 kg    Examination:  Physical Exam Vitals and nursing note reviewed.  Constitutional:      Appearance: Normal  appearance.  HENT:     Head: Normocephalic and atraumatic.  Eyes:     Conjunctiva/sclera: Conjunctivae normal.  Cardiovascular:     Rate and Rhythm: Normal rate and regular rhythm.  Pulmonary:     Effort: Pulmonary effort is normal.     Breath sounds: Examination of the right-lower field reveals decreased breath sounds. Decreased breath sounds present.  Abdominal:     General: Abdomen is flat.     Palpations: Abdomen is soft.  Musculoskeletal:        General: No swelling or tenderness.  Skin:  Coloration: Skin is not jaundiced or pale.  Neurological:     Mental Status: He is alert. Mental status is at baseline.  Psychiatric:        Mood and Affect: Mood normal.        Behavior: Behavior normal.     Data Reviewed: I have personally reviewed following labs and imaging studies  CBC: Recent Labs  Lab 11/08/19 2023 11/08/19 2107 11/10/19 0224 11/11/19 0453 11/12/19 0336 11/13/19 0450 11/14/19 0423  WBC 8.7   < > 7.9 7.7 6.7 6.2 6.4  NEUTROABS 6.8  --   --   --   --   --   --   HGB 9.7*   < > 8.6* 8.9* 8.4* 8.5* 8.6*  HCT 32.6*   < > 28.4* 29.6* 28.0* 28.3* 28.0*  MCV 92.1   < > 89.9 92.5 91.5 91.6 91.5  PLT 346   < > 274 290 257 354 239   < > = values in this interval not displayed.   Basic Metabolic Panel: Recent Labs  Lab 11/10/19 0224 11/10/19 0224 11/11/19 0453 11/12/19 0336 11/13/19 0450 11/14/19 0423 11/15/19 0500  NA 135  --  136 135 135 135  --   K 3.1*  --  3.5 3.6 3.2* 3.5  --   CL 97*  --  101 98 99 97*  --   CO2 28  --  23 25 26 29   --   GLUCOSE 109*  --  80 99 114* 123*  --   BUN 12  --  11 12 12 9   --   CREATININE 0.60*  --  0.54* 0.61 0.54* 0.58*  --   CALCIUM 7.8*  --  7.8* 7.6* 7.9* 7.9*  --   MG 1.4*   < > 1.5* 1.7 1.5* 1.9 1.5*   < > = values in this interval not displayed.   GFR: Estimated Creatinine Clearance: 92.1 mL/min (A) (by C-G formula based on SCr of 0.58 mg/dL (L)). Liver Function Tests: Recent Labs  Lab 11/08/19 2023  11/09/19 1559  AST 22 16  ALT 23 20  ALKPHOS 68 59  BILITOT 0.6 0.7  PROT 8.4* 7.5  ALBUMIN 2.2* 1.9*   No results for input(s): LIPASE, AMYLASE in the last 168 hours. No results for input(s): AMMONIA in the last 168 hours. Coagulation Profile: Recent Labs  Lab 11/08/19 2023  INR 1.1   Cardiac Enzymes: No results for input(s): CKTOTAL, CKMB, CKMBINDEX, TROPONINI in the last 168 hours. BNP (last 3 results) No results for input(s): PROBNP in the last 8760 hours. HbA1C: No results for input(s): HGBA1C in the last 72 hours. CBG: Recent Labs  Lab 11/14/19 2016 11/15/19 0015 11/15/19 0507 11/15/19 0837 11/15/19 1301  GLUCAP 134* 112* 215* 159* 258*   Lipid Profile: No results for input(s): CHOL, HDL, LDLCALC, TRIG, CHOLHDL, LDLDIRECT in the last 72 hours. Thyroid Function Tests: No results for input(s): TSH, T4TOTAL, FREET4, T3FREE, THYROIDAB in the last 72 hours. Anemia Panel: No results for input(s): VITAMINB12, FOLATE, FERRITIN, TIBC, IRON, RETICCTPCT in the last 72 hours. Sepsis Labs: Recent Labs  Lab 11/08/19 2023  LATICACIDVEN 2.0*    Recent Results (from the past 240 hour(s))  SARS CORONAVIRUS 2 (TAT 6-24 HRS) Nasopharyngeal Nasopharyngeal Swab     Status: None   Collection Time: 11/08/19  1:32 PM   Specimen: Nasopharyngeal Swab  Result Value Ref Range Status   SARS Coronavirus 2 NEGATIVE NEGATIVE Final    Comment: (NOTE) SARS-CoV-2  target nucleic acids are NOT DETECTED. The SARS-CoV-2 RNA is generally detectable in upper and lower respiratory specimens during the acute phase of infection. Negative results do not preclude SARS-CoV-2 infection, do not rule out co-infections with other pathogens, and should not be used as the sole basis for treatment or other patient management decisions. Negative results must be combined with clinical observations, patient history, and epidemiological information. The expected result is Negative. Fact Sheet for  Patients: SugarRoll.be Fact Sheet for Healthcare Providers: https://www.woods-mathews.com/ This test is not yet approved or cleared by the [place] FDA and  has been authorized for detection and/or diagnosis of SARS-CoV-2 by FDA under an Emergency Use Authorization (EUA). This EUA will remain  in effect (meaning this test can be used) for the duration of the COVID-19 declaration under Section 56 4(b)(1) of the Act, 21 U.S.C. section 360bbb-3(b)(1), unless the authorization is terminated or revoked sooner. Performed at [facility], [place] [address], [place], [place] [address]   Culture, blood (Routine x 2)     Status: None   Collection Time: 11/08/19  8:23 PM   Specimen: BLOOD  Result Value Ref Range Status   Specimen Description   Final    BLOOD RIGHT ANTECUBITAL Performed at [facility] [address], [place], [place] [address]    Special Requests   Final    BOTTLES DRAWN AEROBIC AND ANAEROBIC Blood Culture adequate volume Performed at [facility] [address], [place], [place] [address]    Culture   Final    NO GROWTH 5 DAYS Performed at [facility], [place] [address], [place], [place] [address]    Report Status 11/14/2019 FINAL  Final  Urine Culture     Status: Abnormal   Collection Time: 11/08/19  8:24 PM   Specimen: Urine, Random  Result Value Ref Range Status   Specimen Description   Final    URINE, RANDOM Performed at [facility] [address], [place], [place] [address]    Special Requests   Final    NONE Performed at [facility], [place] [address] [place], [place] [address]    Culture 30,000 COLONIES/mL KLEBSIELLA PNEUMONIAE (A)  Final   Report Status 11/11/2019 FINAL  Final   Organism ID, Bacteria KLEBSIELLA PNEUMONIAE (A)  Final      Susceptibility   Klebsiella pneumoniae - MIC*    AMPICILLIN >=32 RESISTANT Resistant      CEFAZOLIN <=4 SENSITIVE Sensitive     CEFTRIAXONE <=1 SENSITIVE Sensitive     CIPROFLOXACIN <=0.25 SENSITIVE Sensitive     GENTAMICIN <=1 SENSITIVE Sensitive     IMIPENEM <=0.25 SENSITIVE Sensitive     NITROFURANTOIN 64 INTERMEDIATE Intermediate     TRIMETH/SULFA <=20 SENSITIVE Sensitive     AMPICILLIN/SULBACTAM 8 SENSITIVE Sensitive     PIP/TAZO <=4 SENSITIVE Sensitive     * 30,000 COLONIES/mL KLEBSIELLA PNEUMONIAE  Culture, blood (Routine x 2)     Status: None   Collection Time: 11/08/19  8:28 PM   Specimen: BLOOD  Result Value Ref Range Status   Specimen Description   Final    BLOOD LEFT ANTECUBITAL Performed at [facility] [address] [place] [address], [place], [place] [address]    Special Requests   Final    BOTTLES DRAWN AEROBIC AND ANAEROBIC Blood Culture adequate volume Performed at [facility] [address], [place], [place] [address]    Culture   Final  NO GROWTH 5 DAYS Performed at [facility], [place] [address], [place], [place] [address]    Report Status 11/14/2019 FINAL  Final  MRSA PCR Screening     Status: None   Collection Time: 11/08/19 10:50 PM   Specimen: Nasal Mucosa; Nasopharyngeal  Result Value Ref Range Status   MRSA by PCR NEGATIVE NEGATIVE Final    Comment:        The GeneXpert MRSA Assay (FDA approved for NASAL specimens only), is one component of a comprehensive MRSA colonization surveillance program. It is not intended to diagnose MRSA infection nor to guide or monitor treatment for MRSA infections. Performed at [facility], [place] [address], [place],  [address]          Radiology Studies: DG CHEST PORT 1 VIEW  Result Date: 11/15/2019 CLINICAL DATA:  Pleural effusion EXAM: PORTABLE CHEST 1 VIEW COMPARISON:  11/14/2019 FINDINGS: Pigtail catheter is again identified on the right stable in appearance. Small residual pneumothorax is again seen. Underlying parenchymal opacity is  again seen and stable. No significant increase in the degree of pleural fluid is noted. Right chest wall port is seen. Cardiac shadow is stable. Left lung remains clear. IMPRESSION: Stable chest stable appearance of the chest when compared with the previous day. Electronically Signed   By: Inez Catalina M.D.   On: 11/15/2019 15:09   DG CHEST PORT 1 VIEW  Result Date: 11/14/2019 CLINICAL DATA:  Chest tube in place. EXAM: PORTABLE CHEST 1 VIEW COMPARISON:  Radiograph yesterday.  CT 11/07/2019 FINDINGS: Right chest port remains in place. Right pigtail catheter in place. Partially loculated right pleural effusion appears similar to yesterday, possible small partially loculated lateral pneumothorax. Persistent opacities throughout the right mid lower lung zone. Rightward mediastinal shift is unchanged. Left lung is clear. IMPRESSION: Right pigtail catheter remains in place with small volume of residual partially loculated pleural fluid. Possible small lateral loculated pneumothorax versus loculated fluid, recommend continued radiographic follow-up. Exam is otherwise unchanged from yesterday. Electronically Signed   By: Keith Rake M.D.   On: 11/14/2019 16:45        Scheduled Meds: . albuterol  2.5 mg Nebulization TID  . budesonide (PULMICORT) nebulizer solution  0.25 mg Nebulization BID  . diltiazem  360 mg Oral Daily  . feeding supplement (ENSURE ENLIVE)  237 mL Oral BID BM  . folic acid  1 mg Oral Daily  . gabapentin  600 mg Oral QHS  . insulin aspart  0-9 Units Subcutaneous Q4H  . levofloxacin  750 mg Oral Daily  . metoprolol tartrate  25 mg Oral BID  . pantoprazole  40 mg Oral BID AC  . polyethylene glycol  17 g Oral Daily  . senna-docusate  2 tablet Oral QHS  . sodium chloride flush  10-40 mL Intracatheter Q12H  . sodium chloride flush  3 mL Intravenous Q12H  . tamsulosin  0.4 mg Oral Daily  . triamcinolone cream   Topical Daily  . umeclidinium-vilanterol  1 puff Inhalation Daily    Continuous Infusions:    Time spent: 33 minutes with over 50% of the time coordinating the patient's care    Harold Hedge, DO [facility] Pager [phone]  Call night coverage person covering after 7pm

## 2019-11-15 NOTE — Progress Notes (Signed)
Pharmacy Antibiotic Note  Jeremy [name] is a 74 y.o. male admitted on 11/08/2019 with empyema.  Pharmacy has been consulted for Cefepime dosing.  11/15/2019:  Day 7 Abxs  Afebrile  WBC WNL, Scr at baseline/stable  Cx data: UCx 20k kleb pneumo  Plan: -Continue Cefepime 2gm IV q8h -Continue Flagyl 500mg  IV q8h per MD -Duration of therapy per MD>>consider switch to oral agent if clinically appropriate -No dose adjustments anticipated.  Pharmacy to sign-off.  Please re-consult if needed.  Height: 6\' 2"  (188 cm) Weight: 81.6 kg (179 lb 14.3 oz) IBW/kg (Calculated) : 82.2  Temp (24hrs), Avg:98 F (36.7 C), Min:97.5 F (36.4 C), Max:98.3 F (36.8 C)  Recent Labs  Lab 11/08/19 2023 11/08/19 2107 11/10/19 0224 11/11/19 0453 11/12/19 0336 11/13/19 0450 11/14/19 0423  WBC 8.7   < > 7.9 7.7 6.7 6.2 6.4  CREATININE 0.90   < > 0.60* 0.54* 0.61 0.54* 0.58*  LATICACIDVEN 2.0*  --   --   --   --   --   --    < > = values in this interval not displayed.    Estimated Creatinine Clearance: 92.1 mL/min (A) (by C-G formula based on SCr of 0.58 mg/dL (L)).    Allergies  Allergen Reactions  . Chlorhexidine   . Aleve [Naproxen Sodium] Hives and Rash    Antimicrobials this admission: Vancomycin 11/08/2019 >>6/9 Cefepime 11/08/2019 >>  Flagyl 6/5 >>   Dose adjustments this admission: -  Microbiology results: 6/5 BCx: ngtd 6/5 UCx: 30K Kleb pneumo (R amp, I nitro) 6/5 MRSA PCR: Negative 6/6 Urine strep pneumo: Negative 6/6 Legionella: negative  Thank you for allowing pharmacy to be a part of this patient's care.  [name] PharmD, BCPS 11/15/2019 10:12 AM

## 2019-11-16 ENCOUNTER — Inpatient Hospital Stay (HOSPITAL_COMMUNITY): Payer: Medicare Other

## 2019-11-16 DIAGNOSIS — D6869 Other thrombophilia: Secondary | ICD-10-CM

## 2019-11-16 DIAGNOSIS — Z7189 Other specified counseling: Secondary | ICD-10-CM

## 2019-11-16 LAB — CBC
HCT: 28.1 % — ABNORMAL LOW (ref 39.0–52.0)
Hemoglobin: 8.5 g/dL — ABNORMAL LOW (ref 13.0–17.0)
MCH: 27.1 pg (ref 26.0–34.0)
MCHC: 30.2 g/dL (ref 30.0–36.0)
MCV: 89.5 fL (ref 80.0–100.0)
Platelets: 267 10*3/uL (ref 150–400)
RBC: 3.14 MIL/uL — ABNORMAL LOW (ref 4.22–5.81)
RDW: 17.5 % — ABNORMAL HIGH (ref 11.5–15.5)
WBC: 8.8 10*3/uL (ref 4.0–10.5)
nRBC: 0 % (ref 0.0–0.2)

## 2019-11-16 LAB — GLUCOSE, CAPILLARY
Glucose-Capillary: 106 mg/dL — ABNORMAL HIGH (ref 70–99)
Glucose-Capillary: 118 mg/dL — ABNORMAL HIGH (ref 70–99)
Glucose-Capillary: 102 mg/dL — ABNORMAL HIGH (ref 70–99)
Glucose-Capillary: 230 mg/dL — ABNORMAL HIGH (ref 70–99)
Glucose-Capillary: 211 mg/dL — ABNORMAL HIGH (ref 70–99)
Glucose-Capillary: 179 mg/dL — ABNORMAL HIGH (ref 70–99)

## 2019-11-16 LAB — BASIC METABOLIC PANEL
Anion gap: 10 (ref 5–15)
BUN: 14 mg/dL (ref 8–23)
CO2: 32 mmol/L (ref 22–32)
Calcium: 7.9 mg/dL — ABNORMAL LOW (ref 8.9–10.3)
Chloride: 95 mmol/L — ABNORMAL LOW (ref 98–111)
Creatinine, Ser: 0.68 mg/dL (ref 0.61–1.24)
GFR calc Af Amer: >60 mL/min (ref >60)
GFR calc non Af Amer: >60 mL/min (ref >60)
Glucose, Bld: 125 mg/dL — ABNORMAL HIGH (ref 70–99)
Potassium: 3.3 mmol/L — ABNORMAL LOW (ref 3.5–5.1)
Sodium: 137 mmol/L (ref 135–145)

## 2019-11-16 LAB — MAGNESIUM: Magnesium: 2 mg/dL (ref 1.7–2.4)

## 2019-11-16 MED ORDER — INSULIN ASPART 100 UNIT/ML ~~LOC~~ SOLN
0.0000 [IU] | Freq: Three times a day (TID) | SUBCUTANEOUS | Status: DC
  Administered 2019-11-16 – 2019-11-17 (×2): 2 [IU] via SUBCUTANEOUS
  Administered 2019-11-17 (×2): 3 [IU] via SUBCUTANEOUS
  Administered 2019-11-18: 1 [IU] via SUBCUTANEOUS

## 2019-11-16 MED ORDER — POTASSIUM CHLORIDE CRYS ER 20 MEQ PO TBCR
20.0000 meq | EXTENDED_RELEASE_TABLET | Freq: Once | ORAL | Status: AC
  Administered 2019-11-16: 20 meq via ORAL
  Filled 2019-11-16: qty 1

## 2019-11-16 NOTE — Consult Note (Addendum)
NAME:  Jeremy [name], MRN:  [id number], DOB:  Apr 08, 1946, LOS: 8 ADMISSION DATE:  11/08/2019, CONSULTATION DATE: June 13 REFERRING MD: Dr. Sonny Masters, CHIEF COMPLAINT: Dyspnea  Brief History   74 year old male with a history of lung cancer who presented with a right pleural effusion, pulmonary and critical care medicine consulted for chest tube management.  History of present illness   This is a 74 year old male who was admitted to this facility on June 5 in the setting of fevers right-sided chest pain and cough.  He has a past medical history significant for stage IV lung cancer and has received palliative radiotherapy and chemotherapy.  He has also been told that he has COPD as well as chronic respiratory failure with hypoxemia.  In 2021 he has had various CT scans performed and in June of this year a right-sided pleural effusion was noted to be significantly larger than had been seen previously.  On films earlier in the year there was evidence of necrotic changes in the right lower lobe.  He and his wife provide the history jointly.  They state that he had fevers at home, was coughing up more mucus than normal.  He does have a cough with clear mucus production on a regular basis but he has been experiencing this a bit more prior to admission.  He had a chest tube placed and he now feels much better.  The pain in the right chest has resolved.  He drained about 1300 cc of fluid on day 1 and has drained about 200 cc daily up until June 11.  Over the weekend he said very little fluid removal.  Past Medical History  Hypertension COPD Stage IV lung cancer Diabetes mellitus  Significant Hospital Events   June 5 admitted June 7 CT-guided chest drain placed  Consults:  Cardiothoracic surgery Pulmonary Interventional radiology  Procedures:  June 7 CT-guided chest drain  Significant Diagnostic Tests:    Micro Data:  June 5 SARS COV 2 > negative June 5 blood culture June 10 body fluid culture,  not sent June 5 urine culture: Klebsiella pneumonia  Antimicrobials:  6/5 cefepime > 6/11 6/5 flagyl > 6/12 6/5 vanc > 6/9 6/12 levaquin >   Interim history/subjective:  As above  Objective   Blood pressure 118/80, pulse 90, temperature (!) 97.5 F (36.4 C), temperature source Oral, resp. rate 19, height 6\' 2"  (1.88 m), weight 74.3 kg, SpO2 (!) 89 %.        Intake/Output Summary (Last 24 hours) at 11/16/2019 0942 Last data filed at 11/16/2019 0839 Gross per 24 hour  Intake 493 ml  Output 1865 ml  Net -1372 ml   Filed Weights   11/09/19 0500 11/16/19 0355  Weight: 81.6 kg 74.3 kg    Examination:  General:  Resting comfortably in bed HENT: NCAT OP clear PULM: Diminished left lung B, normal effort CV: RRR, no mgr GI: BS+, soft, nontender MSK: normal bulk and tone Neuro: awake, alert, no distress, MAEW  6/12 CXR > pigtail catheter in place, right lung not fully expanded, trace pleural effusion base   Resolved Hospital Problem list     Assessment & Plan:  Right-sided pleural effusion in the setting of necrotic appearing right lower lobe from April 2021 CT chest, known lung cancer.  Unfortunately we do not have either a pleural fluid cytology or culture to go on.  However, considering the necrotic appearance of the right lower lobe on the April 2021 PET scan, his increased cough  with mucus production and fever I think it safest to assume that this was an empyema. > Agree with removing chest tube > Would continue antibiotics for 14-day course, Levaquin should be fine, an alternative may be Augmentin if he is intolerant of the levaquin > will follow to help manage if pleural fluid recurs  Best practice:    Labs   CBC: Recent Labs  Lab 11/11/19 0453 11/12/19 0336 11/13/19 0450 11/14/19 0423 11/16/19 0307  WBC 7.7 6.7 6.2 6.4 8.8  HGB 8.9* 8.4* 8.5* 8.6* 8.5*  HCT 29.6* 28.0* 28.3* 28.0* 28.1*  MCV 92.5 91.5 91.6 91.5 89.5  PLT 290 257 354 239 267    Basic  Metabolic Panel: Recent Labs  Lab 11/11/19 0453 11/11/19 0453 11/12/19 0336 11/13/19 0450 11/14/19 0423 11/15/19 0500 11/16/19 0307  NA 136  --  135 135 135  --  137  K 3.5  --  3.6 3.2* 3.5  --  3.3*  CL 101  --  98 99 97*  --  95*  CO2 23  --  25 26 29   --  32  GLUCOSE 80  --  99 114* 123*  --  125*  BUN 11  --  12 12 9   --  14  CREATININE 0.54*  --  0.61 0.54* 0.58*  --  0.68  CALCIUM 7.8*  --  7.6* 7.9* 7.9*  --  7.9*  MG 1.5*   < > 1.7 1.5* 1.9 1.5* 2.0   < > = values in this interval not displayed.   GFR: Estimated Creatinine Clearance: 83.8 mL/min (by C-G formula based on SCr of 0.68 mg/dL). Recent Labs  Lab 11/12/19 0336 11/13/19 0450 11/14/19 0423 11/16/19 0307  WBC 6.7 6.2 6.4 8.8    Liver Function Tests: Recent Labs  Lab 11/09/19 1559  AST 16  ALT 20  ALKPHOS 59  BILITOT 0.7  PROT 7.5  ALBUMIN 1.9*   No results for input(s): LIPASE, AMYLASE in the last 168 hours. No results for input(s): AMMONIA in the last 168 hours.  ABG    Component Value Date/Time   HCO3 28.7 (H) 08/06/2019 1350   TCO2 30 11/08/2019 2107   O2SAT 31.6 08/06/2019 1350     Coagulation Profile: No results for input(s): INR, PROTIME in the last 168 hours.  Cardiac Enzymes: No results for input(s): CKTOTAL, CKMB, CKMBINDEX, TROPONINI in the last 168 hours.  HbA1C: Hgb A1c MFr Bld  Date/Time Value Ref Range Status  11/09/2019 03:59 PM 7.0 (H) 4.8 - 5.6 % Final    Comment:    (NOTE) Pre diabetes:          5.7%-6.4% Diabetes:              >6.4% Glycemic control for   <7.0% adults with diabetes   08/07/2019 05:27 AM 7.0 (H) 4.8 - 5.6 % Final    Comment:    (NOTE) Pre diabetes:          5.7%-6.4% Diabetes:              >6.4% Glycemic control for   <7.0% adults with diabetes     CBG: Recent Labs  Lab 11/15/19 1701 11/15/19 2035 11/16/19 0100 11/16/19 0357 11/16/19 0809  GLUCAP 192* 151* 106* 118* 102*    Review of Systems:   Gen: Denies fever, chills,  weight change, fatigue, night sweats HEENT: Denies blurred vision, double vision, hearing loss, tinnitus, sinus congestion, rhinorrhea, sore throat, neck stiffness, dysphagia PULM: per  HPI CV: Denies chest pain, edema, orthopnea, paroxysmal nocturnal dyspnea, palpitations GI: Denies abdominal pain, nausea, vomiting, diarrhea, hematochezia, melena, constipation, change in bowel habits GU: Denies dysuria, hematuria, polyuria, oliguria, urethral discharge Endocrine: Denies hot or cold intolerance, polyuria, polyphagia or appetite change Derm: Denies rash, dry skin, scaling or peeling skin change Heme: Denies easy bruising, bleeding, bleeding gums Neuro: Denies headache, numbness, weakness, slurred speech, loss of memory or consciousness   Past Medical History  He,  has a past medical history of Cancer ([place]), COPD (chronic obstructive pulmonary disease) ([place]), Diabetes ([employer]) (2018), HTN (hypertension), and Psoriatic arthritis ([place]) (2015).   Surgical History    Past Surgical History:  Procedure Laterality Date  . BACK SURGERY  1990  . BIOPSY  12/30/2018   Procedure: BIOPSY;  Surgeon: Danie Binder, MD;  [facility];  Service: Endoscopy;;  gastric  . CATARACT EXTRACTION Bilateral   . CERVICAL SPINE SURGERY  1995  . COLONOSCOPY WITH PROPOFOL N/A 12/30/2018   Procedure: COLONOSCOPY WITH PROPOFOL;  Surgeon: Danie Binder, MD;  Location: [facility];  Service: Endoscopy;  Laterality: N/A;  12:30pm  . ESOPHAGOGASTRODUODENOSCOPY (EGD) WITH PROPOFOL N/A 12/30/2018   Procedure: ESOPHAGOGASTRODUODENOSCOPY (EGD) WITH PROPOFOL;  Surgeon: Danie Binder, MD;  Location: [facility];  Service: Endoscopy;  Laterality: N/A;  . IR IMAGING GUIDED PORT INSERTION  09/04/2019  . POLYPECTOMY  12/30/2018   Procedure: POLYPECTOMY;  Surgeon: Danie Binder, MD;  Location: [facility];  Service: Endoscopy;;  colon  . THORACENTESIS Right 08/08/2019   Procedure: Thoracentesis;  Surgeon: [name], MD;  Location: [facility];  Service: Pulmonary;  Laterality: Right;  Marland Kitchen VIDEO BRONCHOSCOPY Right 08/08/2019   Procedure: Video Bronchoscopy with Erbe Cryo Biopsy of Right mainstem;  Surgeon: [name], MD;  Location: [facility] OR;  Service: Pulmonary;  Laterality: Right;     Social History   reports that he quit smoking about 27 years ago. His smoking use included cigarettes. He has a 25.00 pack-year smoking history. He has never used smokeless tobacco. He reports current alcohol use. He reports that he does not use drugs.   Family History   His family history includes Alcoholism in his father; CAD in his brother; Diabetes in his brother. There is no history of Colon cancer, Colon polyps, or Gastric cancer.   Allergies Allergies  Allergen Reactions  . Chlorhexidine   . Aleve [Naproxen Sodium] Hives and Rash     Home Medications  Prior to Admission medications   Medication Sig Start Date End Date Taking? Authorizing Provider  acetaminophen (TYLENOL) 325 MG tablet Take 2 tablets (650 mg total) by mouth every 6 (six) hours as needed for mild pain, fever or headache. 10/28/19  Yes Emokpae, Courage, MD  albuterol (VENTOLIN HFA) 108 (90 Base) MCG/ACT inhaler Inhale 2 puffs into the lungs every 6 (six) hours as needed for wheezing or shortness of breath.   Yes [provider]  diltiazem (CARDIZEM CD) 360 MG 24 hr capsule Take 1 capsule (360 mg total) by mouth daily. 10/29/19  Yes Emokpae, Courage, MD  doxycycline (MONODOX) 50 MG capsule Take 50 mg by mouth 2 (two) times daily as needed (rosacea flare ups.).   Yes [provider]  ferrous sulfate 325 (65 FE) MG tablet Take 325 mg by mouth daily with supper.   Yes [provider]  FLOVENT HFA 110 MCG/ACT inhaler Inhale 1 puff into the lungs 2 (two) times daily.  06/20/19  Yes [provider]  folic acid (FOLVITE) 1 MG tablet Take 1 mg by mouth daily.   Yes [provider]  gabapentin (NEURONTIN) 300 MG capsule  Take 600 mg by mouth at bedtime.   Yes [provider]  lidocaine-prilocaine (EMLA) cream Apply to the Port-A-Cath site 30 minutes before treatment 08/26/19  Yes Curt Bears, MD  loratadine (CLARITIN) 10 MG tablet Take 10 mg by mouth daily.   Yes [provider]  metFORMIN (GLUCOPHAGE-XR) 500 MG 24 hr tablet Take 500-1,000 mg by mouth See admin instructions. Take 2 tablets (1000 mg) by mouth in the morning & 1 tablet (500 mg) by mouth at night. 10/15/18  Yes [provider]  metoprolol tartrate (LOPRESSOR) 25 MG tablet Take 1 tablet (25 mg total) by mouth 2 (two) times daily. 10/28/19  Yes Emokpae, Courage, MD  metroNIDAZOLE (METROCREAM) 0.75 % cream Apply 1 application topically 2 (two) times daily as needed (facial rosacea).    Yes [provider]  mometasone (ELOCON) 0.1 % cream Apply 1 application topically 2 (two) times daily as needed (psoriasis).    Yes [provider]  Omega-3 Fatty Acids (FISH OIL) 1000 MG CAPS Take 1,000 mg by mouth daily.    Yes [provider]  pantoprazole (PROTONIX) 40 MG tablet 1 po 30 mins prior to first meal 12/30/18  Yes Fields, Marga Melnick, MD  prochlorperazine (COMPAZINE) 10 MG tablet Take 1 tablet (10 mg total) by mouth every 6 (six) hours as needed for nausea or vomiting. 08/26/19  Yes Curt Bears, MD  senna-docusate (SENOKOT-S) 8.6-50 MG tablet Take 2 tablets by mouth at bedtime. 10/28/19 10/27/20 Yes Emokpae, Courage, MD  traMADol (ULTRAM) 50 MG tablet Take 50 mg by mouth every 4 (four) hours as needed for moderate pain. for pain 08/19/18  Yes [provider]  triamcinolone lotion (KENALOG) 0.1 % Apply 1 application topically 4 (four) times daily. 09/15/19  Yes Tanner, Lyndon Code., PA-C  umeclidinium-vilanterol (ANORO ELLIPTA) 62.5-25 MCG/INH AEPB Inhale 1 puff into the lungs daily. 08/13/19  Yes Mariel Aloe, MD     Critical care time: n/a   Roselie Awkward, MD [place] [facility] Pager: [phone] Cell:  [phone] If no response, call [phone]

## 2019-11-16 NOTE — Progress Notes (Signed)
Patient with history of non small cell lung cancer (right) admitted to [facility] and subjective fevers found to have a right sided loculated pleural effusion with concern for empyema.  IR placed a right sided chest tube on 6.7.21. Per Epic output has been:  215 ml, 187 ml, 200 ml, 250 ml, 1300 ml Per RN patient has not output yet today. Note from CT surgery Dr. Roxan Hockey dated 6.12.21 recommends removal.  Chest xray from 6.13.21 reads loculated pleural effusion on the right with areas of atelectasis and consolidation in the right mid and lower lung zones are stable.   Team is requesting evaluation for possible chest tube removal. After review of the case with IR Attending Dr. Annamaria Boots recommend that chest tube placed to water seal overnight with new xray ordered for 6.14.21 for  further evaluation prior to removal.

## 2019-11-16 NOTE — Plan of Care (Signed)
Plan of care 

## 2019-11-16 NOTE — Progress Notes (Signed)
PROGRESS NOTE    CLAUD GOWAN    Code Status: DNR  MGQ:[id number] DOB: 09-29-1945 DOA: 11/08/2019 LOS: 8 days  PCP: [name], MD CC:  Chief Complaint  Patient presents with  . Fever  . Cough  . [facility] Summary   This is a 74 year old male with history of stage IV lung cancer s/p palliative radiotherapy and now on chemotherapy, COPD with chronic hypoxic respiratory failure, paroxysmal atrial fibrillation not on anticoagulation, chronic anemia requiring intermittent transfusions and recent admission with sepsis secondary to pneumonia and treated with antibiotics who presented on 6/5 with fevers, right-sided chest pain and cough.  In the ED was tachycardic and CXR revealed stable loculated right pleural effusion with compressive atelectasis.  Outpatient CT of the chest was concerning for increased right pleural effusion with question of possible empyema.  He was started on broad-spectrum antibiotics and IR was consulted for possible ultrasound-guided thoracentesis unfortunately could not sit up for the procedure.  Cardiothoracic surgery evaluated the patient and recommended pigtail chest tube placement which was placed by IR on 6/7.  6/12: PCCM consulted as patient continued to have drainage   A & P   Principal Problem:   Loculated pleural effusion Active Problems:   COPD (chronic obstructive pulmonary disease) (HCC)   Type 2 diabetes mellitus (HCC)   HTN (hypertension)   Anemia in neoplastic disease   AF (paroxysmal atrial fibrillation) (HCC)   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HCC)   Acquired thrombophilia ([place])   Recurrent right pleural effusion   Advanced care planning/counseling discussion   Palliative care by specialist   1. Right pleural effusion concerning for parapneumonic vs empyema (most likely) vs. Malignant effusion a. Afebrile and hemodynamically stable on 2 L nasal cannula b. Currently with right-sided pigtail chest tube  placement by IR on 6/7  c. Unfortunately, no cultures or labs obtained from the chest tube/pleural fluid to help direct therapy. d. CT CS and PCCM recommending removal of chest tube with observation, IR recommending placement to waterseal overnight with new x-ray in a.m. further eval prior to removal e. Day 9/14 antibiotics, currently on Levaquin p.o.  2. Stage IV lung cancer s/p palliative radiotherapy a. Follows with [name] as an outpatient b. Palliative and oncology have both been consulted  3. Chronic hypoxic respiratory failure/COPD, stable a. At baseline 2 L/min nasal cannula  4. Anemia of chronic disease, stable  5. Paroxysmal A. fib a. Not on anticoagulation due to transfusion dependent anemia b. Continue diltiazem and metoprolol  6. Hypokalemia replete  7. Hypomagnesemia resolved  8. Type 2 diabetes with hyperglycemia a. HA1C 7 in March 2021 b. Continue sliding scale  9. Generalized deconditioning a. HH PT at discharge  DVT prophylaxis: SCDs Start: 11/09/19 0608  Family Communication: Patient's wife at bedside has been updated today Disposition Plan: will likely observe additional 24 hours after this to removal prior to discharge Status is: Inpatient  Remains inpatient appropriate because:Inpatient level of care appropriate due to severity of illness   Dispo: The patient is from: Home              Anticipated d/c is to: Home              Anticipated d/c date is: 2 days              Patient currently is not medically stable to d/c.  Pressure injury documentation    None  Consultants  IR.   Prior hospitalist spoke to Dr. Ezra Sites surgery on phone on 11/08/2019.   Palliative care Oncology [facility] PCCM  Procedures  Right pigtail chest tube placement on 11/10/2019 by IR  Antibiotics   Anti-infectives (From admission, onward)   Start     Dose/Rate Route Frequency Ordered Stop   11/15/19 1100  levofloxacin (LEVAQUIN) tablet 750 mg      Discontinue     750 mg Oral Daily 11/15/19 1039 11/18/19 0959   11/09/19 1200  vancomycin (VANCOREADY) IVPB 1250 mg/250 mL  Status:  Discontinued        1,250 mg 166.7 mL/hr over 90 Minutes Intravenous Every 12 hours 11/09/19 0619 11/12/19 1252   11/09/19 0615  ceFEPIme (MAXIPIME) 2 g in sodium chloride 0.9 % 100 mL IVPB  Status:  Discontinued        2 g 200 mL/hr over 30 Minutes Intravenous Every 8 hours 11/09/19 0614 11/15/19 1039   11/08/19 2300  metroNIDAZOLE (FLAGYL) IVPB 500 mg  Status:  Discontinued        500 mg 100 mL/hr over 60 Minutes Intravenous Every 8 hours 11/08/19 2255 11/15/19 1039   11/08/19 2300  vancomycin (VANCOREADY) IVPB 1750 mg/350 mL        1,750 mg 175 mL/hr over 120 Minutes Intravenous  Once 11/08/19 2257 11/09/19 0123   11/08/19 2045  vancomycin (VANCOCIN) IVPB 1000 mg/200 mL premix  Status:  Discontinued        1,000 mg 200 mL/hr over 60 Minutes Intravenous  Once 11/08/19 2043 11/08/19 2256   11/08/19 2045  ceFEPIme (MAXIPIME) 2 g in sodium chloride 0.9 % 100 mL IVPB        2 g 200 mL/hr over 30 Minutes Intravenous  Once 11/08/19 2043 11/08/19 2322        Subjective   Patient and wife at bedside.  Patient sleeping comfortably.  Denies any complaints at this time and no overnight events.  Objective   Vitals:   11/16/19 0355 11/16/19 0740 11/16/19 0952 11/16/19 1350  BP: 118/80  113/62 108/69  Pulse: 90  100 92  Resp: 19   18  Temp: (!) 97.5 F (36.4 C)   (!) 97.5 F (36.4 C)  TempSrc: Oral   Oral  SpO2: 92% (!) 89% 92% 99%  Weight: 74.3 kg     Height:        Intake/Output Summary (Last 24 hours) at 11/16/2019 1438 Last data filed at 11/16/2019 1300 Gross per 24 hour  Intake 733 ml  Output 1140 ml  Net -407 ml   Filed Weights   11/09/19 0500 11/16/19 0355  Weight: 81.6 kg 74.3 kg    Examination:  Physical Exam Vitals and nursing note reviewed. Exam conducted with a chaperone present.  Constitutional:      Appearance: Normal  appearance.  HENT:     Head: Normocephalic and atraumatic.  Eyes:     Conjunctiva/sclera: Conjunctivae normal.  Cardiovascular:     Rate and Rhythm: Normal rate and regular rhythm.  Pulmonary:     Effort: Pulmonary effort is normal.     Breath sounds: Rales present.  Abdominal:     General: Abdomen is flat.     Palpations: Abdomen is soft.  Musculoskeletal:        General: No swelling or tenderness.  Skin:    Coloration: Skin is not jaundiced or pale.  Neurological:     Mental Status: He  is alert. Mental status is at baseline.  Psychiatric:        Mood and Affect: Mood normal.        Behavior: Behavior normal.     Data Reviewed: I have personally reviewed following labs and imaging studies  CBC: Recent Labs  Lab 11/11/19 0453 11/12/19 0336 11/13/19 0450 11/14/19 0423 11/16/19 0307  WBC 7.7 6.7 6.2 6.4 8.8  HGB 8.9* 8.4* 8.5* 8.6* 8.5*  HCT 29.6* 28.0* 28.3* 28.0* 28.1*  MCV 92.5 91.5 91.6 91.5 89.5  PLT 290 257 354 239 921   Basic Metabolic Panel: Recent Labs  Lab 11/11/19 0453 11/11/19 0453 11/12/19 0336 11/13/19 0450 11/14/19 0423 11/15/19 0500 11/16/19 0307  NA 136  --  135 135 135  --  137  K 3.5  --  3.6 3.2* 3.5  --  3.3*  CL 101  --  98 99 97*  --  95*  CO2 23  --  25 26 29   --  32  GLUCOSE 80  --  99 114* 123*  --  125*  BUN 11  --  12 12 9   --  14  CREATININE 0.54*  --  0.61 0.54* 0.58*  --  0.68  CALCIUM 7.8*  --  7.6* 7.9* 7.9*  --  7.9*  MG 1.5*   < > 1.7 1.5* 1.9 1.5* 2.0   < > = values in this interval not displayed.   GFR: Estimated Creatinine Clearance: 83.8 mL/min (by C-G formula based on SCr of 0.68 mg/dL). Liver Function Tests: Recent Labs  Lab 11/09/19 1559  AST 16  ALT 20  ALKPHOS 59  BILITOT 0.7  PROT 7.5  ALBUMIN 1.9*   No results for input(s): LIPASE, AMYLASE in the last 168 hours. No results for input(s): AMMONIA in the last 168 hours. Coagulation Profile: No results for input(s): INR, PROTIME in the last 168  hours. Cardiac Enzymes: No results for input(s): CKTOTAL, CKMB, CKMBINDEX, TROPONINI in the last 168 hours. BNP (last 3 results) No results for input(s): PROBNP in the last 8760 hours. HbA1C: No results for input(s): HGBA1C in the last 72 hours. CBG: Recent Labs  Lab 11/15/19 2035 11/16/19 0100 11/16/19 0357 11/16/19 0809 11/16/19 1233  GLUCAP 151* 106* 118* 102* 230*   Lipid Profile: No results for input(s): CHOL, HDL, LDLCALC, TRIG, CHOLHDL, LDLDIRECT in the last 72 hours. Thyroid Function Tests: No results for input(s): TSH, T4TOTAL, FREET4, T3FREE, THYROIDAB in the last 72 hours. Anemia Panel: No results for input(s): VITAMINB12, FOLATE, FERRITIN, TIBC, IRON, RETICCTPCT in the last 72 hours. Sepsis Labs: No results for input(s): PROCALCITON, LATICACIDVEN in the last 168 hours.  Recent Results (from the past 240 hour(s))  SARS CORONAVIRUS 2 (TAT 6-24 HRS) Nasopharyngeal Nasopharyngeal Swab     Status: None   Collection Time: 11/08/19  1:32 PM   Specimen: Nasopharyngeal Swab  Result Value Ref Range Status   SARS Coronavirus 2 NEGATIVE NEGATIVE Final    Comment: (NOTE) SARS-CoV-2 target nucleic acids are NOT DETECTED. The SARS-CoV-2 RNA is generally detectable in upper and lower respiratory specimens during the acute phase of infection. Negative results do not preclude SARS-CoV-2 infection, do not rule out co-infections with other pathogens, and should not be used as the sole basis for treatment or other patient management decisions. Negative results must be combined with clinical observations, patient history, and epidemiological information. The expected result is Negative. Fact Sheet for Patients: SugarRoll.be Fact Sheet for Healthcare Providers: https://www.woods-mathews.com/ This test is  not yet approved or cleared by the [place] and  has been authorized for detection and/or diagnosis of SARS-CoV-2 by FDA under an  Emergency Use Authorization (EUA). This EUA will remain  in effect (meaning this test can be used) for the duration of the COVID-19 declaration under Section 56 4(b)(1) of the Act, 21 U.S.C. section 360bbb-3(b)(1), unless the authorization is terminated or revoked sooner. Performed at [facility], [place] [address] [place], [place] [address]   Culture, blood (Routine x 2)     Status: None   Collection Time: 11/08/19  8:23 PM   Specimen: BLOOD  Result Value Ref Range Status   Specimen Description   Final    BLOOD RIGHT ANTECUBITAL Performed at [facility] [address], [place], [place] [address]    Special Requests   Final    BOTTLES DRAWN AEROBIC AND ANAEROBIC Blood Culture adequate volume Performed at [facility] [address], [place], [place] [address]    Culture   Final    NO GROWTH 5 DAYS Performed at [facility], [place] [address], [place], [name] [address]    Report Status 11/14/2019 FINAL  Final  Urine Culture     Status: Abnormal   Collection Time: 11/08/19  8:24 PM   Specimen: Urine, Random  Result Value Ref Range Status   Specimen Description   Final    URINE, RANDOM Performed at [facility] [address] [place], [place] [address]    Special Requests   Final    NONE Performed at [facility], [place] [address] [place], [place] [address]    Culture 30,000 COLONIES/mL KLEBSIELLA PNEUMONIAE (A)  Final   Report Status 11/11/2019 FINAL  Final   Organism ID, Bacteria KLEBSIELLA PNEUMONIAE (A)  Final      Susceptibility   Klebsiella pneumoniae - MIC*    AMPICILLIN >=32 RESISTANT Resistant     CEFAZOLIN <=4 SENSITIVE Sensitive     CEFTRIAXONE <=1 SENSITIVE Sensitive     CIPROFLOXACIN <=0.25 SENSITIVE Sensitive     GENTAMICIN <=1 SENSITIVE Sensitive     IMIPENEM <=0.25 SENSITIVE Sensitive     NITROFURANTOIN 64 INTERMEDIATE Intermediate     TRIMETH/SULFA <=20 SENSITIVE  Sensitive     AMPICILLIN/SULBACTAM 8 SENSITIVE Sensitive     PIP/TAZO <=4 SENSITIVE Sensitive     * 30,000 COLONIES/mL KLEBSIELLA PNEUMONIAE  Culture, blood (Routine x 2)     Status: None   Collection Time: 11/08/19  8:28 PM   Specimen: BLOOD  Result Value Ref Range Status   Specimen Description   Final    BLOOD LEFT ANTECUBITAL Performed at [facility] [address], [place], [place] [address]    Special Requests   Final    BOTTLES DRAWN AEROBIC AND ANAEROBIC Blood Culture adequate volume Performed at [facility] [address], [place], [place] [address]    Culture   Final    NO GROWTH 5 DAYS Performed at [facility], [place] [address] [place], [place] [address]    Report Status 11/14/2019 FINAL  Final  MRSA PCR Screening     Status: None   Collection Time: 11/08/19 10:50 PM   Specimen: Nasal Mucosa; Nasopharyngeal  Result Value Ref Range Status   MRSA by PCR NEGATIVE NEGATIVE Final    Comment:        The GeneXpert MRSA Assay (FDA approved for NASAL specimens only), is  one component of a comprehensive MRSA colonization surveillance program. It is not intended to diagnose MRSA infection nor to guide or monitor treatment for MRSA infections. Performed at [facility], [place] [address], [place], [place] [address]          Radiology Studies: DG CHEST PORT 1 VIEW  Result Date: 11/16/2019 CLINICAL DATA:  Lung carcinoma with cough and pneumonia EXAM: PORTABLE CHEST 1 VIEW COMPARISON:  November 15, 2019 FINDINGS: Port-A-Cath tip is in the superior vena cava. Pigtail catheter remains on the right. Small stable lateral right pneumothorax. There is persistent loculated pleural effusion on the right with areas of consolidation atelectasis throughout the right mid and lower lung zone regions. There is atelectatic change in the left base with equivocal left pleural effusion. Left lung otherwise clear. Heart size is  normal. Pulmonary vascular is normal. No adenopathy. No bone lesions. IMPRESSION: Tube and catheter positions as described. Small lateral right pneumothorax is stable. Loculated pleural effusion on the right with areas of atelectasis and consolidation in the right mid and lower lung zones are stable. Mild left base atelectasis with equivocal left pleural effusion. Stable cardiac silhouette. Appearance similar to 1 day prior. Electronically Signed   By: Lowella Grip III M.D.   On: 11/16/2019 11:48   DG CHEST PORT 1 VIEW  Result Date: 11/15/2019 CLINICAL DATA:  Pleural effusion EXAM: PORTABLE CHEST 1 VIEW COMPARISON:  11/14/2019 FINDINGS: Pigtail catheter is again identified on the right stable in appearance. Small residual pneumothorax is again seen. Underlying parenchymal opacity is again seen and stable. No significant increase in the degree of pleural fluid is noted. Right chest wall port is seen. Cardiac shadow is stable. Left lung remains clear. IMPRESSION: Stable chest stable appearance of the chest when compared with the previous day. Electronically Signed   By: Inez Catalina M.D.   On: 11/15/2019 15:09   DG CHEST PORT 1 VIEW  Result Date: 11/14/2019 CLINICAL DATA:  Chest tube in place. EXAM: PORTABLE CHEST 1 VIEW COMPARISON:  Radiograph yesterday.  CT 11/07/2019 FINDINGS: Right chest port remains in place. Right pigtail catheter in place. Partially loculated right pleural effusion appears similar to yesterday, possible small partially loculated lateral pneumothorax. Persistent opacities throughout the right mid lower lung zone. Rightward mediastinal shift is unchanged. Left lung is clear. IMPRESSION: Right pigtail catheter remains in place with small volume of residual partially loculated pleural fluid. Possible small lateral loculated pneumothorax versus loculated fluid, recommend continued radiographic follow-up. Exam is otherwise unchanged from yesterday. Electronically Signed   By: Keith Rake M.D.   On: 11/14/2019 16:45        Scheduled Meds: . albuterol  2.5 mg Nebulization TID  . budesonide (PULMICORT) nebulizer solution  0.25 mg Nebulization BID  . diltiazem  360 mg Oral Daily  . feeding supplement (ENSURE ENLIVE)  237 mL Oral BID BM  . folic acid  1 mg Oral Daily  . gabapentin  600 mg Oral QHS  . insulin aspart  0-9 Units Subcutaneous Q4H  . levofloxacin  750 mg Oral Daily  . metoprolol tartrate  25 mg Oral BID  . pantoprazole  40 mg Oral BID AC  . polyethylene glycol  17 g Oral Daily  . senna-docusate  2 tablet Oral QHS  . sodium chloride flush  10-40 mL Intracatheter Q12H  . sodium chloride flush  3 mL Intravenous Q12H  . tamsulosin  0.4 mg Oral Daily  . triamcinolone cream   Topical Daily  .  umeclidinium-vilanterol  1 puff Inhalation Daily   Continuous Infusions:    Time spent: 25 minutes with over 50% of the time coordinating the patient's care    Harold Hedge, DO [facility] Pager [phone]  Call night coverage person covering after 7pm

## 2019-11-17 ENCOUNTER — Inpatient Hospital Stay (HOSPITAL_COMMUNITY): Payer: Medicare Other

## 2019-11-17 ENCOUNTER — Inpatient Hospital Stay: Payer: Medicare Other

## 2019-11-17 LAB — CBC
HCT: 27.5 % — ABNORMAL LOW (ref 39.0–52.0)
Hemoglobin: 8.3 g/dL — ABNORMAL LOW (ref 13.0–17.0)
MCH: 27.9 pg (ref 26.0–34.0)
MCHC: 30.2 g/dL (ref 30.0–36.0)
MCV: 92.3 fL (ref 80.0–100.0)
Platelets: 253 10*3/uL (ref 150–400)
RBC: 2.98 MIL/uL — ABNORMAL LOW (ref 4.22–5.81)
RDW: 17.8 % — ABNORMAL HIGH (ref 11.5–15.5)
WBC: 8.6 10*3/uL (ref 4.0–10.5)
nRBC: 0 % (ref 0.0–0.2)

## 2019-11-17 LAB — GLUCOSE, CAPILLARY
Glucose-Capillary: 98 mg/dL (ref 70–99)
Glucose-Capillary: 227 mg/dL — ABNORMAL HIGH (ref 70–99)
Glucose-Capillary: 226 mg/dL — ABNORMAL HIGH (ref 70–99)
Glucose-Capillary: 153 mg/dL — ABNORMAL HIGH (ref 70–99)

## 2019-11-17 LAB — MAGNESIUM: Magnesium: 1.8 mg/dL (ref 1.7–2.4)

## 2019-11-17 NOTE — Progress Notes (Signed)
Patient chest tube removed by [facility]ology today at bedside.

## 2019-11-17 NOTE — Progress Notes (Signed)
Patient dressing reassessed from chest tube removal site. Gauze and tape dressing is dry clean and intact. Patient denies any pain or discomfort at site.

## 2019-11-17 NOTE — Progress Notes (Signed)
PROGRESS NOTE    Jeremy [name]    Code Status: DNR  LFY:[id number] DOB: 1946/02/20 DOA: 11/08/2019 LOS: 9 days  PCP: [name], MD CC:  Chief Complaint  Patient presents with  . Fever  . Cough  . [facility] Summary   This is a 74 year old male with history of stage IV lung cancer s/p palliative radiotherapy and now on chemotherapy, COPD with chronic hypoxic respiratory failure, paroxysmal atrial fibrillation not on anticoagulation, chronic anemia requiring intermittent transfusions and recent admission with sepsis secondary to pneumonia and treated with antibiotics who presented on 6/5 with fevers, right-sided chest pain and cough.  In the ED was tachycardic and CXR revealed stable loculated right pleural effusion with compressive atelectasis.  Outpatient CT of the chest was concerning for increased right pleural effusion with question of possible empyema.  He was started on broad-spectrum antibiotics and IR was consulted for possible ultrasound-guided thoracentesis unfortunately could not sit up for the procedure.  Cardiothoracic surgery evaluated the patient and recommended pigtail chest tube placement which was placed by IR on 6/7.  6/12: PCCM consulted as patient continued to have drainage.  Antibiotics changed to Levaquin p.o. 6/14: Chest tube removed by IR    A & P   Principal Problem:   Loculated pleural effusion Active Problems:   COPD (chronic obstructive pulmonary disease) (HCC)   Type 2 diabetes mellitus (HCC)   HTN (hypertension)   Anemia in neoplastic disease   AF (paroxysmal atrial fibrillation) (HCC)   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HCC)   Acquired thrombophilia ([place])   Recurrent right pleural effusion   Advanced care planning/counseling discussion   Palliative care by specialist   1. Right pleural effusion concerning for parapneumonic vs empyema (most likely) vs. Malignant effusion a. Afebrile and hemodynamically stable on  2 L nasal cannula b. S tube 6/7-> 6/14 c. Unfortunately, no cultures or labs obtained from the chest tube/pleural fluid to help direct therapy however most likely empyema per PCCM d. Day 10/14 antibiotics, currently on Levaquin p.o.  2. Stage IV lung cancer s/p palliative radiotherapy a. Follows with [name] as an outpatient b. Palliative and oncology have both been consulted  3. Chronic hypoxic respiratory failure/COPD, stable a. At baseline 2 L/min nasal cannula  4. Anemia of chronic disease, stable  5. Paroxysmal A. fib a. Not on anticoagulation due to transfusion dependent anemia b. Continue diltiazem and metoprolol  6. Hypokalemia replete  7. Hypomagnesemia resolved  8. Type 2 diabetes with hyperglycemia a. HA1C 7 in March 2021 b. Continue sliding scale  9. Generalized deconditioning a. HH PT at discharge  DVT prophylaxis: SCDs Start: 11/09/19 0608  Family Communication: Patient's wife at bedside has been updated today Disposition Plan: Observe overnight post chest tube removal and if stable he can discharge tomorrow on antibiotics Status is: Inpatient  Remains inpatient appropriate because:Inpatient level of care appropriate due to severity of illness   Dispo: The patient is from: Home              Anticipated d/c is to: Home              Anticipated d/c date is: 1 day              Patient currently is not medically stable to d/c.          Pressure injury documentation    None  Consultants  IR.   Prior  hospitalist spoke to Dr. Ezra Sites surgery on phone on 11/08/2019.   Palliative care Oncology [facility] PCCM  Procedures  Right pigtail chest tube placement on 11/10/2019 by IR  Antibiotics   Anti-infectives (From admission, onward)   Start     Dose/Rate Route Frequency Ordered Stop   11/15/19 1100  levofloxacin (LEVAQUIN) tablet 750 mg        750 mg Oral Daily 11/15/19 1039 11/17/19 1012   11/09/19 1200  vancomycin (VANCOREADY) IVPB 1250  mg/250 mL  Status:  Discontinued        1,250 mg 166.7 mL/hr over 90 Minutes Intravenous Every 12 hours 11/09/19 0619 11/12/19 1252   11/09/19 0615  ceFEPIme (MAXIPIME) 2 g in sodium chloride 0.9 % 100 mL IVPB  Status:  Discontinued        2 g 200 mL/hr over 30 Minutes Intravenous Every 8 hours 11/09/19 0614 11/15/19 1039   11/08/19 2300  metroNIDAZOLE (FLAGYL) IVPB 500 mg  Status:  Discontinued        500 mg 100 mL/hr over 60 Minutes Intravenous Every 8 hours 11/08/19 2255 11/15/19 1039   11/08/19 2300  vancomycin (VANCOREADY) IVPB 1750 mg/350 mL        1,750 mg 175 mL/hr over 120 Minutes Intravenous  Once 11/08/19 2257 11/09/19 0123   11/08/19 2045  vancomycin (VANCOCIN) IVPB 1000 mg/200 mL premix  Status:  Discontinued        1,000 mg 200 mL/hr over 60 Minutes Intravenous  Once 11/08/19 2043 11/08/19 2256   11/08/19 2045  ceFEPIme (MAXIPIME) 2 g in sodium chloride 0.9 % 100 mL IVPB        2 g 200 mL/hr over 30 Minutes Intravenous  Once 11/08/19 2043 11/08/19 2322        Subjective   Patient seen and examined at bedside no acute distress and resting comfortably.  No events overnight.  Tolerating diet.  Denies any chest pain, shortness of breath, fever, nausea, vomiting, urinary complaints.  Admits to having bowel movement.  Otherwise ROS negative   Objective   Vitals:   11/17/19 0605 11/17/19 0853 11/17/19 1327 11/17/19 1446  BP: 115/80  105/62   Pulse: 90  89   Resp: 18  18   Temp: (!) 97.4 F (36.3 C)  (!) 97.5 F (36.4 C)   TempSrc: Oral     SpO2: 99% 96% 93% 95%  Weight: 75.4 kg     Height:        Intake/Output Summary (Last 24 hours) at 11/17/2019 1718 Last data filed at 11/17/2019 1000 Gross per 24 hour  Intake 240 ml  Output 710 ml  Net -470 ml   Filed Weights   11/09/19 0500 11/16/19 0355 11/17/19 0605  Weight: 81.6 kg 74.3 kg 75.4 kg    Examination:  Physical Exam Vitals and nursing note reviewed.  Constitutional:      Appearance: Normal  appearance.  HENT:     Head: Normocephalic and atraumatic.  Eyes:     Conjunctiva/sclera: Conjunctivae normal.  Cardiovascular:     Rate and Rhythm: Normal rate and regular rhythm.  Pulmonary:     Effort: Pulmonary effort is normal.     Breath sounds: Normal breath sounds.  Abdominal:     General: Abdomen is flat.     Palpations: Abdomen is soft.  Musculoskeletal:        General: No swelling or tenderness.  Skin:    Coloration: Skin is not jaundiced or pale.  Neurological:  Mental Status: He is alert. Mental status is at baseline.  Psychiatric:        Mood and Affect: Mood normal.        Behavior: Behavior normal.     Data Reviewed: I have personally reviewed following labs and imaging studies  CBC: Recent Labs  Lab 11/12/19 0336 11/13/19 0450 11/14/19 0423 11/16/19 0307 11/17/19 0311  WBC 6.7 6.2 6.4 8.8 8.6  HGB 8.4* 8.5* 8.6* 8.5* 8.3*  HCT 28.0* 28.3* 28.0* 28.1* 27.5*  MCV 91.5 91.6 91.5 89.5 92.3  PLT 257 354 239 267 295   Basic Metabolic Panel: Recent Labs  Lab 11/11/19 0453 11/11/19 0453 11/12/19 0336 11/12/19 0336 11/13/19 0450 11/14/19 0423 11/15/19 0500 11/16/19 0307 11/17/19 0311  NA 136  --  135  --  135 135  --  137  --   K 3.5  --  3.6  --  3.2* 3.5  --  3.3*  --   CL 101  --  98  --  99 97*  --  95*  --   CO2 23  --  25  --  26 29  --  32  --   GLUCOSE 80  --  99  --  114* 123*  --  125*  --   BUN 11  --  12  --  12 9  --  14  --   CREATININE 0.54*  --  0.61  --  0.54* 0.58*  --  0.68  --   CALCIUM 7.8*  --  7.6*  --  7.9* 7.9*  --  7.9*  --   MG 1.5*   < > 1.7   < > 1.5* 1.9 1.5* 2.0 1.8   < > = values in this interval not displayed.   GFR: Estimated Creatinine Clearance: 85.1 mL/min (by C-G formula based on SCr of 0.68 mg/dL). Liver Function Tests: No results for input(s): AST, ALT, ALKPHOS, BILITOT, PROT, ALBUMIN in the last 168 hours. No results for input(s): LIPASE, AMYLASE in the last 168 hours. No results for input(s):  AMMONIA in the last 168 hours. Coagulation Profile: No results for input(s): INR, PROTIME in the last 168 hours. Cardiac Enzymes: No results for input(s): CKTOTAL, CKMB, CKMBINDEX, TROPONINI in the last 168 hours. BNP (last 3 results) No results for input(s): PROBNP in the last 8760 hours. HbA1C: No results for input(s): HGBA1C in the last 72 hours. CBG: Recent Labs  Lab 11/16/19 1535 11/16/19 2126 11/17/19 0830 11/17/19 1156 11/17/19 1712  GLUCAP 211* 179* 98 227* 226*   Lipid Profile: No results for input(s): CHOL, HDL, LDLCALC, TRIG, CHOLHDL, LDLDIRECT in the last 72 hours. Thyroid Function Tests: No results for input(s): TSH, T4TOTAL, FREET4, T3FREE, THYROIDAB in the last 72 hours. Anemia Panel: No results for input(s): VITAMINB12, FOLATE, FERRITIN, TIBC, IRON, RETICCTPCT in the last 72 hours. Sepsis Labs: No results for input(s): PROCALCITON, LATICACIDVEN in the last 168 hours.  Recent Results (from the past 240 hour(s))  SARS CORONAVIRUS 2 (TAT 6-24 HRS) Nasopharyngeal Nasopharyngeal Swab     Status: None   Collection Time: 11/08/19  1:32 PM   Specimen: Nasopharyngeal Swab  Result Value Ref Range Status   SARS Coronavirus 2 NEGATIVE NEGATIVE Final    Comment: (NOTE) SARS-CoV-2 target nucleic acids are NOT DETECTED. The SARS-CoV-2 RNA is generally detectable in upper and lower respiratory specimens during the acute phase of infection. Negative results do not preclude SARS-CoV-2 infection, do not rule out co-infections with other  pathogens, and should not be used as the sole basis for treatment or other patient management decisions. Negative results must be combined with clinical observations, patient history, and epidemiological information. The expected result is Negative. Fact Sheet for Patients: SugarRoll.be Fact Sheet for Healthcare Providers: https://www.woods-mathews.com/ This test is not yet approved or cleared by the  [place] FDA and  has been authorized for detection and/or diagnosis of SARS-CoV-2 by FDA under an Emergency Use Authorization (EUA). This EUA will remain  in effect (meaning this test can be used) for the duration of the COVID-19 declaration under Section 56 4(b)(1) of the Act, 21 U.S.C. section 360bbb-3(b)(1), unless the authorization is terminated or revoked sooner. Performed at [facility], [place] [address], [place], [place] [address]   Culture, blood (Routine x 2)     Status: None   Collection Time: 11/08/19  8:23 PM   Specimen: BLOOD  Result Value Ref Range Status   Specimen Description   Final    BLOOD RIGHT ANTECUBITAL Performed at [facility] [address] [place], [place] [address]    Special Requests   Final    BOTTLES DRAWN AEROBIC AND ANAEROBIC Blood Culture adequate volume Performed at [facility] [address], [place], [place] [address]    Culture   Final    NO GROWTH 5 DAYS Performed at [facility], [place] [address] [place], [place] [address]    Report Status 11/14/2019 FINAL  Final  Urine Culture     Status: Abnormal   Collection Time: 11/08/19  8:24 PM   Specimen: Urine, Random  Result Value Ref Range Status   Specimen Description   Final    URINE, RANDOM Performed at [facility] [address], [place], [place] [address]    Special Requests   Final    NONE Performed at [facility], [place] [address], [place], [place] [address]    Culture 30,000 COLONIES/mL KLEBSIELLA PNEUMONIAE (A)  Final   Report Status 11/11/2019 FINAL  Final   Organism ID, Bacteria KLEBSIELLA PNEUMONIAE (A)  Final      Susceptibility   Klebsiella pneumoniae - MIC*    AMPICILLIN >=32 RESISTANT Resistant     CEFAZOLIN <=4 SENSITIVE Sensitive     CEFTRIAXONE <=1 SENSITIVE Sensitive     CIPROFLOXACIN <=0.25 SENSITIVE Sensitive     GENTAMICIN <=1 SENSITIVE Sensitive     IMIPENEM <=0.25  SENSITIVE Sensitive     NITROFURANTOIN 64 INTERMEDIATE Intermediate     TRIMETH/SULFA <=20 SENSITIVE Sensitive     AMPICILLIN/SULBACTAM 8 SENSITIVE Sensitive     PIP/TAZO <=4 SENSITIVE Sensitive     * 30,000 COLONIES/mL KLEBSIELLA PNEUMONIAE  Culture, blood (Routine x 2)     Status: None   Collection Time: 11/08/19  8:28 PM   Specimen: BLOOD  Result Value Ref Range Status   Specimen Description   Final    BLOOD LEFT ANTECUBITAL Performed at [facility] [address], [place], [place] [address]    Special Requests   Final    BOTTLES DRAWN AEROBIC AND ANAEROBIC Blood Culture adequate volume Performed at [facility] [address], [place], [place] [address]    Culture   Final    NO GROWTH 5 DAYS Performed at [facility], [place] [address], [place], [place] [address]    Report Status 11/14/2019 FINAL  Final  MRSA PCR Screening     Status: None  Collection Time: 11/08/19 10:50 PM   Specimen: Nasal Mucosa; Nasopharyngeal  Result Value Ref Range Status   MRSA by PCR NEGATIVE NEGATIVE Final    Comment:        The GeneXpert MRSA Assay (FDA approved for NASAL specimens only), is one component of a comprehensive MRSA colonization surveillance program. It is not intended to diagnose MRSA infection nor to guide or monitor treatment for MRSA infections. Performed at [facility], [place] [address], [place], [place] [address]          Radiology Studies: [facility] Chest Port 1 View  Result Date: 11/17/2019 CLINICAL DATA:  Status post chest tube removal.  Pleural effusion EXAM: PORTABLE CHEST 1 VIEW COMPARISON:  November 17, 2019 FINDINGS: Chest tube has been removed on the right. Port-A-Cath tip is in the superior vena cava. No pneumothorax. There is loculated pleural effusion on the right with atelectasis in portions of the right mid and lower lung regions. Left lung is clear. Heart size and pulmonary vascularity are normal. No  adenopathy. There is aortic atherosclerosis. No bone lesions. IMPRESSION: Removal of chest tube on the right without pneumothorax. Stable Port-A-Cath position. Loculated pleural effusion on the right with atelectatic change in the right mid lower lung zones. Left lung clear. No new opacity evident. Stable cardiac silhouette. Aortic Atherosclerosis (ICD10-I70.0). Electronically Signed   By: Lowella Grip III M.D.   On: 11/17/2019 10:16   DG Chest Port 1 View  Result Date: 11/17/2019 CLINICAL DATA:  Lung carcinoma and right empyema. EXAM: PORTABLE CHEST 1 VIEW COMPARISON:  11/16/2019 FINDINGS: Stable cardiac enlargement. Pigtail catheter remains in the right pleural space in stable position. Stable small amount of residual air in the lateral right pleural space with an area some residual pleural thickening/fluid. Improved aeration at both lung bases. No pulmonary edema. Stable positioning of Port-A-Cath. IMPRESSION: Stable small amount of residual air in the lateral right pleural space with an area residual pleural thickening/fluid. Electronically Signed   By: Aletta Edouard M.D.   On: 11/17/2019 07:46   DG CHEST PORT 1 VIEW  Result Date: 11/16/2019 CLINICAL DATA:  Lung carcinoma with cough and pneumonia EXAM: PORTABLE CHEST 1 VIEW COMPARISON:  November 15, 2019 FINDINGS: Port-A-Cath tip is in the superior vena cava. Pigtail catheter remains on the right. Small stable lateral right pneumothorax. There is persistent loculated pleural effusion on the right with areas of consolidation atelectasis throughout the right mid and lower lung zone regions. There is atelectatic change in the left base with equivocal left pleural effusion. Left lung otherwise clear. Heart size is normal. Pulmonary vascular is normal. No adenopathy. No bone lesions. IMPRESSION: Tube and catheter positions as described. Small lateral right pneumothorax is stable. Loculated pleural effusion on the right with areas of atelectasis and  consolidation in the right mid and lower lung zones are stable. Mild left base atelectasis with equivocal left pleural effusion. Stable cardiac silhouette. Appearance similar to 1 day prior. Electronically Signed   By: Lowella Grip III M.D.   On: 11/16/2019 11:48        Scheduled Meds: . albuterol  2.5 mg Nebulization TID  . budesonide (PULMICORT) nebulizer solution  0.25 mg Nebulization BID  . diltiazem  360 mg Oral Daily  . feeding supplement (ENSURE ENLIVE)  237 mL Oral BID BM  . folic acid  1 mg Oral Daily  . gabapentin  600 mg Oral QHS  . insulin aspart  0-9 Units Subcutaneous TID WC & HS  .  metoprolol tartrate  25 mg Oral BID  . pantoprazole  40 mg Oral BID AC  . polyethylene glycol  17 g Oral Daily  . senna-docusate  2 tablet Oral QHS  . sodium chloride flush  10-40 mL Intracatheter Q12H  . sodium chloride flush  3 mL Intravenous Q12H  . tamsulosin  0.4 mg Oral Daily  . triamcinolone cream   Topical Daily  . umeclidinium-vilanterol  1 puff Inhalation Daily   Continuous Infusions:    Time spent: 20 minutes with over 50% of the time coordinating the patient's care    Harold Hedge, DO [facility] Pager [phone]  Call night coverage person covering after 7pm

## 2019-11-17 NOTE — Progress Notes (Signed)
Referring Physician(s): [place]   Supervising Physician: Jacqulynn Cadet  Patient Status:  [facility] - In-pt  Chief Complaint:  Right loculated pleural effusion  Subjective: Pt doing ok this am; denies worsening CP,dyspnea or cough; anxious to go home   Allergies: Chlorhexidine and Aleve [naproxen sodium]  Medications: Prior to Admission medications   Medication Sig Start Date End Date Taking? Authorizing Provider  acetaminophen (TYLENOL) 325 MG tablet Take 2 tablets (650 mg total) by mouth every 6 (six) hours as needed for mild pain, fever or headache. 10/28/19  Yes Emokpae, Courage, MD  albuterol (VENTOLIN HFA) 108 (90 Base) MCG/ACT inhaler Inhale 2 puffs into the lungs every 6 (six) hours as needed for wheezing or shortness of breath.   Yes [provider]  diltiazem (CARDIZEM CD) 360 MG 24 hr capsule Take 1 capsule (360 mg total) by mouth daily. 10/29/19  Yes Emokpae, Courage, MD  doxycycline (MONODOX) 50 MG capsule Take 50 mg by mouth 2 (two) times daily as needed (rosacea flare ups.).   Yes [provider]  ferrous sulfate 325 (65 FE) MG tablet Take 325 mg by mouth daily with supper.   Yes [provider]  FLOVENT HFA 110 MCG/ACT inhaler Inhale 1 puff into the lungs 2 (two) times daily.  06/20/19  Yes [provider]  folic acid (FOLVITE) 1 MG tablet Take 1 mg by mouth daily.   Yes [provider]  gabapentin (NEURONTIN) 300 MG capsule Take 600 mg by mouth at bedtime.   Yes [provider]  lidocaine-prilocaine (EMLA) cream Apply to the Port-A-Cath site 30 minutes before treatment 08/26/19  Yes Curt Bears, MD  loratadine (CLARITIN) 10 MG tablet Take 10 mg by mouth daily.   Yes [provider]  metFORMIN (GLUCOPHAGE-XR) 500 MG 24 hr tablet Take 500-1,000 mg by mouth See admin instructions. Take 2 tablets (1000 mg) by mouth in the morning & 1 tablet (500 mg) by mouth at night. 10/15/18  Yes [provider]    metoprolol tartrate (LOPRESSOR) 25 MG tablet Take 1 tablet (25 mg total) by mouth 2 (two) times daily. 10/28/19  Yes Emokpae, Courage, MD  metroNIDAZOLE (METROCREAM) 0.75 % cream Apply 1 application topically 2 (two) times daily as needed (facial rosacea).    Yes [provider]  mometasone (ELOCON) 0.1 % cream Apply 1 application topically 2 (two) times daily as needed (psoriasis).    Yes [provider]  Omega-3 Fatty Acids (FISH OIL) 1000 MG CAPS Take 1,000 mg by mouth daily.    Yes [provider]  pantoprazole (PROTONIX) 40 MG tablet 1 po 30 mins prior to first meal 12/30/18  Yes Fields, Marga Melnick, MD  prochlorperazine (COMPAZINE) 10 MG tablet Take 1 tablet (10 mg total) by mouth every 6 (six) hours as needed for nausea or vomiting. 08/26/19  Yes Curt Bears, MD  senna-docusate (SENOKOT-S) 8.6-50 MG tablet Take 2 tablets by mouth at bedtime. 10/28/19 10/27/20 Yes Emokpae, Courage, MD  traMADol (ULTRAM) 50 MG tablet Take 50 mg by mouth every 4 (four) hours as needed for moderate pain. for pain 08/19/18  Yes [provider]  triamcinolone lotion (KENALOG) 0.1 % Apply 1 application topically 4 (four) times daily. 09/15/19  Yes Tanner, Lyndon Code., PA-C  umeclidinium-vilanterol (ANORO ELLIPTA) 62.5-25 MCG/INH AEPB Inhale 1 puff into the lungs daily. 08/13/19  Yes Mariel Aloe, MD     Vital Signs: BP 115/80 (BP Location: Right Arm)   Pulse 90   Temp Marland Kitchen)  97.4 F (36.3 C) (Oral)   Resp 18   Ht 6\' 2"  (1.88 m)   Wt 166 lb 3.6 oz (75.4 kg)   SpO2 96%   BMI 21.34 kg/m   Physical Exam: awake/alert: rt upper ant chest drain intact, insertion site ok, OP 60 cc yellow fluid  Imaging: DG Chest 2 View  Result Date: 11/13/2019 CLINICAL DATA:  Chest tube placement EXAM: CHEST - 2 VIEW COMPARISON:  11/08/2019 FINDINGS: Interval placement of right-sided pigtail pleural drainage catheter with significant decrease in the size of the loculated right-sided pleural effusion.  Stable positioning right-sided chest port. Heart size is stable. Hazy reticulonodular opacity within the right lung base with improved aeration from prior. Left lung is clear. No pneumothorax. IMPRESSION: 1. Interval placement of right-sided pigtail pleural drainage catheter with significant decrease in size of the loculated right-sided pleural effusion. 2. Hazy reticulonodular opacity within the right lung base with improved aeration from prior. Electronically Signed   By: Davina Poke D.O.   On: 11/13/2019 14:51   DG Chest Port 1 View  Result Date: 11/17/2019 CLINICAL DATA:  Lung carcinoma and right empyema. EXAM: PORTABLE CHEST 1 VIEW COMPARISON:  11/16/2019 FINDINGS: Stable cardiac enlargement. Pigtail catheter remains in the right pleural space in stable position. Stable small amount of residual air in the lateral right pleural space with an area some residual pleural thickening/fluid. Improved aeration at both lung bases. No pulmonary edema. Stable positioning of Port-A-Cath. IMPRESSION: Stable small amount of residual air in the lateral right pleural space with an area residual pleural thickening/fluid. Electronically Signed   By: Aletta Edouard M.D.   On: 11/17/2019 07:46   DG CHEST PORT 1 VIEW  Result Date: 11/16/2019 CLINICAL DATA:  Lung carcinoma with cough and pneumonia EXAM: PORTABLE CHEST 1 VIEW COMPARISON:  November 15, 2019 FINDINGS: Port-A-Cath tip is in the superior vena cava. Pigtail catheter remains on the right. Small stable lateral right pneumothorax. There is persistent loculated pleural effusion on the right with areas of consolidation atelectasis throughout the right mid and lower lung zone regions. There is atelectatic change in the left base with equivocal left pleural effusion. Left lung otherwise clear. Heart size is normal. Pulmonary vascular is normal. No adenopathy. No bone lesions. IMPRESSION: Tube and catheter positions as described. Small lateral right pneumothorax is  stable. Loculated pleural effusion on the right with areas of atelectasis and consolidation in the right mid and lower lung zones are stable. Mild left base atelectasis with equivocal left pleural effusion. Stable cardiac silhouette. Appearance similar to 1 day prior. Electronically Signed   By: Lowella Grip III M.D.   On: 11/16/2019 11:48   DG CHEST PORT 1 VIEW  Result Date: 11/15/2019 CLINICAL DATA:  Pleural effusion EXAM: PORTABLE CHEST 1 VIEW COMPARISON:  11/14/2019 FINDINGS: Pigtail catheter is again identified on the right stable in appearance. Small residual pneumothorax is again seen. Underlying parenchymal opacity is again seen and stable. No significant increase in the degree of pleural fluid is noted. Right chest wall port is seen. Cardiac shadow is stable. Left lung remains clear. IMPRESSION: Stable chest stable appearance of the chest when compared with the previous day. Electronically Signed   By: Inez Catalina M.D.   On: 11/15/2019 15:09   DG CHEST PORT 1 VIEW  Result Date: 11/14/2019 CLINICAL DATA:  Chest tube in place. EXAM: PORTABLE CHEST 1 VIEW COMPARISON:  Radiograph yesterday.  CT 11/07/2019 FINDINGS: Right chest port remains in place. Right pigtail catheter in place.  Partially loculated right pleural effusion appears similar to yesterday, possible small partially loculated lateral pneumothorax. Persistent opacities throughout the right mid lower lung zone. Rightward mediastinal shift is unchanged. Left lung is clear. IMPRESSION: Right pigtail catheter remains in place with small volume of residual partially loculated pleural fluid. Possible small lateral loculated pneumothorax versus loculated fluid, recommend continued radiographic follow-up. Exam is otherwise unchanged from yesterday. Electronically Signed   By: Keith Rake M.D.   On: 11/14/2019 16:45    Labs:  CBC: Recent Labs    11/13/19 0450 11/14/19 0423 11/16/19 0307 11/17/19 0311  WBC 6.2 6.4 8.8 8.6  HGB  8.5* 8.6* 8.5* 8.3*  HCT 28.3* 28.0* 28.1* 27.5*  PLT 354 239 267 253    COAGS: Recent Labs    08/07/19 1927 10/19/19 1720 10/19/19 1750 11/08/19 2023  INR 1.1 1.0  --  1.1  APTT 33  --  34  --     BMP: Recent Labs    11/12/19 0336 11/13/19 0450 11/14/19 0423 11/16/19 0307  NA 135 135 135 137  K 3.6 3.2* 3.5 3.3*  CL 98 99 97* 95*  CO2 25 26 29  32  GLUCOSE 99 114* 123* 125*  BUN 12 12 9 14   CALCIUM 7.6* 7.9* 7.9* 7.9*  CREATININE 0.61 0.54* 0.58* 0.68  GFRNONAA >60 >60 >60 >60  GFRAA >60 >60 >60 >60    LIVER FUNCTION TESTS: Recent Labs    10/21/19 0541 10/22/19 0434 10/26/19 0721 11/05/19 0942 11/08/19 2023 11/09/19 1559  BILITOT 0.8  --   --  0.4 0.6 0.7  AST 19  --   --  17 22 16   ALT 21  --   --  20 23 20   ALKPHOS 51  --   --  67 68 59  PROT 6.3*  --   --  8.1 8.4* 7.5  ALBUMIN 2.0*   < > 2.3* 1.7* 2.2* 1.9*   < > = values in this interval not displayed.    Assessment and Plan: 74 yo male with hx lung ca, loculated right pleural effusion, concerning for parapneumonic versus empyema, s/p right chest tube placement in IR 11/10/2019 by Dr. Laurence Ferrari: afebrile, WBC nl, hgb 8.3; CXR today- stable small amount of residual air in the lateral right pleural space with an area residual pleural thickening/fluid; imaging reviewed by Dr. Laurence Ferrari- decision made to remove rt chest tube; rt chest tube removed in its entirety without immediate complications, vaseline gauze dressing applied to site; f/u CXR pending.    Electronically Signed: D. Rowe Robert, PA-C 11/17/2019, 9:30 AM   I spent a total of 20 minutes at the the patient's bedside AND on the patient's hospital floor or unit, greater than 50% of which was counseling/coordinating care for right chest drain    Patient ID: Jeremy [name], male   DOB: 1946/05/07, 74 y.o.   MRN: [id number]

## 2019-11-17 NOTE — Plan of Care (Signed)
  Problem: Education: Goal: Knowledge of General Education information will improve Description Including pain rating scale, medication(s)/side effects and non-pharmacologic comfort measures Outcome: Progressing   

## 2019-11-17 NOTE — Progress Notes (Signed)
CXR reviewed and no significant reaccumulation of Rt pleural fluid.  Rt chest tube had total of 1,035 ml outpt since admission, but only 60 ml over past 24 hours.  Chest tube removed earlier this morning w/o difficulty.    Discussed with Mr. Standiford and his wife symptoms to monitor for that would be concerning for re-accumulation of pleural fluid.  PCCM will sign off.  Please call if additional help needed while he is in hospital.  Chesley Mires, MD [place] Pager - [phone] 11/17/2019, 9:39 AM

## 2019-11-17 NOTE — Progress Notes (Deleted)
Pt continuing to refuse telemetry monitoring.

## 2019-11-17 NOTE — Care Management Important Message (Signed)
Important Message  Patient Details IM Letter given to Roque Lias SW Case Manager to present to the Patient Name: Jeremy [name] MRN: [id number] Date of Birth: 05-13-1946   Medicare Important Message Given:  Yes     Kerin Salen 11/17/2019, 10:05 AM

## 2019-11-17 NOTE — Progress Notes (Signed)
Physical Therapy Treatment Patient Details Name: Jeremy [name] MRN: [id number] DOB: 01/02/1946 Today's Date: 11/17/2019    History of Present Illness 74 year old male with history of stage IV lung cancer status post palliative radiotherapy and now on chemotherapy, COPD with chronic hypoxic respiratory failure, paroxysmal A. fib not on anticoagulation, chronic anemia requiring intermittent transfusions, recent admission with sepsis secondary to pneumonia and treated with antibiotics presented on 11/08/2019 with fevers, right-sided chest pain and cough.  In the ED, he was tachycardic and chest x-ray revealed stable loculated right pleural effusion with compressive atelectasis.  Outpatient CT of the chest was concerning for increasing right pleural effusion with question of possible empyema.  Patient was started on broad-spectrum antibiotics and chest tube placed 11/10/19 removed 11/17/19    PT Comments    Progressing fairly well. Pt requires encouragement to put forth max effort and to try to do as much as he can for himself instead of immediately requesting for someone to "pull me up." O2 96% on RA, dyspnea  2/4 with ambulation. Wife present during session.     Follow Up Recommendations  Home health PT;Supervision for mobility/OOB     Equipment Recommendations  None recommended by PT    Recommendations for Other Services       Precautions / Restrictions Precautions Precautions: Fall Restrictions Weight Bearing Restrictions: No    Mobility  Bed Mobility Overal bed mobility: Needs Assistance Bed Mobility: Rolling;Sidelying to Sit Rolling: Supervision Sidelying to sit: Min assist   Sit to supine: Min guard   General bed mobility comments: Cues for technique and for pt to do as much as he can for himself instead of immediately saying "pull me up". Increased time.  Transfers Overall transfer level: Needs assistance Equipment used: Rolling walker (2 wheeled) Transfers: Sit to/from  Stand Sit to Stand: Min assist;From elevated surface         General transfer comment: Cues for hand placement, technique, and for pt to do as much as he can for himself instead of waiting to be pulled up.  Ambulation/Gait Ambulation/Gait assistance: Min assist Gait Distance (Feet): 75 Feet (x2)   Gait Pattern/deviations: Step-to pattern;Step-through pattern;Decreased stride length;Decreased step length - right;Shuffle;Decreased step length - left     General Gait Details: Cues for safety, technique, and to take longer steps bilaterally. Initially begain with quick shuffling steps. Seated rest break after ~75 feet. O2 96% on RA, dyspnea 2/4   Stairs             Wheelchair Mobility    Modified Rankin (Stroke Patients Only)       Balance Overall balance assessment: Needs assistance         Standing balance support: Bilateral upper extremity supported Standing balance-Leahy Scale: Poor                              Cognition Arousal/Alertness: Awake/alert Behavior During Therapy: WFL for tasks assessed/performed Overall Cognitive Status: Within Functional Limits for tasks assessed                                 General Comments: wife present      Exercises      General Comments        Pertinent Vitals/Pain Pain Assessment: No/denies pain    Home Living  Prior Function            PT Goals (current goals can now be found in the care plan section) Progress towards PT goals: Progressing toward goals    Frequency    Min 3X/week      PT Plan Current plan remains appropriate    Co-evaluation              AM-PAC PT "6 Clicks" Mobility   Outcome Measure  Help needed turning from your back to your side while in a flat bed without using bedrails?: A Little Help needed moving from lying on your back to sitting on the side of a flat bed without using bedrails?: A Little Help needed  moving to and from a bed to a chair (including a wheelchair)?: A Little Help needed standing up from a chair using your arms (e.g., wheelchair or bedside chair)?: A Little Help needed to walk in hospital room?: A Little Help needed climbing 3-5 steps with a railing? : A Lot 6 Click Score: 17    End of Session Equipment Utilized During Treatment: Gait belt Activity Tolerance: Patient tolerated treatment well Patient left: in bed;with call bell/phone within reach;with bed alarm set;with family/visitor present   PT Visit Diagnosis: Difficulty in walking, not elsewhere classified (R26.2);Muscle weakness (generalized) (M62.81);Unsteadiness on feet (R26.81)     Time: 1520-1536 PT Time Calculation (min) (ACUTE ONLY): 16 min  Charges:  $Gait Training: 8-22 mins                         Doreatha Massed, PT Acute Rehabilitation  Office: [phone] Pager: [phone]

## 2019-11-18 LAB — CBC
HCT: 27.4 % — ABNORMAL LOW (ref 39.0–52.0)
Hemoglobin: 8.1 g/dL — ABNORMAL LOW (ref 13.0–17.0)
MCH: 27.3 pg (ref 26.0–34.0)
MCHC: 29.6 g/dL — ABNORMAL LOW (ref 30.0–36.0)
MCV: 92.3 fL (ref 80.0–100.0)
Platelets: 244 10*3/uL (ref 150–400)
RBC: 2.97 MIL/uL — ABNORMAL LOW (ref 4.22–5.81)
RDW: 17.7 % — ABNORMAL HIGH (ref 11.5–15.5)
WBC: 7.9 10*3/uL (ref 4.0–10.5)
nRBC: 0 % (ref 0.0–0.2)

## 2019-11-18 LAB — GLUCOSE, CAPILLARY: Glucose-Capillary: 132 mg/dL — ABNORMAL HIGH (ref 70–99)

## 2019-11-18 LAB — BASIC METABOLIC PANEL
Anion gap: 9 (ref 5–15)
BUN: 19 mg/dL (ref 8–23)
CO2: 31 mmol/L (ref 22–32)
Calcium: 7.9 mg/dL — ABNORMAL LOW (ref 8.9–10.3)
Chloride: 96 mmol/L — ABNORMAL LOW (ref 98–111)
Creatinine, Ser: 0.76 mg/dL (ref 0.61–1.24)
GFR calc Af Amer: >60 mL/min (ref >60)
GFR calc non Af Amer: >60 mL/min (ref >60)
Glucose, Bld: 245 mg/dL — ABNORMAL HIGH (ref 70–99)
Potassium: 4.1 mmol/L (ref 3.5–5.1)
Sodium: 136 mmol/L (ref 135–145)

## 2019-11-18 LAB — MAGNESIUM: Magnesium: 1.8 mg/dL (ref 1.7–2.4)

## 2019-11-18 MED ORDER — AMOXICILLIN-POT CLAVULANATE 875-125 MG PO TABS
1.0000 | ORAL_TABLET | Freq: Two times a day (BID) | ORAL | Status: DC
  Administered 2019-11-18: 1 via ORAL
  Filled 2019-11-18: qty 1

## 2019-11-18 MED ORDER — AMOXICILLIN-POT CLAVULANATE 875-125 MG PO TABS: 1.0000 | ORAL_TABLET | Freq: Two times a day (BID) | ORAL | 0 refills | Status: AC

## 2019-11-18 MED ORDER — HEPARIN SOD (PORK) LOCK FLUSH 100 UNIT/ML IV SOLN
500.0000 [IU] | INTRAVENOUS | Status: AC | PRN
  Administered 2019-11-18: 500 [IU]
  Filled 2019-11-18: qty 5

## 2019-11-18 NOTE — Discharge Summary (Addendum)
Physician Discharge Summary  Jeremy [name] TMA:[id number] DOB: 1946-02-11   PCP: [name], MD  Admit date: 11/08/2019 Discharge date: 11/18/2019 Length of Stay: 10 days   Code Status: DNR  Admitted From:  Home Discharged to:   [place]: PT/RN  Equipment/Devices:  None Discharge Condition:  Stable  Recommendations for Outpatient Follow-up   1. Follow up with Oncology in 1 week 2. Follow-up CBC 3. Recurrent shortness of breath, obtain chest x-ray  Hospital Summary  This is a 74 year old male with history of stage IV lung cancer s/p palliative radiotherapy and now on chemotherapy, COPD with chronic hypoxic respiratory failure, paroxysmal atrial fibrillation not on anticoagulation, chronic anemia requiring intermittent transfusions and recent admission with sepsis secondary to pneumonia and treated with antibiotics who presented on 6/5 with fevers, right-sided chest pain and cough.  In the ED was tachycardic and CXR revealed stable loculated right pleural effusion with compressive atelectasis.  CT of the chest was concerning for increased right pleural effusion with question of possible empyema.  He was started on broad-spectrum antibiotics and IR was consulted for possible ultrasound-guided thoracentesis unfortunately could not sit up for the procedure.  Cardiothoracic surgery evaluated the patient and recommended pigtail chest tube placement which was placed by IR on 6/7.  Unfortunately, labs were not obtained from the pleural effusion and so etiology was undetermined but suspected to be an empyema was treated with IV antibiotics.    6/12: PCCM consulted as patient continued to have drainage.  Antibiotics changed to Levaquin p.o. 6/14: Chest tube removed by IR 6/15: Discharged in stable condition on Augmentin twice daily for total 14 days of antibiotics  A & P   Principal Problem:   Loculated pleural effusion Active Problems:   COPD (chronic obstructive pulmonary disease)  (HCC)   Type 2 diabetes mellitus (HCC)   HTN (hypertension)   Anemia in neoplastic disease   AF (paroxysmal atrial fibrillation) (HCC)   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HCC)   Acquired thrombophilia ([place])   Recurrent right pleural effusion   Advanced care planning/counseling discussion   Palliative care by specialist    1. Right pleural effusion concerning, likely empyema but undiagnosed a. Afebrile and hemodynamically stable on room air b. Clinically undetermined if pneumonia as well c. Chest tube 6/7-> 6/14 d. Unfortunately, no cultures or labs obtained from the chest tube/pleural fluid to help direct therapy however most likely empyema per PCCM e. Day 10/14 antibiotics f. Discharged on Augmentin for total 14 days antibiotics  2. Stage IV lung cancer s/p palliative radiotherapy a. Follows with [name] as an outpatient  3. Chronic hypoxic respiratory failure/COPD, stable a. At baseline 2 L/min nasal cannula  4. Anemia of chronic disease, stable  5. Paroxysmal A. fib a. Not on anticoagulation due to transfusion dependent anemia b. Continue diltiazem and metoprolol  6. Hypokalemia replete  7. Hypomagnesemia resolved  8. Type 2 diabetes with hyperglycemia a. HA1C 7 in March 2021  9. Generalized deconditioning a. [place] PT/RN at discharge     Consultants  IR.  Prior hospitalist spoke to Dr. Ezra Sites surgery on phone on 11/08/2019.  Palliative care Oncology [facility] PCCM  Procedures  Right pigtail chest tube placement on 11/10/2019 by IR   Antibiotics   Anti-infectives (From admission, onward)   Start     Dose/Rate Route Frequency Ordered Stop   11/18/19 1000  amoxicillin-clavulanate (AUGMENTIN) 875-125 MG per tablet 1 tablet     Discontinue  1 tablet Oral Every 12 hours 11/18/19 0913     11/18/19 0000  amoxicillin-clavulanate (AUGMENTIN) 875-125 MG tablet     Discontinue     1 tablet Oral 2 times daily 11/18/19 0918 11/23/19  2359   11/15/19 1100  levofloxacin (LEVAQUIN) tablet 750 mg        750 mg Oral Daily 11/15/19 1039 11/17/19 1012   11/09/19 1200  vancomycin (VANCOREADY) IVPB 1250 mg/250 mL  Status:  Discontinued        1,250 mg 166.7 mL/hr over 90 Minutes Intravenous Every 12 hours 11/09/19 0619 11/12/19 1252   11/09/19 0615  ceFEPIme (MAXIPIME) 2 g in sodium chloride 0.9 % 100 mL IVPB  Status:  Discontinued        2 g 200 mL/hr over 30 Minutes Intravenous Every 8 hours 11/09/19 0614 11/15/19 1039   11/08/19 2300  metroNIDAZOLE (FLAGYL) IVPB 500 mg  Status:  Discontinued        500 mg 100 mL/hr over 60 Minutes Intravenous Every 8 hours 11/08/19 2255 11/15/19 1039   11/08/19 2300  vancomycin (VANCOREADY) IVPB 1750 mg/350 mL        1,750 mg 175 mL/hr over 120 Minutes Intravenous  Once 11/08/19 2257 11/09/19 0123   11/08/19 2045  vancomycin (VANCOCIN) IVPB 1000 mg/200 mL premix  Status:  Discontinued        1,000 mg 200 mL/hr over 60 Minutes Intravenous  Once 11/08/19 2043 11/08/19 2256   11/08/19 2045  ceFEPIme (MAXIPIME) 2 g in sodium chloride 0.9 % 100 mL IVPB        2 g 200 mL/hr over 30 Minutes Intravenous  Once 11/08/19 2043 11/08/19 2322       Subjective  Patient seen and examined at bedside no acute distress and resting comfortably.  No events overnight.  Tolerating diet. In good spirits and anticipating discharge.  Wife at bedside  Denies any chest pain, shortness of breath, fever, nausea, vomiting, urinary or bowel complaints. Otherwise ROS negative    Objective   Discharge Exam: Vitals:   11/18/19 0455 11/18/19 0725  BP: (!) 100/53   Pulse: 83   Resp: 19   Temp: 97.6 F (36.4 C)   SpO2: 94% 98%   Vitals:   11/17/19 2053 11/18/19 0455 11/18/19 0500 11/18/19 0725  BP: 114/73 (!) 100/53    Pulse: 95 83    Resp: 18 19    Temp: 97.6 F (36.4 C) 97.6 F (36.4 C)    TempSrc: Oral     SpO2: 96% 94%  98%  Weight:   75.2 kg   Height:        Physical Exam Vitals and nursing  note reviewed.  Constitutional:      Appearance: Normal appearance.  HENT:     Head: Normocephalic and atraumatic.  Eyes:     Conjunctiva/sclera: Conjunctivae normal.  Cardiovascular:     Rate and Rhythm: Normal rate and regular rhythm.  Pulmonary:     Effort: Pulmonary effort is normal.     Breath sounds: Normal breath sounds.  Abdominal:     General: Abdomen is flat.     Palpations: Abdomen is soft.  Musculoskeletal:        General: No swelling or tenderness.  Skin:    Coloration: Skin is not jaundiced or pale.  Neurological:     Mental Status: He is alert. Mental status is at baseline.  Psychiatric:        Mood and Affect: Mood normal.  Behavior: Behavior normal.       The results of significant diagnostics from this hospitalization (including imaging, microbiology, ancillary and laboratory) are listed below for reference.     Microbiology: Recent Results (from the past 240 hour(s))  SARS CORONAVIRUS 2 (TAT 6-24 HRS) Nasopharyngeal Nasopharyngeal Swab     Status: None   Collection Time: 11/08/19  1:32 PM   Specimen: Nasopharyngeal Swab  Result Value Ref Range Status   SARS Coronavirus 2 NEGATIVE NEGATIVE Final    Comment: (NOTE) SARS-CoV-2 target nucleic acids are NOT DETECTED. The SARS-CoV-2 RNA is generally detectable in upper and lower respiratory specimens during the acute phase of infection. Negative results do not preclude SARS-CoV-2 infection, do not rule out co-infections with other pathogens, and should not be used as the sole basis for treatment or other patient management decisions. Negative results must be combined with clinical observations, patient history, and epidemiological information. The expected result is Negative. Fact Sheet for Patients: SugarRoll.be Fact Sheet for Healthcare Providers: https://www.woods-mathews.com/ This test is not yet approved or cleared by the [place] FDA and  has  been authorized for detection and/or diagnosis of SARS-CoV-2 by FDA under an Emergency Use Authorization (EUA). This EUA will remain  in effect (meaning this test can be used) for the duration of the COVID-19 declaration under Section 56 4(b)(1) of the Act, 21 U.S.C. section 360bbb-3(b)(1), unless the authorization is terminated or revoked sooner. Performed at [facility], [place] [address] [place], [place] [address]   Culture, blood (Routine x 2)     Status: None   Collection Time: 11/08/19  8:23 PM   Specimen: BLOOD  Result Value Ref Range Status   Specimen Description   Final    BLOOD RIGHT ANTECUBITAL Performed at [facility] [address] [place], [place] [address]    Special Requests   Final    BOTTLES DRAWN AEROBIC AND ANAEROBIC Blood Culture adequate volume Performed at [facility] [address], [place], [place] [address]    Culture   Final    NO GROWTH 5 DAYS Performed at [facility], [place] [address] [place], [place] [address]    Report Status 11/14/2019 FINAL  Final  Urine Culture     Status: Abnormal   Collection Time: 11/08/19  8:24 PM   Specimen: Urine, Random  Result Value Ref Range Status   Specimen Description   Final    URINE, RANDOM Performed at [facility] [address], [place], [place] [address]    Special Requests   Final    NONE Performed at [facility], [place] [address] [place], [place] [address]    Culture 30,000 COLONIES/mL KLEBSIELLA PNEUMONIAE (A)  Final   Report Status 11/11/2019 FINAL  Final   Organism ID, Bacteria KLEBSIELLA PNEUMONIAE (A)  Final      Susceptibility   Klebsiella pneumoniae - MIC*    AMPICILLIN >=32 RESISTANT Resistant     CEFAZOLIN <=4 SENSITIVE Sensitive     CEFTRIAXONE <=1 SENSITIVE Sensitive     CIPROFLOXACIN <=0.25 SENSITIVE Sensitive     GENTAMICIN <=1 SENSITIVE Sensitive     IMIPENEM <=0.25 SENSITIVE Sensitive      NITROFURANTOIN 64 INTERMEDIATE Intermediate     TRIMETH/SULFA <=20 SENSITIVE Sensitive     AMPICILLIN/SULBACTAM 8 SENSITIVE Sensitive     PIP/TAZO <=4 SENSITIVE Sensitive     * 30,000 COLONIES/mL KLEBSIELLA PNEUMONIAE  Culture, blood (Routine x 2)  Status: None   Collection Time: 11/08/19  8:28 PM   Specimen: BLOOD  Result Value Ref Range Status   Specimen Description   Final    BLOOD LEFT ANTECUBITAL Performed at [facility] [address] [place], [place] [address]    Special Requests   Final    BOTTLES DRAWN AEROBIC AND ANAEROBIC Blood Culture adequate volume Performed at [facility] [address], [place], [place] [address]    Culture   Final    NO GROWTH 5 DAYS Performed at [facility], [place] [address] [place], [place] [address]    Report Status 11/14/2019 FINAL  Final  MRSA PCR Screening     Status: None   Collection Time: 11/08/19 10:50 PM   Specimen: Nasal Mucosa; Nasopharyngeal  Result Value Ref Range Status   MRSA by PCR NEGATIVE NEGATIVE Final    Comment:        The GeneXpert MRSA Assay (FDA approved for NASAL specimens only), is one component of a comprehensive MRSA colonization surveillance program. It is not intended to diagnose MRSA infection nor to guide or monitor treatment for MRSA infections. Performed at [facility], [place] [address] [place], [place] [address]      Labs: BNP (last 3 results) Recent Labs    08/06/19 1211  BNP 240.9*   Basic Metabolic Panel: Recent Labs  Lab 11/12/19 0336 11/12/19 0336 11/13/19 0450 11/13/19 0450 11/14/19 0423 11/15/19 0500 11/16/19 0307 11/17/19 0311 11/18/19 0319  NA 135  --  135  --  135  --  137  --  136  K 3.6  --  3.2*  --  3.5  --  3.3*  --  4.1  CL 98  --  99  --  97*  --  95*  --  96*  CO2 25  --  26  --  29  --  32  --  31  GLUCOSE 99  --  114*  --  123*  --  125*  --  245*  BUN 12  --  12  --  9  --  14  --  19   CREATININE 0.61  --  0.54*  --  0.58*  --  0.68  --  0.76  CALCIUM 7.6*  --  7.9*  --  7.9*  --  7.9*  --  7.9*  MG 1.7   < > 1.5*   < > 1.9 1.5* 2.0 1.8 1.8   < > = values in this interval not displayed.   Liver Function Tests: No results for input(s): AST, ALT, ALKPHOS, BILITOT, PROT, ALBUMIN in the last 168 hours. No results for input(s): LIPASE, AMYLASE in the last 168 hours. No results for input(s): AMMONIA in the last 168 hours. CBC: Recent Labs  Lab 11/13/19 0450 11/14/19 0423 11/16/19 0307 11/17/19 0311 11/18/19 0319  WBC 6.2 6.4 8.8 8.6 7.9  HGB 8.5* 8.6* 8.5* 8.3* 8.1*  HCT 28.3* 28.0* 28.1* 27.5* 27.4*  MCV 91.6 91.5 89.5 92.3 92.3  PLT 354 239 267 253 244   Cardiac Enzymes: No results for input(s): CKTOTAL, CKMB, CKMBINDEX, TROPONINI in the last 168 hours. BNP: Invalid input(s): POCBNP CBG: Recent Labs  Lab 11/17/19 0830 11/17/19 1156 11/17/19 1712 11/17/19 2118 11/18/19 0751  GLUCAP 98 227* 226* 153* 132*   D-Dimer No results for input(s): DDIMER in the last 72 hours. Hgb A1c No results for input(s): HGBA1C in the last 72 hours.  Lipid Profile No results for input(s): CHOL, HDL, LDLCALC, TRIG, CHOLHDL, LDLDIRECT in the last 72 hours. Thyroid function studies No results for input(s): TSH, T4TOTAL, T3FREE, THYROIDAB in the last 72 hours.  Invalid input(s): FREET3 Anemia work up No results for input(s): VITAMINB12, FOLATE, FERRITIN, TIBC, IRON, RETICCTPCT in the last 72 hours. Urinalysis    Component Value Date/Time   COLORURINE YELLOW 11/08/2019 2024   APPEARANCEUR CLOUDY (A) 11/08/2019 2024   LABSPEC 1.023 11/08/2019 2024   PHURINE 5.0 11/08/2019 2024   GLUCOSEU NEGATIVE 11/08/2019 2024   HGBUR MODERATE (A) 11/08/2019 2024   BILIRUBINUR NEGATIVE 11/08/2019 2024   KETONESUR NEGATIVE 11/08/2019 2024   PROTEINUR 100 (A) 11/08/2019 2024   NITRITE NEGATIVE 11/08/2019 2024   LEUKOCYTESUR NEGATIVE 11/08/2019 2024   Sepsis Labs Invalid input(s):  PROCALCITONIN,  WBC,  LACTICIDVEN Microbiology Recent Results (from the past 240 hour(s))  SARS CORONAVIRUS 2 (TAT 6-24 HRS) Nasopharyngeal Nasopharyngeal Swab     Status: None   Collection Time: 11/08/19  1:32 PM   Specimen: Nasopharyngeal Swab  Result Value Ref Range Status   SARS Coronavirus 2 NEGATIVE NEGATIVE Final    Comment: (NOTE) SARS-CoV-2 target nucleic acids are NOT DETECTED. The SARS-CoV-2 RNA is generally detectable in upper and lower respiratory specimens during the acute phase of infection. Negative results do not preclude SARS-CoV-2 infection, do not rule out co-infections with other pathogens, and should not be used as the sole basis for treatment or other patient management decisions. Negative results must be combined with clinical observations, patient history, and epidemiological information. The expected result is Negative. Fact Sheet for Patients: SugarRoll.be Fact Sheet for Healthcare Providers: https://www.woods-mathews.com/ This test is not yet approved or cleared by the [place] FDA and  has been authorized for detection and/or diagnosis of SARS-CoV-2 by FDA under an Emergency Use Authorization (EUA). This EUA will remain  in effect (meaning this test can be used) for the duration of the COVID-19 declaration under Section 56 4(b)(1) of the Act, 21 U.S.C. section 360bbb-3(b)(1), unless the authorization is terminated or revoked sooner. Performed at [facility], [place] [address] [place], [place] [address]   Culture, blood (Routine x 2)     Status: None   Collection Time: 11/08/19  8:23 PM   Specimen: BLOOD  Result Value Ref Range Status   Specimen Description   Final    BLOOD RIGHT ANTECUBITAL Performed at [facility] [address] [place], [place] [address]    Special Requests   Final    BOTTLES DRAWN AEROBIC AND ANAEROBIC Blood Culture adequate volume Performed at [facility] [address], [place], [place] [address]    Culture   Final    NO GROWTH 5 DAYS Performed at [facility], [place] [address], [place], [place] [address]    Report Status 11/14/2019 FINAL  Final  Urine Culture     Status: Abnormal   Collection Time: 11/08/19  8:24 PM   Specimen: Urine, Random  Result Value Ref Range Status   Specimen Description   Final    URINE, RANDOM Performed at [facility] [address], [place], [place] [address]    Special Requests   Final    NONE Performed at [facility], [place] [address] [place], [place] [address]    Culture 30,000 COLONIES/mL KLEBSIELLA PNEUMONIAE (A)  Final   Report Status 11/11/2019 FINAL  Final   Organism ID, Bacteria KLEBSIELLA PNEUMONIAE (A)  Final      Susceptibility   Klebsiella pneumoniae - MIC*    AMPICILLIN >=32 RESISTANT Resistant     CEFAZOLIN <=4 SENSITIVE Sensitive     CEFTRIAXONE <=1 SENSITIVE Sensitive     CIPROFLOXACIN <=0.25 SENSITIVE Sensitive     GENTAMICIN <=1 SENSITIVE Sensitive     IMIPENEM <=0.25 SENSITIVE Sensitive     NITROFURANTOIN 64 INTERMEDIATE Intermediate     TRIMETH/SULFA <=20 SENSITIVE Sensitive     AMPICILLIN/SULBACTAM 8 SENSITIVE Sensitive     PIP/TAZO <=4 SENSITIVE Sensitive     * 30,000 COLONIES/mL KLEBSIELLA PNEUMONIAE  Culture, blood (Routine x 2)     Status: None   Collection Time: 11/08/19  8:28 PM   Specimen: BLOOD  Result Value Ref Range Status   Specimen Description   Final    BLOOD LEFT ANTECUBITAL Performed at [facility] [address] [place], [place] [address]    Special Requests   Final    BOTTLES DRAWN AEROBIC AND ANAEROBIC Blood Culture adequate volume Performed at [facility] [address], [place], [place] [address]    Culture   Final    NO GROWTH 5 DAYS Performed at [facility], [place] [address] [place], [place] [address]    Report Status 11/14/2019  FINAL  Final  MRSA PCR Screening     Status: None   Collection Time: 11/08/19 10:50 PM   Specimen: Nasal Mucosa; Nasopharyngeal  Result Value Ref Range Status   MRSA by PCR NEGATIVE NEGATIVE Final    Comment:        The GeneXpert MRSA Assay (FDA approved for NASAL specimens only), is one component of a comprehensive MRSA colonization surveillance program. It is not intended to diagnose MRSA infection nor to guide or monitor treatment for MRSA infections. Performed at [facility], [place] [address], [place],  [address]     Discharge Instructions     Discharge Instructions    Diet - low sodium heart healthy   Complete by: As directed    Discharge instructions   Complete by: As directed    - take augmentin twice daily for the next 5 days with your first dose tonight. Do not skip doses. Monitor for persistent diarrhea while on this medication as this can be a sign of an abdominal infection and you should notify your PCP - get lab work next week and follow up with your oncologist  - if you have any significant change or worsening of your symptoms do not hesitate to contact your PCP or return to the ED   Increase activity slowly   Complete by: As directed      Allergies as of 11/18/2019      Reactions   Chlorhexidine    Aleve [naproxen Sodium] Hives, Rash      Medication List    TAKE these medications   acetaminophen 325 MG tablet Commonly known as: TYLENOL Take 2 tablets (650 mg total) by mouth every 6 (six) hours as needed for mild pain, fever or headache.   albuterol 108 (90 Base) MCG/ACT inhaler Commonly known as: VENTOLIN HFA Inhale 2 puffs into the lungs every 6 (six) hours as needed for wheezing or shortness of breath.   amoxicillin-clavulanate 875-125 MG tablet Commonly known as: Augmentin Take 1 tablet by mouth 2 (two) times daily for 5 days.   diltiazem 360 MG 24 hr capsule Commonly known as: CARDIZEM CD Take 1 capsule (360 mg  total) by  mouth daily.   doxycycline 50 MG capsule Commonly known as: MONODOX Take 50 mg by mouth 2 (two) times daily as needed (rosacea flare ups.).   ferrous sulfate 325 (65 FE) MG tablet Take 325 mg by mouth daily with supper.   Fish Oil 1000 MG Caps Take 1,000 mg by mouth daily.   Flovent HFA 110 MCG/ACT inhaler Generic drug: fluticasone Inhale 1 puff into the lungs 2 (two) times daily.   folic acid 1 MG tablet Commonly known as: FOLVITE Take 1 mg by mouth daily.   gabapentin 300 MG capsule Commonly known as: NEURONTIN Take 600 mg by mouth at bedtime.   lidocaine-prilocaine cream Commonly known as: EMLA Apply to the Port-A-Cath site 30 minutes before treatment   loratadine 10 MG tablet Commonly known as: CLARITIN Take 10 mg by mouth daily.   metFORMIN 500 MG 24 hr tablet Commonly known as: GLUCOPHAGE-XR Take 500-1,000 mg by mouth See admin instructions. Take 2 tablets (1000 mg) by mouth in the morning & 1 tablet (500 mg) by mouth at night.   metoprolol tartrate 25 MG tablet Commonly known as: LOPRESSOR Take 1 tablet (25 mg total) by mouth 2 (two) times daily.   metroNIDAZOLE 0.75 % cream Commonly known as: METROCREAM Apply 1 application topically 2 (two) times daily as needed (facial rosacea).   mometasone 0.1 % cream Commonly known as: ELOCON Apply 1 application topically 2 (two) times daily as needed (psoriasis).   pantoprazole 40 MG tablet Commonly known as: Protonix 1 po 30 mins prior to first meal   prochlorperazine 10 MG tablet Commonly known as: COMPAZINE Take 1 tablet (10 mg total) by mouth every 6 (six) hours as needed for nausea or vomiting.   senna-docusate 8.6-50 MG tablet Commonly known as: Senokot-S Take 2 tablets by mouth at bedtime.   traMADol 50 MG tablet Commonly known as: ULTRAM Take 50 mg by mouth every 4 (four) hours as needed for moderate pain. for pain   triamcinolone lotion 0.1 % Commonly known as: KENALOG Apply 1  application topically 4 (four) times daily.   umeclidinium-vilanterol 62.5-25 MCG/INH Aepb Commonly known as: ANORO ELLIPTA Inhale 1 puff into the lungs daily.       Allergies  Allergen Reactions  . Chlorhexidine   . Aleve [Naproxen Sodium] Hives and Rash    Time coordinating discharge: Over 30 minutes   SIGNED:   Harold Hedge, D.O. [facility] Pager: [phone]  11/18/2019, 11:37 AM

## 2019-11-18 NOTE — Progress Notes (Signed)
Pharmacist Chemotherapy Monitoring - Follow Up Assessment    I verify that I have reviewed each item in the below checklist:  . Regimen for the patient is scheduled for the appropriate day and plan matches scheduled date. Marland Kitchen Appropriate non-routine labs are ordered dependent on drug ordered. . If applicable, additional medications reviewed and ordered per protocol based on lifetime cumulative doses and/or treatment regimen.   Plan for follow-up and/or issues identified: Yes . I-vent associated with next due treatment: Yes . MD and/or nursing notified: No   Kennith Center, Pharm.D., CPP 11/18/2019@8 :58 AM

## 2019-11-18 NOTE — Progress Notes (Signed)
  Pt is being discharged home today. Discharge instructions including medications and follow up appointments given. Pt had no further questions at this time. 

## 2019-11-20 DIAGNOSIS — N390 Urinary tract infection, site not specified: Secondary | ICD-10-CM | POA: Diagnosis not present

## 2019-11-20 DIAGNOSIS — J9621 Acute and chronic respiratory failure with hypoxia: Secondary | ICD-10-CM | POA: Diagnosis not present

## 2019-11-20 DIAGNOSIS — C3491 Malignant neoplasm of unspecified part of right bronchus or lung: Secondary | ICD-10-CM | POA: Diagnosis not present

## 2019-11-20 DIAGNOSIS — J440 Chronic obstructive pulmonary disease with acute lower respiratory infection: Secondary | ICD-10-CM | POA: Diagnosis not present

## 2019-11-20 DIAGNOSIS — J189 Pneumonia, unspecified organism: Secondary | ICD-10-CM | POA: Diagnosis not present

## 2019-11-20 DIAGNOSIS — B952 Enterococcus as the cause of diseases classified elsewhere: Secondary | ICD-10-CM | POA: Diagnosis not present

## 2019-11-24 ENCOUNTER — Inpatient Hospital Stay (HOSPITAL_BASED_OUTPATIENT_CLINIC_OR_DEPARTMENT_OTHER): Payer: Medicare Other | Admitting: Internal Medicine

## 2019-11-24 ENCOUNTER — Other Ambulatory Visit: Payer: Self-pay

## 2019-11-24 ENCOUNTER — Inpatient Hospital Stay: Payer: Medicare Other

## 2019-11-24 ENCOUNTER — Encounter: Payer: Self-pay | Admitting: Nutrition

## 2019-11-24 ENCOUNTER — Inpatient Hospital Stay: Payer: Medicare Other | Admitting: Nutrition

## 2019-11-24 ENCOUNTER — Encounter: Payer: Self-pay | Admitting: Internal Medicine

## 2019-11-24 VITALS — BP 109/72 | HR 87 | Temp 97.9°F | Resp 17 | Ht 74.0 in | Wt 161.3 lb

## 2019-11-24 DIAGNOSIS — D6481 Anemia due to antineoplastic chemotherapy: Secondary | ICD-10-CM | POA: Diagnosis not present

## 2019-11-24 DIAGNOSIS — R5382 Chronic fatigue, unspecified: Secondary | ICD-10-CM

## 2019-11-24 DIAGNOSIS — Z5111 Encounter for antineoplastic chemotherapy: Secondary | ICD-10-CM

## 2019-11-24 DIAGNOSIS — Z87891 Personal history of nicotine dependence: Secondary | ICD-10-CM | POA: Diagnosis not present

## 2019-11-24 DIAGNOSIS — J910 Malignant pleural effusion: Secondary | ICD-10-CM | POA: Diagnosis not present

## 2019-11-24 DIAGNOSIS — R531 Weakness: Secondary | ICD-10-CM | POA: Diagnosis not present

## 2019-11-24 DIAGNOSIS — C3491 Malignant neoplasm of unspecified part of right bronchus or lung: Secondary | ICD-10-CM

## 2019-11-24 DIAGNOSIS — Z95828 Presence of other vascular implants and grafts: Secondary | ICD-10-CM | POA: Diagnosis not present

## 2019-11-24 DIAGNOSIS — C3431 Malignant neoplasm of lower lobe, right bronchus or lung: Secondary | ICD-10-CM | POA: Diagnosis present

## 2019-11-24 DIAGNOSIS — J90 Pleural effusion, not elsewhere classified: Secondary | ICD-10-CM

## 2019-11-24 DIAGNOSIS — Z452 Encounter for adjustment and management of vascular access device: Secondary | ICD-10-CM | POA: Diagnosis not present

## 2019-11-24 DIAGNOSIS — Z79899 Other long term (current) drug therapy: Secondary | ICD-10-CM | POA: Diagnosis not present

## 2019-11-24 DIAGNOSIS — Z9221 Personal history of antineoplastic chemotherapy: Secondary | ICD-10-CM | POA: Diagnosis not present

## 2019-11-24 DIAGNOSIS — E119 Type 2 diabetes mellitus without complications: Secondary | ICD-10-CM | POA: Diagnosis not present

## 2019-11-24 DIAGNOSIS — I10 Essential (primary) hypertension: Secondary | ICD-10-CM | POA: Diagnosis not present

## 2019-11-24 DIAGNOSIS — T451X5A Adverse effect of antineoplastic and immunosuppressive drugs, initial encounter: Secondary | ICD-10-CM | POA: Diagnosis not present

## 2019-11-24 DIAGNOSIS — Z923 Personal history of irradiation: Secondary | ICD-10-CM | POA: Diagnosis not present

## 2019-11-24 DIAGNOSIS — R21 Rash and other nonspecific skin eruption: Secondary | ICD-10-CM | POA: Diagnosis not present

## 2019-11-24 DIAGNOSIS — J449 Chronic obstructive pulmonary disease, unspecified: Secondary | ICD-10-CM | POA: Diagnosis not present

## 2019-11-24 DIAGNOSIS — Z792 Long term (current) use of antibiotics: Secondary | ICD-10-CM | POA: Diagnosis not present

## 2019-11-24 DIAGNOSIS — L4050 Arthropathic psoriasis, unspecified: Secondary | ICD-10-CM | POA: Diagnosis not present

## 2019-11-24 LAB — CMP (CANCER CENTER ONLY)
ALT: 13 U/L (ref 0–44)
AST: 11 U/L — ABNORMAL LOW (ref 15–41)
Albumin: 2 g/dL — ABNORMAL LOW (ref 3.5–5.0)
Alkaline Phosphatase: 72 U/L (ref 38–126)
Anion gap: 12 (ref 5–15)
BUN: 15 mg/dL (ref 8–23)
CO2: 28 mmol/L (ref 22–32)
Calcium: 9.3 mg/dL (ref 8.9–10.3)
Chloride: 100 mmol/L (ref 98–111)
Creatinine: 0.8 mg/dL (ref 0.61–1.24)
GFR, Est AFR Am: >60 mL/min (ref >60)
GFR, Estimated: >60 mL/min (ref >60)
Glucose, Bld: 229 mg/dL — ABNORMAL HIGH (ref 70–99)
Potassium: 4.2 mmol/L (ref 3.5–5.1)
Sodium: 140 mmol/L (ref 135–145)
Total Bilirubin: 0.3 mg/dL (ref 0.3–1.2)
Total Protein: 8.3 g/dL — ABNORMAL HIGH (ref 6.5–8.1)

## 2019-11-24 LAB — CBC WITH DIFFERENTIAL (CANCER CENTER ONLY)
Abs Immature Granulocytes: 0.13 10*3/uL — ABNORMAL HIGH (ref 0.00–0.07)
Basophils Absolute: 0 10*3/uL (ref 0.0–0.1)
Basophils Relative: 0 %
Eosinophils Absolute: 0.1 10*3/uL (ref 0.0–0.5)
Eosinophils Relative: 1 %
HCT: 28.4 % — ABNORMAL LOW (ref 39.0–52.0)
Hemoglobin: 8.5 g/dL — ABNORMAL LOW (ref 13.0–17.0)
Immature Granulocytes: 1 %
Lymphocytes Relative: 9 %
Lymphs Abs: 0.9 10*3/uL (ref 0.7–4.0)
MCH: 27.6 pg (ref 26.0–34.0)
MCHC: 29.9 g/dL — ABNORMAL LOW (ref 30.0–36.0)
MCV: 92.2 fL (ref 80.0–100.0)
Monocytes Absolute: 0.6 10*3/uL (ref 0.1–1.0)
Monocytes Relative: 6 %
Neutro Abs: 7.6 10*3/uL (ref 1.7–7.7)
Neutrophils Relative %: 83 %
Platelet Count: 248 10*3/uL (ref 150–400)
RBC: 3.08 MIL/uL — ABNORMAL LOW (ref 4.22–5.81)
RDW: 17.9 % — ABNORMAL HIGH (ref 11.5–15.5)
WBC Count: 9.3 10*3/uL (ref 4.0–10.5)
nRBC: 0 % (ref 0.0–0.2)

## 2019-11-24 LAB — TSH: TSH: 1.402 u[IU]/mL (ref 0.320–4.118)

## 2019-11-24 MED ORDER — SODIUM CHLORIDE 0.9% FLUSH
10.0000 mL | INTRAVENOUS | Status: DC | PRN
  Administered 2019-11-24: 10 mL
  Filled 2019-11-24: qty 10

## 2019-11-24 MED ORDER — OXYCODONE-ACETAMINOPHEN 5-325 MG PO TABS: 1.0000 | ORAL_TABLET | Freq: Three times a day (TID) | ORAL | 0 refills | Status: DC | PRN

## 2019-11-24 MED ORDER — HEPARIN SOD (PORK) LOCK FLUSH 100 UNIT/ML IV SOLN
500.0000 [IU] | Freq: Once | INTRAVENOUS | Status: AC | PRN
  Administered 2019-11-24: 500 [IU]
  Filled 2019-11-24: qty 5

## 2019-11-24 NOTE — Progress Notes (Signed)
[place] [phone]   [phone]  OFFICE PROGRESS NOTE  [name], MD [address] 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  PD-L1 expression 20%.  PRIOR THERAPY: Palliative radiotherapy under the care of Dr. Sondra Come to the obstructive right lower lobe lung mass.  CURRENT THERAPY: Systemic chemotherapy with carboplatin for AUC of 5, paclitaxel 175 mg/M2 and Keytruda 200 mg IV every 3 weeks with Neulasta support.  First dose September 02, 2019.  Status post 4 cycles  INTERVAL HISTORY: Jeremy [name] 74 y.o. male returns to the clinic today for follow-up visit accompanied by his wife.  The patient continues to complain of increasing fatigue and weakness.  He underwent ultrasound-guided thoracentesis during recent hospitalization.  The patient denied having any current chest pain, shortness of breath except with exertion with no cough or hemoptysis.  He denied having any fever or chills.  He has no nausea, vomiting, diarrhea or constipation.  He continues to have skin rash secondary to psoriatic arthritis.  The patient denied having any recent weight loss or night sweats.  He was in the hospital recently and treated for suspicious empyema.  He is here today for evaluation before resuming his treatment.   MEDICAL HISTORY: Past Medical History:  Diagnosis Date  . Cancer ([facility])    mass right lung  . COPD (chronic obstructive pulmonary disease) ([facility])    QUIT SMOKING 30 YRS AGO  . Diabetes ([facility]) 2018  . HTN (hypertension)   . Psoriatic arthritis ([facility]) 2015    ALLERGIES:  is allergic to chlorhexidine and aleve [naproxen sodium].  MEDICATIONS:  Current Outpatient Medications  Medication Sig Dispense Refill  . acetaminophen (TYLENOL) 325 MG tablet Take 2 tablets (650 mg total) by mouth every 6 (six) hours as needed for mild pain,  fever or headache. 120 tablet 0  . albuterol (VENTOLIN HFA) 108 (90 Base) MCG/ACT inhaler Inhale 2 puffs into the lungs every 6 (six) hours as needed for wheezing or shortness of breath.    . diltiazem (CARDIZEM CD) 360 MG 24 hr capsule Take 1 capsule (360 mg total) by mouth daily. 30 capsule 3  . doxycycline (MONODOX) 50 MG capsule Take 50 mg by mouth 2 (two) times daily as needed (rosacea flare ups.).    Marland Kitchen ferrous sulfate 325 (65 FE) MG tablet Take 325 mg by mouth daily with supper.    Marland Kitchen FLOVENT HFA 110 MCG/ACT inhaler Inhale 1 puff into the lungs 2 (two) times daily.     . folic acid (FOLVITE) 1 MG tablet Take 1 mg by mouth daily.    Marland Kitchen gabapentin (NEURONTIN) 300 MG capsule Take 600 mg by mouth at bedtime.    . lidocaine-prilocaine (EMLA) cream Apply to the Port-A-Cath site 30 minutes before treatment 30 g 0  . loratadine (CLARITIN) 10 MG tablet Take 10 mg by mouth daily.    . metFORMIN (GLUCOPHAGE-XR) 500 MG 24 hr tablet Take 500-1,000 mg by mouth See admin instructions. Take 2 tablets (1000 mg) by mouth in the morning & 1 tablet (500 mg) by mouth at night.    . metoprolol tartrate (LOPRESSOR) 25 MG tablet Take 1 tablet (25 mg total) by mouth 2 (two) times daily. 60 tablet 2  . metroNIDAZOLE (METROCREAM) 0.75 % cream Apply 1 application topically 2 (two) times daily as needed (facial rosacea).     Marland Kitchen  mometasone (ELOCON) 0.1 % cream Apply 1 application topically 2 (two) times daily as needed (psoriasis).     . Omega-3 Fatty Acids (FISH OIL) 1000 MG CAPS Take 1,000 mg by mouth daily.     . pantoprazole (PROTONIX) 40 MG tablet 1 po 30 mins prior to first meal 30 tablet 11  . prochlorperazine (COMPAZINE) 10 MG tablet Take 1 tablet (10 mg total) by mouth every 6 (six) hours as needed for nausea or vomiting. 30 tablet 0  . senna-docusate (SENOKOT-S) 8.6-50 MG tablet Take 2 tablets by mouth at bedtime. 60 tablet 1  . traMADol (ULTRAM) 50 MG tablet Take 50 mg by mouth every 4 (four) hours as needed for  moderate pain. for pain    . triamcinolone lotion (KENALOG) 0.1 % Apply 1 application topically 4 (four) times daily. 120 mL 1  . umeclidinium-vilanterol (ANORO ELLIPTA) 62.5-25 MCG/INH AEPB Inhale 1 puff into the lungs daily. 30 each 0   No current facility-administered medications for this visit.    SURGICAL HISTORY:  Past Surgical History:  Procedure Laterality Date  . BACK SURGERY  1990  . BIOPSY  12/30/2018   Procedure: BIOPSY;  Surgeon: Danie Binder, MD;  [facility];  Service: Endoscopy;;  gastric  . CATARACT EXTRACTION Bilateral   . CERVICAL SPINE SURGERY  1995  . COLONOSCOPY WITH PROPOFOL N/A 12/30/2018   Procedure: COLONOSCOPY WITH PROPOFOL;  Surgeon: Danie Binder, MD;  [facility]: [facility];  Service: Endoscopy;  Laterality: N/A;  12:30pm  . ESOPHAGOGASTRODUODENOSCOPY (EGD) WITH PROPOFOL N/A 12/30/2018   Procedure: ESOPHAGOGASTRODUODENOSCOPY (EGD) WITH PROPOFOL;  Surgeon: Danie Binder, MD;  [facility]: [facility];  Service: Endoscopy;  Laterality: N/A;  . IR IMAGING GUIDED PORT INSERTION  09/04/2019  . POLYPECTOMY  12/30/2018   Procedure: POLYPECTOMY;  Surgeon: Danie Binder, MD;  [facility]: [facility];  Service: Endoscopy;;  colon  . THORACENTESIS Right 08/08/2019   Procedure: Thoracentesis;  Surgeon: [name], MD;  Location: [facility];  Service: Pulmonary;  Laterality: Right;  Marland Kitchen VIDEO BRONCHOSCOPY Right 08/08/2019   Procedure: Video Bronchoscopy with Erbe Cryo Biopsy of Right mainstem;  Surgeon: [name], MD;  Location: [facility] OR;  Service: Pulmonary;  Laterality: Right;    REVIEW OF SYSTEMS:  Constitutional: positive for fatigue Eyes: negative Ears, nose, mouth, throat, and face: negative Respiratory: positive for dyspnea on exertion Cardiovascular: negative Gastrointestinal: negative Genitourinary:negative Integument/breast: negative Hematologic/lymphatic: negative Musculoskeletal:negative Neurological: negative Behavioral/Psych:  negative Endocrine: negative Allergic/Immunologic: negative   PHYSICAL EXAMINATION: General appearance: alert, cooperative, fatigued and no distress Head: Normocephalic, without obvious abnormality, atraumatic Neck: no adenopathy, no JVD, supple, symmetrical, trachea midline and thyroid not enlarged, symmetric, no tenderness/mass/nodules Lymph nodes: Cervical, supraclavicular, and axillary nodes normal. Resp: clear to auscultation bilaterally Back: symmetric, no curvature. ROM normal. No CVA tenderness. Cardio: regular rate and rhythm, S1, S2 normal, no murmur, click, rub or gallop GI: soft, non-tender; bowel sounds normal; no masses,  no organomegaly Extremities: extremities normal, atraumatic, no cyanosis or edema Neurologic: Alert and oriented X 3, normal strength and tone. Normal symmetric reflexes. Normal coordination and gait  ECOG PERFORMANCE STATUS: 1 - Symptomatic but completely ambulatory  Blood pressure 109/72, pulse 87, temperature 97.9 F (36.6 C), temperature source Temporal, resp. rate 17, height '6\' 2"'  (1.88 m), weight 161 lb 4.8 oz (73.2 kg), SpO2 96 %.  LABORATORY DATA: Lab Results  Component Value Date   WBC 9.3 11/24/2019   HGB 8.5 (L) 11/24/2019  HCT 28.4 (L) 11/24/2019   MCV 92.2 11/24/2019   PLT 248 11/24/2019      Chemistry      Component Value Date/Time   NA 136 11/18/2019 0319   K 4.1 11/18/2019 0319   CL 96 (L) 11/18/2019 0319   CO2 31 11/18/2019 0319   BUN 19 11/18/2019 0319   CREATININE 0.76 11/18/2019 0319   CREATININE 0.76 11/05/2019 0942      Component Value Date/Time   CALCIUM 7.9 (L) 11/18/2019 0319   ALKPHOS 59 11/09/2019 1559   AST 16 11/09/2019 1559   AST 17 11/05/2019 0942   ALT 20 11/09/2019 1559   ALT 20 11/05/2019 0942   BILITOT 0.7 11/09/2019 1559   BILITOT 0.4 11/05/2019 0942       RADIOGRAPHIC STUDIES: DG Chest 2 View  Result Date: 11/13/2019 CLINICAL DATA:  Chest tube placement EXAM: CHEST - 2 VIEW COMPARISON:   11/08/2019 FINDINGS: Interval placement of right-sided pigtail pleural drainage catheter with significant decrease in the size of the loculated right-sided pleural effusion. Stable positioning right-sided chest port. Heart size is stable. Hazy reticulonodular opacity within the right lung base with improved aeration from prior. Left lung is clear. No pneumothorax. IMPRESSION: 1. Interval placement of right-sided pigtail pleural drainage catheter with significant decrease in size of the loculated right-sided pleural effusion. 2. Hazy reticulonodular opacity within the right lung base with improved aeration from prior. Electronically Signed   By: Davina Poke D.O.   On: 11/13/2019 14:51   DG Chest 2 View  Result Date: 11/08/2019 CLINICAL DATA:  Fever, cough, chills, stage IV lung cancer EXAM: CHEST - 2 VIEW COMPARISON:  10/19/2019, 11/07/2019 FINDINGS: Single frontal view of the chest demonstrates stable right chest wall port. Loculated right pleural effusion unchanged, with compressive atelectasis at the right lung base. No new airspace disease. No pneumothorax. Cardiac silhouette is stable. No acute bony abnormalities. IMPRESSION: 1. Stable loculated right pleural effusion with compressive atelectasis at the right lung base. Electronically Signed   By: Randa Ngo M.D.   On: 11/08/2019 21:16   CT Chest W Contrast  Result Date: 11/07/2019 CLINICAL DATA:  Non-small cell lung cancer staging, reportedly post chemo and radiotherapy. Currently on immunotherapy as well. EXAM: CT CHEST, ABDOMEN, AND PELVIS WITH CONTRAST TECHNIQUE: Multidetector CT imaging of the chest, abdomen and pelvis was performed following the standard protocol during bolus administration of intravenous contrast. CONTRAST:  167m OMNIPAQUE IOHEXOL 300 MG/ML  SOLN COMPARISON:  08/11/2019 FINDINGS: CT CHEST FINDINGS Cardiovascular: RIGHT-sided Port-A-Cath terminates at the caval to atrial junction. Heart size is normal. There is mild  pericardial nodularity suggested along the RIGHT heart border on image 45 series 2 with small pericardial effusion and pre pericardial lymph nodes. Pericardial effusion is diminished. Central pulmonary vasculature is unremarkable on venous phase. RIGHT hilum remains encased by soft tissue. Mediastinum/Nodes: No thoracic inlet adenopathy. No axillary lymphadenopathy. Soft tissue in case mint of RIGHT bronchial structures is improved as is soft tissue within the RIGHT mainstem bronchus and RIGHT lower lobe bronchus. Scattered small mediastinal lymph nodes. Diminished subcarinal nodal tissue largest measuring 1.6 cm short axis previously approximately 3.2 cm short axis. No LEFT hilar nodal tissue. Small lymph node adjacent to the distal esophagus and medial to markedly improved masslike area in the RIGHT lower lobe measures 1 cm (image 44, series 2) Lungs/Pleura: Increased size of loculated pleural fluid in the RIGHT chest. Rim of enhancement has developed. Pleural fluid in the RIGHT chest measuring 17 x 9  cm on image 40 of series 2. Diminished size of the dominant masslike area in the RIGHT lower lobe. In an area that may simply represent treated tumor in collapsed lung measuring 6.2 x 4.3 cm corresponding to the necrotic appearing mass in the inferior RIGHT chest on the previous CT from August 06, 2019 that measured 10 x 8.5 cm. Margins are no longer bulging and rounded. Volume loss in the setting of enlarging pleural fluid in the RIGHT lower lobe. Diminished nodularity throughout the RIGHT upper lobe. New area of nodularity in the LEFT lung base (image 90, series 3) 1.8 cm x 0.8 cm. No pleural effusion in the LEFT chest. Motion limited assessment at the LEFT lung base. Musculoskeletal: No sign of chest wall mass. See below for full musculoskeletal details. CT ABDOMEN PELVIS FINDINGS Hepatobiliary: No focal suspicious hepatic lesion. No biliary ductal dilation. No pericholecystic stranding. Portal vein is patent.  Pancreas: Pancreas is normal without ductal dilation, inflammation or pancreatic lesion. Spleen: Spleen normal size without focal lesion. Adrenals/Urinary Tract: Adrenal glands are normal. Kidneys enhance symmetrically without sign of focal lesion or evidence of hydronephrosis. Stomach/Bowel: No acute gastrointestinal process. Colonic diverticulosis without sign of diverticulitis. Vascular/Lymphatic: Calcified atheromatous plaque of the abdominal aorta. No aneurysm. No adenopathy in the retroperitoneum. No retrocrural lymphadenopathy. No pelvic lymphadenopathy. Reproductive: Prostate unremarkable by CT. Other: No abdominal wall hernia or abnormality. No abdominopelvic ascites. Musculoskeletal: Spinal degenerative changes. No acute or destructive bone process. IMPRESSION: 1. Increased size of loculated pleural fluid in the RIGHT chest with new rim of enhancement. Findings may represent enlarging reactive effusion following radiation. Correlate with any clinical signs of infection as empyema could have a similar appearance. Given size of effusion would also correlate with any worsening shortness of breath. No gas present within the fluid. 2. Volume loss in the RIGHT chest increasing as the masslike area in the medial RIGHT chest as diminished in size as described. Also with resolution of upper lobe nodules. 3. Motion limited assessment of lung parenchyma but with new area of nodularity at the LEFT lung base, consider close attention on short interval follow-up. 4. Diminished size of mediastinal lymph nodes within the subcarinal region in particular. 5. No evidence of metastatic disease in the abdomen or pelvis. 6. Aortic atherosclerosis. Aortic Atherosclerosis (ICD10-I70.0). Electronically Signed   By: Zetta Bills M.D.   On: 11/07/2019 15:55   CT Abdomen Pelvis W Contrast  Result Date: 11/07/2019 CLINICAL DATA:  Non-small cell lung cancer staging, reportedly post chemo and radiotherapy. Currently on  immunotherapy as well. EXAM: CT CHEST, ABDOMEN, AND PELVIS WITH CONTRAST TECHNIQUE: Multidetector CT imaging of the chest, abdomen and pelvis was performed following the standard protocol during bolus administration of intravenous contrast. CONTRAST:  183m OMNIPAQUE IOHEXOL 300 MG/ML  SOLN COMPARISON:  08/11/2019 FINDINGS: CT CHEST FINDINGS Cardiovascular: RIGHT-sided Port-A-Cath terminates at the caval to atrial junction. Heart size is normal. There is mild pericardial nodularity suggested along the RIGHT heart border on image 45 series 2 with small pericardial effusion and pre pericardial lymph nodes. Pericardial effusion is diminished. Central pulmonary vasculature is unremarkable on venous phase. RIGHT hilum remains encased by soft tissue. Mediastinum/Nodes: No thoracic inlet adenopathy. No axillary lymphadenopathy. Soft tissue in case mint of RIGHT bronchial structures is improved as is soft tissue within the RIGHT mainstem bronchus and RIGHT lower lobe bronchus. Scattered small mediastinal lymph nodes. Diminished subcarinal nodal tissue largest measuring 1.6 cm short axis previously approximately 3.2 cm short axis. No LEFT hilar nodal tissue.  Small lymph node adjacent to the distal esophagus and medial to markedly improved masslike area in the RIGHT lower lobe measures 1 cm (image 44, series 2) Lungs/Pleura: Increased size of loculated pleural fluid in the RIGHT chest. Rim of enhancement has developed. Pleural fluid in the RIGHT chest measuring 17 x 9 cm on image 40 of series 2. Diminished size of the dominant masslike area in the RIGHT lower lobe. In an area that may simply represent treated tumor in collapsed lung measuring 6.2 x 4.3 cm corresponding to the necrotic appearing mass in the inferior RIGHT chest on the previous CT from August 06, 2019 that measured 10 x 8.5 cm. Margins are no longer bulging and rounded. Volume loss in the setting of enlarging pleural fluid in the RIGHT lower lobe. Diminished  nodularity throughout the RIGHT upper lobe. New area of nodularity in the LEFT lung base (image 90, series 3) 1.8 cm x 0.8 cm. No pleural effusion in the LEFT chest. Motion limited assessment at the LEFT lung base. Musculoskeletal: No sign of chest wall mass. See below for full musculoskeletal details. CT ABDOMEN PELVIS FINDINGS Hepatobiliary: No focal suspicious hepatic lesion. No biliary ductal dilation. No pericholecystic stranding. Portal vein is patent. Pancreas: Pancreas is normal without ductal dilation, inflammation or pancreatic lesion. Spleen: Spleen normal size without focal lesion. Adrenals/Urinary Tract: Adrenal glands are normal. Kidneys enhance symmetrically without sign of focal lesion or evidence of hydronephrosis. Stomach/Bowel: No acute gastrointestinal process. Colonic diverticulosis without sign of diverticulitis. Vascular/Lymphatic: Calcified atheromatous plaque of the abdominal aorta. No aneurysm. No adenopathy in the retroperitoneum. No retrocrural lymphadenopathy. No pelvic lymphadenopathy. Reproductive: Prostate unremarkable by CT. Other: No abdominal wall hernia or abnormality. No abdominopelvic ascites. Musculoskeletal: Spinal degenerative changes. No acute or destructive bone process. IMPRESSION: 1. Increased size of loculated pleural fluid in the RIGHT chest with new rim of enhancement. Findings may represent enlarging reactive effusion following radiation. Correlate with any clinical signs of infection as empyema could have a similar appearance. Given size of effusion would also correlate with any worsening shortness of breath. No gas present within the fluid. 2. Volume loss in the RIGHT chest increasing as the masslike area in the medial RIGHT chest as diminished in size as described. Also with resolution of upper lobe nodules. 3. Motion limited assessment of lung parenchyma but with new area of nodularity at the LEFT lung base, consider close attention on short interval follow-up. 4.  Diminished size of mediastinal lymph nodes within the subcarinal region in particular. 5. No evidence of metastatic disease in the abdomen or pelvis. 6. Aortic atherosclerosis. Aortic Atherosclerosis (ICD10-I70.0). Electronically Signed   By: Zetta Bills M.D.   On: 11/07/2019 15:55   DG Chest Port 1 View  Result Date: 11/17/2019 CLINICAL DATA:  Status post chest tube removal.  Pleural effusion EXAM: PORTABLE CHEST 1 VIEW COMPARISON:  November 17, 2019 FINDINGS: Chest tube has been removed on the right. Port-A-Cath tip is in the superior vena cava. No pneumothorax. There is loculated pleural effusion on the right with atelectasis in portions of the right mid and lower lung regions. Left lung is clear. Heart size and pulmonary vascularity are normal. No adenopathy. There is aortic atherosclerosis. No bone lesions. IMPRESSION: Removal of chest tube on the right without pneumothorax. Stable Port-A-Cath position. Loculated pleural effusion on the right with atelectatic change in the right mid lower lung zones. Left lung clear. No new opacity evident. Stable cardiac silhouette. Aortic Atherosclerosis (ICD10-I70.0). Electronically Signed   By: Gwyndolyn Saxon  Jasmine December III M.D.   On: 11/17/2019 10:16   DG Chest Port 1 View  Result Date: 11/17/2019 CLINICAL DATA:  Lung carcinoma and right empyema. EXAM: PORTABLE CHEST 1 VIEW COMPARISON:  11/16/2019 FINDINGS: Stable cardiac enlargement. Pigtail catheter remains in the right pleural space in stable position. Stable small amount of residual air in the lateral right pleural space with an area some residual pleural thickening/fluid. Improved aeration at both lung bases. No pulmonary edema. Stable positioning of Port-A-Cath. IMPRESSION: Stable small amount of residual air in the lateral right pleural space with an area residual pleural thickening/fluid. Electronically Signed   By: Aletta Edouard M.D.   On: 11/17/2019 07:46   DG CHEST PORT 1 VIEW  Result Date:  11/16/2019 CLINICAL DATA:  Lung carcinoma with cough and pneumonia EXAM: PORTABLE CHEST 1 VIEW COMPARISON:  November 15, 2019 FINDINGS: Port-A-Cath tip is in the superior vena cava. Pigtail catheter remains on the right. Small stable lateral right pneumothorax. There is persistent loculated pleural effusion on the right with areas of consolidation atelectasis throughout the right mid and lower lung zone regions. There is atelectatic change in the left base with equivocal left pleural effusion. Left lung otherwise clear. Heart size is normal. Pulmonary vascular is normal. No adenopathy. No bone lesions. IMPRESSION: Tube and catheter positions as described. Small lateral right pneumothorax is stable. Loculated pleural effusion on the right with areas of atelectasis and consolidation in the right mid and lower lung zones are stable. Mild left base atelectasis with equivocal left pleural effusion. Stable cardiac silhouette. Appearance similar to 1 day prior. Electronically Signed   By: Lowella Grip III M.D.   On: 11/16/2019 11:48   DG CHEST PORT 1 VIEW  Result Date: 11/15/2019 CLINICAL DATA:  Pleural effusion EXAM: PORTABLE CHEST 1 VIEW COMPARISON:  11/14/2019 FINDINGS: Pigtail catheter is again identified on the right stable in appearance. Small residual pneumothorax is again seen. Underlying parenchymal opacity is again seen and stable. No significant increase in the degree of pleural fluid is noted. Right chest wall port is seen. Cardiac shadow is stable. Left lung remains clear. IMPRESSION: Stable chest stable appearance of the chest when compared with the previous day. Electronically Signed   By: Inez Catalina M.D.   On: 11/15/2019 15:09   DG CHEST PORT 1 VIEW  Result Date: 11/14/2019 CLINICAL DATA:  Chest tube in place. EXAM: PORTABLE CHEST 1 VIEW COMPARISON:  Radiograph yesterday.  CT 11/07/2019 FINDINGS: Right chest port remains in place. Right pigtail catheter in place. Partially loculated right pleural  effusion appears similar to yesterday, possible small partially loculated lateral pneumothorax. Persistent opacities throughout the right mid lower lung zone. Rightward mediastinal shift is unchanged. Left lung is clear. IMPRESSION: Right pigtail catheter remains in place with small volume of residual partially loculated pleural fluid. Possible small lateral loculated pneumothorax versus loculated fluid, recommend continued radiographic follow-up. Exam is otherwise unchanged from yesterday. Electronically Signed   By: Keith Rake M.D.   On: 11/14/2019 16:45   CT IMAGE GUIDED DRAINAGE BY PERCUTANEOUS CATHETER  Result Date: 11/11/2019 INDICATION: 74 year old male with large right-sided pleural effusion with enhancing pleura concerning for empyema. EXAM: CT-guided chest tube placement MEDICATIONS: The patient is currently admitted to the hospital and receiving intravenous antibiotics. The antibiotics were administered within an appropriate time frame prior to the initiation of the procedure. ANESTHESIA/SEDATION: 1 mg Versed COMPLICATIONS: None immediate. PROCEDURE: Informed written consent was obtained from the patient after a thorough discussion of the procedural risks, benefits  and alternatives. All questions were addressed. A timeout was performed prior to the initiation of the procedure. An axial CT scan was performed. The large right-sided pleural effusion was identified. A suitable skin entry site was selected and marked. The overlying skin was sterilely prepped and draped in standard fashion using chlorhexidine skin prep. Local anesthesia was attained by infiltration with 1% lidocaine. A small dermatotomy was made. Using trocar technique, a 12 French drainage catheter was advanced into the pleural space and positioned superiorly within the pleural effusion. The drainage catheter was connected to low wall suction via a [employer] device at -20 cm H2O. The catheter was secured to the skin with 0 Prolene  suture. An air type dressing was applied. There was immediate aspiration of copious amounts of turbid pleural fluid. IMPRESSION: Successful placement of 12 French right-sided pigtail thoracostomy tube. Electronically Signed   By: Jacqulynn Cadet M.D.   On: 11/11/2019 09:04    ASSESSMENT AND PLAN: This is a very pleasant 74 years old white male with stage IIIc/IV (T4, N2, M0/M1 a) non-small cell lung cancer, squamous cell carcinoma presented with large necrotic mass extending to the carina with associated obstruction of the right lower lobe bronchus and postobstructive collapse/consolidation and necrosis in the right lower lobe as well as mediastinal lymphadenopathy with 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   The patient has PD-L1 expression of 20%. The patient completed a course of palliative radiotherapy to the obstructive right lower lobe lung mass under the care of Dr. Sondra Come.  He is feeling a little bit better with less shortness of breath and chest pain. The patient is currently undergoing systemic chemotherapy with carboplatin and paclitaxel.  Status post 4 cycles.  His treatment with [name] was discontinued after cycle #1 secondary to significant skin rash.  He was treated with a tapered dose of prednisone. The patient is doing well except for the persistent fatigue and weakness after the recent hospitalization. He had repeat CT scan of the chest during his hospitalization that showed improvement of his disease except for the enlargement of the pleural fluid. I recommended for the patient to continue his current systemic chemotherapy but we will delay the start of the next cycle of treatment by 2 weeks. For the pain management, he is currently on tramadol by his rheumatologist.  I give him a short course of Percocet until he discussed with his rheumatologist adjustment of his medication. For the chemotherapy-induced anemia, we will continue to monitor his  hemoglobin and hematocrit closely and consider the patient for transfusion if needed. The patient will come back for follow-up visit in 2 weeks for evaluation before starting the next cycle of his treatment. He was advised to call immediately if he has any concerning symptoms in the interval. The patient voices understanding of current disease status and treatment options and is in agreement with the current care plan.  All questions were answered. The patient knows to call the clinic with any problems, questions or concerns. We can certainly see the patient much sooner if necessary.  Disclaimer: This note was dictated with voice recognition software. Similar sounding words can inadvertently be transcribed and may not be corrected upon review.

## 2019-11-24 NOTE — Progress Notes (Signed)
Treatment was cancelled so nutrition follow up could not be completed.

## 2019-11-25 DIAGNOSIS — J189 Pneumonia, unspecified organism: Secondary | ICD-10-CM | POA: Diagnosis not present

## 2019-11-25 DIAGNOSIS — N390 Urinary tract infection, site not specified: Secondary | ICD-10-CM | POA: Diagnosis not present

## 2019-11-25 DIAGNOSIS — C3491 Malignant neoplasm of unspecified part of right bronchus or lung: Secondary | ICD-10-CM | POA: Diagnosis not present

## 2019-11-25 DIAGNOSIS — J440 Chronic obstructive pulmonary disease with acute lower respiratory infection: Secondary | ICD-10-CM | POA: Diagnosis not present

## 2019-11-25 DIAGNOSIS — B952 Enterococcus as the cause of diseases classified elsewhere: Secondary | ICD-10-CM | POA: Diagnosis not present

## 2019-11-25 DIAGNOSIS — J9621 Acute and chronic respiratory failure with hypoxia: Secondary | ICD-10-CM | POA: Diagnosis not present

## 2019-11-26 ENCOUNTER — Inpatient Hospital Stay: Payer: Medicare Other

## 2019-11-26 DIAGNOSIS — J9621 Acute and chronic respiratory failure with hypoxia: Secondary | ICD-10-CM | POA: Diagnosis not present

## 2019-11-26 DIAGNOSIS — J440 Chronic obstructive pulmonary disease with acute lower respiratory infection: Secondary | ICD-10-CM | POA: Diagnosis not present

## 2019-11-26 DIAGNOSIS — N390 Urinary tract infection, site not specified: Secondary | ICD-10-CM | POA: Diagnosis not present

## 2019-11-26 DIAGNOSIS — J189 Pneumonia, unspecified organism: Secondary | ICD-10-CM | POA: Diagnosis not present

## 2019-11-26 DIAGNOSIS — B952 Enterococcus as the cause of diseases classified elsewhere: Secondary | ICD-10-CM | POA: Diagnosis not present

## 2019-11-26 DIAGNOSIS — C3491 Malignant neoplasm of unspecified part of right bronchus or lung: Secondary | ICD-10-CM | POA: Diagnosis not present

## 2019-11-27 ENCOUNTER — Telehealth: Payer: Self-pay | Admitting: Internal Medicine

## 2019-11-27 NOTE — Telephone Encounter (Signed)
Scheduled appt per 6/21 sch message - pt wife aware of appt begin decoupled for 7/7 and 7/8

## 2019-12-03 DIAGNOSIS — J9621 Acute and chronic respiratory failure with hypoxia: Secondary | ICD-10-CM | POA: Diagnosis not present

## 2019-12-03 DIAGNOSIS — C3491 Malignant neoplasm of unspecified part of right bronchus or lung: Secondary | ICD-10-CM | POA: Diagnosis not present

## 2019-12-03 DIAGNOSIS — J189 Pneumonia, unspecified organism: Secondary | ICD-10-CM | POA: Diagnosis not present

## 2019-12-03 DIAGNOSIS — B952 Enterococcus as the cause of diseases classified elsewhere: Secondary | ICD-10-CM | POA: Diagnosis not present

## 2019-12-03 DIAGNOSIS — J440 Chronic obstructive pulmonary disease with acute lower respiratory infection: Secondary | ICD-10-CM | POA: Diagnosis not present

## 2019-12-03 DIAGNOSIS — N390 Urinary tract infection, site not specified: Secondary | ICD-10-CM | POA: Diagnosis not present

## 2019-12-05 DIAGNOSIS — J9621 Acute and chronic respiratory failure with hypoxia: Secondary | ICD-10-CM | POA: Diagnosis not present

## 2019-12-05 DIAGNOSIS — C3491 Malignant neoplasm of unspecified part of right bronchus or lung: Secondary | ICD-10-CM | POA: Diagnosis not present

## 2019-12-05 DIAGNOSIS — J189 Pneumonia, unspecified organism: Secondary | ICD-10-CM | POA: Diagnosis not present

## 2019-12-05 DIAGNOSIS — J440 Chronic obstructive pulmonary disease with acute lower respiratory infection: Secondary | ICD-10-CM | POA: Diagnosis not present

## 2019-12-05 DIAGNOSIS — N390 Urinary tract infection, site not specified: Secondary | ICD-10-CM | POA: Diagnosis not present

## 2019-12-05 DIAGNOSIS — B952 Enterococcus as the cause of diseases classified elsewhere: Secondary | ICD-10-CM | POA: Diagnosis not present

## 2019-12-06 DIAGNOSIS — R627 Adult failure to thrive: Secondary | ICD-10-CM | POA: Diagnosis not present

## 2019-12-06 DIAGNOSIS — N390 Urinary tract infection, site not specified: Secondary | ICD-10-CM | POA: Diagnosis not present

## 2019-12-06 DIAGNOSIS — Z87891 Personal history of nicotine dependence: Secondary | ICD-10-CM | POA: Diagnosis not present

## 2019-12-06 DIAGNOSIS — Z7984 Long term (current) use of oral hypoglycemic drugs: Secondary | ICD-10-CM | POA: Diagnosis not present

## 2019-12-06 DIAGNOSIS — I5032 Chronic diastolic (congestive) heart failure: Secondary | ICD-10-CM | POA: Diagnosis not present

## 2019-12-06 DIAGNOSIS — B957 Other staphylococcus as the cause of diseases classified elsewhere: Secondary | ICD-10-CM | POA: Diagnosis not present

## 2019-12-06 DIAGNOSIS — C3491 Malignant neoplasm of unspecified part of right bronchus or lung: Secondary | ICD-10-CM | POA: Diagnosis not present

## 2019-12-06 DIAGNOSIS — J90 Pleural effusion, not elsewhere classified: Secondary | ICD-10-CM | POA: Diagnosis not present

## 2019-12-06 DIAGNOSIS — Z79899 Other long term (current) drug therapy: Secondary | ICD-10-CM | POA: Diagnosis not present

## 2019-12-06 DIAGNOSIS — J440 Chronic obstructive pulmonary disease with acute lower respiratory infection: Secondary | ICD-10-CM | POA: Diagnosis not present

## 2019-12-06 DIAGNOSIS — I110 Hypertensive heart disease with heart failure: Secondary | ICD-10-CM | POA: Diagnosis not present

## 2019-12-06 DIAGNOSIS — E119 Type 2 diabetes mellitus without complications: Secondary | ICD-10-CM | POA: Diagnosis not present

## 2019-12-06 DIAGNOSIS — J9621 Acute and chronic respiratory failure with hypoxia: Secondary | ICD-10-CM | POA: Diagnosis not present

## 2019-12-06 DIAGNOSIS — L4050 Arthropathic psoriasis, unspecified: Secondary | ICD-10-CM | POA: Diagnosis not present

## 2019-12-06 DIAGNOSIS — D630 Anemia in neoplastic disease: Secondary | ICD-10-CM | POA: Diagnosis not present

## 2019-12-06 DIAGNOSIS — I480 Paroxysmal atrial fibrillation: Secondary | ICD-10-CM | POA: Diagnosis not present

## 2019-12-06 DIAGNOSIS — B952 Enterococcus as the cause of diseases classified elsewhere: Secondary | ICD-10-CM | POA: Diagnosis not present

## 2019-12-06 DIAGNOSIS — J189 Pneumonia, unspecified organism: Secondary | ICD-10-CM | POA: Diagnosis not present

## 2019-12-09 DIAGNOSIS — J9621 Acute and chronic respiratory failure with hypoxia: Secondary | ICD-10-CM | POA: Diagnosis not present

## 2019-12-09 DIAGNOSIS — J189 Pneumonia, unspecified organism: Secondary | ICD-10-CM | POA: Diagnosis not present

## 2019-12-09 DIAGNOSIS — J440 Chronic obstructive pulmonary disease with acute lower respiratory infection: Secondary | ICD-10-CM | POA: Diagnosis not present

## 2019-12-09 DIAGNOSIS — N390 Urinary tract infection, site not specified: Secondary | ICD-10-CM | POA: Diagnosis not present

## 2019-12-09 DIAGNOSIS — B952 Enterococcus as the cause of diseases classified elsewhere: Secondary | ICD-10-CM | POA: Diagnosis not present

## 2019-12-09 DIAGNOSIS — C3491 Malignant neoplasm of unspecified part of right bronchus or lung: Secondary | ICD-10-CM | POA: Diagnosis not present

## 2019-12-10 ENCOUNTER — Inpatient Hospital Stay: Payer: Medicare Other

## 2019-12-10 ENCOUNTER — Other Ambulatory Visit: Payer: Self-pay

## 2019-12-10 ENCOUNTER — Encounter: Payer: Self-pay | Admitting: Internal Medicine

## 2019-12-10 ENCOUNTER — Inpatient Hospital Stay: Payer: Medicare Other | Attending: Internal Medicine | Admitting: Internal Medicine

## 2019-12-10 VITALS — BP 106/69 | HR 88 | Temp 97.2°F | Resp 17 | Ht 74.0 in | Wt 160.4 lb

## 2019-12-10 DIAGNOSIS — D649 Anemia, unspecified: Secondary | ICD-10-CM

## 2019-12-10 DIAGNOSIS — J910 Malignant pleural effusion: Secondary | ICD-10-CM | POA: Insufficient documentation

## 2019-12-10 DIAGNOSIS — Z79899 Other long term (current) drug therapy: Secondary | ICD-10-CM | POA: Insufficient documentation

## 2019-12-10 DIAGNOSIS — J449 Chronic obstructive pulmonary disease, unspecified: Secondary | ICD-10-CM | POA: Diagnosis not present

## 2019-12-10 DIAGNOSIS — I10 Essential (primary) hypertension: Secondary | ICD-10-CM

## 2019-12-10 DIAGNOSIS — Z7951 Long term (current) use of inhaled steroids: Secondary | ICD-10-CM | POA: Diagnosis not present

## 2019-12-10 DIAGNOSIS — C3431 Malignant neoplasm of lower lobe, right bronchus or lung: Secondary | ICD-10-CM | POA: Insufficient documentation

## 2019-12-10 DIAGNOSIS — R5382 Chronic fatigue, unspecified: Secondary | ICD-10-CM

## 2019-12-10 DIAGNOSIS — T451X5A Adverse effect of antineoplastic and immunosuppressive drugs, initial encounter: Secondary | ICD-10-CM | POA: Diagnosis not present

## 2019-12-10 DIAGNOSIS — D630 Anemia in neoplastic disease: Secondary | ICD-10-CM | POA: Diagnosis not present

## 2019-12-10 DIAGNOSIS — E1136 Type 2 diabetes mellitus with diabetic cataract: Secondary | ICD-10-CM | POA: Insufficient documentation

## 2019-12-10 DIAGNOSIS — Z5111 Encounter for antineoplastic chemotherapy: Secondary | ICD-10-CM

## 2019-12-10 DIAGNOSIS — Z5189 Encounter for other specified aftercare: Secondary | ICD-10-CM | POA: Diagnosis not present

## 2019-12-10 DIAGNOSIS — D6481 Anemia due to antineoplastic chemotherapy: Secondary | ICD-10-CM | POA: Diagnosis not present

## 2019-12-10 DIAGNOSIS — C3491 Malignant neoplasm of unspecified part of right bronchus or lung: Secondary | ICD-10-CM

## 2019-12-10 DIAGNOSIS — R5383 Other fatigue: Secondary | ICD-10-CM | POA: Diagnosis not present

## 2019-12-10 DIAGNOSIS — Z7984 Long term (current) use of oral hypoglycemic drugs: Secondary | ICD-10-CM | POA: Diagnosis not present

## 2019-12-10 LAB — CMP (CANCER CENTER ONLY)
ALT: 14 U/L (ref 0–44)
AST: 12 U/L — ABNORMAL LOW (ref 15–41)
Albumin: 2.3 g/dL — ABNORMAL LOW (ref 3.5–5.0)
Alkaline Phosphatase: 73 U/L (ref 38–126)
Anion gap: 12 (ref 5–15)
BUN: 19 mg/dL (ref 8–23)
CO2: 25 mmol/L (ref 22–32)
Calcium: 9.6 mg/dL (ref 8.9–10.3)
Chloride: 101 mmol/L (ref 98–111)
Creatinine: 0.77 mg/dL (ref 0.61–1.24)
GFR, Est AFR Am: >60 mL/min (ref >60)
GFR, Estimated: >60 mL/min (ref >60)
Glucose, Bld: 143 mg/dL — ABNORMAL HIGH (ref 70–99)
Potassium: 4 mmol/L (ref 3.5–5.1)
Sodium: 138 mmol/L (ref 135–145)
Total Bilirubin: 0.3 mg/dL (ref 0.3–1.2)
Total Protein: 8.9 g/dL — ABNORMAL HIGH (ref 6.5–8.1)

## 2019-12-10 LAB — TYPE AND SCREEN
ABO/RH(D): A POS
Antibody Screen: NEGATIVE

## 2019-12-10 LAB — CBC WITH DIFFERENTIAL (CANCER CENTER ONLY)
Abs Immature Granulocytes: 0.11 10*3/uL — ABNORMAL HIGH (ref 0.00–0.07)
Basophils Absolute: 0 10*3/uL (ref 0.0–0.1)
Basophils Relative: 0 %
Eosinophils Absolute: 0.3 10*3/uL (ref 0.0–0.5)
Eosinophils Relative: 3 %
HCT: 26.2 % — ABNORMAL LOW (ref 39.0–52.0)
Hemoglobin: 8.1 g/dL — ABNORMAL LOW (ref 13.0–17.0)
Immature Granulocytes: 1 %
Lymphocytes Relative: 10 %
Lymphs Abs: 1 10*3/uL (ref 0.7–4.0)
MCH: 28 pg (ref 26.0–34.0)
MCHC: 30.9 g/dL (ref 30.0–36.0)
MCV: 90.7 fL (ref 80.0–100.0)
Monocytes Absolute: 0.6 10*3/uL (ref 0.1–1.0)
Monocytes Relative: 6 %
Neutro Abs: 7.3 10*3/uL (ref 1.7–7.7)
Neutrophils Relative %: 80 %
Platelet Count: 232 10*3/uL (ref 150–400)
RBC: 2.89 MIL/uL — ABNORMAL LOW (ref 4.22–5.81)
RDW: 18.1 % — ABNORMAL HIGH (ref 11.5–15.5)
WBC Count: 9.2 10*3/uL (ref 4.0–10.5)
nRBC: 0 % (ref 0.0–0.2)

## 2019-12-10 LAB — TSH: TSH: 0.977 u[IU]/mL (ref 0.320–4.118)

## 2019-12-10 NOTE — Progress Notes (Signed)
[name] [phone]   [phone]  OFFICE PROGRESS NOTE  [name], MD [address] 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  PD-L1 expression 20%.  PRIOR THERAPY: Palliative radiotherapy under the care of Dr. Sondra Come to the obstructive right lower lobe lung mass.  CURRENT THERAPY: Systemic chemotherapy with carboplatin for AUC of 5, paclitaxel 175 mg/M2 and Keytruda 200 mg IV every 3 weeks with Neulasta support.  First dose September 02, 2019.  Status post 3 cycles  INTERVAL HISTORY: Jeremy [name] 74 y.o. male returns to the clinic today for follow-up visit accompanied by his wife. The patient is feeling much better today with no concerning complaints except for fatigue. The rash in the upper and lower extremities has significantly improved. He continues to have mid back pain and he is currently on Percocet by his primary care physician. The patient has no current chest pain, shortness of breath, cough or hemoptysis. He denied having any fever or chills. He has no nausea, vomiting, diarrhea or constipation. He denied having any headache or visual changes. Is here today for evaluation before starting cycle #4 of his treatment.   MEDICAL HISTORY: Past Medical History:  Diagnosis Date  . Cancer ([place])    mass right lung  . COPD (chronic obstructive pulmonary disease) ([facility])    QUIT SMOKING 30 YRS AGO  . Diabetes ([place]) 2018  . HTN (hypertension)   . Psoriatic arthritis ([place]) 2015    ALLERGIES:  is allergic to chlorhexidine and aleve [naproxen sodium].  MEDICATIONS:  Current Outpatient Medications  Medication Sig Dispense Refill  . acetaminophen (TYLENOL) 325 MG tablet Take 2 tablets (650 mg total) by mouth every 6 (six) hours as needed for mild pain, fever or headache. 120 tablet 0  . albuterol (VENTOLIN HFA) 108 (90  Base) MCG/ACT inhaler Inhale 2 puffs into the lungs every 6 (six) hours as needed for wheezing or shortness of breath.    . diltiazem (CARDIZEM CD) 360 MG 24 hr capsule Take 1 capsule (360 mg total) by mouth daily. 30 capsule 3  . doxycycline (MONODOX) 50 MG capsule Take 50 mg by mouth 2 (two) times daily as needed (rosacea flare ups.).    Marland Kitchen ferrous sulfate 325 (65 FE) MG tablet Take 325 mg by mouth daily with supper.    Marland Kitchen FLOVENT HFA 110 MCG/ACT inhaler Inhale 1 puff into the lungs 2 (two) times daily.     . folic acid (FOLVITE) 1 MG tablet Take 1 mg by mouth daily.    Marland Kitchen gabapentin (NEURONTIN) 300 MG capsule Take 600 mg by mouth at bedtime.    . lidocaine-prilocaine (EMLA) cream Apply to the Port-A-Cath site 30 minutes before treatment 30 g 0  . loratadine (CLARITIN) 10 MG tablet Take 10 mg by mouth daily.    . metFORMIN (GLUCOPHAGE-XR) 500 MG 24 hr tablet Take 500-1,000 mg by mouth See admin instructions. Take 2 tablets (1000 mg) by mouth in the morning & 1 tablet (500 mg) by mouth at night.    . metoprolol tartrate (LOPRESSOR) 25 MG tablet Take 1 tablet (25 mg total) by mouth 2 (two) times daily. 60 tablet 2  . metroNIDAZOLE (METROCREAM) 0.75 % cream Apply 1 application topically 2 (two) times daily as needed (facial rosacea).     . mometasone (ELOCON) 0.1 % cream Apply 1 application topically  2 (two) times daily as needed (psoriasis).     . Omega-3 Fatty Acids (FISH OIL) 1000 MG CAPS Take 1,000 mg by mouth daily.     Marland Kitchen oxyCODONE-acetaminophen (PERCOCET/ROXICET) 5-325 MG tablet Take 1 tablet by mouth every 8 (eight) hours as needed for severe pain. 20 tablet 0  . pantoprazole (PROTONIX) 40 MG tablet 1 po 30 mins prior to first meal 30 tablet 11  . prochlorperazine (COMPAZINE) 10 MG tablet Take 1 tablet (10 mg total) by mouth every 6 (six) hours as needed for nausea or vomiting. 30 tablet 0  . senna-docusate (SENOKOT-S) 8.6-50 MG tablet Take 2 tablets by mouth at bedtime. 60 tablet 1  . traMADol  (ULTRAM) 50 MG tablet Take 50 mg by mouth every 4 (four) hours as needed for moderate pain. for pain    . triamcinolone lotion (KENALOG) 0.1 % Apply 1 application topically 4 (four) times daily. 120 mL 1  . umeclidinium-vilanterol (ANORO ELLIPTA) 62.5-25 MCG/INH AEPB Inhale 1 puff into the lungs daily. 30 each 0   No current facility-administered medications for this visit.    SURGICAL HISTORY:  Past Surgical History:  Procedure Laterality Date  . BACK SURGERY  1990  . BIOPSY  12/30/2018   Procedure: BIOPSY;  Surgeon: Danie Binder, MD;  [facility];  Service: Endoscopy;;  gastric  . CATARACT EXTRACTION Bilateral   . CERVICAL SPINE SURGERY  1995  . COLONOSCOPY WITH PROPOFOL N/A 12/30/2018   Procedure: COLONOSCOPY WITH PROPOFOL;  Surgeon: Danie Binder, MD;  [facility];  Service: Endoscopy;  Laterality: N/A;  12:30pm  . ESOPHAGOGASTRODUODENOSCOPY (EGD) WITH PROPOFOL N/A 12/30/2018   Procedure: ESOPHAGOGASTRODUODENOSCOPY (EGD) WITH PROPOFOL;  Surgeon: Danie Binder, MD;  [facility];  Service: Endoscopy;  Laterality: N/A;  . IR IMAGING GUIDED PORT INSERTION  09/04/2019  . POLYPECTOMY  12/30/2018   Procedure: POLYPECTOMY;  Surgeon: Danie Binder, MD;  [facility];  Service: Endoscopy;;  colon  . THORACENTESIS Right 08/08/2019   Procedure: Thoracentesis;  Surgeon: [name], MD;  Location: [facility];  Service: Pulmonary;  Laterality: Right;  Marland Kitchen VIDEO BRONCHOSCOPY Right 08/08/2019   Procedure: Video Bronchoscopy with Erbe Cryo Biopsy of Right mainstem;  Surgeon: [name], MD;  [facility];  Service: Pulmonary;  Laterality: Right;    REVIEW OF SYSTEMS:  A comprehensive review of systems was negative except for: Constitutional: positive for fatigue Respiratory: positive for dyspnea on exertion Integument/breast: positive for rash   PHYSICAL EXAMINATION: General appearance: alert, cooperative, fatigued and no distress Head:  Normocephalic, without obvious abnormality, atraumatic Neck: no adenopathy, no JVD, supple, symmetrical, trachea midline and thyroid not enlarged, symmetric, no tenderness/mass/nodules Lymph nodes: Cervical, supraclavicular, and axillary nodes normal. Resp: clear to auscultation bilaterally Back: symmetric, no curvature. ROM normal. No CVA tenderness. Cardio: regular rate and rhythm, S1, S2 normal, no murmur, click, rub or gallop GI: soft, non-tender; bowel sounds normal; no masses,  no organomegaly Extremities: extremities normal, atraumatic, no cyanosis or edema  ECOG PERFORMANCE STATUS: 1 - Symptomatic but completely ambulatory  Blood pressure 106/69, pulse 88, temperature (!) 97.2 F (36.2 C), temperature source Temporal, resp. rate 17, height _0  (1.88 m), weight 160 lb 6.4 oz (72.8 kg), SpO2 96 %.  LABORATORY DATA: Lab Results  Component Value Date   WBC 9.2 12/10/2019   HGB 8.1 (L) 12/10/2019   HCT 26.2 (L) 12/10/2019   MCV 90.7 12/10/2019   PLT 232 12/10/2019  Chemistry      Component Value Date/Time   NA 140 11/24/2019 0903   K 4.2 11/24/2019 0903   CL 100 11/24/2019 0903   CO2 28 11/24/2019 0903   BUN 15 11/24/2019 0903   CREATININE 0.80 11/24/2019 0903      Component Value Date/Time   CALCIUM 9.3 11/24/2019 0903   ALKPHOS 72 11/24/2019 0903   AST 11 (L) 11/24/2019 0903   ALT 13 11/24/2019 0903   BILITOT 0.3 11/24/2019 0903       RADIOGRAPHIC STUDIES: DG Chest 2 View  Result Date: 11/13/2019 CLINICAL DATA:  Chest tube placement EXAM: CHEST - 2 VIEW COMPARISON:  11/08/2019 FINDINGS: Interval placement of right-sided pigtail pleural drainage catheter with significant decrease in the size of the loculated right-sided pleural effusion. Stable positioning right-sided chest port. Heart size is stable. Hazy reticulonodular opacity within the right lung base with improved aeration from prior. Left lung is clear. No pneumothorax. IMPRESSION: 1. Interval placement  of right-sided pigtail pleural drainage catheter with significant decrease in size of the loculated right-sided pleural effusion. 2. Hazy reticulonodular opacity within the right lung base with improved aeration from prior. Electronically Signed   By: Davina Poke D.O.   On: 11/13/2019 14:51   DG Chest Port 1 View  Result Date: 11/17/2019 CLINICAL DATA:  Status post chest tube removal.  Pleural effusion EXAM: PORTABLE CHEST 1 VIEW COMPARISON:  November 17, 2019 FINDINGS: Chest tube has been removed on the right. Port-A-Cath tip is in the superior vena cava. No pneumothorax. There is loculated pleural effusion on the right with atelectasis in portions of the right mid and lower lung regions. Left lung is clear. Heart size and pulmonary vascularity are normal. No adenopathy. There is aortic atherosclerosis. No bone lesions. IMPRESSION: Removal of chest tube on the right without pneumothorax. Stable Port-A-Cath position. Loculated pleural effusion on the right with atelectatic change in the right mid lower lung zones. Left lung clear. No new opacity evident. Stable cardiac silhouette. Aortic Atherosclerosis (ICD10-I70.0). Electronically Signed   By: Lowella Grip III M.D.   On: 11/17/2019 10:16   DG Chest Port 1 View  Result Date: 11/17/2019 CLINICAL DATA:  Lung carcinoma and right empyema. EXAM: PORTABLE CHEST 1 VIEW COMPARISON:  11/16/2019 FINDINGS: Stable cardiac enlargement. Pigtail catheter remains in the right pleural space in stable position. Stable small amount of residual air in the lateral right pleural space with an area some residual pleural thickening/fluid. Improved aeration at both lung bases. No pulmonary edema. Stable positioning of Port-A-Cath. IMPRESSION: Stable small amount of residual air in the lateral right pleural space with an area residual pleural thickening/fluid. Electronically Signed   By: Aletta Edouard M.D.   On: 11/17/2019 07:46   DG CHEST PORT 1 VIEW  Result Date:  11/16/2019 CLINICAL DATA:  Lung carcinoma with cough and pneumonia EXAM: PORTABLE CHEST 1 VIEW COMPARISON:  November 15, 2019 FINDINGS: Port-A-Cath tip is in the superior vena cava. Pigtail catheter remains on the right. Small stable lateral right pneumothorax. There is persistent loculated pleural effusion on the right with areas of consolidation atelectasis throughout the right mid and lower lung zone regions. There is atelectatic change in the left base with equivocal left pleural effusion. Left lung otherwise clear. Heart size is normal. Pulmonary vascular is normal. No adenopathy. No bone lesions. IMPRESSION: Tube and catheter positions as described. Small lateral right pneumothorax is stable. Loculated pleural effusion on the right with areas of atelectasis and consolidation in the right  mid and lower lung zones are stable. Mild left base atelectasis with equivocal left pleural effusion. Stable cardiac silhouette. Appearance similar to 1 day prior. Electronically Signed   By: Lowella Grip III M.D.   On: 11/16/2019 11:48   DG CHEST PORT 1 VIEW  Result Date: 11/15/2019 CLINICAL DATA:  Pleural effusion EXAM: PORTABLE CHEST 1 VIEW COMPARISON:  11/14/2019 FINDINGS: Pigtail catheter is again identified on the right stable in appearance. Small residual pneumothorax is again seen. Underlying parenchymal opacity is again seen and stable. No significant increase in the degree of pleural fluid is noted. Right chest wall port is seen. Cardiac shadow is stable. Left lung remains clear. IMPRESSION: Stable chest stable appearance of the chest when compared with the previous day. Electronically Signed   By: Inez Catalina M.D.   On: 11/15/2019 15:09   DG CHEST PORT 1 VIEW  Result Date: 11/14/2019 CLINICAL DATA:  Chest tube in place. EXAM: PORTABLE CHEST 1 VIEW COMPARISON:  Radiograph yesterday.  CT 11/07/2019 FINDINGS: Right chest port remains in place. Right pigtail catheter in place. Partially loculated right pleural  effusion appears similar to yesterday, possible small partially loculated lateral pneumothorax. Persistent opacities throughout the right mid lower lung zone. Rightward mediastinal shift is unchanged. Left lung is clear. IMPRESSION: Right pigtail catheter remains in place with small volume of residual partially loculated pleural fluid. Possible small lateral loculated pneumothorax versus loculated fluid, recommend continued radiographic follow-up. Exam is otherwise unchanged from yesterday. Electronically Signed   By: Keith Rake M.D.   On: 11/14/2019 16:45   CT IMAGE GUIDED DRAINAGE BY PERCUTANEOUS CATHETER  Result Date: 11/11/2019 INDICATION: 74 year old male with large right-sided pleural effusion with enhancing pleura concerning for empyema. EXAM: CT-guided chest tube placement MEDICATIONS: The patient is currently admitted to the hospital and receiving intravenous antibiotics. The antibiotics were administered within an appropriate time frame prior to the initiation of the procedure. ANESTHESIA/SEDATION: 1 mg Versed COMPLICATIONS: None immediate. PROCEDURE: Informed written consent was obtained from the patient after a thorough discussion of the procedural risks, benefits and alternatives. All questions were addressed. A timeout was performed prior to the initiation of the procedure. An axial CT scan was performed. The large right-sided pleural effusion was identified. A suitable skin entry site was selected and marked. The overlying skin was sterilely prepped and draped in standard fashion using chlorhexidine skin prep. Local anesthesia was attained by infiltration with 1% lidocaine. A small dermatotomy was made. Using trocar technique, a 12 French drainage catheter was advanced into the pleural space and positioned superiorly within the pleural effusion. The drainage catheter was connected to low wall suction via a [employer] device at -20 cm H2O. The catheter was secured to the skin with 0 Prolene  suture. An air type dressing was applied. There was immediate aspiration of copious amounts of turbid pleural fluid. IMPRESSION: Successful placement of 12 French right-sided pigtail thoracostomy tube. Electronically Signed   By: Jacqulynn Cadet M.D.   On: 11/11/2019 09:04    ASSESSMENT AND PLAN: This is a very pleasant 74 years old white male with stage IIIc/IV (T4, N2, M0/M1 a) non-small cell lung cancer, squamous cell carcinoma presented with large necrotic mass extending to the carina with associated obstruction of the right lower lobe bronchus and postobstructive collapse/consolidation and necrosis in the right lower lobe as well as mediastinal lymphadenopathy with 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   The patient has PD-L1 expression of 20%. The patient completed a course  of palliative radiotherapy to the obstructive right lower lobe lung mass under the care of Dr. Sondra Come.  He is feeling a little bit better with less shortness of breath and chest pain. The patient is currently undergoing systemic chemotherapy with carboplatin and paclitaxel.  Status post 3 cycles.  His treatment with [name] was discontinued after cycle #1 secondary to significant skin rash.  He was treated with a tapered dose of prednisone. The patient is feeling much better today. I recommended for him to proceed with cycle #4 tomorrow as planned. He will come back for follow-up visit in 3 weeks for evaluation before starting cycle #5 of his treatment with carboplatin and paclitaxel. For pain management he will continue his current treatment with Percocet by his primary care physician and rheumatologist. For the chemotherapy-induced anemia, we will continue to monitor his hemoglobin and hematocrit closely and consider the patient for transfusion if needed. The patient was advised to call immediately if he has any concerning symptoms in the interval. The patient voices understanding of current  disease status and treatment options and is in agreement with the current care plan.  All questions were answered. The patient knows to call the clinic with any problems, questions or concerns. We can certainly see the patient much sooner if necessary.  Disclaimer: This note was dictated with voice recognition software. Similar sounding words can inadvertently be transcribed and may not be corrected upon review.

## 2019-12-11 ENCOUNTER — Inpatient Hospital Stay: Payer: Medicare Other

## 2019-12-11 ENCOUNTER — Telehealth: Payer: Self-pay | Admitting: Internal Medicine

## 2019-12-11 ENCOUNTER — Other Ambulatory Visit: Payer: Self-pay

## 2019-12-11 ENCOUNTER — Inpatient Hospital Stay: Payer: Medicare Other | Admitting: Nutrition

## 2019-12-11 VITALS — BP 119/78 | HR 82 | Temp 97.8°F | Resp 18

## 2019-12-11 DIAGNOSIS — Z5111 Encounter for antineoplastic chemotherapy: Secondary | ICD-10-CM | POA: Diagnosis not present

## 2019-12-11 DIAGNOSIS — C3431 Malignant neoplasm of lower lobe, right bronchus or lung: Secondary | ICD-10-CM | POA: Diagnosis not present

## 2019-12-11 DIAGNOSIS — Z5189 Encounter for other specified aftercare: Secondary | ICD-10-CM | POA: Diagnosis not present

## 2019-12-11 DIAGNOSIS — J910 Malignant pleural effusion: Secondary | ICD-10-CM | POA: Diagnosis not present

## 2019-12-11 DIAGNOSIS — D6481 Anemia due to antineoplastic chemotherapy: Secondary | ICD-10-CM | POA: Diagnosis not present

## 2019-12-11 DIAGNOSIS — T451X5A Adverse effect of antineoplastic and immunosuppressive drugs, initial encounter: Secondary | ICD-10-CM | POA: Diagnosis not present

## 2019-12-11 DIAGNOSIS — C3491 Malignant neoplasm of unspecified part of right bronchus or lung: Secondary | ICD-10-CM

## 2019-12-11 MED ORDER — SODIUM CHLORIDE 0.9% FLUSH
10.0000 mL | INTRAVENOUS | Status: DC | PRN
  Administered 2019-12-11: 10 mL
  Filled 2019-12-11: qty 10

## 2019-12-11 MED ORDER — FAMOTIDINE IN NACL 20-0.9 MG/50ML-% IV SOLN
INTRAVENOUS | Status: AC
  Filled 2019-12-11: qty 50

## 2019-12-11 MED ORDER — SODIUM CHLORIDE 0.9 % IV SOLN
10.0000 mg | Freq: Once | INTRAVENOUS | Status: AC
  Administered 2019-12-11: 10 mg via INTRAVENOUS
  Filled 2019-12-11: qty 10

## 2019-12-11 MED ORDER — PALONOSETRON HCL INJECTION 0.25 MG/5ML
INTRAVENOUS | Status: AC
  Filled 2019-12-11: qty 5

## 2019-12-11 MED ORDER — PALONOSETRON HCL INJECTION 0.25 MG/5ML
0.2500 mg | Freq: Once | INTRAVENOUS | Status: AC
  Administered 2019-12-11: 0.25 mg via INTRAVENOUS

## 2019-12-11 MED ORDER — HEPARIN SOD (PORK) LOCK FLUSH 100 UNIT/ML IV SOLN
500.0000 [IU] | Freq: Once | INTRAVENOUS | Status: AC | PRN
  Administered 2019-12-11: 500 [IU]
  Filled 2019-12-11: qty 5

## 2019-12-11 MED ORDER — DIPHENHYDRAMINE HCL 50 MG/ML IJ SOLN
INTRAMUSCULAR | Status: AC
  Filled 2019-12-11: qty 1

## 2019-12-11 MED ORDER — DIPHENHYDRAMINE HCL 50 MG/ML IJ SOLN
50.0000 mg | Freq: Once | INTRAMUSCULAR | Status: AC
  Administered 2019-12-11: 50 mg via INTRAVENOUS

## 2019-12-11 MED ORDER — SODIUM CHLORIDE 0.9 % IV SOLN
Freq: Once | INTRAVENOUS | Status: AC
  Filled 2019-12-11: qty 250

## 2019-12-11 MED ORDER — SODIUM CHLORIDE 0.9 % IV SOLN
461.5000 mg | Freq: Once | INTRAVENOUS | Status: AC
  Administered 2019-12-11: 460 mg via INTRAVENOUS
  Filled 2019-12-11: qty 46

## 2019-12-11 MED ORDER — FAMOTIDINE IN NACL 20-0.9 MG/50ML-% IV SOLN
20.0000 mg | Freq: Once | INTRAVENOUS | Status: AC
  Administered 2019-12-11: 20 mg via INTRAVENOUS

## 2019-12-11 MED ORDER — SODIUM CHLORIDE 0.9 % IV SOLN
175.0000 mg/m2 | Freq: Once | INTRAVENOUS | Status: AC
  Administered 2019-12-11: 348 mg via INTRAVENOUS
  Filled 2019-12-11: qty 58

## 2019-12-11 MED ORDER — SODIUM CHLORIDE 0.9 % IV SOLN
150.0000 mg | Freq: Once | INTRAVENOUS | Status: AC
  Administered 2019-12-11: 150 mg via INTRAVENOUS
  Filled 2019-12-11: qty 150

## 2019-12-11 NOTE — Patient Instructions (Signed)
[facility] Discharge Instructions for Patients Receiving Chemotherapy  Today you received the following chemotherapy agents: Taxol/Carboplatin.  To help prevent nausea and vomiting after your treatment, we encourage you to take your nausea medication as directed.   If you develop nausea and vomiting that is not controlled by your nausea medication, call the clinic.   BELOW ARE SYMPTOMS THAT SHOULD BE REPORTED IMMEDIATELY:  *FEVER GREATER THAN 100.5 F  *CHILLS WITH OR WITHOUT FEVER  NAUSEA AND VOMITING THAT IS NOT CONTROLLED WITH YOUR NAUSEA MEDICATION  *UNUSUAL SHORTNESS OF BREATH  *UNUSUAL BRUISING OR BLEEDING  TENDERNESS IN MOUTH AND THROAT WITH OR WITHOUT PRESENCE OF ULCERS  *URINARY PROBLEMS  *BOWEL PROBLEMS  UNUSUAL RASH Items with * indicate a potential emergency and should be followed up as soon as possible.  Feel free to call the clinic should you have any questions or concerns. The clinic phone number is [phone].  Please show the [place] at check-in to the Emergency Department and triage nurse.

## 2019-12-11 NOTE — Progress Notes (Signed)
Brief nutrition follow-up completed with patient receiving chemotherapy and radiation for lung cancer. Weight was documented as 160.4 pounds on July 7 decreased from 165.79 on June 15. Reviewed labs. Patient reports he does the best he can.  Reports his wife is nagging him all the time to try to eat more. He will drink chocolate Ensure on occasion. He verbalized no nutrition impact symptoms.  Nutrition diagnosis: Unintentional weight loss continues.  Intervention: Patient was encouraged to try to consume Ensure Enlive or Ensure Plus twice daily between meals. Provided samples of chocolate Ensure for patient. Teach back method used.  Monitoring, evaluation, goals: Patient will work to increase calories and protein to minimize weight loss.  Next visit: Wednesday, August 18 during infusion.  **Disclaimer: This note was dictated with voice recognition software. Similar sounding words can inadvertently be transcribed and this note may contain transcription errors which may not have been corrected upon publication of note.**

## 2019-12-11 NOTE — Telephone Encounter (Signed)
Scheduled per 7/7 los. Printed appt calendar for pt.

## 2019-12-12 DIAGNOSIS — J9621 Acute and chronic respiratory failure with hypoxia: Secondary | ICD-10-CM | POA: Diagnosis not present

## 2019-12-12 DIAGNOSIS — B952 Enterococcus as the cause of diseases classified elsewhere: Secondary | ICD-10-CM | POA: Diagnosis not present

## 2019-12-12 DIAGNOSIS — N390 Urinary tract infection, site not specified: Secondary | ICD-10-CM | POA: Diagnosis not present

## 2019-12-12 DIAGNOSIS — J440 Chronic obstructive pulmonary disease with acute lower respiratory infection: Secondary | ICD-10-CM | POA: Diagnosis not present

## 2019-12-12 DIAGNOSIS — C3491 Malignant neoplasm of unspecified part of right bronchus or lung: Secondary | ICD-10-CM | POA: Diagnosis not present

## 2019-12-12 DIAGNOSIS — J189 Pneumonia, unspecified organism: Secondary | ICD-10-CM | POA: Diagnosis not present

## 2019-12-13 ENCOUNTER — Inpatient Hospital Stay: Payer: Medicare Other

## 2019-12-13 ENCOUNTER — Other Ambulatory Visit: Payer: Self-pay

## 2019-12-13 VITALS — BP 106/71 | HR 78 | Temp 97.5°F | Resp 18

## 2019-12-13 DIAGNOSIS — C3431 Malignant neoplasm of lower lobe, right bronchus or lung: Secondary | ICD-10-CM | POA: Diagnosis not present

## 2019-12-13 DIAGNOSIS — Z5111 Encounter for antineoplastic chemotherapy: Secondary | ICD-10-CM | POA: Diagnosis not present

## 2019-12-13 DIAGNOSIS — C3491 Malignant neoplasm of unspecified part of right bronchus or lung: Secondary | ICD-10-CM

## 2019-12-13 DIAGNOSIS — D6481 Anemia due to antineoplastic chemotherapy: Secondary | ICD-10-CM | POA: Diagnosis not present

## 2019-12-13 DIAGNOSIS — T451X5A Adverse effect of antineoplastic and immunosuppressive drugs, initial encounter: Secondary | ICD-10-CM | POA: Diagnosis not present

## 2019-12-13 DIAGNOSIS — Z5189 Encounter for other specified aftercare: Secondary | ICD-10-CM | POA: Diagnosis not present

## 2019-12-13 DIAGNOSIS — J910 Malignant pleural effusion: Secondary | ICD-10-CM | POA: Diagnosis not present

## 2019-12-13 MED ORDER — PEGFILGRASTIM-JMDB 6 MG/0.6ML ~~LOC~~ SOSY
6.0000 mg | PREFILLED_SYRINGE | Freq: Once | SUBCUTANEOUS | Status: AC
  Administered 2019-12-13: 6 mg via SUBCUTANEOUS

## 2019-12-13 NOTE — Patient Instructions (Signed)

## 2019-12-17 ENCOUNTER — Telehealth: Payer: Self-pay | Admitting: Medical Oncology

## 2019-12-17 ENCOUNTER — Other Ambulatory Visit: Payer: Self-pay

## 2019-12-17 ENCOUNTER — Other Ambulatory Visit: Payer: Self-pay | Admitting: Medical Oncology

## 2019-12-17 ENCOUNTER — Inpatient Hospital Stay: Payer: Medicare Other

## 2019-12-17 DIAGNOSIS — Z5189 Encounter for other specified aftercare: Secondary | ICD-10-CM | POA: Diagnosis not present

## 2019-12-17 DIAGNOSIS — Z5111 Encounter for antineoplastic chemotherapy: Secondary | ICD-10-CM | POA: Diagnosis not present

## 2019-12-17 DIAGNOSIS — J910 Malignant pleural effusion: Secondary | ICD-10-CM | POA: Diagnosis not present

## 2019-12-17 DIAGNOSIS — C3491 Malignant neoplasm of unspecified part of right bronchus or lung: Secondary | ICD-10-CM

## 2019-12-17 DIAGNOSIS — D6481 Anemia due to antineoplastic chemotherapy: Secondary | ICD-10-CM | POA: Diagnosis not present

## 2019-12-17 DIAGNOSIS — D630 Anemia in neoplastic disease: Secondary | ICD-10-CM

## 2019-12-17 DIAGNOSIS — Z95828 Presence of other vascular implants and grafts: Secondary | ICD-10-CM

## 2019-12-17 DIAGNOSIS — T451X5A Adverse effect of antineoplastic and immunosuppressive drugs, initial encounter: Secondary | ICD-10-CM | POA: Diagnosis not present

## 2019-12-17 DIAGNOSIS — C3431 Malignant neoplasm of lower lobe, right bronchus or lung: Secondary | ICD-10-CM | POA: Diagnosis not present

## 2019-12-17 LAB — CBC WITH DIFFERENTIAL (CANCER CENTER ONLY)
Abs Immature Granulocytes: 0.52 10*3/uL — ABNORMAL HIGH (ref 0.00–0.07)
Basophils Absolute: 0.1 10*3/uL (ref 0.0–0.1)
Basophils Relative: 0 %
Eosinophils Absolute: 0.2 10*3/uL (ref 0.0–0.5)
Eosinophils Relative: 1 %
HCT: 24.2 % — ABNORMAL LOW (ref 39.0–52.0)
Hemoglobin: 7.5 g/dL — ABNORMAL LOW (ref 13.0–17.0)
Immature Granulocytes: 3 %
Lymphocytes Relative: 5 %
Lymphs Abs: 1.1 10*3/uL (ref 0.7–4.0)
MCH: 27.7 pg (ref 26.0–34.0)
MCHC: 31 g/dL (ref 30.0–36.0)
MCV: 89.3 fL (ref 80.0–100.0)
Monocytes Absolute: 0.5 10*3/uL (ref 0.1–1.0)
Monocytes Relative: 3 %
Neutro Abs: 18.4 10*3/uL — ABNORMAL HIGH (ref 1.7–7.7)
Neutrophils Relative %: 88 %
Platelet Count: 159 10*3/uL (ref 150–400)
RBC: 2.71 MIL/uL — ABNORMAL LOW (ref 4.22–5.81)
RDW: 18.2 % — ABNORMAL HIGH (ref 11.5–15.5)
WBC Count: 20.7 10*3/uL — ABNORMAL HIGH (ref 4.0–10.5)
nRBC: 0 % (ref 0.0–0.2)

## 2019-12-17 LAB — CMP (CANCER CENTER ONLY)
ALT: 13 U/L (ref 0–44)
AST: 12 U/L — ABNORMAL LOW (ref 15–41)
Albumin: 2.5 g/dL — ABNORMAL LOW (ref 3.5–5.0)
Alkaline Phosphatase: 131 U/L — ABNORMAL HIGH (ref 38–126)
Anion gap: 12 (ref 5–15)
BUN: 18 mg/dL (ref 8–23)
CO2: 27 mmol/L (ref 22–32)
Calcium: 9.5 mg/dL (ref 8.9–10.3)
Chloride: 100 mmol/L (ref 98–111)
Creatinine: 0.79 mg/dL (ref 0.61–1.24)
GFR, Est AFR Am: >60 mL/min (ref >60)
GFR, Estimated: >60 mL/min (ref >60)
Glucose, Bld: 181 mg/dL — ABNORMAL HIGH (ref 70–99)
Potassium: 4 mmol/L (ref 3.5–5.1)
Sodium: 139 mmol/L (ref 135–145)
Total Bilirubin: 0.3 mg/dL (ref 0.3–1.2)
Total Protein: 8.2 g/dL — ABNORMAL HIGH (ref 6.5–8.1)

## 2019-12-17 MED ORDER — HEPARIN SOD (PORK) LOCK FLUSH 100 UNIT/ML IV SOLN
500.0000 [IU] | Freq: Once | INTRAVENOUS | Status: AC | PRN
  Administered 2019-12-17: 500 [IU]
  Filled 2019-12-17: qty 5

## 2019-12-17 MED ORDER — SODIUM CHLORIDE 0.9% FLUSH
10.0000 mL | INTRAVENOUS | Status: DC | PRN
  Administered 2019-12-17: 10 mL
  Filled 2019-12-17: qty 10

## 2019-12-17 NOTE — Telephone Encounter (Signed)
Lab and tranfusion appt requested at [facility], LVM to return my call.

## 2019-12-18 ENCOUNTER — Other Ambulatory Visit (HOSPITAL_COMMUNITY): Payer: Self-pay | Admitting: *Deleted

## 2019-12-18 DIAGNOSIS — C3491 Malignant neoplasm of unspecified part of right bronchus or lung: Secondary | ICD-10-CM

## 2019-12-18 DIAGNOSIS — D630 Anemia in neoplastic disease: Secondary | ICD-10-CM

## 2019-12-19 ENCOUNTER — Inpatient Hospital Stay (HOSPITAL_COMMUNITY): Payer: Medicare Other

## 2019-12-19 ENCOUNTER — Inpatient Hospital Stay (HOSPITAL_COMMUNITY): Payer: Medicare Other | Attending: Hematology

## 2019-12-19 ENCOUNTER — Other Ambulatory Visit: Payer: Self-pay

## 2019-12-19 ENCOUNTER — Other Ambulatory Visit (HOSPITAL_COMMUNITY): Payer: Self-pay

## 2019-12-19 ENCOUNTER — Encounter (HOSPITAL_COMMUNITY): Payer: Self-pay

## 2019-12-19 VITALS — BP 123/74 | HR 88 | Temp 97.5°F | Resp 18

## 2019-12-19 DIAGNOSIS — C3431 Malignant neoplasm of lower lobe, right bronchus or lung: Secondary | ICD-10-CM | POA: Insufficient documentation

## 2019-12-19 DIAGNOSIS — D630 Anemia in neoplastic disease: Secondary | ICD-10-CM

## 2019-12-19 DIAGNOSIS — Z5189 Encounter for other specified aftercare: Secondary | ICD-10-CM | POA: Insufficient documentation

## 2019-12-19 DIAGNOSIS — C3491 Malignant neoplasm of unspecified part of right bronchus or lung: Secondary | ICD-10-CM

## 2019-12-19 LAB — CBC WITH DIFFERENTIAL/PLATELET
Abs Immature Granulocytes: 0.46 10*3/uL — ABNORMAL HIGH (ref 0.00–0.07)
Basophils Absolute: 0.1 10*3/uL (ref 0.0–0.1)
Basophils Relative: 0 %
Eosinophils Absolute: 0.3 10*3/uL (ref 0.0–0.5)
Eosinophils Relative: 2 %
HCT: 26.1 % — ABNORMAL LOW (ref 39.0–52.0)
Hemoglobin: 7.7 g/dL — ABNORMAL LOW (ref 13.0–17.0)
Immature Granulocytes: 3 %
Lymphocytes Relative: 7 %
Lymphs Abs: 1 10*3/uL (ref 0.7–4.0)
MCH: 27.7 pg (ref 26.0–34.0)
MCHC: 29.5 g/dL — ABNORMAL LOW (ref 30.0–36.0)
MCV: 93.9 fL (ref 80.0–100.0)
Monocytes Absolute: 0.9 10*3/uL (ref 0.1–1.0)
Monocytes Relative: 5 %
Neutro Abs: 13.1 10*3/uL — ABNORMAL HIGH (ref 1.7–7.7)
Neutrophils Relative %: 83 %
Platelets: 167 10*3/uL (ref 150–400)
RBC: 2.78 MIL/uL — ABNORMAL LOW (ref 4.22–5.81)
RDW: 18.3 % — ABNORMAL HIGH (ref 11.5–15.5)
WBC Morphology: INCREASED
WBC: 15.8 10*3/uL — ABNORMAL HIGH (ref 4.0–10.5)
nRBC: 0 % (ref 0.0–0.2)

## 2019-12-19 LAB — PREPARE RBC (CROSSMATCH)

## 2019-12-19 MED ORDER — SODIUM CHLORIDE 0.9% IV SOLUTION
250.0000 mL | Freq: Once | INTRAVENOUS | Status: AC
  Administered 2019-12-19: 250 mL via INTRAVENOUS

## 2019-12-19 MED ORDER — ACETAMINOPHEN 325 MG PO TABS
650.0000 mg | ORAL_TABLET | Freq: Once | ORAL | Status: AC
  Administered 2019-12-19: 650 mg via ORAL
  Filled 2019-12-19: qty 2

## 2019-12-19 MED ORDER — HEPARIN SOD (PORK) LOCK FLUSH 100 UNIT/ML IV SOLN
500.0000 [IU] | Freq: Every day | INTRAVENOUS | Status: AC | PRN
  Administered 2019-12-19: 500 [IU]

## 2019-12-19 MED ORDER — DIPHENHYDRAMINE HCL 25 MG PO CAPS
25.0000 mg | ORAL_CAPSULE | Freq: Once | ORAL | Status: AC
  Administered 2019-12-19: 25 mg via ORAL
  Filled 2019-12-19: qty 1

## 2019-12-19 MED ORDER — SODIUM CHLORIDE 0.9% FLUSH
10.0000 mL | INTRAVENOUS | Status: AC | PRN
  Administered 2019-12-19: 10 mL

## 2019-12-19 NOTE — Patient Instructions (Signed)
[facility]  Discharge Instructions:   _______________________________________________________________  Thank you for choosing [facility] at [facility] to provide your oncology and hematology care.  To afford each patient quality time with our providers, please arrive at least 15 minutes before your scheduled appointment.  You need to re-schedule your appointment if you arrive 10 or more minutes late.  We strive to give you quality time with our providers, and arriving late affects you and other patients whose appointments are after yours.  Also, if you no show three or more times for appointments you may be dismissed from the clinic.  Again, thank you for choosing [facility]. Our hope is that these requests will allow you access to exceptional care and in a timely manner. _______________________________________________________________  If you have questions after your visit, please contact our office at [phone] between the hours of 8:30 a.m. and 5:00 p.m. Voicemails left after 4:30 p.m. will not be returned until the following business day. _______________________________________________________________  For prescription refill requests, have your pharmacy contact our office. _______________________________________________________________  Recommendations made by the consultant and any test results will be sent to your referring physician. _______________________________________________________________ 

## 2019-12-19 NOTE — Progress Notes (Signed)
Patient presents today for 2 Units of PRBC's. Vital signs are stable. Patient has no complaints of pain today. Patient denies shortness of breath and dizziness. Patient has complaints of extreme fatigue not relieved with resting.   Treatment given today per MD orders. Tolerated infusion without adverse affects. Vital signs stable. No complaints at this time. Discharged from clinic via wheel chair. F/U with [facility] as scheduled.

## 2019-12-20 LAB — BPAM RBC
Blood Product Expiration Date: 202108082359
Blood Product Expiration Date: 202108092359
ISSUE DATE / TIME: 202107160949
ISSUE DATE / TIME: 202107161137
Unit Type and Rh: 6200
Unit Type and Rh: 6200

## 2019-12-20 LAB — TYPE AND SCREEN
ABO/RH(D): A POS
Antibody Screen: NEGATIVE
Unit division: 0
Unit division: 0

## 2019-12-22 DIAGNOSIS — C3491 Malignant neoplasm of unspecified part of right bronchus or lung: Secondary | ICD-10-CM | POA: Diagnosis not present

## 2019-12-22 DIAGNOSIS — J440 Chronic obstructive pulmonary disease with acute lower respiratory infection: Secondary | ICD-10-CM | POA: Diagnosis not present

## 2019-12-22 DIAGNOSIS — J9621 Acute and chronic respiratory failure with hypoxia: Secondary | ICD-10-CM | POA: Diagnosis not present

## 2019-12-22 DIAGNOSIS — N390 Urinary tract infection, site not specified: Secondary | ICD-10-CM | POA: Diagnosis not present

## 2019-12-22 DIAGNOSIS — B952 Enterococcus as the cause of diseases classified elsewhere: Secondary | ICD-10-CM | POA: Diagnosis not present

## 2019-12-22 DIAGNOSIS — J189 Pneumonia, unspecified organism: Secondary | ICD-10-CM | POA: Diagnosis not present

## 2019-12-24 ENCOUNTER — Other Ambulatory Visit: Payer: Self-pay

## 2019-12-24 ENCOUNTER — Inpatient Hospital Stay: Payer: Medicare Other

## 2019-12-24 DIAGNOSIS — Z95828 Presence of other vascular implants and grafts: Secondary | ICD-10-CM

## 2019-12-24 DIAGNOSIS — D6481 Anemia due to antineoplastic chemotherapy: Secondary | ICD-10-CM | POA: Diagnosis not present

## 2019-12-24 DIAGNOSIS — J910 Malignant pleural effusion: Secondary | ICD-10-CM | POA: Diagnosis not present

## 2019-12-24 DIAGNOSIS — N390 Urinary tract infection, site not specified: Secondary | ICD-10-CM | POA: Diagnosis not present

## 2019-12-24 DIAGNOSIS — C3491 Malignant neoplasm of unspecified part of right bronchus or lung: Secondary | ICD-10-CM

## 2019-12-24 DIAGNOSIS — Z5189 Encounter for other specified aftercare: Secondary | ICD-10-CM | POA: Diagnosis not present

## 2019-12-24 DIAGNOSIS — Z5111 Encounter for antineoplastic chemotherapy: Secondary | ICD-10-CM | POA: Diagnosis not present

## 2019-12-24 DIAGNOSIS — J9621 Acute and chronic respiratory failure with hypoxia: Secondary | ICD-10-CM | POA: Diagnosis not present

## 2019-12-24 DIAGNOSIS — J440 Chronic obstructive pulmonary disease with acute lower respiratory infection: Secondary | ICD-10-CM | POA: Diagnosis not present

## 2019-12-24 DIAGNOSIS — C3431 Malignant neoplasm of lower lobe, right bronchus or lung: Secondary | ICD-10-CM | POA: Diagnosis not present

## 2019-12-24 DIAGNOSIS — J189 Pneumonia, unspecified organism: Secondary | ICD-10-CM | POA: Diagnosis not present

## 2019-12-24 DIAGNOSIS — B952 Enterococcus as the cause of diseases classified elsewhere: Secondary | ICD-10-CM | POA: Diagnosis not present

## 2019-12-24 DIAGNOSIS — T451X5A Adverse effect of antineoplastic and immunosuppressive drugs, initial encounter: Secondary | ICD-10-CM | POA: Diagnosis not present

## 2019-12-24 LAB — CBC WITH DIFFERENTIAL (CANCER CENTER ONLY)
Abs Immature Granulocytes: 0.15 10*3/uL — ABNORMAL HIGH (ref 0.00–0.07)
Basophils Absolute: 0 10*3/uL (ref 0.0–0.1)
Basophils Relative: 0 %
Eosinophils Absolute: 0.1 10*3/uL (ref 0.0–0.5)
Eosinophils Relative: 1 %
HCT: 30.2 % — ABNORMAL LOW (ref 39.0–52.0)
Hemoglobin: 9.4 g/dL — ABNORMAL LOW (ref 13.0–17.0)
Immature Granulocytes: 1 %
Lymphocytes Relative: 7 %
Lymphs Abs: 0.9 10*3/uL (ref 0.7–4.0)
MCH: 28.5 pg (ref 26.0–34.0)
MCHC: 31.1 g/dL (ref 30.0–36.0)
MCV: 91.5 fL (ref 80.0–100.0)
Monocytes Absolute: 0.5 10*3/uL (ref 0.1–1.0)
Monocytes Relative: 4 %
Neutro Abs: 10.3 10*3/uL — ABNORMAL HIGH (ref 1.7–7.7)
Neutrophils Relative %: 87 %
Platelet Count: 108 10*3/uL — ABNORMAL LOW (ref 150–400)
RBC: 3.3 MIL/uL — ABNORMAL LOW (ref 4.22–5.81)
RDW: 17.5 % — ABNORMAL HIGH (ref 11.5–15.5)
WBC Count: 11.9 10*3/uL — ABNORMAL HIGH (ref 4.0–10.5)
nRBC: 0 % (ref 0.0–0.2)

## 2019-12-24 LAB — CMP (CANCER CENTER ONLY)
ALT: 11 U/L (ref 0–44)
AST: 8 U/L — ABNORMAL LOW (ref 15–41)
Albumin: 2.5 g/dL — ABNORMAL LOW (ref 3.5–5.0)
Alkaline Phosphatase: 112 U/L (ref 38–126)
Anion gap: 9 (ref 5–15)
BUN: 16 mg/dL (ref 8–23)
CO2: 27 mmol/L (ref 22–32)
Calcium: 9.6 mg/dL (ref 8.9–10.3)
Chloride: 102 mmol/L (ref 98–111)
Creatinine: 0.74 mg/dL (ref 0.61–1.24)
GFR, Est AFR Am: >60 mL/min (ref >60)
GFR, Estimated: >60 mL/min (ref >60)
Glucose, Bld: 177 mg/dL — ABNORMAL HIGH (ref 70–99)
Potassium: 4 mmol/L (ref 3.5–5.1)
Sodium: 138 mmol/L (ref 135–145)
Total Bilirubin: 0.3 mg/dL (ref 0.3–1.2)
Total Protein: 8 g/dL (ref 6.5–8.1)

## 2019-12-24 MED ORDER — HEPARIN SOD (PORK) LOCK FLUSH 100 UNIT/ML IV SOLN
500.0000 [IU] | Freq: Once | INTRAVENOUS | Status: AC | PRN
  Administered 2019-12-24: 500 [IU]
  Filled 2019-12-24: qty 5

## 2019-12-24 MED ORDER — SODIUM CHLORIDE 0.9% FLUSH
10.0000 mL | INTRAVENOUS | Status: DC | PRN
  Administered 2019-12-24: 10 mL
  Filled 2019-12-24: qty 10

## 2019-12-30 ENCOUNTER — Other Ambulatory Visit: Payer: Self-pay | Admitting: Internal Medicine

## 2019-12-30 DIAGNOSIS — C3491 Malignant neoplasm of unspecified part of right bronchus or lung: Secondary | ICD-10-CM

## 2019-12-30 MED FILL — Fosaprepitant Dimeglumine For IV Infusion 150 MG (Base Eq): INTRAVENOUS | Qty: 5 | Status: AC

## 2019-12-30 MED FILL — Dexamethasone Sodium Phosphate Inj 100 MG/10ML: INTRAMUSCULAR | Qty: 1 | Status: AC

## 2019-12-31 ENCOUNTER — Encounter: Payer: Self-pay | Admitting: Internal Medicine

## 2019-12-31 ENCOUNTER — Inpatient Hospital Stay: Payer: Medicare Other

## 2019-12-31 ENCOUNTER — Inpatient Hospital Stay (HOSPITAL_BASED_OUTPATIENT_CLINIC_OR_DEPARTMENT_OTHER): Payer: Medicare Other | Admitting: Internal Medicine

## 2019-12-31 ENCOUNTER — Other Ambulatory Visit: Payer: Self-pay

## 2019-12-31 ENCOUNTER — Telehealth: Payer: Self-pay | Admitting: *Deleted

## 2019-12-31 VITALS — BP 113/78 | HR 89 | Temp 97.9°F | Resp 18 | Ht 74.0 in | Wt 163.3 lb

## 2019-12-31 DIAGNOSIS — Z5111 Encounter for antineoplastic chemotherapy: Secondary | ICD-10-CM

## 2019-12-31 DIAGNOSIS — C3491 Malignant neoplasm of unspecified part of right bronchus or lung: Secondary | ICD-10-CM

## 2019-12-31 DIAGNOSIS — D630 Anemia in neoplastic disease: Secondary | ICD-10-CM | POA: Diagnosis not present

## 2019-12-31 DIAGNOSIS — J910 Malignant pleural effusion: Secondary | ICD-10-CM | POA: Diagnosis not present

## 2019-12-31 DIAGNOSIS — D6481 Anemia due to antineoplastic chemotherapy: Secondary | ICD-10-CM | POA: Diagnosis not present

## 2019-12-31 DIAGNOSIS — I10 Essential (primary) hypertension: Secondary | ICD-10-CM | POA: Diagnosis not present

## 2019-12-31 DIAGNOSIS — Z95828 Presence of other vascular implants and grafts: Secondary | ICD-10-CM

## 2019-12-31 DIAGNOSIS — Z5189 Encounter for other specified aftercare: Secondary | ICD-10-CM | POA: Diagnosis not present

## 2019-12-31 DIAGNOSIS — C3431 Malignant neoplasm of lower lobe, right bronchus or lung: Secondary | ICD-10-CM | POA: Diagnosis not present

## 2019-12-31 DIAGNOSIS — R5382 Chronic fatigue, unspecified: Secondary | ICD-10-CM

## 2019-12-31 DIAGNOSIS — T451X5A Adverse effect of antineoplastic and immunosuppressive drugs, initial encounter: Secondary | ICD-10-CM | POA: Diagnosis not present

## 2019-12-31 LAB — CBC WITH DIFFERENTIAL (CANCER CENTER ONLY)
Abs Immature Granulocytes: 0.12 10*3/uL — ABNORMAL HIGH (ref 0.00–0.07)
Basophils Absolute: 0 10*3/uL (ref 0.0–0.1)
Basophils Relative: 0 %
Eosinophils Absolute: 0.1 10*3/uL (ref 0.0–0.5)
Eosinophils Relative: 1 %
HCT: 29 % — ABNORMAL LOW (ref 39.0–52.0)
Hemoglobin: 9.1 g/dL — ABNORMAL LOW (ref 13.0–17.0)
Immature Granulocytes: 1 %
Lymphocytes Relative: 9 %
Lymphs Abs: 1 10*3/uL (ref 0.7–4.0)
MCH: 28.2 pg (ref 26.0–34.0)
MCHC: 31.4 g/dL (ref 30.0–36.0)
MCV: 89.8 fL (ref 80.0–100.0)
Monocytes Absolute: 0.8 10*3/uL (ref 0.1–1.0)
Monocytes Relative: 8 %
Neutro Abs: 8.4 10*3/uL — ABNORMAL HIGH (ref 1.7–7.7)
Neutrophils Relative %: 81 %
Platelet Count: 171 10*3/uL (ref 150–400)
RBC: 3.23 MIL/uL — ABNORMAL LOW (ref 4.22–5.81)
RDW: 17.5 % — ABNORMAL HIGH (ref 11.5–15.5)
WBC Count: 10.4 10*3/uL (ref 4.0–10.5)
nRBC: 0 % (ref 0.0–0.2)

## 2019-12-31 LAB — CMP (CANCER CENTER ONLY)
ALT: 11 U/L (ref 0–44)
AST: 11 U/L — ABNORMAL LOW (ref 15–41)
Albumin: 2.7 g/dL — ABNORMAL LOW (ref 3.5–5.0)
Alkaline Phosphatase: 93 U/L (ref 38–126)
Anion gap: 9 (ref 5–15)
BUN: 20 mg/dL (ref 8–23)
CO2: 30 mmol/L (ref 22–32)
Calcium: 9.9 mg/dL (ref 8.9–10.3)
Chloride: 100 mmol/L (ref 98–111)
Creatinine: 0.72 mg/dL (ref 0.61–1.24)
GFR, Est AFR Am: >60 mL/min (ref >60)
GFR, Estimated: >60 mL/min (ref >60)
Glucose, Bld: 105 mg/dL — ABNORMAL HIGH (ref 70–99)
Potassium: 4.3 mmol/L (ref 3.5–5.1)
Sodium: 139 mmol/L (ref 135–145)
Total Bilirubin: 0.4 mg/dL (ref 0.3–1.2)
Total Protein: 8.4 g/dL — ABNORMAL HIGH (ref 6.5–8.1)

## 2019-12-31 LAB — TSH: TSH: 1.711 u[IU]/mL (ref 0.320–4.118)

## 2019-12-31 MED ORDER — DIPHENHYDRAMINE HCL 50 MG/ML IJ SOLN
50.0000 mg | Freq: Once | INTRAMUSCULAR | Status: AC
  Administered 2019-12-31: 50 mg via INTRAVENOUS

## 2019-12-31 MED ORDER — SODIUM CHLORIDE 0.9% FLUSH
10.0000 mL | INTRAVENOUS | Status: DC | PRN
  Administered 2019-12-31: 10 mL
  Filled 2019-12-31: qty 10

## 2019-12-31 MED ORDER — SODIUM CHLORIDE 0.9 % IV SOLN
150.0000 mg | Freq: Once | INTRAVENOUS | Status: AC
  Administered 2019-12-31: 150 mg via INTRAVENOUS
  Filled 2019-12-31: qty 150

## 2019-12-31 MED ORDER — SODIUM CHLORIDE 0.9 % IV SOLN
Freq: Once | INTRAVENOUS | Status: AC
  Filled 2019-12-31: qty 250

## 2019-12-31 MED ORDER — HEPARIN SOD (PORK) LOCK FLUSH 100 UNIT/ML IV SOLN
500.0000 [IU] | Freq: Once | INTRAVENOUS | Status: AC | PRN
  Administered 2019-12-31: 500 [IU]
  Filled 2019-12-31: qty 5

## 2019-12-31 MED ORDER — SODIUM CHLORIDE 0.9 % IV SOLN
10.0000 mg | Freq: Once | INTRAVENOUS | Status: AC
  Administered 2019-12-31: 10 mg via INTRAVENOUS
  Filled 2019-12-31: qty 10

## 2019-12-31 MED ORDER — FAMOTIDINE IN NACL 20-0.9 MG/50ML-% IV SOLN
20.0000 mg | Freq: Once | INTRAVENOUS | Status: AC
  Administered 2019-12-31: 20 mg via INTRAVENOUS

## 2019-12-31 MED ORDER — SODIUM CHLORIDE 0.9 % IV SOLN
175.0000 mg/m2 | Freq: Once | INTRAVENOUS | Status: AC
  Administered 2019-12-31: 348 mg via INTRAVENOUS
  Filled 2019-12-31: qty 58

## 2019-12-31 MED ORDER — PALONOSETRON HCL INJECTION 0.25 MG/5ML
INTRAVENOUS | Status: AC
  Filled 2019-12-31: qty 5

## 2019-12-31 MED ORDER — FAMOTIDINE IN NACL 20-0.9 MG/50ML-% IV SOLN
INTRAVENOUS | Status: AC
  Filled 2019-12-31: qty 50

## 2019-12-31 MED ORDER — SODIUM CHLORIDE 0.9 % IV SOLN
461.5000 mg | Freq: Once | INTRAVENOUS | Status: AC
  Administered 2019-12-31: 460 mg via INTRAVENOUS
  Filled 2019-12-31: qty 46

## 2019-12-31 MED ORDER — PALONOSETRON HCL INJECTION 0.25 MG/5ML
0.2500 mg | Freq: Once | INTRAVENOUS | Status: AC
  Administered 2019-12-31: 0.25 mg via INTRAVENOUS

## 2019-12-31 MED ORDER — DIPHENHYDRAMINE HCL 50 MG/ML IJ SOLN
INTRAMUSCULAR | Status: AC
  Filled 2019-12-31: qty 1

## 2019-12-31 NOTE — Patient Instructions (Signed)

## 2019-12-31 NOTE — Progress Notes (Signed)
[facility] [phone]   [phone]  OFFICE PROGRESS NOTE  [name], MD [address] [place] [address]  DIAGNOSIS: Stage IIIC/IV (T4, N2, M0/M1a) non-small cell lung cancer, squamous cell carcinoma with potential malignant right pleural effusion.  PD-L1 expression 20%.  PRIOR THERAPY: Palliative radiotherapy under the care of Dr. Sondra Come to the obstructive right lower lobe lung mass.  CURRENT THERAPY: Systemic chemotherapy with carboplatin for AUC of 5, paclitaxel 175 mg/M2 and Keytruda 200 mg IV every 3 weeks with Neulasta support.  First dose September 02, 2019.  Status post 4 cycles  INTERVAL HISTORY: Jeremy [name] 74 y.o. male returns to the clinic today for follow-up visit accompanied by his wife.  The patient is feeling fine today with no concerning complaints except for the fatigue as well as the aching pain after the Neulasta injection. He also has mild neuropathy in the finger and toes.  He has few more spots of skin rash but no itching.  The patient has no weight loss or night sweats.  He has no chest pain, shortness of breath, cough or hemoptysis.  He denied having any fever or chills.  He is here today for evaluation before starting cycle #5 of his treatment.  MEDICAL HISTORY: Past Medical History:  Diagnosis Date   Cancer ([place])    mass right lung   COPD (chronic obstructive pulmonary disease) ([place] [facility])    QUIT SMOKING 30 YRS AGO   Diabetes ([place]) 2018   HTN (hypertension)    Psoriatic arthritis ([facility]) 2015    ALLERGIES:  is allergic to [place] [pembrolizumab], chlorhexidine, and aleve [naproxen sodium].  MEDICATIONS:  Current Outpatient Medications  Medication Sig Dispense Refill   acetaminophen (TYLENOL) 325 MG tablet Take 2 tablets (650 mg total) by mouth every 6 (six) hours as needed for mild pain, fever or headache. 120 tablet 0   albuterol (VENTOLIN HFA) 108 (90 Base) MCG/ACT inhaler Inhale 2 puffs into the lungs  every 6 (six) hours as needed for wheezing or shortness of breath.     cyclobenzaprine (FLEXERIL) 10 MG tablet      diltiazem (CARDIZEM CD) 360 MG 24 hr capsule Take 1 capsule (360 mg total) by mouth daily. 30 capsule 3   doxycycline (MONODOX) 50 MG capsule Take 50 mg by mouth 2 (two) times daily as needed (rosacea flare ups.).     ferrous sulfate 325 (65 FE) MG tablet Take 325 mg by mouth daily with supper.     FLOVENT HFA 110 MCG/ACT inhaler Inhale 1 puff into the lungs 2 (two) times daily.      folic acid (FOLVITE) 1 MG tablet Take 1 mg by mouth daily.     gabapentin (NEURONTIN) 300 MG capsule Take 600 mg by mouth at bedtime.     lidocaine-prilocaine (EMLA) cream Apply to the Port-A-Cath site 30 minutes before treatment 30 g 0   loratadine (CLARITIN) 10 MG tablet Take 10 mg by mouth daily.     metFORMIN (GLUCOPHAGE-XR) 500 MG 24 hr tablet Take 500-1,000 mg by mouth See admin instructions. Take 2 tablets (1000 mg) by mouth in the morning & 1 tablet (500 mg) by mouth at night.     metoprolol tartrate (LOPRESSOR) 25 MG tablet Take 1 tablet (25 mg total) by mouth 2 (two) times daily. 60 tablet 2   metroNIDAZOLE (METROCREAM) 0.75 % cream Apply 1 application topically 2 (two) times daily as needed (facial rosacea).  mometasone (ELOCON) 0.1 % cream Apply 1 application topically 2 (two) times daily as needed (psoriasis).      Omega-3 Fatty Acids (FISH OIL) 1000 MG CAPS Take 1,000 mg by mouth daily.      oxyCODONE-acetaminophen (PERCOCET/ROXICET) 5-325 MG tablet Take 1 tablet by mouth every 8 (eight) hours as needed for severe pain. 20 tablet 0   pantoprazole (PROTONIX) 40 MG tablet 1 po 30 mins prior to first meal 30 tablet 11   prochlorperazine (COMPAZINE) 10 MG tablet Take 1 tablet (10 mg total) by mouth every 6 (six) hours as needed for nausea or vomiting. 30 tablet 0   senna-docusate (SENOKOT-S) 8.6-50 MG tablet Take 2 tablets by mouth at bedtime. 60 tablet 1   simvastatin  (ZOCOR) 40 MG tablet      traMADol (ULTRAM) 50 MG tablet Take 50 mg by mouth every 4 (four) hours as needed for moderate pain. for pain     triamcinolone lotion (KENALOG) 0.1 % Apply 1 application topically 4 (four) times daily. 120 mL 1   umeclidinium-vilanterol (ANORO ELLIPTA) 62.5-25 MCG/INH AEPB Inhale 1 puff into the lungs daily. 30 each 0   No current facility-administered medications for this visit.    SURGICAL HISTORY:  Past Surgical History:  Procedure Laterality Date   BACK SURGERY  1990   BIOPSY  12/30/2018   Procedure: BIOPSY;  Surgeon: Danie Binder, MD;  [facility];  Service: Endoscopy;;  gastric   CATARACT EXTRACTION Bilateral    CERVICAL SPINE SURGERY  1995   COLONOSCOPY WITH PROPOFOL N/A 12/30/2018   Procedure: COLONOSCOPY WITH PROPOFOL;  Surgeon: Danie Binder, MD;  [facility];  Service: Endoscopy;  Laterality: N/A;  12:30pm   ESOPHAGOGASTRODUODENOSCOPY (EGD) WITH PROPOFOL N/A 12/30/2018   Procedure: ESOPHAGOGASTRODUODENOSCOPY (EGD) WITH PROPOFOL;  Surgeon: Danie Binder, MD;  [facility];  Service: Endoscopy;  Laterality: N/A;   IR IMAGING GUIDED PORT INSERTION  09/04/2019   POLYPECTOMY  12/30/2018   Procedure: POLYPECTOMY;  Surgeon: Danie Binder, MD;  [facility];  Service: Endoscopy;;  colon   THORACENTESIS Right 08/08/2019   Procedure: Thoracentesis;  Surgeon: [name], MD;  Location: [facility];  Service: Pulmonary;  Laterality: Right;   VIDEO BRONCHOSCOPY Right 08/08/2019   Procedure: Video Bronchoscopy with Erbe Cryo Biopsy of Right mainstem;  Surgeon: [name], MD;  Location: [facility] OR;  Service: Pulmonary;  Laterality: Right;    REVIEW OF SYSTEMS:  A comprehensive review of systems was negative except for: Constitutional: positive for fatigue Respiratory: positive for cough Integument/breast: positive for rash Neurological: positive for paresthesia   PHYSICAL EXAMINATION: General  appearance: alert, cooperative, fatigued and no distress Head: Normocephalic, without obvious abnormality, atraumatic Neck: no adenopathy, no JVD, supple, symmetrical, trachea midline and thyroid not enlarged, symmetric, no tenderness/mass/nodules Lymph nodes: Cervical, supraclavicular, and axillary nodes normal. Resp: clear to auscultation bilaterally Back: symmetric, no curvature. ROM normal. No CVA tenderness. Cardio: regular rate and rhythm, S1, S2 normal, no murmur, click, rub or gallop GI: soft, non-tender; bowel sounds normal; no masses,  no organomegaly Extremities: extremities normal, atraumatic, no cyanosis or edema  ECOG PERFORMANCE STATUS: 1 - Symptomatic but completely ambulatory  Blood pressure 113/78, pulse 89, temperature 97.9 F (36.6 C), temperature source Temporal, resp. rate 18, height '6\' 2"'  (1.88 m), weight 163 lb 4.8 oz (74.1 kg), SpO2 96 %.  LABORATORY DATA: Lab Results  Component Value Date   WBC 10.4 12/31/2019  HGB 9.1 (L) 12/31/2019   HCT 29.0 (L) 12/31/2019   MCV 89.8 12/31/2019   PLT 171 12/31/2019      Chemistry      Component Value Date/Time   NA 139 12/31/2019 0951   K 4.3 12/31/2019 0951   CL 100 12/31/2019 0951   CO2 30 12/31/2019 0951   BUN 20 12/31/2019 0951   CREATININE 0.72 12/31/2019 0951      Component Value Date/Time   CALCIUM 9.9 12/31/2019 0951   ALKPHOS 93 12/31/2019 0951   AST 11 (L) 12/31/2019 0951   ALT 11 12/31/2019 0951   BILITOT 0.4 12/31/2019 0951       RADIOGRAPHIC STUDIES: No results found.  ASSESSMENT AND PLAN: This is a very pleasant 74 years old white male with stage IIIc/IV (T4, N2, M0/M1 a) non-small cell lung cancer, squamous cell carcinoma presented with large necrotic mass extending to the carina with associated obstruction of the right lower lobe bronchus and postobstructive collapse/consolidation and necrosis in the right lower lobe as well as mediastinal lymphadenopathy with potential malignant right  pleural effusion diagnosed in March 2021.   The patient has PD-L1 expression of 20%. The patient completed a course of palliative radiotherapy to the obstructive right lower lobe lung mass under the care of Dr. Sondra Come.  He is feeling a little bit better with less shortness of breath and chest pain. The patient is currently undergoing systemic chemotherapy with carboplatin and paclitaxel.  Status post 4 cycles.  His treatment with [name] was discontinued after cycle #1 secondary to significant skin rash.  He was treated with a tapered dose of prednisone. The patient tolerated the last cycle of his treatment well except for the fatigue as well as the peripheral neuropathy and a lot of aching pain after the Neulasta injection. I recommended for him to proceed with cycle #5 today as planned. For the aching pain and pain management he will continue on Percocet. For the chemotherapy-induced anemia, we will continue to monitor his hemoglobin and hematocrit and consider him for transfusion if needed. The patient will come back for follow-up visit in 3 weeks for evaluation before the next cycle of his treatment. He was advised to call immediately if he has any concerning symptoms in the interval.  The patient voices understanding of current disease status and treatment options and is in agreement with the current care plan.  All questions were answered. The patient knows to call the clinic with any problems, questions or concerns. We can certainly see the patient much sooner if necessary.  Disclaimer: This note was dictated with voice recognition software. Similar sounding words can inadvertently be transcribed and may not be corrected upon review.

## 2019-12-31 NOTE — Telephone Encounter (Signed)
Per Lonn Georgia, RN stated, pt request to have injection at [facility] on 7/30. Called Dorita Fray and pt was scheduled for 7/30at 11am. Kayla,RN made pt aware of time and date and advised to be on time for appt due to center closing early on Fridays.

## 2019-12-31 NOTE — Patient Instructions (Signed)
[facility] Discharge Instructions for Patients Receiving Chemotherapy  Today you received the following chemotherapy agents: Taxol/Carboplatin.  To help prevent nausea and vomiting after your treatment, we encourage you to take your nausea medication as directed.   If you develop nausea and vomiting that is not controlled by your nausea medication, call the clinic.   BELOW ARE SYMPTOMS THAT SHOULD BE REPORTED IMMEDIATELY:  *FEVER GREATER THAN 100.5 F  *CHILLS WITH OR WITHOUT FEVER  NAUSEA AND VOMITING THAT IS NOT CONTROLLED WITH YOUR NAUSEA MEDICATION  *UNUSUAL SHORTNESS OF BREATH  *UNUSUAL BRUISING OR BLEEDING  TENDERNESS IN MOUTH AND THROAT WITH OR WITHOUT PRESENCE OF ULCERS  *URINARY PROBLEMS  *BOWEL PROBLEMS  UNUSUAL RASH Items with * indicate a potential emergency and should be followed up as soon as possible.  Feel free to call the clinic should you have any questions or concerns. The clinic phone number is [phone].  Please show the [facility] [place] at check-in to the Emergency Department and triage nurse.

## 2020-01-01 DIAGNOSIS — J189 Pneumonia, unspecified organism: Secondary | ICD-10-CM | POA: Diagnosis not present

## 2020-01-01 DIAGNOSIS — B952 Enterococcus as the cause of diseases classified elsewhere: Secondary | ICD-10-CM | POA: Diagnosis not present

## 2020-01-01 DIAGNOSIS — N390 Urinary tract infection, site not specified: Secondary | ICD-10-CM | POA: Diagnosis not present

## 2020-01-01 DIAGNOSIS — C3491 Malignant neoplasm of unspecified part of right bronchus or lung: Secondary | ICD-10-CM | POA: Diagnosis not present

## 2020-01-01 DIAGNOSIS — J440 Chronic obstructive pulmonary disease with acute lower respiratory infection: Secondary | ICD-10-CM | POA: Diagnosis not present

## 2020-01-01 DIAGNOSIS — J9621 Acute and chronic respiratory failure with hypoxia: Secondary | ICD-10-CM | POA: Diagnosis not present

## 2020-01-02 ENCOUNTER — Inpatient Hospital Stay: Payer: Medicare Other

## 2020-01-02 ENCOUNTER — Inpatient Hospital Stay (HOSPITAL_COMMUNITY): Payer: Medicare Other

## 2020-01-02 ENCOUNTER — Other Ambulatory Visit: Payer: Self-pay

## 2020-01-02 ENCOUNTER — Encounter (HOSPITAL_COMMUNITY): Payer: Self-pay

## 2020-01-02 VITALS — BP 122/74 | HR 92 | Temp 96.9°F | Resp 18

## 2020-01-02 DIAGNOSIS — J9621 Acute and chronic respiratory failure with hypoxia: Secondary | ICD-10-CM | POA: Diagnosis not present

## 2020-01-02 DIAGNOSIS — C3491 Malignant neoplasm of unspecified part of right bronchus or lung: Secondary | ICD-10-CM | POA: Diagnosis not present

## 2020-01-02 DIAGNOSIS — J189 Pneumonia, unspecified organism: Secondary | ICD-10-CM | POA: Diagnosis not present

## 2020-01-02 DIAGNOSIS — N390 Urinary tract infection, site not specified: Secondary | ICD-10-CM | POA: Diagnosis not present

## 2020-01-02 DIAGNOSIS — J440 Chronic obstructive pulmonary disease with acute lower respiratory infection: Secondary | ICD-10-CM | POA: Diagnosis not present

## 2020-01-02 DIAGNOSIS — C3431 Malignant neoplasm of lower lobe, right bronchus or lung: Secondary | ICD-10-CM | POA: Diagnosis not present

## 2020-01-02 DIAGNOSIS — Z5189 Encounter for other specified aftercare: Secondary | ICD-10-CM | POA: Diagnosis not present

## 2020-01-02 DIAGNOSIS — B952 Enterococcus as the cause of diseases classified elsewhere: Secondary | ICD-10-CM | POA: Diagnosis not present

## 2020-01-02 MED ORDER — PEGFILGRASTIM-JMDB 6 MG/0.6ML ~~LOC~~ SOSY
6.0000 mg | PREFILLED_SYRINGE | Freq: Once | SUBCUTANEOUS | Status: AC
  Administered 2020-01-02: 6 mg via SUBCUTANEOUS
  Filled 2020-01-02: qty 0.6

## 2020-01-02 NOTE — Progress Notes (Signed)
Patient tolerated Fulphila injection with no complaints voiced.  Site clean and dry with no bruising or swelling noted at site.  Band aid applied.  VSS with discharge and left in a wheelchair with no s/s of distress noted.

## 2020-01-05 DIAGNOSIS — M549 Dorsalgia, unspecified: Secondary | ICD-10-CM | POA: Diagnosis not present

## 2020-01-05 DIAGNOSIS — Z79899 Other long term (current) drug therapy: Secondary | ICD-10-CM | POA: Diagnosis not present

## 2020-01-05 DIAGNOSIS — M542 Cervicalgia: Secondary | ICD-10-CM | POA: Diagnosis not present

## 2020-01-05 DIAGNOSIS — M1990 Unspecified osteoarthritis, unspecified site: Secondary | ICD-10-CM | POA: Diagnosis not present

## 2020-01-05 DIAGNOSIS — E785 Hyperlipidemia, unspecified: Secondary | ICD-10-CM | POA: Diagnosis not present

## 2020-01-05 DIAGNOSIS — E119 Type 2 diabetes mellitus without complications: Secondary | ICD-10-CM | POA: Diagnosis not present

## 2020-01-05 DIAGNOSIS — I10 Essential (primary) hypertension: Secondary | ICD-10-CM | POA: Diagnosis not present

## 2020-01-05 DIAGNOSIS — R21 Rash and other nonspecific skin eruption: Secondary | ICD-10-CM | POA: Diagnosis not present

## 2020-01-05 DIAGNOSIS — C3490 Malignant neoplasm of unspecified part of unspecified bronchus or lung: Secondary | ICD-10-CM | POA: Diagnosis not present

## 2020-01-05 DIAGNOSIS — L4050 Arthropathic psoriasis, unspecified: Secondary | ICD-10-CM | POA: Diagnosis not present

## 2020-01-05 DIAGNOSIS — R634 Abnormal weight loss: Secondary | ICD-10-CM | POA: Diagnosis not present

## 2020-01-06 ENCOUNTER — Other Ambulatory Visit: Payer: Self-pay | Admitting: Medical Oncology

## 2020-01-06 DIAGNOSIS — L308 Other specified dermatitis: Secondary | ICD-10-CM | POA: Diagnosis not present

## 2020-01-06 DIAGNOSIS — D485 Neoplasm of uncertain behavior of skin: Secondary | ICD-10-CM | POA: Diagnosis not present

## 2020-01-06 DIAGNOSIS — L309 Dermatitis, unspecified: Secondary | ICD-10-CM | POA: Diagnosis not present

## 2020-01-06 DIAGNOSIS — C3491 Malignant neoplasm of unspecified part of right bronchus or lung: Secondary | ICD-10-CM

## 2020-01-07 ENCOUNTER — Inpatient Hospital Stay: Payer: Medicare Other | Attending: Internal Medicine

## 2020-01-07 ENCOUNTER — Inpatient Hospital Stay: Payer: Medicare Other

## 2020-01-07 ENCOUNTER — Other Ambulatory Visit: Payer: Self-pay

## 2020-01-07 DIAGNOSIS — J449 Chronic obstructive pulmonary disease, unspecified: Secondary | ICD-10-CM | POA: Diagnosis not present

## 2020-01-07 DIAGNOSIS — Z5111 Encounter for antineoplastic chemotherapy: Secondary | ICD-10-CM | POA: Insufficient documentation

## 2020-01-07 DIAGNOSIS — J910 Malignant pleural effusion: Secondary | ICD-10-CM | POA: Insufficient documentation

## 2020-01-07 DIAGNOSIS — Z79899 Other long term (current) drug therapy: Secondary | ICD-10-CM | POA: Insufficient documentation

## 2020-01-07 DIAGNOSIS — C3491 Malignant neoplasm of unspecified part of right bronchus or lung: Secondary | ICD-10-CM | POA: Diagnosis not present

## 2020-01-07 DIAGNOSIS — Z87891 Personal history of nicotine dependence: Secondary | ICD-10-CM | POA: Insufficient documentation

## 2020-01-07 DIAGNOSIS — T451X5A Adverse effect of antineoplastic and immunosuppressive drugs, initial encounter: Secondary | ICD-10-CM | POA: Diagnosis not present

## 2020-01-07 DIAGNOSIS — D6481 Anemia due to antineoplastic chemotherapy: Secondary | ICD-10-CM | POA: Diagnosis not present

## 2020-01-07 DIAGNOSIS — Z923 Personal history of irradiation: Secondary | ICD-10-CM | POA: Diagnosis not present

## 2020-01-07 DIAGNOSIS — R21 Rash and other nonspecific skin eruption: Secondary | ICD-10-CM | POA: Diagnosis not present

## 2020-01-07 DIAGNOSIS — Z7984 Long term (current) use of oral hypoglycemic drugs: Secondary | ICD-10-CM | POA: Diagnosis not present

## 2020-01-07 DIAGNOSIS — Z7951 Long term (current) use of inhaled steroids: Secondary | ICD-10-CM | POA: Insufficient documentation

## 2020-01-07 DIAGNOSIS — R5383 Other fatigue: Secondary | ICD-10-CM | POA: Insufficient documentation

## 2020-01-07 DIAGNOSIS — I10 Essential (primary) hypertension: Secondary | ICD-10-CM | POA: Diagnosis not present

## 2020-01-07 DIAGNOSIS — E1136 Type 2 diabetes mellitus with diabetic cataract: Secondary | ICD-10-CM | POA: Diagnosis not present

## 2020-01-07 DIAGNOSIS — Z95828 Presence of other vascular implants and grafts: Secondary | ICD-10-CM

## 2020-01-07 LAB — CBC WITH DIFFERENTIAL (CANCER CENTER ONLY)
Abs Immature Granulocytes: 1.24 10*3/uL — ABNORMAL HIGH (ref 0.00–0.07)
Basophils Absolute: 0.1 10*3/uL (ref 0.0–0.1)
Basophils Relative: 1 %
Eosinophils Absolute: 0 10*3/uL (ref 0.0–0.5)
Eosinophils Relative: 0 %
HCT: 29.3 % — ABNORMAL LOW (ref 39.0–52.0)
Hemoglobin: 9.2 g/dL — ABNORMAL LOW (ref 13.0–17.0)
Immature Granulocytes: 6 %
Lymphocytes Relative: 6 %
Lymphs Abs: 1.3 10*3/uL (ref 0.7–4.0)
MCH: 28.8 pg (ref 26.0–34.0)
MCHC: 31.4 g/dL (ref 30.0–36.0)
MCV: 91.8 fL (ref 80.0–100.0)
Monocytes Absolute: 1.2 10*3/uL — ABNORMAL HIGH (ref 0.1–1.0)
Monocytes Relative: 6 %
Neutro Abs: 18 10*3/uL — ABNORMAL HIGH (ref 1.7–7.7)
Neutrophils Relative %: 81 %
Platelet Count: 171 10*3/uL (ref 150–400)
RBC: 3.19 MIL/uL — ABNORMAL LOW (ref 4.22–5.81)
RDW: 17.4 % — ABNORMAL HIGH (ref 11.5–15.5)
WBC Count: 21.9 10*3/uL — ABNORMAL HIGH (ref 4.0–10.5)
nRBC: 0 % (ref 0.0–0.2)

## 2020-01-07 LAB — CMP (CANCER CENTER ONLY)
ALT: 12 U/L (ref 0–44)
AST: 9 U/L — ABNORMAL LOW (ref 15–41)
Albumin: 2.9 g/dL — ABNORMAL LOW (ref 3.5–5.0)
Alkaline Phosphatase: 152 U/L — ABNORMAL HIGH (ref 38–126)
Anion gap: 8 (ref 5–15)
BUN: 25 mg/dL — ABNORMAL HIGH (ref 8–23)
CO2: 28 mmol/L (ref 22–32)
Calcium: 9.4 mg/dL (ref 8.9–10.3)
Chloride: 102 mmol/L (ref 98–111)
Creatinine: 0.73 mg/dL (ref 0.61–1.24)
GFR, Est AFR Am: >60 mL/min (ref >60)
GFR, Estimated: >60 mL/min (ref >60)
Glucose, Bld: 118 mg/dL — ABNORMAL HIGH (ref 70–99)
Potassium: 4.4 mmol/L (ref 3.5–5.1)
Sodium: 138 mmol/L (ref 135–145)
Total Bilirubin: 0.2 mg/dL — ABNORMAL LOW (ref 0.3–1.2)
Total Protein: 7.8 g/dL (ref 6.5–8.1)

## 2020-01-07 LAB — SAMPLE TO BLOOD BANK

## 2020-01-07 MED ORDER — SODIUM CHLORIDE 0.9% FLUSH
10.0000 mL | INTRAVENOUS | Status: DC | PRN
  Administered 2020-01-07: 10 mL
  Filled 2020-01-07: qty 10

## 2020-01-07 MED ORDER — HEPARIN SOD (PORK) LOCK FLUSH 100 UNIT/ML IV SOLN
500.0000 [IU] | Freq: Once | INTRAVENOUS | Status: AC | PRN
  Administered 2020-01-07: 500 [IU]
  Filled 2020-01-07: qty 5

## 2020-01-13 DIAGNOSIS — C7802 Secondary malignant neoplasm of left lung: Secondary | ICD-10-CM | POA: Diagnosis not present

## 2020-01-13 DIAGNOSIS — I10 Essential (primary) hypertension: Secondary | ICD-10-CM | POA: Diagnosis not present

## 2020-01-13 DIAGNOSIS — L4050 Arthropathic psoriasis, unspecified: Secondary | ICD-10-CM | POA: Diagnosis not present

## 2020-01-13 DIAGNOSIS — J439 Emphysema, unspecified: Secondary | ICD-10-CM | POA: Diagnosis not present

## 2020-01-13 DIAGNOSIS — C801 Malignant (primary) neoplasm, unspecified: Secondary | ICD-10-CM | POA: Diagnosis not present

## 2020-01-13 DIAGNOSIS — C3490 Malignant neoplasm of unspecified part of unspecified bronchus or lung: Secondary | ICD-10-CM | POA: Diagnosis not present

## 2020-01-14 ENCOUNTER — Other Ambulatory Visit: Payer: Self-pay | Admitting: Medical Oncology

## 2020-01-14 ENCOUNTER — Inpatient Hospital Stay: Payer: Medicare Other

## 2020-01-14 ENCOUNTER — Other Ambulatory Visit: Payer: Self-pay

## 2020-01-14 DIAGNOSIS — C3491 Malignant neoplasm of unspecified part of right bronchus or lung: Secondary | ICD-10-CM | POA: Diagnosis not present

## 2020-01-14 DIAGNOSIS — Z923 Personal history of irradiation: Secondary | ICD-10-CM | POA: Diagnosis not present

## 2020-01-14 DIAGNOSIS — R5382 Chronic fatigue, unspecified: Secondary | ICD-10-CM

## 2020-01-14 DIAGNOSIS — T451X5A Adverse effect of antineoplastic and immunosuppressive drugs, initial encounter: Secondary | ICD-10-CM | POA: Diagnosis not present

## 2020-01-14 DIAGNOSIS — J910 Malignant pleural effusion: Secondary | ICD-10-CM | POA: Diagnosis not present

## 2020-01-14 DIAGNOSIS — Z5111 Encounter for antineoplastic chemotherapy: Secondary | ICD-10-CM | POA: Diagnosis not present

## 2020-01-14 DIAGNOSIS — D6481 Anemia due to antineoplastic chemotherapy: Secondary | ICD-10-CM | POA: Diagnosis not present

## 2020-01-14 DIAGNOSIS — Z95828 Presence of other vascular implants and grafts: Secondary | ICD-10-CM

## 2020-01-14 LAB — CBC WITH DIFFERENTIAL (CANCER CENTER ONLY)
Abs Immature Granulocytes: 0.22 10*3/uL — ABNORMAL HIGH (ref 0.00–0.07)
Basophils Absolute: 0 10*3/uL (ref 0.0–0.1)
Basophils Relative: 0 %
Eosinophils Absolute: 0 10*3/uL (ref 0.0–0.5)
Eosinophils Relative: 0 %
HCT: 26.8 % — ABNORMAL LOW (ref 39.0–52.0)
Hemoglobin: 8.4 g/dL — ABNORMAL LOW (ref 13.0–17.0)
Immature Granulocytes: 2 %
Lymphocytes Relative: 7 %
Lymphs Abs: 1 10*3/uL (ref 0.7–4.0)
MCH: 29.2 pg (ref 26.0–34.0)
MCHC: 31.3 g/dL (ref 30.0–36.0)
MCV: 93.1 fL (ref 80.0–100.0)
Monocytes Absolute: 0.7 10*3/uL (ref 0.1–1.0)
Monocytes Relative: 5 %
Neutro Abs: 12.3 10*3/uL — ABNORMAL HIGH (ref 1.7–7.7)
Neutrophils Relative %: 86 %
Platelet Count: 128 10*3/uL — ABNORMAL LOW (ref 150–400)
RBC: 2.88 MIL/uL — ABNORMAL LOW (ref 4.22–5.81)
RDW: 18.8 % — ABNORMAL HIGH (ref 11.5–15.5)
WBC Count: 14.3 10*3/uL — ABNORMAL HIGH (ref 4.0–10.5)
nRBC: 0 % (ref 0.0–0.2)

## 2020-01-14 LAB — CMP (CANCER CENTER ONLY)
ALT: 10 U/L (ref 0–44)
AST: 7 U/L — ABNORMAL LOW (ref 15–41)
Albumin: 2.7 g/dL — ABNORMAL LOW (ref 3.5–5.0)
Alkaline Phosphatase: 132 U/L — ABNORMAL HIGH (ref 38–126)
Anion gap: 9 (ref 5–15)
BUN: 17 mg/dL (ref 8–23)
CO2: 27 mmol/L (ref 22–32)
Calcium: 9.7 mg/dL (ref 8.9–10.3)
Chloride: 103 mmol/L (ref 98–111)
Creatinine: 0.7 mg/dL (ref 0.61–1.24)
GFR, Est AFR Am: >60 mL/min (ref >60)
GFR, Estimated: >60 mL/min (ref >60)
Glucose, Bld: 107 mg/dL — ABNORMAL HIGH (ref 70–99)
Potassium: 4.4 mmol/L (ref 3.5–5.1)
Sodium: 139 mmol/L (ref 135–145)
Total Bilirubin: 0.3 mg/dL (ref 0.3–1.2)
Total Protein: 7.9 g/dL (ref 6.5–8.1)

## 2020-01-14 LAB — SAMPLE TO BLOOD BANK

## 2020-01-14 LAB — TSH: TSH: 1.352 u[IU]/mL (ref 0.320–4.118)

## 2020-01-14 MED ORDER — SODIUM CHLORIDE 0.9% FLUSH
10.0000 mL | INTRAVENOUS | Status: DC | PRN
  Administered 2020-01-14: 10 mL
  Filled 2020-01-14: qty 10

## 2020-01-14 MED ORDER — HEPARIN SOD (PORK) LOCK FLUSH 100 UNIT/ML IV SOLN
500.0000 [IU] | Freq: Once | INTRAVENOUS | Status: AC | PRN
  Administered 2020-01-14: 500 [IU]
  Filled 2020-01-14: qty 5

## 2020-01-19 ENCOUNTER — Other Ambulatory Visit: Payer: Self-pay | Admitting: Medical Oncology

## 2020-01-19 DIAGNOSIS — C3491 Malignant neoplasm of unspecified part of right bronchus or lung: Secondary | ICD-10-CM

## 2020-01-19 DIAGNOSIS — D630 Anemia in neoplastic disease: Secondary | ICD-10-CM

## 2020-01-20 ENCOUNTER — Encounter: Payer: Self-pay | Admitting: Cardiology

## 2020-01-20 ENCOUNTER — Ambulatory Visit (INDEPENDENT_AMBULATORY_CARE_PROVIDER_SITE_OTHER): Payer: Medicare Other | Admitting: Cardiology

## 2020-01-20 VITALS — BP 126/70 | HR 99 | Ht 74.0 in | Wt 167.8 lb

## 2020-01-20 DIAGNOSIS — I480 Paroxysmal atrial fibrillation: Secondary | ICD-10-CM | POA: Diagnosis not present

## 2020-01-20 MED ORDER — METOPROLOL TARTRATE 25 MG PO TABS: 25.0000 mg | ORAL_TABLET | Freq: Two times a day (BID) | ORAL | 1 refills | Status: DC

## 2020-01-20 NOTE — Patient Instructions (Signed)

## 2020-01-20 NOTE — Progress Notes (Signed)
Clinical Summary Mr. Ruvalcaba is a 74 y.o.male seen today for follow up of the followingmedical problems.   1. PAF - no recent palpitaitons.  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    2. Lung cancer stage IIIc/IV (T4, N2, M0/M1 a) non-small cell lung cancer, squamous cell carcinoma. Hepresented with large necrotic mass extending to the carina with associated obstruction of the right lower lobe bronchus and postobstructive collapse/consolidation and necrosis in the right lower lobe as well as mediastinal lymphadenopathy with 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  - on chemo with carboplatin for AUC of 5, paclitaxel 175 mg/M2 and Keytruda 200 mg IV every 3 weeks with Neulasta support.   3. 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 Hgb down to 7.5 during 4/12 visit, was to get 1 unit prbc - last transfusion about 4 weeks ago, got 2 units Past Medical History:  Diagnosis Date  . Cancer ([place])    mass right lung  . COPD (chronic obstructive pulmonary disease) ([facility])    QUIT SMOKING 30 YRS AGO  . Diabetes ([place]) 2018  . HTN (hypertension)   . Psoriatic arthritis ([place]) 2015     Allergies  Allergen Reactions  . Keytruda [Pembrolizumab] Rash  . Chlorhexidine     Per pt, tolerates chlorhexidine when accessing port a cath with no issues and does not remember having a reaction to this.  Tori Milks [Naproxen Sodium] Hives and Rash     Current Outpatient Medications  Medication Sig Dispense Refill  . acetaminophen (TYLENOL) 325 MG tablet Take 2 tablets (650 mg total) by mouth every 6 (six) hours as needed for mild pain, fever or headache. 120 tablet 0  . albuterol (VENTOLIN HFA) 108 (90 Base) MCG/ACT inhaler Inhale 2 puffs into the lungs every 6 (six) hours as needed for wheezing or shortness of breath.    . cyclobenzaprine (FLEXERIL) 10 MG tablet     . diltiazem  (CARDIZEM CD) 360 MG 24 hr capsule Take 1 capsule (360 mg total) by mouth daily. 30 capsule 3  . doxycycline (MONODOX) 50 MG capsule Take 50 mg by mouth 2 (two) times daily as needed (rosacea flare ups.).    Marland Kitchen ferrous sulfate 325 (65 FE) MG tablet Take 325 mg by mouth daily with supper.    Marland Kitchen FLOVENT HFA 110 MCG/ACT inhaler Inhale 1 puff into the lungs 2 (two) times daily.     . folic acid (FOLVITE) 1 MG tablet Take 1 mg by mouth daily.    Marland Kitchen gabapentin (NEURONTIN) 300 MG capsule Take 600 mg by mouth at bedtime.    . lidocaine-prilocaine (EMLA) cream Apply to the Port-A-Cath site 30 minutes before treatment 30 g 0  . loratadine (CLARITIN) 10 MG tablet Take 10 mg by mouth daily.    . metFORMIN (GLUCOPHAGE-XR) 500 MG 24 hr tablet Take 500-1,000 mg by mouth See admin instructions. Take 2 tablets (1000 mg) by mouth in the morning & 1 tablet (500 mg) by mouth at night.    . metoprolol tartrate (LOPRESSOR) 25 MG tablet Take 1 tablet (25 mg total) by mouth 2 (two) times daily. 60 tablet 2  . metroNIDAZOLE (METROCREAM) 0.75 % cream Apply 1 application topically 2 (two) times daily as needed (facial rosacea).     . mometasone (ELOCON) 0.1 % cream Apply 1 application topically 2 (two) times daily as needed (psoriasis).     Marland Kitchen  Omega-3 Fatty Acids (FISH OIL) 1000 MG CAPS Take 1,000 mg by mouth daily.     Marland Kitchen oxyCODONE-acetaminophen (PERCOCET/ROXICET) 5-325 MG tablet Take 1 tablet by mouth every 4 (four) hours as needed for severe pain.    . pantoprazole (PROTONIX) 40 MG tablet 1 po 30 mins prior to first meal 30 tablet 11  . prochlorperazine (COMPAZINE) 10 MG tablet Take 1 tablet (10 mg total) by mouth every 6 (six) hours as needed for nausea or vomiting. 30 tablet 0  . senna-docusate (SENOKOT-S) 8.6-50 MG tablet Take 2 tablets by mouth at bedtime. 60 tablet 1  . traMADol (ULTRAM) 50 MG tablet Take 50 mg by mouth every 4 (four) hours as needed for moderate pain. for pain    . triamcinolone lotion (KENALOG) 0.1 %  Apply 1 application topically 4 (four) times daily. 120 mL 1  . umeclidinium-vilanterol (ANORO ELLIPTA) 62.5-25 MCG/INH AEPB Inhale 1 puff into the lungs daily. 30 each 0   No current facility-administered medications for this visit.     Past Surgical History:  Procedure Laterality Date  . BACK SURGERY  1990  . BIOPSY  12/30/2018   Procedure: BIOPSY;  Surgeon: Danie Binder, MD;  [facility];  Service: Endoscopy;;  gastric  . CATARACT EXTRACTION Bilateral   . CERVICAL SPINE SURGERY  1995  . COLONOSCOPY WITH PROPOFOL N/A 12/30/2018   Procedure: COLONOSCOPY WITH PROPOFOL;  Surgeon: Danie Binder, MD;  [facility];  Service: Endoscopy;  Laterality: N/A;  12:30pm  . ESOPHAGOGASTRODUODENOSCOPY (EGD) WITH PROPOFOL N/A 12/30/2018   Procedure: ESOPHAGOGASTRODUODENOSCOPY (EGD) WITH PROPOFOL;  Surgeon: Danie Binder, MD;  [facility];  Service: Endoscopy;  Laterality: N/A;  . IR IMAGING GUIDED PORT INSERTION  09/04/2019  . POLYPECTOMY  12/30/2018   Procedure: POLYPECTOMY;  Surgeon: Danie Binder, MD;  [facility];  Service: Endoscopy;;  colon  . THORACENTESIS Right 08/08/2019   Procedure: Thoracentesis;  Surgeon: [name], MD;  Location: [facility];  Service: Pulmonary;  Laterality: Right;  Marland Kitchen VIDEO BRONCHOSCOPY Right 08/08/2019   Procedure: Video Bronchoscopy with Erbe Cryo Biopsy of Right mainstem;  Surgeon: [name], MD;  Location: [facility] OR;  Service: Pulmonary;  Laterality: Right;     Allergies  Allergen Reactions  . Keytruda [Pembrolizumab] Rash  . Chlorhexidine     Per pt, tolerates chlorhexidine when accessing port a cath with no issues and does not remember having a reaction to this.  Tori Milks [Naproxen Sodium] Hives and Rash      Family History  Problem Relation Age of Onset  . Alcoholism Father   . CAD Brother   . Diabetes Brother   . Colon cancer Neg Hx   . Colon polyps Neg Hx   . Gastric cancer Neg Hx      Social  History Mr. Warzecha reports that he quit smoking about 28 years ago. His smoking use included cigarettes. He has a 25.00 pack-year smoking history. He has never used smokeless tobacco. Mr. Armor reports current alcohol use.   Review of Systems CONSTITUTIONAL: No weight loss, fever, chills, weakness or fatigue.  HEENT: Eyes: No visual loss, blurred vision, double vision or yellow sclerae.No hearing loss, sneezing, congestion, runny nose or sore throat.  SKIN: No rash or itching.  CARDIOVASCULAR: per hpi RESPIRATORY: No shortness of breath, cough or sputum.  GASTROINTESTINAL: No anorexia, nausea, vomiting or diarrhea. No abdominal pain or blood.  GENITOURINARY: No burning on urination, no  polyuria NEUROLOGICAL: No headache, dizziness, syncope, paralysis, ataxia, numbness or tingling in the extremities. No change in bowel or bladder control.  MUSCULOSKELETAL: No muscle, back pain, joint pain or stiffness.  LYMPHATICS: No enlarged nodes. No history of splenectomy.  PSYCHIATRIC: No history of depression or anxiety.  ENDOCRINOLOGIC: No reports of sweating, cold or heat intolerance. No polyuria or polydipsia.  Marland Kitchen   Physical Examination Vitals:   01/20/20 1528  BP: 126/70  Pulse: 99  SpO2: 97%   Filed Weights   01/20/20 1528  Weight: 167 lb 12.8 oz (76.1 kg)    Gen: resting comfortably, no acute distress HEENT: no scleral icterus, pupils equal round and reactive, no palptable cervical adenopathy,  CV: irreg, tachy, no m//r/g, no jvd Resp: Clear to auscultation bilaterally GI: abdomen is soft, non-tender, non-distended, normal bowel sounds, no hepatosplenomegaly MSK: extremities are warm, no edema.  Skin: warm, no rash Neuro:  no focal deficits Psych: appropriate affect   Diagnostic Studies 10/2019 echo 1. Left ventricular ejection fraction, by estimation, is 60 to 65%. The  left ventricle has normal function. Left ventricular endocardial border  not optimally defined to evaluate  regional wall motion. Left ventricular  diastolic parameters are consistent  with Grade I diastolic dysfunction (impaired relaxation).  2. Right ventricular systolic function is normal. The right ventricular  size is moderately enlarged.  3. Right atrial size was mild to moderately dilated.  4. The mitral valve was not well visualized. Trivial mitral valve  regurgitation.  5. Tricuspid valve regurgitation unable to assess.  6. The aortic valve was not well visualized. Aortic valve regurgitation  is mild.  7. Pulmonic valve regurgitation unable to assess.  8. Aortic dilatation noted. There is mild dilatation of the aortic root.     Assessment and Plan  1. PAF - asymptomatic, episodes typically noticed during medical visits or by vitals checks at home - EKG today shows mild sinus tachycardia - no anticoag due to ongoing transfuions dependent anemia - continue current meds    Arnoldo Lenis, M.D.

## 2020-01-21 ENCOUNTER — Encounter: Payer: Self-pay | Admitting: Internal Medicine

## 2020-01-21 ENCOUNTER — Inpatient Hospital Stay: Payer: Medicare Other

## 2020-01-21 ENCOUNTER — Other Ambulatory Visit: Payer: Self-pay

## 2020-01-21 ENCOUNTER — Inpatient Hospital Stay (HOSPITAL_BASED_OUTPATIENT_CLINIC_OR_DEPARTMENT_OTHER): Payer: Medicare Other | Admitting: Internal Medicine

## 2020-01-21 ENCOUNTER — Inpatient Hospital Stay: Payer: Medicare Other | Admitting: Nutrition

## 2020-01-21 VITALS — BP 100/69 | HR 91 | Temp 97.3°F | Resp 17 | Ht 74.0 in | Wt 166.6 lb

## 2020-01-21 DIAGNOSIS — D630 Anemia in neoplastic disease: Secondary | ICD-10-CM

## 2020-01-21 DIAGNOSIS — C3491 Malignant neoplasm of unspecified part of right bronchus or lung: Secondary | ICD-10-CM

## 2020-01-21 DIAGNOSIS — Z5111 Encounter for antineoplastic chemotherapy: Secondary | ICD-10-CM | POA: Diagnosis not present

## 2020-01-21 DIAGNOSIS — T451X5A Adverse effect of antineoplastic and immunosuppressive drugs, initial encounter: Secondary | ICD-10-CM | POA: Diagnosis not present

## 2020-01-21 DIAGNOSIS — Z923 Personal history of irradiation: Secondary | ICD-10-CM | POA: Diagnosis not present

## 2020-01-21 DIAGNOSIS — D6481 Anemia due to antineoplastic chemotherapy: Secondary | ICD-10-CM | POA: Diagnosis not present

## 2020-01-21 DIAGNOSIS — R531 Weakness: Secondary | ICD-10-CM

## 2020-01-21 DIAGNOSIS — C3490 Malignant neoplasm of unspecified part of unspecified bronchus or lung: Secondary | ICD-10-CM

## 2020-01-21 DIAGNOSIS — R5382 Chronic fatigue, unspecified: Secondary | ICD-10-CM

## 2020-01-21 DIAGNOSIS — Z95828 Presence of other vascular implants and grafts: Secondary | ICD-10-CM

## 2020-01-21 DIAGNOSIS — J910 Malignant pleural effusion: Secondary | ICD-10-CM | POA: Diagnosis not present

## 2020-01-21 LAB — CMP (CANCER CENTER ONLY)
ALT: 8 U/L (ref 0–44)
AST: 8 U/L — ABNORMAL LOW (ref 15–41)
Albumin: 2.6 g/dL — ABNORMAL LOW (ref 3.5–5.0)
Alkaline Phosphatase: 107 U/L (ref 38–126)
Anion gap: 9 (ref 5–15)
BUN: 17 mg/dL (ref 8–23)
CO2: 28 mmol/L (ref 22–32)
Calcium: 9.7 mg/dL (ref 8.9–10.3)
Chloride: 101 mmol/L (ref 98–111)
Creatinine: 0.76 mg/dL (ref 0.61–1.24)
GFR, Est AFR Am: >60 mL/min (ref >60)
GFR, Estimated: >60 mL/min (ref >60)
Glucose, Bld: 130 mg/dL — ABNORMAL HIGH (ref 70–99)
Potassium: 4.2 mmol/L (ref 3.5–5.1)
Sodium: 138 mmol/L (ref 135–145)
Total Bilirubin: 0.3 mg/dL (ref 0.3–1.2)
Total Protein: 7.8 g/dL (ref 6.5–8.1)

## 2020-01-21 LAB — TSH: TSH: 1.098 u[IU]/mL (ref 0.320–4.118)

## 2020-01-21 LAB — CBC WITH DIFFERENTIAL (CANCER CENTER ONLY)
Abs Immature Granulocytes: 0.08 10*3/uL — ABNORMAL HIGH (ref 0.00–0.07)
Basophils Absolute: 0 10*3/uL (ref 0.0–0.1)
Basophils Relative: 0 %
Eosinophils Absolute: 0 10*3/uL (ref 0.0–0.5)
Eosinophils Relative: 0 %
HCT: 25.8 % — ABNORMAL LOW (ref 39.0–52.0)
Hemoglobin: 8.1 g/dL — ABNORMAL LOW (ref 13.0–17.0)
Immature Granulocytes: 1 %
Lymphocytes Relative: 10 %
Lymphs Abs: 1 10*3/uL (ref 0.7–4.0)
MCH: 29.7 pg (ref 26.0–34.0)
MCHC: 31.4 g/dL (ref 30.0–36.0)
MCV: 94.5 fL (ref 80.0–100.0)
Monocytes Absolute: 0.6 10*3/uL (ref 0.1–1.0)
Monocytes Relative: 6 %
Neutro Abs: 8.2 10*3/uL — ABNORMAL HIGH (ref 1.7–7.7)
Neutrophils Relative %: 83 %
Platelet Count: 134 10*3/uL — ABNORMAL LOW (ref 150–400)
RBC: 2.73 MIL/uL — ABNORMAL LOW (ref 4.22–5.81)
RDW: 19.4 % — ABNORMAL HIGH (ref 11.5–15.5)
WBC Count: 10 10*3/uL (ref 4.0–10.5)
nRBC: 0 % (ref 0.0–0.2)

## 2020-01-21 LAB — SAMPLE TO BLOOD BANK

## 2020-01-21 MED ORDER — FAMOTIDINE IN NACL 20-0.9 MG/50ML-% IV SOLN
20.0000 mg | Freq: Once | INTRAVENOUS | Status: AC
  Administered 2020-01-21: 20 mg via INTRAVENOUS

## 2020-01-21 MED ORDER — SODIUM CHLORIDE 0.9 % IV SOLN
10.0000 mg | Freq: Once | INTRAVENOUS | Status: AC
  Administered 2020-01-21: 10 mg via INTRAVENOUS
  Filled 2020-01-21: qty 10

## 2020-01-21 MED ORDER — SODIUM CHLORIDE 0.9 % IV SOLN
461.5000 mg | Freq: Once | INTRAVENOUS | Status: AC
  Administered 2020-01-21: 460 mg via INTRAVENOUS
  Filled 2020-01-21: qty 46

## 2020-01-21 MED ORDER — SODIUM CHLORIDE 0.9% FLUSH
10.0000 mL | INTRAVENOUS | Status: DC | PRN
  Administered 2020-01-21: 10 mL
  Filled 2020-01-21: qty 10

## 2020-01-21 MED ORDER — FAMOTIDINE IN NACL 20-0.9 MG/50ML-% IV SOLN
INTRAVENOUS | Status: AC
  Filled 2020-01-21: qty 50

## 2020-01-21 MED ORDER — PALONOSETRON HCL INJECTION 0.25 MG/5ML
0.2500 mg | Freq: Once | INTRAVENOUS | Status: AC
  Administered 2020-01-21: 0.25 mg via INTRAVENOUS

## 2020-01-21 MED ORDER — SODIUM CHLORIDE 0.9 % IV SOLN
Freq: Once | INTRAVENOUS | Status: AC
  Filled 2020-01-21: qty 250

## 2020-01-21 MED ORDER — SODIUM CHLORIDE 0.9 % IV SOLN
175.0000 mg/m2 | Freq: Once | INTRAVENOUS | Status: AC
  Administered 2020-01-21: 348 mg via INTRAVENOUS
  Filled 2020-01-21: qty 58

## 2020-01-21 MED ORDER — PALONOSETRON HCL INJECTION 0.25 MG/5ML
INTRAVENOUS | Status: AC
  Filled 2020-01-21: qty 5

## 2020-01-21 MED ORDER — HEPARIN SOD (PORK) LOCK FLUSH 100 UNIT/ML IV SOLN
500.0000 [IU] | Freq: Once | INTRAVENOUS | Status: AC | PRN
  Administered 2020-01-21: 500 [IU]
  Filled 2020-01-21: qty 5

## 2020-01-21 MED ORDER — DIPHENHYDRAMINE HCL 50 MG/ML IJ SOLN
50.0000 mg | Freq: Once | INTRAMUSCULAR | Status: AC
  Administered 2020-01-21: 50 mg via INTRAVENOUS

## 2020-01-21 MED ORDER — DIPHENHYDRAMINE HCL 50 MG/ML IJ SOLN
INTRAMUSCULAR | Status: AC
  Filled 2020-01-21: qty 1

## 2020-01-21 MED ORDER — SODIUM CHLORIDE 0.9 % IV SOLN
150.0000 mg | Freq: Once | INTRAVENOUS | Status: AC
  Administered 2020-01-21: 150 mg via INTRAVENOUS
  Filled 2020-01-21: qty 150

## 2020-01-21 NOTE — Patient Instructions (Signed)
[facility] Discharge Instructions for Patients Receiving Chemotherapy  Today you received the following chemotherapy agents: paclitaxel and carboplatin.  To help prevent nausea and vomiting after your treatment, we encourage you to take your nausea medication as directed.   If you develop nausea and vomiting that is not controlled by your nausea medication, call the clinic.   BELOW ARE SYMPTOMS THAT SHOULD BE REPORTED IMMEDIATELY:  *FEVER GREATER THAN 100.5 F  *CHILLS WITH OR WITHOUT FEVER  NAUSEA AND VOMITING THAT IS NOT CONTROLLED WITH YOUR NAUSEA MEDICATION  *UNUSUAL SHORTNESS OF BREATH  *UNUSUAL BRUISING OR BLEEDING  TENDERNESS IN MOUTH AND THROAT WITH OR WITHOUT PRESENCE OF ULCERS  *URINARY PROBLEMS  *BOWEL PROBLEMS  UNUSUAL RASH Items with * indicate a potential emergency and should be followed up as soon as possible.  Feel free to call the clinic should you have any questions or concerns. The clinic phone number is [phone].  Please show the CHEMO ALERT CARD at check-in to the Emergency Department and triage nurse.   

## 2020-01-21 NOTE — Progress Notes (Signed)
Nutrition follow-up completed with patient during infusion for lung cancer. Weight improved and documented as 166.6 pounds August 18, increased from 160.4 pounds July 7. Noted labs: Glucose 130 and albumin 2.6. Patient reports he is drinking chocolate Ensure and likes it. He has occasional constipation secondary to pain medication. He denies other nutrition impact symptoms.  Nutrition diagnosis: Unintentional weight loss improved.  Intervention: Educated patient to continue [employer] or Ensure Plus 1-2 bottles daily between meals. Provided samples of chocolate Ensure and coupons. Teach back method used.  Monitoring, evaluation, goals: Patient will tolerate adequate calories and protein to minimize weight loss.  Next visit: To be scheduled as needed.  **Disclaimer: This note was dictated with voice recognition software. Similar sounding words can inadvertently be transcribed and this note may contain transcription errors which may not have been corrected upon publication of note.**

## 2020-01-21 NOTE — Progress Notes (Signed)
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  PD-L1 expression 20%.  PRIOR THERAPY: Palliative radiotherapy under the care of Dr. Sondra Come to the obstructive right lower lobe lung mass.  CURRENT THERAPY: Systemic chemotherapy with carboplatin for AUC of 5, paclitaxel 175 mg/M2 and Keytruda 200 mg IV every 3 weeks with Neulasta support.  First dose September 02, 2019.  Status post 5 cycles  INTERVAL HISTORY: Jeremy [name] 74 y.o. male returns to the clinic today for follow-up visit.  The patient is feeling fine today with no concerning complaints except for fatigue and the skin rash.  He was seen by rheumatology and dermatology and the rash was diagnosed as psoriasis.  He denied having any current chest pain, shortness of breath, cough or hemoptysis.  He denied having any fever or chills.  He has no nausea, vomiting, diarrhea or constipation.  He has no headache or visual changes.  He tolerated the last cycle of his treatment fairly well.  The patient is here today for evaluation before starting cycle #6.  MEDICAL HISTORY: Past Medical History:  Diagnosis Date  . Cancer ([place])    mass right lung  . COPD (chronic obstructive pulmonary disease) (HCC)    QUIT SMOKING 30 YRS AGO  . Diabetes ([place]) 2018  . HTN (hypertension)   . Psoriatic arthritis ([place]) 2015    ALLERGIES:  is allergic to [place] [pembrolizumab], chlorhexidine, and aleve [naproxen sodium].  MEDICATIONS:  Current Outpatient Medications  Medication Sig Dispense Refill  . acetaminophen (TYLENOL) 325 MG tablet Take 2 tablets (650 mg total) by mouth every 6 (six) hours as needed for mild pain, fever or headache. 120 tablet 0  . albuterol (VENTOLIN HFA) 108 (90 Base) MCG/ACT inhaler  Inhale 2 puffs into the lungs every 6 (six) hours as needed for wheezing or shortness of breath.    . cyclobenzaprine (FLEXERIL) 10 MG tablet     . diltiazem (CARDIZEM CD) 360 MG 24 hr capsule Take 1 capsule (360 mg total) by mouth daily. 30 capsule 3  . doxycycline (MONODOX) 50 MG capsule Take 50 mg by mouth 2 (two) times daily as needed (rosacea flare ups.).    Marland Kitchen ferrous sulfate 325 (65 FE) MG tablet Take 325 mg by mouth daily with supper.    Marland Kitchen FLOVENT HFA 110 MCG/ACT inhaler Inhale 1 puff into the lungs 2 (two) times daily.     . folic acid (FOLVITE) 1 MG tablet Take 1 mg by mouth daily.    Marland Kitchen gabapentin (NEURONTIN) 300 MG capsule Take 600 mg by mouth at bedtime.    . lidocaine-prilocaine (EMLA) cream Apply to the Port-A-Cath site 30 minutes before treatment 30 g 0  . loratadine (CLARITIN) 10 MG tablet Take 10 mg by mouth daily.    . metFORMIN (GLUCOPHAGE-XR) 500 MG 24 hr tablet Take 500-1,000 mg by mouth See admin instructions. Take 2 tablets (1000 mg) by mouth in the morning & 1 tablet (500 mg) by mouth at night.    . metoprolol tartrate (LOPRESSOR) 25 MG tablet Take 1 tablet (25 mg total) by mouth 2 (two) times daily. 180 tablet 1  . metroNIDAZOLE (METROCREAM) 0.75 % cream Apply 1 application topically 2 (two) times daily as needed (facial rosacea).     Marland Kitchen  mometasone (ELOCON) 0.1 % cream Apply 1 application topically 2 (two) times daily as needed (psoriasis).     . Omega-3 Fatty Acids (FISH OIL) 1000 MG CAPS Take 1,000 mg by mouth daily.     Marland Kitchen oxyCODONE-acetaminophen (PERCOCET/ROXICET) 5-325 MG tablet Take 1 tablet by mouth every 4 (four) hours as needed for severe pain.    . pantoprazole (PROTONIX) 40 MG tablet 1 po 30 mins prior to first meal 30 tablet 11  . prochlorperazine (COMPAZINE) 10 MG tablet Take 1 tablet (10 mg total) by mouth every 6 (six) hours as needed for nausea or vomiting. 30 tablet 0  . senna-docusate (SENOKOT-S) 8.6-50 MG tablet Take 2 tablets by mouth at bedtime. 60 tablet  1  . traMADol (ULTRAM) 50 MG tablet Take 50 mg by mouth every 4 (four) hours as needed for moderate pain. for pain    . triamcinolone cream (KENALOG) 0.1 % Apply 1 application topically daily as needed.    . triamcinolone lotion (KENALOG) 0.1 % Apply 1 application topically 4 (four) times daily. 120 mL 1  . umeclidinium-vilanterol (ANORO ELLIPTA) 62.5-25 MCG/INH AEPB Inhale 1 puff into the lungs daily. 30 each 0   No current facility-administered medications for this visit.    SURGICAL HISTORY:  Past Surgical History:  Procedure Laterality Date  . BACK SURGERY  1990  . BIOPSY  12/30/2018   Procedure: BIOPSY;  Surgeon: Danie Binder, MD;  [facility];  Service: Endoscopy;;  gastric  . CATARACT EXTRACTION Bilateral   . CERVICAL SPINE SURGERY  1995  . COLONOSCOPY WITH PROPOFOL N/A 12/30/2018   Procedure: COLONOSCOPY WITH PROPOFOL;  Surgeon: Danie Binder, MD;  [facility];  Service: Endoscopy;  Laterality: N/A;  12:30pm  . ESOPHAGOGASTRODUODENOSCOPY (EGD) WITH PROPOFOL N/A 12/30/2018   Procedure: ESOPHAGOGASTRODUODENOSCOPY (EGD) WITH PROPOFOL;  Surgeon: Danie Binder, MD;  [facility];  Service: Endoscopy;  Laterality: N/A;  . IR IMAGING GUIDED PORT INSERTION  09/04/2019  . POLYPECTOMY  12/30/2018   Procedure: POLYPECTOMY;  Surgeon: Danie Binder, MD;  [facility];  Service: Endoscopy;;  colon  . THORACENTESIS Right 08/08/2019   Procedure: Thoracentesis;  Surgeon: [name], MD;  Location: [place];  Service: Pulmonary;  Laterality: Right;  Marland Kitchen VIDEO BRONCHOSCOPY Right 08/08/2019   Procedure: Video Bronchoscopy with Erbe Cryo Biopsy of Right mainstem;  Surgeon: [name], MD;  Location: [facility] OR;  Service: Pulmonary;  Laterality: Right;    REVIEW OF SYSTEMS:  A comprehensive review of systems was negative except for: Constitutional: positive for fatigue Respiratory: positive for dyspnea on exertion Integument/breast: positive for  rash Neurological: positive for paresthesia   PHYSICAL EXAMINATION: General appearance: alert, cooperative, fatigued and no distress Head: Normocephalic, without obvious abnormality, atraumatic Neck: no adenopathy, no JVD, supple, symmetrical, trachea midline and thyroid not enlarged, symmetric, no tenderness/mass/nodules Lymph nodes: Cervical, supraclavicular, and axillary nodes normal. Resp: clear to auscultation bilaterally Back: symmetric, no curvature. ROM normal. No CVA tenderness. Cardio: regular rate and rhythm, S1, S2 normal, no murmur, click, rub or gallop GI: soft, non-tender; bowel sounds normal; no masses,  no organomegaly Extremities: extremities normal, atraumatic, no cyanosis or edema  ECOG PERFORMANCE STATUS: 1 - Symptomatic but completely ambulatory  Blood pressure 100/69, pulse 91, temperature (!) 97.3 F (36.3 C), temperature source Tympanic, resp. rate 17, height '6\' 2"'  (1.88 m), weight 166 lb 9.6 oz (75.6 kg), SpO2 97 %.  LABORATORY DATA: Lab Results  Component Value  Date   WBC 10.0 01/21/2020   HGB 8.1 (L) 01/21/2020   HCT 25.8 (L) 01/21/2020   MCV 94.5 01/21/2020   PLT 134 (L) 01/21/2020      Chemistry      Component Value Date/Time   NA 139 01/14/2020 0958   K 4.4 01/14/2020 0958   CL 103 01/14/2020 0958   CO2 27 01/14/2020 0958   BUN 17 01/14/2020 0958   CREATININE 0.70 01/14/2020 0958      Component Value Date/Time   CALCIUM 9.7 01/14/2020 0958   ALKPHOS 132 (H) 01/14/2020 0958   AST 7 (L) 01/14/2020 0958   ALT 10 01/14/2020 0958   BILITOT 0.3 01/14/2020 0958       RADIOGRAPHIC STUDIES: No results found.  ASSESSMENT AND PLAN: This is a very pleasant 74 years old white male with stage IIIc/IV (T4, N2, M0/M1 a) non-small cell lung cancer, squamous cell carcinoma presented with large necrotic mass extending to the carina with associated obstruction of the right lower lobe bronchus and postobstructive collapse/consolidation and necrosis in the  right lower lobe as well as mediastinal lymphadenopathy with 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   The patient has PD-L1 expression of 20%. The patient completed a course of palliative radiotherapy to the obstructive right lower lobe lung mass under the care of Dr. Sondra Come.  He is feeling a little bit better with less shortness of breath and chest pain. The patient is currently undergoing systemic chemotherapy with carboplatin and paclitaxel.  Status post 5 cycles.  His treatment with [name] was discontinued after cycle #1 secondary to significant skin rash.  He was treated with a tapered dose of prednisone. The patient continues to tolerate his systemic chemotherapy with carboplatin and paclitaxel fairly well. I recommended for him to proceed with cycle #6 today as planned. He will come back for follow-up visit in 3 weeks for evaluation with repeat CT scan of the chest, abdomen pelvis for restaging of his disease. For the aching pain and pain management he will continue on Percocet. For the chemotherapy-induced anemia, we will continue to monitor his hemoglobin and hematocrit and consider him for transfusion if needed. The patient was advised to call immediately if he has any concerning symptoms in the interval. The patient voices understanding of current disease status and treatment options and is in agreement with the current care plan.  All questions were answered. The patient knows to call the clinic with any problems, questions or concerns. We can certainly see the patient much sooner if necessary.  Disclaimer: This note was dictated with voice recognition software. Similar sounding words can inadvertently be transcribed and may not be corrected upon review.

## 2020-01-21 NOTE — Patient Instructions (Signed)

## 2020-01-23 ENCOUNTER — Other Ambulatory Visit: Payer: Self-pay

## 2020-01-23 ENCOUNTER — Encounter (HOSPITAL_COMMUNITY): Payer: Self-pay

## 2020-01-23 ENCOUNTER — Inpatient Hospital Stay: Payer: Medicare Other

## 2020-01-23 ENCOUNTER — Inpatient Hospital Stay (HOSPITAL_COMMUNITY): Payer: Medicare Other | Attending: Hematology

## 2020-01-23 VITALS — BP 135/82 | HR 116 | Temp 97.2°F | Resp 18

## 2020-01-23 DIAGNOSIS — Z5189 Encounter for other specified aftercare: Secondary | ICD-10-CM | POA: Diagnosis not present

## 2020-01-23 DIAGNOSIS — C3431 Malignant neoplasm of lower lobe, right bronchus or lung: Secondary | ICD-10-CM | POA: Insufficient documentation

## 2020-01-23 DIAGNOSIS — C3491 Malignant neoplasm of unspecified part of right bronchus or lung: Secondary | ICD-10-CM

## 2020-01-23 MED ORDER — PEGFILGRASTIM-JMDB 6 MG/0.6ML ~~LOC~~ SOSY
6.0000 mg | PREFILLED_SYRINGE | Freq: Once | SUBCUTANEOUS | Status: AC
  Administered 2020-01-23: 6 mg via SUBCUTANEOUS

## 2020-01-23 MED ORDER — PEGFILGRASTIM-JMDB 6 MG/0.6ML ~~LOC~~ SOSY
PREFILLED_SYRINGE | SUBCUTANEOUS | Status: AC
  Filled 2020-01-23: qty 0.6

## 2020-01-28 ENCOUNTER — Other Ambulatory Visit: Payer: Self-pay

## 2020-01-28 ENCOUNTER — Inpatient Hospital Stay: Payer: Medicare Other

## 2020-01-28 ENCOUNTER — Other Ambulatory Visit: Payer: Self-pay | Admitting: Medical Oncology

## 2020-01-28 DIAGNOSIS — C3491 Malignant neoplasm of unspecified part of right bronchus or lung: Secondary | ICD-10-CM | POA: Diagnosis not present

## 2020-01-28 DIAGNOSIS — Z95828 Presence of other vascular implants and grafts: Secondary | ICD-10-CM

## 2020-01-28 DIAGNOSIS — D649 Anemia, unspecified: Secondary | ICD-10-CM

## 2020-01-28 DIAGNOSIS — Z5111 Encounter for antineoplastic chemotherapy: Secondary | ICD-10-CM | POA: Diagnosis not present

## 2020-01-28 DIAGNOSIS — Z923 Personal history of irradiation: Secondary | ICD-10-CM | POA: Diagnosis not present

## 2020-01-28 DIAGNOSIS — J910 Malignant pleural effusion: Secondary | ICD-10-CM | POA: Diagnosis not present

## 2020-01-28 DIAGNOSIS — T451X5A Adverse effect of antineoplastic and immunosuppressive drugs, initial encounter: Secondary | ICD-10-CM | POA: Diagnosis not present

## 2020-01-28 DIAGNOSIS — D6481 Anemia due to antineoplastic chemotherapy: Secondary | ICD-10-CM | POA: Diagnosis not present

## 2020-01-28 LAB — CBC WITH DIFFERENTIAL (CANCER CENTER ONLY)
Abs Immature Granulocytes: 0.14 10*3/uL — ABNORMAL HIGH (ref 0.00–0.07)
Basophils Absolute: 0 10*3/uL (ref 0.0–0.1)
Basophils Relative: 0 %
Eosinophils Absolute: 0 10*3/uL (ref 0.0–0.5)
Eosinophils Relative: 0 %
HCT: 23.5 % — ABNORMAL LOW (ref 39.0–52.0)
Hemoglobin: 7.3 g/dL — ABNORMAL LOW (ref 13.0–17.0)
Immature Granulocytes: 1 %
Lymphocytes Relative: 7 %
Lymphs Abs: 0.9 10*3/uL (ref 0.7–4.0)
MCH: 29.8 pg (ref 26.0–34.0)
MCHC: 31.1 g/dL (ref 30.0–36.0)
MCV: 95.9 fL (ref 80.0–100.0)
Monocytes Absolute: 0.7 10*3/uL (ref 0.1–1.0)
Monocytes Relative: 6 %
Neutro Abs: 10.7 10*3/uL — ABNORMAL HIGH (ref 1.7–7.7)
Neutrophils Relative %: 86 %
Platelet Count: 86 10*3/uL — ABNORMAL LOW (ref 150–400)
RBC: 2.45 MIL/uL — ABNORMAL LOW (ref 4.22–5.81)
RDW: 18.8 % — ABNORMAL HIGH (ref 11.5–15.5)
WBC Count: 12.5 10*3/uL — ABNORMAL HIGH (ref 4.0–10.5)
nRBC: 0 % (ref 0.0–0.2)

## 2020-01-28 LAB — CMP (CANCER CENTER ONLY)
ALT: 11 U/L (ref 0–44)
AST: 10 U/L — ABNORMAL LOW (ref 15–41)
Albumin: 2.6 g/dL — ABNORMAL LOW (ref 3.5–5.0)
Alkaline Phosphatase: 126 U/L (ref 38–126)
Anion gap: 8 (ref 5–15)
BUN: 23 mg/dL (ref 8–23)
CO2: 30 mmol/L (ref 22–32)
Calcium: 9.5 mg/dL (ref 8.9–10.3)
Chloride: 101 mmol/L (ref 98–111)
Creatinine: 0.79 mg/dL (ref 0.61–1.24)
GFR, Est AFR Am: >60 mL/min (ref >60)
GFR, Estimated: >60 mL/min (ref >60)
Glucose, Bld: 182 mg/dL — ABNORMAL HIGH (ref 70–99)
Potassium: 4.1 mmol/L (ref 3.5–5.1)
Sodium: 139 mmol/L (ref 135–145)
Total Bilirubin: 0.3 mg/dL (ref 0.3–1.2)
Total Protein: 7.7 g/dL (ref 6.5–8.1)

## 2020-01-28 LAB — SAMPLE TO BLOOD BANK

## 2020-01-28 MED ORDER — SODIUM CHLORIDE 0.9% FLUSH
10.0000 mL | INTRAVENOUS | Status: DC | PRN
  Administered 2020-01-28: 10 mL
  Filled 2020-01-28: qty 10

## 2020-01-28 MED ORDER — HEPARIN SOD (PORK) LOCK FLUSH 100 UNIT/ML IV SOLN
500.0000 [IU] | Freq: Once | INTRAVENOUS | Status: AC | PRN
  Administered 2020-01-28: 500 [IU]
  Filled 2020-01-28: qty 5

## 2020-01-29 ENCOUNTER — Other Ambulatory Visit: Payer: Self-pay

## 2020-01-29 ENCOUNTER — Emergency Department (HOSPITAL_COMMUNITY)
Admission: EM | Admit: 2020-01-29 | Discharge: 2020-01-29 | Disposition: A | Discharge status: Home / Self Care | Payer: Medicare Other | Attending: Emergency Medicine | Admitting: Emergency Medicine

## 2020-01-29 ENCOUNTER — Encounter (HOSPITAL_COMMUNITY): Payer: Self-pay | Admitting: *Deleted

## 2020-01-29 ENCOUNTER — Emergency Department (HOSPITAL_COMMUNITY): Payer: Medicare Other

## 2020-01-29 ENCOUNTER — Other Ambulatory Visit (HOSPITAL_COMMUNITY): Payer: Medicare Other

## 2020-01-29 ENCOUNTER — Telehealth: Payer: Self-pay | Admitting: *Deleted

## 2020-01-29 ENCOUNTER — Telehealth: Payer: Self-pay | Admitting: Medical Oncology

## 2020-01-29 DIAGNOSIS — C3491 Malignant neoplasm of unspecified part of right bronchus or lung: Secondary | ICD-10-CM | POA: Diagnosis not present

## 2020-01-29 DIAGNOSIS — Z5111 Encounter for antineoplastic chemotherapy: Secondary | ICD-10-CM | POA: Diagnosis not present

## 2020-01-29 DIAGNOSIS — R000 Tachycardia, unspecified: Secondary | ICD-10-CM | POA: Diagnosis not present

## 2020-01-29 DIAGNOSIS — Z5321 Procedure and treatment not carried out due to patient leaving prior to being seen by health care provider: Secondary | ICD-10-CM | POA: Diagnosis not present

## 2020-01-29 DIAGNOSIS — D6481 Anemia due to antineoplastic chemotherapy: Secondary | ICD-10-CM | POA: Diagnosis not present

## 2020-01-29 DIAGNOSIS — T451X5A Adverse effect of antineoplastic and immunosuppressive drugs, initial encounter: Secondary | ICD-10-CM | POA: Diagnosis not present

## 2020-01-29 DIAGNOSIS — J90 Pleural effusion, not elsewhere classified: Secondary | ICD-10-CM | POA: Diagnosis not present

## 2020-01-29 DIAGNOSIS — Z923 Personal history of irradiation: Secondary | ICD-10-CM | POA: Diagnosis not present

## 2020-01-29 DIAGNOSIS — J910 Malignant pleural effusion: Secondary | ICD-10-CM | POA: Diagnosis not present

## 2020-01-29 LAB — CBC WITH DIFFERENTIAL/PLATELET
Abs Immature Granulocytes: 0.23 10*3/uL — ABNORMAL HIGH (ref 0.00–0.07)
Basophils Absolute: 0 10*3/uL (ref 0.0–0.1)
Basophils Relative: 0 %
Eosinophils Absolute: 0 10*3/uL (ref 0.0–0.5)
Eosinophils Relative: 0 %
HCT: 25.8 % — ABNORMAL LOW (ref 39.0–52.0)
Hemoglobin: 8 g/dL — ABNORMAL LOW (ref 13.0–17.0)
Immature Granulocytes: 2 %
Lymphocytes Relative: 5 %
Lymphs Abs: 0.6 10*3/uL — ABNORMAL LOW (ref 0.7–4.0)
MCH: 30.8 pg (ref 26.0–34.0)
MCHC: 31 g/dL (ref 30.0–36.0)
MCV: 99.2 fL (ref 80.0–100.0)
Monocytes Absolute: 0.7 10*3/uL (ref 0.1–1.0)
Monocytes Relative: 5 %
Neutro Abs: 10.8 10*3/uL — ABNORMAL HIGH (ref 1.7–7.7)
Neutrophils Relative %: 88 %
Platelets: 118 10*3/uL — ABNORMAL LOW (ref 150–400)
RBC: 2.6 MIL/uL — ABNORMAL LOW (ref 4.22–5.81)
RDW: 19.1 % — ABNORMAL HIGH (ref 11.5–15.5)
WBC: 12.3 10*3/uL — ABNORMAL HIGH (ref 4.0–10.5)
nRBC: 0 % (ref 0.0–0.2)

## 2020-01-29 LAB — BASIC METABOLIC PANEL
Anion gap: 13 (ref 5–15)
BUN: 22 mg/dL (ref 8–23)
CO2: 26 mmol/L (ref 22–32)
Calcium: 8.6 mg/dL — ABNORMAL LOW (ref 8.9–10.3)
Chloride: 97 mmol/L — ABNORMAL LOW (ref 98–111)
Creatinine, Ser: 0.77 mg/dL (ref 0.61–1.24)
GFR calc Af Amer: >60 mL/min (ref >60)
GFR calc non Af Amer: >60 mL/min (ref >60)
Glucose, Bld: 135 mg/dL — ABNORMAL HIGH (ref 70–99)
Potassium: 4.1 mmol/L (ref 3.5–5.1)
Sodium: 136 mmol/L (ref 135–145)

## 2020-01-29 LAB — PREPARE RBC (CROSSMATCH)

## 2020-01-29 NOTE — Telephone Encounter (Signed)
Patient was due to get T&S redone at [facility] today and then to get 2 units of blood tomorrow.  Upon wife getting patient ready she found he had a fever.  Most recently increased urination and some confusion.  She instead brought the patient to [facility] ER to be evaluated.  T&S and 2 units of blood will need to be reevaluated after discharge to determine if still required.

## 2020-01-29 NOTE — ED Triage Notes (Signed)
Pt with fever, no fever in triage.  Pt not able to answer questions.  Pt has right lung CA.  Pt on chemo.

## 2020-01-29 NOTE — Telephone Encounter (Signed)
Hgb 7.3-schedule message sent for 2 units of blood.

## 2020-01-30 ENCOUNTER — Telehealth: Payer: Self-pay | Admitting: *Deleted

## 2020-01-30 ENCOUNTER — Ambulatory Visit (HOSPITAL_COMMUNITY): Payer: Medicare Other

## 2020-01-30 ENCOUNTER — Other Ambulatory Visit: Payer: Self-pay | Admitting: Physician Assistant

## 2020-01-30 DIAGNOSIS — C3491 Malignant neoplasm of unspecified part of right bronchus or lung: Secondary | ICD-10-CM

## 2020-01-30 NOTE — Telephone Encounter (Signed)
Wife called to say Mr Jeremy [name] went to the ED at [facility] yesterday because he was running a fever and felt very bad. Waited in the ED for 5 hours, was not able to sit in chair for any longer and left. Appt for transfusion today at [facility] was canceled when he was in the ED. Did not receive blood today as scheduled. Hgb yesterday in ED was 8.0, he normally gets transfused if less than 8. Reviewed with Cassie Heilingoetter, PA. Jeremy [name] has appt for lab at [facility] on Wednesday, 9/1. Will add appt for possible transfusion at that time.   Wife verbalized understanding- will call if any changes. Jeremy [name] is afebrile today and is feeling much better according to Mrs [place].

## 2020-02-02 DIAGNOSIS — R300 Dysuria: Secondary | ICD-10-CM | POA: Diagnosis not present

## 2020-02-02 LAB — TYPE AND SCREEN
ABO/RH(D): A POS
Antibody Screen: NEGATIVE
Unit division: 0
Unit division: 0

## 2020-02-02 LAB — BPAM RBC
Blood Product Expiration Date: 202109162359
Blood Product Expiration Date: 202109162359
Unit Type and Rh: 6200
Unit Type and Rh: 6200

## 2020-02-03 ENCOUNTER — Other Ambulatory Visit: Payer: Self-pay

## 2020-02-03 DIAGNOSIS — D649 Anemia, unspecified: Secondary | ICD-10-CM

## 2020-02-04 ENCOUNTER — Inpatient Hospital Stay: Payer: Medicare Other

## 2020-02-04 ENCOUNTER — Inpatient Hospital Stay: Payer: Medicare Other | Attending: Internal Medicine

## 2020-02-04 ENCOUNTER — Other Ambulatory Visit: Payer: Self-pay | Admitting: Medical

## 2020-02-04 ENCOUNTER — Telehealth: Payer: Self-pay

## 2020-02-04 ENCOUNTER — Other Ambulatory Visit: Payer: Self-pay

## 2020-02-04 VITALS — BP 136/83 | HR 98 | Temp 98.9°F | Resp 18 | Ht 73.0 in

## 2020-02-04 DIAGNOSIS — D696 Thrombocytopenia, unspecified: Secondary | ICD-10-CM

## 2020-02-04 DIAGNOSIS — D649 Anemia, unspecified: Secondary | ICD-10-CM

## 2020-02-04 DIAGNOSIS — Z95828 Presence of other vascular implants and grafts: Secondary | ICD-10-CM

## 2020-02-04 DIAGNOSIS — C3491 Malignant neoplasm of unspecified part of right bronchus or lung: Secondary | ICD-10-CM | POA: Diagnosis not present

## 2020-02-04 DIAGNOSIS — D630 Anemia in neoplastic disease: Secondary | ICD-10-CM

## 2020-02-04 DIAGNOSIS — R531 Weakness: Secondary | ICD-10-CM | POA: Diagnosis not present

## 2020-02-04 DIAGNOSIS — D6481 Anemia due to antineoplastic chemotherapy: Secondary | ICD-10-CM

## 2020-02-04 LAB — CBC WITH DIFFERENTIAL (CANCER CENTER ONLY)
Abs Immature Granulocytes: 0.05 10*3/uL (ref 0.00–0.07)
Basophils Absolute: 0 10*3/uL (ref 0.0–0.1)
Basophils Relative: 0 %
Eosinophils Absolute: 0 10*3/uL (ref 0.0–0.5)
Eosinophils Relative: 1 %
HCT: 22.3 % — ABNORMAL LOW (ref 39.0–52.0)
Hemoglobin: 6.9 g/dL — CL (ref 13.0–17.0)
Immature Granulocytes: 1 %
Lymphocytes Relative: 10 %
Lymphs Abs: 0.8 10*3/uL (ref 0.7–4.0)
MCH: 29.9 pg (ref 26.0–34.0)
MCHC: 30.9 g/dL (ref 30.0–36.0)
MCV: 96.5 fL (ref 80.0–100.0)
Monocytes Absolute: 0.5 10*3/uL (ref 0.1–1.0)
Monocytes Relative: 6 %
Neutro Abs: 7.4 10*3/uL (ref 1.7–7.7)
Neutrophils Relative %: 82 %
Platelet Count: 102 10*3/uL — ABNORMAL LOW (ref 150–400)
RBC: 2.31 MIL/uL — ABNORMAL LOW (ref 4.22–5.81)
RDW: 18.6 % — ABNORMAL HIGH (ref 11.5–15.5)
WBC Count: 8.8 10*3/uL (ref 4.0–10.5)
nRBC: 0 % (ref 0.0–0.2)

## 2020-02-04 LAB — CMP (CANCER CENTER ONLY)
ALT: 16 U/L (ref 0–44)
AST: 11 U/L — ABNORMAL LOW (ref 15–41)
Albumin: 2.4 g/dL — ABNORMAL LOW (ref 3.5–5.0)
Alkaline Phosphatase: 99 U/L (ref 38–126)
Anion gap: 9 (ref 5–15)
BUN: 16 mg/dL (ref 8–23)
CO2: 27 mmol/L (ref 22–32)
Calcium: 9.5 mg/dL (ref 8.9–10.3)
Chloride: 101 mmol/L (ref 98–111)
Creatinine: 0.78 mg/dL (ref 0.61–1.24)
GFR, Est AFR Am: >60 mL/min (ref >60)
GFR, Estimated: >60 mL/min (ref >60)
Glucose, Bld: 126 mg/dL — ABNORMAL HIGH (ref 70–99)
Potassium: 4.3 mmol/L (ref 3.5–5.1)
Sodium: 137 mmol/L (ref 135–145)
Total Bilirubin: 0.4 mg/dL (ref 0.3–1.2)
Total Protein: 7.9 g/dL (ref 6.5–8.1)

## 2020-02-04 LAB — PREPARE RBC (CROSSMATCH)

## 2020-02-04 LAB — SAMPLE TO BLOOD BANK

## 2020-02-04 MED ORDER — ACETAMINOPHEN 325 MG PO TABS
ORAL_TABLET | ORAL | Status: AC
  Filled 2020-02-04: qty 2

## 2020-02-04 MED ORDER — SODIUM CHLORIDE 0.9% FLUSH
10.0000 mL | INTRAVENOUS | Status: DC | PRN
  Administered 2020-02-04: 10 mL
  Filled 2020-02-04: qty 10

## 2020-02-04 MED ORDER — DIPHENHYDRAMINE HCL 25 MG PO CAPS
ORAL_CAPSULE | ORAL | Status: AC
  Filled 2020-02-04: qty 1

## 2020-02-04 MED ORDER — HEPARIN SOD (PORK) LOCK FLUSH 100 UNIT/ML IV SOLN
500.0000 [IU] | Freq: Every day | INTRAVENOUS | Status: AC | PRN
  Administered 2020-02-04: 500 [IU]
  Filled 2020-02-04: qty 5

## 2020-02-04 MED ORDER — SODIUM CHLORIDE 0.9% IV SOLUTION
250.0000 mL | Freq: Once | INTRAVENOUS | Status: AC
  Administered 2020-02-04: 250 mL via INTRAVENOUS
  Filled 2020-02-04: qty 250

## 2020-02-04 MED ORDER — HEPARIN SOD (PORK) LOCK FLUSH 100 UNIT/ML IV SOLN
500.0000 [IU] | Freq: Once | INTRAVENOUS | Status: AC | PRN
  Filled 2020-02-04: qty 5

## 2020-02-04 MED ORDER — DIPHENHYDRAMINE HCL 25 MG PO CAPS
25.0000 mg | ORAL_CAPSULE | Freq: Once | ORAL | Status: AC
  Administered 2020-02-04: 25 mg via ORAL

## 2020-02-04 MED ORDER — ACETAMINOPHEN 325 MG PO TABS
650.0000 mg | ORAL_TABLET | Freq: Once | ORAL | Status: AC
  Administered 2020-02-04: 650 mg via ORAL

## 2020-02-04 NOTE — Telephone Encounter (Addendum)
CRITICAL VALUE STICKER  CRITICAL VALUE: HGB 6.7  RECEIVER (on-site recipient of call):Deaundra Kutzer [place] NOTIFIED: 02/04/20 1025  MESSENGER (representative from lab):  MD NOTIFIED: Cassie Heillingoetter PA  TIME OF NOTIFICATION: 5525  RESPONSE: Pt will receive 2 units of PRBC today

## 2020-02-04 NOTE — Patient Instructions (Signed)

## 2020-02-04 NOTE — Patient Instructions (Signed)

## 2020-02-05 DIAGNOSIS — Z961 Presence of intraocular lens: Secondary | ICD-10-CM | POA: Diagnosis not present

## 2020-02-05 DIAGNOSIS — H11422 Conjunctival edema, left eye: Secondary | ICD-10-CM | POA: Diagnosis not present

## 2020-02-05 DIAGNOSIS — H5712 Ocular pain, left eye: Secondary | ICD-10-CM | POA: Diagnosis not present

## 2020-02-05 DIAGNOSIS — H05222 Edema of left orbit: Secondary | ICD-10-CM | POA: Diagnosis not present

## 2020-02-05 LAB — TYPE AND SCREEN
ABO/RH(D): A POS
Antibody Screen: NEGATIVE
Unit division: 0
Unit division: 0

## 2020-02-05 LAB — BPAM RBC
Blood Product Expiration Date: 202109232359
Blood Product Expiration Date: 202109232359
ISSUE DATE / TIME: 202109011136
ISSUE DATE / TIME: 202109011136
Unit Type and Rh: 6200
Unit Type and Rh: 6200

## 2020-02-06 ENCOUNTER — Other Ambulatory Visit: Payer: Self-pay

## 2020-02-06 ENCOUNTER — Encounter (HOSPITAL_COMMUNITY): Payer: Self-pay

## 2020-02-06 ENCOUNTER — Ambulatory Visit (HOSPITAL_COMMUNITY)
Admission: RE | Admit: 2020-02-06 | Discharge: 2020-02-06 | Disposition: A | Discharge status: Home / Self Care | Payer: Medicare Other | Source: Ambulatory Visit | Attending: Internal Medicine | Admitting: Internal Medicine

## 2020-02-06 DIAGNOSIS — I700 Atherosclerosis of aorta: Secondary | ICD-10-CM | POA: Diagnosis not present

## 2020-02-06 DIAGNOSIS — R7881 Bacteremia: Secondary | ICD-10-CM | POA: Diagnosis not present

## 2020-02-06 DIAGNOSIS — H4419 Other endophthalmitis: Secondary | ICD-10-CM | POA: Diagnosis not present

## 2020-02-06 DIAGNOSIS — H44002 Unspecified purulent endophthalmitis, left eye: Secondary | ICD-10-CM | POA: Diagnosis not present

## 2020-02-06 DIAGNOSIS — J9611 Chronic respiratory failure with hypoxia: Secondary | ICD-10-CM | POA: Diagnosis not present

## 2020-02-06 DIAGNOSIS — C3490 Malignant neoplasm of unspecified part of unspecified bronchus or lung: Secondary | ICD-10-CM | POA: Diagnosis not present

## 2020-02-06 DIAGNOSIS — T80211A Bloodstream infection due to central venous catheter, initial encounter: Secondary | ICD-10-CM | POA: Diagnosis not present

## 2020-02-06 DIAGNOSIS — R000 Tachycardia, unspecified: Secondary | ICD-10-CM | POA: Diagnosis not present

## 2020-02-06 DIAGNOSIS — J181 Lobar pneumonia, unspecified organism: Secondary | ICD-10-CM | POA: Diagnosis not present

## 2020-02-06 DIAGNOSIS — R509 Fever, unspecified: Secondary | ICD-10-CM | POA: Diagnosis not present

## 2020-02-06 DIAGNOSIS — J869 Pyothorax without fistula: Secondary | ICD-10-CM | POA: Diagnosis not present

## 2020-02-06 DIAGNOSIS — Z961 Presence of intraocular lens: Secondary | ICD-10-CM | POA: Diagnosis not present

## 2020-02-06 DIAGNOSIS — H4323 Crystalline deposits in vitreous body, bilateral: Secondary | ICD-10-CM | POA: Diagnosis not present

## 2020-02-06 DIAGNOSIS — C3431 Malignant neoplasm of lower lobe, right bronchus or lung: Secondary | ICD-10-CM | POA: Diagnosis not present

## 2020-02-06 DIAGNOSIS — H109 Unspecified conjunctivitis: Secondary | ICD-10-CM | POA: Diagnosis not present

## 2020-02-06 DIAGNOSIS — Z20822 Contact with and (suspected) exposure to covid-19: Secondary | ICD-10-CM | POA: Diagnosis not present

## 2020-02-06 MED ORDER — IOHEXOL 300 MG/ML  SOLN
100.0000 mL | Freq: Once | INTRAMUSCULAR | Status: AC | PRN
  Administered 2020-02-06: 100 mL via INTRAVENOUS

## 2020-02-06 MED ORDER — HEPARIN SOD (PORK) LOCK FLUSH 100 UNIT/ML IV SOLN
INTRAVENOUS | Status: AC
  Filled 2020-02-06: qty 5

## 2020-02-06 MED ORDER — HEPARIN SOD (PORK) LOCK FLUSH 100 UNIT/ML IV SOLN
500.0000 [IU] | Freq: Once | INTRAVENOUS | Status: AC
  Administered 2020-02-06: 500 [IU] via INTRAVENOUS

## 2020-02-07 DIAGNOSIS — J984 Other disorders of lung: Secondary | ICD-10-CM | POA: Diagnosis not present

## 2020-02-07 DIAGNOSIS — R509 Fever, unspecified: Secondary | ICD-10-CM | POA: Diagnosis not present

## 2020-02-07 DIAGNOSIS — R9389 Abnormal findings on diagnostic imaging of other specified body structures: Secondary | ICD-10-CM | POA: Diagnosis not present

## 2020-02-07 DIAGNOSIS — J449 Chronic obstructive pulmonary disease, unspecified: Secondary | ICD-10-CM | POA: Diagnosis not present

## 2020-02-07 DIAGNOSIS — T8579XA Infection and inflammatory reaction due to other internal prosthetic devices, implants and grafts, initial encounter: Secondary | ICD-10-CM | POA: Diagnosis not present

## 2020-02-07 DIAGNOSIS — Z20822 Contact with and (suspected) exposure to covid-19: Secondary | ICD-10-CM | POA: Diagnosis present

## 2020-02-07 DIAGNOSIS — A4901 Methicillin susceptible Staphylococcus aureus infection, unspecified site: Secondary | ICD-10-CM | POA: Diagnosis not present

## 2020-02-07 DIAGNOSIS — T451X5A Adverse effect of antineoplastic and immunosuppressive drugs, initial encounter: Secondary | ICD-10-CM | POA: Diagnosis present

## 2020-02-07 DIAGNOSIS — I480 Paroxysmal atrial fibrillation: Secondary | ICD-10-CM | POA: Diagnosis present

## 2020-02-07 DIAGNOSIS — R7881 Bacteremia: Secondary | ICD-10-CM | POA: Diagnosis not present

## 2020-02-07 DIAGNOSIS — J869 Pyothorax without fistula: Secondary | ICD-10-CM | POA: Diagnosis not present

## 2020-02-07 DIAGNOSIS — Z452 Encounter for adjustment and management of vascular access device: Secondary | ICD-10-CM | POA: Diagnosis not present

## 2020-02-07 DIAGNOSIS — H44009 Unspecified purulent endophthalmitis, unspecified eye: Secondary | ICD-10-CM | POA: Diagnosis not present

## 2020-02-07 DIAGNOSIS — B9561 Methicillin susceptible Staphylococcus aureus infection as the cause of diseases classified elsewhere: Secondary | ICD-10-CM | POA: Diagnosis not present

## 2020-02-07 DIAGNOSIS — Z9689 Presence of other specified functional implants: Secondary | ICD-10-CM | POA: Diagnosis not present

## 2020-02-07 DIAGNOSIS — D6481 Anemia due to antineoplastic chemotherapy: Secondary | ICD-10-CM | POA: Diagnosis present

## 2020-02-07 DIAGNOSIS — R627 Adult failure to thrive: Secondary | ICD-10-CM | POA: Diagnosis present

## 2020-02-07 DIAGNOSIS — C3431 Malignant neoplasm of lower lobe, right bronchus or lung: Secondary | ICD-10-CM | POA: Diagnosis present

## 2020-02-07 DIAGNOSIS — L4050 Arthropathic psoriasis, unspecified: Secondary | ICD-10-CM | POA: Diagnosis present

## 2020-02-07 DIAGNOSIS — H109 Unspecified conjunctivitis: Secondary | ICD-10-CM | POA: Diagnosis not present

## 2020-02-07 DIAGNOSIS — A419 Sepsis, unspecified organism: Secondary | ICD-10-CM | POA: Diagnosis not present

## 2020-02-07 DIAGNOSIS — I358 Other nonrheumatic aortic valve disorders: Secondary | ICD-10-CM | POA: Diagnosis not present

## 2020-02-07 DIAGNOSIS — I361 Nonrheumatic tricuspid (valve) insufficiency: Secondary | ICD-10-CM | POA: Diagnosis not present

## 2020-02-07 DIAGNOSIS — R918 Other nonspecific abnormal finding of lung field: Secondary | ICD-10-CM | POA: Diagnosis not present

## 2020-02-07 DIAGNOSIS — J910 Malignant pleural effusion: Secondary | ICD-10-CM | POA: Diagnosis present

## 2020-02-07 DIAGNOSIS — C3490 Malignant neoplasm of unspecified part of unspecified bronchus or lung: Secondary | ICD-10-CM | POA: Diagnosis not present

## 2020-02-07 DIAGNOSIS — H44002 Unspecified purulent endophthalmitis, left eye: Secondary | ICD-10-CM | POA: Diagnosis present

## 2020-02-07 DIAGNOSIS — K219 Gastro-esophageal reflux disease without esophagitis: Secondary | ICD-10-CM | POA: Diagnosis present

## 2020-02-07 DIAGNOSIS — J9611 Chronic respiratory failure with hypoxia: Secondary | ICD-10-CM | POA: Diagnosis present

## 2020-02-07 DIAGNOSIS — J439 Emphysema, unspecified: Secondary | ICD-10-CM | POA: Diagnosis present

## 2020-02-07 DIAGNOSIS — T80211A Bloodstream infection due to central venous catheter, initial encounter: Secondary | ICD-10-CM | POA: Diagnosis present

## 2020-02-07 DIAGNOSIS — I4891 Unspecified atrial fibrillation: Secondary | ICD-10-CM | POA: Diagnosis present

## 2020-02-07 DIAGNOSIS — Z961 Presence of intraocular lens: Secondary | ICD-10-CM | POA: Diagnosis present

## 2020-02-07 DIAGNOSIS — R339 Retention of urine, unspecified: Secondary | ICD-10-CM | POA: Diagnosis present

## 2020-02-07 DIAGNOSIS — J90 Pleural effusion, not elsewhere classified: Secondary | ICD-10-CM | POA: Diagnosis not present

## 2020-02-07 DIAGNOSIS — E1151 Type 2 diabetes mellitus with diabetic peripheral angiopathy without gangrene: Secondary | ICD-10-CM | POA: Diagnosis present

## 2020-02-07 DIAGNOSIS — Z6821 Body mass index (BMI) 21.0-21.9, adult: Secondary | ICD-10-CM | POA: Diagnosis not present

## 2020-02-07 DIAGNOSIS — K5900 Constipation, unspecified: Secondary | ICD-10-CM | POA: Diagnosis present

## 2020-02-07 DIAGNOSIS — E1142 Type 2 diabetes mellitus with diabetic polyneuropathy: Secondary | ICD-10-CM | POA: Diagnosis present

## 2020-02-07 DIAGNOSIS — G8929 Other chronic pain: Secondary | ICD-10-CM | POA: Diagnosis present

## 2020-02-07 DIAGNOSIS — R000 Tachycardia, unspecified: Secondary | ICD-10-CM | POA: Diagnosis not present

## 2020-02-07 DIAGNOSIS — J948 Other specified pleural conditions: Secondary | ICD-10-CM | POA: Diagnosis not present

## 2020-02-07 DIAGNOSIS — I351 Nonrheumatic aortic (valve) insufficiency: Secondary | ICD-10-CM | POA: Diagnosis not present

## 2020-02-07 DIAGNOSIS — H4419 Other endophthalmitis: Secondary | ICD-10-CM | POA: Diagnosis not present

## 2020-02-07 DIAGNOSIS — R32 Unspecified urinary incontinence: Secondary | ICD-10-CM | POA: Diagnosis present

## 2020-02-07 DIAGNOSIS — Q211 Atrial septal defect: Secondary | ICD-10-CM | POA: Diagnosis not present

## 2020-02-10 ENCOUNTER — Telehealth: Payer: Self-pay | Admitting: Medical Oncology

## 2020-02-10 ENCOUNTER — Other Ambulatory Visit: Payer: Self-pay | Admitting: Physician Assistant

## 2020-02-10 DIAGNOSIS — C3491 Malignant neoplasm of unspecified part of right bronchus or lung: Secondary | ICD-10-CM

## 2020-02-10 NOTE — Telephone Encounter (Signed)
He may need to have someone look at his scan of the chest and the suspicious hydropneumothorax.  Please let the wife knows so she can ask them.

## 2020-02-10 NOTE — Telephone Encounter (Signed)
Jeremy [name] is  admitted to [facility] for staph infection of his eye. Appts cancelled for this week per wife request.

## 2020-02-11 ENCOUNTER — Inpatient Hospital Stay: Payer: Medicare Other

## 2020-02-11 ENCOUNTER — Inpatient Hospital Stay: Payer: Medicare Other | Admitting: Internal Medicine

## 2020-02-11 ENCOUNTER — Inpatient Hospital Stay: Payer: Medicare Other | Admitting: Nutrition

## 2020-02-11 NOTE — Telephone Encounter (Signed)
The provider at [facility] is aware of CT scan results and pt had a chest tube inserted last night.

## 2020-02-13 ENCOUNTER — Inpatient Hospital Stay: Payer: Medicare Other

## 2020-02-20 ENCOUNTER — Telehealth: Payer: Self-pay | Admitting: Internal Medicine

## 2020-02-20 NOTE — Telephone Encounter (Signed)
R/s appt per 9/17 sch msg - unable to reach pt - left message for patient with appt and date

## 2020-02-21 DIAGNOSIS — Z792 Long term (current) use of antibiotics: Secondary | ICD-10-CM | POA: Diagnosis not present

## 2020-02-21 DIAGNOSIS — Z79891 Long term (current) use of opiate analgesic: Secondary | ICD-10-CM | POA: Diagnosis not present

## 2020-02-21 DIAGNOSIS — Z452 Encounter for adjustment and management of vascular access device: Secondary | ICD-10-CM | POA: Diagnosis not present

## 2020-02-21 DIAGNOSIS — D630 Anemia in neoplastic disease: Secondary | ICD-10-CM | POA: Diagnosis not present

## 2020-02-21 DIAGNOSIS — J869 Pyothorax without fistula: Secondary | ICD-10-CM | POA: Diagnosis not present

## 2020-02-21 DIAGNOSIS — J449 Chronic obstructive pulmonary disease, unspecified: Secondary | ICD-10-CM | POA: Diagnosis not present

## 2020-02-21 DIAGNOSIS — C3431 Malignant neoplasm of lower lobe, right bronchus or lung: Secondary | ICD-10-CM | POA: Diagnosis not present

## 2020-02-21 DIAGNOSIS — J9611 Chronic respiratory failure with hypoxia: Secondary | ICD-10-CM | POA: Diagnosis not present

## 2020-02-21 DIAGNOSIS — K5900 Constipation, unspecified: Secondary | ICD-10-CM | POA: Diagnosis not present

## 2020-02-21 DIAGNOSIS — Z9981 Dependence on supplemental oxygen: Secondary | ICD-10-CM | POA: Diagnosis not present

## 2020-02-21 DIAGNOSIS — R627 Adult failure to thrive: Secondary | ICD-10-CM | POA: Diagnosis not present

## 2020-02-21 DIAGNOSIS — G8929 Other chronic pain: Secondary | ICD-10-CM | POA: Diagnosis not present

## 2020-02-21 DIAGNOSIS — R7881 Bacteremia: Secondary | ICD-10-CM | POA: Diagnosis not present

## 2020-02-21 DIAGNOSIS — Z5181 Encounter for therapeutic drug level monitoring: Secondary | ICD-10-CM | POA: Diagnosis not present

## 2020-02-21 DIAGNOSIS — J910 Malignant pleural effusion: Secondary | ICD-10-CM | POA: Diagnosis not present

## 2020-02-21 DIAGNOSIS — B9561 Methicillin susceptible Staphylococcus aureus infection as the cause of diseases classified elsewhere: Secondary | ICD-10-CM | POA: Diagnosis not present

## 2020-02-21 DIAGNOSIS — L4050 Arthropathic psoriasis, unspecified: Secondary | ICD-10-CM | POA: Diagnosis not present

## 2020-02-21 DIAGNOSIS — Z7984 Long term (current) use of oral hypoglycemic drugs: Secondary | ICD-10-CM | POA: Diagnosis not present

## 2020-02-21 DIAGNOSIS — H44002 Unspecified purulent endophthalmitis, left eye: Secondary | ICD-10-CM | POA: Diagnosis not present

## 2020-02-21 DIAGNOSIS — I480 Paroxysmal atrial fibrillation: Secondary | ICD-10-CM | POA: Diagnosis not present

## 2020-02-21 DIAGNOSIS — R3915 Urgency of urination: Secondary | ICD-10-CM | POA: Diagnosis not present

## 2020-02-21 DIAGNOSIS — Z7951 Long term (current) use of inhaled steroids: Secondary | ICD-10-CM | POA: Diagnosis not present

## 2020-02-21 DIAGNOSIS — E1142 Type 2 diabetes mellitus with diabetic polyneuropathy: Secondary | ICD-10-CM | POA: Diagnosis not present

## 2020-02-21 DIAGNOSIS — R32 Unspecified urinary incontinence: Secondary | ICD-10-CM | POA: Diagnosis not present

## 2020-02-23 DIAGNOSIS — H43392 Other vitreous opacities, left eye: Secondary | ICD-10-CM | POA: Diagnosis not present

## 2020-02-23 DIAGNOSIS — L4050 Arthropathic psoriasis, unspecified: Secondary | ICD-10-CM | POA: Diagnosis not present

## 2020-02-23 DIAGNOSIS — C3490 Malignant neoplasm of unspecified part of unspecified bronchus or lung: Secondary | ICD-10-CM | POA: Diagnosis not present

## 2020-02-23 DIAGNOSIS — J869 Pyothorax without fistula: Secondary | ICD-10-CM | POA: Diagnosis not present

## 2020-02-23 DIAGNOSIS — C3431 Malignant neoplasm of lower lobe, right bronchus or lung: Secondary | ICD-10-CM | POA: Diagnosis not present

## 2020-02-23 DIAGNOSIS — A419 Sepsis, unspecified organism: Secondary | ICD-10-CM | POA: Diagnosis not present

## 2020-02-23 DIAGNOSIS — Z8619 Personal history of other infectious and parasitic diseases: Secondary | ICD-10-CM | POA: Diagnosis not present

## 2020-02-23 DIAGNOSIS — Z452 Encounter for adjustment and management of vascular access device: Secondary | ICD-10-CM | POA: Diagnosis not present

## 2020-02-23 DIAGNOSIS — Z87440 Personal history of urinary (tract) infections: Secondary | ICD-10-CM | POA: Diagnosis not present

## 2020-02-23 DIAGNOSIS — Z8701 Personal history of pneumonia (recurrent): Secondary | ICD-10-CM | POA: Diagnosis not present

## 2020-02-23 DIAGNOSIS — B9561 Methicillin susceptible Staphylococcus aureus infection as the cause of diseases classified elsewhere: Secondary | ICD-10-CM | POA: Diagnosis not present

## 2020-02-23 DIAGNOSIS — Z95828 Presence of other vascular implants and grafts: Secondary | ICD-10-CM | POA: Diagnosis not present

## 2020-02-23 DIAGNOSIS — R7881 Bacteremia: Secondary | ICD-10-CM | POA: Diagnosis not present

## 2020-02-23 DIAGNOSIS — Z79899 Other long term (current) drug therapy: Secondary | ICD-10-CM | POA: Diagnosis not present

## 2020-02-23 DIAGNOSIS — H44002 Unspecified purulent endophthalmitis, left eye: Secondary | ICD-10-CM | POA: Diagnosis not present

## 2020-02-23 DIAGNOSIS — Z961 Presence of intraocular lens: Secondary | ICD-10-CM | POA: Diagnosis not present

## 2020-02-24 ENCOUNTER — Telehealth: Payer: Self-pay | Admitting: Medical Oncology

## 2020-02-24 NOTE — Telephone Encounter (Signed)
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  He also has an eye infection . He has upcoming eye surgery for Endophthalmitis OS .    Per Dr Julien Nordmann , I told [name] that I cancelled his  appts at the cancer center for this month and asked her to call back for an appt  after Jeremy [name] completes his antibiotics.

## 2020-02-25 DIAGNOSIS — H44002 Unspecified purulent endophthalmitis, left eye: Secondary | ICD-10-CM | POA: Diagnosis not present

## 2020-02-25 DIAGNOSIS — R7881 Bacteremia: Secondary | ICD-10-CM | POA: Diagnosis not present

## 2020-02-25 DIAGNOSIS — C3431 Malignant neoplasm of lower lobe, right bronchus or lung: Secondary | ICD-10-CM | POA: Diagnosis not present

## 2020-02-25 DIAGNOSIS — J869 Pyothorax without fistula: Secondary | ICD-10-CM | POA: Diagnosis not present

## 2020-02-25 DIAGNOSIS — B9561 Methicillin susceptible Staphylococcus aureus infection as the cause of diseases classified elsewhere: Secondary | ICD-10-CM | POA: Diagnosis not present

## 2020-02-25 DIAGNOSIS — Z452 Encounter for adjustment and management of vascular access device: Secondary | ICD-10-CM | POA: Diagnosis not present

## 2020-02-26 DIAGNOSIS — H44002 Unspecified purulent endophthalmitis, left eye: Secondary | ICD-10-CM | POA: Diagnosis not present

## 2020-02-26 DIAGNOSIS — Z7984 Long term (current) use of oral hypoglycemic drugs: Secondary | ICD-10-CM | POA: Diagnosis not present

## 2020-02-26 DIAGNOSIS — I480 Paroxysmal atrial fibrillation: Secondary | ICD-10-CM | POA: Diagnosis not present

## 2020-02-26 DIAGNOSIS — H33002 Unspecified retinal detachment with retinal break, left eye: Secondary | ICD-10-CM | POA: Diagnosis not present

## 2020-02-26 DIAGNOSIS — K219 Gastro-esophageal reflux disease without esophagitis: Secondary | ICD-10-CM | POA: Diagnosis not present

## 2020-02-26 DIAGNOSIS — Z85118 Personal history of other malignant neoplasm of bronchus and lung: Secondary | ICD-10-CM | POA: Diagnosis not present

## 2020-02-26 DIAGNOSIS — E119 Type 2 diabetes mellitus without complications: Secondary | ICD-10-CM | POA: Diagnosis not present

## 2020-02-26 DIAGNOSIS — Z87891 Personal history of nicotine dependence: Secondary | ICD-10-CM | POA: Diagnosis not present

## 2020-02-26 DIAGNOSIS — D630 Anemia in neoplastic disease: Secondary | ICD-10-CM | POA: Diagnosis not present

## 2020-02-26 DIAGNOSIS — J439 Emphysema, unspecified: Secondary | ICD-10-CM | POA: Diagnosis not present

## 2020-02-26 DIAGNOSIS — Z79899 Other long term (current) drug therapy: Secondary | ICD-10-CM | POA: Diagnosis not present

## 2020-02-27 DIAGNOSIS — H44002 Unspecified purulent endophthalmitis, left eye: Secondary | ICD-10-CM | POA: Diagnosis not present

## 2020-02-27 DIAGNOSIS — H33012 Retinal detachment with single break, left eye: Secondary | ICD-10-CM | POA: Diagnosis not present

## 2020-02-27 DIAGNOSIS — D630 Anemia in neoplastic disease: Secondary | ICD-10-CM | POA: Diagnosis not present

## 2020-02-27 DIAGNOSIS — E119 Type 2 diabetes mellitus without complications: Secondary | ICD-10-CM | POA: Diagnosis not present

## 2020-02-27 DIAGNOSIS — I480 Paroxysmal atrial fibrillation: Secondary | ICD-10-CM | POA: Diagnosis not present

## 2020-02-27 DIAGNOSIS — H33002 Unspecified retinal detachment with retinal break, left eye: Secondary | ICD-10-CM | POA: Diagnosis not present

## 2020-02-27 DIAGNOSIS — K219 Gastro-esophageal reflux disease without esophagitis: Secondary | ICD-10-CM | POA: Diagnosis not present

## 2020-03-01 DIAGNOSIS — B9561 Methicillin susceptible Staphylococcus aureus infection as the cause of diseases classified elsewhere: Secondary | ICD-10-CM | POA: Diagnosis not present

## 2020-03-01 DIAGNOSIS — R918 Other nonspecific abnormal finding of lung field: Secondary | ICD-10-CM | POA: Diagnosis not present

## 2020-03-01 DIAGNOSIS — R7881 Bacteremia: Secondary | ICD-10-CM | POA: Diagnosis not present

## 2020-03-01 DIAGNOSIS — J90 Pleural effusion, not elsewhere classified: Secondary | ICD-10-CM | POA: Diagnosis not present

## 2020-03-01 DIAGNOSIS — Z792 Long term (current) use of antibiotics: Secondary | ICD-10-CM | POA: Diagnosis not present

## 2020-03-01 DIAGNOSIS — C3490 Malignant neoplasm of unspecified part of unspecified bronchus or lung: Secondary | ICD-10-CM | POA: Diagnosis not present

## 2020-03-01 DIAGNOSIS — J984 Other disorders of lung: Secondary | ICD-10-CM | POA: Diagnosis not present

## 2020-03-01 DIAGNOSIS — C3431 Malignant neoplasm of lower lobe, right bronchus or lung: Secondary | ICD-10-CM | POA: Diagnosis not present

## 2020-03-01 DIAGNOSIS — Z23 Encounter for immunization: Secondary | ICD-10-CM | POA: Diagnosis not present

## 2020-03-01 DIAGNOSIS — Z452 Encounter for adjustment and management of vascular access device: Secondary | ICD-10-CM | POA: Diagnosis not present

## 2020-03-01 DIAGNOSIS — J869 Pyothorax without fistula: Secondary | ICD-10-CM | POA: Diagnosis not present

## 2020-03-01 DIAGNOSIS — H44002 Unspecified purulent endophthalmitis, left eye: Secondary | ICD-10-CM | POA: Diagnosis not present

## 2020-03-02 ENCOUNTER — Inpatient Hospital Stay: Payer: Medicare Other

## 2020-03-02 ENCOUNTER — Inpatient Hospital Stay: Payer: Medicare Other | Admitting: Internal Medicine

## 2020-03-02 DIAGNOSIS — B9561 Methicillin susceptible Staphylococcus aureus infection as the cause of diseases classified elsewhere: Secondary | ICD-10-CM | POA: Diagnosis not present

## 2020-03-02 DIAGNOSIS — J869 Pyothorax without fistula: Secondary | ICD-10-CM | POA: Diagnosis not present

## 2020-03-02 DIAGNOSIS — Z452 Encounter for adjustment and management of vascular access device: Secondary | ICD-10-CM | POA: Diagnosis not present

## 2020-03-02 DIAGNOSIS — C3431 Malignant neoplasm of lower lobe, right bronchus or lung: Secondary | ICD-10-CM | POA: Diagnosis not present

## 2020-03-02 DIAGNOSIS — H44002 Unspecified purulent endophthalmitis, left eye: Secondary | ICD-10-CM | POA: Diagnosis not present

## 2020-03-02 DIAGNOSIS — R7881 Bacteremia: Secondary | ICD-10-CM | POA: Diagnosis not present

## 2020-03-04 ENCOUNTER — Inpatient Hospital Stay: Payer: Medicare Other

## 2020-03-04 DIAGNOSIS — J869 Pyothorax without fistula: Secondary | ICD-10-CM | POA: Diagnosis not present

## 2020-03-04 DIAGNOSIS — Z452 Encounter for adjustment and management of vascular access device: Secondary | ICD-10-CM | POA: Diagnosis not present

## 2020-03-04 DIAGNOSIS — R7881 Bacteremia: Secondary | ICD-10-CM | POA: Diagnosis not present

## 2020-03-04 DIAGNOSIS — H44002 Unspecified purulent endophthalmitis, left eye: Secondary | ICD-10-CM | POA: Diagnosis not present

## 2020-03-04 DIAGNOSIS — Z48810 Encounter for surgical aftercare following surgery on the sense organs: Secondary | ICD-10-CM | POA: Diagnosis not present

## 2020-03-04 DIAGNOSIS — Z9889 Other specified postprocedural states: Secondary | ICD-10-CM | POA: Diagnosis not present

## 2020-03-04 DIAGNOSIS — B9561 Methicillin susceptible Staphylococcus aureus infection as the cause of diseases classified elsewhere: Secondary | ICD-10-CM | POA: Diagnosis not present

## 2020-03-04 DIAGNOSIS — C3431 Malignant neoplasm of lower lobe, right bronchus or lung: Secondary | ICD-10-CM | POA: Diagnosis not present

## 2020-03-08 DIAGNOSIS — B9561 Methicillin susceptible Staphylococcus aureus infection as the cause of diseases classified elsewhere: Secondary | ICD-10-CM | POA: Diagnosis not present

## 2020-03-08 DIAGNOSIS — H44002 Unspecified purulent endophthalmitis, left eye: Secondary | ICD-10-CM | POA: Diagnosis not present

## 2020-03-08 DIAGNOSIS — C3431 Malignant neoplasm of lower lobe, right bronchus or lung: Secondary | ICD-10-CM | POA: Diagnosis not present

## 2020-03-08 DIAGNOSIS — R7881 Bacteremia: Secondary | ICD-10-CM | POA: Diagnosis not present

## 2020-03-08 DIAGNOSIS — A419 Sepsis, unspecified organism: Secondary | ICD-10-CM | POA: Diagnosis not present

## 2020-03-08 DIAGNOSIS — J869 Pyothorax without fistula: Secondary | ICD-10-CM | POA: Diagnosis not present

## 2020-03-08 DIAGNOSIS — Z452 Encounter for adjustment and management of vascular access device: Secondary | ICD-10-CM | POA: Diagnosis not present

## 2020-03-09 DIAGNOSIS — H44002 Unspecified purulent endophthalmitis, left eye: Secondary | ICD-10-CM | POA: Diagnosis not present

## 2020-03-09 DIAGNOSIS — C3431 Malignant neoplasm of lower lobe, right bronchus or lung: Secondary | ICD-10-CM | POA: Diagnosis not present

## 2020-03-09 DIAGNOSIS — J869 Pyothorax without fistula: Secondary | ICD-10-CM | POA: Diagnosis not present

## 2020-03-09 DIAGNOSIS — B9561 Methicillin susceptible Staphylococcus aureus infection as the cause of diseases classified elsewhere: Secondary | ICD-10-CM | POA: Diagnosis not present

## 2020-03-09 DIAGNOSIS — R7881 Bacteremia: Secondary | ICD-10-CM | POA: Diagnosis not present

## 2020-03-09 DIAGNOSIS — Z452 Encounter for adjustment and management of vascular access device: Secondary | ICD-10-CM | POA: Diagnosis not present

## 2020-03-11 DIAGNOSIS — E875 Hyperkalemia: Secondary | ICD-10-CM | POA: Diagnosis not present

## 2020-03-11 DIAGNOSIS — B9561 Methicillin susceptible Staphylococcus aureus infection as the cause of diseases classified elsewhere: Secondary | ICD-10-CM | POA: Diagnosis not present

## 2020-03-11 DIAGNOSIS — C3431 Malignant neoplasm of lower lobe, right bronchus or lung: Secondary | ICD-10-CM | POA: Diagnosis not present

## 2020-03-11 DIAGNOSIS — H44002 Unspecified purulent endophthalmitis, left eye: Secondary | ICD-10-CM | POA: Diagnosis not present

## 2020-03-11 DIAGNOSIS — Z452 Encounter for adjustment and management of vascular access device: Secondary | ICD-10-CM | POA: Diagnosis not present

## 2020-03-11 DIAGNOSIS — J869 Pyothorax without fistula: Secondary | ICD-10-CM | POA: Diagnosis not present

## 2020-03-11 DIAGNOSIS — R7881 Bacteremia: Secondary | ICD-10-CM | POA: Diagnosis not present

## 2020-03-15 ENCOUNTER — Other Ambulatory Visit: Payer: Self-pay | Admitting: *Deleted

## 2020-03-15 DIAGNOSIS — A419 Sepsis, unspecified organism: Secondary | ICD-10-CM | POA: Diagnosis not present

## 2020-03-15 DIAGNOSIS — C3431 Malignant neoplasm of lower lobe, right bronchus or lung: Secondary | ICD-10-CM | POA: Diagnosis not present

## 2020-03-15 DIAGNOSIS — D630 Anemia in neoplastic disease: Secondary | ICD-10-CM | POA: Diagnosis not present

## 2020-03-15 DIAGNOSIS — J869 Pyothorax without fistula: Secondary | ICD-10-CM | POA: Diagnosis not present

## 2020-03-15 DIAGNOSIS — H44002 Unspecified purulent endophthalmitis, left eye: Secondary | ICD-10-CM | POA: Diagnosis not present

## 2020-03-15 DIAGNOSIS — R7881 Bacteremia: Secondary | ICD-10-CM | POA: Diagnosis not present

## 2020-03-15 DIAGNOSIS — Z452 Encounter for adjustment and management of vascular access device: Secondary | ICD-10-CM | POA: Diagnosis not present

## 2020-03-15 DIAGNOSIS — B9561 Methicillin susceptible Staphylococcus aureus infection as the cause of diseases classified elsewhere: Secondary | ICD-10-CM | POA: Diagnosis not present

## 2020-03-15 DIAGNOSIS — J910 Malignant pleural effusion: Secondary | ICD-10-CM | POA: Diagnosis not present

## 2020-03-15 MED ORDER — DILTIAZEM HCL ER COATED BEADS 360 MG PO CP24: 360.0000 mg | ORAL_CAPSULE | Freq: Every day | ORAL | 6 refills | Status: DC

## 2020-03-16 DIAGNOSIS — J984 Other disorders of lung: Secondary | ICD-10-CM | POA: Diagnosis not present

## 2020-03-16 DIAGNOSIS — J90 Pleural effusion, not elsewhere classified: Secondary | ICD-10-CM | POA: Diagnosis not present

## 2020-03-17 DIAGNOSIS — C3490 Malignant neoplasm of unspecified part of unspecified bronchus or lung: Secondary | ICD-10-CM | POA: Diagnosis not present

## 2020-03-17 DIAGNOSIS — J90 Pleural effusion, not elsewhere classified: Secondary | ICD-10-CM | POA: Diagnosis not present

## 2020-03-17 DIAGNOSIS — Z792 Long term (current) use of antibiotics: Secondary | ICD-10-CM | POA: Diagnosis not present

## 2020-03-17 DIAGNOSIS — R918 Other nonspecific abnormal finding of lung field: Secondary | ICD-10-CM | POA: Diagnosis not present

## 2020-03-17 DIAGNOSIS — Z888 Allergy status to other drugs, medicaments and biological substances status: Secondary | ICD-10-CM | POA: Diagnosis not present

## 2020-03-17 DIAGNOSIS — H44002 Unspecified purulent endophthalmitis, left eye: Secondary | ICD-10-CM | POA: Diagnosis not present

## 2020-03-17 DIAGNOSIS — Z886 Allergy status to analgesic agent status: Secondary | ICD-10-CM | POA: Diagnosis not present

## 2020-03-17 DIAGNOSIS — R7881 Bacteremia: Secondary | ICD-10-CM | POA: Diagnosis not present

## 2020-03-17 DIAGNOSIS — R768 Other specified abnormal immunological findings in serum: Secondary | ICD-10-CM | POA: Diagnosis not present

## 2020-03-17 DIAGNOSIS — J869 Pyothorax without fistula: Secondary | ICD-10-CM | POA: Diagnosis not present

## 2020-03-17 DIAGNOSIS — B9561 Methicillin susceptible Staphylococcus aureus infection as the cause of diseases classified elsewhere: Secondary | ICD-10-CM | POA: Diagnosis not present

## 2020-03-22 DIAGNOSIS — R7881 Bacteremia: Secondary | ICD-10-CM | POA: Diagnosis not present

## 2020-03-22 DIAGNOSIS — R32 Unspecified urinary incontinence: Secondary | ICD-10-CM | POA: Diagnosis not present

## 2020-03-22 DIAGNOSIS — B9561 Methicillin susceptible Staphylococcus aureus infection as the cause of diseases classified elsewhere: Secondary | ICD-10-CM | POA: Diagnosis not present

## 2020-03-22 DIAGNOSIS — D630 Anemia in neoplastic disease: Secondary | ICD-10-CM | POA: Diagnosis not present

## 2020-03-22 DIAGNOSIS — J9611 Chronic respiratory failure with hypoxia: Secondary | ICD-10-CM | POA: Diagnosis not present

## 2020-03-22 DIAGNOSIS — Z7984 Long term (current) use of oral hypoglycemic drugs: Secondary | ICD-10-CM | POA: Diagnosis not present

## 2020-03-22 DIAGNOSIS — E1142 Type 2 diabetes mellitus with diabetic polyneuropathy: Secondary | ICD-10-CM | POA: Diagnosis not present

## 2020-03-22 DIAGNOSIS — C3490 Malignant neoplasm of unspecified part of unspecified bronchus or lung: Secondary | ICD-10-CM | POA: Diagnosis not present

## 2020-03-22 DIAGNOSIS — Z79891 Long term (current) use of opiate analgesic: Secondary | ICD-10-CM | POA: Diagnosis not present

## 2020-03-22 DIAGNOSIS — Z452 Encounter for adjustment and management of vascular access device: Secondary | ICD-10-CM | POA: Diagnosis not present

## 2020-03-22 DIAGNOSIS — G8929 Other chronic pain: Secondary | ICD-10-CM | POA: Diagnosis not present

## 2020-03-22 DIAGNOSIS — Z9981 Dependence on supplemental oxygen: Secondary | ICD-10-CM | POA: Diagnosis not present

## 2020-03-22 DIAGNOSIS — Z5181 Encounter for therapeutic drug level monitoring: Secondary | ICD-10-CM | POA: Diagnosis not present

## 2020-03-22 DIAGNOSIS — J869 Pyothorax without fistula: Secondary | ICD-10-CM | POA: Diagnosis not present

## 2020-03-22 DIAGNOSIS — L4050 Arthropathic psoriasis, unspecified: Secondary | ICD-10-CM | POA: Diagnosis not present

## 2020-03-22 DIAGNOSIS — R627 Adult failure to thrive: Secondary | ICD-10-CM | POA: Diagnosis not present

## 2020-03-22 DIAGNOSIS — J449 Chronic obstructive pulmonary disease, unspecified: Secondary | ICD-10-CM | POA: Diagnosis not present

## 2020-03-22 DIAGNOSIS — C3431 Malignant neoplasm of lower lobe, right bronchus or lung: Secondary | ICD-10-CM | POA: Diagnosis not present

## 2020-03-22 DIAGNOSIS — Z7951 Long term (current) use of inhaled steroids: Secondary | ICD-10-CM | POA: Diagnosis not present

## 2020-03-22 DIAGNOSIS — J910 Malignant pleural effusion: Secondary | ICD-10-CM | POA: Diagnosis not present

## 2020-03-22 DIAGNOSIS — Z792 Long term (current) use of antibiotics: Secondary | ICD-10-CM | POA: Diagnosis not present

## 2020-03-22 DIAGNOSIS — H44002 Unspecified purulent endophthalmitis, left eye: Secondary | ICD-10-CM | POA: Diagnosis not present

## 2020-03-22 DIAGNOSIS — R3915 Urgency of urination: Secondary | ICD-10-CM | POA: Diagnosis not present

## 2020-03-22 DIAGNOSIS — K5900 Constipation, unspecified: Secondary | ICD-10-CM | POA: Diagnosis not present

## 2020-03-22 DIAGNOSIS — I480 Paroxysmal atrial fibrillation: Secondary | ICD-10-CM | POA: Diagnosis not present

## 2020-03-23 DIAGNOSIS — H35371 Puckering of macula, right eye: Secondary | ICD-10-CM | POA: Diagnosis not present

## 2020-03-26 DIAGNOSIS — J869 Pyothorax without fistula: Secondary | ICD-10-CM | POA: Diagnosis not present

## 2020-03-29 DIAGNOSIS — Z452 Encounter for adjustment and management of vascular access device: Secondary | ICD-10-CM | POA: Diagnosis not present

## 2020-03-29 DIAGNOSIS — C3431 Malignant neoplasm of lower lobe, right bronchus or lung: Secondary | ICD-10-CM | POA: Diagnosis not present

## 2020-03-29 DIAGNOSIS — H44002 Unspecified purulent endophthalmitis, left eye: Secondary | ICD-10-CM | POA: Diagnosis not present

## 2020-03-29 DIAGNOSIS — J869 Pyothorax without fistula: Secondary | ICD-10-CM | POA: Diagnosis not present

## 2020-03-29 DIAGNOSIS — R7881 Bacteremia: Secondary | ICD-10-CM | POA: Diagnosis not present

## 2020-03-29 DIAGNOSIS — B9561 Methicillin susceptible Staphylococcus aureus infection as the cause of diseases classified elsewhere: Secondary | ICD-10-CM | POA: Diagnosis not present

## 2020-03-31 ENCOUNTER — Telehealth: Payer: Self-pay | Admitting: Medical Oncology

## 2020-03-31 NOTE — Telephone Encounter (Signed)
1-2 weeks.  Thank you.

## 2020-03-31 NOTE — Telephone Encounter (Signed)
Next appt - IV antibiotics completed. Now he is on Doxycycline . Port removed. Notes in care everywhere. When does Dr Julien Nordmann want to see pt?

## 2020-04-02 ENCOUNTER — Telehealth: Payer: Self-pay | Admitting: Internal Medicine

## 2020-04-02 NOTE — Telephone Encounter (Signed)
Scheduled appt per 10/28 sch  msg- pt wife is aware of appt date and time.

## 2020-04-07 DIAGNOSIS — Z452 Encounter for adjustment and management of vascular access device: Secondary | ICD-10-CM | POA: Diagnosis not present

## 2020-04-07 DIAGNOSIS — H44002 Unspecified purulent endophthalmitis, left eye: Secondary | ICD-10-CM | POA: Diagnosis not present

## 2020-04-07 DIAGNOSIS — B9561 Methicillin susceptible Staphylococcus aureus infection as the cause of diseases classified elsewhere: Secondary | ICD-10-CM | POA: Diagnosis not present

## 2020-04-07 DIAGNOSIS — J869 Pyothorax without fistula: Secondary | ICD-10-CM | POA: Diagnosis not present

## 2020-04-07 DIAGNOSIS — C3431 Malignant neoplasm of lower lobe, right bronchus or lung: Secondary | ICD-10-CM | POA: Diagnosis not present

## 2020-04-07 DIAGNOSIS — R7881 Bacteremia: Secondary | ICD-10-CM | POA: Diagnosis not present

## 2020-04-08 ENCOUNTER — Other Ambulatory Visit: Payer: Self-pay

## 2020-04-08 ENCOUNTER — Inpatient Hospital Stay: Payer: Medicare Other

## 2020-04-08 ENCOUNTER — Encounter: Payer: Self-pay | Admitting: Internal Medicine

## 2020-04-08 ENCOUNTER — Inpatient Hospital Stay: Payer: Medicare Other | Attending: Internal Medicine | Admitting: Internal Medicine

## 2020-04-08 VITALS — BP 113/64 | HR 77 | Temp 97.9°F | Resp 18 | Ht 73.0 in | Wt 171.5 lb

## 2020-04-08 DIAGNOSIS — Z79899 Other long term (current) drug therapy: Secondary | ICD-10-CM | POA: Insufficient documentation

## 2020-04-08 DIAGNOSIS — Z5112 Encounter for antineoplastic immunotherapy: Secondary | ICD-10-CM

## 2020-04-08 DIAGNOSIS — Z5111 Encounter for antineoplastic chemotherapy: Secondary | ICD-10-CM | POA: Diagnosis not present

## 2020-04-08 DIAGNOSIS — C3490 Malignant neoplasm of unspecified part of unspecified bronchus or lung: Secondary | ICD-10-CM | POA: Diagnosis not present

## 2020-04-08 DIAGNOSIS — E119 Type 2 diabetes mellitus without complications: Secondary | ICD-10-CM | POA: Diagnosis not present

## 2020-04-08 DIAGNOSIS — C3491 Malignant neoplasm of unspecified part of right bronchus or lung: Secondary | ICD-10-CM

## 2020-04-08 DIAGNOSIS — Z7984 Long term (current) use of oral hypoglycemic drugs: Secondary | ICD-10-CM | POA: Diagnosis not present

## 2020-04-08 DIAGNOSIS — D630 Anemia in neoplastic disease: Secondary | ICD-10-CM | POA: Diagnosis not present

## 2020-04-08 DIAGNOSIS — C3431 Malignant neoplasm of lower lobe, right bronchus or lung: Secondary | ICD-10-CM | POA: Insufficient documentation

## 2020-04-08 DIAGNOSIS — T451X5A Adverse effect of antineoplastic and immunosuppressive drugs, initial encounter: Secondary | ICD-10-CM | POA: Diagnosis not present

## 2020-04-08 DIAGNOSIS — Z9221 Personal history of antineoplastic chemotherapy: Secondary | ICD-10-CM | POA: Insufficient documentation

## 2020-04-08 DIAGNOSIS — Z7951 Long term (current) use of inhaled steroids: Secondary | ICD-10-CM | POA: Insufficient documentation

## 2020-04-08 DIAGNOSIS — I10 Essential (primary) hypertension: Secondary | ICD-10-CM | POA: Insufficient documentation

## 2020-04-08 DIAGNOSIS — J449 Chronic obstructive pulmonary disease, unspecified: Secondary | ICD-10-CM | POA: Insufficient documentation

## 2020-04-08 DIAGNOSIS — D6481 Anemia due to antineoplastic chemotherapy: Secondary | ICD-10-CM | POA: Insufficient documentation

## 2020-04-08 DIAGNOSIS — R5382 Chronic fatigue, unspecified: Secondary | ICD-10-CM

## 2020-04-08 DIAGNOSIS — R5383 Other fatigue: Secondary | ICD-10-CM | POA: Diagnosis not present

## 2020-04-08 LAB — CBC WITH DIFFERENTIAL (CANCER CENTER ONLY)
Abs Immature Granulocytes: 0.04 10*3/uL (ref 0.00–0.07)
Basophils Absolute: 0 10*3/uL (ref 0.0–0.1)
Basophils Relative: 0 %
Eosinophils Absolute: 0.1 10*3/uL (ref 0.0–0.5)
Eosinophils Relative: 1 %
HCT: 28.2 % — ABNORMAL LOW (ref 39.0–52.0)
Hemoglobin: 8.8 g/dL — ABNORMAL LOW (ref 13.0–17.0)
Immature Granulocytes: 1 %
Lymphocytes Relative: 13 %
Lymphs Abs: 0.8 10*3/uL (ref 0.7–4.0)
MCH: 31.9 pg (ref 26.0–34.0)
MCHC: 31.2 g/dL (ref 30.0–36.0)
MCV: 102.2 fL — ABNORMAL HIGH (ref 80.0–100.0)
Monocytes Absolute: 0.5 10*3/uL (ref 0.1–1.0)
Monocytes Relative: 8 %
Neutro Abs: 4.7 10*3/uL (ref 1.7–7.7)
Neutrophils Relative %: 77 %
Platelet Count: 136 10*3/uL — ABNORMAL LOW (ref 150–400)
RBC: 2.76 MIL/uL — ABNORMAL LOW (ref 4.22–5.81)
RDW: 15.7 % — ABNORMAL HIGH (ref 11.5–15.5)
WBC Count: 6.1 10*3/uL (ref 4.0–10.5)
nRBC: 0 % (ref 0.0–0.2)

## 2020-04-08 LAB — CMP (CANCER CENTER ONLY)
ALT: 7 U/L (ref 0–44)
AST: 12 U/L — ABNORMAL LOW (ref 15–41)
Albumin: 3.1 g/dL — ABNORMAL LOW (ref 3.5–5.0)
Alkaline Phosphatase: 82 U/L (ref 38–126)
Anion gap: 6 (ref 5–15)
BUN: 23 mg/dL (ref 8–23)
CO2: 27 mmol/L (ref 22–32)
Calcium: 9 mg/dL (ref 8.9–10.3)
Chloride: 108 mmol/L (ref 98–111)
Creatinine: 0.82 mg/dL (ref 0.61–1.24)
GFR, Estimated: >60 mL/min (ref >60)
Glucose, Bld: 113 mg/dL — ABNORMAL HIGH (ref 70–99)
Potassium: 4.3 mmol/L (ref 3.5–5.1)
Sodium: 141 mmol/L (ref 135–145)
Total Bilirubin: 0.3 mg/dL (ref 0.3–1.2)
Total Protein: 7.8 g/dL (ref 6.5–8.1)

## 2020-04-08 LAB — SAMPLE TO BLOOD BANK

## 2020-04-08 LAB — TSH: TSH: 1.555 u[IU]/mL (ref 0.320–4.118)

## 2020-04-08 NOTE — Progress Notes (Signed)
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  PD-L1 expression 20%.  PRIOR THERAPY: Palliative radiotherapy under the care of Dr. Sondra Come to the obstructive right lower lobe lung mass.  CURRENT THERAPY: Systemic chemotherapy with carboplatin for AUC of 5, paclitaxel 175 mg/M2 and Keytruda 200 mg IV every 3 weeks with Neulasta support.  First dose September 02, 2019.  Status post 6 cycles.  His treatment is currently on hold.  INTERVAL HISTORY: Jeremy [name] 74 y.o. male returns to the clinic today for follow-up visit accompanied by his wife.  The patient is feeling much better today.  He was admitted to [facility] for almost 2 weeks with staph infection of the eye.  He was treated with several courses of antibiotics.  He also has a chest tube placed on the right side of the thorax for hydropneumothorax.  The patient is currently on treatment with doxycycline.  He denied having any current chest pain but has shortness of breath with exertion with no cough or hemoptysis.  He has no nausea, vomiting, diarrhea or constipation.  He denied having any headache or visual changes.  He had repeat CT scan of the chest 2 weeks ago at [facility].  The patient is here today for evaluation and discussion of his treatment options.  MEDICAL HISTORY: Past Medical History:  Diagnosis Date  . Cancer ([place])    mass right lung  . COPD (chronic obstructive pulmonary disease) (HCC)    QUIT SMOKING 30 YRS AGO  . Diabetes ([place]) 2018  . HTN (hypertension)   . Psoriatic arthritis ([place]) 2015    ALLERGIES:  is allergic to [place] [pembrolizumab], chlorhexidine, and aleve [naproxen sodium].  MEDICATIONS:  Current Outpatient Medications  Medication Sig Dispense  Refill  . acetaminophen (TYLENOL) 325 MG tablet Take 2 tablets (650 mg total) by mouth every 6 (six) hours as needed for mild pain, fever or headache. 120 tablet 0  . albuterol (VENTOLIN HFA) 108 (90 Base) MCG/ACT inhaler Inhale 2 puffs into the lungs every 6 (six) hours as needed for wheezing or shortness of breath.    . cyclobenzaprine (FLEXERIL) 10 MG tablet     . diltiazem (CARDIZEM CD) 360 MG 24 hr capsule Take 1 capsule (360 mg total) by mouth daily. 30 capsule 6  . doxycycline (MONODOX) 50 MG capsule Take 50 mg by mouth 2 (two) times daily as needed (rosacea flare ups.).    Marland Kitchen ferrous sulfate 325 (65 FE) MG tablet Take 325 mg by mouth daily with supper.    Marland Kitchen FLOVENT HFA 110 MCG/ACT inhaler Inhale 1 puff into the lungs 2 (two) times daily.     . folic acid (FOLVITE) 1 MG tablet Take 1 mg by mouth daily.    Marland Kitchen gabapentin (NEURONTIN) 300 MG capsule Take 600 mg by mouth at bedtime.    . lidocaine-prilocaine (EMLA) cream Apply to the Port-A-Cath site 30 minutes before treatment 30 g 0  . loratadine (CLARITIN) 10 MG tablet Take 10 mg by mouth daily.    . metFORMIN (GLUCOPHAGE-XR) 500 MG 24 hr tablet Take 500-1,000 mg by mouth See admin instructions. Take 2 tablets (1000 mg) by mouth in the morning & 1 tablet (500 mg) by mouth at night.    Marland Kitchen  metoprolol tartrate (LOPRESSOR) 25 MG tablet Take 1 tablet (25 mg total) by mouth 2 (two) times daily. 180 tablet 1  . metroNIDAZOLE (METROCREAM) 0.75 % cream Apply 1 application topically 2 (two) times daily as needed (facial rosacea).     . mometasone (ELOCON) 0.1 % cream Apply 1 application topically 2 (two) times daily as needed (psoriasis).     . Omega-3 Fatty Acids (FISH OIL) 1000 MG CAPS Take 1,000 mg by mouth daily.     Marland Kitchen oxyCODONE-acetaminophen (PERCOCET/ROXICET) 5-325 MG tablet Take 1 tablet by mouth every 4 (four) hours as needed for severe pain.    . pantoprazole (PROTONIX) 40 MG tablet 1 po 30 mins prior to first meal 30 tablet 11  .  prochlorperazine (COMPAZINE) 10 MG tablet Take 1 tablet (10 mg total) by mouth every 6 (six) hours as needed for nausea or vomiting. 30 tablet 0  . senna-docusate (SENOKOT-S) 8.6-50 MG tablet Take 2 tablets by mouth at bedtime. 60 tablet 1  . traMADol (ULTRAM) 50 MG tablet Take 50 mg by mouth every 4 (four) hours as needed for moderate pain. for pain    . triamcinolone lotion (KENALOG) 0.1 % Apply 1 application topically 4 (four) times daily. 120 mL 1  . umeclidinium-vilanterol (ANORO ELLIPTA) 62.5-25 MCG/INH AEPB Inhale 1 puff into the lungs daily. 30 each 0   No current facility-administered medications for this visit.    SURGICAL HISTORY:  Past Surgical History:  Procedure Laterality Date  . BACK SURGERY  1990  . BIOPSY  12/30/2018   Procedure: BIOPSY;  Surgeon: Danie Binder, MD;  [facility];  Service: Endoscopy;;  gastric  . CATARACT EXTRACTION Bilateral   . CERVICAL SPINE SURGERY  1995  . COLONOSCOPY WITH PROPOFOL N/A 12/30/2018   Procedure: COLONOSCOPY WITH PROPOFOL;  Surgeon: Danie Binder, MD;  [facility];  Service: Endoscopy;  Laterality: N/A;  12:30pm  . ESOPHAGOGASTRODUODENOSCOPY (EGD) WITH PROPOFOL N/A 12/30/2018   Procedure: ESOPHAGOGASTRODUODENOSCOPY (EGD) WITH PROPOFOL;  Surgeon: Danie Binder, MD;  [facility];  Service: Endoscopy;  Laterality: N/A;  . IR IMAGING GUIDED PORT INSERTION  09/04/2019  . POLYPECTOMY  12/30/2018   Procedure: POLYPECTOMY;  Surgeon: Danie Binder, MD;  [facility];  Service: Endoscopy;;  colon  . THORACENTESIS Right 08/08/2019   Procedure: Thoracentesis;  Surgeon: [name], MD;  Location: [place];  Service: Pulmonary;  Laterality: Right;  Marland Kitchen VIDEO BRONCHOSCOPY Right 08/08/2019   Procedure: Video Bronchoscopy with Erbe Cryo Biopsy of Right mainstem;  Surgeon: [name], MD;  Location: [facility] OR;  Service: Pulmonary;  Laterality: Right;    REVIEW OF SYSTEMS:  Constitutional: positive for  fatigue Eyes: negative Ears, nose, mouth, throat, and face: negative Respiratory: positive for dyspnea on exertion Cardiovascular: negative Gastrointestinal: negative Genitourinary:negative Integument/breast: negative Hematologic/lymphatic: negative Musculoskeletal:negative Neurological: negative Behavioral/Psych: negative Endocrine: negative Allergic/Immunologic: negative   PHYSICAL EXAMINATION: General appearance: alert, cooperative, fatigued and no distress Head: Normocephalic, without obvious abnormality, atraumatic Neck: no adenopathy, no JVD, supple, symmetrical, trachea midline and thyroid not enlarged, symmetric, no tenderness/mass/nodules Lymph nodes: Cervical, supraclavicular, and axillary nodes normal. Resp: clear to auscultation bilaterally Back: symmetric, no curvature. ROM normal. No CVA tenderness. Cardio: regular rate and rhythm, S1, S2 normal, no murmur, click, rub or gallop GI: soft, non-tender; bowel sounds normal; no masses,  no organomegaly Extremities: extremities normal, atraumatic, no cyanosis or edema Neurologic: Alert and oriented X 3, normal strength and tone. Normal symmetric  reflexes. Normal coordination and gait  ECOG PERFORMANCE STATUS: 1 - Symptomatic but completely ambulatory  Blood pressure 113/64, pulse 77, temperature 97.9 F (36.6 C), temperature source Tympanic, resp. rate 18, height '6\' 1"'  (1.854 m), weight 171 lb 8 oz (77.8 kg), SpO2 98 %.  LABORATORY DATA: Lab Results  Component Value Date   WBC 6.1 04/08/2020   HGB 8.8 (L) 04/08/2020   HCT 28.2 (L) 04/08/2020   MCV 102.2 (H) 04/08/2020   PLT 136 (L) 04/08/2020      Chemistry      Component Value Date/Time   NA 137 02/04/2020 0908   K 4.3 02/04/2020 0908   CL 101 02/04/2020 0908   CO2 27 02/04/2020 0908   BUN 16 02/04/2020 0908   CREATININE 0.78 02/04/2020 0908      Component Value Date/Time   CALCIUM 9.5 02/04/2020 0908   ALKPHOS 99 02/04/2020 0908   AST 11 (L) 02/04/2020  0908   ALT 16 02/04/2020 0908   BILITOT 0.4 02/04/2020 0908       RADIOGRAPHIC STUDIES: No results found.  ASSESSMENT AND PLAN: This is a very pleasant 74 years old white male with stage IIIc/IV (T4, N2, M0/M1 a) non-small cell lung cancer, squamous cell carcinoma presented with large necrotic mass extending to the carina with associated obstruction of the right lower lobe bronchus and postobstructive collapse/consolidation and necrosis in the right lower lobe as well as mediastinal lymphadenopathy with 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   The patient has PD-L1 expression of 20%. The patient completed a course of palliative radiotherapy to the obstructive right lower lobe lung mass under the care of Dr. Sondra Come.  He is feeling a little bit better with less shortness of breath and chest pain. The patient is currently undergoing systemic chemotherapy with carboplatin and paclitaxel.  Status post 6 cycles.  His treatment with [name] was discontinued after cycle #1 secondary to significant skin rash.  He was treated with a tapered dose of prednisone. He continues his treatment with carboplatin and paclitaxel for a total of 6 cycles.  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 I recommended for the patient to continue on observation with repeat CT scan of the Chest, Abdomen and pelvis in 2 months for restaging of his disease. For the aching pain and pain management he will continue on Percocet. For the chemotherapy-induced anemia, we will continue to monitor his hemoglobin and hematocrit and consider him for transfusion if needed. He was advised to call immediately if he has any concerning symptoms in the interval. The patient voices understanding of current disease status and treatment options and is in agreement with the current care plan.  All questions were answered. The patient  knows to call the clinic with any problems, questions or concerns. We can certainly see the patient much sooner if necessary.  Disclaimer: This note was dictated with voice recognition software. Similar sounding words can inadvertently be transcribed and may not be corrected upon review.

## 2020-04-13 DIAGNOSIS — H44002 Unspecified purulent endophthalmitis, left eye: Secondary | ICD-10-CM | POA: Diagnosis not present

## 2020-04-13 DIAGNOSIS — B9561 Methicillin susceptible Staphylococcus aureus infection as the cause of diseases classified elsewhere: Secondary | ICD-10-CM | POA: Diagnosis not present

## 2020-04-13 DIAGNOSIS — J869 Pyothorax without fistula: Secondary | ICD-10-CM | POA: Diagnosis not present

## 2020-04-13 DIAGNOSIS — C3431 Malignant neoplasm of lower lobe, right bronchus or lung: Secondary | ICD-10-CM | POA: Diagnosis not present

## 2020-04-13 DIAGNOSIS — R7881 Bacteremia: Secondary | ICD-10-CM | POA: Diagnosis not present

## 2020-04-13 DIAGNOSIS — Z452 Encounter for adjustment and management of vascular access device: Secondary | ICD-10-CM | POA: Diagnosis not present

## 2020-04-14 DIAGNOSIS — D509 Iron deficiency anemia, unspecified: Secondary | ICD-10-CM | POA: Diagnosis not present

## 2020-04-14 DIAGNOSIS — I10 Essential (primary) hypertension: Secondary | ICD-10-CM | POA: Diagnosis not present

## 2020-04-14 DIAGNOSIS — G4701 Insomnia due to medical condition: Secondary | ICD-10-CM | POA: Diagnosis not present

## 2020-04-14 DIAGNOSIS — E782 Mixed hyperlipidemia: Secondary | ICD-10-CM | POA: Diagnosis not present

## 2020-04-14 DIAGNOSIS — G959 Disease of spinal cord, unspecified: Secondary | ICD-10-CM | POA: Diagnosis not present

## 2020-04-14 DIAGNOSIS — Z6822 Body mass index (BMI) 22.0-22.9, adult: Secondary | ICD-10-CM | POA: Diagnosis not present

## 2020-04-14 DIAGNOSIS — J439 Emphysema, unspecified: Secondary | ICD-10-CM | POA: Diagnosis not present

## 2020-04-14 DIAGNOSIS — E1165 Type 2 diabetes mellitus with hyperglycemia: Secondary | ICD-10-CM | POA: Diagnosis not present

## 2020-04-20 DIAGNOSIS — C3431 Malignant neoplasm of lower lobe, right bronchus or lung: Secondary | ICD-10-CM | POA: Diagnosis not present

## 2020-04-20 DIAGNOSIS — R7881 Bacteremia: Secondary | ICD-10-CM | POA: Diagnosis not present

## 2020-04-20 DIAGNOSIS — B9561 Methicillin susceptible Staphylococcus aureus infection as the cause of diseases classified elsewhere: Secondary | ICD-10-CM | POA: Diagnosis not present

## 2020-04-20 DIAGNOSIS — Z452 Encounter for adjustment and management of vascular access device: Secondary | ICD-10-CM | POA: Diagnosis not present

## 2020-04-20 DIAGNOSIS — H44002 Unspecified purulent endophthalmitis, left eye: Secondary | ICD-10-CM | POA: Diagnosis not present

## 2020-04-20 DIAGNOSIS — J869 Pyothorax without fistula: Secondary | ICD-10-CM | POA: Diagnosis not present

## 2020-05-05 DIAGNOSIS — Z23 Encounter for immunization: Secondary | ICD-10-CM | POA: Diagnosis not present

## 2020-05-18 DIAGNOSIS — H35371 Puckering of macula, right eye: Secondary | ICD-10-CM | POA: Diagnosis not present

## 2020-06-07 ENCOUNTER — Ambulatory Visit (HOSPITAL_COMMUNITY)
Admission: RE | Admit: 2020-06-07 | Discharge: 2020-06-07 | Disposition: A | Discharge status: Home / Self Care | Payer: Medicare Other | Source: Ambulatory Visit | Attending: Internal Medicine | Admitting: Internal Medicine

## 2020-06-07 ENCOUNTER — Other Ambulatory Visit: Payer: Self-pay

## 2020-06-07 DIAGNOSIS — J90 Pleural effusion, not elsewhere classified: Secondary | ICD-10-CM | POA: Diagnosis not present

## 2020-06-07 DIAGNOSIS — C3490 Malignant neoplasm of unspecified part of unspecified bronchus or lung: Secondary | ICD-10-CM | POA: Diagnosis not present

## 2020-06-07 DIAGNOSIS — I2510 Atherosclerotic heart disease of native coronary artery without angina pectoris: Secondary | ICD-10-CM | POA: Diagnosis not present

## 2020-06-07 DIAGNOSIS — I700 Atherosclerosis of aorta: Secondary | ICD-10-CM | POA: Diagnosis not present

## 2020-06-07 DIAGNOSIS — J929 Pleural plaque without asbestos: Secondary | ICD-10-CM | POA: Diagnosis not present

## 2020-06-07 DIAGNOSIS — K5730 Diverticulosis of large intestine without perforation or abscess without bleeding: Secondary | ICD-10-CM | POA: Diagnosis not present

## 2020-06-07 LAB — POCT I-STAT CREATININE: Creatinine, Ser: 1 mg/dL (ref 0.61–1.24)

## 2020-06-07 MED ORDER — IOHEXOL 300 MG/ML  SOLN
100.0000 mL | Freq: Once | INTRAMUSCULAR | Status: AC | PRN
  Administered 2020-06-07: 100 mL via INTRAVENOUS

## 2020-06-08 ENCOUNTER — Other Ambulatory Visit: Payer: Self-pay

## 2020-06-08 ENCOUNTER — Inpatient Hospital Stay: Payer: Medicare Other | Attending: Internal Medicine

## 2020-06-08 DIAGNOSIS — Z7984 Long term (current) use of oral hypoglycemic drugs: Secondary | ICD-10-CM | POA: Insufficient documentation

## 2020-06-08 DIAGNOSIS — Z79899 Other long term (current) drug therapy: Secondary | ICD-10-CM | POA: Diagnosis not present

## 2020-06-08 DIAGNOSIS — R5382 Chronic fatigue, unspecified: Secondary | ICD-10-CM | POA: Insufficient documentation

## 2020-06-08 DIAGNOSIS — J449 Chronic obstructive pulmonary disease, unspecified: Secondary | ICD-10-CM | POA: Insufficient documentation

## 2020-06-08 DIAGNOSIS — Z7951 Long term (current) use of inhaled steroids: Secondary | ICD-10-CM | POA: Insufficient documentation

## 2020-06-08 DIAGNOSIS — C3431 Malignant neoplasm of lower lobe, right bronchus or lung: Secondary | ICD-10-CM | POA: Insufficient documentation

## 2020-06-08 DIAGNOSIS — L409 Psoriasis, unspecified: Secondary | ICD-10-CM | POA: Insufficient documentation

## 2020-06-08 DIAGNOSIS — R0609 Other forms of dyspnea: Secondary | ICD-10-CM | POA: Insufficient documentation

## 2020-06-08 DIAGNOSIS — C3490 Malignant neoplasm of unspecified part of unspecified bronchus or lung: Secondary | ICD-10-CM

## 2020-06-08 DIAGNOSIS — Z923 Personal history of irradiation: Secondary | ICD-10-CM | POA: Insufficient documentation

## 2020-06-08 LAB — CBC WITH DIFFERENTIAL (CANCER CENTER ONLY)
Abs Immature Granulocytes: 0.02 10*3/uL (ref 0.00–0.07)
Basophils Absolute: 0 10*3/uL (ref 0.0–0.1)
Basophils Relative: 0 %
Eosinophils Absolute: 0.1 10*3/uL (ref 0.0–0.5)
Eosinophils Relative: 1 %
HCT: 31.1 % — ABNORMAL LOW (ref 39.0–52.0)
Hemoglobin: 9.9 g/dL — ABNORMAL LOW (ref 13.0–17.0)
Immature Granulocytes: 0 %
Lymphocytes Relative: 15 %
Lymphs Abs: 0.9 10*3/uL (ref 0.7–4.0)
MCH: 32.4 pg (ref 26.0–34.0)
MCHC: 31.8 g/dL (ref 30.0–36.0)
MCV: 101.6 fL — ABNORMAL HIGH (ref 80.0–100.0)
Monocytes Absolute: 0.5 10*3/uL (ref 0.1–1.0)
Monocytes Relative: 8 %
Neutro Abs: 4.3 10*3/uL (ref 1.7–7.7)
Neutrophils Relative %: 76 %
Platelet Count: 148 10*3/uL — ABNORMAL LOW (ref 150–400)
RBC: 3.06 MIL/uL — ABNORMAL LOW (ref 4.22–5.81)
RDW: 13.6 % (ref 11.5–15.5)
WBC Count: 5.7 10*3/uL (ref 4.0–10.5)
nRBC: 0 % (ref 0.0–0.2)

## 2020-06-08 LAB — CMP (CANCER CENTER ONLY)
ALT: 12 U/L (ref 0–44)
AST: 12 U/L — ABNORMAL LOW (ref 15–41)
Albumin: 3.2 g/dL — ABNORMAL LOW (ref 3.5–5.0)
Alkaline Phosphatase: 84 U/L (ref 38–126)
Anion gap: 8 (ref 5–15)
BUN: 23 mg/dL (ref 8–23)
CO2: 26 mmol/L (ref 22–32)
Calcium: 8.8 mg/dL — ABNORMAL LOW (ref 8.9–10.3)
Chloride: 107 mmol/L (ref 98–111)
Creatinine: 1.07 mg/dL (ref 0.61–1.24)
GFR, Estimated: >60 mL/min (ref >60)
Glucose, Bld: 143 mg/dL — ABNORMAL HIGH (ref 70–99)
Potassium: 4.1 mmol/L (ref 3.5–5.1)
Sodium: 141 mmol/L (ref 135–145)
Total Bilirubin: 0.3 mg/dL (ref 0.3–1.2)
Total Protein: 7.8 g/dL (ref 6.5–8.1)

## 2020-06-10 ENCOUNTER — Other Ambulatory Visit: Payer: Self-pay

## 2020-06-10 ENCOUNTER — Inpatient Hospital Stay (HOSPITAL_BASED_OUTPATIENT_CLINIC_OR_DEPARTMENT_OTHER): Payer: Medicare Other | Admitting: Internal Medicine

## 2020-06-10 ENCOUNTER — Telehealth: Payer: Self-pay | Admitting: Internal Medicine

## 2020-06-10 ENCOUNTER — Encounter: Payer: Self-pay | Admitting: Internal Medicine

## 2020-06-10 VITALS — BP 105/72 | HR 77 | Temp 96.8°F | Resp 18 | Ht 73.0 in | Wt 188.1 lb

## 2020-06-10 DIAGNOSIS — C3491 Malignant neoplasm of unspecified part of right bronchus or lung: Secondary | ICD-10-CM

## 2020-06-10 DIAGNOSIS — J449 Chronic obstructive pulmonary disease, unspecified: Secondary | ICD-10-CM | POA: Diagnosis not present

## 2020-06-10 DIAGNOSIS — C3490 Malignant neoplasm of unspecified part of unspecified bronchus or lung: Secondary | ICD-10-CM

## 2020-06-10 DIAGNOSIS — C3431 Malignant neoplasm of lower lobe, right bronchus or lung: Secondary | ICD-10-CM | POA: Diagnosis not present

## 2020-06-10 DIAGNOSIS — R0609 Other forms of dyspnea: Secondary | ICD-10-CM | POA: Diagnosis not present

## 2020-06-10 DIAGNOSIS — L409 Psoriasis, unspecified: Secondary | ICD-10-CM | POA: Diagnosis not present

## 2020-06-10 DIAGNOSIS — Z923 Personal history of irradiation: Secondary | ICD-10-CM | POA: Diagnosis not present

## 2020-06-10 DIAGNOSIS — R5382 Chronic fatigue, unspecified: Secondary | ICD-10-CM | POA: Diagnosis not present

## 2020-06-10 DIAGNOSIS — D630 Anemia in neoplastic disease: Secondary | ICD-10-CM | POA: Diagnosis not present

## 2020-06-10 NOTE — Progress Notes (Signed)
[facility] [phone]   [phone]  OFFICE PROGRESS NOTE  [name], MD [address] 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  PD-L1 expression 20%.  PRIOR THERAPY: Palliative radiotherapy under the care of Dr. Sondra Come to the obstructive right lower lobe lung mass.  CURRENT THERAPY: Systemic chemotherapy with carboplatin for AUC of 5, paclitaxel 175 mg/M2 and Keytruda 200 mg IV every 3 weeks with Neulasta support.  First dose September 02, 2019.  Status post 6 cycles.  His treatment is currently on hold.  INTERVAL HISTORY: Jeremy [name] 75 y.o. male returns to the clinic today for follow-up visit accompanied by his wife.  The patient is feeling much better today with no concerning complaints except for shortness of breath with exertion.  He denied having any current chest pain, cough or hemoptysis.  He denied having any fever or chills.  He has no nausea, vomiting, diarrhea or constipation.  He denied having any headache or visual changes.  He has been in observation now for several months.  The patient had repeat CT scan of the chest, abdomen pelvis performed recently and he is here for evaluation and discussion of his scan results.  MEDICAL HISTORY: Past Medical History:  Diagnosis Date  . Cancer ([facility])    mass right lung  . COPD (chronic obstructive pulmonary disease) ([facility])    QUIT SMOKING 30 YRS AGO  . Diabetes ([place]) 2018  . HTN (hypertension)   . Psoriatic arthritis ([facility]) 2015    ALLERGIES:  is allergic to chlorhexidine, pembrolizumab, and naproxen sodium.  MEDICATIONS:  Current Outpatient Medications  Medication Sig Dispense Refill  . acetaminophen (TYLENOL) 325 MG tablet Take 2 tablets (650 mg total) by mouth every 6 (six) hours as needed for mild pain, fever or headache. 120 tablet 0  . albuterol (VENTOLIN HFA)  108 (90 Base) MCG/ACT inhaler Inhale 2 puffs into the lungs every 6 (six) hours as needed for wheezing or shortness of breath.    . Artificial Tear Solution (JUST TEARS EYE DROPS) SOLN Place 1 drop into the left eye 6 times daily.    Marland Kitchen atropine 1 % ophthalmic solution     . brimonidine (ALPHAGAN) 0.15 % ophthalmic solution Place 1 drop into the left eye 2 (two) times daily.    . budesonide-formoterol (SYMBICORT) 160-4.5 MCG/ACT inhaler Inhale into the lungs.    . cyclobenzaprine (FLEXERIL) 10 MG tablet     . diltiazem (CARDIZEM CD) 360 MG 24 hr capsule Take 1 capsule (360 mg total) by mouth daily. 30 capsule 6  . doxycycline (MONODOX) 50 MG capsule Take 50 mg by mouth 2 (two) times daily as needed (rosacea flare ups.).    Marland Kitchen ferrous sulfate 325 (65 FE) MG tablet Take 325 mg by mouth daily with supper.    Marland Kitchen FLOVENT HFA 110 MCG/ACT inhaler Inhale 1 puff into the lungs 2 (two) times daily.     . folic acid (FOLVITE) 1 MG tablet Take 1 mg by mouth daily.    Marland Kitchen gabapentin (NEURONTIN) 300 MG capsule Take 600 mg by mouth at bedtime.    . lidocaine-prilocaine (EMLA) cream Apply to the Port-A-Cath site 30 minutes before treatment 30 g 0  . loratadine (CLARITIN) 10 MG tablet Take 10 mg by mouth daily.    . metFORMIN (GLUCOPHAGE-XR) 500 MG 24 hr tablet Take 500-1,000  mg by mouth See admin instructions. Take 2 tablets (1000 mg) by mouth in the morning & 1 tablet (500 mg) by mouth at night.    . metoprolol tartrate (LOPRESSOR) 25 MG tablet Take 1 tablet (25 mg total) by mouth 2 (two) times daily. 180 tablet 1  . metroNIDAZOLE (METROCREAM) 0.75 % cream Apply 1 application topically 2 (two) times daily as needed (facial rosacea).     . mometasone (ELOCON) 0.1 % cream Apply 1 application topically 2 (two) times daily as needed (psoriasis).     . Omega-3 Fatty Acids (FISH OIL) 1000 MG CAPS Take 1,000 mg by mouth daily.     Marland Kitchen oxyCODONE-acetaminophen (PERCOCET/ROXICET) 5-325 MG tablet Take 1 tablet by mouth every 4  (four) hours as needed for severe pain.    . pantoprazole (PROTONIX) 40 MG tablet 1 po 30 mins prior to first meal 30 tablet 11  . polyethylene glycol powder (GLYCOLAX/MIRALAX) 17 GM/SCOOP powder Take by mouth.    . prochlorperazine (COMPAZINE) 10 MG tablet Take 1 tablet (10 mg total) by mouth every 6 (six) hours as needed for nausea or vomiting. 30 tablet 0  . senna-docusate (SENOKOT-S) 8.6-50 MG tablet Take 2 tablets by mouth at bedtime. 60 tablet 1  . simvastatin (ZOCOR) 40 MG tablet Take by mouth.    . tamsulosin (FLOMAX) 0.4 MG CAPS capsule     . traMADol (ULTRAM) 50 MG tablet Take 50 mg by mouth every 4 (four) hours as needed for moderate pain. for pain    . triamcinolone cream (KENALOG) 0.1 % Apply topically.    . triamcinolone lotion (KENALOG) 0.1 % Apply 1 application topically 4 (four) times daily. 120 mL 1  . umeclidinium-vilanterol (ANORO ELLIPTA) 62.5-25 MCG/INH AEPB Inhale 1 puff into the lungs daily. 30 each 0   No current facility-administered medications for this visit.    SURGICAL HISTORY:  Past Surgical History:  Procedure Laterality Date  . BACK SURGERY  1990  . BIOPSY  12/30/2018   Procedure: BIOPSY;  Surgeon: Danie Binder, MD;  [facility];  Service: Endoscopy;;  gastric  . CATARACT EXTRACTION Bilateral   . CERVICAL SPINE SURGERY  1995  . COLONOSCOPY WITH PROPOFOL N/A 12/30/2018   Procedure: COLONOSCOPY WITH PROPOFOL;  Surgeon: Danie Binder, MD;  Location: [facility];  Service: Endoscopy;  Laterality: N/A;  12:30pm  . ESOPHAGOGASTRODUODENOSCOPY (EGD) WITH PROPOFOL N/A 12/30/2018   Procedure: ESOPHAGOGASTRODUODENOSCOPY (EGD) WITH PROPOFOL;  Surgeon: Danie Binder, MD;  [facility]: [facility];  Service: Endoscopy;  Laterality: N/A;  . IR IMAGING GUIDED PORT INSERTION  09/04/2019  . POLYPECTOMY  12/30/2018   Procedure: POLYPECTOMY;  Surgeon: Danie Binder, MD;  [facility]: [facility];  Service: Endoscopy;;  colon  . THORACENTESIS Right 08/08/2019    Procedure: Thoracentesis;  Surgeon: [name], MD;  Location: [facility];  Service: Pulmonary;  Laterality: Right;  Marland Kitchen VIDEO BRONCHOSCOPY Right 08/08/2019   Procedure: Video Bronchoscopy with Erbe Cryo Biopsy of Right mainstem;  Surgeon: [name], MD;  Location: [facility] OR;  Service: Pulmonary;  Laterality: Right;    REVIEW OF SYSTEMS:  A comprehensive review of systems was negative except for: Constitutional: positive for fatigue Respiratory: positive for dyspnea on exertion   PHYSICAL EXAMINATION: General appearance: alert, cooperative, fatigued and no distress Head: Normocephalic, without obvious abnormality, atraumatic Neck: no adenopathy, no JVD, supple, symmetrical, trachea midline and thyroid not enlarged, symmetric, no tenderness/mass/nodules Lymph nodes: Cervical, supraclavicular, and axillary nodes normal.  Resp: clear to auscultation bilaterally Back: symmetric, no curvature. ROM normal. No CVA tenderness. Cardio: regular rate and rhythm, S1, S2 normal, no murmur, click, rub or gallop GI: soft, non-tender; bowel sounds normal; no masses,  no organomegaly Extremities: extremities normal, atraumatic, no cyanosis or edema  ECOG PERFORMANCE STATUS: 1 - Symptomatic but completely ambulatory  Blood pressure 105/72, pulse 77, temperature (!) 96.8 F (36 C), temperature source Tympanic, resp. rate 18, height '6\' 1"'  (1.854 m), weight 188 lb 1.6 oz (85.3 kg), SpO2 96 %.  LABORATORY DATA: Lab Results  Component Value Date   WBC 5.7 06/08/2020   HGB 9.9 (L) 06/08/2020   HCT 31.1 (L) 06/08/2020   MCV 101.6 (H) 06/08/2020   PLT 148 (L) 06/08/2020      Chemistry      Component Value Date/Time   NA 141 06/08/2020 1155   K 4.1 06/08/2020 1155   CL 107 06/08/2020 1155   CO2 26 06/08/2020 1155   BUN 23 06/08/2020 1155   CREATININE 1.07 06/08/2020 1155      Component Value Date/Time   CALCIUM 8.8 (L) 06/08/2020 1155   ALKPHOS 84 06/08/2020 1155   AST 12 (L) 06/08/2020 1155   ALT  12 06/08/2020 1155   BILITOT 0.3 06/08/2020 1155       RADIOGRAPHIC STUDIES: CT Chest W Contrast  Result Date: 06/07/2020 CLINICAL DATA:  Follow-up non-small cell lung cancer, status post XRT, immunotherapy, ongoing chemotherapy EXAM: CT CHEST, ABDOMEN, AND PELVIS WITH CONTRAST TECHNIQUE: Multidetector CT imaging of the chest, abdomen and pelvis was performed following the standard protocol during bolus administration of intravenous contrast. CONTRAST:  158m OMNIPAQUE IOHEXOL 300 MG/ML SOLN, additional oral enteric contrast COMPARISON:  02/06/2020 FINDINGS: CT CHEST FINDINGS Cardiovascular: Aortic atherosclerosis. Normal heart size. Scattered left coronary artery calcifications. New trace pericardial effusion. Mediastinum/Nodes: Unchanged appearance of soft tissue about the right hilum as well as prominent subcentimeter pretracheal and superior mediastinal lymph nodes. Thyroid gland, trachea, and esophagus demonstrate no significant findings. Lungs/Pleura: Redemonstrated small moderate right pleural effusion, slightly decreased in volume compared to prior examination with persistent pleural thickening. There has been interval resolution of previously seen air loculations within this effusion. Post treatment appearance of the right chest is otherwise not significantly changed, with extensive fibrotic consolidation and volume loss of the right lower and right middle lobes as well as some evidence of septal thickening. Redemonstrated extensive, frothy debris within the right mainstem bronchus and more distal airways (series 3, image 72). Majority of previously seen pulmonary nodules in the left lobe have completely resolved or significantly improved, generally with irregular, somewhat bandlike residua (series 3, image 43), however there is a new cavitary nodule of the central left lower lobe measuring 1.5 x 1.5 cm (series 3, image 78). Musculoskeletal: No chest wall mass or suspicious bone lesions identified.  CT ABDOMEN PELVIS FINDINGS Hepatobiliary: No solid liver abnormality is seen. No gallstones, gallbladder wall thickening, or biliary dilatation. Pancreas: Unremarkable. No pancreatic ductal dilatation or surrounding inflammatory changes. Spleen: Normal in size without significant abnormality. Adrenals/Urinary Tract: Adrenal glands are unremarkable. Kidneys are normal, without renal calculi, solid lesion, or hydronephrosis. Bladder is unremarkable. Stomach/Bowel: Stomach is within normal limits. Small incidental diverticulum of the descending portion of the duodenum. Appendix appears normal. No evidence of bowel wall thickening, distention, or inflammatory changes. Descending and sigmoid diverticulosis. Vascular/Lymphatic: Aortic atherosclerosis. No enlarged abdominal or pelvic lymph nodes. Reproductive: No mass or other abnormality. Other: No abdominal wall hernia or abnormality. No abdominopelvic ascites.  Musculoskeletal: No acute or significant osseous findings. IMPRESSION: 1. Redemonstrated small to moderate right pleural effusion, slightly decreased in volume compared to prior examination with persistent pleural thickening. There has been interval resolution of previously seen air loculations within this effusion. This generally remains consistent with a malignant effusion. 2. Post treatment appearance of the right chest is otherwise not significantly changed, with extensive fibrotic consolidation and volume loss of the right lower and right middle lobes as well as some evidence of septal thickening, again most likely reflecting a combination of lymphangitic spread of disease and post treatment consolidation/fibrosis. 3. Redemonstrated extensive, frothy debris within the right mainstem bronchus and more distal airways, suggesting ongoing aspiration. 4. Majority of previously seen pulmonary nodules in the left lobe have completely resolved or significantly improved, generally with irregular, somewhat bandlike  residua, however there is a new cavitary nodule of the central left lower lobe measuring 1.5 x 1.5 cm. Findings are consistent with improved, but persistent and ongoing process, most likely infectious or inflammatory, septic emboli a strong differential consideration given nodule morphology. 5. No evidence of distant metastatic disease within the abdomen or pelvis. 6. Coronary artery disease. Aortic Atherosclerosis (ICD10-I70.0). Electronically Signed   By: Eddie Candle M.D.   On: 06/07/2020 11:25   CT Abdomen Pelvis W Contrast  Result Date: 06/07/2020 CLINICAL DATA:  Follow-up non-small cell lung cancer, status post XRT, immunotherapy, ongoing chemotherapy EXAM: CT CHEST, ABDOMEN, AND PELVIS WITH CONTRAST TECHNIQUE: Multidetector CT imaging of the chest, abdomen and pelvis was performed following the standard protocol during bolus administration of intravenous contrast. CONTRAST:  161m OMNIPAQUE IOHEXOL 300 MG/ML SOLN, additional oral enteric contrast COMPARISON:  02/06/2020 FINDINGS: CT CHEST FINDINGS Cardiovascular: Aortic atherosclerosis. Normal heart size. Scattered left coronary artery calcifications. New trace pericardial effusion. Mediastinum/Nodes: Unchanged appearance of soft tissue about the right hilum as well as prominent subcentimeter pretracheal and superior mediastinal lymph nodes. Thyroid gland, trachea, and esophagus demonstrate no significant findings. Lungs/Pleura: Redemonstrated small moderate right pleural effusion, slightly decreased in volume compared to prior examination with persistent pleural thickening. There has been interval resolution of previously seen air loculations within this effusion. Post treatment appearance of the right chest is otherwise not significantly changed, with extensive fibrotic consolidation and volume loss of the right lower and right middle lobes as well as some evidence of septal thickening. Redemonstrated extensive, frothy debris within the right mainstem  bronchus and more distal airways (series 3, image 72). Majority of previously seen pulmonary nodules in the left lobe have completely resolved or significantly improved, generally with irregular, somewhat bandlike residua (series 3, image 43), however there is a new cavitary nodule of the central left lower lobe measuring 1.5 x 1.5 cm (series 3, image 78). Musculoskeletal: No chest wall mass or suspicious bone lesions identified. CT ABDOMEN PELVIS FINDINGS Hepatobiliary: No solid liver abnormality is seen. No gallstones, gallbladder wall thickening, or biliary dilatation. Pancreas: Unremarkable. No pancreatic ductal dilatation or surrounding inflammatory changes. Spleen: Normal in size without significant abnormality. Adrenals/Urinary Tract: Adrenal glands are unremarkable. Kidneys are normal, without renal calculi, solid lesion, or hydronephrosis. Bladder is unremarkable. Stomach/Bowel: Stomach is within normal limits. Small incidental diverticulum of the descending portion of the duodenum. Appendix appears normal. No evidence of bowel wall thickening, distention, or inflammatory changes. Descending and sigmoid diverticulosis. Vascular/Lymphatic: Aortic atherosclerosis. No enlarged abdominal or pelvic lymph nodes. Reproductive: No mass or other abnormality. Other: No abdominal wall hernia or abnormality. No abdominopelvic ascites. Musculoskeletal: No acute or significant osseous findings. IMPRESSION:  1. Redemonstrated small to moderate right pleural effusion, slightly decreased in volume compared to prior examination with persistent pleural thickening. There has been interval resolution of previously seen air loculations within this effusion. This generally remains consistent with a malignant effusion. 2. Post treatment appearance of the right chest is otherwise not significantly changed, with extensive fibrotic consolidation and volume loss of the right lower and right middle lobes as well as some evidence of  septal thickening, again most likely reflecting a combination of lymphangitic spread of disease and post treatment consolidation/fibrosis. 3. Redemonstrated extensive, frothy debris within the right mainstem bronchus and more distal airways, suggesting ongoing aspiration. 4. Majority of previously seen pulmonary nodules in the left lobe have completely resolved or significantly improved, generally with irregular, somewhat bandlike residua, however there is a new cavitary nodule of the central left lower lobe measuring 1.5 x 1.5 cm. Findings are consistent with improved, but persistent and ongoing process, most likely infectious or inflammatory, septic emboli a strong differential consideration given nodule morphology. 5. No evidence of distant metastatic disease within the abdomen or pelvis. 6. Coronary artery disease. Aortic Atherosclerosis (ICD10-I70.0). Electronically Signed   By: Eddie Candle M.D.   On: 06/07/2020 11:25    ASSESSMENT AND PLAN: This is a very pleasant 75 years old white male with stage IIIc/IV (T4, N2, M0/M1 a) non-small cell lung cancer, squamous cell carcinoma presented with large necrotic mass extending to the carina with associated obstruction of the right lower lobe bronchus and postobstructive collapse/consolidation and necrosis in the right lower lobe as well as mediastinal lymphadenopathy with 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   The patient has PD-L1 expression of 20%. The patient completed a course of palliative radiotherapy to the obstructive right lower lobe lung mass under the care of Dr. Sondra Come.  He is feeling a little bit better with less shortness of breath and chest pain. The patient is currently undergoing systemic chemotherapy with carboplatin and paclitaxel.  Status post 6 cycles.  His treatment with [name] was discontinued after cycle #1 secondary to significant skin rash.  He was treated with a tapered dose of prednisone. He  continues his treatment with carboplatin and paclitaxel for a total of 6 cycles.  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 The patient is doing much better now with no concerning complaints. He had repeat CT scan of the chest, abdomen pelvis performed recently.  I personally and independently reviewed the scans and discussed the result with the patient and his wife. His scan showed no concerning findings for disease progression. I recommended for him to continue on observation with repeat CT scan of the chest, abdomen pelvis in 3 months. For the psoriasis, the patient will follow with his rheumatologist for further recommendation. He was advised to call immediately if he has any other concerning symptoms in the interval. The patient voices understanding of current disease status and treatment options and is in agreement with the current care plan.  All questions were answered. The patient knows to call the clinic with any problems, questions or concerns. We can certainly see the patient much sooner if necessary.  Disclaimer: This note was dictated with voice recognition software. Similar sounding words can inadvertently be transcribed and may not be corrected upon review.

## 2020-06-10 NOTE — Telephone Encounter (Signed)
Scheduled appointments per 1/6 los; Spoke to patient who is aware of appointments dates and times.

## 2020-06-14 DIAGNOSIS — T80211D Bloodstream infection due to central venous catheter, subsequent encounter: Secondary | ICD-10-CM | POA: Diagnosis not present

## 2020-06-14 DIAGNOSIS — J869 Pyothorax without fistula: Secondary | ICD-10-CM | POA: Diagnosis not present

## 2020-06-14 DIAGNOSIS — H4419 Other endophthalmitis: Secondary | ICD-10-CM | POA: Diagnosis not present

## 2020-06-14 DIAGNOSIS — R7881 Bacteremia: Secondary | ICD-10-CM | POA: Diagnosis not present

## 2020-06-14 DIAGNOSIS — L4050 Arthropathic psoriasis, unspecified: Secondary | ICD-10-CM | POA: Diagnosis not present

## 2020-06-14 DIAGNOSIS — Z85118 Personal history of other malignant neoplasm of bronchus and lung: Secondary | ICD-10-CM | POA: Diagnosis not present

## 2020-06-28 DIAGNOSIS — I739 Peripheral vascular disease, unspecified: Secondary | ICD-10-CM | POA: Diagnosis not present

## 2020-06-28 DIAGNOSIS — M79674 Pain in right toe(s): Secondary | ICD-10-CM | POA: Diagnosis not present

## 2020-06-28 DIAGNOSIS — M79671 Pain in right foot: Secondary | ICD-10-CM | POA: Diagnosis not present

## 2020-06-28 DIAGNOSIS — M79675 Pain in left toe(s): Secondary | ICD-10-CM | POA: Diagnosis not present

## 2020-06-28 DIAGNOSIS — M79672 Pain in left foot: Secondary | ICD-10-CM | POA: Diagnosis not present

## 2020-06-29 DIAGNOSIS — C3490 Malignant neoplasm of unspecified part of unspecified bronchus or lung: Secondary | ICD-10-CM | POA: Diagnosis not present

## 2020-06-29 DIAGNOSIS — E785 Hyperlipidemia, unspecified: Secondary | ICD-10-CM | POA: Diagnosis not present

## 2020-06-29 DIAGNOSIS — Z79899 Other long term (current) drug therapy: Secondary | ICD-10-CM | POA: Diagnosis not present

## 2020-06-29 DIAGNOSIS — M2550 Pain in unspecified joint: Secondary | ICD-10-CM | POA: Diagnosis not present

## 2020-06-29 DIAGNOSIS — R634 Abnormal weight loss: Secondary | ICD-10-CM | POA: Diagnosis not present

## 2020-06-29 DIAGNOSIS — M1990 Unspecified osteoarthritis, unspecified site: Secondary | ICD-10-CM | POA: Diagnosis not present

## 2020-06-29 DIAGNOSIS — I10 Essential (primary) hypertension: Secondary | ICD-10-CM | POA: Diagnosis not present

## 2020-06-29 DIAGNOSIS — L4050 Arthropathic psoriasis, unspecified: Secondary | ICD-10-CM | POA: Diagnosis not present

## 2020-06-29 DIAGNOSIS — M549 Dorsalgia, unspecified: Secondary | ICD-10-CM | POA: Diagnosis not present

## 2020-06-29 DIAGNOSIS — E119 Type 2 diabetes mellitus without complications: Secondary | ICD-10-CM | POA: Diagnosis not present

## 2020-06-29 DIAGNOSIS — M542 Cervicalgia: Secondary | ICD-10-CM | POA: Diagnosis not present

## 2020-07-21 DIAGNOSIS — M5430 Sciatica, unspecified side: Secondary | ICD-10-CM | POA: Diagnosis not present

## 2020-07-21 DIAGNOSIS — M7020 Olecranon bursitis, unspecified elbow: Secondary | ICD-10-CM | POA: Diagnosis not present

## 2020-07-21 DIAGNOSIS — Z6824 Body mass index (BMI) 24.0-24.9, adult: Secondary | ICD-10-CM | POA: Diagnosis not present

## 2020-07-23 ENCOUNTER — Ambulatory Visit (INDEPENDENT_AMBULATORY_CARE_PROVIDER_SITE_OTHER): Payer: Medicare Other | Admitting: Cardiology

## 2020-07-23 ENCOUNTER — Encounter: Payer: Self-pay | Admitting: Cardiology

## 2020-07-23 ENCOUNTER — Other Ambulatory Visit: Payer: Self-pay

## 2020-07-23 VITALS — BP 100/62 | HR 82 | Ht 74.0 in | Wt 185.2 lb

## 2020-07-23 DIAGNOSIS — I480 Paroxysmal atrial fibrillation: Secondary | ICD-10-CM | POA: Diagnosis not present

## 2020-07-23 MED ORDER — METOPROLOL TARTRATE 25 MG PO TABS: 25.0000 mg | ORAL_TABLET | Freq: Two times a day (BID) | ORAL | 3 refills | Status: DC

## 2020-07-23 NOTE — Patient Instructions (Addendum)
Medication Instructions:   Your physician recommends that you continue on your current medications as directed. Please refer to the Current Medication list given to you today.  Labwork:  noine  Testing/Procedures:  none  Follow-Up:  Your physician recommends that you schedule a follow-up appointment in: 6 months.  Any Other Special Instructions Will Be Listed Below (If Applicable).  If you need a refill on your cardiac medications before your next appointment, please call your pharmacy.

## 2020-07-23 NOTE — Progress Notes (Signed)
Clinical Summary Jeremy [name] is a 75 y.o.male seen today for follow up of the followingmedical problems.   1. PAF - no recent palpitaitons.  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   - no recent palpitations - compliant with meds  2. Lung cancer stage IIIc/IV (T4, N2, M0/M1 a) non-small cell lung cancer, squamous cell carcinoma.  - followed by oncolgy   3. 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 Multiple prior transfusions - no recent blood transfusions.  Past Medical History:  Diagnosis Date  . Cancer ([place])    mass right lung  . COPD (chronic obstructive pulmonary disease) ([facility])    QUIT SMOKING 30 YRS AGO  . Diabetes ([place]) 2018  . HTN (hypertension)   . Psoriatic arthritis ([place]) 2015     Allergies  Allergen Reactions  . Chlorhexidine Anaphylaxis    Per pt, tolerates chlorhexidine when accessing port a cath with no issues and does not remember having a reaction to this. Per pt, tolerates chlorhexidine when accessing port a cath with no issues and does not remember having a reaction to this.  . Pembrolizumab Rash  . Naproxen Sodium Hives, Rash and Swelling    Lips and mouth     Current Outpatient Medications  Medication Sig Dispense Refill  . acetaminophen (TYLENOL) 325 MG tablet Take 2 tablets (650 mg total) by mouth every 6 (six) hours as needed for mild pain, fever or headache. 120 tablet 0  . albuterol (VENTOLIN HFA) 108 (90 Base) MCG/ACT inhaler Inhale 2 puffs into the lungs every 6 (six) hours as needed for wheezing or shortness of breath.    . Artificial Tear Solution (JUST TEARS EYE DROPS) SOLN Place 1 drop into the left eye 6 times daily. (Patient not taking: Reported on 06/10/2020)    . atropine 1 % ophthalmic solution  (Patient not taking: Reported on 06/10/2020)    . brimonidine (ALPHAGAN) 0.15 % ophthalmic solution Place 1 drop into the left eye 2 (two) times  daily. (Patient not taking: Reported on 06/10/2020)    . budesonide-formoterol (SYMBICORT) 160-4.5 MCG/ACT inhaler Inhale into the lungs. (Patient not taking: Reported on 06/10/2020)    . cyclobenzaprine (FLEXERIL) 10 MG tablet     . diltiazem (CARDIZEM CD) 360 MG 24 hr capsule Take 1 capsule (360 mg total) by mouth daily. 30 capsule 6  . doxycycline (MONODOX) 50 MG capsule Take 50 mg by mouth 2 (two) times daily as needed (rosacea flare ups.).    Marland Kitchen ferrous sulfate 325 (65 FE) MG tablet Take 325 mg by mouth daily with supper.    Marland Kitchen FLOVENT HFA 110 MCG/ACT inhaler Inhale 1 puff into the lungs 2 (two) times daily.     . folic acid (FOLVITE) 1 MG tablet Take 1 mg by mouth daily.    Marland Kitchen gabapentin (NEURONTIN) 300 MG capsule Take 600 mg by mouth at bedtime.    . lidocaine-prilocaine (EMLA) cream Apply to the Port-A-Cath site 30 minutes before treatment 30 g 0  . loratadine (CLARITIN) 10 MG tablet Take 10 mg by mouth daily.    . metFORMIN (GLUCOPHAGE-XR) 500 MG 24 hr tablet Take 500-1,000 mg by mouth See admin instructions. Take 2 tablets (1000 mg) by mouth in the morning & 1 tablet (500 mg) by mouth at night.    . metoprolol tartrate (LOPRESSOR) 25 MG tablet Take 1 tablet (25 mg total) by mouth  2 (two) times daily. 180 tablet 1  . metroNIDAZOLE (METROCREAM) 0.75 % cream Apply 1 application topically 2 (two) times daily as needed (facial rosacea).     . mometasone (ELOCON) 0.1 % cream Apply 1 application topically 2 (two) times daily as needed (psoriasis).     . Omega-3 Fatty Acids (FISH OIL) 1000 MG CAPS Take 1,000 mg by mouth daily.     Marland Kitchen oxyCODONE-acetaminophen (PERCOCET/ROXICET) 5-325 MG tablet Take 1 tablet by mouth every 4 (four) hours as needed for severe pain.    . pantoprazole (PROTONIX) 40 MG tablet 1 po 30 mins prior to first meal (Patient not taking: Reported on 06/10/2020) 30 tablet 11  . polyethylene glycol powder (GLYCOLAX/MIRALAX) 17 GM/SCOOP powder Take by mouth. (Patient not taking: Reported on  06/10/2020)    . prednisoLONE acetate (PRED FORTE) 1 % ophthalmic suspension 1 drop 4 (four) times daily. One drop left eye BID    . prochlorperazine (COMPAZINE) 10 MG tablet Take 1 tablet (10 mg total) by mouth every 6 (six) hours as needed for nausea or vomiting. 30 tablet 0  . senna-docusate (SENOKOT-S) 8.6-50 MG tablet Take 2 tablets by mouth at bedtime. 60 tablet 1  . simvastatin (ZOCOR) 40 MG tablet Take by mouth.    . tamsulosin (FLOMAX) 0.4 MG CAPS capsule     . traMADol (ULTRAM) 50 MG tablet Take 50 mg by mouth every 4 (four) hours as needed for moderate pain. for pain    . triamcinolone lotion (KENALOG) 0.1 % Apply 1 application topically 4 (four) times daily. 120 mL 1  . umeclidinium-vilanterol (ANORO ELLIPTA) 62.5-25 MCG/INH AEPB Inhale 1 puff into the lungs daily. 30 each 0   No current facility-administered medications for this visit.     Past Surgical History:  Procedure Laterality Date  . BACK SURGERY  1990  . BIOPSY  12/30/2018   Procedure: BIOPSY;  Surgeon: Danie Binder, MD;  [facility];  Service: Endoscopy;;  gastric  . CATARACT EXTRACTION Bilateral   . CERVICAL SPINE SURGERY  1995  . COLONOSCOPY WITH PROPOFOL N/A 12/30/2018   Procedure: COLONOSCOPY WITH PROPOFOL;  Surgeon: Danie Binder, MD;  [facility];  Service: Endoscopy;  Laterality: N/A;  12:30pm  . ESOPHAGOGASTRODUODENOSCOPY (EGD) WITH PROPOFOL N/A 12/30/2018   Procedure: ESOPHAGOGASTRODUODENOSCOPY (EGD) WITH PROPOFOL;  Surgeon: Danie Binder, MD;  [facility];  Service: Endoscopy;  Laterality: N/A;  . IR IMAGING GUIDED PORT INSERTION  09/04/2019  . POLYPECTOMY  12/30/2018   Procedure: POLYPECTOMY;  Surgeon: Danie Binder, MD;  [facility];  Service: Endoscopy;;  colon  . THORACENTESIS Right 08/08/2019   Procedure: Thoracentesis;  Surgeon: [name], MD;  Location: [place];  Service: Pulmonary;  Laterality: Right;  Marland Kitchen VIDEO BRONCHOSCOPY Right 08/08/2019    Procedure: Video Bronchoscopy with Erbe Cryo Biopsy of Right mainstem;  Surgeon: [name], MD;  Location: [facility] OR;  Service: Pulmonary;  Laterality: Right;     Allergies  Allergen Reactions  . Chlorhexidine Anaphylaxis    Per pt, tolerates chlorhexidine when accessing port a cath with no issues and does not remember having a reaction to this. Per pt, tolerates chlorhexidine when accessing port a cath with no issues and does not remember having a reaction to this.  . Pembrolizumab Rash  . Naproxen Sodium Hives, Rash and Swelling    Lips and mouth      Family History  Problem Relation Age of Onset  .  Alcoholism Father   . CAD Brother   . Diabetes Brother   . Colon cancer Neg Hx   . Colon polyps Neg Hx   . Gastric cancer Neg Hx      Social History Mr. Wertheim reports that he quit smoking about 28 years ago. His smoking use included cigarettes. He has a 25.00 pack-year smoking history. He has never used smokeless tobacco. Mr. Chaikin reports current alcohol use.   Review of Systems CONSTITUTIONAL: No weight loss, fever, chills, weakness or fatigue.  HEENT: Eyes: No visual loss, blurred vision, double vision or yellow sclerae.No hearing loss, sneezing, congestion, runny nose or sore throat.  SKIN: No rash or itching.  CARDIOVASCULAR: per hpi RESPIRATORY: No shortness of breath, cough or sputum.  GASTROINTESTINAL: No anorexia, nausea, vomiting or diarrhea. No abdominal pain or blood.  GENITOURINARY: No burning on urination, no polyuria NEUROLOGICAL: No headache, dizziness, syncope, paralysis, ataxia, numbness or tingling in the extremities. No change in bowel or bladder control.  MUSCULOSKELETAL: No muscle, back pain, joint pain or stiffness.  LYMPHATICS: No enlarged nodes. No history of splenectomy.  PSYCHIATRIC: No history of depression or anxiety.  ENDOCRINOLOGIC: No reports of sweating, cold or heat intolerance. No polyuria or polydipsia.  Marland Kitchen   Physical Examination Today's  Vitals   07/23/20 1535  BP: 100/62  Pulse: 82  SpO2: 96%  Weight: 185 lb 3.2 oz (84 kg)  Height: 6\' 2"  (1.88 m)   Body mass index is 23.78 kg/m.  Gen: resting comfortably, no acute distress HEENT: no scleral icterus, pupils equal round and reactive, no palptable cervical adenopathy,  CV: RRR, no mr/g, no jvd Resp: Clear to auscultation bilaterally GI: abdomen is soft, non-tender, non-distended, normal bowel sounds, no hepatosplenomegaly MSK: extremities are warm, no edema.  Skin: warm, no rash Neuro:  no focal deficits Psych: appropriate affect   Diagnostic Studies  10/2019 echo 1. Left ventricular ejection fraction, by estimation, is 60 to 65%. The  left ventricle has normal function. Left ventricular endocardial border  not optimally defined to evaluate regional wall motion. Left ventricular  diastolic parameters are consistent  with Grade I diastolic dysfunction (impaired relaxation).  2. Right ventricular systolic function is normal. The right ventricular  size is moderately enlarged.  3. Right atrial size was mild to moderately dilated.  4. The mitral valve was not well visualized. Trivial mitral valve  regurgitation.  5. Tricuspid valve regurgitation unable to assess.  6. The aortic valve was not well visualized. Aortic valve regurgitation  is mild.  7. Pulmonic valve regurgitation unable to assess.  8. Aortic dilatation noted. There is mild dilatation of the aortic root.    02/2020 TEE [facility] SUMMARY  No valvular vegetation present.  Diffuse thickening of the aortic valve with preserved cusp opening.  There is mild aortic regurgitation.  Structurally normal tricuspid valve. There is mild tricuspid  regurgitation. There is mild tricuspid valve prolapse.  Left ventricular systolic function is normal. The left atrial size is  normal.  No thrombus is detected in the left atrial appendage.  Right atrial size is normal. A patent foramen ovale is present.  There  is no comparison study available.   02/2020 TTE  SUMMARY  The left ventricular size is normal. Left ventricular systolic  function is normal. LV ejection fraction = 55-60%. Unable to fully  assess LV regional wall motion.   The right ventricle is normal in size and function.   Off axis views of the mitral and tricupsid valve  wereobtained dueto  suboptimal iamge quality from on axis views. Thickened tricuspid  leaflets noted, no significant tricuspid regurgitation. Unable to  exclude a vegetation.   Mitral leaflets open well, remaining two valves are not well  visualized.     Assessment and Plan   1. PAF - asymptomatic, episodes typically noticed during medical visits or by vitals checks at home -no recent symptoms - continue current meds - not on anticoag due to history of anemia of neoplastic disease, frequent transfusions      Arnoldo Lenis, M.D.

## 2020-07-29 DIAGNOSIS — D72829 Elevated white blood cell count, unspecified: Secondary | ICD-10-CM | POA: Diagnosis not present

## 2020-08-03 DIAGNOSIS — M545 Low back pain, unspecified: Secondary | ICD-10-CM | POA: Diagnosis not present

## 2020-08-03 DIAGNOSIS — H35371 Puckering of macula, right eye: Secondary | ICD-10-CM | POA: Diagnosis not present

## 2020-08-03 DIAGNOSIS — H44002 Unspecified purulent endophthalmitis, left eye: Secondary | ICD-10-CM | POA: Diagnosis not present

## 2020-08-13 DIAGNOSIS — M9973 Connective tissue and disc stenosis of intervertebral foramina of lumbar region: Secondary | ICD-10-CM | POA: Diagnosis not present

## 2020-08-13 DIAGNOSIS — M545 Low back pain, unspecified: Secondary | ICD-10-CM | POA: Diagnosis not present

## 2020-08-13 DIAGNOSIS — M5126 Other intervertebral disc displacement, lumbar region: Secondary | ICD-10-CM | POA: Diagnosis not present

## 2020-08-20 DIAGNOSIS — M5127 Other intervertebral disc displacement, lumbosacral region: Secondary | ICD-10-CM | POA: Diagnosis not present

## 2020-08-23 ENCOUNTER — Other Ambulatory Visit: Payer: Self-pay | Admitting: Neurological Surgery

## 2020-08-24 ENCOUNTER — Telehealth: Payer: Self-pay | Admitting: Cardiology

## 2020-08-24 NOTE — Telephone Encounter (Signed)
   Primary Cardiologist: Carlyle Dolly, MD  Chart reviewed as part of pre-operative protocol coverage. Given past medical history and time since last visit, based on ACC/AHA guidelines, Jeremy [name] would be at acceptable risk for the planned procedure without further cardiovascular testing.   His RCRI is class I risk, 0.4% risk of major cardiac event.  I will route this recommendation to the requesting party via Epic fax function and remove from pre-op pool.  Please call with questions.  Jossie Ng. Cleaver NP-C    08/24/2020, 8:56 AM [place] [address] Office [phone] Fax [phone]

## 2020-08-24 NOTE — Telephone Encounter (Signed)
   [facility] Pre-operative Risk Assessment    HEARTCARE STAFF: - Please ensure there is not already an duplicate clearance open for this procedure. - Under Visit Info/Reason for Call, type in Other and utilize the format Clearance MM/DD/YY or Clearance TBD. Do not use dashes or single digits. - If request is for dental extraction, please clarify the # of teeth to be extracted.  Request for surgical clearance:  1. What type of surgery is being performed?Right L3-4 Microdiscectomy  2. When is this surgery scheduled? Not scheduled yet   3. What type of clearance is required (medical clearance vs. Pharmacy clearance to hold med vs. Both)? Both  4. Are there any medications that need to be held prior to surgery and how long? Not noted   5. Practice name and name of physician performing surgery? Dr. Kristeen Miss  6. What is the office phone number?[phone]   7.   What is the office fax number? [phone]  8.   Anesthesia type (None, local, MAC, general) ? Not noted    Jeremy [name] 08/24/2020, 8:34 AM  _________________________________________________________________   (provider comments below)

## 2020-08-25 NOTE — Progress Notes (Addendum)
Surgical Instructions   Your procedure is scheduled on Monday, March 28th.  Report to [facility] Main Entrance "A" at 11:45 A.M., then check in with the Admitting office.  Call this number if you have problems the morning of surgery:  [phone]   If you have any questions prior to your surgery date call [phone]: Open Monday-Friday 8am-4pm   Remember:  Do not eat or drink after midnight the night before your surgery   Take these medicines the morning of surgery with A SIP OF WATER  diltiazem (CARDIZEM CD)  docusate sodium (COLACE)  metoprolol tartrate (LOPRESSOR)  tamsulosin (FLOMAX)  If needed: acetaminophen (TYLENOL), cyclobenzaprine (FLEXERIL), oxyCODONE-acetaminophen (PERCOCET/ROXICET), prednisoLONE acetate (PRED FORTE)/eye drops , prochlorperazine (COMPAZINE),       albuterol (VENTOLIN HFA)/inhaler   As of today, STOP taking any Aspirin (unless otherwise instructed by your surgeon) Aleve, Naproxen, Ibuprofen, Motrin, Advil, Goody's, BC's, all herbal medications, fish oil, and all vitamins.          WHAT DO I DO ABOUT MY DIABETES MEDICATION?  The morning of surgery  Do NOT take metFORMIN (GLUCOPHAGE-XR)   HOW TO MANAGE YOUR DIABETES BEFORE AND AFTER SURGERY  Why is it important to control my blood sugar before and after surgery? . Improving blood sugar levels before and after surgery helps healing and can limit problems. . A way of improving blood sugar control is eating a healthy diet by: o  Eating less sugar and carbohydrates o  Increasing activity/exercise o  Talking with your doctor about reaching your blood sugar goals . High blood sugars (greater than 180 mg/dL) can raise your risk of infections and slow your recovery, so you will need to focus on controlling your diabetes during the weeks before surgery. . Make sure that the doctor who takes care of your diabetes knows about your planned surgery including the date and location.  How do I manage my blood sugar  before surgery? . Check your blood sugar at least 4 times a day, starting 2 days before surgery, to make sure that the level is not too high or low. . Check your blood sugar the morning of your surgery when you wake up and every 2 hours until you get to the Short Stay unit. o If your blood sugar is less than 70 mg/dL, you will need to treat for low blood sugar: - Do not take insulin. - Treat a low blood sugar (less than 70 mg/dL) with  cup of clear juice (cranberry or apple), 4 glucose tablets, OR glucose gel. - Recheck blood sugar in 15 minutes after treatment (to make sure it is greater than 70 mg/dL). If your blood sugar is not greater than 70 mg/dL on recheck, call [phone] for further instructions. . Report your blood sugar to the short stay nurse when you get to Short Stay.  . If you are admitted to the hospital after surgery: o Your blood sugar will be checked by the staff and you will probably be given insulin after surgery (instead of oral diabetes medicines) to make sure you have good blood sugar levels. o The goal for blood sugar control after surgery is 80-180 mg/dL.             Do not wear jewelry            Do not wear lotions, powders, colognes, or deodorant.            Men may shave face and neck.  Do not bring valuables to the hospital.            [facility] is not responsible for any belongings or valuables.  Do NOT Smoke (Tobacco/Vaping) or drink Alcohol 24 hours prior to your procedure If you use a CPAP at night, you may bring all equipment for your overnight stay.   Contacts, glasses, dentures or bridgework may not be worn into surgery, please bring cases for these belongings   For patients admitted to the hospital, discharge time will be determined by your treatment team.   Patients discharged the day of surgery will not be allowed to drive home, and someone needs to stay with them for 24 hours.  Special instructions:   [facility]- Preparing For  Surgery  Before surgery, you can play an important role. Because skin is not sterile, your skin needs to be as free of germs as possible. You can reduce the number of germs on your skin by washing with CHG (chlorahexidine gluconate) Soap before surgery.  CHG is an antiseptic cleaner which kills germs and bonds with the skin to continue killing germs even after washing.    Oral Hygiene is also important to reduce your risk of infection.  Remember - BRUSH YOUR TEETH THE MORNING OF SURGERY WITH YOUR REGULAR TOOTHPASTE  Please do not use if you have an allergy to CHG or antibacterial soaps. If your skin becomes reddened/irritated stop using the CHG.  Do not shave (including legs and underarms) for at least 48 hours prior to first CHG shower. It is OK to shave your face.  Please follow these instructions carefully.   1. Shower the NIGHT BEFORE SURGERY and the MORNING OF SURGERY  2. Pat yourself dry with a CLEAN TOWEL.  3. Wear CLEAN PAJAMAS to bed the night before surgery  4. Place CLEAN SHEETS on your bed the night before your surgery  5. DO NOT SLEEP WITH PETS.  Day of Surgery: Shower  Wear Clean/Comfortable clothing the morning of surgery Do not apply any deodorants/lotions.   Remember to brush your teeth WITH YOUR REGULAR TOOTHPASTE.   Please read over the following fact sheets that you were given.

## 2020-08-26 ENCOUNTER — Encounter (HOSPITAL_COMMUNITY)
Admission: RE | Admit: 2020-08-26 | Discharge: 2020-08-26 | Disposition: A | Discharge status: Home / Self Care | Payer: Medicare Other | Source: Ambulatory Visit | Attending: Neurological Surgery | Admitting: Neurological Surgery

## 2020-08-26 ENCOUNTER — Other Ambulatory Visit: Payer: Self-pay

## 2020-08-26 ENCOUNTER — Encounter (HOSPITAL_COMMUNITY): Payer: Self-pay

## 2020-08-26 DIAGNOSIS — Z01812 Encounter for preprocedural laboratory examination: Secondary | ICD-10-CM | POA: Insufficient documentation

## 2020-08-26 DIAGNOSIS — Z20822 Contact with and (suspected) exposure to covid-19: Secondary | ICD-10-CM | POA: Diagnosis not present

## 2020-08-26 HISTORY — DX: Other specified postprocedural states: Z98.890

## 2020-08-26 HISTORY — DX: Hyperlipidemia, unspecified: E78.5

## 2020-08-26 HISTORY — DX: Other specified postprocedural states: R11.2

## 2020-08-26 HISTORY — DX: Pyothorax without fistula: J86.9

## 2020-08-26 HISTORY — DX: Myoneural disorder, unspecified: G70.9

## 2020-08-26 HISTORY — DX: Sepsis, unspecified organism: A41.9

## 2020-08-26 HISTORY — DX: Anemia, unspecified: D64.9

## 2020-08-26 HISTORY — DX: Cardiac arrhythmia, unspecified: I49.9

## 2020-08-26 LAB — BASIC METABOLIC PANEL
Anion gap: 8 (ref 5–15)
BUN: 20 mg/dL (ref 8–23)
CO2: 28 mmol/L (ref 22–32)
Calcium: 9.1 mg/dL (ref 8.9–10.3)
Chloride: 104 mmol/L (ref 98–111)
Creatinine, Ser: 1 mg/dL (ref 0.61–1.24)
GFR, Estimated: >60 mL/min (ref >60)
Glucose, Bld: 131 mg/dL — ABNORMAL HIGH (ref 70–99)
Potassium: 4.2 mmol/L (ref 3.5–5.1)
Sodium: 140 mmol/L (ref 135–145)

## 2020-08-26 LAB — CBC
HCT: 30.9 % — ABNORMAL LOW (ref 39.0–52.0)
Hemoglobin: 9.8 g/dL — ABNORMAL LOW (ref 13.0–17.0)
MCH: 32 pg (ref 26.0–34.0)
MCHC: 31.7 g/dL (ref 30.0–36.0)
MCV: 101 fL — ABNORMAL HIGH (ref 80.0–100.0)
Platelets: 175 10*3/uL (ref 150–400)
RBC: 3.06 MIL/uL — ABNORMAL LOW (ref 4.22–5.81)
RDW: 14.2 % (ref 11.5–15.5)
WBC: 7.4 10*3/uL (ref 4.0–10.5)
nRBC: 0 % (ref 0.0–0.2)

## 2020-08-26 LAB — GLUCOSE, CAPILLARY
Glucose-Capillary: 139 mg/dL — ABNORMAL HIGH (ref 70–99)
Glucose-Capillary: 139 mg/dL — ABNORMAL HIGH (ref 70–99)
Glucose-Capillary: 139 mg/dL — ABNORMAL HIGH (ref 70–99)
Glucose-Capillary: 139 mg/dL — ABNORMAL HIGH (ref 70–99)

## 2020-08-26 LAB — HEMOGLOBIN A1C
Hgb A1c MFr Bld: 6.5 % — ABNORMAL HIGH (ref 4.8–5.6)
Mean Plasma Glucose: 139.85 mg/dL

## 2020-08-26 LAB — SURGICAL PCR SCREEN
MRSA, PCR: NEGATIVE
Staphylococcus aureus: NEGATIVE

## 2020-08-26 LAB — SARS CORONAVIRUS 2 (TAT 6-24 HRS): SARS Coronavirus 2: NEGATIVE

## 2020-08-26 NOTE — Anesthesia Preprocedure Evaluation (Addendum)
Anesthesia Evaluation  Patient identified by MRN, date of birth, ID band Patient awake    Reviewed: Allergy & Precautions, NPO status , Patient's Chart, lab work & pertinent test results, reviewed documented beta blocker date and time   History of Anesthesia Complications (+) PONV and history of anesthetic complications  Airway Mallampati: II  TM Distance: >3 FB Neck ROM: Full    Dental  (+) Missing,    Pulmonary COPD,  COPD inhaler, former smoker,    Pulmonary exam normal        Cardiovascular hypertension, Pt. on medications and Pt. on home beta blockers Normal cardiovascular exam+ dysrhythmias Atrial Fibrillation      Neuro/Psych negative neurological ROS  negative psych ROS   GI/Hepatic negative GI ROS, Neg liver ROS,   Endo/Other  diabetes, Type 2, Oral Hypoglycemic Agents  Renal/GU negative Renal ROS  negative genitourinary   Musculoskeletal  (+) Arthritis ,   Abdominal   Peds  Hematology  (+) anemia , Hgb 9.8   Anesthesia Other Findings Day of surgery medications reviewed with patient.  Reproductive/Obstetrics negative OB ROS                          Anesthesia Physical Anesthesia Plan  ASA: II  Anesthesia Plan: General   Post-op Pain Management:    Induction: Intravenous  PONV Risk Score and Plan: 4 or greater and Treatment may vary due to age or medical condition, Ondansetron and Dexamethasone  Airway Management Planned: Oral ETT  Additional Equipment: None  Intra-op Plan:   Post-operative Plan: Extubation in OR  Informed Consent: I have reviewed the patients History and Physical, chart, labs and discussed the procedure including the risks, benefits and alternatives for the proposed anesthesia with the patient or authorized representative who has indicated his/her understanding and acceptance.     Dental advisory given  Plan Discussed with: CRNA  Anesthesia Plan  Comments: (PAT note written 08/26/2020 by Myra Gianotti, PA-C. History includes stage IIIC/IV lung cancer, anemia, PAF, psoriatic arthritis, DM2. H/H 9.8/30.9, consistent with labs from 06/08/20--deferred additional lab orders to neurosurgery.    )      Anesthesia Quick Evaluation

## 2020-08-26 NOTE — Progress Notes (Signed)
Anesthesia Chart Review:  Case: [id number] Date/Time: 08/30/20 1332   Procedure: Right Lumbar 3-4 Microdiscectomy (Right Back) - 3C/RM 19   Anesthesia type: General   Pre-op diagnosis: Herniated nucleus pulposus of lumbosacral region   Location: [facility] OR ROOM 18 / [facility]   Surgeons: Kristeen Miss, MD      DISCUSSION: Patient is a 75 year old male scheduled for the above procedure.  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  Preoperative cardiology input outlined by Coletta Memos, MD on 08/24/2020,  "Given past medical history and time since last visit, based on ACC/AHA guidelines, Jeremy [name] would be at acceptable risk for the planned procedure without further cardiovascular testing.   His RCRI is class I risk, 0.4% risk of major cardiac event."  Preoperative labs show H/H 9.8/30.9, consistent with previous results (9.9/31.1 on 06/08/20). PLT 175. H/H results called to Janett Billow at Dr. Clarice Pole office. At this point will defer to surgeon if he wants preoperative T&S.   08/26/2020 presurgical COVID-19 test in process. Anesthesiologist to evaluate on the day of surgery.    VS: BP 121/70   Pulse 74   Temp 36.5 C (Oral)   Resp 18   Ht 6\' 2"  (1.88 m)   Wt 85.3 kg   SpO2 94%   BMI 24.14 kg/m      PROVIDERS: [name], [name], MD is PCP  Curt Bears, MD is HEM-ONC. Last visit 06/10/2020.  Keytruda discontinued after cycle 1 secondary to significant skin rash.  He completed 6 cycles of carboplatin and paclitaxel, but currently on observation after developing speticiemia from MRSA infection onf the eye and hydropneumothorax. Gery Pray, MD is RAD-OND.  He completed palliative radiotherapy. Carlyle Dolly, MD is cardiologist.  Last visit 07/23/2020. Dwana Melena, MD is ophthalmologist ([place]) Kelton Pillar, MD is ID ([place])   LABS: Preoperative labs noted. See DISCUSSION. (all labs ordered are listed, but only abnormal results are displayed)  Labs Reviewed  GLUCOSE, CAPILLARY - Abnormal; Notable for the following components:      Result Value   Glucose-Capillary 139 (*)    All other components within normal limits  GLUCOSE, CAPILLARY - Abnormal; Notable for the following components:   Glucose-Capillary 139 (*)    All other components within normal limits  GLUCOSE, CAPILLARY - Abnormal; Notable for the following components:   Glucose-Capillary 139 (*)    All other components within normal limits  GLUCOSE, CAPILLARY - Abnormal; Notable for the following components:   Glucose-Capillary 139 (*)    All other components within normal limits  HEMOGLOBIN A1C - Abnormal; Notable for the following components:   Hgb A1c MFr Bld 6.5 (*)    All other components within normal limits  BASIC METABOLIC PANEL - Abnormal; Notable for the following components:   Glucose, Bld 131 (*)    All other components within normal limits  CBC - Abnormal; Notable for the following components:   RBC 3.06 (*)    Hemoglobin 9.8 (*)  HCT 30.9 (*)    MCV 101.0 (*)    All other components within normal limits  SURGICAL PCR SCREEN  SARS CORONAVIRUS 2 (TAT 6-24 HRS)     IMAGES: MRI L-spine 08/13/20 [facility] CE): Inferiorly migrated right L3-4 paracentral extrusion  abutting the L4  nerve root with mild spinal canal and right neural foraminal  narrowing.  Mild left L2-3 and bilateral L4-5 neural foraminal narrowing.    CT Chest/abd/pelvis 06/07/20:  IMPRESSION: 1. Redemonstrated small to moderate right pleural effusion, slightly decreased in volume compared to prior examination with persistent pleural thickening. There has been interval resolution of previously seen air loculations within this effusion. This generally remains consistent with a malignant effusion. 2. Post treatment appearance of the right chest is otherwise not significantly changed, with extensive fibrotic consolidation and volume loss of the right lower and right middle lobes as well as some evidence of septal thickening, again most likely reflecting a combination of lymphangitic spread of disease and post treatment consolidation/fibrosis. 3. Redemonstrated extensive, frothy debris within the right mainstem bronchus and more distal airways, suggesting ongoing aspiration. 4. Majority of previously seen pulmonary nodules in the left lobe have completely resolved or significantly improved, generally with irregular, somewhat bandlike residua, however there is a new cavitary nodule of the central left lower lobe measuring 1.5 x 1.5 cm. Findings are consistent with improved, but persistent and ongoing process, most likely infectious or inflammatory, septic emboli a strong differential consideration given nodule morphology. 5. No evidence of distant metastatic disease within the abdomen or pelvis. 6. Coronary artery disease. - Aortic Atherosclerosis (ICD10-I70.0).   EKG: EKG 02/07/20 [facility]): By Result Narrative: ST at 108 bpm EKG 01/20/20: NSR   CV: TEE 02/12/20 [facility] CE): SUMMARY  No valvular vegetation present.  Diffuse thickening of the aortic valve with preserved cusp opening.  There is mild aortic regurgitation.  Structurally normal tricuspid valve. There is mild tricuspid   regurgitation. There is mild tricuspid valve prolapse.  Left ventricular systolic function is normal. The left atrial size is  normal.  No thrombus is detected in the left atrial appendage.  Right atrial size is normal. A patent foramen ovale is present.  There is no comparison study available.   Echo TTE 02/10/20 [facility]): SUMMARY  - The left ventricular size is normal. Left ventricular systolic  function is normal. LV ejection fraction = 55-60%. Unable to fully  assess LV regional wall motion.  - The right ventricle is normal in size and function.  - Off axis views of the mitral and tricupsid valve wereobtained dueto  suboptimal iamge quality from on axis views. Thickened tricuspid  leaflets noted, no significant tricuspid regurgitation. Unable to  exclude a vegetation.  - Mitral leaflets open well, remaining two valves are not well  visualized.    Past Medical History:  Diagnosis Date  . Anemia   . Cancer ([place])    mass right lung  . COPD (chronic obstructive pulmonary disease) (HCC)    QUIT SMOKING 30 YRS AGO  . Diabetes ([place]) 2018  . Dysrhythmia    PAF  . Empyema ([place])   . HTN (hypertension)   . Hyperlipidemia   . Neuromuscular disorder ([place])   . PONV (postoperative nausea and vomiting)   . Psoriatic arthritis ([place]) 2015  . Sepsis ([place])    possibly from right port and traveled to eye    Past Surgical History:  Procedure Laterality Date  . BACK SURGERY  1990  . BIOPSY  12/30/2018  Procedure: BIOPSY;  Surgeon: Danie Binder, MD;  Location: [facility];  Service: Endoscopy;;  gastric  . CATARACT EXTRACTION Bilateral   . CERVICAL SPINE SURGERY  1995  . COLONOSCOPY WITH PROPOFOL N/A 12/30/2018   Procedure: COLONOSCOPY WITH PROPOFOL;  Surgeon: Danie Binder, MD;  Location: [facility];  Service: Endoscopy;  Laterality: N/A;  12:30pm  . ESOPHAGOGASTRODUODENOSCOPY (EGD) WITH PROPOFOL N/A 12/30/2018   Procedure: ESOPHAGOGASTRODUODENOSCOPY (EGD) WITH PROPOFOL;   Surgeon: Danie Binder, MD;  Location: [facility];  Service: Endoscopy;  Laterality: N/A;  . EYE SURGERY     detached retina  . IR IMAGING GUIDED PORT INSERTION  09/04/2019  . POLYPECTOMY  12/30/2018   Procedure: POLYPECTOMY;  Surgeon: Danie Binder, MD;  Location: [facility];  Service: Endoscopy;;  colon  . THORACENTESIS Right 08/08/2019   Procedure: Thoracentesis;  Surgeon: [name], MD;  Location: [facility];  Service: Pulmonary;  Laterality: Right;  Marland Kitchen VIDEO BRONCHOSCOPY Right 08/08/2019   Procedure: Video Bronchoscopy with Erbe Cryo Biopsy of Right mainstem;  Surgeon: [name], MD;  Location[facility];  Service: Pulmonary;  Laterality: Right;    MEDICATIONS: . acetaminophen (TYLENOL) 325 MG tablet  . albuterol (VENTOLIN HFA) 108 (90 Base) MCG/ACT inhaler  . cyclobenzaprine (FLEXERIL) 10 MG tablet  . diltiazem (CARDIZEM CD) 360 MG 24 hr capsule  . docusate sodium (COLACE) 100 MG capsule  . doxycycline (VIBRAMYCIN) 100 MG capsule  . ferrous sulfate 325 (65 FE) MG tablet  . folic acid (FOLVITE) 1 MG tablet  . gabapentin (NEURONTIN) 300 MG capsule  . metFORMIN (GLUCOPHAGE-XR) 500 MG 24 hr tablet  . methotrexate (RHEUMATREX) 2.5 MG tablet  . metoprolol tartrate (LOPRESSOR) 25 MG tablet  . metroNIDAZOLE (METROCREAM) 0.75 % cream  . mometasone (ELOCON) 0.1 % cream  . Multiple Vitamins-Minerals (CENTRUM ADULTS PO)  . Omega-3 Fatty Acids (FISH OIL) 1000 MG CAPS  . oxyCODONE-acetaminophen (PERCOCET/ROXICET) 5-325 MG tablet  . prednisoLONE acetate (PRED FORTE) 1 % ophthalmic suspension  . prochlorperazine (COMPAZINE) 10 MG tablet  . tamsulosin (FLOMAX) 0.4 MG CAPS capsule  . traMADol (ULTRAM) 50 MG tablet  . triamcinolone lotion (KENALOG) 0.1 %   No current facility-administered medications for this encounter.    Myra Gianotti, PA-C Surgical Short Stay/Anesthesiology [facility] Phone [phone] [facility] Phone [phone] 08/26/2020 1:57 PM

## 2020-08-26 NOTE — Progress Notes (Signed)
PCP - Consuello Masse Cardiologist - Carlyle Dolly  PPM/ICD - denies   Chest x-ray - n/a EKG - 01/20/2020 Stress Test - denies ECHO - 02/12/2020 (care everywhere) Cardiac Cath - denies  Sleep Study - denies  Fasting Blood Sugar - 113 Checks Blood Sugar once a day  Patient instructed to hold all Aspirin, NSAID's, herbal medications, fish oil and vitamins 7 days prior to surgery.   ERAS Protcol -no  COVID TEST- 08/26/2020   Anesthesia review: yes, 08/24/20 pre op clearance from dr. branch  Patient denies shortness of breath, fever, cough and chest pain at PAT appointment   All instructions explained to the patient, with a verbal understanding of the material. Patient agrees to go over the instructions while at home for a better understanding. Patient also instructed to self quarantine after being tested for COVID-19. The opportunity to ask questions was provided.

## 2020-08-30 ENCOUNTER — Other Ambulatory Visit: Payer: Self-pay

## 2020-08-30 ENCOUNTER — Ambulatory Visit (HOSPITAL_COMMUNITY): Payer: Medicare Other

## 2020-08-30 ENCOUNTER — Encounter (HOSPITAL_COMMUNITY)
Admission: RE | Disposition: A | Discharge status: Home / Self Care | Payer: Self-pay | Source: Home / Self Care | Attending: Neurological Surgery

## 2020-08-30 ENCOUNTER — Ambulatory Visit (HOSPITAL_COMMUNITY): Payer: Medicare Other | Admitting: Vascular Surgery

## 2020-08-30 ENCOUNTER — Ambulatory Visit (HOSPITAL_COMMUNITY): Payer: Medicare Other | Admitting: Certified Registered"

## 2020-08-30 ENCOUNTER — Encounter (HOSPITAL_COMMUNITY): Payer: Self-pay | Admitting: Neurological Surgery

## 2020-08-30 ENCOUNTER — Observation Stay (HOSPITAL_COMMUNITY)
Admission: RE | Admit: 2020-08-30 | Discharge: 2020-08-30 | Disposition: A | Discharge status: Home / Self Care | Payer: Medicare Other | Attending: Neurological Surgery | Admitting: Neurological Surgery

## 2020-08-30 DIAGNOSIS — Z85118 Personal history of other malignant neoplasm of bronchus and lung: Secondary | ICD-10-CM | POA: Diagnosis not present

## 2020-08-30 DIAGNOSIS — D630 Anemia in neoplastic disease: Secondary | ICD-10-CM | POA: Diagnosis not present

## 2020-08-30 DIAGNOSIS — M5126 Other intervertebral disc displacement, lumbar region: Secondary | ICD-10-CM | POA: Diagnosis present

## 2020-08-30 DIAGNOSIS — M5116 Intervertebral disc disorders with radiculopathy, lumbar region: Secondary | ICD-10-CM | POA: Diagnosis not present

## 2020-08-30 DIAGNOSIS — J910 Malignant pleural effusion: Secondary | ICD-10-CM | POA: Diagnosis not present

## 2020-08-30 DIAGNOSIS — Z981 Arthrodesis status: Secondary | ICD-10-CM | POA: Diagnosis not present

## 2020-08-30 DIAGNOSIS — C3491 Malignant neoplasm of unspecified part of right bronchus or lung: Secondary | ICD-10-CM | POA: Diagnosis not present

## 2020-08-30 DIAGNOSIS — Z419 Encounter for procedure for purposes other than remedying health state, unspecified: Secondary | ICD-10-CM

## 2020-08-30 HISTORY — PX: LUMBAR LAMINECTOMY/ DECOMPRESSION WITH MET-RX: SHX5959

## 2020-08-30 LAB — GLUCOSE, CAPILLARY
Glucose-Capillary: 135 mg/dL — ABNORMAL HIGH (ref 70–99)
Glucose-Capillary: 123 mg/dL — ABNORMAL HIGH (ref 70–99)
Glucose-Capillary: 160 mg/dL — ABNORMAL HIGH (ref 70–99)

## 2020-08-30 SURGERY — LUMBAR LAMINECTOMY/ DECOMPRESSION WITH MET-RX
Anesthesia: General | Site: Back | Laterality: Right

## 2020-08-30 MED ORDER — ACETAMINOPHEN 500 MG PO TABS: 1000.0000 mg | ORAL_TABLET | Freq: Once | ORAL | Status: DC

## 2020-08-30 MED ORDER — CEFAZOLIN SODIUM-DEXTROSE 2-4 GM/100ML-% IV SOLN
2.0000 g | Freq: Three times a day (TID) | INTRAVENOUS | Status: DC
Start: 2020-08-30 — End: 2020-08-31

## 2020-08-30 MED ORDER — ACETAMINOPHEN 325 MG PO TABS: 650.0000 mg | ORAL_TABLET | Freq: Once | ORAL | Status: AC

## 2020-08-30 MED ORDER — POLYETHYLENE GLYCOL 3350 17 G PO PACK: 17.0000 g | PACK | Freq: Every day | ORAL | Status: DC | PRN

## 2020-08-30 MED ORDER — DEXAMETHASONE SODIUM PHOSPHATE 10 MG/ML IJ SOLN
INTRAMUSCULAR | Status: DC | PRN
  Administered 2020-08-30: 10 mg via INTRAVENOUS

## 2020-08-30 MED ORDER — CEFAZOLIN SODIUM-DEXTROSE 2-4 GM/100ML-% IV SOLN
INTRAVENOUS | Status: AC
  Filled 2020-08-30: qty 100

## 2020-08-30 MED ORDER — DOCUSATE SODIUM 100 MG PO CAPS: 100.0000 mg | ORAL_CAPSULE | Freq: Every day | ORAL | Status: DC

## 2020-08-30 MED ORDER — SUGAMMADEX SODIUM 200 MG/2ML IV SOLN
INTRAVENOUS | Status: DC | PRN
  Administered 2020-08-30: 200 mg via INTRAVENOUS

## 2020-08-30 MED ORDER — ROCURONIUM 10MG/ML (10ML) SYRINGE FOR MEDFUSION PUMP - OPTIME
INTRAVENOUS | Status: DC | PRN
  Administered 2020-08-30: 100 mg via INTRAVENOUS

## 2020-08-30 MED ORDER — SODIUM CHLORIDE 0.9 % IV SOLN
250.0000 mL | INTRAVENOUS | Status: DC
  Administered 2020-08-30: 250 mL via INTRAVENOUS

## 2020-08-30 MED ORDER — LIDOCAINE-EPINEPHRINE 1 %-1:100000 IJ SOLN
INTRAMUSCULAR | Status: DC | PRN
  Administered 2020-08-30: 5 mL

## 2020-08-30 MED ORDER — SUFENTANIL CITRATE 50 MCG/ML IV SOLN
INTRAVENOUS | Status: AC
  Filled 2020-08-30: qty 1

## 2020-08-30 MED ORDER — METHOCARBAMOL 1000 MG/10ML IJ SOLN
500.0000 mg | Freq: Four times a day (QID) | INTRAVENOUS | Status: DC | PRN
  Filled 2020-08-30: qty 5

## 2020-08-30 MED ORDER — ONDANSETRON HCL 4 MG/2ML IJ SOLN: 4.0000 mg | Freq: Four times a day (QID) | INTRAMUSCULAR | Status: DC | PRN

## 2020-08-30 MED ORDER — FLEET ENEMA 7-19 GM/118ML RE ENEM: 1.0000 | ENEMA | Freq: Once | RECTAL | Status: DC | PRN

## 2020-08-30 MED ORDER — THROMBIN 5000 UNITS EX SOLR
OROMUCOSAL | Status: DC | PRN
  Administered 2020-08-30: 5 mL via TOPICAL

## 2020-08-30 MED ORDER — PROCHLORPERAZINE MALEATE 10 MG PO TABS
10.0000 mg | ORAL_TABLET | Freq: Four times a day (QID) | ORAL | Status: DC | PRN
  Filled 2020-08-30: qty 1

## 2020-08-30 MED ORDER — 0.9 % SODIUM CHLORIDE (POUR BTL) OPTIME
TOPICAL | Status: DC | PRN
  Administered 2020-08-30: 1000 mL

## 2020-08-30 MED ORDER — DEXAMETHASONE SODIUM PHOSPHATE 10 MG/ML IJ SOLN
INTRAMUSCULAR | Status: AC
  Filled 2020-08-30: qty 1

## 2020-08-30 MED ORDER — METOPROLOL TARTRATE 25 MG PO TABS: 25.0000 mg | ORAL_TABLET | Freq: Two times a day (BID) | ORAL | Status: DC

## 2020-08-30 MED ORDER — PHENOL 1.4 % MT LIQD: 1.0000 | OROMUCOSAL | Status: DC | PRN

## 2020-08-30 MED ORDER — OXYCODONE-ACETAMINOPHEN 5-325 MG PO TABS: 1.0000 | ORAL_TABLET | ORAL | Status: DC | PRN

## 2020-08-30 MED ORDER — PHENYLEPHRINE 40 MCG/ML (10ML) SYRINGE FOR IV PUSH (FOR BLOOD PRESSURE SUPPORT)
PREFILLED_SYRINGE | INTRAVENOUS | Status: AC
  Filled 2020-08-30: qty 10

## 2020-08-30 MED ORDER — SODIUM CHLORIDE 0.9% FLUSH: 3.0000 mL | Freq: Two times a day (BID) | INTRAVENOUS | Status: DC

## 2020-08-30 MED ORDER — PROMETHAZINE HCL 25 MG/ML IJ SOLN
6.2500 mg | INTRAMUSCULAR | Status: DC | PRN
Start: 2020-08-30 — End: 2020-08-30

## 2020-08-30 MED ORDER — PHENYLEPHRINE HCL (PRESSORS) 10 MG/ML IV SOLN
INTRAVENOUS | Status: DC | PRN
  Administered 2020-08-30: 80 ug via INTRAVENOUS

## 2020-08-30 MED ORDER — OXYCODONE HCL 5 MG PO TABS: 5.0000 mg | ORAL_TABLET | Freq: Once | ORAL | Status: DC | PRN

## 2020-08-30 MED ORDER — DOCUSATE SODIUM 100 MG PO CAPS: 100.0000 mg | ORAL_CAPSULE | Freq: Two times a day (BID) | ORAL | Status: DC

## 2020-08-30 MED ORDER — DOXYCYCLINE HYCLATE 100 MG PO TABS
100.0000 mg | ORAL_TABLET | Freq: Two times a day (BID) | ORAL | Status: DC
  Filled 2020-08-30: qty 1

## 2020-08-30 MED ORDER — BUPIVACAINE HCL (PF) 0.5 % IJ SOLN
INTRAMUSCULAR | Status: DC | PRN
  Administered 2020-08-30: 5 mL
  Administered 2020-08-30: 20 mL

## 2020-08-30 MED ORDER — METHOTREXATE 2.5 MG PO TABS: 10.0000 mg | ORAL_TABLET | ORAL | Status: DC

## 2020-08-30 MED ORDER — ROCURONIUM BROMIDE 10 MG/ML (PF) SYRINGE
PREFILLED_SYRINGE | INTRAVENOUS | Status: AC
  Filled 2020-08-30: qty 10

## 2020-08-30 MED ORDER — MENTHOL 3 MG MT LOZG: 1.0000 | LOZENGE | OROMUCOSAL | Status: DC | PRN

## 2020-08-30 MED ORDER — TRAMADOL HCL 50 MG PO TABS: 50.0000 mg | ORAL_TABLET | ORAL | Status: DC | PRN

## 2020-08-30 MED ORDER — METHOCARBAMOL 500 MG PO TABS: 500.0000 mg | ORAL_TABLET | Freq: Four times a day (QID) | ORAL | Status: DC | PRN

## 2020-08-30 MED ORDER — FENTANYL CITRATE (PF) 100 MCG/2ML IJ SOLN: 25.0000 ug | INTRAMUSCULAR | Status: DC | PRN

## 2020-08-30 MED ORDER — SODIUM CHLORIDE 0.9% FLUSH: 3.0000 mL | INTRAVENOUS | Status: DC | PRN

## 2020-08-30 MED ORDER — PROPOFOL 10 MG/ML IV BOLUS
INTRAVENOUS | Status: AC
  Filled 2020-08-30: qty 20

## 2020-08-30 MED ORDER — ACETAMINOPHEN 325 MG PO TABS
ORAL_TABLET | ORAL | Status: AC
  Administered 2020-08-30: 650 mg via ORAL
  Filled 2020-08-30: qty 2

## 2020-08-30 MED ORDER — METFORMIN HCL ER 500 MG PO TB24
500.0000 mg | ORAL_TABLET | Freq: Every day | ORAL | Status: DC
  Administered 2020-08-30: 500 mg via ORAL
  Filled 2020-08-30: qty 1

## 2020-08-30 MED ORDER — THROMBIN 5000 UNITS EX SOLR
CUTANEOUS | Status: AC
  Filled 2020-08-30: qty 5000

## 2020-08-30 MED ORDER — ORAL CARE MOUTH RINSE
15.0000 mL | Freq: Once | OROMUCOSAL | Status: AC
  Administered 2020-08-30: 15 mL via OROMUCOSAL

## 2020-08-30 MED ORDER — ACETAMINOPHEN 325 MG PO TABS: 650.0000 mg | ORAL_TABLET | ORAL | Status: DC | PRN

## 2020-08-30 MED ORDER — CYCLOBENZAPRINE HCL 10 MG PO TABS: 10.0000 mg | ORAL_TABLET | Freq: Every evening | ORAL | Status: DC | PRN

## 2020-08-30 MED ORDER — SENNA 8.6 MG PO TABS: 1.0000 | ORAL_TABLET | Freq: Two times a day (BID) | ORAL | Status: DC

## 2020-08-30 MED ORDER — PHENYLEPHRINE HCL-NACL 10-0.9 MG/250ML-% IV SOLN
INTRAVENOUS | Status: DC | PRN
  Administered 2020-08-30: 50 ug/min via INTRAVENOUS

## 2020-08-30 MED ORDER — BISACODYL 10 MG RE SUPP: 10.0000 mg | Freq: Every day | RECTAL | Status: DC | PRN

## 2020-08-30 MED ORDER — ACETAMINOPHEN 650 MG RE SUPP: 650.0000 mg | RECTAL | Status: DC | PRN

## 2020-08-30 MED ORDER — LIDOCAINE 2% (20 MG/ML) 5 ML SYRINGE
INTRAMUSCULAR | Status: AC
  Filled 2020-08-30: qty 5

## 2020-08-30 MED ORDER — ALUM & MAG HYDROXIDE-SIMETH 200-200-20 MG/5ML PO SUSP: 30.0000 mL | Freq: Four times a day (QID) | ORAL | Status: DC | PRN

## 2020-08-30 MED ORDER — DILTIAZEM HCL ER COATED BEADS 360 MG PO CP24: 360.0000 mg | ORAL_CAPSULE | Freq: Every day | ORAL | Status: DC

## 2020-08-30 MED ORDER — LACTATED RINGERS IV SOLN: INTRAVENOUS | Status: DC

## 2020-08-30 MED ORDER — BUPIVACAINE HCL (PF) 0.5 % IJ SOLN
INTRAMUSCULAR | Status: AC
  Filled 2020-08-30: qty 30

## 2020-08-30 MED ORDER — GABAPENTIN 300 MG PO CAPS: 600.0000 mg | ORAL_CAPSULE | Freq: Every day | ORAL | Status: DC

## 2020-08-30 MED ORDER — CEFAZOLIN SODIUM-DEXTROSE 2-4 GM/100ML-% IV SOLN
2.0000 g | INTRAVENOUS | Status: AC
  Administered 2020-08-30: 2 g via INTRAVENOUS

## 2020-08-30 MED ORDER — OXYCODONE HCL 5 MG/5ML PO SOLN
5.0000 mg | Freq: Once | ORAL | Status: DC | PRN
Start: 2020-08-30 — End: 2020-08-30

## 2020-08-30 MED ORDER — ONDANSETRON HCL 4 MG/2ML IJ SOLN
INTRAMUSCULAR | Status: DC | PRN
  Administered 2020-08-30: 4 mg via INTRAVENOUS

## 2020-08-30 MED ORDER — SUFENTANIL CITRATE 50 MCG/ML IV SOLN
INTRAVENOUS | Status: DC | PRN
  Administered 2020-08-30 (×2): 10 ug via INTRAVENOUS

## 2020-08-30 MED ORDER — METFORMIN HCL ER 500 MG PO TB24: 1000.0000 mg | ORAL_TABLET | Freq: Every day | ORAL | Status: DC

## 2020-08-30 MED ORDER — ONDANSETRON HCL 4 MG PO TABS: 4.0000 mg | ORAL_TABLET | Freq: Four times a day (QID) | ORAL | Status: DC | PRN

## 2020-08-30 MED ORDER — ALBUTEROL SULFATE HFA 108 (90 BASE) MCG/ACT IN AERS
2.0000 | INHALATION_SPRAY | Freq: Four times a day (QID) | RESPIRATORY_TRACT | Status: DC | PRN
  Filled 2020-08-30: qty 6.7

## 2020-08-30 MED ORDER — ONDANSETRON HCL 4 MG/2ML IJ SOLN
INTRAMUSCULAR | Status: AC
  Filled 2020-08-30: qty 2

## 2020-08-30 MED ORDER — LACTATED RINGERS IV SOLN: INTRAVENOUS | Status: DC | PRN

## 2020-08-30 MED ORDER — PREDNISOLONE ACETATE 1 % OP SUSP
1.0000 [drp] | Freq: Two times a day (BID) | OPHTHALMIC | Status: DC
  Filled 2020-08-30: qty 5

## 2020-08-30 MED ORDER — LIDOCAINE HCL (CARDIAC) PF 100 MG/5ML IV SOSY
PREFILLED_SYRINGE | INTRAVENOUS | Status: DC | PRN
  Administered 2020-08-30: 100 mg via INTRAVENOUS

## 2020-08-30 MED ORDER — LIDOCAINE-EPINEPHRINE 1 %-1:100000 IJ SOLN
INTRAMUSCULAR | Status: AC
  Filled 2020-08-30: qty 1

## 2020-08-30 MED ORDER — SODIUM CHLORIDE (PF) 0.9 % IJ SOLN
INTRAMUSCULAR | Status: AC
  Filled 2020-08-30: qty 10

## 2020-08-30 MED ORDER — PROPOFOL 10 MG/ML IV BOLUS
INTRAVENOUS | Status: DC | PRN
  Administered 2020-08-30: 160 mg via INTRAVENOUS

## 2020-08-30 MED ORDER — CHLORHEXIDINE GLUCONATE 0.12 % MT SOLN: 15.0000 mL | Freq: Once | OROMUCOSAL | Status: AC

## 2020-08-30 MED ORDER — TAMSULOSIN HCL 0.4 MG PO CAPS: 0.4000 mg | ORAL_CAPSULE | Freq: Every day | ORAL | Status: DC

## 2020-08-30 SURGICAL SUPPLY — 46 items
BAND RUBBER #18 3X1/16 STRL (MISCELLANEOUS) ×4 IMPLANT
BLADE CLIPPER SURG (BLADE) ×2 IMPLANT
BLADE SURG 15 STRL LF DISP TIS (BLADE) ×1 IMPLANT
BLADE SURG 15 STRL SS (BLADE) ×1
BUR PRECISION MATCH 2.5 (BURR) ×2 IMPLANT
CANISTER SUCT 3000ML PPV (MISCELLANEOUS) ×2 IMPLANT
COVER WAND RF STERILE (DRAPES) ×2 IMPLANT
DECANTER SPIKE VIAL GLASS SM (MISCELLANEOUS) ×2 IMPLANT
DERMABOND ADVANCED (GAUZE/BANDAGES/DRESSINGS) ×1
DERMABOND ADVANCED .7 DNX12 (GAUZE/BANDAGES/DRESSINGS) ×1 IMPLANT
DEVICE DISSECT PLASMABLAD 3.0S (MISCELLANEOUS) ×1 IMPLANT
DRAPE C-ARM 42X72 X-RAY (DRAPES) ×4 IMPLANT
DRAPE LAPAROTOMY 100X72X124 (DRAPES) ×2 IMPLANT
DRAPE MICROSCOPE LEICA (MISCELLANEOUS) ×2 IMPLANT
DURAPREP 6ML APPLICATOR 50/CS (WOUND CARE) ×2 IMPLANT
ELECT BLADE 6.5 EXT (BLADE) ×2 IMPLANT
ELECT REM PT RETURN 9FT ADLT (ELECTROSURGICAL) ×2
ELECTRODE REM PT RTRN 9FT ADLT (ELECTROSURGICAL) ×1 IMPLANT
GAUZE 4X4 16PLY RFD (DISPOSABLE) IMPLANT
GLOVE BIOGEL PI IND STRL 8.5 (GLOVE) ×1 IMPLANT
GLOVE BIOGEL PI INDICATOR 8.5 (GLOVE) ×1
GLOVE ECLIPSE 8.5 STRL (GLOVE) ×2 IMPLANT
GLOVE EXAM NITRILE XL STR (GLOVE) IMPLANT
GLOVE SURG UNDER POLY LF SZ6.5 (GLOVE) ×4 IMPLANT
GLOVE SURG UNDER POLY LF SZ8.5 (GLOVE) ×2 IMPLANT
GOWN STRL REUS W/ TWL LRG LVL3 (GOWN DISPOSABLE) IMPLANT
GOWN STRL REUS W/ TWL XL LVL3 (GOWN DISPOSABLE) ×1 IMPLANT
GOWN STRL REUS W/TWL 2XL LVL3 (GOWN DISPOSABLE) ×2 IMPLANT
GOWN STRL REUS W/TWL LRG LVL3 (GOWN DISPOSABLE)
GOWN STRL REUS W/TWL XL LVL3 (GOWN DISPOSABLE) ×1
HEMOSTAT POWDER KIT SURGIFOAM (HEMOSTASIS) ×2 IMPLANT
KIT BASIN OR (CUSTOM PROCEDURE TRAY) ×2 IMPLANT
KIT TURNOVER KIT B (KITS) ×2 IMPLANT
NEEDLE HYPO 18GX1.5 BLUNT FILL (NEEDLE) IMPLANT
NEEDLE HYPO 22GX1.5 SAFETY (NEEDLE) ×2 IMPLANT
NEEDLE SPNL 20GX3.5 QUINCKE YW (NEEDLE) ×2 IMPLANT
NS IRRIG 1000ML POUR BTL (IV SOLUTION) ×2 IMPLANT
PACK LAMINECTOMY NEURO (CUSTOM PROCEDURE TRAY) ×2 IMPLANT
PAD ARMBOARD 7.5X6 YLW CONV (MISCELLANEOUS) ×6 IMPLANT
PLASMABLADE 3.0S (MISCELLANEOUS) ×2
SUT VIC AB 3-0 SH 8-18 (SUTURE) ×2 IMPLANT
SUT VIC AB 4-0 RB1 18 (SUTURE) ×2 IMPLANT
SYR 5ML LL (SYRINGE) IMPLANT
TOWEL GREEN STERILE (TOWEL DISPOSABLE) ×2 IMPLANT
TOWEL GREEN STERILE FF (TOWEL DISPOSABLE) ×2 IMPLANT
WATER STERILE IRR 1000ML POUR (IV SOLUTION) ×2 IMPLANT

## 2020-08-30 NOTE — Interval H&P Note (Signed)
History and Physical Interval Note:  08/30/2020 1:00 PM  Jeremy [name]  has presented today for surgery, with the diagnosis of Herniated nucleus pulposus of lumbosacral region.  The various methods of treatment have been discussed with the patient and family. After consideration of risks, benefits and other options for treatment, the patient has consented to  Procedure(s) with comments: Right Lumbar 3-4 Microdiscectomy (Right) - 3C/RM 19 as a surgical intervention.  The patient's history has been reviewed, patient examined, no change in status, stable for surgery.  I have reviewed the patient's chart and labs.  Questions were answered to the patient's satisfaction.     Earleen Newport

## 2020-08-30 NOTE — Anesthesia Postprocedure Evaluation (Signed)
Anesthesia Post Note  Patient: Jeremy [name]  Procedure(s) Performed: Right Lumbar three-four Microdiscectomy (Right Back)     Patient location during evaluation: PACU Anesthesia Type: General Level of consciousness: awake and alert and oriented Pain management: pain level controlled Vital Signs Assessment: post-procedure vital signs reviewed and stable Respiratory status: spontaneous breathing, nonlabored ventilation and respiratory function stable Cardiovascular status: blood pressure returned to baseline Postop Assessment: no apparent nausea or vomiting Anesthetic complications: no   No complications documented.  Last Vitals:  Vitals:   08/30/20 1530 08/30/20 1614  BP: 123/68 132/73  Pulse: 70 70  Resp: 16 20  Temp:    SpO2: 96% 98%    Last Pain:  Vitals:   08/30/20 1515  TempSrc:   PainSc: 0-No pain                 [name]

## 2020-08-30 NOTE — Transfer of Care (Signed)
Immediate Anesthesia Transfer of Care Note  Patient: Jeremy [name]  Procedure(s) Performed: Right Lumbar three-four Microdiscectomy (Right Back)  Patient Location: PACU  Anesthesia Type:General  Level of Consciousness: awake, alert  and oriented  Airway & Oxygen Therapy: Patient Spontanous Breathing and Patient connected to nasal cannula oxygen  Post-op Assessment: Report given to RN, Post -op Vital signs reviewed and stable and Patient moving all extremities X 4  Post vital signs: Reviewed and stable  Last Vitals:  Vitals Value Taken Time  BP 119/70 08/30/20 1444  Temp    Pulse 71 08/30/20 1446  Resp 20 08/30/20 1446  SpO2 100 % 08/30/20 1446  Vitals shown include unvalidated device data.  Last Pain:  Vitals:   08/30/20 1130  TempSrc: Oral  PainSc:       Patients Stated Pain Goal: 3 (12/75/17 0017)  Complications: No complications documented.

## 2020-08-30 NOTE — Op Note (Signed)
Date of surgery: 08/30/2020 Preoperative diagnosis: Herniated nucleus pulposus L3-4 right with right lumbar radiculopathy Postoperative diagnosis: Same Procedure: Laminotomy discectomy with operating microscope microdissection technique L3-4 right Surgeon: Kristeen Miss Anesthesia: General endotracheal Indications: Jeremy [name] a 75 year old individual who has stage IV lung cancer.  Patient had developed severe and unrelenting back and right lower extremity pain.  He was evaluated with an MRI that demonstrates a large extruded fragment of disc at L3-4 on the right.  He is advised regarding surgical decompression and is taken to the operating room at this time.  Procedure: Patient was brought to the operating room supine on the stretcher.  After the smooth induction of general endotracheal anesthesia, he was carefully turned prone.  The back was prepped with alcohol DuraPrep and draped in a sterile fashion.  A needle was placed at L3-4 for localization and then a skin incision was created over this area after infiltrating with 10 cc of lidocaine with epinephrine mixed 50-50 with half percent Marcaine.  The dissection was carried down through the lumbodorsal fascia which was opened on the right side of the midline in a subperiosteal dissection was performed in the interlaminar space at L3-4.  Self-retaining McCullough retractor was placed into the wound.  A second localizing radiograph was obtained to verify the L3-4 space as a laminotomy was created in this region removing the inferior margin lamina of L3 out to the mesial wall of this facet.  A partial medial facetectomy was performed.  The ligament was taken up and reduced and the underlying common dural tube was noted to be bulged dorsally and laterally takeoff of the L4 nerve root was identified and this was carefully mobilized medially.  Underneath this was a substantial mass.  Mass was incised with a little vertical incision of using a 15 blade and  underneath this was able to remove in a piecemeal fashion several fragments of disc material.  The operating microscope was used for this procedure to enhance visualization.  Further dissection yielded a number of other fragments of disc material that were removed which led for to good decompression of the common dural tube and particularly the path of the L4 nerve root.  There was some subligamentous material that was noted to be cephalad to the disc space the disc space itself was opened and entered and evacuated of a substantial quantity of disc material.  Once the disc material was removed and the area was well decompressed hemostasis was carefully obtained in this region.  A total of 20 cc of half percent Marcaine was injected into the paraspinous muscular fascia and then the retractor was removed the microscope was removed and the lumbodorsal fascia was closed with #3-0 Vicryl suture in interrupted fashion.  3-0 Vicryl was used in the subcutaneous tissues and 4-0 Vicryl subcuticularly Dermabond was used on the skin.  Blood loss for the procedure was estimated at 25 cc.

## 2020-08-30 NOTE — Plan of Care (Signed)
Patient alert and oriented, voiding adequately, MAE well with no difficulty. Incision area cdi with no s/s of infection. Patient discharged home per order. Patient and spouse stated understanding of discharge instructions given. Patient has an appointment with Dr. Ellene Route
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
## 2020-08-30 NOTE — Evaluation (Addendum)
Physical Therapy Evaluation Patient Details Name: Jeremy [name] MRN: [id number] DOB: Jan 04, 1946 Today's Date: 08/30/2020   History of Present Illness  Pt adm with L3-4 herniated nucleus pulposus. Underwent L3-4 laminectomy and discectomy on 3/28. PMH - Stage IV lung CA, ACDF, copd, afib  Clinical Impression  Pt slightly unsteady with gait post-op which was improved with use of walker. Recommended to pt that he should use his walker at home and have supervision for mobility this evening until he feels steadier. Pt educated on back precautions. Unsure of how his compliance will be with these. Ready for dc home with wife from PT standpoint.      Follow Up Recommendations No PT follow up;Supervision for mobility/OOB (initially)    Equipment Recommendations  None recommended by PT    Recommendations for Other Services       Precautions / Restrictions Precautions Precautions: Back Precaution Booklet Issued: Yes (comment)      Mobility  Bed Mobility Overal bed mobility: Modified Independent             General bed mobility comments: Verbal cues for body mechanics but pt did not follow    Transfers Overall transfer level: Needs assistance Equipment used: None Transfers: Sit to/from Stand Sit to Stand: Supervision         General transfer comment: Supervision for safety  Ambulation/Gait Ambulation/Gait assistance: Supervision Gait Distance (Feet): 250 Feet Assistive device: Rolling walker (2 wheeled) Gait Pattern/deviations: Step-through pattern;Decreased stride length Gait velocity: decr Gait velocity interpretation: 1.31 - 2.62 ft/sec, indicative of limited community ambulator General Gait Details: Pt with slightly unsteady gait requiring walker and supervision for safety.  Stairs            Wheelchair Mobility    Modified Rankin (Stroke Patients Only)       Balance Overall balance assessment: Needs assistance Sitting-balance support: No upper  extremity supported;Feet supported Sitting balance-Leahy Scale: Good     Standing balance support: No upper extremity supported;During functional activity Standing balance-Leahy Scale: Fair                               Pertinent Vitals/Pain Pain Assessment: No/denies pain    Home Living Family/patient expects to be discharged to:: Private residence Living Arrangements: Spouse/significant other Available Help at Discharge: Family;Available 24 hours/day Type of Home: House Home Access: Stairs to enter Entrance Stairs-Rails: Right Entrance Stairs-Number of Steps: 2 Home Layout: One level Home Equipment: Cane - single point;Shower seat - built in;Grab bars - tub/shower;Toilet riser;Other (comment);Walker - 2 wheels      Prior Function Level of Independence: Needs assistance   Gait / Transfers Assistance Needed: independent without assistive device           Hand Dominance   Dominant Hand: Right    Extremity/Trunk Assessment   Upper Extremity Assessment Upper Extremity Assessment: Overall WFL for tasks assessed    Lower Extremity Assessment Lower Extremity Assessment: Overall WFL for tasks assessed       Communication   Communication: No difficulties  Cognition Arousal/Alertness: Awake/alert Behavior During Therapy: WFL for tasks assessed/performed Overall Cognitive Status: Within Functional Limits for tasks assessed                                        General Comments      Exercises  Assessment/Plan    PT Assessment Patent does not need any further PT services  PT Problem List         PT Treatment Interventions      PT Goals (Current goals can be found in the Care Plan section)  Acute Rehab PT Goals PT Goal Formulation: All assessment and education complete, DC therapy    Frequency     Barriers to discharge        Co-evaluation               AM-PAC PT "6 Clicks" Mobility  Outcome Measure Help  needed turning from your back to your side while in a flat bed without using bedrails?: None Help needed moving from lying on your back to sitting on the side of a flat bed without using bedrails?: None Help needed moving to and from a bed to a chair (including a wheelchair)?: A Little Help needed standing up from a chair using your arms (e.g., wheelchair or bedside chair)?: A Little Help needed to walk in hospital room?: A Little Help needed climbing 3-5 steps with a railing? : A Little 6 Click Score: 20    End of Session   Activity Tolerance: Patient tolerated treatment well Patient left: in bed;with call bell/phone within reach;with family/visitor present Nurse Communication: Mobility status PT Visit Diagnosis: Unsteadiness on feet (R26.81)    Time: 0981-1914 PT Time Calculation (min) (ACUTE ONLY): 17 min   Charges:   PT Evaluation $PT Eval Low Complexity: 1 Low          [place] Pager [phone] [place] 08/30/2020, 5:55 PM

## 2020-08-30 NOTE — Discharge Summary (Signed)
Physician Discharge Summary  Patient ID: Jeremy [name] MRN: [id number] DOB/AGE: Mar 14, 1946 75 y.o.  Admit date: 08/30/2020 Discharge date: 08/30/2020  Admission Diagnoses: Herniated nucleus pulposus L3-4 right with right lumbar radiculopathy  Discharge Diagnoses: Herniated nucleus pulposus L3-4 right with right lumbar radiculopathy Active Problems:   Herniated nucleus pulposus, L3-4   Discharged Condition: good  Hospital Course: Patient was admitted to undergo surgical decompression L3-4 on the right knee tolerated surgery well.  Consults: None  Significant Diagnostic Studies: None  Treatments: surgery: Microdiscectomy L3-4 right  Discharge Exam: Blood pressure 132/73, pulse 70, temperature (!) 97 F (36.1 C), resp. rate 20, height 6\' 2"  (1.88 m), weight 84.8 kg, SpO2 98 %. Incision is clean and dry motor function is intact  Disposition: Discharge disposition: 01-Home or Self Care       Discharge Instructions    Call MD for:  redness, tenderness, or signs of infection (pain, swelling, redness, odor or green/yellow discharge around incision site)   Complete by: As directed    Call MD for:  severe uncontrolled pain   Complete by: As directed    Call MD for:  temperature >100.4   Complete by: As directed    Diet - low sodium heart healthy   Complete by: As directed    Discharge wound care:   Complete by: As directed    Okay to shower. Do not apply salves or appointments to incision. No heavy lifting with the upper extremities greater than 10 pounds. May resume driving when not requiring pain medication and patient feels comfortable with doing so.   Incentive spirometry RT   Complete by: As directed    Increase activity slowly   Complete by: As directed      Allergies as of 08/30/2020      Reactions   Pembrolizumab Rash   Naproxen Sodium Hives, Rash, Swelling   Lips and mouth      Medication List    TAKE these medications   acetaminophen 325 MG  tablet Commonly known as: TYLENOL Take 2 tablets (650 mg total) by mouth every 6 (six) hours as needed for mild pain, fever or headache.   albuterol 108 (90 Base) MCG/ACT inhaler Commonly known as: VENTOLIN HFA Inhale 2 puffs into the lungs every 6 (six) hours as needed for wheezing or shortness of breath.   CENTRUM ADULTS PO Take 1 tablet by mouth daily at 6 (six) AM.   cyclobenzaprine 10 MG tablet Commonly known as: FLEXERIL Take 10 mg by mouth at bedtime as needed for muscle spasms.   diltiazem 360 MG 24 hr capsule Commonly known as: CARDIZEM CD Take 1 capsule (360 mg total) by mouth daily.   docusate sodium 100 MG capsule Commonly known as: COLACE Take 100 mg by mouth daily.   doxycycline 100 MG capsule Commonly known as: VIBRAMYCIN Take 100 mg by mouth 2 (two) times daily.   ferrous sulfate 325 (65 FE) MG tablet Take 325 mg by mouth daily with supper.   Fish Oil 1000 MG Caps Take 1,000 mg by mouth daily.   folic acid 1 MG tablet Commonly known as: FOLVITE Take 1 mg by mouth daily.   gabapentin 300 MG capsule Commonly known as: NEURONTIN Take 600 mg by mouth at bedtime.   metFORMIN 500 MG 24 hr tablet Commonly known as: GLUCOPHAGE-XR Take 500-1,000 mg by mouth See admin instructions. Take 2 tablets (1000 mg) by mouth in the morning & 1 tablet (500 mg) by mouth at night.  methotrexate 2.5 MG tablet Commonly known as: RHEUMATREX Take 10 mg by mouth every Sunday. Caution:Chemotherapy. Protect from light.   metoprolol tartrate 25 MG tablet Commonly known as: LOPRESSOR Take 1 tablet (25 mg total) by mouth 2 (two) times daily.   metroNIDAZOLE 0.75 % cream Commonly known as: METROCREAM Apply 1 application topically 2 (two) times daily as needed (facial rosacea).   mometasone 0.1 % cream Commonly known as: ELOCON Apply 1 application topically 2 (two) times daily as needed (psoriasis).   oxyCODONE-acetaminophen 5-325 MG tablet Commonly known as:  PERCOCET/ROXICET Take 1 tablet by mouth every 4 (four) hours as needed for severe pain.   prednisoLONE acetate 1 % ophthalmic suspension Commonly known as: PRED FORTE Place 1 drop into the left eye in the morning and at bedtime.   prochlorperazine 10 MG tablet Commonly known as: COMPAZINE Take 1 tablet (10 mg total) by mouth every 6 (six) hours as needed for nausea or vomiting.   tamsulosin 0.4 MG Caps capsule Commonly known as: FLOMAX Take 0.4 mg by mouth daily.   traMADol 50 MG tablet Commonly known as: ULTRAM Take 50 mg by mouth every 4 (four) hours as needed for moderate pain. for pain   triamcinolone lotion 0.1 % Commonly known as: KENALOG Apply 1 application topically 4 (four) times daily.            Discharge Care Instructions  (From admission, onward)         Start     Ordered   08/30/20 0000  Discharge wound care:       Comments: Okay to shower. Do not apply salves or appointments to incision. No heavy lifting with the upper extremities greater than 10 pounds. May resume driving when not requiring pain medication and patient feels comfortable with doing so.   08/30/20 1840           Signed: Earleen Newport 08/30/2020, 6:40 PM

## 2020-08-31 ENCOUNTER — Encounter (HOSPITAL_COMMUNITY): Payer: Self-pay | Admitting: Neurological Surgery

## 2020-09-01 DIAGNOSIS — I10 Essential (primary) hypertension: Secondary | ICD-10-CM | POA: Diagnosis not present

## 2020-09-01 DIAGNOSIS — E1165 Type 2 diabetes mellitus with hyperglycemia: Secondary | ICD-10-CM | POA: Diagnosis not present

## 2020-09-02 DIAGNOSIS — M5127 Other intervertebral disc displacement, lumbosacral region: Secondary | ICD-10-CM | POA: Diagnosis not present

## 2020-09-03 ENCOUNTER — Telehealth: Payer: Self-pay | Admitting: Internal Medicine

## 2020-09-03 NOTE — Telephone Encounter (Signed)
R/s appt per 4/1 sch msg. Pt's wife is aware.

## 2020-09-06 ENCOUNTER — Other Ambulatory Visit: Payer: Medicare Other

## 2020-09-06 ENCOUNTER — Ambulatory Visit (HOSPITAL_COMMUNITY): Admission: RE | Admit: 2020-09-06 | Payer: Medicare Other | Source: Ambulatory Visit

## 2020-09-06 ENCOUNTER — Ambulatory Visit (HOSPITAL_COMMUNITY): Payer: Medicare Other

## 2020-09-08 ENCOUNTER — Ambulatory Visit: Payer: Medicare Other | Admitting: Internal Medicine

## 2020-09-15 ENCOUNTER — Telehealth: Payer: Self-pay | Admitting: Internal Medicine

## 2020-09-15 NOTE — Telephone Encounter (Signed)
Scheduled appt per 4/4 sch msg. Pt aware.

## 2020-09-23 ENCOUNTER — Other Ambulatory Visit: Payer: Self-pay | Admitting: Cardiology

## 2020-09-25 IMAGING — PT NM PET TUM IMG INITIAL (PI) SKULL BASE T - THIGH
7 series · 25 of 25 positions shown · non-contrast
Comparison: CT chest 08/06/2019.  Abdominopelvic CT 321.

CLINICAL DATA: Initial treatment strategy for non-small-cell lung
cancer, staging. [id number] vaccine 3 weeks ago. Radiation therapy in
[REDACTED].

EXAM:
NUCLEAR MEDICINE PET SKULL BASE TO THIGH
TECHNIQUE: 8.3 mCi F-18 FDG was injected intravenously. Full-ring PET imaging
was performed from the skull base to thigh after the radiotracer. CT
data was obtained and used for attenuation correction and anatomic
localization.
Fasting blood glucose: 84 mg/dl

[Series 3: pet sk_thigh ac · axial · 5.0mm · 4.07mm/px · z∈[-930,+34]mm · 5 of 242 slices shown]
[im 1/242]
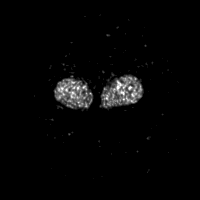
[im 61/242]
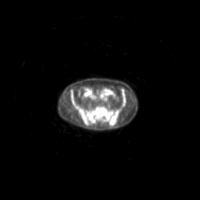
[im 121/242]
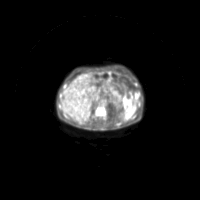
[im 181/242]
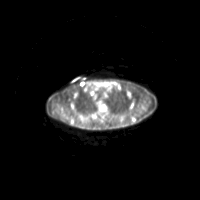
[im 242/242]
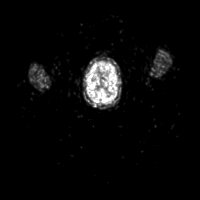

[Series 4: ct sk_thigh 5.0 b31f · axial · 5.0mm · 0.98mm/px · z∈[-930,+34]mm · 5 of 242 slices shown]
[im 1/242]
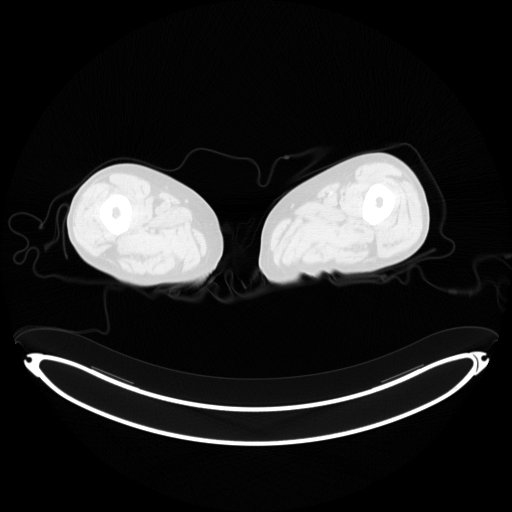
[im 61/242]
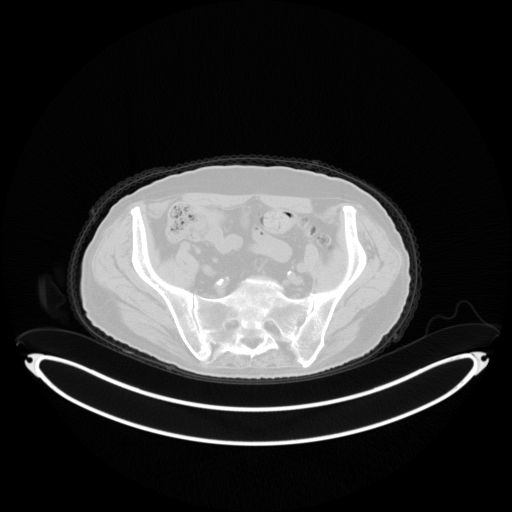
[im 121/242]
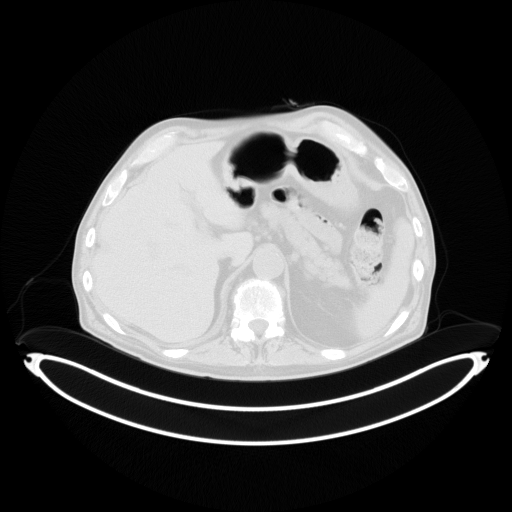
[im 181/242]
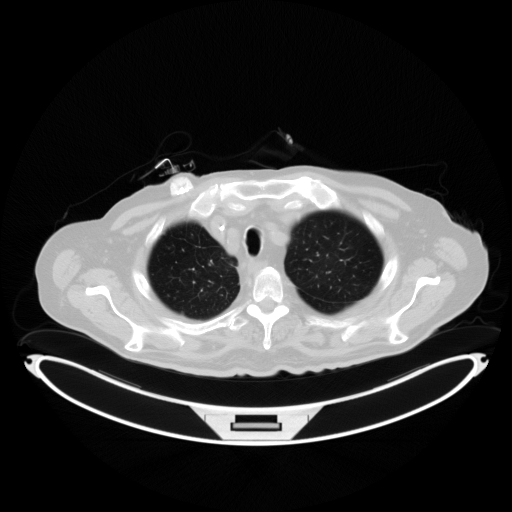
[im 242/242  brain]
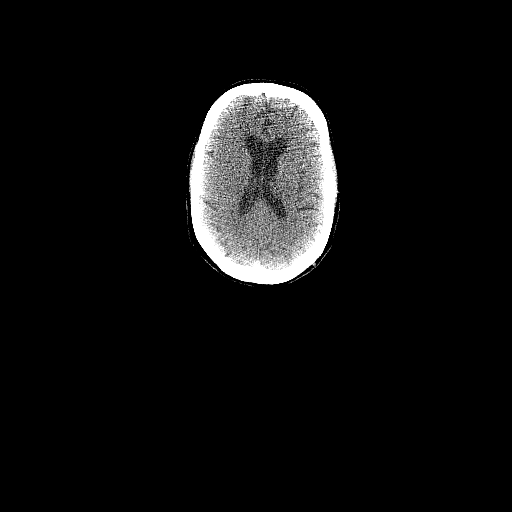

[Series 5: pet sk_thigh nac · axial · 5.0mm · 4.07mm/px · z∈[-930,+34]mm · 5 of 242 slices shown]
[im 1/242]
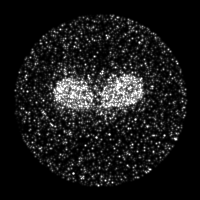
[im 61/242]
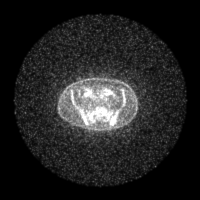
[im 121/242]
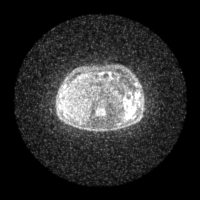
[im 181/242]
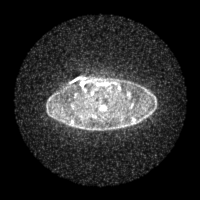
[im 242/242]
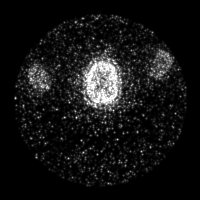

[Series 8: ct sk_thigh 5.0 b70f (id)_bone · axial · 5.0mm · 0.69mm/px · z∈[-456,-148]mm · 2 of 78 slices shown]
[im 1/78  bone]
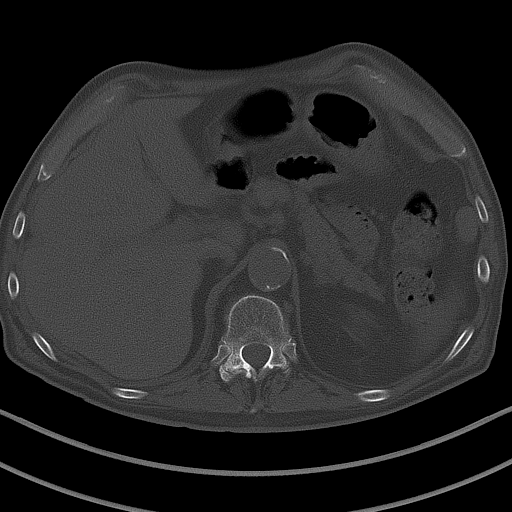
[im 78/78  bone]
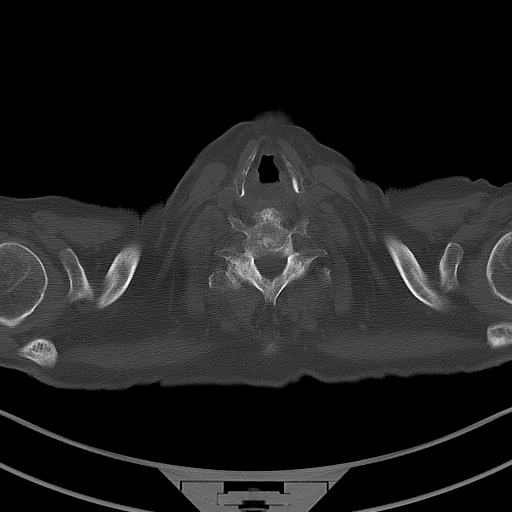

[Series 603: mip range 3 · coronal · 2.00mm/px · 1 of 32 slices shown]
[im 1/32]
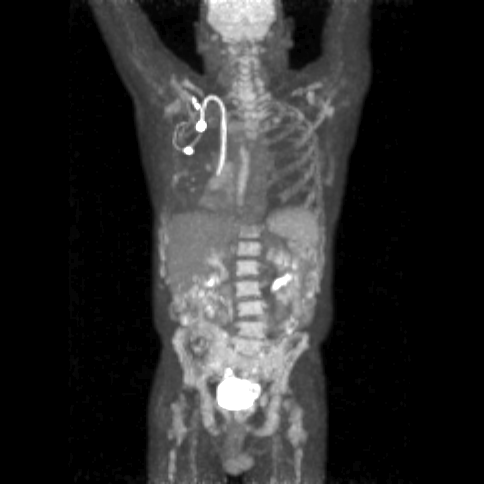

[Series 604: range-ct sk_thigh 5.0 (id)<alpha range> · 2 of 80 slices shown (1 of 2)]
[im 1/80]
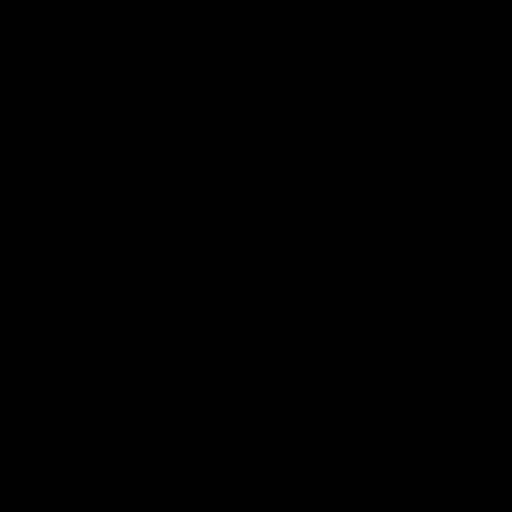
[im 80/80]
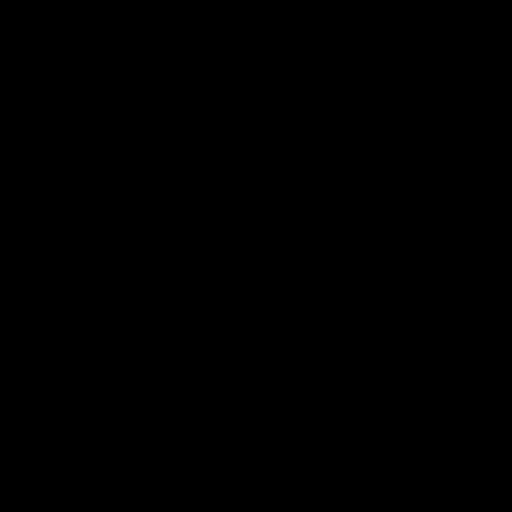

[Series 605: range-ct sk_thigh 5.0 (id)<alpha range> · 5 of 232 slices shown (2 of 2)]
[im 1/232]
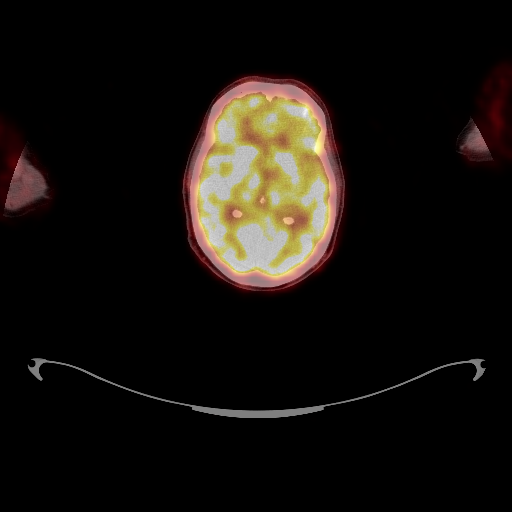
[im 58/232]
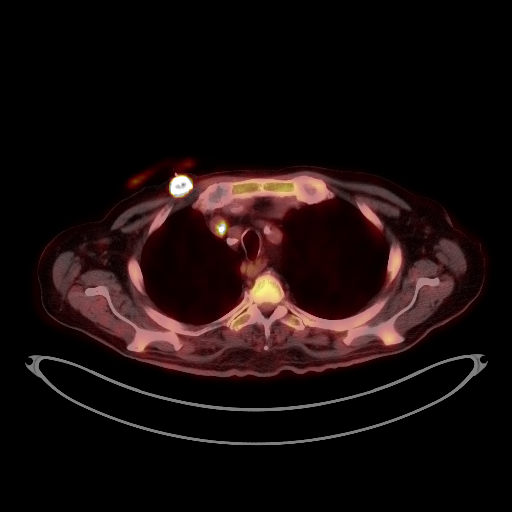
[im 116/232]
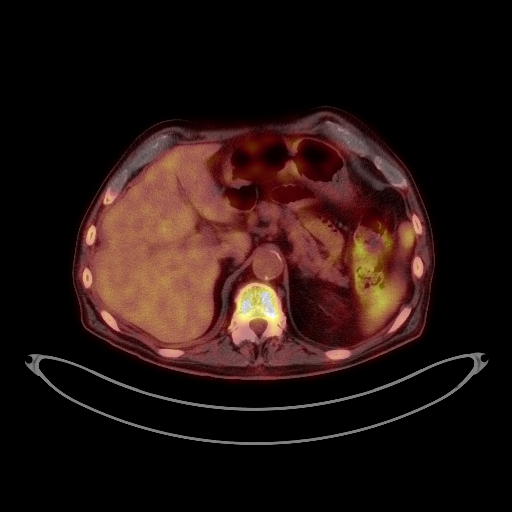
[im 174/232]
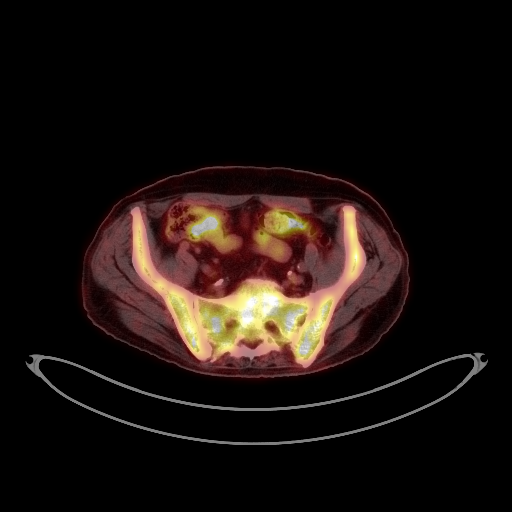
[im 232/232]
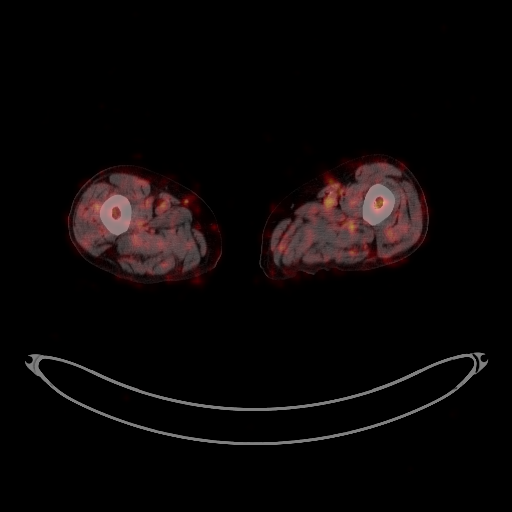

[25 of 25 positions shown; findings below may reference images not displayed]

FINDINGS: Mediastinal blood pool activity: SUV max

Liver activity: SUV max NA

NECK: No areas of abnormal hypermetabolism.

Incidental CT findings: No cervical adenopathy.

CHEST: No thoracic nodal hypermetabolism. Multifocal esophageal
hypermetabolism may be radiation induced. Example at a S.U.V. max of
5.4 including on 82/4.

Significant improvement since 08/06/2019 in previously described
necrotic central right lower lobe lung mass. Residual peripherally
hypermetabolic mass measures on the order of 6.2 x 4.7 cm and a
S.U.V. max of 4.4 including on 94/4. Direct mediastinal extension
has improved.

Hypermetabolism corresponding to inferior right upper lobe
nodularity and interstitial thickening. Example hypermetabolic
nodule at 5 mm and a S.U.V. max of 2.8 on 90/4.

Incidental CT findings: Right Port-A-Cath tip low SVC. Aortic and
coronary artery atherosclerosis. Small right pleural effusion
persists.

Centrilobular emphysema. Left lower lobe 4 mm nodule anteriorly on
34/8, present previously.

ABDOMEN/PELVIS: No abdominopelvic parenchymal or nodal
hypermetabolism.

Incidental CT findings: Normal adrenal glands. Large colonic stool
burden. Abdominal aortic atherosclerosis. Extensive colonic
diverticulosis.

SKELETON: There is diffuse marrow hypermetabolism, save relative
photopenia at the level of mid to lower thoracic radiation exposure.

Incidental CT findings: none
IMPRESSION: 1. Since the CT of 08/06/2019, response to therapy of central right
lower lobe lung mass. Persistent hypermetabolic anterior right lung
base nodularity, which remains suspicious for metastatic disease.
2. No thoracic nodal hypermetabolism.
3. Esophageal hypermetabolism, possibly representing radiation
induced esophagitis.
4. Diffuse marrow hypermetabolism, presumably due to marrow
stimulation by chemotherapy (pharmacy note describes chemotherapy
beginning last week). This limits sensitivity for osseous
metastasis.
5. Incidental findings, including: Similar small right pleural
effusion. Aortic atherosclerosis (J7AC8-L71.1), coronary artery
atherosclerosis and emphysema (J7AC8-CIB.T). Possible constipation.

## 2020-10-04 ENCOUNTER — Other Ambulatory Visit: Payer: Self-pay

## 2020-10-04 ENCOUNTER — Encounter (HOSPITAL_COMMUNITY): Payer: Self-pay

## 2020-10-04 ENCOUNTER — Inpatient Hospital Stay: Payer: Medicare Other | Attending: Internal Medicine

## 2020-10-04 ENCOUNTER — Ambulatory Visit (HOSPITAL_COMMUNITY)
Admission: RE | Admit: 2020-10-04 | Discharge: 2020-10-04 | Disposition: A | Discharge status: Home / Self Care | Payer: Medicare Other | Source: Ambulatory Visit | Attending: Internal Medicine | Admitting: Internal Medicine

## 2020-10-04 DIAGNOSIS — K5730 Diverticulosis of large intestine without perforation or abscess without bleeding: Secondary | ICD-10-CM | POA: Diagnosis not present

## 2020-10-04 DIAGNOSIS — E119 Type 2 diabetes mellitus without complications: Secondary | ICD-10-CM | POA: Diagnosis not present

## 2020-10-04 DIAGNOSIS — Z7984 Long term (current) use of oral hypoglycemic drugs: Secondary | ICD-10-CM | POA: Insufficient documentation

## 2020-10-04 DIAGNOSIS — C3491 Malignant neoplasm of unspecified part of right bronchus or lung: Secondary | ICD-10-CM | POA: Insufficient documentation

## 2020-10-04 DIAGNOSIS — E785 Hyperlipidemia, unspecified: Secondary | ICD-10-CM | POA: Insufficient documentation

## 2020-10-04 DIAGNOSIS — G629 Polyneuropathy, unspecified: Secondary | ICD-10-CM | POA: Insufficient documentation

## 2020-10-04 DIAGNOSIS — M2550 Pain in unspecified joint: Secondary | ICD-10-CM | POA: Insufficient documentation

## 2020-10-04 DIAGNOSIS — R5383 Other fatigue: Secondary | ICD-10-CM | POA: Diagnosis not present

## 2020-10-04 DIAGNOSIS — Z7952 Long term (current) use of systemic steroids: Secondary | ICD-10-CM | POA: Insufficient documentation

## 2020-10-04 DIAGNOSIS — Z79899 Other long term (current) drug therapy: Secondary | ICD-10-CM | POA: Diagnosis not present

## 2020-10-04 DIAGNOSIS — Z87891 Personal history of nicotine dependence: Secondary | ICD-10-CM | POA: Diagnosis not present

## 2020-10-04 DIAGNOSIS — I10 Essential (primary) hypertension: Secondary | ICD-10-CM | POA: Diagnosis not present

## 2020-10-04 DIAGNOSIS — C3490 Malignant neoplasm of unspecified part of unspecified bronchus or lung: Secondary | ICD-10-CM | POA: Diagnosis not present

## 2020-10-04 DIAGNOSIS — J449 Chronic obstructive pulmonary disease, unspecified: Secondary | ICD-10-CM | POA: Diagnosis not present

## 2020-10-04 DIAGNOSIS — J90 Pleural effusion, not elsewhere classified: Secondary | ICD-10-CM | POA: Diagnosis not present

## 2020-10-04 DIAGNOSIS — I700 Atherosclerosis of aorta: Secondary | ICD-10-CM | POA: Diagnosis not present

## 2020-10-04 DIAGNOSIS — I2510 Atherosclerotic heart disease of native coronary artery without angina pectoris: Secondary | ICD-10-CM | POA: Diagnosis not present

## 2020-10-04 LAB — CMP (CANCER CENTER ONLY)
ALT: 20 U/L (ref 0–44)
AST: 13 U/L — ABNORMAL LOW (ref 15–41)
Albumin: 3 g/dL — ABNORMAL LOW (ref 3.5–5.0)
Alkaline Phosphatase: 62 U/L (ref 38–126)
Anion gap: 10 (ref 5–15)
BUN: 33 mg/dL — ABNORMAL HIGH (ref 8–23)
CO2: 27 mmol/L (ref 22–32)
Calcium: 9.2 mg/dL (ref 8.9–10.3)
Chloride: 100 mmol/L (ref 98–111)
Creatinine: 0.83 mg/dL (ref 0.61–1.24)
GFR, Estimated: >60 mL/min (ref >60)
Glucose, Bld: 163 mg/dL — ABNORMAL HIGH (ref 70–99)
Potassium: 4.7 mmol/L (ref 3.5–5.1)
Sodium: 137 mmol/L (ref 135–145)
Total Bilirubin: 0.4 mg/dL (ref 0.3–1.2)
Total Protein: 7.2 g/dL (ref 6.5–8.1)

## 2020-10-04 LAB — CBC WITH DIFFERENTIAL (CANCER CENTER ONLY)
Abs Immature Granulocytes: 0.58 10*3/uL — ABNORMAL HIGH (ref 0.00–0.07)
Basophils Absolute: 0 10*3/uL (ref 0.0–0.1)
Basophils Relative: 0 %
Eosinophils Absolute: 0.2 10*3/uL (ref 0.0–0.5)
Eosinophils Relative: 1 %
HCT: 29.2 % — ABNORMAL LOW (ref 39.0–52.0)
Hemoglobin: 9.5 g/dL — ABNORMAL LOW (ref 13.0–17.0)
Immature Granulocytes: 4 %
Lymphocytes Relative: 6 %
Lymphs Abs: 0.9 10*3/uL (ref 0.7–4.0)
MCH: 32.4 pg (ref 26.0–34.0)
MCHC: 32.5 g/dL (ref 30.0–36.0)
MCV: 99.7 fL (ref 80.0–100.0)
Monocytes Absolute: 0.8 10*3/uL (ref 0.1–1.0)
Monocytes Relative: 5 %
Neutro Abs: 13.7 10*3/uL — ABNORMAL HIGH (ref 1.7–7.7)
Neutrophils Relative %: 84 %
Platelet Count: 171 10*3/uL (ref 150–400)
RBC: 2.93 MIL/uL — ABNORMAL LOW (ref 4.22–5.81)
RDW: 17.1 % — ABNORMAL HIGH (ref 11.5–15.5)
WBC Count: 16.1 10*3/uL — ABNORMAL HIGH (ref 4.0–10.5)
nRBC: 0 % (ref 0.0–0.2)

## 2020-10-04 MED ORDER — IOHEXOL 300 MG/ML  SOLN
100.0000 mL | Freq: Once | INTRAMUSCULAR | Status: AC | PRN
  Administered 2020-10-04: 100 mL via INTRAVENOUS

## 2020-10-06 ENCOUNTER — Encounter: Payer: Self-pay | Admitting: Internal Medicine

## 2020-10-06 ENCOUNTER — Inpatient Hospital Stay (HOSPITAL_BASED_OUTPATIENT_CLINIC_OR_DEPARTMENT_OTHER): Payer: Medicare Other | Admitting: Internal Medicine

## 2020-10-06 ENCOUNTER — Other Ambulatory Visit: Payer: Self-pay

## 2020-10-06 VITALS — BP 118/65 | HR 72 | Temp 97.3°F | Resp 20 | Ht 74.0 in | Wt 192.5 lb

## 2020-10-06 DIAGNOSIS — I10 Essential (primary) hypertension: Secondary | ICD-10-CM | POA: Diagnosis not present

## 2020-10-06 DIAGNOSIS — J449 Chronic obstructive pulmonary disease, unspecified: Secondary | ICD-10-CM | POA: Diagnosis not present

## 2020-10-06 DIAGNOSIS — E785 Hyperlipidemia, unspecified: Secondary | ICD-10-CM | POA: Diagnosis not present

## 2020-10-06 DIAGNOSIS — G629 Polyneuropathy, unspecified: Secondary | ICD-10-CM | POA: Diagnosis not present

## 2020-10-06 DIAGNOSIS — C3491 Malignant neoplasm of unspecified part of right bronchus or lung: Secondary | ICD-10-CM

## 2020-10-06 DIAGNOSIS — R5383 Other fatigue: Secondary | ICD-10-CM | POA: Diagnosis not present

## 2020-10-06 DIAGNOSIS — C3490 Malignant neoplasm of unspecified part of unspecified bronchus or lung: Secondary | ICD-10-CM

## 2020-10-06 NOTE — Progress Notes (Signed)
[place] [phone]   [phone]  OFFICE PROGRESS NOTE  [name], MD [address] 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  PD-L1 expression 20%.  PRIOR THERAPY: Palliative radiotherapy under the care of Dr. Sondra Come to the obstructive right lower lobe lung mass.  CURRENT THERAPY: Systemic chemotherapy with carboplatin for AUC of 5, paclitaxel 175 mg/M2 and Keytruda 200 mg IV every 3 weeks with Neulasta support.  First dose September 02, 2019.  Status post 6 cycles.  His treatment is currently on hold.  INTERVAL HISTORY: Jeremy [name] 75 y.o. male returns to the clinic today for follow-up visit accompanied by his wife.  The patient is feeling fine today with no concerning complaints except for mild shortness of breath with exertion.  He had several surgery for his back and hip recently.  He denied having any current chest pain, cough or hemoptysis.  He denied having any fever or chills.  He has no nausea, vomiting, diarrhea or constipation.  He has no headache or visual changes.  He denied having any recent weight loss or night sweats.  He was on treatment with prednisone for several weeks.  The patient had repeat CT scan of the chest, abdomen and pelvis performed recently and he is here for evaluation and discussion of his scan results and recommendation regarding his condition.   MEDICAL HISTORY: Past Medical History:  Diagnosis Date  . Anemia   . Cancer ([place])    mass right lung  . COPD (chronic obstructive pulmonary disease) ([facility])    QUIT SMOKING 30 YRS AGO  . Diabetes ([facility]) 2018  . Dysrhythmia    PAF  . Empyema ([facility])   . HTN (hypertension)   . Hyperlipidemia   . Neuromuscular disorder ([facility])   . PONV (postoperative nausea and vomiting)   . Psoriatic arthritis ([facility]) 2015  . Sepsis ([facility] [place])    possibly from right port  and traveled to eye    ALLERGIES:  is allergic to pembrolizumab and naproxen sodium.  MEDICATIONS:  Current Outpatient Medications  Medication Sig Dispense Refill  . acetaminophen (TYLENOL) 325 MG tablet Take 2 tablets (650 mg total) by mouth every 6 (six) hours as needed for mild pain, fever or headache. 120 tablet 0  . albuterol (VENTOLIN HFA) 108 (90 Base) MCG/ACT inhaler Inhale 2 puffs into the lungs every 6 (six) hours as needed for wheezing or shortness of breath.    . cyclobenzaprine (FLEXERIL) 10 MG tablet Take 10 mg by mouth at bedtime as needed for muscle spasms.    Marland Kitchen diltiazem (CARDIZEM CD) 360 MG 24 hr capsule TAKE 1 CAPSULE BY MOUTH EVERY DAY 30 capsule 6  . docusate sodium (COLACE) 100 MG capsule Take 100 mg by mouth daily.    Marland Kitchen doxycycline (VIBRAMYCIN) 100 MG capsule Take 100 mg by mouth 2 (two) times daily.    . ferrous sulfate 325 (65 FE) MG tablet Take 325 mg by mouth daily with supper.    . folic acid (FOLVITE) 1 MG tablet Take 1 mg by mouth daily.    Marland Kitchen gabapentin (NEURONTIN) 300 MG capsule Take 600 mg by mouth at bedtime.    . metFORMIN (GLUCOPHAGE-XR) 500 MG 24 hr tablet Take 500-1,000 mg by mouth See admin instructions. Take 2 tablets (1000 mg) by mouth in the morning & 1 tablet (500 mg) by  mouth at night.    . methotrexate (RHEUMATREX) 2.5 MG tablet Take 10 mg by mouth every Sunday. Caution:Chemotherapy. Protect from light.    . metoprolol tartrate (LOPRESSOR) 25 MG tablet Take 1 tablet (25 mg total) by mouth 2 (two) times daily. 180 tablet 3  . metroNIDAZOLE (METROCREAM) 0.75 % cream Apply 1 application topically 2 (two) times daily as needed (facial rosacea).     . mometasone (ELOCON) 0.1 % cream Apply 1 application topically 2 (two) times daily as needed (psoriasis).     . Multiple Vitamins-Minerals (CENTRUM ADULTS PO) Take 1 tablet by mouth daily at 6 (six) AM.    . Omega-3 Fatty Acids (FISH OIL) 1000 MG CAPS Take 1,000 mg by mouth daily.     Marland Kitchen  oxyCODONE-acetaminophen (PERCOCET/ROXICET) 5-325 MG tablet Take 1 tablet by mouth every 4 (four) hours as needed for severe pain.    . prednisoLONE acetate (PRED FORTE) 1 % ophthalmic suspension Place 1 drop into the left eye in the morning and at bedtime.    . prochlorperazine (COMPAZINE) 10 MG tablet Take 1 tablet (10 mg total) by mouth every 6 (six) hours as needed for nausea or vomiting. 30 tablet 0  . tamsulosin (FLOMAX) 0.4 MG CAPS capsule Take 0.4 mg by mouth daily.    . traMADol (ULTRAM) 50 MG tablet Take 50 mg by mouth every 4 (four) hours as needed for moderate pain. for pain    . triamcinolone lotion (KENALOG) 0.1 % Apply 1 application topically 4 (four) times daily. (Patient not taking: Reported on 08/24/2020) 120 mL 1   No current facility-administered medications for this visit.    SURGICAL HISTORY:  Past Surgical History:  Procedure Laterality Date  . BACK SURGERY  1990  . BIOPSY  12/30/2018   Procedure: BIOPSY;  Surgeon: Danie Binder, MD;  [facility];  Service: Endoscopy;;  gastric  . CATARACT EXTRACTION Bilateral   . CERVICAL SPINE SURGERY  1995  . COLONOSCOPY WITH PROPOFOL N/A 12/30/2018   Procedure: COLONOSCOPY WITH PROPOFOL;  Surgeon: Danie Binder, MD;  [facility];  Service: Endoscopy;  Laterality: N/A;  12:30pm  . ESOPHAGOGASTRODUODENOSCOPY (EGD) WITH PROPOFOL N/A 12/30/2018   Procedure: ESOPHAGOGASTRODUODENOSCOPY (EGD) WITH PROPOFOL;  Surgeon: Danie Binder, MD;  [facility];  Service: Endoscopy;  Laterality: N/A;  . EYE SURGERY     detached retina  . IR IMAGING GUIDED PORT INSERTION  09/04/2019  . LUMBAR LAMINECTOMY/ DECOMPRESSION WITH MET-RX Right 08/30/2020   Procedure: Right Lumbar three-four Microdiscectomy;  Surgeon: Kristeen Miss, MD;  Location: [facility];  Service: Neurosurgery;  Laterality: Right;  . POLYPECTOMY  12/30/2018   Procedure: POLYPECTOMY;  Surgeon: Danie Binder, MD;  [facility];  Service:  Endoscopy;;  colon  . THORACENTESIS Right 08/08/2019   Procedure: Thoracentesis;  Surgeon: [name], MD;  Location: [facility];  Service: Pulmonary;  Laterality: Right;  Marland Kitchen VIDEO BRONCHOSCOPY Right 08/08/2019   Procedure: Video Bronchoscopy with Erbe Cryo Biopsy of Right mainstem;  Surgeon: [name], MD;  Location[facility];  Service: Pulmonary;  Laterality: Right;    REVIEW OF SYSTEMS:  Constitutional: positive for fatigue Eyes: negative Ears, nose, mouth, throat, and face: negative Respiratory: positive for dyspnea on exertion Cardiovascular: negative Gastrointestinal: negative Genitourinary:negative Integument/breast: negative Hematologic/lymphatic: negative Musculoskeletal:positive for arthralgias Neurological: negative Behavioral/Psych: negative Endocrine: negative Allergic/Immunologic: negative   PHYSICAL EXAMINATION: General appearance: alert, cooperative, fatigued and no distress Head: Normocephalic, without obvious abnormality, atraumatic  Neck: no adenopathy, no JVD, supple, symmetrical, trachea midline and thyroid not enlarged, symmetric, no tenderness/mass/nodules Lymph nodes: Cervical, supraclavicular, and axillary nodes normal. Resp: clear to auscultation bilaterally Back: symmetric, no curvature. ROM normal. No CVA tenderness. Cardio: regular rate and rhythm, S1, S2 normal, no murmur, click, rub or gallop GI: soft, non-tender; bowel sounds normal; no masses,  no organomegaly Extremities: extremities normal, atraumatic, no cyanosis or edema Neurologic: Alert and oriented X 3, normal strength and tone. Normal symmetric reflexes. Normal coordination and gait  ECOG PERFORMANCE STATUS: 1 - Symptomatic but completely ambulatory  Blood pressure 118/65, pulse 72, temperature (!) 97.3 F (36.3 C), temperature source Tympanic, resp. rate 20, height '6\' 2"'  (1.88 m), weight 192 lb 8 oz (87.3 kg), SpO2 98 %.  LABORATORY DATA: Lab Results  Component Value Date   WBC 16.1 (H)  10/04/2020   HGB 9.5 (L) 10/04/2020   HCT 29.2 (L) 10/04/2020   MCV 99.7 10/04/2020   PLT 171 10/04/2020      Chemistry      Component Value Date/Time   NA 137 10/04/2020 0946   K 4.7 10/04/2020 0946   CL 100 10/04/2020 0946   CO2 27 10/04/2020 0946   BUN 33 (H) 10/04/2020 0946   CREATININE 0.83 10/04/2020 0946      Component Value Date/Time   CALCIUM 9.2 10/04/2020 0946   ALKPHOS 62 10/04/2020 0946   AST 13 (L) 10/04/2020 0946   ALT 20 10/04/2020 0946   BILITOT 0.4 10/04/2020 0946       RADIOGRAPHIC STUDIES: CT Chest W Contrast  Result Date: 10/04/2020 CLINICAL DATA:  Non-small-cell lung cancer.  Restaging. EXAM: CT CHEST, ABDOMEN, AND PELVIS WITH CONTRAST TECHNIQUE: Multidetector CT imaging of the chest, abdomen and pelvis was performed following the standard protocol during bolus administration of intravenous contrast. CONTRAST:  176m OMNIPAQUE IOHEXOL 300 MG/ML  SOLN COMPARISON:  06/07/2020 FINDINGS: CT CHEST FINDINGS Cardiovascular: The heart size is normal. No substantial pericardial effusion. Coronary artery calcification is evident. Atherosclerotic calcification is noted in the wall of the thoracic aorta. Mediastinum/Nodes: No mediastinal lymphadenopathy. There is no hilar lymphadenopathy. The esophagus has normal imaging features. There is no axillary lymphadenopathy. Lungs/Pleura: 6.2 x 5.0 cm cavitary mass in the left lower lobe has increased from 1.4 x 1.3 cm previously. 7 mm left upper lobe pulmonary nodule is similar to prior. Volume loss right hemithorax compatible with right lower lobectomy bronchiectasis and scarring in the right lung is probably treatment related and similar to prior study. Confluent airspace disease at the right base seen previously has decreased in the interval. Stable right pleural effusion. Musculoskeletal: No worrisome lytic or sclerotic osseous abnormality. CT ABDOMEN PELVIS FINDINGS Hepatobiliary: No suspicious focal abnormality within the liver  parenchyma. There is no evidence for gallstones, gallbladder wall thickening, or pericholecystic fluid. No intrahepatic or extrahepatic biliary dilation. Pancreas: No focal mass lesion. No dilatation of the main duct. No intraparenchymal cyst. No peripancreatic edema. Spleen: No splenomegaly. No focal mass lesion. Adrenals/Urinary Tract: No adrenal nodule or mass. Stable subcapsular 10 mm low-density lesion lower pole left kidney, likely a cyst. Right kidney unremarkable No evidence for hydroureter. The urinary bladder appears normal for the degree of distention. Stomach/Bowel: Stomach is unremarkable. No gastric wall thickening. No evidence of outlet obstruction. Duodenum is normally positioned as is the ligament of Treitz. Duodenal diverticulum noted. No small bowel wall thickening. No small bowel dilatation. The terminal ileum is normal. No gross colonic mass. No colonic wall thickening. Diverticular changes  are noted in the left colon without evidence of diverticulitis. Vascular/Lymphatic: There is abdominal aortic atherosclerosis without aneurysm. There is no gastrohepatic or hepatoduodenal ligament lymphadenopathy. No retroperitoneal or mesenteric lymphadenopathy. No pelvic sidewall lymphadenopathy. Reproductive: The prostate gland and seminal vesicles are unremarkable. Other: No intraperitoneal free fluid. Musculoskeletal: No worrisome lytic or sclerotic osseous abnormality. IMPRESSION: 1. Interval marked increase in size of the cavitary left lower lobe pulmonary mass. This could represent metastatic progression, metachronous primary, or infectious/inflammatory etiology. Tissue sampling may be warranted. 2. Post treatment changes in the right lung with improved aeration at the right base. 3. Stable 7 mm left upper lobe pulmonary nodule. 4. No evidence for metastatic disease in the abdomen or pelvis. 5. Stable right pleural effusion. 6. Left colonic diverticulosis without diverticulitis. 7. Aortic  Atherosclerosis (ICD10-I70.0). Electronically Signed   By: Misty Stanley M.D.   On: 10/04/2020 16:22   CT Abdomen Pelvis W Contrast  Result Date: 10/04/2020 : CLINICAL DATA: Non-small-cell lung cancer. Restaging. EXAM: CT CHEST, ABDOMEN, AND PELVIS WITH CONTRAST TECHNIQUE: Multidetector CT imaging of the chest, abdomen and pelvis was performed following the standard protocol during bolus administration of intravenous contrast. CONTRAST: 159m OMNIPAQUE IOHEXOL 300 MG/ML SOLN COMPARISON: 06/07/2020 FINDINGS: CT CHEST FINDINGS Cardiovascular: The heart size is normal. No substantial pericardial effusion. Coronary artery calcification is evident. Atherosclerotic calcification is noted in the wall of the thoracic aorta. Mediastinum/Nodes: No mediastinal lymphadenopathy. There is no hilar lymphadenopathy. The esophagus has normal imaging features. There is no axillary lymphadenopathy. Lungs/Pleura: 6.2 x 5.0 cm cavitary mass in the left lower lobe has increased from 1.4 x 1.3 cm previously. 7 mm left upper lobe pulmonary nodule is similar to prior. Volume loss right hemithorax compatible with right lower lobectomy bronchiectasis and scarring in the right lung is probably treatment related and similar to prior study. Confluent airspace disease at the right base seen previously has decreased in the interval. Stable right pleural effusion. Musculoskeletal: No worrisome lytic or sclerotic osseous abnormality. CT ABDOMEN PELVIS FINDINGS Hepatobiliary: No suspicious focal abnormality within the liver parenchyma. There is no evidence for gallstones, gallbladder wall thickening, or pericholecystic fluid. No intrahepatic or extrahepatic biliary dilation. Pancreas: No focal mass lesion. No dilatation of the main duct. No intraparenchymal cyst. No peripancreatic edema. Spleen: No splenomegaly. No focal mass lesion. Adrenals/Urinary Tract: No adrenal nodule or mass. Stable subcapsular 10 mm low-density lesion lower pole left  kidney, likely a cyst. Right kidney unremarkable No evidence for hydroureter. The urinary bladder appears normal for the degree of distention. Stomach/Bowel: Stomach is unremarkable. No gastric wall thickening. No evidence of outlet obstruction. Duodenum is normally positioned as is the ligament of Treitz. Duodenal diverticulum noted. No small bowel wall thickening. No small bowel dilatation. The terminal ileum is normal. No gross colonic mass. No colonic wall thickening. Diverticular changes are noted in the left colon without evidence of diverticulitis. Vascular/Lymphatic: There is abdominal aortic atherosclerosis without aneurysm. There is no gastrohepatic or hepatoduodenal ligament lymphadenopathy. No retroperitoneal or mesenteric lymphadenopathy. No pelvic sidewall lymphadenopathy. Reproductive: The prostate gland and seminal vesicles are unremarkable. Other: No intraperitoneal free fluid. Musculoskeletal: No worrisome lytic or sclerotic osseous abnormality. IMPRESSION: 1. Interval marked increase in size of the cavitary left lower lobe pulmonary mass. This could represent metastatic progression, metachronous primary, or infectious/inflammatory etiology. Tissue sampling may be warranted. 2. Post treatment changes in the right lung with improved aeration at the right base. 3. Stable 7 mm left upper lobe pulmonary nodule. 4. No evidence for metastatic  disease in the abdomen or pelvis. 5. Stable right pleural effusion. 6. Left colonic diverticulosis without diverticulitis. 7. Aortic Atherosclerosis (ICD10-I70.0). Electronically Signed   By: Misty Stanley M.D.   On: 10/04/2020 16:30    ASSESSMENT AND PLAN: This is a very pleasant 75 years old white male with stage IIIc/IV (T4, N2, M0/M1 a) non-small cell lung cancer, squamous cell carcinoma presented with large necrotic mass extending to the carina with associated obstruction of the right lower lobe bronchus and postobstructive collapse/consolidation and  necrosis in the right lower lobe as well as mediastinal lymphadenopathy with 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   The patient has PD-L1 expression of 20%. The patient completed a course of palliative radiotherapy to the obstructive right lower lobe lung mass under the care of Dr. Sondra Come.  He is feeling a little bit better with less shortness of breath and chest pain. The patient is currently undergoing systemic chemotherapy with carboplatin and paclitaxel.  Status post 6 cycles.  His treatment with [name] was discontinued after cycle #1 secondary to significant skin rash.  He was treated with a tapered dose of prednisone. He continues his treatment with carboplatin and paclitaxel for a total of 6 cycles.  The patient is currently on observation and he has been doing fine with no concerning complaints except for mild shortness of breath and arthralgia. He had repeat CT scan of the chest, abdomen pelvis performed recently.  I personally and independently reviewed the scan images and discussed the result and showed the images to the patient and his wife today. Has a scan showed marked increase in the size of the cavitary left lower lobe pulmonary mass suspicious for metastatic disease versus metachronous primary. I recommended for the patient to have a PET scan for further evaluation of this lesion and if needed we will consider it for repeat biopsy based on the PET scan results. I will see the patient back for follow-up visit in 2 weeks for evaluation and discussion of his PET scan results and further recommendation regarding his condition. The patient was advised to call immediately if he has any other concerning symptoms in the interval. The patient voices understanding of current disease status and treatment options and is in agreement with the current care plan.  All questions were answered. The patient knows to call the clinic with any problems, questions or  concerns. We can certainly see the patient much sooner if necessary.  Disclaimer: This note was dictated with voice recognition software. Similar sounding words can inadvertently be transcribed and may not be corrected upon review.

## 2020-10-11 DIAGNOSIS — R7881 Bacteremia: Secondary | ICD-10-CM | POA: Diagnosis not present

## 2020-10-11 DIAGNOSIS — Z923 Personal history of irradiation: Secondary | ICD-10-CM | POA: Diagnosis not present

## 2020-10-11 DIAGNOSIS — Z9221 Personal history of antineoplastic chemotherapy: Secondary | ICD-10-CM | POA: Diagnosis not present

## 2020-10-11 DIAGNOSIS — J869 Pyothorax without fistula: Secondary | ICD-10-CM | POA: Diagnosis not present

## 2020-10-11 DIAGNOSIS — C3490 Malignant neoplasm of unspecified part of unspecified bronchus or lung: Secondary | ICD-10-CM | POA: Diagnosis not present

## 2020-10-15 ENCOUNTER — Other Ambulatory Visit: Payer: Self-pay

## 2020-10-15 ENCOUNTER — Ambulatory Visit (HOSPITAL_COMMUNITY)
Admission: RE | Admit: 2020-10-15 | Discharge: 2020-10-15 | Disposition: A | Discharge status: Home / Self Care | Payer: Medicare Other | Source: Ambulatory Visit | Attending: Internal Medicine | Admitting: Internal Medicine

## 2020-10-15 DIAGNOSIS — C3491 Malignant neoplasm of unspecified part of right bronchus or lung: Secondary | ICD-10-CM | POA: Insufficient documentation

## 2020-10-15 DIAGNOSIS — R918 Other nonspecific abnormal finding of lung field: Secondary | ICD-10-CM | POA: Diagnosis not present

## 2020-10-15 DIAGNOSIS — Z79899 Other long term (current) drug therapy: Secondary | ICD-10-CM | POA: Insufficient documentation

## 2020-10-15 DIAGNOSIS — C3490 Malignant neoplasm of unspecified part of unspecified bronchus or lung: Secondary | ICD-10-CM

## 2020-10-15 LAB — GLUCOSE, CAPILLARY: Glucose-Capillary: 115 mg/dL — ABNORMAL HIGH (ref 70–99)

## 2020-10-15 MED ORDER — FLUDEOXYGLUCOSE F - 18 (FDG) INJECTION
9.7000 | Freq: Once | INTRAVENOUS | Status: AC
  Administered 2020-10-15: 9.8 via INTRAVENOUS

## 2020-10-19 ENCOUNTER — Inpatient Hospital Stay (HOSPITAL_BASED_OUTPATIENT_CLINIC_OR_DEPARTMENT_OTHER): Payer: Medicare Other | Admitting: Internal Medicine

## 2020-10-19 ENCOUNTER — Other Ambulatory Visit: Payer: Self-pay | Admitting: Radiology

## 2020-10-19 ENCOUNTER — Encounter: Payer: Self-pay | Admitting: Internal Medicine

## 2020-10-19 ENCOUNTER — Other Ambulatory Visit: Payer: Self-pay

## 2020-10-19 ENCOUNTER — Encounter (HOSPITAL_COMMUNITY): Payer: Self-pay

## 2020-10-19 ENCOUNTER — Telehealth (HOSPITAL_COMMUNITY): Payer: Self-pay

## 2020-10-19 VITALS — BP 114/65 | HR 52 | Temp 98.9°F | Resp 16 | Ht 74.0 in | Wt 192.4 lb

## 2020-10-19 DIAGNOSIS — R5383 Other fatigue: Secondary | ICD-10-CM | POA: Diagnosis not present

## 2020-10-19 DIAGNOSIS — J449 Chronic obstructive pulmonary disease, unspecified: Secondary | ICD-10-CM | POA: Diagnosis not present

## 2020-10-19 DIAGNOSIS — C3491 Malignant neoplasm of unspecified part of right bronchus or lung: Secondary | ICD-10-CM | POA: Diagnosis not present

## 2020-10-19 DIAGNOSIS — G629 Polyneuropathy, unspecified: Secondary | ICD-10-CM | POA: Diagnosis not present

## 2020-10-19 DIAGNOSIS — E785 Hyperlipidemia, unspecified: Secondary | ICD-10-CM | POA: Diagnosis not present

## 2020-10-19 DIAGNOSIS — I10 Essential (primary) hypertension: Secondary | ICD-10-CM | POA: Diagnosis not present

## 2020-10-19 NOTE — Progress Notes (Signed)
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  PD-L1 expression 20%.  PRIOR THERAPY: Palliative radiotherapy under the care of Dr. Sondra Come to the obstructive right lower lobe lung mass.  CURRENT THERAPY: Systemic chemotherapy with carboplatin for AUC of 5, paclitaxel 175 mg/M2 and Keytruda 200 mg IV every 3 weeks with Neulasta support.  First dose September 02, 2019.  Status post 6 cycles.  His treatment is currently on hold.  INTERVAL HISTORY: Jeremy [name] 75 y.o. male returns to the clinic today for follow-up visit.  The patient is feeling fine today with no concerning complaints except for the peripheral neuropathy.  He is currently on gabapentin 600 mg p.o. nightly and he sometimes misses his doses.  He denied having any chest pain, shortness of breath, cough or hemoptysis.  He denied having any fever or chills.  He has no nausea, vomiting, diarrhea or constipation.  He was found on previous CT scan of the chest to have a mass in the left lower lobe of the lung.  The patient had a PET scan performed recently and he is here for evaluation and discussion of his PET scan results and recommendation regarding treatment of his condition.  MEDICAL HISTORY: Past Medical History:  Diagnosis Date  . Anemia   . Cancer ([place])    mass right lung  . COPD (chronic obstructive pulmonary disease) ([facility])    QUIT SMOKING 30 YRS AGO  . Diabetes ([place]) 2018  . Dysrhythmia    PAF  . Empyema ([place])   . HTN (hypertension)   . Hyperlipidemia   . Neuromuscular disorder ([place])   . PONV (postoperative nausea and vomiting)   . Psoriatic arthritis ([place]) 2015  . Sepsis ([place])    possibly from right port and traveled to eye    ALLERGIES:  is allergic to pembrolizumab  and naproxen sodium.  MEDICATIONS:  Current Outpatient Medications  Medication Sig Dispense Refill  . acetaminophen (TYLENOL) 325 MG tablet Take 2 tablets (650 mg total) by mouth every 6 (six) hours as needed for mild pain, fever or headache. 120 tablet 0  . albuterol (VENTOLIN HFA) 108 (90 Base) MCG/ACT inhaler Inhale 2 puffs into the lungs every 6 (six) hours as needed for wheezing or shortness of breath.    . cyclobenzaprine (FLEXERIL) 10 MG tablet Take 10 mg by mouth at bedtime as needed for muscle spasms.    Marland Kitchen dexamethasone (DECADRON) 1 MG tablet 2 tabs twice daily for 7 days then one tabe twice daily for 7 days then one tablet daily for 7 days then stop    . diltiazem (CARDIZEM CD) 360 MG 24 hr capsule TAKE 1 CAPSULE BY MOUTH EVERY DAY 30 capsule 6  . docusate sodium (COLACE) 100 MG capsule Take 100 mg by mouth daily.    Marland Kitchen doxycycline (VIBRAMYCIN) 100 MG capsule Take 100 mg by mouth 2 (two) times daily.    . ferrous sulfate 325 (65 FE) MG tablet Take 325 mg by mouth daily with supper.    . folic acid (FOLVITE) 1 MG tablet Take 1 mg by mouth daily.    Marland Kitchen gabapentin (NEURONTIN) 300 MG capsule Take 600 mg by mouth at bedtime.    . metFORMIN (GLUCOPHAGE-XR) 500 MG 24 hr tablet Take  500-1,000 mg by mouth See admin instructions. Take 2 tablets (1000 mg) by mouth in the morning & 1 tablet (500 mg) by mouth at night.    . methotrexate (RHEUMATREX) 2.5 MG tablet Take 10 mg by mouth every Sunday. Caution:Chemotherapy. Protect from light.    . metoprolol tartrate (LOPRESSOR) 25 MG tablet Take 1 tablet (25 mg total) by mouth 2 (two) times daily. 180 tablet 3  . metroNIDAZOLE (METROCREAM) 0.75 % cream Apply 1 application topically 2 (two) times daily as needed (facial rosacea).     . mometasone (ELOCON) 0.1 % cream Apply 1 application topically 2 (two) times daily as needed (psoriasis).     . Multiple Vitamins-Minerals (CENTRUM ADULTS PO) Take 1 tablet by mouth daily at 6 (six) AM.    . Omega-3 Fatty  Acids (FISH OIL) 1000 MG CAPS Take 1,000 mg by mouth daily.     Marland Kitchen oxyCODONE-acetaminophen (PERCOCET/ROXICET) 5-325 MG tablet Take 1 tablet by mouth every 4 (four) hours as needed for severe pain.    . prednisoLONE acetate (PRED FORTE) 1 % ophthalmic suspension Place 1 drop into the left eye in the morning and at bedtime.    . prochlorperazine (COMPAZINE) 10 MG tablet Take 1 tablet (10 mg total) by mouth every 6 (six) hours as needed for nausea or vomiting. 30 tablet 0  . tamsulosin (FLOMAX) 0.4 MG CAPS capsule Take 0.4 mg by mouth daily.    . traMADol (ULTRAM) 50 MG tablet Take 50 mg by mouth every 4 (four) hours as needed for moderate pain. for pain    . triamcinolone lotion (KENALOG) 0.1 % Apply 1 application topically 4 (four) times daily. (Patient not taking: Reported on 08/24/2020) 120 mL 1   No current facility-administered medications for this visit.    SURGICAL HISTORY:  Past Surgical History:  Procedure Laterality Date  . BACK SURGERY  1990  . BIOPSY  12/30/2018   Procedure: BIOPSY;  Surgeon: Danie Binder, MD;  [facility];  Service: Endoscopy;;  gastric  . CATARACT EXTRACTION Bilateral   . CERVICAL SPINE SURGERY  1995  . COLONOSCOPY WITH PROPOFOL N/A 12/30/2018   Procedure: COLONOSCOPY WITH PROPOFOL;  Surgeon: Danie Binder, MD;  [facility];  Service: Endoscopy;  Laterality: N/A;  12:30pm  . ESOPHAGOGASTRODUODENOSCOPY (EGD) WITH PROPOFOL N/A 12/30/2018   Procedure: ESOPHAGOGASTRODUODENOSCOPY (EGD) WITH PROPOFOL;  Surgeon: Danie Binder, MD;  [facility];  Service: Endoscopy;  Laterality: N/A;  . EYE SURGERY     detached retina  . IR IMAGING GUIDED PORT INSERTION  09/04/2019  . LUMBAR LAMINECTOMY/ DECOMPRESSION WITH MET-RX Right 08/30/2020   Procedure: Right Lumbar three-four Microdiscectomy;  Surgeon: Kristeen Miss, MD;  Location: [facility];  Service: Neurosurgery;  Laterality: Right;  . POLYPECTOMY  12/30/2018   Procedure: POLYPECTOMY;  Surgeon:  Danie Binder, MD;  [facility];  Service: Endoscopy;;  colon  . THORACENTESIS Right 08/08/2019   Procedure: Thoracentesis;  Surgeon: [name], MD;  Location: [facility];  Service: Pulmonary;  Laterality: Right;  Marland Kitchen VIDEO BRONCHOSCOPY Right 08/08/2019   Procedure: Video Bronchoscopy with Erbe Cryo Biopsy of Right mainstem;  Surgeon: [name], MD;  Location: [facility] OR;  Service: Pulmonary;  Laterality: Right;    REVIEW OF SYSTEMS:  Constitutional: positive for fatigue Eyes: negative Ears, nose, mouth, throat, and face: negative Respiratory: positive for dyspnea on exertion Cardiovascular: negative Gastrointestinal: negative Genitourinary:negative Integument/breast: negative Hematologic/lymphatic: negative Musculoskeletal:positive for arthralgias Neurological: negative Behavioral/Psych:  negative Endocrine: negative Allergic/Immunologic: negative   PHYSICAL EXAMINATION: General appearance: alert, cooperative, fatigued and no distress Head: Normocephalic, without obvious abnormality, atraumatic Neck: no adenopathy, no JVD, supple, symmetrical, trachea midline and thyroid not enlarged, symmetric, no tenderness/mass/nodules Lymph nodes: Cervical, supraclavicular, and axillary nodes normal. Resp: clear to auscultation bilaterally Back: symmetric, no curvature. ROM normal. No CVA tenderness. Cardio: regular rate and rhythm, S1, S2 normal, no murmur, click, rub or gallop GI: soft, non-tender; bowel sounds normal; no masses,  no organomegaly Extremities: extremities normal, atraumatic, no cyanosis or edema Neurologic: Alert and oriented X 3, normal strength and tone. Normal symmetric reflexes. Normal coordination and gait  ECOG PERFORMANCE STATUS: 1 - Symptomatic but completely ambulatory  Blood pressure 114/65, pulse (!) 52, temperature 98.9 F (37.2 C), temperature source Tympanic, resp. rate 16, height '6\' 2"'  (1.88 m), weight 192 lb 6.4 oz (87.3 kg), SpO2 98 %.  LABORATORY  DATA: Lab Results  Component Value Date   WBC 16.1 (H) 10/04/2020   HGB 9.5 (L) 10/04/2020   HCT 29.2 (L) 10/04/2020   MCV 99.7 10/04/2020   PLT 171 10/04/2020      Chemistry      Component Value Date/Time   NA 137 10/04/2020 0946   K 4.7 10/04/2020 0946   CL 100 10/04/2020 0946   CO2 27 10/04/2020 0946   BUN 33 (H) 10/04/2020 0946   CREATININE 0.83 10/04/2020 0946      Component Value Date/Time   CALCIUM 9.2 10/04/2020 0946   ALKPHOS 62 10/04/2020 0946   AST 13 (L) 10/04/2020 0946   ALT 20 10/04/2020 0946   BILITOT 0.4 10/04/2020 0946       RADIOGRAPHIC STUDIES: CT Chest W Contrast  Result Date: 10/04/2020 CLINICAL DATA:  Non-small-cell lung cancer.  Restaging. EXAM: CT CHEST, ABDOMEN, AND PELVIS WITH CONTRAST TECHNIQUE: Multidetector CT imaging of the chest, abdomen and pelvis was performed following the standard protocol during bolus administration of intravenous contrast. CONTRAST:  189m OMNIPAQUE IOHEXOL 300 MG/ML  SOLN COMPARISON:  06/07/2020 FINDINGS: CT CHEST FINDINGS Cardiovascular: The heart size is normal. No substantial pericardial effusion. Coronary artery calcification is evident. Atherosclerotic calcification is noted in the wall of the thoracic aorta. Mediastinum/Nodes: No mediastinal lymphadenopathy. There is no hilar lymphadenopathy. The esophagus has normal imaging features. There is no axillary lymphadenopathy. Lungs/Pleura: 6.2 x 5.0 cm cavitary mass in the left lower lobe has increased from 1.4 x 1.3 cm previously. 7 mm left upper lobe pulmonary nodule is similar to prior. Volume loss right hemithorax compatible with right lower lobectomy bronchiectasis and scarring in the right lung is probably treatment related and similar to prior study. Confluent airspace disease at the right base seen previously has decreased in the interval. Stable right pleural effusion. Musculoskeletal: No worrisome lytic or sclerotic osseous abnormality. CT ABDOMEN PELVIS FINDINGS  Hepatobiliary: No suspicious focal abnormality within the liver parenchyma. There is no evidence for gallstones, gallbladder wall thickening, or pericholecystic fluid. No intrahepatic or extrahepatic biliary dilation. Pancreas: No focal mass lesion. No dilatation of the main duct. No intraparenchymal cyst. No peripancreatic edema. Spleen: No splenomegaly. No focal mass lesion. Adrenals/Urinary Tract: No adrenal nodule or mass. Stable subcapsular 10 mm low-density lesion lower pole left kidney, likely a cyst. Right kidney unremarkable No evidence for hydroureter. The urinary bladder appears normal for the degree of distention. Stomach/Bowel: Stomach is unremarkable. No gastric wall thickening. No evidence of outlet obstruction. Duodenum is normally positioned as is the ligament of Treitz. Duodenal diverticulum noted. No  small bowel wall thickening. No small bowel dilatation. The terminal ileum is normal. No gross colonic mass. No colonic wall thickening. Diverticular changes are noted in the left colon without evidence of diverticulitis. Vascular/Lymphatic: There is abdominal aortic atherosclerosis without aneurysm. There is no gastrohepatic or hepatoduodenal ligament lymphadenopathy. No retroperitoneal or mesenteric lymphadenopathy. No pelvic sidewall lymphadenopathy. Reproductive: The prostate gland and seminal vesicles are unremarkable. Other: No intraperitoneal free fluid. Musculoskeletal: No worrisome lytic or sclerotic osseous abnormality. IMPRESSION: 1. Interval marked increase in size of the cavitary left lower lobe pulmonary mass. This could represent metastatic progression, metachronous primary, or infectious/inflammatory etiology. Tissue sampling may be warranted. 2. Post treatment changes in the right lung with improved aeration at the right base. 3. Stable 7 mm left upper lobe pulmonary nodule. 4. No evidence for metastatic disease in the abdomen or pelvis. 5. Stable right pleural effusion. 6. Left  colonic diverticulosis without diverticulitis. 7. Aortic Atherosclerosis (ICD10-I70.0). Electronically Signed   By: Misty Stanley M.D.   On: 10/04/2020 16:22   CT Abdomen Pelvis W Contrast  Result Date: 10/04/2020 : CLINICAL DATA: Non-small-cell lung cancer. Restaging. EXAM: CT CHEST, ABDOMEN, AND PELVIS WITH CONTRAST TECHNIQUE: Multidetector CT imaging of the chest, abdomen and pelvis was performed following the standard protocol during bolus administration of intravenous contrast. CONTRAST: 148m OMNIPAQUE IOHEXOL 300 MG/ML SOLN COMPARISON: 06/07/2020 FINDINGS: CT CHEST FINDINGS Cardiovascular: The heart size is normal. No substantial pericardial effusion. Coronary artery calcification is evident. Atherosclerotic calcification is noted in the wall of the thoracic aorta. Mediastinum/Nodes: No mediastinal lymphadenopathy. There is no hilar lymphadenopathy. The esophagus has normal imaging features. There is no axillary lymphadenopathy. Lungs/Pleura: 6.2 x 5.0 cm cavitary mass in the left lower lobe has increased from 1.4 x 1.3 cm previously. 7 mm left upper lobe pulmonary nodule is similar to prior. Volume loss right hemithorax compatible with right lower lobectomy bronchiectasis and scarring in the right lung is probably treatment related and similar to prior study. Confluent airspace disease at the right base seen previously has decreased in the interval. Stable right pleural effusion. Musculoskeletal: No worrisome lytic or sclerotic osseous abnormality. CT ABDOMEN PELVIS FINDINGS Hepatobiliary: No suspicious focal abnormality within the liver parenchyma. There is no evidence for gallstones, gallbladder wall thickening, or pericholecystic fluid. No intrahepatic or extrahepatic biliary dilation. Pancreas: No focal mass lesion. No dilatation of the main duct. No intraparenchymal cyst. No peripancreatic edema. Spleen: No splenomegaly. No focal mass lesion. Adrenals/Urinary Tract: No adrenal nodule or mass. Stable  subcapsular 10 mm low-density lesion lower pole left kidney, likely a cyst. Right kidney unremarkable No evidence for hydroureter. The urinary bladder appears normal for the degree of distention. Stomach/Bowel: Stomach is unremarkable. No gastric wall thickening. No evidence of outlet obstruction. Duodenum is normally positioned as is the ligament of Treitz. Duodenal diverticulum noted. No small bowel wall thickening. No small bowel dilatation. The terminal ileum is normal. No gross colonic mass. No colonic wall thickening. Diverticular changes are noted in the left colon without evidence of diverticulitis. Vascular/Lymphatic: There is abdominal aortic atherosclerosis without aneurysm. There is no gastrohepatic or hepatoduodenal ligament lymphadenopathy. No retroperitoneal or mesenteric lymphadenopathy. No pelvic sidewall lymphadenopathy. Reproductive: The prostate gland and seminal vesicles are unremarkable. Other: No intraperitoneal free fluid. Musculoskeletal: No worrisome lytic or sclerotic osseous abnormality. IMPRESSION: 1. Interval marked increase in size of the cavitary left lower lobe pulmonary mass. This could represent metastatic progression, metachronous primary, or infectious/inflammatory etiology. Tissue sampling may be warranted. 2. Post treatment changes in the  right lung with improved aeration at the right base. 3. Stable 7 mm left upper lobe pulmonary nodule. 4. No evidence for metastatic disease in the abdomen or pelvis. 5. Stable right pleural effusion. 6. Left colonic diverticulosis without diverticulitis. 7. Aortic Atherosclerosis (ICD10-I70.0). Electronically Signed   By: Misty Stanley M.D.   On: 10/04/2020 16:30   NM PET Image Restage (PS) Skull Base to Thigh  Result Date: 10/18/2020 CLINICAL DATA:  Subsequent treatment strategy for non-small cell cancer. Initial diagnosis 08/08/2019. Status post chemotherapy. Evaluate new/enlarging left lower lobe lesion. EXAM: NUCLEAR MEDICINE PET SKULL  BASE TO THIGH TECHNIQUE: 9.8 mCi F-18 FDG was injected intravenously. Full-ring PET imaging was performed from the skull base to thigh after the radiotracer. CT data was obtained and used for attenuation correction and anatomic localization. Fasting blood glucose: 115 mg/dl COMPARISON:  Recent CT examination 10/04/2020 and prior PET-CT 09/08/2019 FINDINGS: Mediastinal blood pool activity: SUV max 2.23 Liver activity: SUV max NA NECK: No hypermetabolic lymph nodes in the neck. Incidental CT findings: none CHEST: The 5 cm cavitary lesion in the left lower lobe is hypermetabolic with SUV max of 59.16 and most consistent with a second primary lung neoplasm. Mildly hypermetabolic left hilar lymph nodes likely representing metastatic adenopathy. SUV max is 4.16. Extensive post treatment/radiation changes involving the right hemithorax with significant loss of volume, airspace consolidation and bronchiectasis. There is also a loculated pleural effusion with surrounding thickened pleura. Diffuse low level hypermetabolism throughout the right this likely post treatment change. SUV max is 4.51. Areas of recurrent tumor could not be excluded. No other pulmonary lesions are identified. Scattered small mediastinal and hilar lymph nodes without hypermetabolism. Incidental CT findings: Stable underlying emphysematous changes and areas of pulmonary scarring. Stable atherosclerotic calcifications involving the aorta and coronary arteries. ABDOMEN/PELVIS: No PET-CT findings suspicious for abdominal/pelvic metastatic disease. No hepatic or adrenal gland lesions are identified. Incidental CT findings: Stable atherosclerotic calcifications involving the aorta, iliac arteries and branch vessels. Stable advanced sigmoid colon diverticulosis. SKELETON: No findings suspicious for osseous metastatic disease. Incidental CT findings: none IMPRESSION: 1. 5 cm cavitary mass in the left lower lobe is hypermetabolic and consistent with a second  primary lung neoplasm, also likely squamous cell carcinoma. 2. Mildly hypermetabolic left hilar nodes suspicious for metastatic adenopathy. 3. Stable appearing post treatment changes involving the right hemithorax as detailed above. Low level hypermetabolism likely treatment effect but could not exclude residual neoplasm. 4. No findings suspicious for abdominal/pelvic metastatic disease or osseous metastatic disease. Electronically Signed   By: Marijo Sanes M.D.   On: 10/18/2020 09:52    ASSESSMENT AND PLAN: This is a very pleasant 75 years old white male with stage IIIc/IV (T4, N2, M0/M1 a) non-small cell lung cancer, squamous cell carcinoma presented with large necrotic mass extending to the carina with associated obstruction of the right lower lobe bronchus and postobstructive collapse/consolidation and necrosis in the right lower lobe as well as mediastinal lymphadenopathy with 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   The patient has PD-L1 expression of 20%. The patient completed a course of palliative radiotherapy to the obstructive right lower lobe lung mass under the care of Dr. Sondra Come.  He is feeling a little bit better with less shortness of breath and chest pain. The patient is currently undergoing systemic chemotherapy with carboplatin and paclitaxel.  Status post 6 cycles.  His treatment with [name] was discontinued after cycle #1 secondary to significant skin rash.  He was treated with a  tapered dose of prednisone. He continues his treatment with carboplatin and paclitaxel for a total of 6 cycles.  The patient is currently on observation and he has been doing fine with no concerning complaints except for mild shortness of breath and arthralgia. The patient was found recently to have enlarging left lower lobe lung mass suspicious for synchronous malignancy.  He had a PET scan performed recently in the the PET scan showed significant hypermetabolic activity in the  mass suspicious for new lung cancer in the left lung. I recommended for the patient to have CT-guided core biopsy of this mass for confirmation of the tissue diagnosis.  I will see him back for follow-up visit in 2 weeks for evaluation and discussion of his treatment options based on the final pathology. The patient was advised to call immediately if he has any other concerning symptoms in the interval. The patient voices understanding of current disease status and treatment options and is in agreement with the current care plan.  All questions were answered. The patient knows to call the clinic with any problems, questions or concerns. We can certainly see the patient much sooner if necessary.  Disclaimer: This note was dictated with voice recognition software. Similar sounding words can inadvertently be transcribed and may not be corrected upon review.

## 2020-10-19 NOTE — Progress Notes (Unsigned)
            Jeremy Gee. Borski Male, 75 y.o., 09/02/1945  MRN:  [id number] Phone:  [phone] [name])       PCP:  [name], MD Primary Cvg:  Medicare/Medicare Part A And B  Next Appt With Radiology ([facility]-CT 3) 10/21/2020 at 11:00 AM           RE: Biopsy Received: Today  Message Details  Arne Cleveland, MD  [facility] cavitary mass no distant mets on PET   [facility] for CT LLL lung mass core biopsy   [facility]    Previous Messages  ----- Message -----  From: Lenore Cordia  Sent: 10/19/2020 11:18 AM EDT  To: [facility] Procedure Requests  Subject: Biopsy                      Procedure Requested: CT-guided core biopsy    Reason for Procedure:  of the left lower lobe lung mass.   Provider Requesting: Curt Bears  Provider Telephone: [phone]    Other Info:

## 2020-10-20 ENCOUNTER — Telehealth: Payer: Self-pay | Admitting: Internal Medicine

## 2020-10-20 ENCOUNTER — Other Ambulatory Visit: Payer: Self-pay | Admitting: Radiology

## 2020-10-20 ENCOUNTER — Other Ambulatory Visit: Payer: Self-pay | Admitting: Student

## 2020-10-20 NOTE — Telephone Encounter (Signed)
Pt's wife called in to schedule appts. Scheduled f/u appt per 5/17 los. Pt's wife is aware.

## 2020-10-21 ENCOUNTER — Other Ambulatory Visit: Payer: Self-pay

## 2020-10-21 ENCOUNTER — Emergency Department (HOSPITAL_COMMUNITY): Payer: Medicare Other

## 2020-10-21 ENCOUNTER — Ambulatory Visit (HOSPITAL_COMMUNITY)
Admission: RE | Admit: 2020-10-21 | Discharge: 2020-10-21 | Disposition: A | Discharge status: Home / Self Care | Payer: Medicare Other | Source: Ambulatory Visit | Attending: Internal Medicine | Admitting: Internal Medicine

## 2020-10-21 ENCOUNTER — Observation Stay (HOSPITAL_COMMUNITY)
Admission: EM | Admit: 2020-10-21 | Discharge: 2020-10-22 | Disposition: A | Discharge status: Home / Self Care | Payer: Medicare Other | Attending: Internal Medicine | Admitting: Internal Medicine

## 2020-10-21 ENCOUNTER — Encounter (HOSPITAL_COMMUNITY): Payer: Self-pay | Admitting: Internal Medicine

## 2020-10-21 DIAGNOSIS — R6510 Systemic inflammatory response syndrome (SIRS) of non-infectious origin without acute organ dysfunction: Secondary | ICD-10-CM | POA: Diagnosis not present

## 2020-10-21 DIAGNOSIS — E118 Type 2 diabetes mellitus with unspecified complications: Secondary | ICD-10-CM | POA: Insufficient documentation

## 2020-10-21 DIAGNOSIS — A419 Sepsis, unspecified organism: Secondary | ICD-10-CM | POA: Diagnosis not present

## 2020-10-21 DIAGNOSIS — Z85118 Personal history of other malignant neoplasm of bronchus and lung: Secondary | ICD-10-CM | POA: Insufficient documentation

## 2020-10-21 DIAGNOSIS — D630 Anemia in neoplastic disease: Secondary | ICD-10-CM | POA: Diagnosis present

## 2020-10-21 DIAGNOSIS — I480 Paroxysmal atrial fibrillation: Secondary | ICD-10-CM | POA: Diagnosis present

## 2020-10-21 DIAGNOSIS — R059 Cough, unspecified: Secondary | ICD-10-CM | POA: Insufficient documentation

## 2020-10-21 DIAGNOSIS — J90 Pleural effusion, not elsewhere classified: Secondary | ICD-10-CM | POA: Insufficient documentation

## 2020-10-21 DIAGNOSIS — Z7984 Long term (current) use of oral hypoglycemic drugs: Secondary | ICD-10-CM | POA: Insufficient documentation

## 2020-10-21 DIAGNOSIS — E119 Type 2 diabetes mellitus without complications: Secondary | ICD-10-CM | POA: Diagnosis not present

## 2020-10-21 DIAGNOSIS — J449 Chronic obstructive pulmonary disease, unspecified: Secondary | ICD-10-CM | POA: Diagnosis present

## 2020-10-21 DIAGNOSIS — Z7901 Long term (current) use of anticoagulants: Secondary | ICD-10-CM | POA: Insufficient documentation

## 2020-10-21 DIAGNOSIS — C3491 Malignant neoplasm of unspecified part of right bronchus or lung: Secondary | ICD-10-CM

## 2020-10-21 DIAGNOSIS — C3432 Malignant neoplasm of lower lobe, left bronchus or lung: Secondary | ICD-10-CM | POA: Diagnosis not present

## 2020-10-21 DIAGNOSIS — R079 Chest pain, unspecified: Secondary | ICD-10-CM

## 2020-10-21 DIAGNOSIS — I10 Essential (primary) hypertension: Secondary | ICD-10-CM | POA: Insufficient documentation

## 2020-10-21 DIAGNOSIS — Z79899 Other long term (current) drug therapy: Secondary | ICD-10-CM | POA: Insufficient documentation

## 2020-10-21 DIAGNOSIS — R0600 Dyspnea, unspecified: Secondary | ICD-10-CM | POA: Insufficient documentation

## 2020-10-21 DIAGNOSIS — Z20822 Contact with and (suspected) exposure to covid-19: Secondary | ICD-10-CM | POA: Diagnosis not present

## 2020-10-21 DIAGNOSIS — R509 Fever, unspecified: Secondary | ICD-10-CM | POA: Diagnosis not present

## 2020-10-21 DIAGNOSIS — R918 Other nonspecific abnormal finding of lung field: Secondary | ICD-10-CM | POA: Diagnosis not present

## 2020-10-21 DIAGNOSIS — J95811 Postprocedural pneumothorax: Secondary | ICD-10-CM

## 2020-10-21 DIAGNOSIS — Z87891 Personal history of nicotine dependence: Secondary | ICD-10-CM | POA: Insufficient documentation

## 2020-10-21 LAB — CBC WITH DIFFERENTIAL/PLATELET
Abs Immature Granulocytes: 0.1 10*3/uL — ABNORMAL HIGH (ref 0.00–0.07)
Abs Immature Granulocytes: 0.06 10*3/uL (ref 0.00–0.07)
Basophils Absolute: 0 10*3/uL (ref 0.0–0.1)
Basophils Absolute: 0 10*3/uL (ref 0.0–0.1)
Basophils Relative: 0 %
Basophils Relative: 0 %
Eosinophils Absolute: 0.4 10*3/uL (ref 0.0–0.5)
Eosinophils Absolute: 0.2 10*3/uL (ref 0.0–0.5)
Eosinophils Relative: 4 %
Eosinophils Relative: 2 %
HCT: 29.1 % — ABNORMAL LOW (ref 39.0–52.0)
HCT: 27.7 % — ABNORMAL LOW (ref 39.0–52.0)
Hemoglobin: 8.9 g/dL — ABNORMAL LOW (ref 13.0–17.0)
Hemoglobin: 8.8 g/dL — ABNORMAL LOW (ref 13.0–17.0)
Immature Granulocytes: 1 %
Immature Granulocytes: 1 %
Lymphocytes Relative: 7 %
Lymphocytes Relative: 5 %
Lymphs Abs: 0.7 10*3/uL (ref 0.7–4.0)
Lymphs Abs: 0.5 10*3/uL — ABNORMAL LOW (ref 0.7–4.0)
MCH: 31.9 pg (ref 26.0–34.0)
MCH: 32.5 pg (ref 26.0–34.0)
MCHC: 30.6 g/dL (ref 30.0–36.0)
MCHC: 31.8 g/dL (ref 30.0–36.0)
MCV: 104.3 fL — ABNORMAL HIGH (ref 80.0–100.0)
MCV: 102.2 fL — ABNORMAL HIGH (ref 80.0–100.0)
Monocytes Absolute: 0.5 10*3/uL (ref 0.1–1.0)
Monocytes Absolute: 0.5 10*3/uL (ref 0.1–1.0)
Monocytes Relative: 6 %
Monocytes Relative: 5 %
Neutro Abs: 7.7 10*3/uL (ref 1.7–7.7)
Neutro Abs: 8.3 10*3/uL — ABNORMAL HIGH (ref 1.7–7.7)
Neutrophils Relative %: 82 %
Neutrophils Relative %: 87 %
Platelets: 186 10*3/uL (ref 150–400)
Platelets: 172 10*3/uL (ref 150–400)
RBC: 2.79 MIL/uL — ABNORMAL LOW (ref 4.22–5.81)
RBC: 2.71 MIL/uL — ABNORMAL LOW (ref 4.22–5.81)
RDW: 16.7 % — ABNORMAL HIGH (ref 11.5–15.5)
RDW: 16.7 % — ABNORMAL HIGH (ref 11.5–15.5)
WBC: 9.4 10*3/uL (ref 4.0–10.5)
WBC: 9.6 10*3/uL (ref 4.0–10.5)
nRBC: 0 % (ref 0.0–0.2)
nRBC: 0 % (ref 0.0–0.2)

## 2020-10-21 LAB — COMPREHENSIVE METABOLIC PANEL
ALT: 20 U/L (ref 0–44)
AST: 12 U/L — ABNORMAL LOW (ref 15–41)
Albumin: 2.6 g/dL — ABNORMAL LOW (ref 3.5–5.0)
Alkaline Phosphatase: 49 U/L (ref 38–126)
Anion gap: 8 (ref 5–15)
BUN: 16 mg/dL (ref 8–23)
CO2: 25 mmol/L (ref 22–32)
Calcium: 8.6 mg/dL — ABNORMAL LOW (ref 8.9–10.3)
Chloride: 103 mmol/L (ref 98–111)
Creatinine, Ser: 0.95 mg/dL (ref 0.61–1.24)
GFR, Estimated: >60 mL/min (ref >60)
Glucose, Bld: 145 mg/dL — ABNORMAL HIGH (ref 70–99)
Potassium: 4.1 mmol/L (ref 3.5–5.1)
Sodium: 136 mmol/L (ref 135–145)
Total Bilirubin: 0.9 mg/dL (ref 0.3–1.2)
Total Protein: 6.8 g/dL (ref 6.5–8.1)

## 2020-10-21 LAB — URINALYSIS, ROUTINE W REFLEX MICROSCOPIC
Bilirubin Urine: NEGATIVE
Glucose, UA: NEGATIVE mg/dL
Ketones, ur: 20 mg/dL — AB
Leukocytes,Ua: NEGATIVE
Nitrite: NEGATIVE
Protein, ur: 30 mg/dL — AB
Specific Gravity, Urine: 1.014 (ref 1.005–1.030)
pH: 5 (ref 5.0–8.0)

## 2020-10-21 LAB — GLUCOSE, CAPILLARY
Glucose-Capillary: 144 mg/dL — ABNORMAL HIGH (ref 70–99)
Glucose-Capillary: 110 mg/dL — ABNORMAL HIGH (ref 70–99)

## 2020-10-21 LAB — PROTIME-INR
INR: 1 (ref 0.8–1.2)
INR: 1.1 (ref 0.8–1.2)
Prothrombin Time: 13.7 seconds (ref 11.4–15.2)
Prothrombin Time: 13.9 seconds (ref 11.4–15.2)

## 2020-10-21 LAB — LACTIC ACID, PLASMA
Lactic Acid, Venous: 1 mmol/L (ref 0.5–1.9)
Lactic Acid, Venous: 0.7 mmol/L (ref 0.5–1.9)

## 2020-10-21 LAB — BASIC METABOLIC PANEL
Anion gap: 7 (ref 5–15)
BUN: 19 mg/dL (ref 8–23)
CO2: 28 mmol/L (ref 22–32)
Calcium: 8.6 mg/dL — ABNORMAL LOW (ref 8.9–10.3)
Chloride: 102 mmol/L (ref 98–111)
Creatinine, Ser: 1.04 mg/dL (ref 0.61–1.24)
GFR, Estimated: >60 mL/min (ref >60)
Glucose, Bld: 148 mg/dL — ABNORMAL HIGH (ref 70–99)
Potassium: 4.2 mmol/L (ref 3.5–5.1)
Sodium: 137 mmol/L (ref 135–145)

## 2020-10-21 LAB — RESP PANEL BY RT-PCR (FLU A&B, COVID) ARPGX2
Influenza A by PCR: NEGATIVE
Influenza B by PCR: NEGATIVE
SARS Coronavirus 2 by RT PCR: NEGATIVE

## 2020-10-21 MED ORDER — FENTANYL CITRATE (PF) 100 MCG/2ML IJ SOLN
INTRAMUSCULAR | Status: AC
  Filled 2020-10-21: qty 2

## 2020-10-21 MED ORDER — SODIUM CHLORIDE 0.9 % IV SOLN
500.0000 mg | INTRAVENOUS | Status: DC
  Administered 2020-10-21: 500 mg via INTRAVENOUS
  Filled 2020-10-21 (×2): qty 500

## 2020-10-21 MED ORDER — SODIUM CHLORIDE 0.9 % IV SOLN: INTRAVENOUS | Status: DC

## 2020-10-21 MED ORDER — SODIUM CHLORIDE 0.9 % IV SOLN
2.0000 g | INTRAVENOUS | Status: DC
  Administered 2020-10-21: 2 g via INTRAVENOUS
  Filled 2020-10-21 (×2): qty 20

## 2020-10-21 MED ORDER — MIDAZOLAM HCL 2 MG/2ML IJ SOLN
INTRAMUSCULAR | Status: AC | PRN
  Administered 2020-10-21 (×2): 0.5 mg via INTRAVENOUS
  Administered 2020-10-21: 1 mg via INTRAVENOUS

## 2020-10-21 MED ORDER — INSULIN ASPART 100 UNIT/ML IJ SOLN
0.0000 [IU] | Freq: Three times a day (TID) | INTRAMUSCULAR | Status: DC
  Administered 2020-10-22: 1 [IU] via SUBCUTANEOUS

## 2020-10-21 MED ORDER — FENTANYL CITRATE (PF) 100 MCG/2ML IJ SOLN
25.0000 ug | Freq: Once | INTRAMUSCULAR | Status: AC
  Administered 2020-10-21: 25 ug via INTRAVENOUS

## 2020-10-21 MED ORDER — SODIUM CHLORIDE 0.9 % IV BOLUS
30.0000 mL/kg | Freq: Once | INTRAVENOUS | Status: AC
  Administered 2020-10-21: 2571 mL via INTRAVENOUS

## 2020-10-21 MED ORDER — ONDANSETRON HCL 4 MG/2ML IJ SOLN
4.0000 mg | Freq: Once | INTRAMUSCULAR | Status: AC
  Administered 2020-10-21: 4 mg via INTRAVENOUS
  Filled 2020-10-21: qty 2

## 2020-10-21 MED ORDER — MIDAZOLAM HCL 2 MG/2ML IJ SOLN
INTRAMUSCULAR | Status: AC
  Filled 2020-10-21: qty 2

## 2020-10-21 MED ORDER — ACETAMINOPHEN 500 MG PO TABS
1000.0000 mg | ORAL_TABLET | Freq: Once | ORAL | Status: AC
  Administered 2020-10-21: 1000 mg via ORAL
  Filled 2020-10-21: qty 2

## 2020-10-21 MED ORDER — SODIUM CHLORIDE 0.9 % IV SOLN
INTRAVENOUS | Status: AC | PRN
  Administered 2020-10-21: 10 mL/h via INTRAVENOUS

## 2020-10-21 MED ORDER — FENTANYL CITRATE (PF) 100 MCG/2ML IJ SOLN
INTRAMUSCULAR | Status: AC | PRN
  Administered 2020-10-21: 25 ug via INTRAVENOUS
  Administered 2020-10-21: 50 ug via INTRAVENOUS
  Administered 2020-10-21: 25 ug via INTRAVENOUS

## 2020-10-21 MED ORDER — FENTANYL CITRATE (PF) 100 MCG/2ML IJ SOLN
100.0000 ug | Freq: Once | INTRAMUSCULAR | Status: AC
Start: 2020-10-21 — End: 2020-10-21
  Administered 2020-10-21: 100 ug via INTRAVENOUS
  Filled 2020-10-21: qty 2

## 2020-10-21 MED ORDER — LIDOCAINE HCL 1 % IJ SOLN
INTRAMUSCULAR | Status: AC
  Filled 2020-10-21: qty 20

## 2020-10-21 MED ORDER — HYDROMORPHONE HCL 1 MG/ML IJ SOLN
1.0000 mg | Freq: Once | INTRAMUSCULAR | Status: AC
  Administered 2020-10-21: 1 mg via INTRAVENOUS
  Filled 2020-10-21: qty 1

## 2020-10-21 NOTE — ED Notes (Signed)
Received verbal report from [place] at this time

## 2020-10-21 NOTE — Discharge Instructions (Signed)
Lung Biopsy, Care After  This information will help you take care of yourself after your procedure. Your health care provider may also give you more specific instructions depending on the type of biopsy you had. If you have problems or questions, contact your health care provider.  What can I expect after the procedure?  After the procedure, it is common to have:   A cough.   A sore throat.   Pain where a needle or incision was used to collect a biopsy sample (biopsy site).  Follow these instructions at home:  Medicines   Take over-the-counter and prescription medicines only as told by your health care provider.   Talk to your health care provider before you take any medicines that contain aspirin or NSAIDS, such as ibuprofen. These medicines can increase your risk of bleeding.   Ask your health care provider if the medicine prescribed to you:  ? Requires you to avoid driving or using machinery.  ? Can cause constipation. You may need to take these actions to prevent or treat constipation:   Drink enough fluid to keep your urine pale yellow.   Take over-the-counter or prescription medicines.   Eat foods that are high in fiber, such as beans, whole grains, fresh fruits and vegetables.   Limit foods that are high in fat and processed sugars, such as fried or sweet foods.  Biopsy site care     If you had a needle or open biopsy, follow instructions from your health care provider about how to take care of your biopsy site. Make sure you:  ? Wash your hands with soap and water for at least 20 seconds before and after you change your bandage (dressing). If soap and water are not available, use hand sanitizer.  ? Change your dressing as told by your health care provider.  ? Leave stitches (sutures), skin glue, or adhesive strips in place for as long as you are told. If adhesive strip edges start to loosen and curl up, you may trim the loose edges. Do not remove adhesive strips completely unless your health care  provider tells you to do that.   Do not take baths, swim, or use a hot tub until your health care provider approves. Ask your health care provider if you may take showers. You may only be allowed to take sponge baths.   Check your biopsy site every day for signs of infection. Check for:  ? Redness, swelling, or more pain.  ? Fluid or blood.  ? Warmth.  ? Pus or a bad smell.  General instructions   Do not drink alcohol if your health care provider tells you not to drink.   If you were given a sedative during the procedure, it can affect you for several hours. Do not drive or operate machinery until your health care provider says that it is safe.   Return to your normal activities as told by your health care provider. Ask your health care provider what activities are safe for you.   It is up to you to get the results of your procedure. Ask your health care provider, or the department that is doing the procedure, when your results will be ready.   Keep all follow-up visits as told by your health care provider. This is important.  Contact a health care provider if:   You have a fever.   You have redness, swelling, or more pain around your biopsy site.   You have fluid or blood   coming from your biopsy site.   Your biopsy site feels warm to the touch.   You have pus or a bad smell coming from your biopsy site.   You have pain that does not get better with medicine.  Get help right away if:   You cough up blood.   You have trouble breathing.   You have chest pain.   You lose consciousness.  These symptoms may represent a serious problem that is an emergency. Do not wait to see if the symptoms will go away. Get medical help right away. Call your local emergency services (911 in the [place] Do not drive yourself to the hospital.  Summary   It is common to have some pain where a needle or incision was used to collect a biopsy sample (biopsy site).   Return to your normal activities as told by your health  care provider. Ask your health care provider what activities are safe for you.   Take over-the-counter and prescription medicines only as told by your health care provider.   Report any unusual symptoms to your health care provider.  This information is not intended to replace advice given to you by your health care provider. Make sure you discuss any questions you have with your health care provider.  Document Revised: 05/03/2019 Document Reviewed: 05/03/2019  Elsevier Patient Education  2021 Elsevier Inc.

## 2020-10-21 NOTE — ED Triage Notes (Signed)
Pt came by short stay. Pt got a lung biopsy done. After pt woke up, pt presented septic. T-102. HR 140. R-27. Pt is very confused. Wife at bedside.

## 2020-10-21 NOTE — Progress Notes (Signed)
Patient was transported to ED with wife at bedside per order of Radiology MD Dr. Earleen Newport. Reported off to ED nurse- patient with fever now, hot, Hr 130s, patient has frequency and is aggitated. ED nurse in room with patient.

## 2020-10-21 NOTE — H&P (Addendum)
History and Physical    Jeremy [name] KKX:[id number] DOB: 1945/06/19 DOA: 10/21/2020  PCP: [name], MD  Patient coming from: Home.  Chief Complaint: Fever confusion.  HPI: Jeremy [name] is a 75 y.o. male with history of paroxysmal atrial fibrillation not on anticoagulation secondary to transfusion dependent anemia with history of lung cancer being followed by Dr. Julien Nordmann had a lung biopsy today following which patient became acutely confused short of breath tachycardic and febrile.  Patient was transferred to the ER.  Patient recently had in March decompression of L3 and L4 spine.  ED Course: After reaching ER patient was back to baseline.  Patient's fever resolved tachycardia improved and chest x-ray does not show anything acute.  Lab works are at baseline.  Lactic acid also normal.  Given the SIRS symptoms patient admitted for further observation.  COVID test was negative.  Blood cultures obtained and since patient had lung biopsy patient was empirically treated for lung infection.  Review of Systems: As per HPI, rest all negative.   Past Medical History:  Diagnosis Date  . Anemia   . Cancer ([place])    mass right lung  . COPD (chronic obstructive pulmonary disease) (HCC)    QUIT SMOKING 30 YRS AGO  . Diabetes ([place]) 2018  . Dysrhythmia    PAF  . Empyema ([place])   . HTN (hypertension)   . Hyperlipidemia   . Neuromuscular disorder ([place])   . PONV (postoperative nausea and vomiting)   . 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 Sepsis ([place])    possibly from right port and traveled to eye    Past Surgical History:  Procedure Laterality Date  . BACK SURGERY  1990  . BIOPSY  12/30/2018   Procedure: BIOPSY;  Surgeon: Danie Binder, MD;  [facility];  Service: Endoscopy;;  gastric  . CATARACT EXTRACTION Bilateral   . CERVICAL SPINE SURGERY  1995  . COLONOSCOPY WITH PROPOFOL N/A 12/30/2018   Procedure: COLONOSCOPY WITH PROPOFOL;  Surgeon: Danie Binder, MD;  [facility];  Service: Endoscopy;  Laterality: N/A;  12:30pm  . ESOPHAGOGASTRODUODENOSCOPY (EGD) WITH PROPOFOL N/A 12/30/2018   Procedure: ESOPHAGOGASTRODUODENOSCOPY (EGD) WITH PROPOFOL;  Surgeon: Danie Binder, MD;  [facility];  Service: Endoscopy;  Laterality: N/A;  . EYE SURGERY     detached retina  . IR IMAGING GUIDED PORT INSERTION  09/04/2019  . LUMBAR LAMINECTOMY/ DECOMPRESSION WITH MET-RX Right 08/30/2020   Procedure: Right Lumbar three-four Microdiscectomy;  Surgeon: Kristeen Miss, MD;  Location: [facility];  Service: Neurosurgery;  Laterality: Right;  . POLYPECTOMY  12/30/2018   Procedure: POLYPECTOMY;  Surgeon: Danie Binder, MD;  [facility];  Service: Endoscopy;;  colon  . THORACENTESIS Right 08/08/2019   Procedure: Thoracentesis;  Surgeon: [name], MD;  Location: [facility];  Service: Pulmonary;  Laterality: Right;  Marland Kitchen VIDEO BRONCHOSCOPY Right 08/08/2019   Procedure: Video Bronchoscopy with Erbe Cryo Biopsy of Right mainstem;  Surgeon: [name], MD;  Location: [facility] OR;  Service: Pulmonary;  Laterality: Right;     reports that he quit smoking about 28 years ago. His smoking use included cigarettes. He has a 25.00 pack-year smoking history. He has never used smokeless tobacco. He reports current alcohol use. He reports that he does not use drugs.  Allergies  Allergen Reactions  . Naproxen Sodium Hives, Swelling, Rash and Other (See Comments)    "Made the face, lips, and mouth  swell- had to go to the E.R."  . Pembrolizumab Rash and Other (See Comments)    KEYTRUDA  . Nsaids Hives, Swelling and Rash    Family History  Problem Relation Age of Onset  . Alcoholism Father   . CAD Brother   . Diabetes Brother   . Colon cancer Neg Hx   . Colon polyps Neg Hx   . Gastric cancer Neg Hx     Prior to Admission medications   Medication Sig Start Date End Date Taking? Authorizing Provider  acetaminophen (TYLENOL) 325 MG tablet Take 2 tablets (650 mg total) by  mouth every 6 (six) hours as needed for mild pain, fever or headache. Patient taking differently: Take 325 mg by mouth every 4 (four) hours as needed for mild pain, fever or headache. 10/28/19  Yes Emokpae, Courage, MD  albuterol (VENTOLIN HFA) 108 (90 Base) MCG/ACT inhaler Inhale 2 puffs into the lungs See admin instructions. Inhale 2 puffs into the lungs in the morning, then every 6 hours as needed for shortness of breath or wheezing   Yes [provider]  cyclobenzaprine (FLEXERIL) 10 MG tablet Take 10 mg by mouth at bedtime as needed for muscle spasms. 11/23/19  Yes [provider]  diltiazem (CARDIZEM CD) 360 MG 24 hr capsule TAKE 1 CAPSULE BY MOUTH EVERY DAY Patient taking differently: Take 360 mg by mouth daily. 09/23/20  Yes BranchAlphonse Guild, MD  docusate sodium (COLACE) 100 MG capsule Take 100 mg by mouth at bedtime.   Yes [provider]  ferrous sulfate 325 (65 FE) MG tablet Take 325 mg by mouth daily with supper.   Yes [provider]  folic acid (FOLVITE) 1 MG tablet Take 1 mg by mouth every evening.   Yes [provider]  gabapentin (NEURONTIN) 300 MG capsule Take 600 mg by mouth at bedtime as needed (for neuropathy).   Yes [provider]  meclizine (ANTIVERT) 25 MG tablet Take 25 mg by mouth 3 (three) times daily as needed for dizziness.   Yes [provider]  metFORMIN (GLUCOPHAGE-XR) 500 MG 24 hr tablet Take 500-1,000 mg by mouth See admin instructions. Take 1,000 mg by mouth in the morning and 500 mg at bedtime 10/15/18  Yes [provider]  methotrexate (RHEUMATREX) 2.5 MG tablet Take 10 mg by mouth every Sunday. Caution:Chemotherapy. Protect from light.   Yes [provider]  metoprolol tartrate (LOPRESSOR) 25 MG tablet Take 1 tablet (25 mg total) by mouth 2 (two) times daily. 07/23/20  Yes Branch, Alphonse Guild, MD  metroNIDAZOLE (METROCREAM) 0.75 % cream Apply 1 application topically 2 (two) times daily as  needed (facial rosacea).    Yes [provider]  mometasone (ELOCON) 0.1 % cream Apply 1 application topically 2 (two) times daily as needed (psoriasis).    Yes [provider]  Multiple Vitamins-Minerals (CENTRUM ADULTS PO) Take 1 tablet by mouth daily at 6 (six) AM.   Yes [provider]  Omega-3 Fatty Acids (FISH OIL) 1000 MG CAPS Take 1,000 mg by mouth daily.    Yes [provider]  oxyCODONE-acetaminophen (PERCOCET/ROXICET) 5-325 MG tablet Take 1 tablet by mouth every 4 (four) hours as needed for severe pain.   Yes [provider]  prednisoLONE acetate (PRED FORTE) 1 % ophthalmic suspension Place 1 drop into the left eye in the morning and at bedtime.   Yes [provider]  tamsulosin (FLOMAX) 0.4 MG CAPS capsule Take 0.4 mg by mouth daily.  03/16/20  Yes [provider]  traMADol (ULTRAM) 50 MG tablet Take 50 mg by mouth every 4 (four) hours as needed for moderate pain. 08/19/18  Yes [provider]    Physical Exam: Constitutional: Moderately built and nourished. Vitals:   10/21/20 1930 10/21/20 2000 10/21/20 2030 10/21/20 2100  BP: 125/79 126/84 111/73 123/72  Pulse: (!) 107 (!) 107 (!) 107 (!) 102  Resp:  20  (!) 26  Temp:    98.9 F (37.2 C)  TempSrc:    Oral  SpO2: 97% 99% 96% 98%  Weight:      Height:       Eyes: Anicteric no pallor. ENMT: No discharge from the ears eyes nose and mouth. Neck: No mass felt.  No neck rigidity. Respiratory: No rhonchi or crepitations. Cardiovascular: S1-S2 heard. Abdomen: Soft nontender bowel sounds present. Musculoskeletal: No edema. Skin: No rash. Neurologic: Alert awake oriented to time place and person.  Moves all extremities. Psychiatric: Appears normal.  Normal affect.   Labs on Admission: I have personally reviewed following labs and imaging studies  CBC: Recent Labs  Lab 10/21/20 0918 10/21/20 1746  WBC 9.4 9.6  NEUTROABS 7.7 8.3*  HGB 8.9* 8.8*  HCT  29.1* 27.7*  MCV 104.3* 102.2*  PLT 186 768   Basic Metabolic Panel: Recent Labs  Lab 10/21/20 0918 10/21/20 1746  NA 137 136  K 4.2 4.1  CL 102 103  CO2 28 25  GLUCOSE 148* 145*  BUN 19 16  CREATININE 1.04 0.95  CALCIUM 8.6* 8.6*   GFR: Estimated Creatinine Clearance: 76.9 mL/min (by C-G formula based on SCr of 0.95 mg/dL). Liver Function Tests: Recent Labs  Lab 10/21/20 1746  AST 12*  ALT 20  ALKPHOS 49  BILITOT 0.9  PROT 6.8  ALBUMIN 2.6*   No results for input(s): LIPASE, AMYLASE in the last 168 hours. No results for input(s): AMMONIA in the last 168 hours. Coagulation Profile: Recent Labs  Lab 10/21/20 0918 10/21/20 1746  INR 1.0 1.1   Cardiac Enzymes: No results for input(s): CKTOTAL, CKMB, CKMBINDEX, TROPONINI in the last 168 hours. BNP (last 3 results) No results for input(s): PROBNP in the last 8760 hours. HbA1C: No results for input(s): HGBA1C in the last 72 hours. CBG: Recent Labs  Lab 10/15/20 1431 10/21/20 0909 10/21/20 1409  GLUCAP 115* 144* 110*   Lipid Profile: No results for input(s): CHOL, HDL, LDLCALC, TRIG, CHOLHDL, LDLDIRECT in the last 72 hours. Thyroid Function Tests: No results for input(s): TSH, T4TOTAL, FREET4, T3FREE, THYROIDAB in the last 72 hours. Anemia Panel: No results for input(s): VITAMINB12, FOLATE, FERRITIN, TIBC, IRON, RETICCTPCT in the last 72 hours. Urine analysis:    Component Value Date/Time   COLORURINE YELLOW 10/21/2020 1850   APPEARANCEUR CLEAR 10/21/2020 1850   LABSPEC 1.014 10/21/2020 1850   PHURINE 5.0 10/21/2020 1850   GLUCOSEU NEGATIVE 10/21/2020 1850   HGBUR SMALL (A) 10/21/2020 1850   BILIRUBINUR NEGATIVE 10/21/2020 1850   KETONESUR 20 (A) 10/21/2020 1850   PROTEINUR 30 (A) 10/21/2020 1850   NITRITE NEGATIVE 10/21/2020 1850   LEUKOCYTESUR NEGATIVE 10/21/2020 1850   Sepsis Labs: '@LABRCNTIP' (procalcitonin:4,lacticidven:4) ) Recent Results (from the past 240 hour(s))  Resp Panel by RT-PCR  (Flu A&B, Covid) Nasopharyngeal Swab     Status: None   Collection Time: 10/21/20  5:48 PM   Specimen: Nasopharyngeal Swab; Nasopharyngeal(NP) swabs in vial transport medium  Result Value Ref Range Status   SARS Coronavirus 2 by RT PCR NEGATIVE NEGATIVE  Final    Comment: (NOTE) SARS-CoV-2 target nucleic acids are NOT DETECTED.  The SARS-CoV-2 RNA is generally detectable in upper respiratory specimens during the acute phase of infection. The lowest concentration of SARS-CoV-2 viral copies this assay can detect is 138 copies/mL. A negative result does not preclude SARS-Cov-2 infection and should not be used as the sole basis for treatment or other patient management decisions. A negative result may occur with  improper specimen collection/handling, submission of specimen other than nasopharyngeal swab, presence of viral mutation(s) within the areas targeted by this assay, and inadequate number of viral copies(<138 copies/mL). A negative result must be combined with clinical observations, patient history, and epidemiological information. The expected result is Negative.  Fact Sheet for Patients:  EntrepreneurPulse.com.au  Fact Sheet for Healthcare Providers:  IncredibleEmployment.be  This test is no t yet approved or cleared by the [place] FDA and  has been authorized for detection and/or diagnosis of SARS-CoV-2 by FDA under an Emergency Use Authorization (EUA). This EUA will remain  in effect (meaning this test can be used) for the duration of the COVID-19 declaration under Section 564(b)(1) of the Act, 21 U.S.C.section 360bbb-3(b)(1), unless the authorization is terminated  or revoked sooner.       Influenza A by PCR NEGATIVE NEGATIVE Final   Influenza B by PCR NEGATIVE NEGATIVE Final    Comment: (NOTE) The Xpert Xpress SARS-CoV-2/FLU/RSV plus assay is intended as an aid in the diagnosis of influenza from Nasopharyngeal swab specimens  and should not be used as a sole basis for treatment. Nasal washings and aspirates are unacceptable for Xpert Xpress SARS-CoV-2/FLU/RSV testing.  Fact Sheet for Patients: EntrepreneurPulse.com.au  Fact Sheet for Healthcare Providers: IncredibleEmployment.be  This test is not yet approved or cleared by the [place] FDA and has been authorized for detection and/or diagnosis of SARS-CoV-2 by FDA under an Emergency Use Authorization (EUA). This EUA will remain in effect (meaning this test can be used) for the duration of the COVID-19 declaration under Section 564(b)(1) of the Act, 21 U.S.C. section 360bbb-3(b)(1), unless the authorization is terminated or revoked.  Performed at [facility], [place] [address], [place], [place] [address]      Radiological Exams on Admission: CT Biopsy  Result Date: 10/21/2020 INDICATION: 75 year old male with a history of hypermetabolic left lower lobe lung mass EXAM: CT BIOPSY MEDICATIONS: None. ANESTHESIA/SEDATION: Moderate (conscious) sedation was employed during this procedure. A total of Versed 2.0 mg and Fentanyl 100 mcg was administered intravenously. Moderate Sedation Time: 14 minutes. The patient's level of consciousness and vital signs were monitored continuously by radiology nursing throughout the procedure under my direct supervision. FLUOROSCOPY TIME:  CT COMPLICATIONS: None PROCEDURE: The procedure, risks, benefits, and alternatives were explained to the patient and the patient's family. Specific risks that were addressed included bleeding, infection, pneumothorax, need for further procedure including chest tube placement, chance of delayed pneumothorax or hemorrhage, hemoptysis, nondiagnostic sample, cardiopulmonary collapse, death. Questions regarding the procedure were encouraged and answered. The patient understands and consents to the procedure. Patient was positioned in the left decubitus position  on the CT gantry table and a scout CT of the chest was performed for planning purposes. Once angle of approach was determined, the skin and subcutaneous tissues this scan was prepped and draped in the usual sterile fashion, and a sterile drape was applied covering the operative field. A sterile gown and sterile gloves were used for the procedure. Local anesthesia was provided with 1% Lidocaine. The skin  and subcutaneous tissues were infiltrated 1% lidocaine for local anesthesia, and a small stab incision was made with an 11 blade scalpel. Using CT guidance, a 17 gauge trocar needle was advanced into the left lower lobetarget. After confirmation of the tip, separate 18 gauge core biopsies were performed. These were placed into solution for transportation to the lab. Biosentry Device was deployed. A final CT image was performed. Patient tolerated the procedure well and remained hemodynamically stable throughout. No complications were encountered and no significant blood loss was encounter IMPRESSION: Status post CT-guided biopsy of left lower lobe of hypermetabolic mass. Signed, Dulcy Fanny. Dellia Nims, RPVI [facility] Radiology Electronically Signed   By: Corrie Mckusick D.O.   On: 10/21/2020 15:22   DG Chest Port 1 View  Result Date: 10/21/2020 CLINICAL DATA:  Sepsis. EXAM: PORTABLE CHEST 1 VIEW COMPARISON:  10/21/2020, earlier same day FINDINGS: 1511 hours. Persistent volume loss right hemithorax with shift of cardiomediastinal anatomy to the right. Right base atelectasis/infiltrate with right pleural effusion is similar to prior. Mass lesion left base again noted. No evidence for pneumothorax. Telemetry leads overlie the chest. IMPRESSION: No substantial interval change. Electronically Signed   By: Misty Stanley M.D.   On: 10/21/2020 17:21   DG Chest Port 1 View  Result Date: 10/21/2020 CLINICAL DATA:  Pain following biopsy EXAM: PORTABLE CHEST 1 VIEW COMPARISON:   PET-CT Oct 15, 2020 FINDINGS: No pneumothorax. Mass left lower lobe measuring 7.6 x 7.4 cm. Borders of this mass or ill-defined, potentially representing peripheral hemorrhage. Previously noted cavitation not well seen by radiography in this area. There is persistent airspace opacity in the right lower lobe with volume loss. Small right pleural effusion. Heart upper normal in size with pulmonary vascularity normal. No adenopathy. No bone lesions. IMPRESSION: No pneumothorax. Question peripheral hemorrhage surrounding left lower lobe mass. This mass measures 7.6 x 7.4 cm. Persistent infiltrate with consolidation and volume loss right lower lobe. Small right pleural effusion. Stable cardiac silhouette. Electronically Signed   By: Lowella Grip III M.D.   On: 10/21/2020 15:04      Assessment/Plan Principal Problem:   SIRS (systemic inflammatory response syndrome) (HCC) Active Problems:   COPD (chronic obstructive pulmonary disease) (HCC)   Type 2 diabetes mellitus (HCC)   Anemia in neoplastic disease   AF (paroxysmal atrial fibrillation) ([place])    1. SIRS -patient's fever and mental status are all improved and at baseline after coming to the ER.  Blood cultures obtained patient was empirically started on antibiotics.  Closely observe.  We will repeat a chest x-ray again in the morning. 2. Lung biopsy done today will repeat chest x-ray in the morning.  Chest x-ray done at admission shows no new changes. 3. Diabetes mellitus type 2 on sliding scale coverage. 4. Paroxysmal atrial fibrillation on Cardizem and metoprolol.  Does not take anticoagulation since patient has transfusion dependent anemia. 5. Chronic anemia follow CBC. 6. Lung cancer being followed by Dr. Julien Nordmann.  Addendum -patient's heart rate has gone up.  I have ordered patient's home dose of Cardizem and metoprolol unable to get an EKG.  Will check troponins and TSH.  DVT prophylaxis: SCDs.  Avoiding anticoagulation since patient  just had a lung biopsy. Code Status: DNR. Family Communication: Patient's wife. Disposition Plan: Home. Consults called: None. Admission status: Observation.   Rise Patience MD [facility] Pager [phone].  If 7PM-7AM, please contact night-coverage [email or web address] Password [facility]  10/21/2020, 9:44 PM

## 2020-10-21 NOTE — ED Provider Notes (Signed)
[facility] EMERGENCY DEPARTMENT Provider Note   CSN: [id number] Arrival date & time: 10/21/20  1650     History Chief Complaint  Patient presents with  . Altered Mental Status    Jeremy [name] is a 75 y.o. male.  Jeremy [name] presented from the interventional radiology suite where he was having a biopsy of a possible lung cancer lesion.  He has a known history of stage IV squamous cell carcinoma.  Suddenly, he began to have rigors and developed a fever of 102.  He was taken to the emergency department.  He does have a history of sepsis, and he most recently had MSSA bacteremia which eventually spread to his left eye.  He completed a whole course of treatment and had a recent follow-up with infectious disease.  He no longer has a port or any other implanted device.   The history is provided by the spouse (IR physician). The history is limited by the condition of the patient.  Fever Max temp prior to arrival:  102 Temp source:  Oral Severity:  Severe Onset quality:  Sudden Duration: just prior to arrival. Timing:  Constant Progression:  Unchanged Chronicity:  New Relieved by:  Nothing Worsened by:  Nothing Ineffective treatments:  None tried Associated symptoms: chills and confusion   Associated symptoms: no chest pain, no cough, no dysuria, no ear pain, no rash, no sore throat and no vomiting   Associated symptoms comment:  Has had urinary frequency for several days      Past Medical History:  Diagnosis Date  . Anemia   . Cancer ([place])    mass right lung  . COPD (chronic obstructive pulmonary disease) ([facility])    QUIT SMOKING 30 YRS AGO  . Diabetes ([place]) 2018  . Dysrhythmia    PAF  . Empyema ([place])   . HTN (hypertension)   . Hyperlipidemia   . Neuromuscular disorder ([place])   . PONV (postoperative nausea and vomiting)   . Psoriatic arthritis ([place]) 2015  . Sepsis ([place])    possibly from right port and traveled to eye    Patient Active Problem List    Diagnosis Date Noted  . Herniated nucleus pulposus, L3-4 08/30/2020  . Recurrent right pleural effusion   . Advanced care planning/counseling discussion   . Palliative care by specialist   . Palliative care encounter   . Loculated pleural effusion 11/08/2019  . 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 Port-A-Cath in place 11/05/2019  . Pleural effusion 11/05/2019  . Sepsis due to undetermined organism ([place]) 10/19/2019  . Thrombocytopenia ([place]) 10/19/2019  . Failure to thrive in adult 10/16/2019  . Generalized weakness 10/16/2019  . Drug-induced skin rash 09/23/2019  . 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 Encounter for antineoplastic chemotherapy 08/26/2019  . Encounter for antineoplastic immunotherapy 08/26/2019  . Goals of care, counseling/discussion 08/26/2019  . AF (paroxysmal atrial fibrillation) ([place])   . Protein-calorie malnutrition, severe 08/07/2019  . Hemoptysis 08/06/2019  . Mass of right lung 08/06/2019  . Former smoker 08/06/2019  . Lung mass 08/06/2019  . Postobstructive pneumonia 08/06/2019  . COPD (chronic obstructive pulmonary disease) ([place])   . HTN (hypertension)   . Abnormal weight loss   . Leukocytosis   . Acute and chronic respiratory failure with hypoxia ([place])   . Anemia in neoplastic disease   . Hyperglycemia   . Heme + stool 11/06/2018  . Type 2 diabetes mellitus ([place]) 2018  Past Surgical History:  Procedure Laterality Date  . BACK SURGERY  1990  . BIOPSY  12/30/2018   Procedure: BIOPSY;  Surgeon: Danie Binder, MD;  [facility];  Service: Endoscopy;;  gastric  . CATARACT EXTRACTION Bilateral   . CERVICAL SPINE SURGERY  1995  . COLONOSCOPY WITH PROPOFOL N/A 12/30/2018   Procedure: COLONOSCOPY WITH PROPOFOL;  Surgeon: Danie Binder, MD;  Location[facility];  Service: Endoscopy;  Laterality: N/A;  12:30pm  . ESOPHAGOGASTRODUODENOSCOPY (EGD) WITH PROPOFOL N/A 12/30/2018   Procedure:  ESOPHAGOGASTRODUODENOSCOPY (EGD) WITH PROPOFOL;  Surgeon: Danie Binder, MD;  [facility];  Service: Endoscopy;  Laterality: N/A;  . EYE SURGERY     detached retina  . IR IMAGING GUIDED PORT INSERTION  09/04/2019  . LUMBAR LAMINECTOMY/ DECOMPRESSION WITH MET-RX Right 08/30/2020   Procedure: Right Lumbar three-four Microdiscectomy;  Surgeon: Kristeen Miss, MD;  Location: [facility];  Service: Neurosurgery;  Laterality: Right;  . POLYPECTOMY  12/30/2018   Procedure: POLYPECTOMY;  Surgeon: Danie Binder, MD;  [facility];  Service: Endoscopy;;  colon  . THORACENTESIS Right 08/08/2019   Procedure: Thoracentesis;  Surgeon: [name], MD;  Location: [facility];  Service: Pulmonary;  Laterality: Right;  Marland Kitchen VIDEO BRONCHOSCOPY Right 08/08/2019   Procedure: Video Bronchoscopy with Erbe Cryo Biopsy of Right mainstem;  Surgeon: [name], MD;  Location[facility] OR;  Service: Pulmonary;  Laterality: Right;       Family History  Problem Relation Age of Onset  . Alcoholism Father   . CAD Brother   . Diabetes Brother   . Colon cancer Neg Hx   . Colon polyps Neg Hx   . Gastric cancer Neg Hx     Social History   Tobacco Use  . Smoking status: Former Smoker    Packs/day: 0.50    Years: 50.00    Pack years: 25.00    Types: Cigarettes    Quit date: 12/26/1991    Years since quitting: 28.8  . Smokeless tobacco: Never Used  Vaping Use  . Vaping Use: Never used  Substance Use Topics  . Alcohol use: Yes    Comment: occ beer  . Drug use: Never    Home Medications Prior to Admission medications   Medication Sig Start Date End Date Taking? Authorizing Provider  acetaminophen (TYLENOL) 325 MG tablet Take 2 tablets (650 mg total) by mouth every 6 (six) hours as needed for mild pain, fever or headache. Patient taking differently: Take 325 mg by mouth every 6 (six) hours as needed for mild pain, fever or headache. 10/28/19   Denton Brick, Courage, MD  albuterol (VENTOLIN HFA) 108 (90 Base)  MCG/ACT inhaler Inhale 2 puffs into the lungs every 6 (six) hours as needed for wheezing or shortness of breath.    [provider]  cyclobenzaprine (FLEXERIL) 10 MG tablet Take 10 mg by mouth at bedtime as needed for muscle spasms. 11/23/19   [provider]  diltiazem (CARDIZEM CD) 360 MG 24 hr capsule TAKE 1 CAPSULE BY MOUTH EVERY DAY Patient taking differently: Take 360 mg by mouth daily. 09/23/20   Arnoldo Lenis, MD  docusate sodium (COLACE) 100 MG capsule Take 100 mg by mouth daily.    [provider]  ferrous sulfate 325 (65 FE) MG tablet Take 325 mg by mouth daily with supper.    [provider]  folic acid (FOLVITE) 1 MG tablet Take 1 mg by mouth every  evening.    [provider]  gabapentin (NEURONTIN) 300 MG capsule Take 600 mg by mouth at bedtime.    [provider]  meclizine (ANTIVERT) 25 MG tablet Take 25 mg by mouth 3 (three) times daily as needed for dizziness.    [provider]  metFORMIN (GLUCOPHAGE-XR) 500 MG 24 hr tablet Take 500-1,000 mg by mouth See admin instructions. Take 2 tablets (1000 mg) by mouth in the morning & 1 tablet (500 mg) by mouth at night. 10/15/18   [provider]  methotrexate (RHEUMATREX) 2.5 MG tablet Take 10 mg by mouth every Sunday. Caution:Chemotherapy. Protect from light.    [provider]  metoprolol tartrate (LOPRESSOR) 25 MG tablet Take 1 tablet (25 mg total) by mouth 2 (two) times daily. 07/23/20   Arnoldo Lenis, MD  metroNIDAZOLE (METROCREAM) 0.75 % cream Apply 1 application topically 2 (two) times daily as needed (facial rosacea).     [provider]  mometasone (ELOCON) 0.1 % cream Apply 1 application topically 2 (two) times daily as needed (psoriasis).     [provider]  Multiple Vitamins-Minerals (CENTRUM ADULTS PO) Take 1 tablet by mouth daily at 6 (six) AM.    [provider]  Omega-3 Fatty Acids (FISH OIL) 1000 MG CAPS Take  1,000 mg by mouth daily.     [provider]  oxyCODONE-acetaminophen (PERCOCET/ROXICET) 5-325 MG tablet Take 1 tablet by mouth 2 (two) times daily as needed for severe pain.    [provider]  prednisoLONE acetate (PRED FORTE) 1 % ophthalmic suspension Place 1 drop into the left eye in the morning and at bedtime.    [provider]  tamsulosin (FLOMAX) 0.4 MG CAPS capsule Take 0.4 mg by mouth daily. 03/16/20   [provider]  traMADol (ULTRAM) 50 MG tablet Take 50 mg by mouth every 4 (four) hours as needed for moderate pain. for pain 08/19/18   [provider]    Allergies    Pembrolizumab, Naproxen sodium, and Nsaids  Review of Systems   Review of Systems  Constitutional: Positive for chills and fever.  HENT: Negative for ear pain and sore throat.   Eyes: Negative for pain and visual disturbance.  Respiratory: Negative for cough and shortness of breath.   Cardiovascular: Negative for chest pain and palpitations.  Gastrointestinal: Negative for abdominal pain and vomiting.  Genitourinary: Negative for dysuria and hematuria.  Musculoskeletal: Negative for arthralgias and back pain.  Skin: Negative for color change and rash.  Neurological: Negative for seizures and syncope.  Psychiatric/Behavioral: Positive for confusion.  All other systems reviewed and are negative.   Physical Exam Updated Vital Signs BP (!) 155/79   Pulse (!) 140   Temp 99.6 F (37.6 C) (Oral)   Resp (!) 29   SpO2 95%   Physical Exam Vitals and nursing note reviewed.  Constitutional:      Appearance: He is well-developed. He is ill-appearing.  HENT:     Head: Normocephalic and atraumatic.  Eyes:     Conjunctiva/sclera: Conjunctivae normal.  Cardiovascular:     Rate and Rhythm: Regular rhythm. Tachycardia present.     Heart sounds: No murmur heard.   Pulmonary:     Effort: Pulmonary effort is normal. No respiratory distress.     Breath sounds: Normal  breath sounds.  Abdominal:     Palpations: Abdomen is soft.     Tenderness: There is no abdominal tenderness.  Musculoskeletal:  General: No deformity or signs of injury.     Cervical back: Neck supple.  Skin:    General: Skin is warm and dry.  Neurological:     General: No focal deficit present.     Mental Status: He is alert.     Comments: Confused, writhing in bed Moves all extremities  Psychiatric:        Mood and Affect: Mood normal.     ED Results / Procedures / Treatments   Labs (all labs ordered are listed, but only abnormal results are displayed) Labs Reviewed  COMPREHENSIVE METABOLIC PANEL - Abnormal; Notable for the following components:      Result Value   Glucose, Bld 145 (*)    Calcium 8.6 (*)    Albumin 2.6 (*)    AST 12 (*)    All other components within normal limits  CBC WITH DIFFERENTIAL/PLATELET - Abnormal; Notable for the following components:   RBC 2.71 (*)    Hemoglobin 8.8 (*)    HCT 27.7 (*)    MCV 102.2 (*)    RDW 16.7 (*)    Neutro Abs 8.3 (*)    Lymphs Abs 0.5 (*)    All other components within normal limits  URINALYSIS, ROUTINE W REFLEX MICROSCOPIC - Abnormal; Notable for the following components:   Hgb urine dipstick SMALL (*)    Ketones, ur 20 (*)    Protein, ur 30 (*)    Bacteria, UA RARE (*)    All other components within normal limits  RESP PANEL BY RT-PCR (FLU A&B, COVID) ARPGX2  CULTURE, BLOOD (ROUTINE X 2)  CULTURE, BLOOD (ROUTINE X 2)  LACTIC ACID, PLASMA  LACTIC ACID, PLASMA  PROTIME-INR  BASIC METABOLIC PANEL  CBC    EKG None  Radiology CT Biopsy  Result Date: 10/21/2020 INDICATION: 75 year old male with a history of hypermetabolic left lower lobe lung mass EXAM: CT BIOPSY MEDICATIONS: None. ANESTHESIA/SEDATION: Moderate (conscious) sedation was employed during this procedure. A total of Versed 2.0 mg and Fentanyl 100 mcg was administered intravenously. Moderate Sedation Time: 14 minutes. The patient's level of  consciousness and vital signs were monitored continuously by radiology nursing throughout the procedure under my direct supervision. FLUOROSCOPY TIME:  CT COMPLICATIONS: None PROCEDURE: The procedure, risks, benefits, and alternatives were explained to the patient and the patient's family. Specific risks that were addressed included bleeding, infection, pneumothorax, need for further procedure including chest tube placement, chance of delayed pneumothorax or hemorrhage, hemoptysis, nondiagnostic sample, cardiopulmonary collapse, death. Questions regarding the procedure were encouraged and answered. The patient understands and consents to the procedure. Patient was positioned in the left decubitus position on the CT gantry table and a scout CT of the chest was performed for planning purposes. Once angle of approach was determined, the skin and subcutaneous tissues this scan was prepped and draped in the usual sterile fashion, and a sterile drape was applied covering the operative field. A sterile gown and sterile gloves were used for the procedure. Local anesthesia was provided with 1% Lidocaine. The skin and subcutaneous tissues were infiltrated 1% lidocaine for local anesthesia, and a small stab incision was made with an 11 blade scalpel. Using CT guidance, a 17 gauge trocar needle was advanced into the left lower lobetarget. After confirmation of the tip, separate 18 gauge core biopsies were performed. These were placed into solution for transportation to the lab. Biosentry Device was deployed. A final CT image was performed. Patient tolerated the procedure well and remained hemodynamically  stable throughout. No complications were encountered and no significant blood loss was encounter IMPRESSION: Status post CT-guided biopsy of left lower lobe of hypermetabolic mass. Signed, Dulcy Fanny. Dellia Nims, RPVI [facility] Radiology Electronically Signed   By: Corrie Mckusick  D.O.   On: 10/21/2020 15:22   DG Chest Port 1 View  Result Date: 10/21/2020 CLINICAL DATA:  Pain following biopsy EXAM: PORTABLE CHEST 1 VIEW COMPARISON:  PET-CT Oct 15, 2020 FINDINGS: No pneumothorax. Mass left lower lobe measuring 7.6 x 7.4 cm. Borders of this mass or ill-defined, potentially representing peripheral hemorrhage. Previously noted cavitation not well seen by radiography in this area. There is persistent airspace opacity in the right lower lobe with volume loss. Small right pleural effusion. Heart upper normal in size with pulmonary vascularity normal. No adenopathy. No bone lesions. IMPRESSION: No pneumothorax. Question peripheral hemorrhage surrounding left lower lobe mass. This mass measures 7.6 x 7.4 cm. Persistent infiltrate with consolidation and volume loss right lower lobe. Small right pleural effusion. Stable cardiac silhouette. Electronically Signed   By: Lowella Grip III M.D.   On: 10/21/2020 15:04    Procedures .Critical Care Performed by: Arnaldo Natal, MD Authorized by: Arnaldo Natal, MD   Critical care provider statement:    Critical care time (minutes):  30   Critical care time was exclusive of:  Separately billable procedures and treating other patients and teaching time   Critical care was necessary to treat or prevent imminent or life-threatening deterioration of the following conditions:  Sepsis   Critical care was time spent personally by me on the following activities:  Development of treatment plan with patient or surrogate, discussions with consultants, evaluation of patient's response to treatment, examination of patient, obtaining history from patient or surrogate, ordering and performing treatments and interventions, ordering and review of laboratory studies, ordering and review of radiographic studies, pulse oximetry, re-evaluation of patient's condition and review of old charts   I assumed direction of critical care for this patient from another  provider in my specialty: no     Care discussed with: admitting provider       Medications Ordered in ED Medications  cefTRIAXone (ROCEPHIN) 2 g in sodium chloride 0.9 % 100 mL IVPB (has no administration in time range)  azithromycin (ZITHROMAX) 500 mg in sodium chloride 0.9 % 250 mL IVPB (has no administration in time range)  sodium chloride 0.9 % bolus 2,571 mL (has no administration in time range)  acetaminophen (TYLENOL) tablet 1,000 mg (has no administration in time range)    ED Course  I have reviewed the triage vital signs and the nursing notes.  Pertinent labs & imaging results that were available during my care of the patient were reviewed by me and considered in my medical decision making (see chart for details).  Clinical Course as of 10/21/20 2245  Thu Oct 21, 2020  2120 I spoke with Dr. Paula Compton who will see the patient. [AW]    Clinical Course User Index [AW] Arnaldo Natal, MD   MDM Rules/Calculators/A&P                          Jeremy [name] presented to the ED from interventional radiology with fever, tachycardia, and confusion.  He was treated according to the sepsis algorithm for potential sepsis.  Ultimately, he does not appear to fit the definition for sepsis.  He was treated for possible pulmonary infection  given the recent instrumentation.  He will be admitted for observation, and after fluid resuscitation, antibiotics, and Tylenol he is doing much better. Final Clinical Impression(s) / ED Diagnoses Final diagnoses:  SIRS (systemic inflammatory response syndrome) ([place])  Stage IV squamous cell carcinoma of right lung [facility])    Rx / DC Orders ED Discharge Orders    None       Arnaldo Natal, MD 10/21/20 2248

## 2020-10-21 NOTE — ED Notes (Signed)
Pt crying out reporting back pain. Dr. Hal Hope informed via secure chat.

## 2020-10-21 NOTE — H&P (Signed)
Referring Physician(s): Mohamed,Mohamed  Supervising Physician: Corrie Mckusick  Patient Status:  [facility] OP  Chief Complaint:  "I'm having a lung biopsy"  Subjective: Patient familiar to IR service from Port-A-Cath placement on 09/04/2019 and right chest tube placement on 11/10/2019 due to concern for empyema.  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  He initially presented with a large necrotic mass extending to the carina with associated obstruction of the right lower lobe bronchus and postobstructive collapse/consolidation and necrosis in the right lower lobe as well as mediastinal lymphadenopathy and right effusion.  He has undergone prior chemoradiation currently under observation.  Follow-up PET scan on 10/18/2020 revealed:  1. 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 2. Mildly hypermetabolic left hilar nodes suspicious for metastatic adenopathy. 3. Stable appearing post treatment changes involving the right hemithorax as detailed above. Low level hypermetabolism likely treatment effect but could not exclude residual neoplasm. 4. No findings suspicious for abdominal/pelvic metastatic disease or osseous metastatic disease.  He currently denies fever, headache, chest pain, abdominal/back pain, nausea, vomiting or bleeding.  He does have some dyspnea with exertion and occasional cough.  He presents today for CT-guided left lung mass biopsy for further evaluation.  Past Medical History:  Diagnosis Date  . Anemia   . Cancer ([place])    mass right lung  . COPD (chronic obstructive pulmonary disease) ([facility])    QUIT SMOKING 30 YRS AGO  . Diabetes ([place]) 2018  . Dysrhythmia    PAF  . Empyema ([place])   . HTN  (hypertension)   . Hyperlipidemia   . Neuromuscular disorder ([place])   . PONV (postoperative nausea and vomiting)   . Psoriatic arthritis ([place]) 2015  . Sepsis ([place])    possibly from right port and traveled to eye   Past Surgical History:  Procedure Laterality Date  . BACK SURGERY  1990  . BIOPSY  12/30/2018   Procedure: BIOPSY;  Surgeon: Danie Binder, MD;  [facility];  Service: Endoscopy;;  gastric  . CATARACT EXTRACTION Bilateral   . CERVICAL SPINE SURGERY  1995  . COLONOSCOPY WITH PROPOFOL N/A 12/30/2018   Procedure: COLONOSCOPY WITH PROPOFOL;  Surgeon: Danie Binder, MD;  [facility];  Service: Endoscopy;  Laterality: N/A;  12:30pm  . ESOPHAGOGASTRODUODENOSCOPY (EGD) WITH PROPOFOL N/A 12/30/2018   Procedure: ESOPHAGOGASTRODUODENOSCOPY (EGD) WITH PROPOFOL;  Surgeon: Danie Binder, MD;  [facility];  Service: Endoscopy;  Laterality: N/A;  . EYE SURGERY     detached retina  . IR IMAGING GUIDED PORT INSERTION  09/04/2019  . LUMBAR LAMINECTOMY/ DECOMPRESSION WITH MET-RX Right 08/30/2020   Procedure: Right Lumbar three-four Microdiscectomy;  Surgeon: Kristeen Miss, MD;  Location: [place];  Service: Neurosurgery;  Laterality: Right;  . POLYPECTOMY  12/30/2018   Procedure: POLYPECTOMY;  Surgeon: Danie Binder, MD;  [facility];  Service: Endoscopy;;  colon  . THORACENTESIS Right 08/08/2019   Procedure: Thoracentesis;  Surgeon: [name], MD;  Location: [place];  Service: Pulmonary;  Laterality: Right;  Marland Kitchen VIDEO BRONCHOSCOPY Right 08/08/2019   Procedure: Video Bronchoscopy with Erbe Cryo Biopsy of Right mainstem;  Surgeon: [name], MD;  Location: [facility] OR;  Service: Pulmonary;  Laterality: Right;  Allergies: Pembrolizumab, Naproxen sodium, and Nsaids  Medications: Prior to Admission medications   Medication Sig Start Date End Date Taking? Authorizing Provider  acetaminophen (TYLENOL) 325 MG tablet Take 2 tablets (650 mg total) by  mouth every 6 (six) hours as needed for mild pain, fever or headache. Patient taking differently: Take 325 mg by mouth every 6 (six) hours as needed for mild pain, fever or headache. 10/28/19  Yes Emokpae, Courage, MD  albuterol (VENTOLIN HFA) 108 (90 Base) MCG/ACT inhaler Inhale 2 puffs into the lungs every 6 (six) hours as needed for wheezing or shortness of breath.   Yes [provider]  cyclobenzaprine (FLEXERIL) 10 MG tablet Take 10 mg by mouth at bedtime as needed for muscle spasms. 11/23/19  Yes [provider]  diltiazem (CARDIZEM CD) 360 MG 24 hr capsule TAKE 1 CAPSULE BY MOUTH EVERY DAY Patient taking differently: Take 360 mg by mouth daily. 09/23/20  Yes BranchDorothe Pea, MD  docusate sodium (COLACE) 100 MG capsule Take 100 mg by mouth daily.   Yes [provider]  ferrous sulfate 325 (65 FE) MG tablet Take 325 mg by mouth daily with supper.   Yes [provider]  folic acid (FOLVITE) 1 MG tablet Take 1 mg by mouth every evening.   Yes [provider]  gabapentin (NEURONTIN) 300 MG capsule Take 600 mg by mouth at bedtime.   Yes [provider]  meclizine (ANTIVERT) 25 MG tablet Take 25 mg by mouth 3 (three) times daily as needed for dizziness.   Yes [provider]  metFORMIN (GLUCOPHAGE-XR) 500 MG 24 hr tablet Take 500-1,000 mg by mouth See admin instructions. Take 2 tablets (1000 mg) by mouth in the morning & 1 tablet (500 mg) by mouth at night. 10/15/18  Yes [provider]  methotrexate (RHEUMATREX) 2.5 MG tablet Take 10 mg by mouth every Sunday. Caution:Chemotherapy. Protect from light.   Yes [provider]  metoprolol tartrate (LOPRESSOR) 25 MG tablet Take 1 tablet (25 mg total) by mouth 2 (two) times daily. 07/23/20  Yes Branch, Dorothe Pea, MD  metroNIDAZOLE (METROCREAM) 0.75 % cream Apply 1 application topically 2 (two) times daily as needed (facial rosacea).    Yes [provider]  mometasone  (ELOCON) 0.1 % cream Apply 1 application topically 2 (two) times daily as needed (psoriasis).    Yes [provider]  Multiple Vitamins-Minerals (CENTRUM ADULTS PO) Take 1 tablet by mouth daily at 6 (six) AM.   Yes [provider]  Omega-3 Fatty Acids (FISH OIL) 1000 MG CAPS Take 1,000 mg by mouth daily.    Yes [provider]  oxyCODONE-acetaminophen (PERCOCET/ROXICET) 5-325 MG tablet Take 1 tablet by mouth 2 (two) times daily as needed for severe pain.   Yes [provider]  prednisoLONE acetate (PRED FORTE) 1 % ophthalmic suspension Place 1 drop into the left eye in the morning and at bedtime.   Yes [provider]  tamsulosin (FLOMAX) 0.4 MG CAPS capsule Take 0.4 mg by mouth daily. 03/16/20  Yes [provider]  traMADol (ULTRAM) 50 MG tablet Take 50 mg by mouth every 4 (four) hours as needed for moderate pain. for pain 08/19/18  Yes [provider]     Vital Signs: BP 121/61   Pulse 97   Temp 98.1 F (36.7 C) (Oral)   Ht 6\' 2"  (1.88 m)   Wt 189 lb (85.7 kg)   SpO2 97%   BMI 24.27 kg/m  Physical Exam awake, alert.  Chest with diminished breath sounds on the right, left clear.  Heart with normal rate, occasional ectopy noted.  Abdomen soft, positive bowel sounds, nontender.  No lower extremity edema.  Imaging: No results found.  Labs:  CBC: Recent Labs    06/08/20 1155 08/26/20 0906 10/04/20 0946 10/21/20 0918  WBC 5.7 7.4 16.1* 9.4  HGB 9.9* 9.8* 9.5* 8.9*  HCT 31.1* 30.9* 29.2* 29.1*  PLT 148* 175 171 186    COAGS: Recent Labs    11/08/19 2023 10/21/20 0918  INR 1.1 1.0    BMP: Recent Labs    01/21/20 0831 01/28/20 0930 01/29/20 1431 02/04/20 0908 04/08/20 1035 06/08/20 1155 08/26/20 0906 10/04/20 0946 10/21/20 0918  NA 138 139 136 137   < > 141 140 137 137  K 4.2 4.1 4.1 4.3   < > 4.1 4.2 4.7 4.2  CL 101 101 97* 101   < > 107 104 100 102  CO2 _0 < > _1 GLUCOSE  130* 182* 135* 126*   < > 143* 131* 163* 148*  BUN _2 < > 23 20 33* 19  CALCIUM 9.7 9.5 8.6* 9.5   < > 8.8* 9.1 9.2 8.6*  CREATININE 0.76 0.79 0.77 0.78   < > 1.07 1.00 0.83 1.04  GFRNONAA >60 >60 >60 >60   < > >60 >60 >60 >60  GFRAA >60 >60 >60 >60  --   --   --   --   --    < > = values in this interval not displayed.    LIVER FUNCTION TESTS: Recent Labs    02/04/20 0908 04/08/20 1035 06/08/20 1155 10/04/20 0946  BILITOT 0.4 0.3 0.3 0.4  AST 11* 12* 12* 13*  ALT _3 ALKPHOS 99 82 84 62  PROT 7.9 7.8 7.8 7.2  ALBUMIN 2.4* 3.1* 3.2* 3.0*    Assessment and Plan: Patient familiar to IR service from Port-A-Cath placement on 09/04/2019 and right chest tube placement on 11/10/2019 due to concern for empyema.  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  He initially presented with a large necrotic mass extending to the carina with associated obstruction of the right lower lobe bronchus and postobstructive collapse/consolidation and necrosis in the right lower lobe as well as mediastinal lymphadenopathy and right effusion.  He has undergone prior chemoradiation currently under observation.  Follow-up PET scan on 10/18/2020 revealed:  1. 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 2. Mildly hypermetabolic left hilar nodes suspicious for metastatic adenopathy. 3. Stable appearing post treatment changes involving the right hemithorax as detailed above. Low level hypermetabolism likely treatment effect but could not exclude residual neoplasm. 4. No findings suspicious for abdominal/pelvic metastatic disease or osseous metastatic disease.  He currently denies fever, headache, chest pain,  abdominal/back pain, nausea, vomiting or bleeding.  He does have some dyspnea with exertion and occasional cough.  He presents today for CT-guided left lung mass biopsy for further evaluation.Risks and benefits of procedure was discussed with the patient  including, but not limited to bleeding, infection, damage to adjacent structures, pneumothorax requiring chest tube placement or low yield requiring additional tests.  All of the questions were answered and there is agreement to proceed.  Consent signed and in chart.    All of the questions were answered and there is agreement to proceed.  Consent signed and in chart.     Electronically Signed: D. Rowe Robert, PA-C 10/21/2020, 10:45 AM   I spent a total of 25 minutes at the the patient's bedside AND on the patient's hospital floor or unit, greater than 50% of which was counseling/coordinating care for CT guided left lung mass biopsy

## 2020-10-21 NOTE — Progress Notes (Signed)
Called by short stay at 14:16 due to patient with new onset 10/10 lower left back/chest wall pain at biopsy site. Per RN patient returned from IR without complaints and approximately 1 hour later when he was being helped to the bathroom he c/o acute 10/10 back/chest wall pain. His SpO2 dropped slightly as well and he was placed on 2L/min supplemental O2 via [place].  Patient seen in short stay, he is writhing in pain and has a difficult time explaining his symptoms due to pain. He denies any chest pain, significant dyspnea, cough, abdominal pain, n/v, headache, dizziness. He states the pain is "right where they did the surgery" and is a little worse when breathing. He is asking repeatedly for pain medications.  Left lower posterior chest wall biopsy site is clean, dry, with bandaid in place - there is no significant bruising or bleeding. No SQ emphysema. Patient is exquisitely tender to palpation from the biopsy site to the mid back as well as near his spine. Lung sounds diminished bilaterally, pre-procedure H&P notes diminished right sided lung sounds only.   Post procedure CXR reviewed by radiologist with no significant pneumothorax or other acute abnormality noted.  Plan:  - Fentanyl 25 mcg x 1 due to acute severe pain - Sit patient up  - PA to follow up in 1 hour to re-assess symptoms - Call IR PA or MD if symptoms worsen or new concerns arise  Candiss Norse, PA-C

## 2020-10-21 NOTE — Procedures (Signed)
Interventional Radiology Procedure Note  Procedure: CT guided biopsy of LLL mass Complications: None Recommendations: - Bedrest until CXR cleared.  Minimize talking, coughing or otherwise straining.  - Follow up 1 hr CXR pending  - NPO until CXR cleared  Signed,  Corrie Mckusick, DO

## 2020-10-21 NOTE — Sedation Documentation (Signed)
Patient is resting comfortably. 

## 2020-10-21 NOTE — Progress Notes (Signed)
Patient in excruciating pain, stating site where biopsy was done (below site ) was hurting. Site soft and bandaid CDI. O2 sat 88 percent placed on 2 liters O2. PA Larene Beach came to assess patient. Patient was given Iv Fentanyl as ordered. Patient is now rating pain 7/10. Will continue to monitor.

## 2020-10-21 NOTE — Progress Notes (Addendum)
1545 -  Patient c/ o chills, pain 5/10 left below bx site, HR 138, temp 98.7 oral and axillary.  Dr. Jacqualyn Posey came to assess patient and asked to give patient Ice Pack and food. Will reassess in 1 hour.  Ice Pack placed and food brought to room.  Patient stated he could not eat.

## 2020-10-22 ENCOUNTER — Other Ambulatory Visit (HOSPITAL_COMMUNITY): Payer: Self-pay

## 2020-10-22 ENCOUNTER — Observation Stay (HOSPITAL_COMMUNITY): Payer: Medicare Other

## 2020-10-22 DIAGNOSIS — D630 Anemia in neoplastic disease: Secondary | ICD-10-CM | POA: Diagnosis not present

## 2020-10-22 DIAGNOSIS — C3490 Malignant neoplasm of unspecified part of unspecified bronchus or lung: Secondary | ICD-10-CM | POA: Diagnosis not present

## 2020-10-22 DIAGNOSIS — J449 Chronic obstructive pulmonary disease, unspecified: Secondary | ICD-10-CM | POA: Diagnosis not present

## 2020-10-22 DIAGNOSIS — R509 Fever, unspecified: Secondary | ICD-10-CM | POA: Diagnosis not present

## 2020-10-22 DIAGNOSIS — Z85118 Personal history of other malignant neoplasm of bronchus and lung: Secondary | ICD-10-CM | POA: Diagnosis not present

## 2020-10-22 DIAGNOSIS — A419 Sepsis, unspecified organism: Secondary | ICD-10-CM | POA: Diagnosis not present

## 2020-10-22 DIAGNOSIS — R079 Chest pain, unspecified: Secondary | ICD-10-CM | POA: Diagnosis not present

## 2020-10-22 DIAGNOSIS — Z79899 Other long term (current) drug therapy: Secondary | ICD-10-CM | POA: Diagnosis not present

## 2020-10-22 DIAGNOSIS — E119 Type 2 diabetes mellitus without complications: Secondary | ICD-10-CM | POA: Diagnosis not present

## 2020-10-22 DIAGNOSIS — R6510 Systemic inflammatory response syndrome (SIRS) of non-infectious origin without acute organ dysfunction: Secondary | ICD-10-CM | POA: Diagnosis not present

## 2020-10-22 DIAGNOSIS — Z87891 Personal history of nicotine dependence: Secondary | ICD-10-CM | POA: Diagnosis not present

## 2020-10-22 DIAGNOSIS — Z7984 Long term (current) use of oral hypoglycemic drugs: Secondary | ICD-10-CM | POA: Diagnosis not present

## 2020-10-22 DIAGNOSIS — I480 Paroxysmal atrial fibrillation: Secondary | ICD-10-CM | POA: Diagnosis not present

## 2020-10-22 DIAGNOSIS — Z20822 Contact with and (suspected) exposure to covid-19: Secondary | ICD-10-CM | POA: Diagnosis not present

## 2020-10-22 DIAGNOSIS — I10 Essential (primary) hypertension: Secondary | ICD-10-CM | POA: Diagnosis not present

## 2020-10-22 DIAGNOSIS — R0602 Shortness of breath: Secondary | ICD-10-CM | POA: Diagnosis not present

## 2020-10-22 LAB — SURGICAL PATHOLOGY

## 2020-10-22 LAB — GLUCOSE, CAPILLARY
Glucose-Capillary: 149 mg/dL — ABNORMAL HIGH (ref 70–99)
Glucose-Capillary: 189 mg/dL — ABNORMAL HIGH (ref 70–99)

## 2020-10-22 LAB — BASIC METABOLIC PANEL
Anion gap: 6 (ref 5–15)
BUN: 14 mg/dL (ref 8–23)
CO2: 26 mmol/L (ref 22–32)
Calcium: 8.3 mg/dL — ABNORMAL LOW (ref 8.9–10.3)
Chloride: 105 mmol/L (ref 98–111)
Creatinine, Ser: 0.96 mg/dL (ref 0.61–1.24)
GFR, Estimated: >60 mL/min (ref >60)
Glucose, Bld: 152 mg/dL — ABNORMAL HIGH (ref 70–99)
Potassium: 4.1 mmol/L (ref 3.5–5.1)
Sodium: 137 mmol/L (ref 135–145)

## 2020-10-22 LAB — CBC
HCT: 28.3 % — ABNORMAL LOW (ref 39.0–52.0)
Hemoglobin: 8.7 g/dL — ABNORMAL LOW (ref 13.0–17.0)
MCH: 31.9 pg (ref 26.0–34.0)
MCHC: 30.7 g/dL (ref 30.0–36.0)
MCV: 103.7 fL — ABNORMAL HIGH (ref 80.0–100.0)
Platelets: 206 10*3/uL (ref 150–400)
RBC: 2.73 MIL/uL — ABNORMAL LOW (ref 4.22–5.81)
RDW: 16.7 % — ABNORMAL HIGH (ref 11.5–15.5)
WBC: 11.4 10*3/uL — ABNORMAL HIGH (ref 4.0–10.5)
nRBC: 0 % (ref 0.0–0.2)

## 2020-10-22 LAB — TROPONIN I (HIGH SENSITIVITY)
Troponin I (High Sensitivity): 15 ng/L (ref <18)
Troponin I (High Sensitivity): 14 ng/L (ref <18)

## 2020-10-22 LAB — TSH: TSH: 1.864 u[IU]/mL (ref 0.350–4.500)

## 2020-10-22 MED ORDER — TAMSULOSIN HCL 0.4 MG PO CAPS
0.4000 mg | ORAL_CAPSULE | Freq: Every day | ORAL | Status: DC
  Administered 2020-10-22: 0.4 mg via ORAL
  Filled 2020-10-22: qty 1

## 2020-10-22 MED ORDER — FOLIC ACID 1 MG PO TABS: 1.0000 mg | ORAL_TABLET | Freq: Every evening | ORAL | Status: DC

## 2020-10-22 MED ORDER — DILTIAZEM HCL ER COATED BEADS 180 MG PO CP24: 360.0000 mg | ORAL_CAPSULE | Freq: Every day | ORAL | Status: DC

## 2020-10-22 MED ORDER — METOPROLOL TARTRATE 25 MG PO TABS
25.0000 mg | ORAL_TABLET | Freq: Two times a day (BID) | ORAL | Status: DC
  Administered 2020-10-22: 25 mg via ORAL
  Filled 2020-10-22: qty 1

## 2020-10-22 MED ORDER — HYDROMORPHONE HCL 1 MG/ML IJ SOLN
1.0000 mg | INTRAMUSCULAR | Status: AC | PRN
  Administered 2020-10-22 (×3): 1 mg via INTRAVENOUS
  Filled 2020-10-22 (×4): qty 1

## 2020-10-22 MED ORDER — DOCUSATE SODIUM 100 MG PO CAPS: 100.0000 mg | ORAL_CAPSULE | Freq: Every day | ORAL | Status: DC

## 2020-10-22 MED ORDER — HYDROMORPHONE HCL 1 MG/ML IJ SOLN
1.0000 mg | Freq: Once | INTRAMUSCULAR | Status: AC
  Administered 2020-10-22: 1 mg via INTRAVENOUS

## 2020-10-22 MED ORDER — AZITHROMYCIN 250 MG PO TABS
ORAL_TABLET | ORAL | 0 refills | Status: DC
  Filled 2020-10-22: qty 3, 3d supply, fill #0

## 2020-10-22 MED ORDER — CEFDINIR 300 MG PO CAPS
300.0000 mg | ORAL_CAPSULE | Freq: Two times a day (BID) | ORAL | 0 refills | Status: AC
  Filled 2020-10-22: qty 6, 3d supply, fill #0

## 2020-10-22 MED ORDER — FERROUS SULFATE 325 (65 FE) MG PO TABS: 325.0000 mg | ORAL_TABLET | Freq: Every day | ORAL | Status: DC

## 2020-10-22 MED ORDER — METOPROLOL TARTRATE 25 MG PO TABS: 25.0000 mg | ORAL_TABLET | Freq: Two times a day (BID) | ORAL | Status: DC

## 2020-10-22 MED ORDER — LABETALOL HCL 5 MG/ML IV SOLN: 5.0000 mg | INTRAVENOUS | Status: DC | PRN

## 2020-10-22 MED ORDER — DILTIAZEM HCL ER COATED BEADS 180 MG PO CP24
360.0000 mg | ORAL_CAPSULE | Freq: Every day | ORAL | Status: DC
  Administered 2020-10-22: 360 mg via ORAL
  Filled 2020-10-22: qty 2

## 2020-10-22 NOTE — Progress Notes (Signed)
   10/22/20 0730  Assess: MEWS Score  BP (!) 142/86  Pulse Rate (!) 132  Resp 20  Level of Consciousness Alert  Assess: MEWS Score  MEWS Temp 0  MEWS Systolic 0  MEWS Pulse 3  MEWS RR 0  MEWS LOC 0  MEWS Score 3  MEWS Score Color Yellow  Assess: if the MEWS score is Yellow or Red  Were vital signs taken at a resting state? Yes  Focused Assessment No change from prior assessment  Treat  MEWS Interventions Administered scheduled meds/treatments;Escalated (See documentation below)  Pain Scale 0-10  Pain Score 10  Pain Location Back  Pain Orientation Left  Pain Descriptors / Indicators Stabbing  Pain Frequency Constant  Pain Onset On-going  Take Vital Signs  Increase Vital Sign Frequency  Yellow: Q 2hr X 2 then Q 4hr X 2, if remains yellow, continue Q 4hrs  Escalate  MEWS: Escalate Yellow: discuss with charge nurse/RN and consider discussing with provider and RRT  Notify: Provider  Provider Name/Title Dr. Wyline Copas  Date Provider Notified 10/22/20  Time Provider Notified 0730  Notification Type Page  Notification Reason Change in status  Provider response See new orders  Date of Provider Response 10/22/20  Time of Provider Response [phone]  Document  Patient Outcome Stabilized after interventions  Progress note created (see row info) Yes   Scheduled PO Lopressor and Cardizem given and new orders received for 1 time additional dose of 1mg  IV Dilaudid. HR Now decreased to 100-110 and pt sleeping.

## 2020-10-22 NOTE — Discharge Summary (Signed)
Physician Discharge Summary  Jeremy [name] YIF:[id number] DOB: 07/11/1945 DOA: 10/21/2020  PCP: [name], MD  Admit date: 10/21/2020 Discharge date: 10/22/2020  Admitted From: Home Disposition:  Home  Recommendations for Outpatient Follow-up:  1. Follow up with PCP in 1-2 weeks  Discharge Condition:Stable CODE STATUS:DNR Diet recommendation: Diabetic   Brief/Interim Summary: 75 y.o. male with history of paroxysmal atrial fibrillation not on anticoagulation secondary to transfusion dependent anemia with history of lung cancer being followed by Dr. Julien Nordmann had a lung biopsy today following which patient became acutely confused short of breath tachycardic and febrile.  Patient was transferred to the ER.  Patient recently had in March decompression of L3 and L4 spine.  After reaching ER patient was back to baseline.  Patient's fever resolved tachycardia improved and chest x-ray does not show anything acute.  Lab works are at baseline.  Lactic acid also normal.  Discharge Diagnoses:  Principal Problem:   SIRS (systemic inflammatory response syndrome) (HCC) Active Problems:   COPD (chronic obstructive pulmonary disease) (HCC)   Type 2 diabetes mellitus (HCC)   Anemia in neoplastic disease   AF (paroxysmal atrial fibrillation) ([place])  1. SIRS -patient's fever and mental status are all improved and at baseline after coming to the ER.  Blood cultures obtained and were thus far neg. Pt returned to baseline state and remained stable.  repeat CXR reviewed, unremarkable 2. Lung biopsy done on day of admit -  Repeat cxr reviewed, unremarkable. Discussed with Oncology team who will follow up with patient at next visit. 3. Diabetes mellitus type 2 - continued on sliding scale coverage. 4. Paroxysmal atrial fibrillation pt was continued on Cardizem and metoprolol. 5. Chronic anemia follow CBC. 6. Lung cancer being followed by Dr. Julien Nordmann.- per above, patient is to f/u with Oncology as next  scheduled apt  Discharge Instructions   Allergies as of 10/22/2020      Reactions   Naproxen Sodium Hives, Swelling, Rash, Other (See Comments)   "Made the face, lips, and mouth swell- had to go to the E.R."   Pembrolizumab Rash, Other (See Comments)   KEYTRUDA   Nsaids Hives, Swelling, Rash      Medication List    TAKE these medications   acetaminophen 325 MG tablet Commonly known as: TYLENOL Take 2 tablets (650 mg total) by mouth every 6 (six) hours as needed for mild pain, fever or headache. What changed:   how much to take  when to take this   albuterol 108 (90 Base) MCG/ACT inhaler Commonly known as: VENTOLIN HFA Inhale 2 puffs into the lungs See admin instructions. Inhale 2 puffs into the lungs in the morning, then every 6 hours as needed for shortness of breath or wheezing   azithromycin 250 MG tablet Commonly known as: Zithromax Take 1 tab po daily x 3 more days, zero refills   cefdinir 300 MG capsule Commonly known as: OMNICEF Take 1 capsule (300 mg total) by mouth 2 (two) times daily for 3 days.   CENTRUM ADULTS PO Take 1 tablet by mouth daily at 6 (six) AM.   cyclobenzaprine 10 MG tablet Commonly known as: FLEXERIL Take 10 mg by mouth at bedtime as needed for muscle spasms.   diltiazem 360 MG 24 hr capsule Commonly known as: CARDIZEM CD TAKE 1 CAPSULE BY MOUTH EVERY DAY What changed: how much to take   docusate sodium 100 MG capsule Commonly known as: COLACE Take 100 mg by mouth at bedtime.   ferrous  sulfate 325 (65 FE) MG tablet Take 325 mg by mouth daily with supper.   Fish Oil 1000 MG Caps Take 1,000 mg by mouth daily.   folic acid 1 MG tablet Commonly known as: FOLVITE Take 1 mg by mouth every evening.   gabapentin 300 MG capsule Commonly known as: NEURONTIN Take 600 mg by mouth at bedtime as needed (for neuropathy).   meclizine 25 MG tablet Commonly known as: ANTIVERT Take 25 mg by mouth 3 (three) times daily as needed for  dizziness.   metFORMIN 500 MG 24 hr tablet Commonly known as: GLUCOPHAGE-XR Take 500-1,000 mg by mouth See admin instructions. Take 1,000 mg by mouth in the morning and 500 mg at bedtime   methotrexate 2.5 MG tablet Commonly known as: RHEUMATREX Take 10 mg by mouth every Sunday. Caution:Chemotherapy. Protect from light.   metoprolol tartrate 25 MG tablet Commonly known as: LOPRESSOR Take 1 tablet (25 mg total) by mouth 2 (two) times daily.   metroNIDAZOLE 0.75 % cream Commonly known as: METROCREAM Apply 1 application topically 2 (two) times daily as needed (facial rosacea).   mometasone 0.1 % cream Commonly known as: ELOCON Apply 1 application topically 2 (two) times daily as needed (psoriasis).   oxyCODONE-acetaminophen 5-325 MG tablet Commonly known as: PERCOCET/ROXICET Take 1 tablet by mouth every 4 (four) hours as needed for severe pain.   prednisoLONE acetate 1 % ophthalmic suspension Commonly known as: PRED FORTE Place 1 drop into the left eye in the morning and at bedtime.   tamsulosin 0.4 MG Caps capsule Commonly known as: FLOMAX Take 0.4 mg by mouth daily.   traMADol 50 MG tablet Commonly known as: ULTRAM Take 50 mg by mouth every 4 (four) hours as needed for moderate pain.       Follow-up Information    [name], [name], MD. Schedule an appointment as soon as possible for a visit in 2 week(s).   Specialty: Family Medicine Contact information: [place] [address] [phone]        Arnoldo Lenis, MD .   Specialty: Cardiology Contact information: [address] [place] [address] [phone]        Curt Bears, MD Follow up.   Specialty: Oncology Why: as scheduled Contact information: [address] [place] [address] [phone]              Allergies  Allergen Reactions  . Naproxen Sodium Hives, Swelling, Rash and Other (See Comments)    "Made the face, lips, and mouth swell- had to go to the  E.R."  . Pembrolizumab Rash and Other (See Comments)    KEYTRUDA  . Nsaids Hives, Swelling and Rash    Procedures/Studies: CT Chest W Contrast  Result Date: 10/04/2020 CLINICAL DATA:  Non-small-cell lung cancer.  Restaging. EXAM: CT CHEST, ABDOMEN, AND PELVIS WITH CONTRAST TECHNIQUE: Multidetector CT imaging of the chest, abdomen and pelvis was performed following the standard protocol during bolus administration of intravenous contrast. CONTRAST:  149mL OMNIPAQUE IOHEXOL 300 MG/ML  SOLN COMPARISON:  06/07/2020 FINDINGS: CT CHEST FINDINGS Cardiovascular: The heart size is normal. No substantial pericardial effusion. Coronary artery calcification is evident. Atherosclerotic calcification is noted in the wall of the thoracic aorta. Mediastinum/Nodes: No mediastinal lymphadenopathy. There is no hilar lymphadenopathy. The esophagus has normal imaging features. There is no axillary lymphadenopathy. Lungs/Pleura: 6.2 x 5.0 cm cavitary mass in the left lower lobe has increased from 1.4 x 1.3 cm previously. 7 mm left upper lobe  pulmonary nodule is similar to prior. Volume loss right hemithorax compatible with right lower lobectomy bronchiectasis and scarring in the right lung is probably treatment related and similar to prior study. Confluent airspace disease at the right base seen previously has decreased in the interval. Stable right pleural effusion. Musculoskeletal: No worrisome lytic or sclerotic osseous abnormality. CT ABDOMEN PELVIS FINDINGS Hepatobiliary: No suspicious focal abnormality within the liver parenchyma. There is no evidence for gallstones, gallbladder wall thickening, or pericholecystic fluid. No intrahepatic or extrahepatic biliary dilation. Pancreas: No focal mass lesion. No dilatation of the main duct. No intraparenchymal cyst. No peripancreatic edema. Spleen: No splenomegaly. No focal mass lesion. Adrenals/Urinary Tract: No adrenal nodule or mass. Stable subcapsular 10 mm low-density lesion  lower pole left kidney, likely a cyst. Right kidney unremarkable No evidence for hydroureter. The urinary bladder appears normal for the degree of distention. Stomach/Bowel: Stomach is unremarkable. No gastric wall thickening. No evidence of outlet obstruction. Duodenum is normally positioned as is the ligament of Treitz. Duodenal diverticulum noted. No small bowel wall thickening. No small bowel dilatation. The terminal ileum is normal. No gross colonic mass. No colonic wall thickening. Diverticular changes are noted in the left colon without evidence of diverticulitis. Vascular/Lymphatic: There is abdominal aortic atherosclerosis without aneurysm. There is no gastrohepatic or hepatoduodenal ligament lymphadenopathy. No retroperitoneal or mesenteric lymphadenopathy. No pelvic sidewall lymphadenopathy. Reproductive: The prostate gland and seminal vesicles are unremarkable. Other: No intraperitoneal free fluid. Musculoskeletal: No worrisome lytic or sclerotic osseous abnormality. IMPRESSION: 1. Interval marked increase in size of the cavitary left lower lobe pulmonary mass. This could represent metastatic progression, metachronous primary, or infectious/inflammatory etiology. Tissue sampling may be warranted. 2. Post treatment changes in the right lung with improved aeration at the right base. 3. Stable 7 mm left upper lobe pulmonary nodule. 4. No evidence for metastatic disease in the abdomen or pelvis. 5. Stable right pleural effusion. 6. Left colonic diverticulosis without diverticulitis. 7. Aortic Atherosclerosis (ICD10-I70.0). Electronically Signed   By: Misty Stanley M.D.   On: 10/04/2020 16:22   CT Abdomen Pelvis W Contrast  Result Date: 10/04/2020 : CLINICAL DATA: Non-small-cell lung cancer. Restaging. EXAM: CT CHEST, ABDOMEN, AND PELVIS WITH CONTRAST TECHNIQUE: Multidetector CT imaging of the chest, abdomen and pelvis was performed following the standard protocol during bolus administration of intravenous  contrast. CONTRAST: 135mL OMNIPAQUE IOHEXOL 300 MG/ML SOLN COMPARISON: 06/07/2020 FINDINGS: CT CHEST FINDINGS Cardiovascular: The heart size is normal. No substantial pericardial effusion. Coronary artery calcification is evident. Atherosclerotic calcification is noted in the wall of the thoracic aorta. Mediastinum/Nodes: No mediastinal lymphadenopathy. There is no hilar lymphadenopathy. The esophagus has normal imaging features. There is no axillary lymphadenopathy. Lungs/Pleura: 6.2 x 5.0 cm cavitary mass in the left lower lobe has increased from 1.4 x 1.3 cm previously. 7 mm left upper lobe pulmonary nodule is similar to prior. Volume loss right hemithorax compatible with right lower lobectomy bronchiectasis and scarring in the right lung is probably treatment related and similar to prior study. Confluent airspace disease at the right base seen previously has decreased in the interval. Stable right pleural effusion. Musculoskeletal: No worrisome lytic or sclerotic osseous abnormality. CT ABDOMEN PELVIS FINDINGS Hepatobiliary: No suspicious focal abnormality within the liver parenchyma. There is no evidence for gallstones, gallbladder wall thickening, or pericholecystic fluid. No intrahepatic or extrahepatic biliary dilation. Pancreas: No focal mass lesion. No dilatation of the main duct. No intraparenchymal cyst. No peripancreatic edema. Spleen: No splenomegaly. No focal mass lesion. Adrenals/Urinary Tract: No adrenal  nodule or mass. Stable subcapsular 10 mm low-density lesion lower pole left kidney, likely a cyst. Right kidney unremarkable No evidence for hydroureter. The urinary bladder appears normal for the degree of distention. Stomach/Bowel: Stomach is unremarkable. No gastric wall thickening. No evidence of outlet obstruction. Duodenum is normally positioned as is the ligament of Treitz. Duodenal diverticulum noted. No small bowel wall thickening. No small bowel dilatation. The terminal ileum is normal. No  gross colonic mass. No colonic wall thickening. Diverticular changes are noted in the left colon without evidence of diverticulitis. Vascular/Lymphatic: There is abdominal aortic atherosclerosis without aneurysm. There is no gastrohepatic or hepatoduodenal ligament lymphadenopathy. No retroperitoneal or mesenteric lymphadenopathy. No pelvic sidewall lymphadenopathy. Reproductive: The prostate gland and seminal vesicles are unremarkable. Other: No intraperitoneal free fluid. Musculoskeletal: No worrisome lytic or sclerotic osseous abnormality. IMPRESSION: 1. Interval marked increase in size of the cavitary left lower lobe pulmonary mass. This could represent metastatic progression, metachronous primary, or infectious/inflammatory etiology. Tissue sampling may be warranted. 2. Post treatment changes in the right lung with improved aeration at the right base. 3. Stable 7 mm left upper lobe pulmonary nodule. 4. No evidence for metastatic disease in the abdomen or pelvis. 5. Stable right pleural effusion. 6. Left colonic diverticulosis without diverticulitis. 7. Aortic Atherosclerosis (ICD10-I70.0). Electronically Signed   By: Misty Stanley M.D.   On: 10/04/2020 16:30   NM PET Image Restage (PS) Skull Base to Thigh  Result Date: 10/18/2020 CLINICAL DATA:  Subsequent treatment strategy for non-small cell cancer. Initial diagnosis 08/08/2019. Status post chemotherapy. Evaluate new/enlarging left lower lobe lesion. EXAM: NUCLEAR MEDICINE PET SKULL BASE TO THIGH TECHNIQUE: 9.8 mCi F-18 FDG was injected intravenously. Full-ring PET imaging was performed from the skull base to thigh after the radiotracer. CT data was obtained and used for attenuation correction and anatomic localization. Fasting blood glucose: 115 mg/dl COMPARISON:  Recent CT examination 10/04/2020 and prior PET-CT 09/08/2019 FINDINGS: Mediastinal blood pool activity: SUV max 2.23 Liver activity: SUV max NA NECK: No hypermetabolic lymph nodes in the neck.  Incidental CT findings: none CHEST: The 5 cm cavitary lesion in the left lower lobe is hypermetabolic with SUV max of 08.14 and most consistent with a second primary lung neoplasm. Mildly hypermetabolic left hilar lymph nodes likely representing metastatic adenopathy. SUV max is 4.16. Extensive post treatment/radiation changes involving the right hemithorax with significant loss of volume, airspace consolidation and bronchiectasis. There is also a loculated pleural effusion with surrounding thickened pleura. Diffuse low level hypermetabolism throughout the right this likely post treatment change. SUV max is 4.51. Areas of recurrent tumor could not be excluded. No other pulmonary lesions are identified. Scattered small mediastinal and hilar lymph nodes without hypermetabolism. Incidental CT findings: Stable underlying emphysematous changes and areas of pulmonary scarring. Stable atherosclerotic calcifications involving the aorta and coronary arteries. ABDOMEN/PELVIS: No PET-CT findings suspicious for abdominal/pelvic metastatic disease. No hepatic or adrenal gland lesions are identified. Incidental CT findings: Stable atherosclerotic calcifications involving the aorta, iliac arteries and branch vessels. Stable advanced sigmoid colon diverticulosis. SKELETON: No findings suspicious for osseous metastatic disease. Incidental CT findings: none IMPRESSION: 1. 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 2. Mildly hypermetabolic left hilar nodes suspicious for metastatic adenopathy. 3. Stable appearing post treatment changes involving the right hemithorax as detailed above. Low level hypermetabolism likely treatment effect but could not exclude residual neoplasm. 4. No findings suspicious for abdominal/pelvic metastatic  disease or osseous metastatic disease. Electronically Signed   By: Marijo Sanes M.D.   On:  10/18/2020 09:52   CT Biopsy  Result Date: 10/21/2020 INDICATION: 75 year old male with a history of hypermetabolic left lower lobe lung mass EXAM: CT BIOPSY MEDICATIONS: None. ANESTHESIA/SEDATION: Moderate (conscious) sedation was employed during this procedure. A total of Versed 2.0 mg and Fentanyl 100 mcg was administered intravenously. Moderate Sedation Time: 14 minutes. The patient's level of consciousness and vital signs were monitored continuously by radiology nursing throughout the procedure under my direct supervision. FLUOROSCOPY TIME:  CT COMPLICATIONS: None PROCEDURE: The procedure, risks, benefits, and alternatives were explained to the patient and the patient's family. Specific risks that were addressed included bleeding, infection, pneumothorax, need for further procedure including chest tube placement, chance of delayed pneumothorax or hemorrhage, hemoptysis, nondiagnostic sample, cardiopulmonary collapse, death. Questions regarding the procedure were encouraged and answered. The patient understands and consents to the procedure. Patient was positioned in the left decubitus position on the CT gantry table and a scout CT of the chest was performed for planning purposes. Once angle of approach was determined, the skin and subcutaneous tissues this scan was prepped and draped in the usual sterile fashion, and a sterile drape was applied covering the operative field. A sterile gown and sterile gloves were used for the procedure. Local anesthesia was provided with 1% Lidocaine. The skin and subcutaneous tissues were infiltrated 1% lidocaine for local anesthesia, and a small stab incision was made with an 11 blade scalpel. Using CT guidance, a 17 gauge trocar needle was advanced into the left lower lobetarget. After confirmation of the tip, separate 18 gauge core biopsies were performed. These were placed into solution for transportation to the lab. Biosentry Device was deployed. A final CT image was  performed. Patient tolerated the procedure well and remained hemodynamically stable throughout. No complications were encountered and no significant blood loss was encounter IMPRESSION: Status post CT-guided biopsy of left lower lobe of hypermetabolic mass. Signed, Dulcy Fanny. Dellia Nims, RPVI [facility] Electronically Signed   By: Corrie Mckusick D.O.   On: 10/21/2020 15:22   DG CHEST PORT 1 VIEW  Result Date: 10/22/2020 CLINICAL DATA:  75 year old male with shortness of breath and chest pain. Sepsis. Non-small cell lung cancer. Status post CT-guided biopsy of left lower lobe mass yesterday. EXAM: PORTABLE CHEST 1 VIEW COMPARISON:  PET-CT 10/21/2020 and earlier. 10/15/2020. Chest radiographs FINDINGS: Portable AP upright view at 0626 hours. Lower lung volumes. Mildly rotated to the right. Stable cardiac size and mediastinal contours. Visualized tracheal air column is within normal limits. No pneumothorax or pulmonary edema. Grossly stable superior segment left lower lobe mass, which was biopsied with CT guidance yesterday. 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 No new pulmonary opacity. Negative visible bowel gas pattern. Stable visualized osseous structures. IMPRESSION: 1. No pneumothorax, and grossly stable left lower lobe superior segment mass which was biopsied with CT guidance yesterday. 2. Stable right lung opacity thought related to post treatment/radiation change with fibrothorax. Electronically Signed   By: Genevie Ann M.D.   On: 10/22/2020 06:39   DG Chest Port 1 View  Result Date: 10/21/2020 CLINICAL DATA:  Sepsis. EXAM: PORTABLE CHEST 1 VIEW COMPARISON:  10/21/2020, earlier same day FINDINGS: 1511 hours. Persistent volume loss right hemithorax with shift of cardiomediastinal anatomy to the right. Right base atelectasis/infiltrate with right pleural effusion is similar to prior.  Mass  lesion left base again noted. No evidence for pneumothorax. Telemetry leads overlie the chest. IMPRESSION: No substantial interval change. Electronically Signed   By: Misty Stanley M.D.   On: 10/21/2020 17:21   DG Chest Port 1 View  Result Date: 10/21/2020 CLINICAL DATA:  Pain following biopsy EXAM: PORTABLE CHEST 1 VIEW COMPARISON:  PET-CT Oct 15, 2020 FINDINGS: No pneumothorax. Mass left lower lobe measuring 7.6 x 7.4 cm. Borders of this mass or ill-defined, potentially representing peripheral hemorrhage. Previously noted cavitation not well seen by radiography in this area. There is persistent airspace opacity in the right lower lobe with volume loss. Small right pleural effusion. Heart upper normal in size with pulmonary vascularity normal. No adenopathy. No bone lesions. IMPRESSION: No pneumothorax. Question peripheral hemorrhage surrounding left lower lobe mass. This mass measures 7.6 x 7.4 cm. Persistent infiltrate with consolidation and volume loss right lower lobe. Small right pleural effusion. Stable cardiac silhouette. Electronically Signed   By: Lowella Grip III M.D.   On: 10/21/2020 15:04     Subjective: Eager to go home  Discharge Exam: Vitals:   10/22/20 0730 10/22/20 0840  BP: (!) 142/86   Pulse: (!) 132 (!) 109  Resp: 20   Temp:    SpO2:     Vitals:   10/22/20 0048 10/22/20 0243 10/22/20 0730 10/22/20 0840  BP:  122/70 (!) 142/86   Pulse:  (!) 117 (!) 132 (!) 109  Resp:   20   Temp: 98.1 F (36.7 C) 98.2 F (36.8 C)    TempSrc: Oral Oral    SpO2:  96%    Weight:      Height:        General: Pt is alert, awake, not in acute distress Cardiovascular: RRR, S1/S2 +, no rubs, no gallops Respiratory: CTA bilaterally, no wheezing, no rhonchi Abdominal: Soft, NT, ND, bowel sounds + Extremities: no edema, no cyanosis   The results of significant diagnostics from this hospitalization (including imaging, microbiology, ancillary and laboratory) are listed below  for reference.     Microbiology: Recent Results (from the past 240 hour(s))  Culture, blood (Routine x 2)     Status: None (Preliminary result)   Collection Time: 10/21/20  4:57 PM   Specimen: BLOOD LEFT ARM  Result Value Ref Range Status   Specimen Description BLOOD LEFT ARM  Final   Special Requests   Final    BOTTLES DRAWN AEROBIC AND ANAEROBIC Blood Culture adequate volume   Culture   Final    NO GROWTH < 12 HOURS Performed at [facility], [place] [address] [place], [place] [address]    Report Status PENDING  Incomplete  Culture, blood (Routine x 2)     Status: None (Preliminary result)   Collection Time: 10/21/20  5:30 PM   Specimen: BLOOD RIGHT ARM  Result Value Ref Range Status   Specimen Description BLOOD RIGHT ARM  Final   Special Requests   Final    BOTTLES DRAWN AEROBIC AND ANAEROBIC Blood Culture adequate volume   Culture   Final    NO GROWTH < 12 HOURS Performed at [facility], [place] [address], [place], [place] [address]    Report Status PENDING  Incomplete  Resp Panel by RT-PCR (Flu A&B, Covid) Nasopharyngeal Swab     Status: None   Collection Time: 10/21/20  5:48 PM   Specimen: Nasopharyngeal Swab; Nasopharyngeal(NP) swabs in vial transport medium  Result Value Ref Range Status   SARS Coronavirus  2 by RT PCR NEGATIVE NEGATIVE Final    Comment: (NOTE) SARS-CoV-2 target nucleic acids are NOT DETECTED.  The SARS-CoV-2 RNA is generally detectable in upper respiratory specimens during the acute phase of infection. The lowest concentration of SARS-CoV-2 viral copies this assay can detect is 138 copies/mL. A negative result does not preclude SARS-Cov-2 infection and should not be used as the sole basis for treatment or other patient management decisions. A negative result may occur with  improper specimen collection/handling, submission of specimen other than nasopharyngeal swab, presence of viral mutation(s) within the areas targeted by this assay, and  inadequate number of viral copies(<138 copies/mL). A negative result must be combined with clinical observations, patient history, and epidemiological information. The expected result is Negative.  Fact Sheet for Patients:  EntrepreneurPulse.com.au  Fact Sheet for Healthcare Providers:  IncredibleEmployment.be  This test is no t yet approved or cleared by the [place] FDA and  has been authorized for detection and/or diagnosis of SARS-CoV-2 by FDA under an Emergency Use Authorization (EUA). This EUA will remain  in effect (meaning this test can be used) for the duration of the COVID-19 declaration under Section 564(b)(1) of the Act, 21 U.S.C.section 360bbb-3(b)(1), unless the authorization is terminated  or revoked sooner.       Influenza A by PCR NEGATIVE NEGATIVE Final   Influenza B by PCR NEGATIVE NEGATIVE Final    Comment: (NOTE) The Xpert Xpress SARS-CoV-2/FLU/RSV plus assay is intended as an aid in the diagnosis of influenza from Nasopharyngeal swab specimens and should not be used as a sole basis for treatment. Nasal washings and aspirates are unacceptable for Xpert Xpress SARS-CoV-2/FLU/RSV testing.  Fact Sheet for Patients: EntrepreneurPulse.com.au  Fact Sheet for Healthcare Providers: IncredibleEmployment.be  This test is not yet approved or cleared by the [place] FDA and has been authorized for detection and/or diagnosis of SARS-CoV-2 by FDA under an Emergency Use Authorization (EUA). This EUA will remain in effect (meaning this test can be used) for the duration of the COVID-19 declaration under Section 564(b)(1) of the Act, 21 U.S.C. section 360bbb-3(b)(1), unless the authorization is terminated or revoked.  Performed at [facility], [place] [address] [place],  [address]      Labs: BNP (last 3 results) No results for input(s): BNP in the last 8760 hours. Basic  Metabolic Panel: Recent Labs  Lab 10/21/20 0918 10/21/20 1746 10/22/20 0516  NA 137 136 137  K 4.2 4.1 4.1  CL 102 103 105  CO2 28 25 26   GLUCOSE 148* 145* 152*  BUN 19 16 14   CREATININE 1.04 0.95 0.96  CALCIUM 8.6* 8.6* 8.3*   Liver Function Tests: Recent Labs  Lab 10/21/20 1746  AST 12*  ALT 20  ALKPHOS 49  BILITOT 0.9  PROT 6.8  ALBUMIN 2.6*   No results for input(s): LIPASE, AMYLASE in the last 168 hours. No results for input(s): AMMONIA in the last 168 hours. CBC: Recent Labs  Lab 10/21/20 0918 10/21/20 1746 10/22/20 0516  WBC 9.4 9.6 11.4*  NEUTROABS 7.7 8.3*  --   HGB 8.9* 8.8* 8.7*  HCT 29.1* 27.7* 28.3*  MCV 104.3* 102.2* 103.7*  PLT 186 172 206   Cardiac Enzymes: No results for input(s): CKTOTAL, CKMB, CKMBINDEX, TROPONINI in the last 168 hours. BNP: Invalid input(s): POCBNP CBG: Recent Labs  Lab 10/21/20 0909 10/21/20 1409 10/22/20 0802 10/22/20 1212  GLUCAP 144* 110* 149* 189*   D-Dimer No results for input(s): DDIMER in the last  72 hours. Hgb A1c No results for input(s): HGBA1C in the last 72 hours. Lipid Profile No results for input(s): CHOL, HDL, LDLCALC, TRIG, CHOLHDL, LDLDIRECT in the last 72 hours. Thyroid function studies Recent Labs    10/22/20 0716  TSH 1.864   Anemia work up No results for input(s): VITAMINB12, FOLATE, FERRITIN, TIBC, IRON, RETICCTPCT in the last 72 hours. Urinalysis    Component Value Date/Time   COLORURINE YELLOW 10/21/2020 1850   APPEARANCEUR CLEAR 10/21/2020 1850   LABSPEC 1.014 10/21/2020 1850   PHURINE 5.0 10/21/2020 1850   GLUCOSEU NEGATIVE 10/21/2020 1850   HGBUR SMALL (A) 10/21/2020 1850   BILIRUBINUR NEGATIVE 10/21/2020 1850   KETONESUR 20 (A) 10/21/2020 1850   PROTEINUR 30 (A) 10/21/2020 1850   NITRITE NEGATIVE 10/21/2020 1850   LEUKOCYTESUR NEGATIVE 10/21/2020 1850   Sepsis Labs Invalid input(s): PROCALCITONIN,  WBC,  LACTICIDVEN Microbiology Recent Results (from the past 240  hour(s))  Culture, blood (Routine x 2)     Status: None (Preliminary result)   Collection Time: 10/21/20  4:57 PM   Specimen: BLOOD LEFT ARM  Result Value Ref Range Status   Specimen Description BLOOD LEFT ARM  Final   Special Requests   Final    BOTTLES DRAWN AEROBIC AND ANAEROBIC Blood Culture adequate volume   Culture   Final    NO GROWTH < 12 HOURS Performed at [facility], [place] [address], [place], [place] [address]    Report Status PENDING  Incomplete  Culture, blood (Routine x 2)     Status: None (Preliminary result)   Collection Time: 10/21/20  5:30 PM   Specimen: BLOOD RIGHT ARM  Result Value Ref Range Status   Specimen Description BLOOD RIGHT ARM  Final   Special Requests   Final    BOTTLES DRAWN AEROBIC AND ANAEROBIC Blood Culture adequate volume   Culture   Final    NO GROWTH < 12 HOURS Performed at [facility], [place] [address], [place], [place] [address]    Report Status PENDING  Incomplete  Resp Panel by RT-PCR (Flu A&B, Covid) Nasopharyngeal Swab     Status: None   Collection Time: 10/21/20  5:48 PM   Specimen: Nasopharyngeal Swab; Nasopharyngeal(NP) swabs in vial transport medium  Result Value Ref Range Status   SARS Coronavirus 2 by RT PCR NEGATIVE NEGATIVE Final    Comment: (NOTE) SARS-CoV-2 target nucleic acids are NOT DETECTED.  The SARS-CoV-2 RNA is generally detectable in upper respiratory specimens during the acute phase of infection. The lowest concentration of SARS-CoV-2 viral copies this assay can detect is 138 copies/mL. A negative result does not preclude SARS-Cov-2 infection and should not be used as the sole basis for treatment or other patient management decisions. A negative result may occur with  improper specimen collection/handling, submission of specimen other than nasopharyngeal swab, presence of viral mutation(s) within the areas targeted by this assay, and inadequate number of viral copies(<138 copies/mL). A negative result  must be combined with clinical observations, patient history, and epidemiological information. The expected result is Negative.  Fact Sheet for Patients:  EntrepreneurPulse.com.au  Fact Sheet for Healthcare Providers:  IncredibleEmployment.be  This test is no t yet approved or cleared by the [place] FDA and  has been authorized for detection and/or diagnosis of SARS-CoV-2 by FDA under an Emergency Use Authorization (EUA). This EUA will remain  in effect (meaning this test can be used) for the duration of the COVID-19 declaration under Section 564(b)(1)  of the Act, 21 U.S.C.section 360bbb-3(b)(1), unless the authorization is terminated  or revoked sooner.       Influenza A by PCR NEGATIVE NEGATIVE Final   Influenza B by PCR NEGATIVE NEGATIVE Final    Comment: (NOTE) The Xpert Xpress SARS-CoV-2/FLU/RSV plus assay is intended as an aid in the diagnosis of influenza from Nasopharyngeal swab specimens and should not be used as a sole basis for treatment. Nasal washings and aspirates are unacceptable for Xpert Xpress SARS-CoV-2/FLU/RSV testing.  Fact Sheet for Patients: EntrepreneurPulse.com.au  Fact Sheet for Healthcare Providers: IncredibleEmployment.be  This test is not yet approved or cleared by the [place] FDA and has been authorized for detection and/or diagnosis of SARS-CoV-2 by FDA under an Emergency Use Authorization (EUA). This EUA will remain in effect (meaning this test can be used) for the duration of the COVID-19 declaration under Section 564(b)(1) of the Act, 21 U.S.C. section 360bbb-3(b)(1), unless the authorization is terminated or revoked.  Performed at [facility], [place] [address] [place], [place] [address]    Time spent: 30 min  SIGNED:   Marylu Lund, MD  [facility] 10/22/2020, 3:25 PM  If 7PM-7AM, please contact night-coverage

## 2020-10-22 NOTE — Progress Notes (Signed)
SATURATION QUALIFICATIONS: (This note is used to comply with regulatory documentation for home oxygen)  Patient Saturations on Room Air at Rest = 93%  Patient Saturations on Room Air while Ambulating = 90%

## 2020-10-22 NOTE — Progress Notes (Signed)
Pt is miserable all not. Pt is up and down, tossing all around bed, and states that he just cant get comfortable. Pt has the urge to urinate every 30-75 mins and has to stand up. Output isn't significant. I offered condom cath and pt refused.   Had a conversation with pt and told him that staff is unable to come every 30 mis to help urinate. I offered condom cath again. He is willing to try.

## 2020-10-26 ENCOUNTER — Telehealth: Payer: Self-pay | Admitting: Medical Oncology

## 2020-10-26 LAB — CULTURE, BLOOD (ROUTINE X 2)
Culture: NO GROWTH
Culture: NO GROWTH
Special Requests: ADEQUATE
Special Requests: ADEQUATE

## 2020-10-26 NOTE — Telephone Encounter (Signed)
Asking blood culture results. I told her there was no growth in 2 blood cultures from 05/19.

## 2020-10-28 ENCOUNTER — Other Ambulatory Visit: Payer: Self-pay | Admitting: *Deleted

## 2020-10-28 NOTE — Progress Notes (Signed)
The proposed treatment discussed in cancer conference is for discussion purpose only and is not a binding recommendation. The patient was not physically examined nor present for their treatment options. Therefore, final treatment plans cannot be decided.  ?

## 2020-11-01 NOTE — Progress Notes (Signed)
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 He was found to have squamous cell carcinoma in the left lower lobe in May 2022.  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 Diagnosed in May 2022.   PD-L1 expression 20%  PRIOR THERAPY: 1) Palliative radiotherapy under the care of Dr. Sondra Come to the obstructive right lower lobe lung mass. 2) Systemic chemotherapy with carboplatin for AUC of 5, paclitaxel 175 mg/M2 and Keytruda 200 mg IV every 3 weeks with Neulasta support.  First dose September 02, 2019.  Status post 6 cycles.  His treatment is currently on hold. Last dose 01/21/20. Immunotherapy discontinued after   CURRENT THERAPY: Referral to radiation oncology   INTERVAL HISTORY: GIANMARCO ROYE 75 y.o. male returns to the clinic today for a follow-up visit. The patient was last seen in the clinic on 10/19/2020.  At that appointment, the patient had a CT scan and a PET scan that showed a new left lower lobe lung mass.  Dr. Julien Nordmann recommended that the patient undergo CT-guided core biopsy of this lesion to further evaluate it.  The patient had a CT-guided biopsy on 10/21/2020.  Following his procedure, the patient began experiencing excruciating pain tachycardia, fever, and some hypoxia.  Therefore, the patient was sent to the emergency room.  The patient had a chest x-ray performed which not show any acute abnormalities.  His blood cultures were negative for infection.  He was treated with antibiotics and discharged home on azithromycin and omnicef. He still is having intermittent fevers, especially at night. His temperature last night was reportedly 100.2-100.9. His urinalysis was also  negative.   Since that time, the patient is feeling fatigued.  He reports his baseline night sweats.  The patient reports dyspnea on exertion which he thinks is worse.  He also reports a worsening baseline cough which produces brown sputum.  He has not tried using any over-the-counter cough suppressants at this time.  The patient reports weight loss and decreased appetite he tries to drink Ensure or boost at least once per day. He denies any hemoptysis.  He denies any nausea, vomiting, diarrhea, or constipation.  Is any headache or visual changes.  The patient is here today for evaluation and to review the results of his biopsy.    MEDICAL HISTORY: Past Medical History:  Diagnosis Date  . Anemia   . Cancer ([place])    mass right lung  . COPD (chronic obstructive pulmonary disease) ([facility])    QUIT SMOKING 30 YRS AGO  . Diabetes ([place]) 2018  . Dysrhythmia    PAF  . Empyema ([place])   . HTN (hypertension)   . Hyperlipidemia   . Neuromuscular disorder ([place])   . PONV (postoperative nausea and vomiting)   . Psoriatic arthritis ([place]) 2015  . Sepsis ([place])    possibly from right port and traveled to eye    ALLERGIES:  is allergic to naproxen sodium, pembrolizumab, and nsaids.  MEDICATIONS:  Current Outpatient Medications  Medication Sig Dispense Refill  . acetaminophen (TYLENOL) 325 MG tablet Take 2 tablets (650 mg total) by mouth every 6 (six) hours as needed for mild pain, fever or headache. (Patient taking differently: Take 325 mg by mouth every 4 (four)  hours as needed for mild pain, fever or headache.) 120 tablet 0  . albuterol (VENTOLIN HFA) 108 (90 Base) MCG/ACT inhaler Inhale 2 puffs into the lungs See admin instructions. Inhale 2 puffs into the lungs in the morning, then every 6 hours as needed for shortness of breath or wheezing    . azithromycin (ZITHROMAX) 250 MG tablet Take 1 tablet by mouth daily for 3 more days 3 each 0  . cyclobenzaprine (FLEXERIL) 10 MG tablet Take 10 mg by mouth at  bedtime as needed for muscle spasms.    Marland Kitchen diltiazem (CARDIZEM CD) 360 MG 24 hr capsule TAKE 1 CAPSULE BY MOUTH EVERY DAY (Patient taking differently: Take 360 mg by mouth daily.) 30 capsule 6  . docusate sodium (COLACE) 100 MG capsule Take 100 mg by mouth at bedtime.    . ferrous sulfate 325 (65 FE) MG tablet Take 325 mg by mouth daily with supper.    . folic acid (FOLVITE) 1 MG tablet Take 1 mg by mouth every evening.    . gabapentin (NEURONTIN) 300 MG capsule Take 600 mg by mouth at bedtime as needed (for neuropathy).    . meclizine (ANTIVERT) 25 MG tablet Take 25 mg by mouth 3 (three) times daily as needed for dizziness.    . metFORMIN (GLUCOPHAGE-XR) 500 MG 24 hr tablet Take 500-1,000 mg by mouth See admin instructions. Take 1,000 mg by mouth in the morning and 500 mg at bedtime    . methotrexate (RHEUMATREX) 2.5 MG tablet Take 10 mg by mouth every Sunday. Caution:Chemotherapy. Protect from light.    . metoprolol tartrate (LOPRESSOR) 25 MG tablet Take 1 tablet (25 mg total) by mouth 2 (two) times daily. 180 tablet 3  . metroNIDAZOLE (METROCREAM) 0.75 % cream Apply 1 application topically 2 (two) times daily as needed (facial rosacea).     . mometasone (ELOCON) 0.1 % cream Apply 1 application topically 2 (two) times daily as needed (psoriasis).     . Multiple Vitamins-Minerals (CENTRUM ADULTS PO) Take 1 tablet by mouth daily at 6 (six) AM.    . Omega-3 Fatty Acids (FISH OIL) 1000 MG CAPS Take 1,000 mg by mouth daily.     Marland Kitchen oxyCODONE-acetaminophen (PERCOCET/ROXICET) 5-325 MG tablet Take 1 tablet by mouth every 4 (four) hours as needed for severe pain.    . prednisoLONE acetate (PRED FORTE) 1 % ophthalmic suspension Place 1 drop into the left eye in the morning and at bedtime.    . tamsulosin (FLOMAX) 0.4 MG CAPS capsule Take 0.4 mg by mouth daily.    . traMADol (ULTRAM) 50 MG tablet Take 50 mg by mouth every 4 (four) hours as needed for moderate pain.     No current facility-administered  medications for this visit.    SURGICAL HISTORY:  Past Surgical History:  Procedure Laterality Date  . BACK SURGERY  1990  . BIOPSY  12/30/2018   Procedure: BIOPSY;  Surgeon: Danie Binder, MD;  [facility];  Service: Endoscopy;;  gastric  . CATARACT EXTRACTION Bilateral   . CERVICAL SPINE SURGERY  1995  . COLONOSCOPY WITH PROPOFOL N/A 12/30/2018   Procedure: COLONOSCOPY WITH PROPOFOL;  Surgeon: Danie Binder, MD;  [facility];  Service: Endoscopy;  Laterality: N/A;  12:30pm  . ESOPHAGOGASTRODUODENOSCOPY (EGD) WITH PROPOFOL N/A 12/30/2018   Procedure: ESOPHAGOGASTRODUODENOSCOPY (EGD) WITH PROPOFOL;  Surgeon: Danie Binder, MD;  [facility];  Service: Endoscopy;  Laterality: N/A;  . EYE SURGERY  detached retina  . IR IMAGING GUIDED PORT INSERTION  09/04/2019  . LUMBAR LAMINECTOMY/ DECOMPRESSION WITH MET-RX Right 08/30/2020   Procedure: Right Lumbar three-four Microdiscectomy;  Surgeon: Kristeen Miss, MD;  Location: [facility];  Service: Neurosurgery;  Laterality: Right;  . POLYPECTOMY  12/30/2018   Procedure: POLYPECTOMY;  Surgeon: Danie Binder, MD;  [facility];  Service: Endoscopy;;  colon  . THORACENTESIS Right 08/08/2019   Procedure: Thoracentesis;  Surgeon: [name], MD;  Location: [facility];  Service: Pulmonary;  Laterality: Right;  Marland Kitchen VIDEO BRONCHOSCOPY Right 08/08/2019   Procedure: Video Bronchoscopy with Erbe Cryo Biopsy of Right mainstem;  Surgeon: [name], MD;  [facility];  Service: Pulmonary;  Laterality: Right;    REVIEW OF SYSTEMS:   Review of Systems  Constitutional: Positive for fatigue, appetite change, and unexplained weight loss.  Positive for intermittent fevers negative for chills. HENT:   Negative for mouth sores, nosebleeds, sore throat and trouble swallowing.   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 Respiratory: Positive for cough and shortness of breath.  Negative for hemoptysis and  wheezing.   Cardiovascular: Negative for chest pain and leg swelling.  Gastrointestinal: Negative for abdominal pain, constipation, diarrhea, nausea and vomiting.  Genitourinary: Negative for bladder incontinence, difficulty urinating, dysuria, frequency and hematuria.   Musculoskeletal: Negative for back pain, gait problem, neck pain and neck stiffness.  Skin: Negative for itching and rash.  Neurological: Negative for dizziness, extremity weakness, gait problem, headaches, light-headedness and seizures.  Hematological: Negative for adenopathy. Does not bruise/bleed easily.  Psychiatric/Behavioral: Negative for confusion, depression and sleep disturbance. The patient is not nervous/anxious.     PHYSICAL EXAMINATION:  There were no vitals taken for this visit.  ECOG PERFORMANCE STATUS: 2  Physical Exam  Constitutional: Oriented to person, place, and time and well-developed, well-nourished, and in no distress.  HENT:  Head: Normocephalic and atraumatic.  Mouth/Throat: Oropharynx is clear and moist. No oropharyngeal exudate.  Eyes: Conjunctivae are normal. Right eye exhibits no discharge. Left eye exhibits no discharge. No scleral icterus.  Neck: Normal range of motion. Neck supple.  Cardiovascular: Normal rate, regular rhythm, normal heart sounds and intact distal pulses.   Pulmonary/Chest: Effort normal and breath sounds normal. No respiratory distress. No wheezes. No rales.  Abdominal: Soft. Bowel sounds are normal. Exhibits no distension and no mass. There is no tenderness.  Musculoskeletal: Normal range of motion. Exhibits no edema.  Lymphadenopathy:    No cervical adenopathy.  Neurological: Alert and oriented to person, place, and time. Exhibits muscle wasting.  Examined in the wheelchair.  Skin: Skin is warm and dry. No rash noted. Not diaphoretic. No erythema. No pallor.  Psychiatric: Mood, memory and judgment normal.  Vitals reviewed.  LABORATORY DATA: Lab Results  Component  Value Date   WBC 11.4 (H) 10/22/2020   HGB 8.7 (L) 10/22/2020   HCT 28.3 (L) 10/22/2020   MCV 103.7 (H) 10/22/2020   PLT 206 10/22/2020      Chemistry      Component Value Date/Time   NA 137 10/22/2020 0516   K 4.1 10/22/2020 0516   CL 105 10/22/2020 0516   CO2 26 10/22/2020 0516   BUN 14 10/22/2020 0516   CREATININE 0.96 10/22/2020 0516   CREATININE 0.83 10/04/2020 0946      Component Value Date/Time   CALCIUM 8.3 (L) 10/22/2020 0516   ALKPHOS 49 10/21/2020 1746   AST 12 (L) 10/21/2020 1746   AST 13 (  L) 10/04/2020 0946   ALT 20 10/21/2020 1746   ALT 20 10/04/2020 0946   BILITOT 0.9 10/21/2020 1746   BILITOT 0.4 10/04/2020 0946       RADIOGRAPHIC STUDIES:  CT Chest W Contrast  Result Date: 10/04/2020 CLINICAL DATA:  Non-small-cell lung cancer.  Restaging. EXAM: CT CHEST, ABDOMEN, AND PELVIS WITH CONTRAST TECHNIQUE: Multidetector CT imaging of the chest, abdomen and pelvis was performed following the standard protocol during bolus administration of intravenous contrast. CONTRAST:  193m OMNIPAQUE IOHEXOL 300 MG/ML  SOLN COMPARISON:  06/07/2020 FINDINGS: CT CHEST FINDINGS Cardiovascular: The heart size is normal. No substantial pericardial effusion. Coronary artery calcification is evident. Atherosclerotic calcification is noted in the wall of the thoracic aorta. Mediastinum/Nodes: No mediastinal lymphadenopathy. There is no hilar lymphadenopathy. The esophagus has normal imaging features. There is no axillary lymphadenopathy. Lungs/Pleura: 6.2 x 5.0 cm cavitary mass in the left lower lobe has increased from 1.4 x 1.3 cm previously. 7 mm left upper lobe pulmonary nodule is similar to prior. Volume loss right hemithorax compatible with right lower lobectomy bronchiectasis and scarring in the right lung is probably treatment related and similar to prior study. Confluent airspace disease at the right base seen previously has decreased in the interval. Stable right pleural effusion.  Musculoskeletal: No worrisome lytic or sclerotic osseous abnormality. CT ABDOMEN PELVIS FINDINGS Hepatobiliary: No suspicious focal abnormality within the liver parenchyma. There is no evidence for gallstones, gallbladder wall thickening, or pericholecystic fluid. No intrahepatic or extrahepatic biliary dilation. Pancreas: No focal mass lesion. No dilatation of the main duct. No intraparenchymal cyst. No peripancreatic edema. Spleen: No splenomegaly. No focal mass lesion. Adrenals/Urinary Tract: No adrenal nodule or mass. Stable subcapsular 10 mm low-density lesion lower pole left kidney, likely a cyst. Right kidney unremarkable No evidence for hydroureter. The urinary bladder appears normal for the degree of distention. Stomach/Bowel: Stomach is unremarkable. No gastric wall thickening. No evidence of outlet obstruction. Duodenum is normally positioned as is the ligament of Treitz. Duodenal diverticulum noted. No small bowel wall thickening. No small bowel dilatation. The terminal ileum is normal. No gross colonic mass. No colonic wall thickening. Diverticular changes are noted in the left colon without evidence of diverticulitis. Vascular/Lymphatic: There is abdominal aortic atherosclerosis without aneurysm. There is no gastrohepatic or hepatoduodenal ligament lymphadenopathy. No retroperitoneal or mesenteric lymphadenopathy. No pelvic sidewall lymphadenopathy. Reproductive: The prostate gland and seminal vesicles are unremarkable. Other: No intraperitoneal free fluid. Musculoskeletal: No worrisome lytic or sclerotic osseous abnormality. IMPRESSION: 1. Interval marked increase in size of the cavitary left lower lobe pulmonary mass. This could represent metastatic progression, metachronous primary, or infectious/inflammatory etiology. Tissue sampling may be warranted. 2. Post treatment changes in the right lung with improved aeration at the right base. 3. Stable 7 mm left upper lobe pulmonary nodule. 4. No evidence  for metastatic disease in the abdomen or pelvis. 5. Stable right pleural effusion. 6. Left colonic diverticulosis without diverticulitis. 7. Aortic Atherosclerosis (ICD10-I70.0). Electronically Signed   By: EMisty StanleyM.D.   On: 10/04/2020 16:22   CT Abdomen Pelvis W Contrast  Result Date: 10/04/2020 : CLINICAL DATA: Non-small-cell lung cancer. Restaging. EXAM: CT CHEST, ABDOMEN, AND PELVIS WITH CONTRAST TECHNIQUE: Multidetector CT imaging of the chest, abdomen and pelvis was performed following the standard protocol during bolus administration of intravenous contrast. CONTRAST: 1051mOMNIPAQUE IOHEXOL 300 MG/ML SOLN COMPARISON: 06/07/2020 FINDINGS: CT CHEST FINDINGS Cardiovascular: The heart size is normal. No substantial pericardial effusion. Coronary artery calcification is evident. Atherosclerotic calcification is  noted in the wall of the thoracic aorta. Mediastinum/Nodes: No mediastinal lymphadenopathy. There is no hilar lymphadenopathy. The esophagus has normal imaging features. There is no axillary lymphadenopathy. Lungs/Pleura: 6.2 x 5.0 cm cavitary mass in the left lower lobe has increased from 1.4 x 1.3 cm previously. 7 mm left upper lobe pulmonary nodule is similar to prior. Volume loss right hemithorax compatible with right lower lobectomy bronchiectasis and scarring in the right lung is probably treatment related and similar to prior study. Confluent airspace disease at the right base seen previously has decreased in the interval. Stable right pleural effusion. Musculoskeletal: No worrisome lytic or sclerotic osseous abnormality. CT ABDOMEN PELVIS FINDINGS Hepatobiliary: No suspicious focal abnormality within the liver parenchyma. There is no evidence for gallstones, gallbladder wall thickening, or pericholecystic fluid. No intrahepatic or extrahepatic biliary dilation. Pancreas: No focal mass lesion. No dilatation of the main duct. No intraparenchymal cyst. No peripancreatic edema. Spleen: No  splenomegaly. No focal mass lesion. Adrenals/Urinary Tract: No adrenal nodule or mass. Stable subcapsular 10 mm low-density lesion lower pole left kidney, likely a cyst. Right kidney unremarkable No evidence for hydroureter. The urinary bladder appears normal for the degree of distention. Stomach/Bowel: Stomach is unremarkable. No gastric wall thickening. No evidence of outlet obstruction. Duodenum is normally positioned as is the ligament of Treitz. Duodenal diverticulum noted. No small bowel wall thickening. No small bowel dilatation. The terminal ileum is normal. No gross colonic mass. No colonic wall thickening. Diverticular changes are noted in the left colon without evidence of diverticulitis. Vascular/Lymphatic: There is abdominal aortic atherosclerosis without aneurysm. There is no gastrohepatic or hepatoduodenal ligament lymphadenopathy. No retroperitoneal or mesenteric lymphadenopathy. No pelvic sidewall lymphadenopathy. Reproductive: The prostate gland and seminal vesicles are unremarkable. Other: No intraperitoneal free fluid. Musculoskeletal: No worrisome lytic or sclerotic osseous abnormality. IMPRESSION: 1. Interval marked increase in size of the cavitary left lower lobe pulmonary mass. This could represent metastatic progression, metachronous primary, or infectious/inflammatory etiology. Tissue sampling may be warranted. 2. Post treatment changes in the right lung with improved aeration at the right base. 3. Stable 7 mm left upper lobe pulmonary nodule. 4. No evidence for metastatic disease in the abdomen or pelvis. 5. Stable right pleural effusion. 6. Left colonic diverticulosis without diverticulitis. 7. Aortic Atherosclerosis (ICD10-I70.0). Electronically Signed   By: Misty Stanley M.D.   On: 10/04/2020 16:30   NM PET Image Restage (PS) Skull Base to Thigh  Result Date: 10/18/2020 CLINICAL DATA:  Subsequent treatment strategy for non-small cell cancer. Initial diagnosis 08/08/2019. Status post  chemotherapy. Evaluate new/enlarging left lower lobe lesion. EXAM: NUCLEAR MEDICINE PET SKULL BASE TO THIGH TECHNIQUE: 9.8 mCi F-18 FDG was injected intravenously. Full-ring PET imaging was performed from the skull base to thigh after the radiotracer. CT data was obtained and used for attenuation correction and anatomic localization. Fasting blood glucose: 115 mg/dl COMPARISON:  Recent CT examination 10/04/2020 and prior PET-CT 09/08/2019 FINDINGS: Mediastinal blood pool activity: SUV max 2.23 Liver activity: SUV max NA NECK: No hypermetabolic lymph nodes in the neck. Incidental CT findings: none CHEST: The 5 cm cavitary lesion in the left lower lobe is hypermetabolic with SUV max of 08.14 and most consistent with a second primary lung neoplasm. Mildly hypermetabolic left hilar lymph nodes likely representing metastatic adenopathy. SUV max is 4.16. Extensive post treatment/radiation changes involving the right hemithorax with significant loss of volume, airspace consolidation and bronchiectasis. There is also a loculated pleural effusion with surrounding thickened pleura. Diffuse low level hypermetabolism throughout the right  this likely post treatment change. SUV max is 4.51. Areas of recurrent tumor could not be excluded. No other pulmonary lesions are identified. Scattered small mediastinal and hilar lymph nodes without hypermetabolism. Incidental CT findings: Stable underlying emphysematous changes and areas of pulmonary scarring. Stable atherosclerotic calcifications involving the aorta and coronary arteries. ABDOMEN/PELVIS: No PET-CT findings suspicious for abdominal/pelvic metastatic disease. No hepatic or adrenal gland lesions are identified. Incidental CT findings: Stable atherosclerotic calcifications involving the aorta, iliac arteries and branch vessels. Stable advanced sigmoid colon diverticulosis. SKELETON: No findings suspicious for osseous metastatic disease. Incidental CT findings: none IMPRESSION:  1. 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 2. Mildly hypermetabolic left hilar nodes suspicious for metastatic adenopathy. 3. Stable appearing post treatment changes involving the right hemithorax as detailed above. Low level hypermetabolism likely treatment effect but could not exclude residual neoplasm. 4. No findings suspicious for abdominal/pelvic metastatic disease or osseous metastatic disease. Electronically Signed   By: Marijo Sanes M.D.   On: 10/18/2020 09:52   CT Biopsy  Result Date: 10/21/2020 INDICATION: 75 year old male with a history of hypermetabolic left lower lobe lung mass EXAM: CT BIOPSY MEDICATIONS: None. ANESTHESIA/SEDATION: Moderate (conscious) sedation was employed during this procedure. A total of Versed 2.0 mg and Fentanyl 100 mcg was administered intravenously. Moderate Sedation Time: 14 minutes. The patient's level of consciousness and vital signs were monitored continuously by radiology nursing throughout the procedure under my direct supervision. FLUOROSCOPY TIME:  CT COMPLICATIONS: None PROCEDURE: The procedure, risks, benefits, and alternatives were explained to the patient and the patient's family. Specific risks that were addressed included bleeding, infection, pneumothorax, need for further procedure including chest tube placement, chance of delayed pneumothorax or hemorrhage, hemoptysis, nondiagnostic sample, cardiopulmonary collapse, death. Questions regarding the procedure were encouraged and answered. The patient understands and consents to the procedure. Patient was positioned in the left decubitus position on the CT gantry table and a scout CT of the chest was performed for planning purposes. Once angle of approach was determined, the skin and subcutaneous tissues this scan was prepped and draped in the usual sterile fashion, and a sterile drape was applied  covering the operative field. A sterile gown and sterile gloves were used for the procedure. Local anesthesia was provided with 1% Lidocaine. The skin and subcutaneous tissues were infiltrated 1% lidocaine for local anesthesia, and a small stab incision was made with an 11 blade scalpel. Using CT guidance, a 17 gauge trocar needle was advanced into the left lower lobetarget. After confirmation of the tip, separate 18 gauge core biopsies were performed. These were placed into solution for transportation to the lab. Biosentry Device was deployed. A final CT image was performed. Patient tolerated the procedure well and remained hemodynamically stable throughout. No complications were encountered and no significant blood loss was encounter IMPRESSION: Status post CT-guided biopsy of left lower lobe of hypermetabolic mass. Signed, Dulcy Fanny. Dellia Nims, RPVI [facility] Electronically Signed   By: Corrie Mckusick D.O.   On: 10/21/2020 15:22   DG CHEST PORT 1 VIEW  Result Date: 10/22/2020 CLINICAL DATA:  75 year old male with shortness of breath and chest pain. Sepsis. Non-small cell lung cancer. Status post CT-guided biopsy of left lower lobe mass yesterday. EXAM: PORTABLE CHEST 1 VIEW COMPARISON:  PET-CT 10/21/2020 and earlier. 10/15/2020. Chest radiographs FINDINGS: Portable AP upright view at 0626 hours. Lower lung volumes.  Mildly rotated to the right. Stable cardiac size and mediastinal contours. Visualized tracheal air column is within normal limits. No pneumothorax or pulmonary edema. Grossly stable superior segment left lower lobe mass, which was biopsied with CT guidance yesterday. 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 No new pulmonary opacity. Negative visible bowel gas pattern. Stable visualized osseous structures. IMPRESSION: 1. No pneumothorax, and grossly stable left lower lobe  superior segment mass which was biopsied with CT guidance yesterday. 2. Stable right lung opacity thought related to post treatment/radiation change with fibrothorax. Electronically Signed   By: Genevie Ann M.D.   On: 10/22/2020 06:39   DG Chest Port 1 View  Result Date: 10/21/2020 CLINICAL DATA:  Sepsis. EXAM: PORTABLE CHEST 1 VIEW COMPARISON:  10/21/2020, earlier same day FINDINGS: 1511 hours. Persistent volume loss right hemithorax with shift of cardiomediastinal anatomy to the right. Right base atelectasis/infiltrate with right pleural effusion is similar to prior. Mass lesion left base again noted. No evidence for pneumothorax. Telemetry leads overlie the chest. IMPRESSION: No substantial interval change. Electronically Signed   By: Misty Stanley M.D.   On: 10/21/2020 17:21   DG Chest Port 1 View  Result Date: 10/21/2020 CLINICAL DATA:  Pain following biopsy EXAM: PORTABLE CHEST 1 VIEW COMPARISON:  PET-CT Oct 15, 2020 FINDINGS: No pneumothorax. Mass left lower lobe measuring 7.6 x 7.4 cm. Borders of this mass or ill-defined, potentially representing peripheral hemorrhage. Previously noted cavitation not well seen by radiography in this area. There is persistent airspace opacity in the right lower lobe with volume loss. Small right pleural effusion. Heart upper normal in size with pulmonary vascularity normal. No adenopathy. No bone lesions. IMPRESSION: No pneumothorax. Question peripheral hemorrhage surrounding left lower lobe mass. This mass measures 7.6 x 7.4 cm. Persistent infiltrate with consolidation and volume loss right lower lobe. Small right pleural effusion. Stable cardiac silhouette. Electronically Signed   By: Lowella Grip III M.D.   On: 10/21/2020 15:04     ASSESSMENT/PLAN:  This is a very pleasant 75 year old Caucasian male with stage IIIc/IV (T4, N2, M0/M1 a) non-small cell lung cancer, squamous cell carcinoma presented with large necrotic mass extending to the carina with  associated obstruction of the right lower lobe bronchus and postobstructive collapse/consolidation and necrosis in the right lower lobe as well as mediastinal lymphadenopathy with 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 The patient has PD-L1 expression of 20%.  He was found to have squamous cell carcinoma in the left lower lobe in May 2022 which is thought to be a second primary with some suspicious left hilar adenopathy.  The patient previously completed a course of palliative radiotherapy to the obstructive right lower lobe lung mass under the care of Dr. Sondra Come.  He then underwent systemic chemotherapy with carboplatin and paclitaxel. He is status post 6 cycles he is treatment with [name] was discontinued after cycle #1 due to a significant skin rash.  His last dose of chemotherapy was on August 2021.  He had been on observation since that time.  In April/May 2022, the patient was found to have a left lower lobe lung mass.  The patient recently underwent a biopsy of this on 10/21/20.   Showed that this was also consistent with non-small cell lung cancer, squamous cell carcinoma.  The patient was seen with Dr. Julien Nordmann today.  Dr. Julien Nordmann had a lengthy discussion with the patient about his current condition and recommended treatment  options.  The patient did not tolerate chemotherapy in the past, Dr. Julien Nordmann recommends that we refer the patient to Dr. Sondra Come for consideration of radiation to this lesion.  If the patient is not a candidate for radiation, then we would have to discuss systemic chemotherapy.   The patient and his wife are in agreement with this plan.  I have placed a referral to radiation oncology.  We will arrange for the patient to have a restaging CT scan the chest, abdomen, and pelvis in 3 months.  We will see him back a few days after his scan for follow-up visit.  The patient was recently hospitalized for fever, tachycardia, and pain.  The patient  completed a course of antibiotics.  He still continues to have intermittent fevers.  No source of infection was found his blood cultures were negative, the patient's urinalysis was negative for signs of infection, and his chest x-ray did not show any acute infection.  Dr. Julien Nordmann believes that his fever could be tumor fever; however, recommend that the patient take doxycycline 100 mg twice daily for 10 days.  The patient's wife states that he has a full bottle of this at his house.   The patient's wife had some question about his tachycardia.  The patient reportedly follows with cardiology and has a follow-up appointment later this month.  He apparently used to be on antiarrhythmics.  Encouraged her to reach out to her cardiologist regarding questions regarding his tachycardia.  The patient reports insomnia.  Recommend that the patient try Tylenol PM or melatonin.  He is also going to follow-up with his primary care provider tomorrow.  I encouraged him to talk to his primary care provider regarding his pain management for his psoriatic arthritis for which she is currently prescribed oxycodone.  The patient also has peripheral neuropathy and diabetes.  Apparently gabapentin causes hallucinations.  I also encouraged him to follow-up with his primary care provider about alternatives for this.  The patient was advised to call immediately if he has any concerning symptoms in the interval. The patient voices understanding of current disease status and treatment options and is in agreement with the current care plan. All questions were answered. The patient knows to call the clinic with any problems, questions or concerns. We can certainly see the patient much sooner if necessary    No orders of the defined types were placed in this encounter.     Legacy Carrender L Primo Innis, PA-C 11/01/20  ADDENDUM: Hematology/Oncology Attending: I had a face-to-face encounter with the patient today.  I reviewed his  lab, scan and recommended his care plan.  This is a very pleasant 75 years old white male diagnosed with stage IIIc/IV (T4, N2, M0/M1 a) non-small cell lung cancer, squamous cell carcinoma in March 2021 with PD-L1 expression of 20%.  The patient underwent a course of palliative radiotherapy to the obstructive right lower lobe lung mass followed by systemic chemotherapy initially with carboplatin, paclitaxel and Keytruda but [name] was discontinued after cycle #2 secondary to significant and intolerable skin rash.  He continued 4 more cycles of systemic chemotherapy with carboplatin and paclitaxel and has been in observation since that time and feeling well. Unfortunately repeat imaging studies recently showed development of large necrotic left lower lobe lung mass.  The patient had a PET scan and it was significantly hypermetabolic. He had CT-guided core biopsy of this lesion by interventional radiology and it was consistent with squamous cell carcinoma of the lung. His procedure was  complicated with hospital admission for questionable sepsis. The patient is feeling much better today and he is here for evaluation and discussion of his treatment options. I had a lengthy discussion with the patient and his wife today about his current condition and treatment options. I discussed with the patient the option of palliative care and hospice referral versus palliative systemic chemotherapy versus definitive radiation. The patient is interested in proceeding with the radiotherapy and we will refer him to Dr. Sondra Come for discussion of this option. We will see him back for follow-up visit in 3 months for evaluation and repeat CT scan of the chest, abdomen pelvis for restaging of his disease. If he develop any other evidence for disease progression he may require systemic therapy or palliative care. The patient and his wife agreed to the current plan. The patient was advised to call immediately if he has any other  concerning symptoms in the interval.  Disclaimer: This note was dictated with voice recognition software. Similar sounding words can inadvertently be transcribed and may be missed upon review. Eilleen Kempf, MD 11/04/20

## 2020-11-04 ENCOUNTER — Encounter: Payer: Self-pay | Admitting: Physician Assistant

## 2020-11-04 ENCOUNTER — Inpatient Hospital Stay: Payer: Medicare Other | Attending: Internal Medicine | Admitting: Physician Assistant

## 2020-11-04 ENCOUNTER — Other Ambulatory Visit: Payer: Self-pay

## 2020-11-04 VITALS — BP 102/58 | HR 108 | Temp 99.5°F | Resp 20 | Ht 74.0 in | Wt 185.2 lb

## 2020-11-04 DIAGNOSIS — E1142 Type 2 diabetes mellitus with diabetic polyneuropathy: Secondary | ICD-10-CM | POA: Diagnosis not present

## 2020-11-04 DIAGNOSIS — C3432 Malignant neoplasm of lower lobe, left bronchus or lung: Secondary | ICD-10-CM | POA: Insufficient documentation

## 2020-11-04 DIAGNOSIS — Z7984 Long term (current) use of oral hypoglycemic drugs: Secondary | ICD-10-CM | POA: Diagnosis not present

## 2020-11-04 DIAGNOSIS — I10 Essential (primary) hypertension: Secondary | ICD-10-CM | POA: Insufficient documentation

## 2020-11-04 DIAGNOSIS — J439 Emphysema, unspecified: Secondary | ICD-10-CM | POA: Diagnosis not present

## 2020-11-04 DIAGNOSIS — Z9225 Personal history of immunosupression therapy: Secondary | ICD-10-CM | POA: Insufficient documentation

## 2020-11-04 DIAGNOSIS — Z7951 Long term (current) use of inhaled steroids: Secondary | ICD-10-CM | POA: Diagnosis not present

## 2020-11-04 DIAGNOSIS — G4700 Insomnia, unspecified: Secondary | ICD-10-CM | POA: Insufficient documentation

## 2020-11-04 DIAGNOSIS — Z9221 Personal history of antineoplastic chemotherapy: Secondary | ICD-10-CM | POA: Insufficient documentation

## 2020-11-04 DIAGNOSIS — R634 Abnormal weight loss: Secondary | ICD-10-CM | POA: Insufficient documentation

## 2020-11-04 DIAGNOSIS — E785 Hyperlipidemia, unspecified: Secondary | ICD-10-CM | POA: Diagnosis not present

## 2020-11-04 DIAGNOSIS — Z79899 Other long term (current) drug therapy: Secondary | ICD-10-CM | POA: Diagnosis not present

## 2020-11-04 DIAGNOSIS — R000 Tachycardia, unspecified: Secondary | ICD-10-CM | POA: Insufficient documentation

## 2020-11-04 DIAGNOSIS — Z7189 Other specified counseling: Secondary | ICD-10-CM

## 2020-11-04 DIAGNOSIS — R5383 Other fatigue: Secondary | ICD-10-CM | POA: Diagnosis not present

## 2020-11-04 DIAGNOSIS — C3491 Malignant neoplasm of unspecified part of right bronchus or lung: Secondary | ICD-10-CM | POA: Diagnosis not present

## 2020-11-04 DIAGNOSIS — Z923 Personal history of irradiation: Secondary | ICD-10-CM | POA: Insufficient documentation

## 2020-11-04 DIAGNOSIS — L4050 Arthropathic psoriasis, unspecified: Secondary | ICD-10-CM | POA: Diagnosis not present

## 2020-11-04 DIAGNOSIS — R61 Generalized hyperhidrosis: Secondary | ICD-10-CM | POA: Insufficient documentation

## 2020-11-05 ENCOUNTER — Telehealth: Payer: Self-pay | Admitting: Cardiology

## 2020-11-05 IMAGING — DX DG CHEST 1V PORT
1 series · 1 of 1 positions shown · non-contrast
Comparison: Radiograph 10/16/2019

CLINICAL DATA: Fever and urinary complaints

EXAM:
PORTABLE CHEST 1 VIEW

[chest ap]
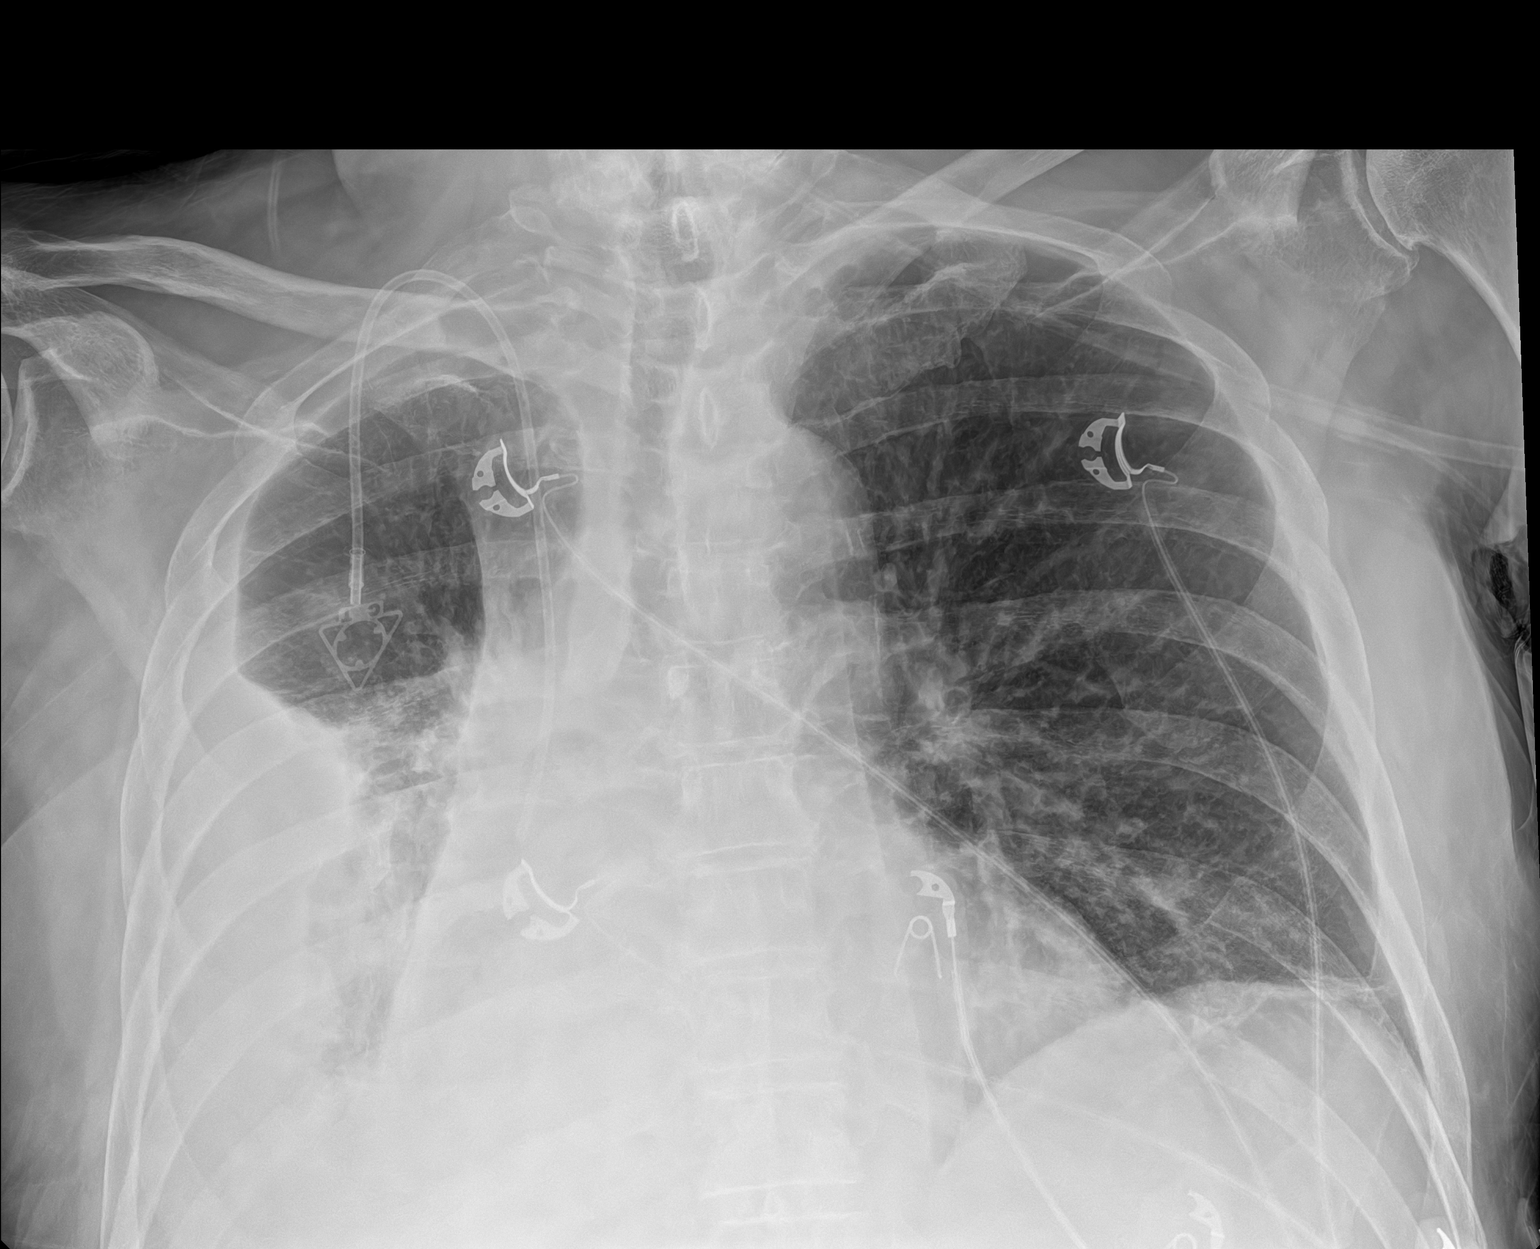

[1 of 1 positions shown; findings below may reference images not displayed]

FINDINGS: Likely increasing size of the complex right pleural effusion with
associated passive atelectasis though some underlying consolidation
is not excluded. A necrotic lung mass is again seen in the right
middle lobe, better visualized on cross-sectional imaging. Left lung
is predominantly clear aside from basilar atelectasis. Left apical
scarring as well. Cardiomediastinal contours are unchanged from
prior, partially obscured in the right lung base by overlying
opacity. Right IJ Port-A-Cath tip terminates at the superior
cavoatrial junction. Telemetry leads overlie the chest. Degenerative
changes are present in the imaged spine and shoulders.
IMPRESSION: 1. Likely increasing size of the complex right pleural effusion with
associated passive atelectasis though some underlying consolidation
is not excluded in the setting of fever.
2. Necrotic right lung mass, better visualized on cross-sectional
imaging.

## 2020-11-05 NOTE — Telephone Encounter (Signed)
Would you suggest pcp address ??

## 2020-11-05 NOTE — Telephone Encounter (Signed)
[name] (wife) notified & verbalized understanding.    She will continue to monitor him for now & will take to ED over the weekend if symptoms worsen.

## 2020-11-05 NOTE — Telephone Encounter (Signed)
Running fever off / on since then - did blood culture, but did not grow anything.   Left lung biopsy on 10/21/2020 & lung cancer.   Every since he had the bx, he has not done well.  100.6 100.9   Heart rate elevated with exertion.    Saw the oncologist yesterday, but oncologist suggested that they check with his cardiologist.  Will be having radiation specialist soon.    Is currently back on Doxycycline 100mg  twice a day.    He has been taking the Cardizem 360mg  daily & Lopressor 25mg  twice a day.    Wife states heart rate feels normal, does not feel like AFib.

## 2020-11-05 NOTE — Telephone Encounter (Signed)
New message   STAT if HR is under 50 or over 120 (normal HR is 60-100 beats per minute)  1) What is your heart rate? 120   2) Do you have a log of your heart rate readings (document readings)? no  3) Do you have any other symptoms? He has been having rapid heart rate off and on since May 15th , also has been running fever off and on

## 2020-11-05 NOTE — Telephone Encounter (Signed)
Patient of Dr. [name].  I reviewed the chart.  Complex medical history, recently seen in the oncology with active lung cancer and treatment planned.  Low-grade fevers were felt to be tumor fever per Oncology.  On antibiotics as well.  Question was why he was tachycardic and his most recent ECG showed sinus tachycardia.  He has a history of paroxysmal atrial fibrillation for which she follows with Dr. [name].  If there is a concern about whether he is out of rhythm now, only way to confirm that would be to get an ECG.  He certainly has risk for recurrent atrial fibrillation with his current condition, antiarrhythmic therapy could always be reconsidered.  I suspect that his shortness of breath is multifactorial.  If he continues to feel poorly, he will need to be clinically evaluated, possibly in the ER if this is over the weekend.

## 2020-11-08 ENCOUNTER — Encounter: Payer: Self-pay | Admitting: Radiation Oncology

## 2020-11-08 NOTE — Progress Notes (Signed)
Location of tumor and Histology per Pathology Report: right lung 10/21/2020   Biopsy: 10/21/2020 by Dr Earleen Newport of Interventional Radiology Left lower lobe lung needle core biopsy  Past/Anticipated interventions by surgeon, if any: none  Past/Anticipated interventions by medical oncology, if any:        Pain issues, if any:  no   SAFETY ISSUES:  Prior radiation? yes, right lung 08/11/2019 through 08/28/2019  Pacemaker/ICD? no  Possible current pregnancy?no  Is the patient on methotrexate? yes  Current Complaints / other details:  Poor appetite since lung biopsy     Vitals:   11/10/20 0911  BP: (!) 104/59  Pulse: (!) 101  Resp: 20  Temp: (!) 97 F (36.1 C)  TempSrc: Temporal  SpO2: 94%  Weight: 183 lb 2 oz (83.1 kg)  Height: 6\' 2"  (1.88 m)

## 2020-11-09 NOTE — Progress Notes (Signed)
Radiation Oncology         ([phone]) [phone] ________________________________  Outpatient Re-Consultation  Name: Jeremy [name] MRN: [id number]  Date: 11/10/2020  DOB: 27-May-1946  HY:[id number], [name], MD  Curt Bears, MD   REFERRING PHYSICIAN: Curt Bears, MD  DIAGNOSIS: Stage IIIC/IV (T4, N2, M0/M1a)non-small cell lung cancer, squamous cell carcinoma with potential malignant right pleural effusion, now with metastatic spread to the left lower lung or a new primary lung cancer Stage IIIa  (T3, N1, M0) squamous cell carcinoma  HISTORY OF PRESENT ILLNESS::Jeremy [name] is a 75 y.o. male who is accompanied by his wife. he is seen as a courtesy of Dr. Julien Nordmann for an opinion concerning radiation therapy as part of management for his recently diagnosed non-small cell lung cancer (diagnosed in May 2022) presenting in the left lower lung. The patient initially presented for consultation here on 08/08/19. He completed his most recent radiation interval on 08/28/19 of which he was noted to recover well from based on PET scan.   Following his initial consult, the patient followed up with Dr. Julien Nordmann on 10/14/19 in which it was noted that he was tolerating his chemotherapy well (carboplatin and paclitaxel). However, the patient was admitted to the ED on 10/16/19 for generalized weakness due to chemotherapy which resulted in a fall. Chest x-ray taken during the visit showed the right necrotic lung mass with increase in pleural effusion. The patient was discharged 2 days later.  The patient was again admitted to ED on 10/19/19 due to fever and frequency without hematuria accompanied by minor confusion. Chest radiograph taken during the visit showed trace in size of complex of right pleural effusion with associated passive atelectasis. It was noted that some underlying consolidation was not to be excluded in causing his fever.  Chest CT taken on 11/07/19 showed concern for increased size of right pleural  effusion with question of empyema raised.  In light of these findings, the patient was instructed to present to the emergency department if he develops signs of infection. The patient then admitted himself to the ED on 11/08/19. Chest x-ray from visit revealed a stable loculated right pleural effusion with compressive atelectasis  The patient received a chest CT on 02/06/20 showing multiple new cavitary nodules of the left lung, the largest in the peripheral left upper lobe measuring 2.1 x 1.5 cm. Extensive septal thickening and irregular consolidation of the right lung was also seen, with near total consolidation of the right lower lobe. Findings were noted to be consistent with malignancy.  (Of note: the patient presented to [facility] on 02/07/2020 for blurry vision, eye redness, and positive blood cultures. )  Chest CT taken on 06/07/2020 showed that the majority of previously seen pulmonary nodules in the left lobe had completely resolved or significantly improved, generally with irregular, somewhat bandlike residua, however, a new cavitary nodule of the central left lower lobe measuring 1.5 x 1.5 cm was noted. Post treatment appearance of the right chest was noted to otherwise not have  significantly changed, with extensive fibrotic consolidation and volume loss of the right lower and right middle lobes noted, as well as some evidence of septal thickening; most likely reflecting a combination of lymphangitic spread of disease and post treatment consolidation/fibrosis.  Chest CT taken on 10/04/20 showed an interval marked increase in size of the cavitary left lower lobe pulmonary mass. Possibly suggestive of metastatic progression or infection. The left upper lobe pulmonary nodule was noted to be stable,  then measuring 7 mm.   On 10/21/20, the patient was transferred to ED following receiving CT guided biopsy in which the patient became acutely confused short of breath  tachycardic and febrile. Surgical pathology results from left lower lobe needle core biopsy taken on 10/21/20 prior to the ED visit were indicative squamous cell carcinoma. After arriving to the ED, the patient returned back to normal; chest x-ray taken upon his admission showed no acute abnormalities. Given his symptoms, the patient was admitted for further observation. Since the patient had a biopsy taken, he was treated emperically for lung infection.   Since then, the patient followed up with Cassandra Heilingoetter PA-C on 11/04/20 to review the results of his biopsy.  During the visit, he reported recently feeling fatigued, along with dyspnea on exertion (which he thinks has become progresively worse), and a worsening baseline cough which produces brown sputum. The patient also noted that he had recently lost weight due to a decrease in appetite (he has been trying to drink ensure or boost at least once a day). The patient was noted to deny any nausea, vomiting, diarrhea, constipation, headache, or vision changes.        PREVIOUS RADIATION THERAPY: Yes   Radiation Treatment Dates: 08/11/2019 through 08/28/2019 Site Technique Total Dose (Gy) Dose per Fx (Gy) Completed Fx Beam Energies  Lung, Right: Lung_Rt 3D 35/35 2.5 14/14 6X, 10X, 15X     PAST MEDICAL HISTORY:  Past Medical History:  Diagnosis Date  . Anemia   . Cancer ([place])    mass right lung  . COPD (chronic obstructive pulmonary disease) ([facility])    QUIT SMOKING 30 YRS AGO  . Diabetes ([place]) 2018  . Dysrhythmia    PAF  . Empyema ([place])   . History of radiation therapy 08/11/2019 through 08/28/2019   right lung        Dr Gery Pray  . HTN (hypertension)   . Hyperlipidemia   . Neuromuscular disorder ([place])   . PONV (postoperative nausea and vomiting)   . Psoriatic arthritis ([place]) 2015  . Sepsis ([place])    possibly from right port and traveled to eye    PAST SURGICAL HISTORY: Past Surgical History:  Procedure Laterality Date  .  BACK SURGERY  1990  . BIOPSY  12/30/2018   Procedure: BIOPSY;  Surgeon: Danie Binder, MD;  [facility];  Service: Endoscopy;;  gastric  . CATARACT EXTRACTION Bilateral   . CERVICAL SPINE SURGERY  1995  . COLONOSCOPY WITH PROPOFOL N/A 12/30/2018   Procedure: COLONOSCOPY WITH PROPOFOL;  Surgeon: Danie Binder, MD;  [facility];  Service: Endoscopy;  Laterality: N/A;  12:30pm  . ESOPHAGOGASTRODUODENOSCOPY (EGD) WITH PROPOFOL N/A 12/30/2018   Procedure: ESOPHAGOGASTRODUODENOSCOPY (EGD) WITH PROPOFOL;  Surgeon: Danie Binder, MD;  [facility];  Service: Endoscopy;  Laterality: N/A;  . EYE SURGERY     detached retina  . IR IMAGING GUIDED PORT INSERTION  09/04/2019  . LUMBAR LAMINECTOMY/ DECOMPRESSION WITH MET-RX Right 08/30/2020   Procedure: Right Lumbar three-four Microdiscectomy;  Surgeon: Kristeen Miss, MD;  Location: [place];  Service: Neurosurgery;  Laterality: Right;  . POLYPECTOMY  12/30/2018   Procedure: POLYPECTOMY;  Surgeon: Danie Binder, MD;  [facility];  Service: Endoscopy;;  colon  . THORACENTESIS Right 08/08/2019   Procedure: Thoracentesis;  Surgeon: [name], MD;  Location: [place];  Service: Pulmonary;  Laterality: Right;  Marland Kitchen VIDEO BRONCHOSCOPY Right 08/08/2019   Procedure: Video  Bronchoscopy with Erbe Cryo Biopsy of Right mainstem;  Surgeon: [name], MD;  Location[facility];  Service: Pulmonary;  Laterality: Right;    FAMILY HISTORY:  Family History  Problem Relation Age of Onset  . Alcoholism Father   . CAD Brother   . Diabetes Brother   . Colon cancer Neg Hx   . Colon polyps Neg Hx   . Gastric cancer Neg Hx     SOCIAL HISTORY:  Social History   Tobacco Use  . Smoking status: Former Smoker    Packs/day: 0.50    Years: 50.00    Pack years: 25.00    Types: Cigarettes    Quit date: 12/26/1991    Years since quitting: 28.8  . Smokeless tobacco: Never Used  Vaping Use  . Vaping Use: Never used  Substance Use  Topics  . Alcohol use: Yes    Comment: occ beer  . Drug use: Never    ALLERGIES:  Allergies  Allergen Reactions  . Naproxen Sodium Hives, Swelling, Rash and Other (See Comments)    "Made the face, lips, and mouth swell- had to go to the E.R."  . Pembrolizumab Rash and Other (See Comments)    KEYTRUDA  . Nsaids Hives, Swelling and Rash    MEDICATIONS:  Current Outpatient Medications  Medication Sig Dispense Refill  . acetaminophen (TYLENOL) 325 MG tablet Take 2 tablets (650 mg total) by mouth every 6 (six) hours as needed for mild pain, fever or headache. (Patient taking differently: Take 325 mg by mouth every 4 (four) hours as needed for mild pain, fever or headache.) 120 tablet 0  . albuterol (VENTOLIN HFA) 108 (90 Base) MCG/ACT inhaler Inhale 2 puffs into the lungs See admin instructions. Inhale 2 puffs into the lungs in the morning, then every 6 hours as needed for shortness of breath or wheezing    . cyclobenzaprine (FLEXERIL) 10 MG tablet Take 10 mg by mouth at bedtime as needed for muscle spasms.    Marland Kitchen diltiazem (CARDIZEM CD) 360 MG 24 hr capsule TAKE 1 CAPSULE BY MOUTH EVERY DAY (Patient taking differently: Take 360 mg by mouth daily.) 30 capsule 6  . docusate sodium (COLACE) 100 MG capsule Take 100 mg by mouth at bedtime.    Marland Kitchen doxycycline (VIBRAMYCIN) 100 MG capsule Take 100 mg by mouth 2 (two) times daily.    . ferrous sulfate 325 (65 FE) MG tablet Take 325 mg by mouth daily with supper.    . folic acid (FOLVITE) 1 MG tablet Take 1 mg by mouth every evening.    . gabapentin (NEURONTIN) 300 MG capsule Take 600 mg by mouth at bedtime as needed (for neuropathy).    . meclizine (ANTIVERT) 25 MG tablet Take 25 mg by mouth 3 (three) times daily as needed for dizziness.    . metFORMIN (GLUCOPHAGE-XR) 500 MG 24 hr tablet Take 500-1,000 mg by mouth See admin instructions. Take 1,000 mg by mouth in the morning and 500 mg at bedtime    . methotrexate (RHEUMATREX) 2.5 MG tablet Take 10 mg  by mouth every Sunday. Caution:Chemotherapy. Protect from light.    . metoprolol tartrate (LOPRESSOR) 25 MG tablet Take 1 tablet (25 mg total) by mouth 2 (two) times daily. 180 tablet 3  . metroNIDAZOLE (METROCREAM) 0.75 % cream Apply 1 application topically 2 (two) times daily as needed (facial rosacea).     . mometasone (ELOCON) 0.1 % cream Apply 1 application topically 2 (two) times daily as needed (  psoriasis).     . Multiple Vitamins-Minerals (CENTRUM ADULTS PO) Take 1 tablet by mouth daily at 6 (six) AM.    . Omega-3 Fatty Acids (FISH OIL) 1000 MG CAPS Take 1,000 mg by mouth daily.     Marland Kitchen oxyCODONE-acetaminophen (PERCOCET/ROXICET) 5-325 MG tablet Take 1 tablet by mouth every 4 (four) hours as needed for severe pain.    . prednisoLONE acetate (PRED FORTE) 1 % ophthalmic suspension Place 1 drop into the left eye in the morning and at bedtime.    . tamsulosin (FLOMAX) 0.4 MG CAPS capsule Take 0.4 mg by mouth daily.    . traMADol (ULTRAM) 50 MG tablet Take 50 mg by mouth every 4 (four) hours as needed for moderate pain.    Marland Kitchen azithromycin (ZITHROMAX) 250 MG tablet Take 1 tablet by mouth daily for 3 more days (Patient not taking: Reported on 11/10/2020) 3 each 0   No current facility-administered medications for this encounter.    REVIEW OF SYSTEMS:  A 10+ POINT REVIEW OF SYSTEMS WAS OBTAINED including neurology, dermatology, psychiatry, cardiac, respiratory, lymph, extremities, GI, GU, musculoskeletal, constitutional, reproductive, HEENT.  Patient denies any significant cough.  He has had some intermittent pain in the left lower chest region.  He reports his breathing has been stable however his wife mention that he is little more short of breath.  Patient has significant problems with neuropathy which limits his ambulation.  He is unable to walk without assistance.  Patient has had significant problems with low back pain related to disc problems.  He completed a course of Decadron which is helped this  issue.   PHYSICAL EXAM:  height is '6\' 2"'  (1.88 m) and weight is 183 lb 2 oz (83.1 kg). His temporal temperature is 97 F (36.1 C) (abnormal). His blood pressure is 104/59 (abnormal) and his pulse is 101 (abnormal). His respiration is 20 and oxygen saturation is 94%.   General: Alert and oriented, in no acute distress, sitting comfortably in wheelchair HEENT: Head is normocephalic. Extraocular movements are intact.  Neck: Neck is supple, no palpable cervical or supraclavicular lymphadenopathy. Heart: Regular in rate and rhythm with no murmurs, rubs, or gallops. Chest: Clear to auscultation bilaterally, with no rhonchi, wheezes, or rales. Abdomen: Soft, nontender, nondistended, with no rigidity or guarding. Extremities: No cyanosis or edema. Lymphatics: see Neck Exam Skin: No concerning lesions. Musculoskeletal: symmetric strength and muscle tone throughout. Neurologic: Cranial nerves II through XII are grossly intact. No obvious focalities. Speech is fluent. Coordination is intact. Psychiatric: Judgment and insight are intact. Affect is appropriate.   ECOG = 2  0 - Asymptomatic (Fully active, able to carry on all predisease activities without restriction)  1 - Symptomatic but completely ambulatory (Restricted in physically strenuous activity but ambulatory and able to carry out work of a light or sedentary nature. For example, light housework, office work)  2 - Symptomatic, <50% in bed during the day (Ambulatory and capable of all self care but unable to carry out any work activities. Up and about more than 50% of waking hours)  3 - Symptomatic, >50% in bed, but not bedbound (Capable of only limited self-care, confined to bed or chair 50% or more of waking hours)  4 - Bedbound (Completely disabled. Cannot carry on any self-care. Totally confined to bed or chair)  5 - Death   Eustace Pen MM, Creech RH, Tormey DC, et al. [phone]). "Toxicity and response criteria of the [facility]". [place] Oncol. 5 (  6): 649-55  LABORATORY DATA:  Lab Results  Component Value Date   WBC 11.4 (H) 10/22/2020   HGB 8.7 (L) 10/22/2020   HCT 28.3 (L) 10/22/2020   MCV 103.7 (H) 10/22/2020   PLT 206 10/22/2020   NEUTROABS 8.3 (H) 10/21/2020   Lab Results  Component Value Date   NA 137 10/22/2020   K 4.1 10/22/2020   CL 105 10/22/2020   CO2 26 10/22/2020   GLUCOSE 152 (H) 10/22/2020   CREATININE 0.96 10/22/2020   CALCIUM 8.3 (L) 10/22/2020      RADIOGRAPHY: NM PET Image Restage (PS) Skull Base to Thigh  Result Date: 10/18/2020 CLINICAL DATA:  Subsequent treatment strategy for non-small cell cancer. Initial diagnosis 08/08/2019. Status post chemotherapy. Evaluate new/enlarging left lower lobe lesion. EXAM: NUCLEAR MEDICINE PET SKULL BASE TO THIGH TECHNIQUE: 9.8 mCi F-18 FDG was injected intravenously. Full-ring PET imaging was performed from the skull base to thigh after the radiotracer. CT data was obtained and used for attenuation correction and anatomic localization. Fasting blood glucose: 115 mg/dl COMPARISON:  Recent CT examination 10/04/2020 and prior PET-CT 09/08/2019 FINDINGS: Mediastinal blood pool activity: SUV max 2.23 Liver activity: SUV max NA NECK: No hypermetabolic lymph nodes in the neck. Incidental CT findings: none CHEST: The 5 cm cavitary lesion in the left lower lobe is hypermetabolic with SUV max of 47.82 and most consistent with a second primary lung neoplasm. Mildly hypermetabolic left hilar lymph nodes likely representing metastatic adenopathy. SUV max is 4.16. Extensive post treatment/radiation changes involving the right hemithorax with significant loss of volume, airspace consolidation and bronchiectasis. There is also a loculated pleural effusion with surrounding thickened pleura. Diffuse low level hypermetabolism throughout the right this likely post treatment change. SUV max is 4.51. Areas of recurrent tumor could not be excluded. No other  pulmonary lesions are identified. Scattered small mediastinal and hilar lymph nodes without hypermetabolism. Incidental CT findings: Stable underlying emphysematous changes and areas of pulmonary scarring. Stable atherosclerotic calcifications involving the aorta and coronary arteries. ABDOMEN/PELVIS: No PET-CT findings suspicious for abdominal/pelvic metastatic disease. No hepatic or adrenal gland lesions are identified. Incidental CT findings: Stable atherosclerotic calcifications involving the aorta, iliac arteries and branch vessels. Stable advanced sigmoid colon diverticulosis. SKELETON: No findings suspicious for osseous metastatic disease. Incidental CT findings: none IMPRESSION: 1. 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 2. Mildly hypermetabolic left hilar nodes suspicious for metastatic adenopathy. 3. Stable appearing post treatment changes involving the right hemithorax as detailed above. Low level hypermetabolism likely treatment effect but could not exclude residual neoplasm. 4. No findings suspicious for abdominal/pelvic metastatic disease or osseous metastatic disease. Electronically Signed   By: Marijo Sanes M.D.   On: 10/18/2020 09:52   CT Biopsy  Result Date: 10/21/2020 INDICATION: 75 year old male with a history of hypermetabolic left lower lobe lung mass EXAM: CT BIOPSY MEDICATIONS: None. ANESTHESIA/SEDATION: Moderate (conscious) sedation was employed during this procedure. A total of Versed 2.0 mg and Fentanyl 100 mcg was administered intravenously. Moderate Sedation Time: 14 minutes. The patient's level of consciousness and vital signs were monitored continuously by radiology nursing throughout the procedure under my direct supervision. FLUOROSCOPY TIME:  CT COMPLICATIONS: None PROCEDURE: The procedure, risks, benefits, and alternatives were explained to the patient and the patient's  family. Specific risks that were addressed included bleeding, infection, pneumothorax, need for further procedure including chest tube placement, chance of delayed pneumothorax or hemorrhage,  hemoptysis, nondiagnostic sample, cardiopulmonary collapse, death. Questions regarding the procedure were encouraged and answered. The patient understands and consents to the procedure. Patient was positioned in the left decubitus position on the CT gantry table and a scout CT of the chest was performed for planning purposes. Once angle of approach was determined, the skin and subcutaneous tissues this scan was prepped and draped in the usual sterile fashion, and a sterile drape was applied covering the operative field. A sterile gown and sterile gloves were used for the procedure. Local anesthesia was provided with 1% Lidocaine. The skin and subcutaneous tissues were infiltrated 1% lidocaine for local anesthesia, and a small stab incision was made with an 11 blade scalpel. Using CT guidance, a 17 gauge trocar needle was advanced into the left lower lobetarget. After confirmation of the tip, separate 18 gauge core biopsies were performed. These were placed into solution for transportation to the lab. Biosentry Device was deployed. A final CT image was performed. Patient tolerated the procedure well and remained hemodynamically stable throughout. No complications were encountered and no significant blood loss was encounter IMPRESSION: Status post CT-guided biopsy of left lower lobe of hypermetabolic mass. Signed, Dulcy Fanny. Dellia Nims, RPVI [facility] Radiology Electronically Signed   By: Corrie Mckusick D.O.   On: 10/21/2020 15:22   DG CHEST PORT 1 VIEW  Result Date: 10/22/2020 CLINICAL DATA:  75 year old male with shortness of breath and chest pain. Sepsis. Non-small cell lung cancer. Status post CT-guided biopsy of left lower lobe mass yesterday. EXAM: PORTABLE CHEST 1 VIEW  COMPARISON:  PET-CT 10/21/2020 and earlier. 10/15/2020. Chest radiographs FINDINGS: Portable AP upright view at 0626 hours. Lower lung volumes. Mildly rotated to the right. Stable cardiac size and mediastinal contours. Visualized tracheal air column is within normal limits. No pneumothorax or pulmonary edema. Grossly stable superior segment left lower lobe mass, which was biopsied with CT guidance yesterday. 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 No new pulmonary opacity. Negative visible bowel gas pattern. Stable visualized osseous structures. IMPRESSION: 1. No pneumothorax, and grossly stable left lower lobe superior segment mass which was biopsied with CT guidance yesterday. 2. Stable right lung opacity thought related to post treatment/radiation change with fibrothorax. Electronically Signed   By: Genevie Ann M.D.   On: 10/22/2020 06:39   DG Chest Port 1 View  Result Date: 10/21/2020 CLINICAL DATA:  Sepsis. EXAM: PORTABLE CHEST 1 VIEW COMPARISON:  10/21/2020, earlier same day FINDINGS: 1511 hours. Persistent volume loss right hemithorax with shift of cardiomediastinal anatomy to the right. Right base atelectasis/infiltrate with right pleural effusion is similar to prior. Mass lesion left base again noted. No evidence for pneumothorax. Telemetry leads overlie the chest. IMPRESSION: No substantial interval change. Electronically Signed   By: Misty Stanley M.D.   On: 10/21/2020 17:21   DG Chest Port 1 View  Result Date: 10/21/2020 CLINICAL DATA:  Pain following biopsy EXAM: PORTABLE CHEST 1 VIEW COMPARISON:  PET-CT Oct 15, 2020 FINDINGS: No pneumothorax. Mass left lower lobe measuring 7.6 x 7.4 cm. Borders of this mass or ill-defined, potentially representing peripheral hemorrhage. Previously noted cavitation not well seen by radiography in this area. There is persistent airspace opacity in the right lower lobe with volume loss. Small right  pleural effusion. Heart upper normal in size with pulmonary vascularity normal. No adenopathy. No bone lesions. IMPRESSION: No pneumothorax. Question peripheral hemorrhage surrounding left lower lobe mass. This mass measures  7.6 x 7.4 cm. Persistent infiltrate with consolidation and volume loss right lower lobe. Small right pleural effusion. Stable cardiac silhouette. Electronically Signed   By: Lowella Grip III M.D.   On: 10/21/2020 15:04      IMPRESSION:Stage IIIC/IV (T4, N2, M0/M1a)non-small cell lung cancer, squamous cell carcinoma with potential malignant right pleural effusion, now with metastatic spread to the left lower lung or a new primary lung cancer Stage IIIa  (T3, N1, M0) squamous cell carcinoma   I discussed the pathologic and radiologic findings of this new issue in the left lower lung.  Based on imaging studies this appears to be a new primary lung cancer,  same histology as his previous right lung cancer that being squamous cell carcinoma.  Recent PET scan shows no active disease in the right lung or other parts of the body.  This PET scan does show activity in the large left lower lung mass as well as probable spread to the left hilar region.  I discussed 2 general radiation approaches, one being of curative intent assuming that this is a new diagnosis of lung cancer.  The patient has met with Dr. Julien Nordmann and he tolerated chemotherapy poorly  with his original lung cancer and this would not be an option for him.  In addition he tolerated immunotherapy poorly with significant rash.  His overall performance status is declined significantly over the past year in addition.  I am unsure whether he would be able to tolerate a curative course of radiation therapy which would encompass the left lower lung mass as well as the associated hilar metastasis extending over 6 and half weeks.  The other option to consider him to be more of a palliative approach to radiation therapy that being over  approximately 2 to 3 weeks just directed at the large left lower lung mass.  Patient would like to proceed with radiation therapy but is unsure whether he would like to proceed with the curative intent approach or more of a palliative approach given his overall performance status.  We discussed the general course of radiation therapy associated side effects and potential toxicities.  Patient understands that his breathing could potentially worsen with additional radiation therapy to the chest region.  He also understands that if his tumor progresses his breathing will likely become worse with further enlargement of this tumor.  PLAN: The patient will return for CT simulation tomorrow with treatments to begin in approximately a week.  He will discuss further radiation treatment options with his wife and let me know tomorrow which treatment approach they would like to proceed with that being a curative approach or more palliative approach.   45 minutes of total time was spent for this patient encounter, including preparation, face-to-face counseling with the patient and coordination of care, physical exam, and documentation of the encounter.   ------------------------------------------------  Blair Promise, PhD, MD  This document serves as a record of services personally performed by Gery Pray, MD. It was created on his behalf by Roney Mans, a trained medical scribe. The creation of this record is based on the scribe's personal observations and the provider's statements to them. This document has been checked and approved by the attending provider.

## 2020-11-10 ENCOUNTER — Ambulatory Visit
Admission: RE | Admit: 2020-11-10 | Discharge: 2020-11-10 | Disposition: A | Discharge status: Home / Self Care | Payer: Medicare Other | Source: Ambulatory Visit | Attending: Radiation Oncology | Admitting: Radiation Oncology

## 2020-11-10 ENCOUNTER — Encounter: Payer: Self-pay | Admitting: Radiation Oncology

## 2020-11-10 ENCOUNTER — Other Ambulatory Visit: Payer: Self-pay

## 2020-11-10 ENCOUNTER — Ambulatory Visit
Admission: RE | Admit: 2020-11-10 | Discharge: 2020-11-10 | Disposition: A | Discharge status: Home / Self Care | Payer: Medicare Other | Source: Ambulatory Visit | Attending: Physician Assistant | Admitting: Physician Assistant

## 2020-11-10 VITALS — BP 104/59 | HR 101 | Temp 97.0°F | Resp 20 | Ht 74.0 in | Wt 183.1 lb

## 2020-11-10 DIAGNOSIS — Z923 Personal history of irradiation: Secondary | ICD-10-CM | POA: Insufficient documentation

## 2020-11-10 DIAGNOSIS — J90 Pleural effusion, not elsewhere classified: Secondary | ICD-10-CM | POA: Insufficient documentation

## 2020-11-10 DIAGNOSIS — C3432 Malignant neoplasm of lower lobe, left bronchus or lung: Secondary | ICD-10-CM | POA: Diagnosis not present

## 2020-11-10 DIAGNOSIS — Z7984 Long term (current) use of oral hypoglycemic drugs: Secondary | ICD-10-CM | POA: Insufficient documentation

## 2020-11-10 DIAGNOSIS — Z9221 Personal history of antineoplastic chemotherapy: Secondary | ICD-10-CM | POA: Insufficient documentation

## 2020-11-10 DIAGNOSIS — E119 Type 2 diabetes mellitus without complications: Secondary | ICD-10-CM | POA: Diagnosis not present

## 2020-11-10 DIAGNOSIS — Z87891 Personal history of nicotine dependence: Secondary | ICD-10-CM | POA: Insufficient documentation

## 2020-11-10 DIAGNOSIS — C3491 Malignant neoplasm of unspecified part of right bronchus or lung: Secondary | ICD-10-CM

## 2020-11-10 DIAGNOSIS — L4050 Arthropathic psoriasis, unspecified: Secondary | ICD-10-CM | POA: Insufficient documentation

## 2020-11-10 DIAGNOSIS — E785 Hyperlipidemia, unspecified: Secondary | ICD-10-CM | POA: Insufficient documentation

## 2020-11-10 DIAGNOSIS — Z79899 Other long term (current) drug therapy: Secondary | ICD-10-CM | POA: Diagnosis not present

## 2020-11-10 DIAGNOSIS — J449 Chronic obstructive pulmonary disease, unspecified: Secondary | ICD-10-CM | POA: Diagnosis not present

## 2020-11-10 DIAGNOSIS — I10 Essential (primary) hypertension: Secondary | ICD-10-CM | POA: Diagnosis not present

## 2020-11-10 HISTORY — DX: Personal history of irradiation: Z92.3

## 2020-11-10 NOTE — Progress Notes (Signed)
See MD note for nursing evaluation. °

## 2020-11-11 ENCOUNTER — Other Ambulatory Visit: Payer: Self-pay

## 2020-11-11 ENCOUNTER — Ambulatory Visit
Admission: RE | Admit: 2020-11-11 | Discharge: 2020-11-11 | Disposition: A | Discharge status: Home / Self Care | Payer: Medicare Other | Source: Ambulatory Visit | Attending: Radiation Oncology | Admitting: Radiation Oncology

## 2020-11-11 DIAGNOSIS — C3491 Malignant neoplasm of unspecified part of right bronchus or lung: Secondary | ICD-10-CM

## 2020-11-11 DIAGNOSIS — Z510 Encounter for antineoplastic radiation therapy: Secondary | ICD-10-CM | POA: Insufficient documentation

## 2020-11-11 DIAGNOSIS — C3432 Malignant neoplasm of lower lobe, left bronchus or lung: Secondary | ICD-10-CM | POA: Insufficient documentation

## 2020-11-11 DIAGNOSIS — Z87891 Personal history of nicotine dependence: Secondary | ICD-10-CM | POA: Insufficient documentation

## 2020-11-18 DIAGNOSIS — Z87891 Personal history of nicotine dependence: Secondary | ICD-10-CM | POA: Diagnosis not present

## 2020-11-18 DIAGNOSIS — Z510 Encounter for antineoplastic radiation therapy: Secondary | ICD-10-CM | POA: Diagnosis not present

## 2020-11-18 DIAGNOSIS — C3432 Malignant neoplasm of lower lobe, left bronchus or lung: Secondary | ICD-10-CM | POA: Diagnosis not present

## 2020-11-21 DIAGNOSIS — I700 Atherosclerosis of aorta: Secondary | ICD-10-CM | POA: Diagnosis not present

## 2020-11-21 DIAGNOSIS — L4050 Arthropathic psoriasis, unspecified: Secondary | ICD-10-CM | POA: Diagnosis not present

## 2020-11-21 DIAGNOSIS — Z6823 Body mass index (BMI) 23.0-23.9, adult: Secondary | ICD-10-CM | POA: Diagnosis not present

## 2020-11-21 DIAGNOSIS — C3490 Malignant neoplasm of unspecified part of unspecified bronchus or lung: Secondary | ICD-10-CM | POA: Diagnosis not present

## 2020-11-21 DIAGNOSIS — R6510 Systemic inflammatory response syndrome (SIRS) of non-infectious origin without acute organ dysfunction: Secondary | ICD-10-CM | POA: Diagnosis not present

## 2020-11-22 ENCOUNTER — Other Ambulatory Visit: Payer: Self-pay

## 2020-11-22 ENCOUNTER — Ambulatory Visit
Admission: RE | Admit: 2020-11-22 | Discharge: 2020-11-22 | Disposition: A | Discharge status: Home / Self Care | Payer: Medicare Other | Source: Ambulatory Visit | Attending: Radiation Oncology | Admitting: Radiation Oncology

## 2020-11-22 DIAGNOSIS — C3432 Malignant neoplasm of lower lobe, left bronchus or lung: Secondary | ICD-10-CM | POA: Diagnosis not present

## 2020-11-22 DIAGNOSIS — Z510 Encounter for antineoplastic radiation therapy: Secondary | ICD-10-CM | POA: Diagnosis not present

## 2020-11-22 DIAGNOSIS — C3491 Malignant neoplasm of unspecified part of right bronchus or lung: Secondary | ICD-10-CM

## 2020-11-22 DIAGNOSIS — Z87891 Personal history of nicotine dependence: Secondary | ICD-10-CM | POA: Diagnosis not present

## 2020-11-22 MED ORDER — SONAFINE EX EMUL
1.0000 "application " | Freq: Once | CUTANEOUS | Status: AC
  Administered 2020-11-22: 1 via TOPICAL

## 2020-11-23 ENCOUNTER — Other Ambulatory Visit: Payer: Self-pay | Admitting: Radiation Oncology

## 2020-11-23 ENCOUNTER — Ambulatory Visit
Admission: RE | Admit: 2020-11-23 | Discharge: 2020-11-23 | Disposition: A | Discharge status: Home / Self Care | Payer: Medicare Other | Source: Ambulatory Visit | Attending: Radiation Oncology | Admitting: Radiation Oncology

## 2020-11-23 ENCOUNTER — Other Ambulatory Visit: Payer: Self-pay | Admitting: Radiology

## 2020-11-23 VITALS — BP 111/95 | HR 102 | Temp 97.5°F | Resp 20

## 2020-11-23 DIAGNOSIS — C3491 Malignant neoplasm of unspecified part of right bronchus or lung: Secondary | ICD-10-CM

## 2020-11-23 DIAGNOSIS — Z87891 Personal history of nicotine dependence: Secondary | ICD-10-CM | POA: Diagnosis not present

## 2020-11-23 DIAGNOSIS — C3432 Malignant neoplasm of lower lobe, left bronchus or lung: Secondary | ICD-10-CM | POA: Diagnosis not present

## 2020-11-23 DIAGNOSIS — I959 Hypotension, unspecified: Secondary | ICD-10-CM

## 2020-11-23 DIAGNOSIS — Z510 Encounter for antineoplastic radiation therapy: Secondary | ICD-10-CM | POA: Diagnosis not present

## 2020-11-23 LAB — BASIC METABOLIC PANEL - CANCER CENTER ONLY
Anion gap: 10 (ref 5–15)
BUN: 25 mg/dL — ABNORMAL HIGH (ref 8–23)
CO2: 27 mmol/L (ref 22–32)
Calcium: 9.9 mg/dL (ref 8.9–10.3)
Chloride: 101 mmol/L (ref 98–111)
Creatinine: 1.19 mg/dL (ref 0.61–1.24)
GFR, Estimated: >60 mL/min (ref >60)
Glucose, Bld: 136 mg/dL — ABNORMAL HIGH (ref 70–99)
Potassium: 4.4 mmol/L (ref 3.5–5.1)
Sodium: 138 mmol/L (ref 135–145)

## 2020-11-23 MED ORDER — SODIUM CHLORIDE 0.9 % IV SOLN
INTRAVENOUS | Status: AC
  Filled 2020-11-23: qty 250

## 2020-11-23 MED ORDER — SODIUM CHLORIDE 0.9 % IV SOLN
Freq: Once | INTRAVENOUS | Status: AC
  Filled 2020-11-23: qty 250

## 2020-11-24 ENCOUNTER — Telehealth: Payer: Self-pay | Admitting: Physician Assistant

## 2020-11-24 ENCOUNTER — Ambulatory Visit
Admission: RE | Admit: 2020-11-24 | Discharge: 2020-11-24 | Disposition: A | Discharge status: Home / Self Care | Payer: Medicare Other | Source: Ambulatory Visit | Attending: Radiation Oncology | Admitting: Radiation Oncology

## 2020-11-24 DIAGNOSIS — C3432 Malignant neoplasm of lower lobe, left bronchus or lung: Secondary | ICD-10-CM | POA: Diagnosis not present

## 2020-11-24 DIAGNOSIS — Z510 Encounter for antineoplastic radiation therapy: Secondary | ICD-10-CM | POA: Diagnosis not present

## 2020-11-24 DIAGNOSIS — Z87891 Personal history of nicotine dependence: Secondary | ICD-10-CM | POA: Diagnosis not present

## 2020-11-24 NOTE — Telephone Encounter (Signed)
Scheduled appt per 6/21 sch msg. Pt's wife is aware.

## 2020-11-25 ENCOUNTER — Other Ambulatory Visit: Payer: Self-pay

## 2020-11-25 ENCOUNTER — Ambulatory Visit
Admission: RE | Admit: 2020-11-25 | Discharge: 2020-11-25 | Disposition: A | Discharge status: Home / Self Care | Payer: Medicare Other | Source: Ambulatory Visit | Attending: Radiation Oncology | Admitting: Radiation Oncology

## 2020-11-25 DIAGNOSIS — Z510 Encounter for antineoplastic radiation therapy: Secondary | ICD-10-CM | POA: Diagnosis not present

## 2020-11-25 DIAGNOSIS — C3432 Malignant neoplasm of lower lobe, left bronchus or lung: Secondary | ICD-10-CM | POA: Diagnosis not present

## 2020-11-25 DIAGNOSIS — Z87891 Personal history of nicotine dependence: Secondary | ICD-10-CM | POA: Diagnosis not present

## 2020-11-26 ENCOUNTER — Ambulatory Visit: Payer: Medicare Other

## 2020-11-29 ENCOUNTER — Ambulatory Visit: Payer: Medicare Other

## 2020-11-30 ENCOUNTER — Ambulatory Visit: Payer: Medicare Other

## 2020-11-30 ENCOUNTER — Encounter (HOSPITAL_COMMUNITY): Payer: Self-pay

## 2020-11-30 ENCOUNTER — Inpatient Hospital Stay (HOSPITAL_COMMUNITY)
Admission: EM | Admit: 2020-11-30 | Discharge: 2020-12-08 | DRG: 871 | Disposition: A | Discharge status: Skilled Nursing Facility | Payer: Medicare Other | Attending: Internal Medicine | Admitting: Internal Medicine

## 2020-11-30 ENCOUNTER — Other Ambulatory Visit: Payer: Self-pay

## 2020-11-30 ENCOUNTER — Emergency Department (HOSPITAL_COMMUNITY): Payer: Medicare Other

## 2020-11-30 DIAGNOSIS — Z888 Allergy status to other drugs, medicaments and biological substances status: Secondary | ICD-10-CM

## 2020-11-30 DIAGNOSIS — C3491 Malignant neoplasm of unspecified part of right bronchus or lung: Secondary | ICD-10-CM | POA: Diagnosis not present

## 2020-11-30 DIAGNOSIS — J189 Pneumonia, unspecified organism: Secondary | ICD-10-CM | POA: Diagnosis not present

## 2020-11-30 DIAGNOSIS — Z7984 Long term (current) use of oral hypoglycemic drugs: Secondary | ICD-10-CM

## 2020-11-30 DIAGNOSIS — L4050 Arthropathic psoriasis, unspecified: Secondary | ICD-10-CM | POA: Diagnosis not present

## 2020-11-30 DIAGNOSIS — Y95 Nosocomial condition: Secondary | ICD-10-CM | POA: Diagnosis present

## 2020-11-30 DIAGNOSIS — Z811 Family history of alcohol abuse and dependence: Secondary | ICD-10-CM

## 2020-11-30 DIAGNOSIS — R2689 Other abnormalities of gait and mobility: Secondary | ICD-10-CM | POA: Diagnosis not present

## 2020-11-30 DIAGNOSIS — Z8249 Family history of ischemic heart disease and other diseases of the circulatory system: Secondary | ICD-10-CM | POA: Diagnosis not present

## 2020-11-30 DIAGNOSIS — R531 Weakness: Secondary | ICD-10-CM

## 2020-11-30 DIAGNOSIS — J9611 Chronic respiratory failure with hypoxia: Secondary | ICD-10-CM | POA: Diagnosis not present

## 2020-11-30 DIAGNOSIS — J90 Pleural effusion, not elsewhere classified: Secondary | ICD-10-CM | POA: Diagnosis not present

## 2020-11-30 DIAGNOSIS — Z886 Allergy status to analgesic agent status: Secondary | ICD-10-CM

## 2020-11-30 DIAGNOSIS — Z923 Personal history of irradiation: Secondary | ICD-10-CM | POA: Diagnosis not present

## 2020-11-30 DIAGNOSIS — R1310 Dysphagia, unspecified: Secondary | ICD-10-CM | POA: Diagnosis not present

## 2020-11-30 DIAGNOSIS — E119 Type 2 diabetes mellitus without complications: Secondary | ICD-10-CM | POA: Diagnosis not present

## 2020-11-30 DIAGNOSIS — D649 Anemia, unspecified: Secondary | ICD-10-CM

## 2020-11-30 DIAGNOSIS — Z87891 Personal history of nicotine dependence: Secondary | ICD-10-CM

## 2020-11-30 DIAGNOSIS — E876 Hypokalemia: Secondary | ICD-10-CM | POA: Diagnosis present

## 2020-11-30 DIAGNOSIS — M6281 Muscle weakness (generalized): Secondary | ICD-10-CM | POA: Diagnosis not present

## 2020-11-30 DIAGNOSIS — I10 Essential (primary) hypertension: Secondary | ICD-10-CM | POA: Diagnosis not present

## 2020-11-30 DIAGNOSIS — R509 Fever, unspecified: Secondary | ICD-10-CM | POA: Diagnosis not present

## 2020-11-30 DIAGNOSIS — Z743 Need for continuous supervision: Secondary | ICD-10-CM | POA: Diagnosis not present

## 2020-11-30 DIAGNOSIS — R6520 Severe sepsis without septic shock: Secondary | ICD-10-CM | POA: Diagnosis present

## 2020-11-30 DIAGNOSIS — E1140 Type 2 diabetes mellitus with diabetic neuropathy, unspecified: Secondary | ICD-10-CM | POA: Diagnosis not present

## 2020-11-30 DIAGNOSIS — Z20822 Contact with and (suspected) exposure to covid-19: Secondary | ICD-10-CM | POA: Diagnosis not present

## 2020-11-30 DIAGNOSIS — Z6372 Alcoholism and drug addiction in family: Secondary | ICD-10-CM

## 2020-11-30 DIAGNOSIS — A419 Sepsis, unspecified organism: Secondary | ICD-10-CM | POA: Diagnosis not present

## 2020-11-30 DIAGNOSIS — C3432 Malignant neoplasm of lower lobe, left bronchus or lung: Secondary | ICD-10-CM | POA: Diagnosis not present

## 2020-11-30 DIAGNOSIS — I480 Paroxysmal atrial fibrillation: Secondary | ICD-10-CM | POA: Diagnosis present

## 2020-11-30 DIAGNOSIS — J440 Chronic obstructive pulmonary disease with acute lower respiratory infection: Secondary | ICD-10-CM | POA: Diagnosis present

## 2020-11-30 DIAGNOSIS — E872 Acidosis: Secondary | ICD-10-CM | POA: Diagnosis not present

## 2020-11-30 DIAGNOSIS — Z79899 Other long term (current) drug therapy: Secondary | ICD-10-CM

## 2020-11-30 DIAGNOSIS — Z9981 Dependence on supplemental oxygen: Secondary | ICD-10-CM

## 2020-11-30 DIAGNOSIS — I313 Pericardial effusion (noninflammatory): Secondary | ICD-10-CM | POA: Diagnosis not present

## 2020-11-30 DIAGNOSIS — R41841 Cognitive communication deficit: Secondary | ICD-10-CM | POA: Diagnosis not present

## 2020-11-30 DIAGNOSIS — Z833 Family history of diabetes mellitus: Secondary | ICD-10-CM

## 2020-11-30 DIAGNOSIS — I4891 Unspecified atrial fibrillation: Secondary | ICD-10-CM | POA: Diagnosis not present

## 2020-11-30 DIAGNOSIS — E785 Hyperlipidemia, unspecified: Secondary | ICD-10-CM | POA: Diagnosis present

## 2020-11-30 DIAGNOSIS — D630 Anemia in neoplastic disease: Secondary | ICD-10-CM | POA: Diagnosis present

## 2020-11-30 DIAGNOSIS — Z66 Do not resuscitate: Secondary | ICD-10-CM | POA: Diagnosis not present

## 2020-11-30 DIAGNOSIS — L0390 Cellulitis, unspecified: Secondary | ICD-10-CM | POA: Diagnosis not present

## 2020-11-30 DIAGNOSIS — I700 Atherosclerosis of aorta: Secondary | ICD-10-CM | POA: Diagnosis not present

## 2020-11-30 LAB — URINALYSIS, ROUTINE W REFLEX MICROSCOPIC
Bilirubin Urine: NEGATIVE
Glucose, UA: NEGATIVE mg/dL
Hgb urine dipstick: NEGATIVE
Ketones, ur: NEGATIVE mg/dL
Leukocytes,Ua: NEGATIVE
Nitrite: NEGATIVE
Protein, ur: 30 mg/dL — AB
Specific Gravity, Urine: 1.018 (ref 1.005–1.030)
pH: 5 (ref 5.0–8.0)

## 2020-11-30 LAB — CBC WITH DIFFERENTIAL/PLATELET
Abs Immature Granulocytes: 0.45 10*3/uL — ABNORMAL HIGH (ref 0.00–0.07)
Basophils Absolute: 0.1 10*3/uL (ref 0.0–0.1)
Basophils Relative: 0 %
Eosinophils Absolute: 0.3 10*3/uL (ref 0.0–0.5)
Eosinophils Relative: 1 %
HCT: 21.9 % — ABNORMAL LOW (ref 39.0–52.0)
Hemoglobin: 6.6 g/dL — CL (ref 13.0–17.0)
Immature Granulocytes: 2 %
Lymphocytes Relative: 3 %
Lymphs Abs: 0.9 10*3/uL (ref 0.7–4.0)
MCH: 31.6 pg (ref 26.0–34.0)
MCHC: 30.1 g/dL (ref 30.0–36.0)
MCV: 104.8 fL — ABNORMAL HIGH (ref 80.0–100.0)
Monocytes Absolute: 0.4 10*3/uL (ref 0.1–1.0)
Monocytes Relative: 1 %
Neutro Abs: 26.4 10*3/uL — ABNORMAL HIGH (ref 1.7–7.7)
Neutrophils Relative %: 93 %
Platelets: 374 10*3/uL (ref 150–400)
RBC: 2.09 MIL/uL — ABNORMAL LOW (ref 4.22–5.81)
RDW: 17.2 % — ABNORMAL HIGH (ref 11.5–15.5)
WBC: 28.6 10*3/uL — ABNORMAL HIGH (ref 4.0–10.5)
nRBC: 0 % (ref 0.0–0.2)

## 2020-11-30 LAB — LACTIC ACID, PLASMA
Lactic Acid, Venous: 5 mmol/L (ref 0.5–1.9)
Lactic Acid, Venous: 1.1 mmol/L (ref 0.5–1.9)

## 2020-11-30 LAB — COMPREHENSIVE METABOLIC PANEL
ALT: 19 U/L (ref 0–44)
AST: 18 U/L (ref 15–41)
Albumin: 2.4 g/dL — ABNORMAL LOW (ref 3.5–5.0)
Alkaline Phosphatase: 72 U/L (ref 38–126)
Anion gap: 12 (ref 5–15)
BUN: 28 mg/dL — ABNORMAL HIGH (ref 8–23)
CO2: 27 mmol/L (ref 22–32)
Calcium: 9.6 mg/dL (ref 8.9–10.3)
Chloride: 101 mmol/L (ref 98–111)
Creatinine, Ser: 1.19 mg/dL (ref 0.61–1.24)
GFR, Estimated: >60 mL/min (ref >60)
Glucose, Bld: 168 mg/dL — ABNORMAL HIGH (ref 70–99)
Potassium: 4.9 mmol/L (ref 3.5–5.1)
Sodium: 140 mmol/L (ref 135–145)
Total Bilirubin: 0.4 mg/dL (ref 0.3–1.2)
Total Protein: 7 g/dL (ref 6.5–8.1)

## 2020-11-30 LAB — RESP PANEL BY RT-PCR (FLU A&B, COVID) ARPGX2
Influenza A by PCR: NEGATIVE
Influenza B by PCR: NEGATIVE
SARS Coronavirus 2 by RT PCR: NEGATIVE

## 2020-11-30 LAB — APTT: aPTT: 34 seconds (ref 24–36)

## 2020-11-30 LAB — PREPARE RBC (CROSSMATCH)

## 2020-11-30 LAB — GLUCOSE, CAPILLARY: Glucose-Capillary: 176 mg/dL — ABNORMAL HIGH (ref 70–99)

## 2020-11-30 LAB — PROTIME-INR
INR: 1.1 (ref 0.8–1.2)
Prothrombin Time: 14.1 seconds (ref 11.4–15.2)

## 2020-11-30 MED ORDER — METRONIDAZOLE 500 MG/100ML IV SOLN
500.0000 mg | Freq: Three times a day (TID) | INTRAVENOUS | Status: DC
Start: 2020-11-30 — End: 2020-12-01
  Administered 2020-11-30 – 2020-12-01 (×2): 500 mg via INTRAVENOUS
  Filled 2020-11-30 (×3): qty 100

## 2020-11-30 MED ORDER — ACETAMINOPHEN 650 MG RE SUPP: 650.0000 mg | Freq: Four times a day (QID) | RECTAL | Status: DC | PRN

## 2020-11-30 MED ORDER — ALBUTEROL SULFATE (2.5 MG/3ML) 0.083% IN NEBU
2.5000 mg | INHALATION_SOLUTION | RESPIRATORY_TRACT | Status: DC | PRN
  Administered 2020-11-30 – 2020-12-02 (×3): 2.5 mg via RESPIRATORY_TRACT
  Filled 2020-11-30 (×3): qty 3

## 2020-11-30 MED ORDER — INSULIN ASPART 100 UNIT/ML IJ SOLN
0.0000 [IU] | Freq: Three times a day (TID) | INTRAMUSCULAR | Status: DC
  Administered 2020-12-01 – 2020-12-02 (×3): 2 [IU] via SUBCUTANEOUS
  Administered 2020-12-02 – 2020-12-03 (×3): 1 [IU] via SUBCUTANEOUS
  Administered 2020-12-03: 2 [IU] via SUBCUTANEOUS
  Administered 2020-12-04: 5 [IU] via SUBCUTANEOUS
  Administered 2020-12-04: 2 [IU] via SUBCUTANEOUS
  Administered 2020-12-05: 1 [IU] via SUBCUTANEOUS
  Administered 2020-12-05: 3 [IU] via SUBCUTANEOUS
  Administered 2020-12-05 – 2020-12-06 (×2): 1 [IU] via SUBCUTANEOUS
  Administered 2020-12-06: 3 [IU] via SUBCUTANEOUS
  Administered 2020-12-07: 1 [IU] via SUBCUTANEOUS
  Administered 2020-12-07: 3 [IU] via SUBCUTANEOUS
  Administered 2020-12-07: 1 [IU] via SUBCUTANEOUS
  Administered 2020-12-08: 2 [IU] via SUBCUTANEOUS
  Filled 2020-11-30: qty 0.09

## 2020-11-30 MED ORDER — ACETAMINOPHEN 500 MG PO TABS
1000.0000 mg | ORAL_TABLET | Freq: Once | ORAL | Status: AC
  Administered 2020-11-30: 1000 mg via ORAL
  Filled 2020-11-30: qty 2

## 2020-11-30 MED ORDER — ONDANSETRON HCL 4 MG/2ML IJ SOLN: 4.0000 mg | Freq: Four times a day (QID) | INTRAMUSCULAR | Status: DC | PRN

## 2020-11-30 MED ORDER — SODIUM CHLORIDE 0.9% FLUSH
3.0000 mL | Freq: Two times a day (BID) | INTRAVENOUS | Status: DC
  Administered 2020-11-30 – 2020-12-08 (×11): 3 mL via INTRAVENOUS

## 2020-11-30 MED ORDER — ACETAMINOPHEN 325 MG PO TABS
650.0000 mg | ORAL_TABLET | Freq: Four times a day (QID) | ORAL | Status: DC | PRN
  Administered 2020-12-01 – 2020-12-04 (×4): 650 mg via ORAL
  Filled 2020-11-30 (×4): qty 2

## 2020-11-30 MED ORDER — GUAIFENESIN ER 600 MG PO TB12
600.0000 mg | ORAL_TABLET | Freq: Two times a day (BID) | ORAL | Status: DC
  Administered 2020-11-30 – 2020-12-08 (×16): 600 mg via ORAL
  Filled 2020-11-30 (×16): qty 1

## 2020-11-30 MED ORDER — VANCOMYCIN HCL 1750 MG/350ML IV SOLN
1750.0000 mg | Freq: Once | INTRAVENOUS | Status: AC
  Administered 2020-11-30: 1750 mg via INTRAVENOUS
  Filled 2020-11-30: qty 350

## 2020-11-30 MED ORDER — SODIUM CHLORIDE 0.9 % IV BOLUS
500.0000 mL | Freq: Once | INTRAVENOUS | Status: AC
  Administered 2020-11-30: 500 mL via INTRAVENOUS

## 2020-11-30 MED ORDER — SODIUM CHLORIDE 0.9 % IV SOLN: 10.0000 mL/h | Freq: Once | INTRAVENOUS | Status: DC

## 2020-11-30 MED ORDER — SODIUM CHLORIDE 0.9 % IV SOLN
2.0000 g | Freq: Once | INTRAVENOUS | Status: AC
  Administered 2020-11-30: 2 g via INTRAVENOUS
  Filled 2020-11-30: qty 2

## 2020-11-30 MED ORDER — SODIUM CHLORIDE 0.9 % IV BOLUS
1000.0000 mL | Freq: Once | INTRAVENOUS | Status: AC
  Administered 2020-11-30: 1000 mL via INTRAVENOUS

## 2020-11-30 MED ORDER — LACTATED RINGERS IV SOLN: INTRAVENOUS | Status: AC

## 2020-11-30 MED ORDER — VANCOMYCIN HCL 1500 MG/300ML IV SOLN
1500.0000 mg | INTRAVENOUS | Status: DC
  Administered 2020-12-01: 1500 mg via INTRAVENOUS
  Filled 2020-11-30: qty 300

## 2020-11-30 MED ORDER — SODIUM CHLORIDE 0.9 % IV SOLN
2.0000 g | Freq: Three times a day (TID) | INTRAVENOUS | Status: AC
  Administered 2020-11-30 – 2020-12-07 (×22): 2 g via INTRAVENOUS
  Filled 2020-11-30 (×23): qty 2

## 2020-11-30 MED ORDER — ONDANSETRON HCL 4 MG PO TABS: 4.0000 mg | ORAL_TABLET | Freq: Four times a day (QID) | ORAL | Status: DC | PRN

## 2020-11-30 NOTE — ED Triage Notes (Signed)
Pt c/o increasing weakness, loss of appetite, mid back pain and fevers at night increasing x 1 month.  Pain score 10/10.  Pt reports starting radiation last week.  Hx of lung CA.      Wife reports symptoms started after lung biopsy 5/19.

## 2020-11-30 NOTE — ED Notes (Signed)
Date and time results received: 11/30/20 5:30 PM  (use smartphrase ".now" to insert current time)  Test: Hgb Critical Value: 6.6  Name of Provider Notified: Francia Greaves, MD  Orders Received? Or Actions Taken?: see chart

## 2020-11-30 NOTE — Progress Notes (Signed)
A consult was received from an ED physician for vanc/cefepime per pharmacy dosing.  The patient's profile has been reviewed for ht/wt/allergies/indication/available labs.   A one time order has been placed for vanc 1750mg  and cefepime 2g.  Further antibiotics/pharmacy consults should be ordered by admitting physician if indicated.                       Thank you, Kara Mead 11/30/2020  4:25 PM

## 2020-11-30 NOTE — ED Notes (Signed)
Pts becoming hypotensive. MD Posey Pronto made aware and at bedside.

## 2020-11-30 NOTE — ED Provider Notes (Signed)
Emergency Medicine Provider Triage Evaluation Note  Jeremy [name] , a 75 y.o. male  was evaluated in triage.  Pt complains of weakness, fever.  Patient and his wife at bedside report that the patient has a history of lung cancer, currently undergoing treatment.  He had a biopsy done back in March, and has been having nighttime fevers.  He also has a history of sepsis.  Sent here for evaluation by his oncologist.  He does endorse a cough and shortness of breath.  No chest pain.  Review of Systems  Positive: As above Negative:   Physical Exam  BP 104/65 (BP Location: Left Arm)   Pulse (!) 113   Temp 97.8 F (36.6 C) (Oral)   Resp (!) 22   SpO2 95%  Gen:   Awake, ill-appearing Resp:  Mildly tachypneic MSK:   Moves extremities without difficulty  Other:    Medical Decision Making  Medically screening exam initiated at 3:38 PM.  Appropriate orders placed.  Haze Rushing was informed that the remainder of the evaluation will be completed by another provider, this initial triage assessment does not replace that evaluation, and the importance of remaining in the ED until their evaluation is complete.  Staff informed to expedite triage.  Sepsis order set initiated   Garald Balding, PA-C 11/30/20 1541    Lucrezia Starch, MD 12/01/20 [phone]

## 2020-11-30 NOTE — H&P (Signed)
History and Physical    TAHJE BORAWSKI ZRA:[id number] DOB: 06/04/1946 DOA: 11/30/2020  PCP: [name], MD  Patient coming from: Home  I have personally briefly reviewed patient's old medical records in [place]  Chief Complaint: Fevers, weakness  HPI: Jeremy [name] is a 75 y.o. 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  Patient recently started palliative radiotherapy for his known lung cancer.  Over the last 4-5 days he has been having generalized weakness, cough productive of yellow sputum, fevers, diaphoresis, and chills.  He has not been able to attend his last 3 radiation treatments.  He has not had any dyspnea, chest pain, nausea, vomiting, abdominal pain, dysuria, or diarrhea.  He has had poor oral intake.  He reports occasional lightheadedness without syncope or fall.  He has not seen any obvious bleeding.  ED Course:  Initial vitals showed BP 104/65, pulse 113, RR 22, temp 97.8 F, SPO2 95% on room air.  T-max 102 F while in the ED.  Labs show WBC 28.6, hemoglobin 6.6, platelets 374,000, lactic acid 5.0, sodium 140, potassium 4.9, bicarb 27, BUN 28, creatinine 1.19, serum glucose 168, LFTs within normal limits, INR 1.1.  Urinalysis shows negative nitrates, negative leukocytes, 0-5 RBC/hpf, 6-10 WBC/hpf, rare bacteria microscopy.  Blood and urine cultures obtained and pending.  SARS-CoV-2 PCR in process.  Portable chest x-ray shows large left lower lobe cavitary lung mass as seen on prior with improved aeration on the left lung base.  Similar appearing right hemithorax  volume loss with pleural thickening or loculated effusion and airspace disease in right mid to lower lung again noted.  Patient was given 500 ccs normal saline, IV vancomycin and cefepime, and ordered to receive 1 unit PRBC transfusion.  The hospitalist service was consulted to admit for further evaluation and management.  Review of Systems: All systems reviewed and are negative except as documented in history of present illness above.   Past Medical History:  Diagnosis Date   Anemia    Cancer ([place])    mass right lung   COPD (chronic obstructive pulmonary disease) ([place])    QUIT SMOKING 30 YRS AGO   Diabetes ([place]) 2018   Dysrhythmia    PAF   Empyema ([place])    History of radiation therapy 08/11/2019 through 08/28/2019   right lung        Dr Gery Pray   HTN (hypertension)    Hyperlipidemia    Neuromuscular disorder ([place])    PONV (postoperative nausea and vomiting)    Psoriatic arthritis ([place]) 2015   Sepsis ([place])    possibly from right port and traveled to eye    Past Surgical History:  Procedure Laterality Date   [place]   BIOPSY  12/30/2018   Procedure: BIOPSY;  Surgeon: Danie Binder, MD;  [facility];  Service: Endoscopy;;  gastric   CATARACT EXTRACTION Bilateral    CERVICAL SPINE SURGERY  1995   COLONOSCOPY WITH PROPOFOL N/A 12/30/2018   Procedure: COLONOSCOPY WITH PROPOFOL;  Surgeon: Danie Binder, MD;  [facility];  Service: Endoscopy;  Laterality: N/A;  12:30pm   ESOPHAGOGASTRODUODENOSCOPY (EGD) WITH PROPOFOL N/A 12/30/2018   Procedure: ESOPHAGOGASTRODUODENOSCOPY (EGD) WITH PROPOFOL;  Surgeon: Danie Binder, MD;  [facility];  Service: Endoscopy;  Laterality: N/A;   EYE SURGERY     detached retina   IR IMAGING GUIDED PORT INSERTION  09/04/2019   LUMBAR LAMINECTOMY/ DECOMPRESSION WITH MET-RX Right 08/30/2020   Procedure: Right Lumbar three-four Microdiscectomy;  Surgeon: Kristeen Miss, MD;  Location: [facility];  Service:  Neurosurgery;  Laterality: Right;   POLYPECTOMY  12/30/2018   Procedure: POLYPECTOMY;  Surgeon: Danie Binder, MD;  [facility];  Service: Endoscopy;;  colon   THORACENTESIS Right 08/08/2019   Procedure: Thoracentesis;  Surgeon: [name], MD;  Location: [place];  Service: Pulmonary;  Laterality: Right;   VIDEO BRONCHOSCOPY Right 08/08/2019   Procedure: Video Bronchoscopy with [place]be Cryo Biopsy of Right mainstem;  Surgeon: [name], MD;  Location: [facility];  Service: Pulmonary;  Laterality: Right;    Social History:  reports that he quit smoking about 28 years ago. His smoking use included cigarettes. He has a 25.00 pack-year smoking history. He has never used smokeless tobacco. He reports current alcohol use. He reports that he does not use drugs.  Allergies  Allergen Reactions   Naproxen Sodium Hives, Swelling, Rash and Other (See Comments)    "Made the face, lips, and mouth swell- had to go to the E.R."   Pembrolizumab Rash and Other (See Comments)    KEYTRUDA   Nsaids Hives, Swelling and Rash    Family History  Problem Relation Age of Onset   Alcoholism Father    CAD Brother    Diabetes Brother    Colon cancer Neg Hx    Colon polyps Neg Hx    Gastric cancer Neg Hx      Prior to Admission medications   Medication Sig Start Date End Date Taking? Authorizing Provider  acetaminophen (TYLENOL) 325 MG tablet Take 2 tablets (650 mg total) by mouth every 6 (six) hours as needed for mild pain, fever or headache. Patient taking differently: Take 325 mg by mouth every 4 (four) hours as needed for mild pain, fever or headache. 10/28/19   Denton Brick, Courage, MD  albuterol (VENTOLIN HFA) 108 (90 Base) MCG/ACT inhaler Inhale 2 puffs into the lungs See admin instructions. Inhale 2 puffs into the lungs in the morning, then every 6 hours as needed for shortness of breath or wheezing    [provider]  azithromycin (ZITHROMAX) 250 MG tablet Take 1 tablet by mouth daily  for 3 more days Patient not taking: Reported on 11/10/2020 10/22/20   Donne Hazel, MD  cyclobenzaprine (FLEXERIL) 10 MG tablet Take 10 mg by mouth at bedtime as needed for muscle spasms. 11/23/19   [provider]  diltiazem (CARDIZEM CD) 360 MG 24 hr capsule TAKE 1 CAPSULE BY MOUTH EVERY DAY Patient taking differently: Take 360 mg by mouth daily. 09/23/20   Arnoldo Lenis, MD  docusate sodium (COLACE) 100 MG capsule Take 100 mg by mouth at bedtime.    [provider]  ferrous sulfate 325 (65 FE) MG tablet Take 325 mg by mouth daily with supper.    [provider]  folic acid (FOLVITE) 1 MG tablet Take 1 mg by mouth every evening.    [provider]  gabapentin (NEURONTIN) 300 MG capsule Take 600 mg by mouth at bedtime as needed (for neuropathy).    [provider]  meclizine (ANTIVERT) 25 MG tablet Take 25 mg by mouth 3 (three) times daily as needed for dizziness.    [provider]  metFORMIN (GLUCOPHAGE-XR) 500 MG 24 hr tablet Take 500-1,000 mg by mouth See admin instructions. Take 1,000 mg by mouth in the morning and 500 mg at bedtime 10/15/18   [provider]  methotrexate (RHEUMATREX) 2.5 MG tablet Take 10 mg by mouth every Sunday. Caution:Chemotherapy. Protect from light.    [provider]  metoprolol tartrate (LOPRESSOR) 25 MG tablet Take 1 tablet (25 mg total) by mouth 2 (two) times daily. 07/23/20   Arnoldo Lenis, MD  metroNIDAZOLE (METROCREAM) 0.75 % cream Apply 1 application topically 2 (two) times daily as needed (facial rosacea).     [provider]  mometasone (ELOCON) 0.1 % cream Apply 1 application topically 2 (two) times daily as needed (psoriasis).     [provider]  Multiple Vitamins-Minerals (CENTRUM ADULTS PO) Take 1 tablet by mouth daily at 6 (six) AM.    [provider]  Omega-3 Fatty Acids (FISH OIL) 1000 MG CAPS Take 1,000 mg by mouth daily.     [provider]  oxyCODONE-acetaminophen (PERCOCET/ROXICET) 5-325 MG tablet Take 1 tablet by mouth every 4 (four) hours as needed for severe pain.    [provider]  prednisoLONE acetate (PRED FORTE) 1 % ophthalmic suspension Place 1 drop into the left eye in the morning and at bedtime.    [provider]  tamsulosin (FLOMAX) 0.4 MG CAPS capsule Take 0.4 mg by mouth daily. 03/16/20   [provider]  traMADol (ULTRAM) 50 MG tablet Take 50 mg by mouth every 4 (four) hours as needed for moderate pain. 08/19/18   [provider]    Physical Exam: Vitals:   11/30/20 1730 11/30/20 1800 11/30/20 1830 11/30/20 1831  BP: (!) 109/56 (!) 111/58  (!) 106/56  Pulse: (!) 110 (!) 110  (!) 109  Resp: (!) 24 (!) 25  (!) 26  Temp:   (!) 102 F (38.9 C)   TempSrc:   Axillary   SpO2: 92% 92%  94%  Weight:      Height:       Constitutional: Elderly man resting supine in bed, appears fatigued but in NAD, calm, comfortable Eyes: PERRL, lids and conjunctivae normal ENMT: Mucous membranes are dry. Posterior pharynx clear of any exudate or lesions.Normal dentition.  Neck: normal, supple, no masses. Respiratory: clear to auscultation bilaterally, no wheezing, no crackles. Normal respiratory effort. No accessory muscle use.  Cardiovascular: Tachycardic, no murmurs / rubs / gallops. No extremity edema. 2+ pedal pulses. Abdomen: no tenderness, no masses palpated. No hepatosplenomegaly.  Musculoskeletal: no clubbing / cyanosis. No joint deformity upper and lower extremities. Good ROM, no contractures. Normal muscle tone.  Skin: no rashes, lesions, ulcers. No induration Neurologic: CN 2-12 grossly intact. Sensation intact. Strength 5/5 in all 4.  Psychiatric: Normal judgment and insight. Alert and oriented x 3. Normal mood.  Labs on Admission: I have personally reviewed following labs and imaging studies  CBC: Recent Labs  Lab 11/30/20 1627  WBC 28.6*  NEUTROABS 26.4*  HGB 6.6*  HCT  21.9*  MCV 104.8*  PLT 643   Basic Metabolic Panel: Recent Labs  Lab 11/30/20 1627  NA 140  K 4.9  CL 101  CO2 27  GLUCOSE 168*  BUN 28*  CREATININE 1.19  CALCIUM 9.6   GFR: Estimated Creatinine Clearance: 61.4 mL/min (by C-G formula based on  SCr of 1.19 mg/dL). Liver Function Tests: Recent Labs  Lab 11/30/20 1627  AST 18  ALT 19  ALKPHOS 72  BILITOT 0.4  PROT 7.0  ALBUMIN 2.4*   No results for input(s): LIPASE, AMYLASE in the last 168 hours. No results for input(s): AMMONIA in the last 168 hours. Coagulation Profile: Recent Labs  Lab 11/30/20 1627  INR 1.1   Cardiac Enzymes: No results for input(s): CKTOTAL, CKMB, CKMBINDEX, TROPONINI in the last 168 hours. BNP (last 3 results) No results for input(s): PROBNP in the last 8760 hours. HbA1C: No results for input(s): HGBA1C in the last 72 hours. CBG: No results for input(s): GLUCAP in the last 168 hours. Lipid Profile: No results for input(s): CHOL, HDL, LDLCALC, TRIG, CHOLHDL, LDLDIRECT in the last 72 hours. Thyroid Function Tests: No results for input(s): TSH, T4TOTAL, FREET4, T3FREE, THYROIDAB in the last 72 hours. Anemia Panel: No results for input(s): VITAMINB12, FOLATE, FERRITIN, TIBC, IRON, RETICCTPCT in the last 72 hours. Urine analysis:    Component Value Date/Time   COLORURINE YELLOW 11/30/2020 1627   APPEARANCEUR HAZY (A) 11/30/2020 1627   LABSPEC 1.018 11/30/2020 1627   PHURINE 5.0 11/30/2020 1627   GLUCOSEU NEGATIVE 11/30/2020 1627   HGBUR NEGATIVE 11/30/2020 1627   [place] NEGATIVE 11/30/2020 1627   KETONESUR NEGATIVE 11/30/2020 1627   PROTEINUR 30 (A) 11/30/2020 1627   NITRITE NEGATIVE 11/30/2020 1627   LEUKOCYTESUR NEGATIVE 11/30/2020 1627    Radiological Exams on Admission: DG Chest Port 1 View  Result Date: 11/30/2020 CLINICAL DATA:  Fever weakness EXAM: PORTABLE CHEST 1 VIEW COMPARISON:  10/22/2020, CT 06/07/2020, 10/04/2020, PET CT 10/15/2020 FINDINGS: Large cavitary mass  lesion in the left lower lobe. Not much interval change in volume loss on the right with pleural thickening or loculated effusion and airspace disease in the right mid to lower lung. Mild shift of mediastinal contents to the right. Partially obscured cardiomediastinal silhouette. No pneumothorax. IMPRESSION: 1. Large left lower lobe cavitary lung mass as before. Improved aeration on the left with better aeration of left lung base since May 2022 comparison 2. Not much interval change in right hemithorax volume loss, right pleural thickening or loculated effusion and airspace disease in the right mid to lower lung. Electronically Signed   By: Donavan Foil M.D.   On: 11/30/2020 16:22    EKG: Personally reviewed. Sinus tachycardia, similar to prior.  Assessment/Plan Principal Problem:   Severe sepsis with lactic acidosis (HCC) Active Problems:   Type 2 diabetes mellitus (HCC)   HTN (hypertension)   AF (paroxysmal atrial fibrillation) (HCC)   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HCC)   Jeremy [name] is a 75 y.o. 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  Severe sepsis, POA: Patient presenting with fever, tachycardia, tachypnea, leukocytosis, and lactic acidosis.  No clear infectious etiology although respiratory/pulmonary source suspected with possible right-sided effusion/consolidation on CXR. -Continue broad empiric antibiotics with IV vancomycin, cefepime, Flagyl -Follow blood cultures -COVID and influenza negative -Receiving additional 1 L fluid bolus followed by continued IV fluid hydration overnight -Supplemental  oxygen as needed  Acute on chronic anemia: Denies any obvious bleeding, suspect chronic blood loss  from cancer.  Transfusing 1 unit PRBC.  Chronic respiratory failure with hypoxia: Continue supplemental oxygen as needed.  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 Lorna Few and radiation oncology, Dr. Sondra Come.  Recently started on palliative radiotherapy.  Paroxysmal atrial fibrillation: Not on anticoagulation due to transfusion dependent anemia.  Rate currently controlled, holding home Lopressor and diltiazem given soft blood pressures.  Type 2 diabetes: Hold home metformin and place on SSI.  Hypertension: Antihypertensives on hold due to soft blood pressures.  Psoriatic arthritis: Hold methotrexate.  DVT prophylaxis: SCDs Code Status: DNR, confirmed with patient Family Communication: Discussed with patient spouse at bedside Disposition Plan: From home, dispo pending clinical progress Consults called: None Level of care: Telemetry Admission status:  Status is: Inpatient  Remains inpatient appropriate because:Hemodynamically unstable, Ongoing diagnostic testing needed not appropriate for outpatient work up, IV treatments appropriate due to intensity of illness or inability to take PO, and Inpatient level of care appropriate due to severity of illness  Dispo: The patient is from: Home              Anticipated d/c is to: Home              Patient currently is not medically stable to d/c.   Zada Finders MD [facility]  If 7PM-7AM, please contact night-coverage [email or web address]  11/30/2020, 6:40 PM

## 2020-11-30 NOTE — ED Notes (Signed)
ED TO INPATIENT HANDOFF REPORT  Name/Age/Gender Jeremy [name] 75 y.o. male  Code Status    Code Status Orders  (From admission, onward)         Start     Ordered   11/30/20 1950  Do not attempt resuscitation (DNR)  Continuous       Question Answer Comment  In the event of cardiac or respiratory ARREST Do not call a "code blue"   In the event of cardiac or respiratory ARREST Do not perform Intubation, CPR, defibrillation or ACLS   In the event of cardiac or respiratory ARREST Use medication by any route, position, wound care, and other measures to relive pain and suffering. May use oxygen, suction and manual treatment of airway obstruction as needed for comfort.      11/30/20 1951        Code Status History    Date Active Date Inactive Code Status Order ID Comments User Context   10/21/2020 2144 10/22/2020 2325 DNR [id number]  Rise Patience, MD ED   08/30/2020 1601 08/31/2020 0035 Full Code [id number]  Kristeen Miss, MD Inpatient   11/09/2019 0607 11/18/2019 1531 DNR [id number]  Vianne Bulls, MD ED   10/19/2019 1924 10/28/2019 2348 DNR [id number]  [name], MD ED   10/16/2019 0837 10/18/2019 2103 DNR [id number]  Antonieta Pert, MD ED   08/07/2019 0353 08/12/2019 2142 Full Code [id number]  Murlean Iba, MD Inpatient    Advance Directive Documentation   Flowsheet Row Most Recent Value  Type of Advance Directive Healthcare Power of Attorney, Living will  Pre-existing out of facility DNR order (yellow form or pink MOST form) --  "MOST" Form in Place? --      Home/SNF/Other Home  Chief Complaint Severe sepsis with lactic acidosis ([place]) [A41.9, E87.2, R65.20]  Level of Care/Admitting Diagnosis ED Disposition    ED Disposition  Admit   Condition  --   [place]: [place] [[id number]]  Level of Care: Telemetry [5]  Admit to tele based on following criteria: Complex arrhythmia (Bradycardia/Tachycardia)  May admit patient to [facility]  or [facility] if equivalent level of care is available:: No  Covid Evaluation: Confirmed COVID Negative  Diagnosis: Severe sepsis with lactic acidosis [facility]) [[id number]]  Admitting Physician: Lenore Cordia [[id number]]  Attending Physician: Lenore Cordia [[id number]]  Estimated length of stay: past midnight tomorrow  Certification:: I certify this patient will need inpatient services for at least 2 midnights         Medical History Past Medical History:  Diagnosis Date  . Anemia   . 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 COPD (chronic obstructive pulmonary disease) (H[facility])    QUIT SMOKING 30 YRS AGO  . Diabetes ([place]) 2018  . Dysrhythmia    PAF  . Empyema ([place])   . History of radiation therapy 08/11/2019 through 08/28/2019   right lung        Dr Gery Pray  . HTN (hypertension)   . Hyperlipidemia   . Neuromuscular disorder ([place])   . PONV (postoperative nausea and vomiting)   . Psoriatic arthritis ([place]) 2015  . Sepsis ([place])    possibly from right port and traveled to eye    Allergies Allergies  Allergen Reactions  . Naproxen Sodium Hives, Swelling, Rash and Other (See Comments)    "Made the face, lips, and mouth swell- had to go to the E.R."  . Pembrolizumab Rash and  Other (See Comments)    KEYTRUDA  . Nsaids Hives, Swelling and Rash    IV Location/Drains/Wounds Patient Lines/Drains/Airways Status    Active Line/Drains/Airways    Name Placement date Placement time Site Days   Implanted Port 09/04/19 Right Chest 09/04/19  1503  Chest  453   Peripheral IV 11/30/20 20 G 1" Left Antecubital 11/30/20  1621  Antecubital  less than 1   Peripheral IV 11/30/20 20 G 1" Right Antecubital 11/30/20  1626  Antecubital  less than 1          Labs/Imaging Results for orders placed or performed during the hospital encounter of 11/30/20 (from the past 48 hour(s))  Lactic acid, plasma     Status: Abnormal   Collection Time: 11/30/20  4:27 PM  Result Value Ref Range   Lactic Acid, Venous  5.0 (HH) 0.5 - 1.9 mmol/L    Comment: CRITICAL RESULT CALLED TO, READ BACK BY AND VERIFIED WITH: Lynnea Ferrier 1740 11/30/20 KELLY, J. Performed at [facility], [place] [address], [place], [place] [address]   Comprehensive metabolic panel     Status: Abnormal   Collection Time: 11/30/20  4:27 PM  Result Value Ref Range   Sodium 140 135 - 145 mmol/L   Potassium 4.9 3.5 - 5.1 mmol/L   Chloride 101 98 - 111 mmol/L   CO2 27 22 - 32 mmol/L   Glucose, Bld 168 (H) 70 - 99 mg/dL    Comment: Glucose reference range applies only to samples taken after fasting for at least 8 hours.   BUN 28 (H) 8 - 23 mg/dL   Creatinine, Ser 1.19 0.61 - 1.24 mg/dL   Calcium 9.6 8.9 - 10.3 mg/dL   Total Protein 7.0 6.5 - 8.1 g/dL   Albumin 2.4 (L) 3.5 - 5.0 g/dL   AST 18 15 - 41 U/L   ALT 19 0 - 44 U/L   Alkaline Phosphatase 72 38 - 126 U/L   Total Bilirubin 0.4 0.3 - 1.2 mg/dL   GFR, Estimated >60 >60 mL/min    Comment: (NOTE) Calculated using the CKD-EPI Creatinine Equation (2021)    Anion gap 12 5 - 15    Comment: Performed at [facility], [place] [address], [place], [place] [address]  CBC WITH DIFFERENTIAL     Status: Abnormal   Collection Time: 11/30/20  4:27 PM  Result Value Ref Range   WBC 28.6 (H) 4.0 - 10.5 K/uL   RBC 2.09 (L) 4.22 - 5.81 MIL/uL   Hemoglobin 6.6 (LL) 13.0 - 17.0 g/dL    Comment: REPEATED TO VERIFY THIS CRITICAL RESULT HAS VERIFIED AND BEEN CALLED TO DOSS,M RN BY MARINDA BLACK ON 06 28 2022 AT 1731, AND HAS BEEN READ BACK. CRITICAL HGB 6.6 RBV TO DOSS,M RN    HCT 21.9 (L) 39.0 - 52.0 %   MCV 104.8 (H) 80.0 - 100.0 fL   MCH 31.6 26.0 - 34.0 pg   MCHC 30.1 30.0 - 36.0 g/dL   RDW 17.2 (H) 11.5 - 15.5 %   Platelets 374 150 - 400 K/uL   nRBC 0.0 0.0 - 0.2 %   Neutrophils Relative % 93 %   Neutro Abs 26.4 (H) 1.7 - 7.7 K/uL   Lymphocytes Relative 3 %   Lymphs Abs 0.9 0.7 - 4.0 K/uL   Monocytes Relative 1 %   Monocytes Absolute 0.4 0.1 - 1.0  K/uL   Eosinophils Relative 1 %   Eosinophils Absolute  0.3 0.0 - 0.5 K/uL   Basophils Relative 0 %   Basophils Absolute 0.1 0.0 - 0.1 K/uL   WBC Morphology TOXIC GRANULATION     Comment: VACUOLATED NEUTROPHILS   Immature Granulocytes 2 %   Abs Immature Granulocytes 0.45 (H) 0.00 - 0.07 K/uL    Comment: Performed at [facility], [place] [address] [place], [place] [address]  Protime-INR     Status: None   Collection Time: 11/30/20  4:27 PM  Result Value Ref Range   Prothrombin Time 14.1 11.4 - 15.2 seconds   INR 1.1 0.8 - 1.2    Comment: (NOTE) INR goal varies based on device and disease states. Performed at [facility], [place] [address] [place], [place] [address]   APTT     Status: None   Collection Time: 11/30/20  4:27 PM  Result Value Ref Range   aPTT 34 24 - 36 seconds    Comment: Performed at [facility], [place] [address] [place], [place] [address]  Urinalysis, Routine w reflex microscopic     Status: Abnormal   Collection Time: 11/30/20  4:27 PM  Result Value Ref Range   Color, Urine YELLOW YELLOW   APPearance HAZY (A) CLEAR   Specific Gravity, Urine 1.018 1.005 - 1.030   pH 5.0 5.0 - 8.0   Glucose, UA NEGATIVE NEGATIVE mg/dL   Hgb urine dipstick NEGATIVE NEGATIVE   Bilirubin Urine NEGATIVE NEGATIVE   Ketones, ur NEGATIVE NEGATIVE mg/dL   Protein, ur 30 (A) NEGATIVE mg/dL   Nitrite NEGATIVE NEGATIVE   Leukocytes,Ua NEGATIVE NEGATIVE   RBC / HPF 0-5 0 - 5 RBC/hpf   WBC, UA 6-10 0 - 5 WBC/hpf   Bacteria, UA RARE (A) NONE SEEN   Squamous Epithelial / LPF 0-5 0 - 5   Mucus PRESENT    Hyaline Casts, UA PRESENT     Comment: Performed at [facility], [place] [address], [place], [place] [address]  Resp Panel by RT-PCR (Flu A&B, Covid)     Status: None   Collection Time: 11/30/20  4:27 PM   Specimen: Nasopharyngeal(NP) swabs in vial transport medium  Result Value Ref Range   SARS Coronavirus 2 by RT  PCR NEGATIVE NEGATIVE    Comment: (NOTE) SARS-CoV-2 target nucleic acids are NOT DETECTED.  The SARS-CoV-2 RNA is generally detectable in upper respiratory specimens during the acute phase of infection. The lowest concentration of SARS-CoV-2 viral copies this assay can detect is 138 copies/mL. A negative result does not preclude SARS-Cov-2 infection and should not be used as the sole basis for treatment or other patient management decisions. A negative result may occur with  improper specimen collection/handling, submission of specimen other than nasopharyngeal swab, presence of viral mutation(s) within the areas targeted by this assay, and inadequate number of viral copies(<138 copies/mL). A negative result must be combined with clinical observations, patient history, and epidemiological information. The expected result is Negative.  Fact Sheet for Patients:  EntrepreneurPulse.com.au  Fact Sheet for Healthcare Providers:  IncredibleEmployment.be  This test is no t yet approved or cleared by the [place] FDA and  has been authorized for detection and/or diagnosis of SARS-CoV-2 by FDA under an Emergency Use Authorization (EUA). This EUA will remain  in effect (meaning this test can be used) for the duration of the COVID-19 declaration under Section 564(b)(1) of the Act, 21 U.S.C.section 360bbb-3(b)(1), unless the authorization is terminated  or revoked sooner.  Influenza A by PCR NEGATIVE NEGATIVE   Influenza B by PCR NEGATIVE NEGATIVE    Comment: (NOTE) The Xpert Xpress SARS-CoV-2/FLU/RSV plus assay is intended as an aid in the diagnosis of influenza from Nasopharyngeal swab specimens and should not be used as a sole basis for treatment. Nasal washings and aspirates are unacceptable for Xpert Xpress SARS-CoV-2/FLU/RSV testing.  Fact Sheet for Patients: EntrepreneurPulse.com.au  Fact Sheet for Healthcare  Providers: IncredibleEmployment.be  This test is not yet approved or cleared by the [place] FDA and has been authorized for detection and/or diagnosis of SARS-CoV-2 by FDA under an Emergency Use Authorization (EUA). This EUA will remain in effect (meaning this test can be used) for the duration of the COVID-19 declaration under Section 564(b)(1) of the Act, 21 U.S.C. section 360bbb-3(b)(1), unless the authorization is terminated or revoked.  Performed at [facility], [place] [address] [place], [place] [address]   Prepare RBC (crossmatch)     Status: None   Collection Time: 11/30/20  6:05 PM  Result Value Ref Range   Order Confirmation      ORDER PROCESSED BY BLOOD BANK Performed at [facility], [place] [address], [place], [place] [address]   Type and screen [place]     Status: None (Preliminary result)   Collection Time: 11/30/20  6:05 PM  Result Value Ref Range   ABO/RH(D) A POS    Antibody Screen NEG    Sample Expiration 12/03/2020,2359    Unit Number [id number]    Blood Component Type RED CELLS,LR    Unit division 00    Status of Unit ISSUED    Transfusion Status OK TO TRANSFUSE    Crossmatch Result      Compatible Performed at [facility], [place] [address] [place], [place] [address]   Lactic acid, plasma     Status: None   Collection Time: 11/30/20  6:27 PM  Result Value Ref Range   Lactic Acid, Venous 1.1 0.5 - 1.9 mmol/L    Comment: Performed at [facility], [place] [address], [place], [place] [address]   DG Chest Port 1 View  Result Date: 11/30/2020 CLINICAL DATA:  Fever weakness EXAM: PORTABLE CHEST 1 VIEW COMPARISON:  10/22/2020, CT 06/07/2020, 10/04/2020, PET CT 10/15/2020 FINDINGS: Large cavitary mass lesion in the left lower lobe. Not much interval change in volume loss on the right with pleural thickening or loculated effusion and airspace  disease in the right mid to lower lung. Mild shift of mediastinal contents to the right. Partially obscured cardiomediastinal silhouette. No pneumothorax. IMPRESSION: 1. Large left lower lobe cavitary lung mass as before. Improved aeration on the left with better aeration of left lung base since May 2022 comparison 2. Not much interval change in right hemithorax volume loss, right pleural thickening or loculated effusion and airspace disease in the right mid to lower lung. Electronically Signed   By: Donavan Foil M.D.   On: 11/30/2020 16:22    Pending Labs Unresulted Labs (From admission, onward)    Start     Ordered   12/01/20 0500  Procalcitonin  Tomorrow morning,   R        11/30/20 1951   12/01/20 3151  Basic metabolic panel  Tomorrow morning,   R        11/30/20 1951   12/01/20 0500  CBC  Tomorrow morning,   R        11/30/20 1951   11/30/20  1953  Expectorated Sputum Assessment w Gram Stain, Rflx to Resp Cult  Once,   R        11/30/20 1952   11/30/20 1953  Strep pneumoniae urinary antigen  Add-on,   AD        11/30/20 1952   11/30/20 1541  Urine culture  (Undifferentiated presentation (screening labs and basic nursing orders))  ONCE - STAT,   STAT        11/30/20 1540   11/30/20 1541  Blood Culture (routine x 2)  (Undifferentiated presentation (screening labs and basic nursing orders))  BLOOD CULTURE X 2,   STAT      11/30/20 1540          Vitals/Pain Today's Vitals   11/30/20 2048 11/30/20 2100 11/30/20 2105 11/30/20 2132  BP: (!) 94/53 (!) 88/55 (!) 91/55 (!) 93/54  Pulse: 92 89 90 87  Resp: (!) 22 (!) 24 (!) 23 (!) 24  Temp: 97.8 F (36.6 C)  97.7 F (36.5 C) 98 F (36.7 C)  TempSrc: Oral  Oral Oral  SpO2: 93% 98% 97% 97%  Weight:      Height:      PainSc:        Isolation Precautions No active isolations  Medications Medications  0.9 %  sodium chloride infusion (0 mL/hr Intravenous Hold 11/30/20 1819)  sodium chloride flush (NS) 0.9 % injection 3 mL (has no  administration in time range)  lactated ringers infusion ( Intravenous New Bag/Given 11/30/20 2012)  metroNIDAZOLE (FLAGYL) IVPB 500 mg (500 mg Intravenous New Bag/Given 11/30/20 2012)  acetaminophen (TYLENOL) tablet 650 mg (has no administration in time range)    Or  acetaminophen (TYLENOL) suppository 650 mg (has no administration in time range)  ondansetron (ZOFRAN) tablet 4 mg (has no administration in time range)    Or  ondansetron (ZOFRAN) injection 4 mg (has no administration in time range)  albuterol (PROVENTIL) (2.5 MG/3ML) 0.083% nebulizer solution 2.5 mg (has no administration in time range)  guaiFENesin (MUCINEX) 12 hr tablet 600 mg (has no administration in time range)  vancomycin (VANCOREADY) IVPB 1500 mg/300 mL (has no administration in time range)  ceFEPIme (MAXIPIME) 2 g in sodium chloride 0.9 % 100 mL IVPB (has no administration in time range)  insulin aspart (novoLOG) injection 0-9 Units (has no administration in time range)  sodium chloride 0.9 % bolus 500 mL (0 mLs Intravenous Stopped 11/30/20 1752)  vancomycin (VANCOREADY) IVPB 1750 mg/350 mL (0 mg Intravenous Stopped 11/30/20 2007)  ceFEPIme (MAXIPIME) 2 g in sodium chloride 0.9 % 100 mL IVPB (0 g Intravenous Stopped 11/30/20 1752)  acetaminophen (TYLENOL) tablet 1,000 mg (1,000 mg Oral Given 11/30/20 1836)  sodium chloride 0.9 % bolus 1,000 mL (1,000 mLs Intravenous New Bag/Given (Non-Interop) 11/30/20 1941)    Mobility walks with person assist

## 2020-11-30 NOTE — ED Provider Notes (Signed)
[facility] DEPT Provider Note   CSN: [id number] Arrival date & time: 11/30/20  1520     History Chief Complaint  Patient presents with   CA Pt   Fever   Weakness    Jeremy [name] is a 75 y.o. male.  75 year old male with prior medical history as detailed below presents for evaluation.  Patient presents from home with his wife.  Patient is known to oncology.  Per wife, patient with increasing weakness over the last 4 to 5 days.  Patient unable to attend radiation therapy for the last 3 days secondary to generalized weakness.  Patient with apparent chills and rigors at home at night.  Reported subjective fevers at home.  Patient without significant shortness of breath.  Patient denies worsening cough.  Patient reports decreased p.o. intake.  The history is provided by the patient, medical records and the spouse.  Weakness Severity:  Moderate Onset quality:  Gradual Duration:  5 days Timing:  Constant Progression:  Worsening Chronicity:  New Relieved by:  Nothing Worsened by:  Nothing     Past Medical History:  Diagnosis Date   Anemia    Cancer ([place])    mass right lung   COPD (chronic obstructive pulmonary disease) ([place])    QUIT SMOKING 30 YRS AGO   Diabetes ([place]) 2018   Dysrhythmia    PAF   Empyema ([place])    History of radiation therapy 08/11/2019 through 08/28/2019   right lung        Dr Gery Pray   HTN (hypertension)    Hyperlipidemia    Neuromuscular disorder ([place])    PONV (postoperative nausea and vomiting)    Psoriatic arthritis ([place]) 2015   Sepsis ([place])    possibly from right port and traveled to eye    Patient Active Problem List   Diagnosis Date Noted   SIRS (systemic inflammatory response syndrome) ([place]) 10/21/2020   Herniated nucleus pulposus, L3-4 08/30/2020   Recurrent right pleural effusion    Advanced care planning/counseling discussion    Palliative care by specialist    Palliative care encounter    Loculated  pleural effusion 11/08/2019   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/08/2019   Port-A-Cath in place 11/05/2019   Pleural effusion 11/05/2019   Sepsis due to undetermined organism ([place]) 10/19/2019   Thrombocytopenia ([place]) 10/19/2019   Failure to thrive in adult 10/16/2019   Generalized weakness 10/16/2019   Drug-induced skin rash 09/23/2019   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   Encounter for antineoplastic chemotherapy 08/26/2019   Encounter for antineoplastic immunotherapy 08/26/2019   Goals of care, counseling/discussion 08/26/2019   AF (paroxysmal atrial fibrillation) ([place])    Protein-calorie malnutrition, severe 08/07/2019   Hemoptysis 08/06/2019   Mass of right lung 08/06/2019   Former smoker 08/06/2019   Lung mass 08/06/2019   Postobstructive pneumonia 08/06/2019   COPD (chronic obstructive pulmonary disease) ([facility])    HTN (hypertension)    Abnormal weight loss    Leukocytosis    Acute and chronic respiratory failure with hypoxia ([facility])    Anemia in neoplastic disease    Hyperglycemia    Heme + stool 11/06/2018   Type 2 diabetes mellitus ([place]) 2018    Past Surgical History:  Procedure Laterality Date   [place]   BIOPSY  12/30/2018   Procedure: BIOPSY;  Surgeon: Danie Binder, MD;  [facility];  Service: Endoscopy;;  gastric   CATARACT EXTRACTION Bilateral  CERVICAL SPINE SURGERY  1995   COLONOSCOPY WITH PROPOFOL N/A 12/30/2018   Procedure: COLONOSCOPY WITH PROPOFOL;  Surgeon: Danie Binder, MD;  [facility];  Service: Endoscopy;  Laterality: N/A;  12:30pm   ESOPHAGOGASTRODUODENOSCOPY (EGD) WITH PROPOFOL N/A 12/30/2018   Procedure: ESOPHAGOGASTRODUODENOSCOPY (EGD) WITH PROPOFOL;  Surgeon: Danie Binder, MD;  [facility];  Service: Endoscopy;  Laterality: N/A;   EYE SURGERY     detached retina   IR IMAGING GUIDED PORT INSERTION  09/04/2019   LUMBAR LAMINECTOMY/ DECOMPRESSION WITH MET-RX Right  08/30/2020   Procedure: Right Lumbar three-four Microdiscectomy;  Surgeon: Kristeen Miss, MD;  Location: [facility];  Service: Neurosurgery;  Laterality: Right;   POLYPECTOMY  12/30/2018   Procedure: POLYPECTOMY;  Surgeon: Danie Binder, MD;  [facility];  Service: Endoscopy;;  colon   THORACENTESIS Right 08/08/2019   Procedure: Thoracentesis;  Surgeon: [name], MD;  Location: [facility];  Service: Pulmonary;  Laterality: Right;   VIDEO BRONCHOSCOPY Right 08/08/2019   Procedure: Video Bronchoscopy with Erbe Cryo Biopsy of Right mainstem;  Surgeon: [name], MD;  Location: [facility] OR;  Service: Pulmonary;  Laterality: Right;       Family History  Problem Relation Age of Onset   Alcoholism Father    CAD Brother    Diabetes Brother    Colon cancer Neg Hx    Colon polyps Neg Hx    Gastric cancer Neg Hx     Social History   Tobacco Use   Smoking status: Former    Packs/day: 0.50    Years: 50.00    Pack years: 25.00    Types: Cigarettes    Quit date: 12/26/1991    Years since quitting: 28.9   Smokeless tobacco: Never  Vaping Use   Vaping Use: Never used  Substance Use Topics   Alcohol use: Yes    Comment: occ beer   Drug use: Never    Home Medications Prior to Admission medications   Medication Sig Start Date End Date Taking? Authorizing Provider  acetaminophen (TYLENOL) 325 MG tablet Take 2 tablets (650 mg total) by mouth every 6 (six) hours as needed for mild pain, fever or headache. Patient taking differently: Take 325 mg by mouth every 4 (four) hours as needed for mild pain, fever or headache. 10/28/19   Denton Brick, Courage, MD  albuterol (VENTOLIN HFA) 108 (90 Base) MCG/ACT inhaler Inhale 2 puffs into the lungs See admin instructions. Inhale 2 puffs into the lungs in the morning, then every 6 hours as needed for shortness of breath or wheezing    [provider]  azithromycin (ZITHROMAX) 250 MG tablet Take 1 tablet by mouth daily for 3 more days Patient not  taking: Reported on 11/10/2020 10/22/20   Donne Hazel, MD  cyclobenzaprine (FLEXERIL) 10 MG tablet Take 10 mg by mouth at bedtime as needed for muscle spasms. 11/23/19   [provider]  diltiazem (CARDIZEM CD) 360 MG 24 hr capsule TAKE 1 CAPSULE BY MOUTH EVERY DAY Patient taking differently: Take 360 mg by mouth daily. 09/23/20   Arnoldo Lenis, MD  docusate sodium (COLACE) 100 MG capsule Take 100 mg by mouth at bedtime.    [provider]  ferrous sulfate 325 (65 FE) MG tablet Take 325 mg by mouth daily with supper.    [provider]  folic acid (FOLVITE) 1 MG tablet Take 1 mg by mouth every evening.    [provider]  gabapentin (NEURONTIN) 300 MG capsule Take 600 mg by mouth at bedtime as needed (for neuropathy).    [provider]  meclizine (ANTIVERT) 25 MG tablet Take 25 mg by mouth 3 (three) times daily as needed for dizziness.    [provider]  metFORMIN (GLUCOPHAGE-XR) 500 MG 24 hr tablet Take 500-1,000 mg by mouth See admin instructions. Take 1,000 mg by mouth in the morning and 500 mg at bedtime 10/15/18   [provider]  methotrexate (RHEUMATREX) 2.5 MG tablet Take 10 mg by mouth every Sunday. Caution:Chemotherapy. Protect from light.    [provider]  metoprolol tartrate (LOPRESSOR) 25 MG tablet Take 1 tablet (25 mg total) by mouth 2 (two) times daily. 07/23/20   Arnoldo Lenis, MD  metroNIDAZOLE (METROCREAM) 0.75 % cream Apply 1 application topically 2 (two) times daily as needed (facial rosacea).     [provider]  mometasone (ELOCON) 0.1 % cream Apply 1 application topically 2 (two) times daily as needed (psoriasis).     [provider]  Multiple Vitamins-Minerals (CENTRUM ADULTS PO) Take 1 tablet by mouth daily at 6 (six) AM.    [provider]  Omega-3 Fatty Acids (FISH OIL) 1000 MG CAPS Take 1,000 mg by mouth daily.     [provider]  oxyCODONE-acetaminophen  (PERCOCET/ROXICET) 5-325 MG tablet Take 1 tablet by mouth every 4 (four) hours as needed for severe pain.    [provider]  prednisoLONE acetate (PRED FORTE) 1 % ophthalmic suspension Place 1 drop into the left eye in the morning and at bedtime.    [provider]  tamsulosin (FLOMAX) 0.4 MG CAPS capsule Take 0.4 mg by mouth daily. 03/16/20   [provider]  traMADol (ULTRAM) 50 MG tablet Take 50 mg by mouth every 4 (four) hours as needed for moderate pain. 08/19/18   [provider]    Allergies    Naproxen sodium, Pembrolizumab, and Nsaids  Review of Systems   Review of Systems  Neurological:  Positive for weakness.  All other systems reviewed and are negative.  Physical Exam Updated Vital Signs BP (!) 109/56   Pulse (!) 110   Temp 97.8 F (36.6 C) (Oral)   Resp (!) 24   Ht '6\' 2"'  (1.88 m)   Wt 83 kg   SpO2 92%   BMI 23.50 kg/m   Physical Exam Vitals and nursing note reviewed.  Constitutional:      General: He is not in acute distress.    Appearance: Normal appearance. He is well-developed.  HENT:     Head: Normocephalic and atraumatic.     Mouth/Throat:     Mouth: Mucous membranes are dry.  Eyes:     Conjunctiva/sclera: Conjunctivae normal.     Pupils: Pupils are equal, round, and reactive to light.  Cardiovascular:     Rate and Rhythm: Regular rhythm. Tachycardia present.     Heart sounds: Normal heart sounds.  Pulmonary:     Effort: Pulmonary effort is normal. No respiratory distress.     Breath sounds: Normal breath sounds.  Abdominal:     General: There is no distension.     Palpations: Abdomen is soft.     Tenderness: There is no abdominal tenderness.  Musculoskeletal:        General: No deformity. Normal range of motion.     Cervical back: Normal range of motion and neck supple.  Skin:    General: Skin is warm and  dry.  Neurological:     General: No focal deficit present.     Mental Status: He is alert and oriented  to person, place, and time. Mental status is at baseline.    ED Results / Procedures / Treatments   Labs (all labs ordered are listed, but only abnormal results are displayed) Labs Reviewed  LACTIC ACID, PLASMA - Abnormal; Notable for the following components:      Result Value   Lactic Acid, Venous 5.0 (*)    All other components within normal limits  COMPREHENSIVE METABOLIC PANEL - Abnormal; Notable for the following components:   Glucose, Bld 168 (*)    BUN 28 (*)    Albumin 2.4 (*)    All other components within normal limits  CBC WITH DIFFERENTIAL/PLATELET - Abnormal; Notable for the following components:   WBC 28.6 (*)    RBC 2.09 (*)    Hemoglobin 6.6 (*)    HCT 21.9 (*)    MCV 104.8 (*)    RDW 17.2 (*)    Neutro Abs 26.4 (*)    Abs Immature Granulocytes 0.45 (*)    All other components within normal limits  URINALYSIS, ROUTINE W REFLEX MICROSCOPIC - Abnormal; Notable for the following components:   APPearance HAZY (*)    Protein, ur 30 (*)    Bacteria, UA RARE (*)    All other components within normal limits  URINE CULTURE  CULTURE, BLOOD (ROUTINE X 2)  CULTURE, BLOOD (ROUTINE X 2)  RESP PANEL BY RT-PCR (FLU A&B, COVID) ARPGX2  PROTIME-INR  APTT  LACTIC ACID, PLASMA    EKG None  Radiology DG Chest Port 1 View  Result Date: 11/30/2020 CLINICAL DATA:  Fever weakness EXAM: PORTABLE CHEST 1 VIEW COMPARISON:  10/22/2020, CT 06/07/2020, 10/04/2020, PET CT 10/15/2020 FINDINGS: Large cavitary mass lesion in the left lower lobe. Not much interval change in volume loss on the right with pleural thickening or loculated effusion and airspace disease in the right mid to lower lung. Mild shift of mediastinal contents to the right. Partially obscured cardiomediastinal silhouette. No pneumothorax. IMPRESSION: 1. Large left lower lobe cavitary lung mass as before. Improved aeration on the left with better aeration of left lung base since May 2022 comparison 2. Not much interval  change in right hemithorax volume loss, right pleural thickening or loculated effusion and airspace disease in the right mid to lower lung. Electronically Signed   By: Donavan Foil M.D.   On: 11/30/2020 16:22    Procedures Procedures  CRITICAL CARE Performed by: Valarie Merino   Total critical care time: 30 minutes  Critical care time was exclusive of separately billable procedures and treating other patients.  Critical care was necessary to treat or prevent imminent or life-threatening deterioration.  Critical care was time spent personally by me on the following activities: development of treatment plan with patient and/or surrogate as well as nursing, discussions with consultants, evaluation of patient's response to treatment, examination of patient, obtaining history from patient or surrogate, ordering and performing treatments and interventions, ordering and review of laboratory studies, ordering and review of radiographic studies, pulse oximetry and re-evaluation of patient's condition.   Medications Ordered in ED Medications  vancomycin (VANCOREADY) IVPB 1750 mg/350 mL (has no administration in time range)  sodium chloride 0.9 % bolus 500 mL (500 mLs Intravenous New Bag/Given 11/30/20 1645)  ceFEPIme (MAXIPIME) 2 g in sodium chloride 0.9 % 100 mL IVPB (2 g Intravenous New Bag/Given 11/30/20 1646)  ED Course  I have reviewed the triage vital signs and the nursing notes.  Pertinent labs & imaging results that were available during my care of the patient were reviewed by me and considered in my medical decision making (see chart for details).    MDM Rules/Calculators/A&P                          MDM  MSE complete  Jeremy [name] was evaluated in Emergency Department on 11/30/2020 for the symptoms described in the history of present illness. He was evaluated in the context of the global COVID-19 pandemic, which necessitated consideration that the patient might be at risk for  infection with the SARS-CoV-2 virus that causes COVID-19. Institutional protocols and algorithms that pertain to the evaluation of patients at risk for COVID-19 are in a state of rapid change based on information released by regulatory bodies including the CDC and federal and state organizations. These policies and algorithms were followed during the patient's care in the ED.  Patient is presenting with complaint of worsening fatigue, weakness, chills, subjective fevers.  Patient noted to be febrile in the ED with T-max of 102.  Broad-spectrum antibiotics initiated in the ED.  Patient is also noted to be anemic with a hemoglobin of 6.6.  Patient would benefit from admission for further work-up and treatment.  Hospitalist service is aware of case and will evaluate for admission.     Final Clinical Impression(s) / ED Diagnoses Final diagnoses:  None    Rx / DC Orders ED Discharge Orders     None        Valarie Merino, MD 11/30/20 [phone]

## 2020-11-30 NOTE — Progress Notes (Signed)
Pharmacy Antibiotic Note  Jeremy [name] is a 75 y.o. male admitted on 11/30/2020 with fever, weakness.  Pharmacy has been consulted for vancomycin + cefepime dosing.  Today, 11/30/20 -WBC elevated -SCr 1.19, CrCl ~61 mL/min  Plan: Cefepime 2 g IV q8h Vancomycin 1750 mg LD followed by 1500 mg IV q24h Goal vancomycin AUC 400-550 Follow renal function and culture data  Height: 6\' 2"  (188 cm) Weight: 83 kg (183 lb) IBW/kg (Calculated) : 82.2  Temp (24hrs), Avg:99.6 F (37.6 C), Min:97.8 F (36.6 C), Max:102 F (38.9 C)  Recent Labs  Lab 11/30/20 1627 11/30/20 1827  WBC 28.6*  --   CREATININE 1.19  --   LATICACIDVEN 5.0* 1.1    Estimated Creatinine Clearance: 61.4 mL/min (by C-G formula based on SCr of 1.19 mg/dL).    Allergies  Allergen Reactions   Naproxen Sodium Hives, Swelling, Rash and Other (See Comments)    "Made the face, lips, and mouth swell- had to go to the E.R."   Pembrolizumab Rash and Other (See Comments)    KEYTRUDA   Nsaids Hives, Swelling and Rash    Antimicrobials this admission: vancomycin 6/28 >>  cefepime 6/28 >>   Dose adjustments this admission:  Microbiology results: 6/28 BCx:  6/28 UCx:    Thank you for allowing pharmacy to be a part of this patient's care.  Lenis Noon, PharmD 11/30/2020 8:05 PM

## 2020-11-30 NOTE — ED Notes (Signed)
Date and time results received: 11/30/20 1741 (use smartphrase ".now" to insert current time)  Test: Lactic Acid Critical Value: 5.0  Name of Provider Notified: Dr. Francia Greaves  Orders Received? Or Actions Taken?:  See chart

## 2020-12-01 ENCOUNTER — Ambulatory Visit: Payer: Medicare Other

## 2020-12-01 ENCOUNTER — Inpatient Hospital Stay (HOSPITAL_COMMUNITY): Payer: Medicare Other

## 2020-12-01 LAB — HEMOGLOBIN AND HEMATOCRIT, BLOOD
HCT: 24.9 % — ABNORMAL LOW (ref 39.0–52.0)
Hemoglobin: 7.7 g/dL — ABNORMAL LOW (ref 13.0–17.0)

## 2020-12-01 LAB — CBC
HCT: 22.6 % — ABNORMAL LOW (ref 39.0–52.0)
Hemoglobin: 6.8 g/dL — CL (ref 13.0–17.0)
MCH: 30.9 pg (ref 26.0–34.0)
MCHC: 30.1 g/dL (ref 30.0–36.0)
MCV: 102.7 fL — ABNORMAL HIGH (ref 80.0–100.0)
Platelets: 293 10*3/uL (ref 150–400)
RBC: 2.2 MIL/uL — ABNORMAL LOW (ref 4.22–5.81)
RDW: 17.8 % — ABNORMAL HIGH (ref 11.5–15.5)
WBC: 24 10*3/uL — ABNORMAL HIGH (ref 4.0–10.5)
nRBC: 0 % (ref 0.0–0.2)

## 2020-12-01 LAB — PREPARE RBC (CROSSMATCH)

## 2020-12-01 LAB — BASIC METABOLIC PANEL
Anion gap: 9 (ref 5–15)
BUN: 21 mg/dL (ref 8–23)
CO2: 24 mmol/L (ref 22–32)
Calcium: 8.9 mg/dL (ref 8.9–10.3)
Chloride: 106 mmol/L (ref 98–111)
Creatinine, Ser: 0.94 mg/dL (ref 0.61–1.24)
GFR, Estimated: >60 mL/min (ref >60)
Glucose, Bld: 136 mg/dL — ABNORMAL HIGH (ref 70–99)
Potassium: 4.3 mmol/L (ref 3.5–5.1)
Sodium: 139 mmol/L (ref 135–145)

## 2020-12-01 LAB — GLUCOSE, CAPILLARY
Glucose-Capillary: 132 mg/dL — ABNORMAL HIGH (ref 70–99)
Glucose-Capillary: 132 mg/dL — ABNORMAL HIGH (ref 70–99)
Glucose-Capillary: 152 mg/dL — ABNORMAL HIGH (ref 70–99)
Glucose-Capillary: 88 mg/dL (ref 70–99)
Glucose-Capillary: 136 mg/dL — ABNORMAL HIGH (ref 70–99)

## 2020-12-01 LAB — MRSA PCR SCREENING: MRSA by PCR: NEGATIVE

## 2020-12-01 LAB — STREP PNEUMONIAE URINARY ANTIGEN: Strep Pneumo Urinary Antigen: NEGATIVE

## 2020-12-01 LAB — PROCALCITONIN: Procalcitonin: 0.99 ng/mL

## 2020-12-01 MED ORDER — SODIUM CHLORIDE (PF) 0.9 % IJ SOLN
INTRAMUSCULAR | Status: AC
  Filled 2020-12-01: qty 50

## 2020-12-01 MED ORDER — IOHEXOL 350 MG/ML SOLN
100.0000 mL | Freq: Once | INTRAVENOUS | Status: AC | PRN
  Administered 2020-12-01: 75 mL via INTRAVENOUS

## 2020-12-01 MED ORDER — FUROSEMIDE 10 MG/ML IJ SOLN
40.0000 mg | Freq: Two times a day (BID) | INTRAMUSCULAR | Status: DC
  Administered 2020-12-01 – 2020-12-05 (×8): 40 mg via INTRAVENOUS
  Filled 2020-12-01 (×8): qty 4

## 2020-12-01 MED ORDER — DOCUSATE SODIUM 50 MG PO CAPS
50.0000 mg | ORAL_CAPSULE | Freq: Once | ORAL | Status: AC
  Administered 2020-12-01: 50 mg via ORAL
  Filled 2020-12-01: qty 1

## 2020-12-01 NOTE — Care Management (Signed)
Urine sample & sputum sample were unable to be obtained this shift. Secretary notified Wilburn Cornelia) she called materials & no specimen cups available. I called lab-none available. Night shift RN Engineer, manufacturing notified of this issue. Will continue to search so that specimens can be collected.

## 2020-12-01 NOTE — Progress Notes (Signed)
Patient to room, alert and oriented x 4. On oxygen at 2 lpm/ spo2 of 98%. Patient is tachypneic, dyspnea on exertion and non productive cough noted with diminished breath sounds, breathing treatments done. Received with ongoing blood transfusion, completed without transfusion reaction.

## 2020-12-01 NOTE — Progress Notes (Addendum)
Bilateral wheezes and productive cough noted. Albuterol nebulization given, stopped IV fluids for now. NP made aware. Patient felt better but will hold the fluids for now as discussed with NP.

## 2020-12-01 NOTE — Progress Notes (Signed)
PROGRESS NOTE    Jeremy [name]  DZH:[id number] DOB: 05-09-1946 DOA: 11/30/2020 PCP: [name], MD   Brief Narrative:  HPI: Jeremy [name] is a 75 y.o. 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   Patient recently started palliative radiotherapy for his known lung cancer.  Over the last 4-5 days he has been having generalized weakness, cough productive of yellow sputum, fevers, diaphoresis, and chills.  He has not been able to attend his last 3 radiation treatments.  He has not had any dyspnea, chest pain, nausea, vomiting, abdominal pain, dysuria, or diarrhea.  He has had poor oral intake.  He reports occasional lightheadedness without syncope or fall.  He has not seen any obvious bleeding.   ED Course:  Initial vitals showed BP 104/65, pulse 113, RR 22, temp 97.8 F, SPO2 95% on room air.  T-max 102 F while in the ED.   Labs show WBC 28.6, hemoglobin 6.6, platelets 374,000, lactic acid 5.0, sodium 140, potassium 4.9, bicarb 27, BUN 28, creatinine 1.19, serum glucose 168, LFTs within normal limits, INR 1.1.   Urinalysis shows negative nitrates, negative leukocytes, 0-5 RBC/hpf, 6-10 WBC/hpf, rare bacteria microscopy.  Blood and urine cultures obtained and pending.  SARS-CoV-2 PCR in process.   Portable chest x-ray shows large left lower lobe cavitary lung mass as seen on prior with improved aeration on the left lung base.  Similar appearing right hemithorax volume loss with pleural thickening or loculated effusion and airspace disease in right mid to lower lung again noted.   Patient was  given 500 ccs normal saline, IV vancomycin and cefepime, and ordered to receive 1 unit PRBC transfusion.  The hospitalist service was consulted to admit for further evaluation and management.    Assessment & Plan:   Principal Problem:   Severe sepsis with lactic acidosis (HCC) Active Problems:   Type 2 diabetes mellitus (HCC)   HTN (hypertension)   AF (paroxysmal atrial fibrillation) (HCC)   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HCC)   Chronic respiratory failure with hypoxia (HCC)   Severe sepsis secondary to healthcare associated pneumonia, POA: Patient presented with fever, tachycardia, tachypnea, leukocytosis, and lactic acidosis likely secondary to healthcare associated pneumonia.  Patient meets healthcare associated pneumonia criteria based off of history of cancer getting regular radiation.  He feels better today.  COVID and influenza negative.  We will continue cefepime and vancomycin.  Discontinue Flagyl.  Chest x-ray also showed suspicion of possible empyema.  PE also cannot be ruled out.  We will proceed with CT angiogram of the chest to rule out PE and empyema.   Acute on chronic anemia/rectal bleeding: Denies active bleeding however earlier today, nurse found out streaks of blood on the surface of the stool when he had a large BM.  Based off of the pattern, likely hemorrhoidal.  Presented with hemoglobin 6.6.  2 units PRBC transfusion orders are in.  Will monitor closely.  If further rectal bleeding, will consult GI.  Often lower GI bleeding does not need colonoscopy unless there is persistent rectal bleeding.   Chronic respiratory failure with hypoxia: Uses 2 L  of oxygen at baseline.  He is at his baseline.   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 and radiation oncology, Dr.  Sondra Come.  Recently started on palliative radiotherapy.  Missed 3 doses.  Informed Dr. Julien Nordmann about his admission.   Paroxysmal atrial fibrillation: Not on anticoagulation due to transfusion dependent anemia.  Rate currently controlled, holding home Lopressor and diltiazem given soft blood pressures.   Type 2 diabetes: Continue to hold metformin.  Continue SSI.   Hypertension: Antihypertensives on hold due to soft blood pressures.   Psoriatic arthritis: Hold methotrexate.  DVT prophylaxis: SCDs Start: 11/30/20 1950   Code Status: DNR  Family Communication:  None present at bedside.  Plan of care discussed with patient in length and he verbalized understanding and agreed with it.  Status is: Inpatient  Remains inpatient appropriate because:Ongoing diagnostic testing needed not appropriate for outpatient work up  Dispo: The patient is from: Home              Anticipated d/c is to: Home              Patient currently is not medically stable to d/c.   Difficult to place patient No        Estimated body mass index is 23.5 kg/m as calculated from the following:   Height as of this encounter: 6\' 2"  (1.88 m).   Weight as of this encounter: 83 kg.      Nutritional status:               Consultants:  None  Procedures:  None  Antimicrobials:  Anti-infectives (From admission, onward)    Start     Dose/Rate Route Frequency Ordered Stop   12/01/20 1800  vancomycin (VANCOREADY) IVPB 1500 mg/300 mL        1,500 mg 150 mL/hr over 120 Minutes Intravenous Every 24 hours 11/30/20 2004     12/01/20 0000  ceFEPIme (MAXIPIME) 2 g in sodium chloride 0.9 % 100 mL IVPB        2 g 200 mL/hr over 30 Minutes Intravenous Every 8 hours 11/30/20 2005     11/30/20 2000  metroNIDAZOLE (FLAGYL) IVPB 500 mg        500 mg 100 mL/hr over 60 Minutes Intravenous Every 8 hours 11/30/20 1951     11/30/20 1630  vancomycin (VANCOREADY) IVPB 1750 mg/350 mL        1,750 mg 175 mL/hr over  120 Minutes Intravenous  Once 11/30/20 1626 11/30/20 2007   11/30/20 1630  ceFEPIme (MAXIPIME) 2 g in sodium chloride 0.9 % 100 mL IVPB        2 g 200 mL/hr over 30 Minutes Intravenous  Once 11/30/20 1626 11/30/20 1752          Subjective: Patient seen and examined.  He states that his breathing is better.  He is on 2 L of oxygen which is his baseline.  Looks comfortable.  Objective: Vitals:   12/01/20 0748 12/01/20 0930 12/01/20 1042 12/01/20 1146  BP: (!) 100/54 (!) 95/53 (!) 93/58 107/65  Pulse: 94 84 90 85  Resp: (!) 24 (!) 28 (!) 22 (!) 30  Temp: 97.7 F (36.5 C) 97.8 F (36.6 C) 98.6 F (37 C) 98 F (36.7 C)  TempSrc: Oral Oral Oral Oral  SpO2:  100% 100% 98%  Weight:      Height:        Intake/Output Summary (Last 24 hours) at 12/01/2020 1320 Last data filed at 12/01/2020 1105 Gross per 24 hour  Intake 2788.16 ml  Output 1250 ml  Net 1538.16 ml   Filed Weights   11/30/20 1540  Weight: 83 kg    Examination:  General exam: Appears calm and comfortable  Respiratory system: Diminished breath sounds at the right middle and lower lobe as well as left lower lobe, respiratory effort normal. Cardiovascular system: S1 & S2 heard, RRR. No JVD, murmurs, rubs, gallops or clicks. No pedal edema. Gastrointestinal system: Abdomen is nondistended, soft and nontender. No organomegaly or masses felt. Normal bowel sounds heard. Central nervous system: Alert and oriented. No focal neurological deficits. Extremities: Symmetric 5 x 5 power. Skin: No rashes, lesions or ulcers Psychiatry: Judgement and insight appear normal. Mood & affect appropriate.    Data Reviewed: I have personally reviewed following labs and imaging studies  CBC: Recent Labs  Lab 11/30/20 1627 12/01/20 0353  WBC 28.6* 24.0*  NEUTROABS 26.4*  --   HGB 6.6* 6.8*  HCT 21.9* 22.6*  MCV 104.8* 102.7*  PLT 374 478   Basic Metabolic Panel: Recent Labs  Lab 11/30/20 1627 12/01/20 0353  NA 140 139   K 4.9 4.3  CL 101 106  CO2 27 24  GLUCOSE 168* 136*  BUN 28* 21  CREATININE 1.19 0.94  CALCIUM 9.6 8.9   GFR: Estimated Creatinine Clearance: 77.7 mL/min (by C-G formula based on SCr of 0.94 mg/dL). Liver Function Tests: Recent Labs  Lab 11/30/20 1627  AST 18  ALT 19  ALKPHOS 72  BILITOT 0.4  PROT 7.0  ALBUMIN 2.4*   No results for input(s): LIPASE, AMYLASE in the last 168 hours. No results for input(s): AMMONIA in the last 168 hours. Coagulation Profile: Recent Labs  Lab 11/30/20 1627  INR 1.1   Cardiac Enzymes: No results for input(s): CKTOTAL, CKMB, CKMBINDEX, TROPONINI in the last 168 hours. BNP (last 3 results) No results for input(s): PROBNP in the last 8760 hours. HbA1C: No results for input(s): HGBA1C in the last 72 hours. CBG: Recent Labs  Lab 11/30/20 2241 12/01/20 0546 12/01/20 0707 12/01/20 1143  GLUCAP 176* 132* 132* 152*   Lipid Profile: No results for input(s): CHOL, HDL, LDLCALC, TRIG, CHOLHDL, LDLDIRECT in the last 72 hours. Thyroid Function Tests: No results for input(s): TSH, T4TOTAL, FREET4, T3FREE, THYROIDAB in the last 72 hours. Anemia Panel: No results for input(s): VITAMINB12, FOLATE, FERRITIN, TIBC, IRON, RETICCTPCT in the last 72 hours. Sepsis Labs: Recent Labs  Lab 11/30/20 1627 11/30/20 1827 12/01/20 0353  PROCALCITON  --   --  0.99  LATICACIDVEN 5.0* 1.1  --     Recent Results (from the past 240 hour(s))  Blood Culture (routine x 2)     Status: None (Preliminary result)   Collection Time: 11/30/20  4:27 PM   Specimen: BLOOD  Result Value Ref Range Status   Specimen Description   Final    BLOOD LEFT ANTECUBITAL Performed at [facility], [place] [address] [place], [place] [address]    Special Requests   Final    BOTTLES DRAWN AEROBIC AND ANAEROBIC Blood Culture adequate volume Performed at [facility] [address] [place], [place] [address]    Culture   Final    NO GROWTH <  12 HOURS Performed at [facility] [address] [place], [place]  [address]    Report Status PENDING  Incomplete  Blood Culture (routine x 2)     Status: None (Preliminary result)   Collection Time: 11/30/20  4:27 PM   Specimen: BLOOD  Result Value Ref Range Status   Specimen Description   Final    BLOOD RIGHT ANTECUBITAL Performed at [facility] [address], [place], [place] [address]    Special Requests   Final    BOTTLES DRAWN AEROBIC AND ANAEROBIC Blood Culture adequate volume Performed at [facility] [address] [place], [place] [address]    Culture   Final    NO GROWTH < 12 HOURS Performed at [facility] [address], [place], [place] [address]    Report Status PENDING  Incomplete  Resp Panel by RT-PCR (Flu A&B, Covid)     Status: None   Collection Time: 11/30/20  4:27 PM   Specimen: Nasopharyngeal(NP) swabs in vial transport medium  Result Value Ref Range Status   SARS Coronavirus 2 by RT PCR NEGATIVE NEGATIVE Final    Comment: (NOTE) SARS-CoV-2 target nucleic acids are NOT DETECTED.  The SARS-CoV-2 RNA is generally detectable in upper respiratory specimens during the acute phase of infection. The lowest concentration of SARS-CoV-2 viral copies this assay can detect is 138 copies/mL. A negative result does not preclude SARS-Cov-2 infection and should not be used as the sole basis for treatment or other patient management decisions. A negative result may occur with  improper specimen collection/handling, submission of specimen other than nasopharyngeal swab, presence of viral mutation(s) within the areas targeted by this assay, and inadequate number of viral copies(<138 copies/mL). A negative result must be combined with clinical observations, patient history, and epidemiological information. The expected result is Negative.  Fact Sheet for Patients:  EntrepreneurPulse.com.au  Fact  Sheet for Healthcare Providers:  IncredibleEmployment.be  This test is no t yet approved or cleared by the [place] FDA and  has been authorized for detection and/or diagnosis of SARS-CoV-2 by FDA under an Emergency Use Authorization (EUA). This EUA will remain  in effect (meaning this test can be used) for the duration of the COVID-19 declaration under Section 564(b)(1) of the Act, 21 U.S.C.section 360bbb-3(b)(1), unless the authorization is terminated  or revoked sooner.       Influenza A by PCR NEGATIVE NEGATIVE Final   Influenza B by PCR NEGATIVE NEGATIVE Final    Comment: (NOTE) The Xpert Xpress SARS-CoV-2/FLU/RSV plus assay is intended as an aid in the diagnosis of influenza from Nasopharyngeal swab specimens and should not be used as a sole basis for treatment. Nasal washings and aspirates are unacceptable for Xpert Xpress SARS-CoV-2/FLU/RSV testing.  Fact Sheet for Patients: EntrepreneurPulse.com.au  Fact Sheet for Healthcare Providers: IncredibleEmployment.be  This test is not yet approved or cleared by the [place] FDA and has been authorized for detection and/or diagnosis of SARS-CoV-2 by FDA under an Emergency Use Authorization (EUA). This EUA will remain in effect (meaning this test can be used) for the duration of the COVID-19 declaration under Section 564(b)(1) of the Act, 21 U.S.C. section 360bbb-3(b)(1), unless the authorization is terminated or revoked.  Performed at [facility], [place] [address] [place], [place] [address]       Radiology Studies: [facility] Chest Port 1 View  Result Date: 11/30/2020 CLINICAL DATA:  Fever weakness EXAM: PORTABLE CHEST 1 VIEW COMPARISON:  10/22/2020, CT 06/07/2020, 10/04/2020, PET CT 10/15/2020 FINDINGS: Large cavitary mass lesion in the  left lower lobe. Not much interval change in volume loss on the right with pleural thickening or loculated  effusion and airspace disease in the right mid to lower lung. Mild shift of mediastinal contents to the right. Partially obscured cardiomediastinal silhouette. No pneumothorax. IMPRESSION: 1. Large left lower lobe cavitary lung mass as before. Improved aeration on the left with better aeration of left lung base since May 2022 comparison 2. Not much interval change in right hemithorax volume loss, right pleural thickening or loculated effusion and airspace disease in the right mid to lower lung. Electronically Signed   By: Donavan Foil M.D.   On: 11/30/2020 16:22    Scheduled Meds:  furosemide  40 mg Intravenous BID   guaiFENesin  600 mg Oral BID   insulin aspart  0-9 Units Subcutaneous TID WC   sodium chloride flush  3 mL Intravenous Q12H   Continuous Infusions:  sodium chloride Stopped (11/30/20 1819)   ceFEPime (MAXIPIME) IV 2 g (12/01/20 0521)   metronidazole 500 mg (12/01/20 0300)   vancomycin       LOS: 1 day   Time spent: 36 minutes   Darliss Cheney, MD [facility]  12/01/2020, 1:20 PM   How to contact the [facility] Attending or Consulting provider [place] or covering provider during after hours [place], for this patient?  Check the care team in [facility] and look for a) attending/consulting TRH provider listed and b) the [facility] team listed. Page or secure chat [phone]. Log into [email or web address] and use [place]'s universal password to access. If you do not have the password, please contact the hospital operator. Locate the [facility] provider you are looking for under [facility] and page to a number that you can be directly reached. If you still have difficulty reaching the provider, please page the [facility] (Director on Call) for the Hospitalists listed on amion for assistance.

## 2020-12-02 ENCOUNTER — Ambulatory Visit: Payer: Medicare Other

## 2020-12-02 LAB — CBC WITH DIFFERENTIAL/PLATELET
Abs Immature Granulocytes: 0.46 10*3/uL — ABNORMAL HIGH (ref 0.00–0.07)
Basophils Absolute: 0 10*3/uL (ref 0.0–0.1)
Basophils Relative: 0 %
Eosinophils Absolute: 0.4 10*3/uL (ref 0.0–0.5)
Eosinophils Relative: 2 %
HCT: 24.9 % — ABNORMAL LOW (ref 39.0–52.0)
Hemoglobin: 7.7 g/dL — ABNORMAL LOW (ref 13.0–17.0)
Immature Granulocytes: 3 %
Lymphocytes Relative: 2 %
Lymphs Abs: 0.4 10*3/uL — ABNORMAL LOW (ref 0.7–4.0)
MCH: 30.6 pg (ref 26.0–34.0)
MCHC: 30.9 g/dL (ref 30.0–36.0)
MCV: 98.8 fL (ref 80.0–100.0)
Monocytes Absolute: 0.4 10*3/uL (ref 0.1–1.0)
Monocytes Relative: 2 %
Neutro Abs: 17.1 10*3/uL — ABNORMAL HIGH (ref 1.7–7.7)
Neutrophils Relative %: 91 %
Platelets: 279 10*3/uL (ref 150–400)
RBC: 2.52 MIL/uL — ABNORMAL LOW (ref 4.22–5.81)
RDW: 18 % — ABNORMAL HIGH (ref 11.5–15.5)
WBC: 18.7 10*3/uL — ABNORMAL HIGH (ref 4.0–10.5)
nRBC: 0 % (ref 0.0–0.2)

## 2020-12-02 LAB — TYPE AND SCREEN
ABO/RH(D): A POS
Antibody Screen: NEGATIVE
Unit division: 0
Unit division: 0

## 2020-12-02 LAB — GLUCOSE, CAPILLARY
Glucose-Capillary: 137 mg/dL — ABNORMAL HIGH (ref 70–99)
Glucose-Capillary: 189 mg/dL — ABNORMAL HIGH (ref 70–99)
Glucose-Capillary: 200 mg/dL — ABNORMAL HIGH (ref 70–99)

## 2020-12-02 LAB — BASIC METABOLIC PANEL
Anion gap: 14 (ref 5–15)
BUN: 17 mg/dL (ref 8–23)
CO2: 24 mmol/L (ref 22–32)
Calcium: 8.9 mg/dL (ref 8.9–10.3)
Chloride: 99 mmol/L (ref 98–111)
Creatinine, Ser: 0.87 mg/dL (ref 0.61–1.24)
GFR, Estimated: >60 mL/min (ref >60)
Glucose, Bld: 137 mg/dL — ABNORMAL HIGH (ref 70–99)
Potassium: 3 mmol/L — ABNORMAL LOW (ref 3.5–5.1)
Sodium: 137 mmol/L (ref 135–145)

## 2020-12-02 LAB — EXPECTORATED SPUTUM ASSESSMENT W GRAM STAIN, RFLX TO RESP C

## 2020-12-02 LAB — BPAM RBC
Blood Product Expiration Date: 202207252359
Blood Product Expiration Date: 202207252359
ISSUE DATE / TIME: 202206282039
ISSUE DATE / TIME: 202206290650
Unit Type and Rh: 6200
Unit Type and Rh: 6200

## 2020-12-02 LAB — PROCALCITONIN: Procalcitonin: 0.8 ng/mL

## 2020-12-02 LAB — MAGNESIUM: Magnesium: 1.3 mg/dL — ABNORMAL LOW (ref 1.7–2.4)

## 2020-12-02 MED ORDER — MELATONIN 3 MG PO TABS
3.0000 mg | ORAL_TABLET | Freq: Once | ORAL | Status: AC
  Administered 2020-12-02: 3 mg via ORAL
  Filled 2020-12-02: qty 1

## 2020-12-02 MED ORDER — POTASSIUM CHLORIDE CRYS ER 20 MEQ PO TBCR
40.0000 meq | EXTENDED_RELEASE_TABLET | Freq: Once | ORAL | Status: AC
  Administered 2020-12-02: 40 meq via ORAL
  Filled 2020-12-02: qty 2

## 2020-12-02 MED ORDER — MAGNESIUM SULFATE 2 GM/50ML IV SOLN
2.0000 g | Freq: Once | INTRAVENOUS | Status: AC
  Administered 2020-12-02: 2 g via INTRAVENOUS
  Filled 2020-12-02: qty 50

## 2020-12-02 MED ORDER — VANCOMYCIN HCL 1000 MG/200ML IV SOLN
1000.0000 mg | Freq: Two times a day (BID) | INTRAVENOUS | Status: DC
  Administered 2020-12-02 – 2020-12-05 (×6): 1000 mg via INTRAVENOUS
  Filled 2020-12-02 (×6): qty 200

## 2020-12-02 MED ORDER — METOPROLOL TARTRATE 25 MG PO TABS
25.0000 mg | ORAL_TABLET | Freq: Two times a day (BID) | ORAL | Status: DC
  Administered 2020-12-02 – 2020-12-06 (×9): 25 mg via ORAL
  Filled 2020-12-02 (×9): qty 1

## 2020-12-02 MED ORDER — ADULT MULTIVITAMIN W/MINERALS CH
1.0000 | ORAL_TABLET | Freq: Every day | ORAL | Status: DC
  Administered 2020-12-02 – 2020-12-08 (×7): 1 via ORAL
  Filled 2020-12-02 (×6): qty 1

## 2020-12-02 MED ORDER — ENSURE ENLIVE PO LIQD
237.0000 mL | ORAL | Status: DC
  Administered 2020-12-02 – 2020-12-08 (×5): 237 mL via ORAL

## 2020-12-02 MED ORDER — MAGNESIUM SULFATE IN D5W 1-5 GM/100ML-% IV SOLN
1.0000 g | Freq: Once | INTRAVENOUS | Status: AC
  Administered 2020-12-02: 1 g via INTRAVENOUS
  Filled 2020-12-02: qty 100

## 2020-12-02 NOTE — Significant Event (Signed)
Rapid Response Event Note   Reason for Call :  Elevated MEWS into red  Initial Focused Assessment:  Patient alert, oriented, and able to follow commands. Patient denies pain, but acknowledges generalized weakness, discomfort, and not feeling well. Patient has fever and was just given Tylenol by bedside RN.   Interventions:  Advised to apply ice packs to help bring fever down, utilize incentive spirometry.  Plan of Care:  Bedside RN will notify RR if no improvement.    Selinda Michaels, RN

## 2020-12-02 NOTE — Evaluation (Signed)
Occupational Therapy Evaluation Patient Details Name: Jeremy [name] MRN: [id number] DOB: 03-04-1946 Today's Date: 12/02/2020    History of Present Illness Patient admitted for sepsis. Jeremy [name] is a 75 y.o. 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     Patient recently started palliative radiotherapy for his known lung cancer. He has not been able to attend his last 3 radiation treatments. He reports occasional lightheadedness.   Clinical Impression   Jeremy [name] is a 75 year old man who presents with impaired balance, generalized weakness and decreased activity tolerance. Patient's wife reports a decline in functional abilities the last three weeks  - typically mostly independent with ADLs. Patient requiring mod assist for UB and LB dressing and activity limited to just chair transfer to due to HR up to 137 with just standing. Patient will benefit from skilled OT services while in hospital to improve deficits and learn compensatory strategies as needed in order to return PLOF.      Follow Up Recommendations  Home health OT    Equipment Recommendations  None recommended by OT    Recommendations for Other Services       Precautions / Restrictions Precautions Precautions: Fall Precaution Comments: hx of falls Restrictions Weight Bearing Restrictions: No      Mobility Bed Mobility Overal bed mobility: Needs Assistance Bed Mobility: Supine to Sit     Supine to sit: Min assist          Transfers Overall transfer level: Needs assistance Equipment used: Rolling walker (2  wheeled) Transfers: Sit to/from [facility] Sit to Stand: Min assist Stand pivot transfers: Min assist       General transfer comment: Min assist for steadying with RW. 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 HR up to 137 with standing. Activity limited to transfer to the chair.    Balance Overall balance assessment: Needs assistance Sitting-balance support: No upper extremity supported Sitting balance-Leahy Scale: Good     Standing balance support: During functional activity Standing balance-Leahy Scale: Poor                             ADL either performed or assessed with clinical judgement   ADL Overall ADL's : Needs assistance/impaired Eating/Feeding: Independent   Grooming: Set up;Sitting;Wash/dry face   Upper Body Bathing: Set up;Sitting   Lower Body Bathing: Minimal assistance;Sit to/from stand   Upper Body Dressing : Moderate assistance;Sitting   Lower Body Dressing: Moderate assistance;Sit to/from stand   Toilet Transfer: Minimal assistance;BSC;RW   Toileting- Clothing Manipulation and Hygiene: Minimal assistance;Sit to/from stand       Functional mobility during ADLs: Minimal assistance;Rolling walker       Vision Patient Visual Report: No change from baseline       Perception     Praxis      Pertinent Vitals/Pain Pain Assessment: No/denies pain     Hand Dominance Right   Extremity/Trunk Assessment Upper Extremity Assessment Upper Extremity Assessment: Overall WFL for tasks assessed   Lower Extremity Assessment Lower Extremity Assessment: Defer to PT evaluation   Cervical / Trunk Assessment  Cervical / Trunk Assessment: Normal   Communication Communication Communication: No difficulties   Cognition Arousal/Alertness: Awake/alert Behavior During Therapy: WFL for tasks assessed/performed Overall Cognitive Status: Within Functional Limits for tasks assessed                                 General  Comments: Oriented to self and year. Off on date and month.   General Comments       Exercises     Shoulder Instructions      Home Living Family/patient expects to be discharged to:: Private residence Living Arrangements: Spouse/significant other Available Help at Discharge: Family;Available 24 hours/day Type of Home: House Home Access: Stairs to enter [employer] of Steps: 2 Entrance Stairs-Rails: Right Home Layout: One level     Bathroom Shower/Tub: Occupational psychologist: Handicapped height Bathroom Accessibility: Yes   Home Equipment: [place] - single point;Shower seat - built in;Grab bars - tub/shower;Toilet riser;Other (comment);Walker - 2 wheels          Prior Functioning/Environment Level of Independence: Needs assistance  Gait / Transfers Assistance Needed: Spouse reports for the last three weeks she's been helping get up to the bathroom. Reports he typically uses a cane and has been holding on to the furniture. She uses a gait belt with him when he gets usnteady. ADL's / Homemaking Assistance Needed: Wife has been helping him with dressing, helps him in the shower   Comments: Reports for the last couple of weeks he's been in the bed 23 out of 24 hours except to get up to the bathroom.        OT Problem List: Decreased strength;Decreased activity tolerance;Impaired balance (sitting and/or standing);Decreased knowledge of use of DME or AE;Decreased safety awareness;Cardiopulmonary status limiting activity      OT Treatment/Interventions: Self-care/ADL training;Therapeutic exercise;DME and/or AE instruction;Therapeutic activities;Balance training;Patient/family education    OT Goals(Current goals can be found in the care plan section) Acute Rehab OT Goals Patient Stated Goal: to ambulate to bathroom OT Goal Formulation: With patient Time For Goal Achievement: 12/16/20 Potential to Achieve Goals: Good  OT Frequency: Min 2X/week   Barriers to  D/C:            Co-evaluation              AM-PAC OT "6 Clicks" Daily Activity     Outcome Measure Help from another person eating meals?: None Help from another person taking care of personal grooming?: A Little Help from another person toileting, which includes using toliet, bedpan, or urinal?: A Little Help from another person bathing (including washing, rinsing, drying)?: A Little Help from another person to put on and taking off regular upper body clothing?: A Lot Help from another person to put on and taking off regular lower body clothing?: A Lot 6 Click Score: 17   End of Session Equipment Utilized During Treatment: Rolling walker;Gait belt Nurse Communication: Mobility status  Activity Tolerance: Other (comment) (limited by hr) Patient left: in chair;with call bell/phone within reach;with family/visitor present  OT Visit Diagnosis: Unsteadiness on feet (R26.81);Muscle weakness (generalized) (M62.81)                Time: 3299-2426 OT Time Calculation (min): 23 min Charges:  OT General Charges $OT Visit: 1 Visit OT Evaluation $OT Eval Moderate Complexity: 1 Mod  Jeremy [name], OTR/L [place]  Office [phone] Pager: [place]  12/02/2020, 3:48 PM

## 2020-12-02 NOTE — Plan of Care (Signed)
°  Problem: Clinical Measurements: °Goal: Will remain free from infection °Outcome: Progressing °Goal: Respiratory complications will improve °Outcome: Progressing °  °

## 2020-12-02 NOTE — Progress Notes (Signed)
Patient converted to A-fib with RVR. Patient has history of A-fib. Patient asymptomatic. EKG obtained. Patient is now back in sinus rhythm

## 2020-12-02 NOTE — Progress Notes (Addendum)
PROGRESS NOTE    Jeremy [name]  VZD:[id number] DOB: [birth year] DOA: 11/30/2020 PCP: [name], MD   Brief Narrative:  Jeremy [name] is a 75 y.o. 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   Patient recently started palliative radiotherapy for his known lung cancer.  Over the last 4-5 days he has been having generalized weakness, cough productive of yellow sputum, fevers, diaphoresis, and chills.  He has not been able to attend his last 3 radiation treatments.  He has not had any dyspnea, chest pain, nausea, vomiting, abdominal pain, dysuria, or diarrhea.  He has had poor oral intake.  He reports occasional lightheadedness without syncope or fall.  He has not seen any obvious bleeding.   ED Course:  Initial vitals showed BP 104/65, pulse 113, RR 22, temp 97.8 F, SPO2 95% on room air.  T-max 102 F while in the ED.   Labs show WBC 28.6, hemoglobin 6.6, platelets 374,000, lactic acid 5.0, sodium 140, potassium 4.9, bicarb 27, BUN 28, creatinine 1.19, serum glucose 168, LFTs within normal limits, INR 1.1.   Urinalysis shows negative nitrates, negative leukocytes, 0-5 RBC/hpf, 6-10 WBC/hpf, rare bacteria microscopy.  Blood and urine cultures obtained and pending.  SARS-CoV-2 PCR in process.   Portable chest x-ray shows large left lower lobe cavitary lung mass as seen on prior with improved aeration on the left lung base.  Similar appearing right hemithorax volume loss with pleural thickening or loculated effusion and airspace disease in right mid to lower lung again noted.   Patient was given  500 ccs normal saline, IV vancomycin and cefepime, and ordered to receive 1 unit PRBC transfusion.  The hospitalist service was consulted to admit for further evaluation and management. Further hospital course as below.   Assessment & Plan:   Principal Problem:   Severe sepsis with lactic acidosis (HCC) Active Problems:   Type 2 diabetes mellitus (HCC)   HTN (hypertension)   AF (paroxysmal atrial fibrillation) (HCC)   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HCC)   Chronic respiratory failure with hypoxia (HCC)   Severe sepsis secondary to healthcare associated pneumonia, POA: Patient presented with fever, tachycardia, tachypnea, leukocytosis, and lactic acidosis likely secondary to healthcare associated pneumonia.  Patient meets healthcare associated pneumonia criteria based off of history of cancer getting regular radiation. COVID and influenza negative. Chest x-ray also showed suspicion of possible empyema.  CT angiogram ruled out PE and confirmed very small amount of pain by my in the right side and cavitary lesion in the left lower lobe which is chronic.  Patient feels better.  Has remained afebrile.  Leukocytosis improving.  Still requiring 2 L of oxygen which is his baseline.  Continue current antibiotics.  All the cultures are negative.   Acute on chronic anemia/rectal bleeding: Denies active bleeding however on 12/01/2020, nurse found out streaks of blood on the surface of the stool when he had a large BM.  Based off of the pattern, likely hemorrhoidal.  Presented with hemoglobin 6.6 and received 2 units of PRBC transfusion.  Hemoglobin stable at 7.7 since yesterday posttransfusion.  No further episodes of  bleeding.  No GI consult indicated.  Monitor closely.   Chronic respiratory failure with hypoxia: Uses 2 L of oxygen at baseline.  He is at his baseline.   Stage III/IV squamous cell carcinoma of the right lung with potential malignant right pleural effusion and metastatic  versus new primary stage IIIa squamous cell carcinoma of the left lower lung: Follows with oncology, [name] and radiation oncology, Dr. Sondra Come.  Recently started on palliative radiotherapy.  Missed 3 doses.  Informed Dr. Julien Nordmann about his admission.   Paroxysmal atrial fibrillation: Not on anticoagulation due to transfusion dependent anemia.  Rate slightly elevated this morning, will resume Lopressor but hold diltiazem.   Type 2 diabetes: Continue to hold metformin.  Continue SSI.   Hypertension: Antihypertensives on hold due to soft blood pressures.  But resuming beta-blocker as mentioned above.   Psoriatic arthritis: Hold methotrexate.  Hypomagnesemia/hypokalemia: Replace both of them.  Recheck tomorrow  DVT prophylaxis: SCDs Start: 11/30/20 1950   Code Status: DNR  Family Communication: Wife present at bedside.  Plan of care discussed with patient in length and he verbalized understanding and agreed with it.  Status is: Inpatient  Remains inpatient appropriate because:Ongoing diagnostic testing needed not appropriate for outpatient work up  Dispo: The patient is from: Home              Anticipated d/c is to: Home              Patient currently is not medically stable to d/c.   Difficult to place patient No   Estimated body mass index is 23.5 kg/m as calculated from the following:   Height as of this encounter: 6\' 2"  (1.88 m).   Weight as of this encounter: 83 kg.      Nutritional status:   Consultants:  None  Procedures:  None  Antimicrobials:  Anti-infectives (From admission, onward)    Start     Dose/Rate Route Frequency Ordered Stop   12/02/20 1200  vancomycin (VANCOREADY) IVPB 1000 mg/200 mL        1,000 mg 200 mL/hr over 60 Minutes Intravenous Every 12 hours 12/02/20 0954     12/01/20 1800  vancomycin (VANCOREADY) IVPB 1500 mg/300 mL  Status:  Discontinued        1,500 mg 150 mL/hr over 120 Minutes Intravenous Every 24 hours 11/30/20 2004 12/02/20  0954   12/01/20 0000  ceFEPIme (MAXIPIME) 2 g in sodium chloride 0.9 % 100 mL IVPB        2 g 200 mL/hr over 30 Minutes Intravenous Every 8 hours 11/30/20 2005     11/30/20 2000  metroNIDAZOLE (FLAGYL) IVPB 500 mg  Status:  Discontinued        500 mg 100 mL/hr over 60 Minutes Intravenous Every 8 hours 11/30/20 1951 12/01/20 1336   11/30/20 1630  vancomycin (VANCOREADY) IVPB 1750 mg/350 mL        1,750 mg 175 mL/hr over 120 Minutes Intravenous  Once 11/30/20 1626 11/30/20 2007   11/30/20 1630  ceFEPIme (MAXIPIME) 2 g in sodium chloride 0.9 % 100 mL IVPB        2 g 200 mL/hr over 30 Minutes Intravenous  Once 11/30/20 1626 11/30/20 1752          Subjective: Seen and examined.  He feels better.  Wife at the bedside.  No new complaint.  Objective: Vitals:   12/02/20 0339 12/02/20 0450 12/02/20 0608 12/02/20 0913  BP: 104/62 111/72 100/67 105/61  Pulse: 97  84 97 (!) 101  Resp: 18 18 18 20   Temp: 99.8 F (37.7 C) (!) 97.5 F (36.4 C) 97.7 F (36.5 C) 98 F (36.7 C)  TempSrc: Rectal  Oral Axillary  SpO2: 96% 99% 91% 99%  Weight:      Height:        Intake/Output Summary (Last 24 hours) at 12/02/2020 1055 Last data filed at 12/02/2020 0916 Gross per 24 hour  Intake 1960.13 ml  Output 4500 ml  Net -2539.87 ml    Filed Weights   11/30/20 1540  Weight: 83 kg    Examination:  General exam: Appears calm and comfortable  Respiratory system: Diminished breath sounds at the bases bilaterally. Respiratory effort normal. Cardiovascular system: S1 & S2 heard, RRR. No JVD, murmurs, rubs, gallops or clicks. No pedal edema. Gastrointestinal system: Abdomen is nondistended, soft and nontender. No organomegaly or masses felt. Normal bowel sounds heard. Central nervous system: Alert and oriented. No focal neurological deficits. Extremities: Symmetric 5 x 5 power. Skin: No rashes, lesions or ulcers.  Psychiatry: Judgement and insight appear normal. Mood & affect appropriate.   Data  Reviewed: I have personally reviewed following labs and imaging studies  CBC: Recent Labs  Lab 11/30/20 1627 12/01/20 0353 12/01/20 1602 12/02/20 0359  WBC 28.6* 24.0*  --  18.7*  NEUTROABS 26.4*  --   --  17.1*  HGB 6.6* 6.8* 7.7* 7.7*  HCT 21.9* 22.6* 24.9* 24.9*  MCV 104.8* 102.7*  --  98.8  PLT 374 293  --  371    Basic Metabolic Panel: Recent Labs  Lab 11/30/20 1627 12/01/20 0353 12/02/20 0359  NA 140 139 137  K 4.9 4.3 3.0*  CL 101 106 99  CO2 27 24 24   GLUCOSE 168* 136* 137*  BUN 28* 21 17  CREATININE 1.19 0.94 0.87  CALCIUM 9.6 8.9 8.9  MG  --   --  1.3*    GFR: Estimated Creatinine Clearance: 84 mL/min (by C-G formula based on SCr of 0.87 mg/dL). Liver Function Tests: Recent Labs  Lab 11/30/20 1627  AST 18  ALT 19  ALKPHOS 72  BILITOT 0.4  PROT 7.0  ALBUMIN 2.4*    No results for input(s): LIPASE, AMYLASE in the last 168 hours. No results for input(s): AMMONIA in the last 168 hours. Coagulation Profile: Recent Labs  Lab 11/30/20 1627  INR 1.1    Cardiac Enzymes: No results for input(s): CKTOTAL, CKMB, CKMBINDEX, TROPONINI in the last 168 hours. BNP (last 3 results) No results for input(s): PROBNP in the last 8760 hours. HbA1C: No results for input(s): HGBA1C in the last 72 hours. CBG: Recent Labs  Lab 12/01/20 0707 12/01/20 1143 12/01/20 1641 12/01/20 2203 12/02/20 0746  GLUCAP 132* 152* 88 136* 137*    Lipid Profile: No results for input(s): CHOL, HDL, LDLCALC, TRIG, CHOLHDL, LDLDIRECT in the last 72 hours. Thyroid Function Tests: No results for input(s): TSH, T4TOTAL, FREET4, T3FREE, THYROIDAB in the last 72 hours. Anemia Panel: No results for input(s): VITAMINB12, FOLATE, FERRITIN, TIBC, IRON, RETICCTPCT in the last 72 hours. Sepsis Labs: Recent Labs  Lab 11/30/20 1627 11/30/20 1827 12/01/20 0353 12/02/20 0359  PROCALCITON  --   --  0.99 0.80  LATICACIDVEN 5.0* 1.1  --   --      Recent Results (from the past 240  hour(s))  Urine culture     Status: Abnormal (Preliminary result)   Collection Time: 11/30/20  4:27 PM   Specimen: In/Out Cath Urine  Result Value Ref Range Status   Specimen Description   Final    IN/OUT CATH URINE Performed at [facility] [address] [place], [place] [address]    Special Requests   Final    NONE Performed at [facility], [place] [address], [place], [place] [address]    Culture 50,000 COLONIES/mL GRAM NEGATIVE RODS (A)  Final   Report Status PENDING  Incomplete  Blood Culture (routine x 2)     Status: None (Preliminary result)   Collection Time: 11/30/20  4:27 PM   Specimen: BLOOD  Result Value Ref Range Status   Specimen Description   Final    BLOOD LEFT ANTECUBITAL Performed at [facility] [address] [place], [place] [address]    Special Requests   Final    BOTTLES DRAWN AEROBIC AND ANAEROBIC Blood Culture adequate volume Performed at [facility] [address] [place], [place] [address]    Culture   Final    NO GROWTH 2 DAYS Performed at [facility] [address], [place], [place] [address]    Report Status PENDING  Incomplete  Blood Culture (routine x 2)     Status: None (Preliminary result)   Collection Time: 11/30/20  4:27 PM   Specimen: BLOOD  Result Value Ref Range Status   Specimen Description   Final    BLOOD RIGHT ANTECUBITAL Performed at [facility] [address], [place], [place] [address]    Special Requests   Final    BOTTLES DRAWN AEROBIC AND ANAEROBIC Blood Culture adequate volume Performed at [facility] [address] [place], [place] [address]    Culture   Final    NO GROWTH 2 DAYS Performed at [facility] [address], [place],  [address]    Report Status PENDING  Incomplete  Resp Panel by RT-PCR (Flu A&B, Covid)     Status: None   Collection Time: 11/30/20  4:27 PM    Specimen: Nasopharyngeal(NP) swabs in vial transport medium  Result Value Ref Range Status   SARS Coronavirus 2 by RT PCR NEGATIVE NEGATIVE Final    Comment: (NOTE) SARS-CoV-2 target nucleic acids are NOT DETECTED.  The SARS-CoV-2 RNA is generally detectable in upper respiratory specimens during the acute phase of infection. The lowest concentration of SARS-CoV-2 viral copies this assay can detect is 138 copies/mL. A negative result does not preclude SARS-Cov-2 infection and should not be used as the sole basis for treatment or other patient management decisions. A negative result may occur with  improper specimen collection/handling, submission of specimen other than nasopharyngeal swab, presence of viral mutation(s) within the areas targeted by this assay, and inadequate number of viral copies(<138 copies/mL). A negative result must be combined with clinical observations, patient history, and epidemiological information. The expected result is Negative.  Fact Sheet for Patients:  EntrepreneurPulse.com.au  Fact Sheet for Healthcare Providers:  IncredibleEmployment.be  This test is no t yet approved or cleared by the [place] FDA and  has been authorized for detection and/or diagnosis of SARS-CoV-2 by FDA under an Emergency Use Authorization (EUA). This EUA will remain  in effect (meaning this test can be used) for the duration of the COVID-19 declaration under Section 564(b)(1) of the Act, 21 U.S.C.section 360bbb-3(b)(1), unless the authorization is terminated  or revoked sooner.       Influenza A by PCR NEGATIVE NEGATIVE Final   Influenza  B by PCR NEGATIVE NEGATIVE Final    Comment: (NOTE) The Xpert Xpress SARS-CoV-2/FLU/RSV plus assay is intended as an aid in the diagnosis of influenza from Nasopharyngeal swab specimens and should not be used as a sole basis for treatment. Nasal washings and aspirates are unacceptable for Xpert  Xpress SARS-CoV-2/FLU/RSV testing.  Fact Sheet for Patients: EntrepreneurPulse.com.au  Fact Sheet for Healthcare Providers: IncredibleEmployment.be  This test is not yet approved or cleared by the [place] FDA and has been authorized for detection and/or diagnosis of SARS-CoV-2 by FDA under an Emergency Use Authorization (EUA). This EUA will remain in effect (meaning this test can be used) for the duration of the COVID-19 declaration under Section 564(b)(1) of the Act, 21 U.S.C. section 360bbb-3(b)(1), unless the authorization is terminated or revoked.  Performed at [facility], [place] [address] [place], [place] [address]   MRSA PCR Screening     Status: None   Collection Time: 12/01/20  8:00 PM  Result Value Ref Range Status   MRSA by PCR NEGATIVE NEGATIVE Final    Comment:        The GeneXpert MRSA Assay (FDA approved for NASAL specimens only), is one component of a comprehensive MRSA colonization surveillance program. It is not intended to diagnose MRSA infection nor to guide or monitor treatment for MRSA infections. Performed at [facility], [place] [address] [place], [place] [address]   Expectorated Sputum Assessment w Gram Stain, Rflx to Resp Cult     Status: None   Collection Time: 12/02/20  6:30 AM   Specimen: Expectorated Sputum  Result Value Ref Range Status   Specimen Description EXPECTORATED SPUTUM  Final   Special Requests NONE  Final   Sputum evaluation   Final    Sputum specimen not acceptable for testing.  Please recollect.   INFORM COMMING K. RN  ON 12/02/2020 @ 0706 BY MECIAL J. Performed at [facility], [place] [address] [place], [place] [address]    Report Status 12/02/2020 FINAL  Final       Radiology Studies: CT Angio Chest Pulmonary Embolism (PE) W or WO Contrast  Result Date: 12/01/2020 CLINICAL DATA:  75 year old male with concern for pulmonary  embolism. History of non-small cell lung cancer status post radiation and immunotherapy and ongoing chemotherapy. EXAM: CT ANGIOGRAPHY CHEST WITH CONTRAST TECHNIQUE: Multidetector CT imaging of the chest was performed using the standard protocol during bolus administration of intravenous contrast. Multiplanar CT image reconstructions and MIPs were obtained to evaluate the vascular anatomy. CONTRAST:  36mL OMNIPAQUE IOHEXOL 350 MG/ML SOLN COMPARISON:  Chest CT dated 06/07/2020. chest radiograph dated 11/30/2020. FINDINGS: Cardiovascular: There is no cardiomegaly. Small pericardial effusion similar to prior CT. Mild atherosclerotic calcification of the thoracic aorta. No aneurysmal dilatation or dissection. Evaluation of the pulmonary arteries is limited due to respiratory motion artifact and suboptimal visualization of the peripheral branches. No large or central pulmonary artery embolus identified. Mediastinum/Nodes: Mildly enlarged left hilar lymph node measures approximately 2 cm. Evaluation of the right hilum and mediastinum is limited due to consolidative changes of the right lung. The esophagus is grossly unremarkable. No mediastinal fluid collection. Lungs/Pleura: There is a large area of consolidative change with air bronchogram involving the right lung and predominantly right middle and right lower lobe, likely post treatment changes. There is overall decreased right lung volume with slight shift of the mediastinum to the right of the midline. Small loculated appearing right pleural effusion. There is a 5 x  7 cm fluid-filled cavitary mass in the superior segment of the left lower lobe containing small pockets of air. There is a small left pleural effusion. There is a 9 mm left upper lobe nodule with irregular margins (49/10), minimally decreased since the prior CT (previously measuring approximately 10 mm. There is no pneumothorax. There is high-grade narrowing of the bronchus intermedius. The central  airways are otherwise patent. Upper Abdomen: No acute abnormality. Musculoskeletal: No chest wall abnormality. No acute or significant osseous findings. Review of the MIP images confirms the above findings. IMPRESSION: 1. No CT evidence of central pulmonary artery embolus. 2. Large area of consolidative change with air bronchogram involving the right lung, likely post treatment changes. 3. A 5 x 7 cm fluid-filled cavitary lesion in the left lower lobe. This may represent a necrotic mass/neoplasm or an infectious process. 4. Mildly enlarged left hilar lymph node. 5. Small loculated appearing right pleural effusion. 6. Small left pleural effusion. 7. Small pericardial effusion similar to prior CT. 8. A 9 mm left upper lobe nodule, minimally decreased in size since the prior CT. 9. Aortic Atherosclerosis (ICD10-I70.0). Electronically Signed   By: Anner Crete M.D.   On: 12/01/2020 15:54   DG Chest Port 1 View  Result Date: 11/30/2020 CLINICAL DATA:  Fever weakness EXAM: PORTABLE CHEST 1 VIEW COMPARISON:  10/22/2020, CT 06/07/2020, 10/04/2020, PET CT 10/15/2020 FINDINGS: Large cavitary mass lesion in the left lower lobe. Not much interval change in volume loss on the right with pleural thickening or loculated effusion and airspace disease in the right mid to lower lung. Mild shift of mediastinal contents to the right. Partially obscured cardiomediastinal silhouette. No pneumothorax. IMPRESSION: 1. Large left lower lobe cavitary lung mass as before. Improved aeration on the left with better aeration of left lung base since May 2022 comparison 2. Not much interval change in right hemithorax volume loss, right pleural thickening or loculated effusion and airspace disease in the right mid to lower lung. Electronically Signed   By: Donavan Foil M.D.   On: 11/30/2020 16:22    Scheduled Meds:  furosemide  40 mg Intravenous BID   guaiFENesin  600 mg Oral BID   insulin aspart  0-9 Units Subcutaneous TID WC    potassium chloride  40 mEq Oral Once   sodium chloride flush  3 mL Intravenous Q12H   Continuous Infusions:  sodium chloride Stopped (11/30/20 1819)   ceFEPime (MAXIPIME) IV 2 g (12/02/20 0610)   vancomycin       LOS: 2 days   Time spent: 30 minutes   Darliss Cheney, MD [facility]  12/02/2020, 10:55 AM   How to contact the [facility] Attending or Consulting provider [place] or covering provider during after hours [place], for this patient?  Check the care team in [facility] and look for a) attending/consulting [facility] provider listed and b) the [facility] team listed. Page or secure chat [phone]. Log into [email or web address] and use [place]'s universal password to access. If you do not have the password, please contact the hospital operator. Locate the [facility] provider you are looking for under [facility] and page to a number that you can be directly reached. If you still have difficulty reaching the provider, please page the [facility] (Director on Call) for the Hospitalists listed on amion for assistance.

## 2020-12-02 NOTE — Progress Notes (Signed)
Pharmacy Antibiotic Note  Jeremy [name] is a 75 y.o. male with hx on 11/30/2020 with squamous cell carcinoma  of right lung presented to the ED on 6/28 with c/o generalized weakness, loss of appetite and fever.  He was started on vancomycin and cefepime on admission for sepsis.  Today, 12/02/2020: - day #2 abx - Tmax 101.7, wbc elevated but down to 18.7 - scr down 0.87 (crcl~84)  6/28 cefepime>> 6/28 vanc>> 6/28 flagyl>>   6/28 ucx: 50K GNR 6/28 bcx x2:  Plan: - continue cefepime 2 gm q8h - adjust vancomycin to 1000 mg IV q12h for est AUC 452 - f/u renal function, culture results, clinical status ______________________________________  Height: 6\' 2"  (188 cm) Weight: 83 kg (183 lb) IBW/kg (Calculated) : 82.2  Temp (24hrs), Avg:99 F (37.2 C), Min:97.5 F (36.4 C), Max:101.7 F (38.7 C)  Recent Labs  Lab 11/30/20 1627 11/30/20 1827 12/01/20 0353 12/02/20 0359  WBC 28.6*  --  24.0* 18.7*  CREATININE 1.19  --  0.94 0.87  LATICACIDVEN 5.0* 1.1  --   --     Estimated Creatinine Clearance: 84 mL/min (by C-G formula based on SCr of 0.87 mg/dL).    Allergies  Allergen Reactions   Naproxen Sodium Hives, Swelling, Rash and Other (See Comments)    "Made the face, lips, and mouth swell- had to go to the E.R."   Pembrolizumab Rash and Other (See Comments)    KEYTRUDA   Chlorhexidine     No central line   Nsaids Hives, Swelling and Rash     Thank you for allowing pharmacy to be a part of this patient's care.  Lynelle Doctor 12/02/2020 9:44 AM

## 2020-12-02 NOTE — Progress Notes (Signed)
Initial Nutrition Assessment  DOCUMENTATION CODES:   Not applicable  INTERVENTION:  - Ensure Enlive po BID, each supplement provides 350 kcal and 20 grams of protein - will order 1 tablet multivitamin with minerals/day. - complete NFPE when feasible. - consider trial of appetite stimulant.    NUTRITION DIAGNOSIS:   Increased nutrient needs related to acute illness, chronic illness, cancer and cancer related treatments as evidenced by estimated needs.  GOAL:   Patient will meet greater than or equal to 90% of their needs  MONITOR:   PO intake, Supplement acceptance, Labs, Weight trends  REASON FOR ASSESSMENT:   Malnutrition Screening Tool  ASSESSMENT:   75 y.o. 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 He presented to the ED d/t fevers, generalized weakness, productive cough, and chills. He was unable to attend his 3 most recent XRT treatments.  Meal intakes yesterday were 95% of breakfast, 50% of lunch, and 30% of dinner. He ate 25% of breakfast and visualized lunch tray with ~10-25% completion.   Patient sleeping throughout entirety of RD visit. Wife was at bedside and provided all information. Patient had back surgery in 08/2020 and after was given decadron taper. While on this medication patient had a very good appetite and wife was "unable to keep him full". Since this medication was tapered off he has had a decreasing/poor appetite (~6 weeks).  Most days he has a scrambled egg with cheese or waffle and fruit for breakfast, has an Ensure or Boost for lunch, and will eat a sandwich (luncheon meat, liver pudding, or PB) or something similar for dinner.   Wife is interested in  him receiving Ensure once/day during admission.   He has no chewing or swallowing difficulties or pain that wife is aware of. He does experience early satiety and will begin to dry heave if he eats more than he feels he is able to.   She reports that he has peripheral neuropathy and chronic anemia and is often dizzy or feels unsteady when trying to ambulate. For the past few weeks he has not been agreeable to getting out of bed other than to go to the bathroom.  Weight on 6/28 was 183 lb and wife reports that UBW is 200 lb. Weight on 5/19 was 188 lb which indicates 5 lb weight loss (2.6% body weight) in the past 5 weeks; not significant for time frame.    Labs reviewed; CBGs: 137 and 189 mg/dl, K: 3 mmol/l, Mg: 1.3 mg/dl.  Medications reviewed; sliding scale novolog, 1 g IV Mg sulfate x1 run 6/30, 2 g IV Mg sulfate x1, 40 mEq Klor-Con once/day, 40 mg IV lasix/day.    NUTRITION - FOCUSED PHYSICAL EXAM:  Unable to complete at this time.   Diet Order:   Diet Order             Diet heart healthy/carb modified Room service appropriate? Yes; Fluid consistency: Thin  Diet effective now                   EDUCATION NEEDS:   No education needs have been identified at this time  Skin:  Skin Assessment: Reviewed RN Assessment  Last BM:  6/29  Height:   Ht  Readings from Last 1 Encounters:  11/30/20 6\' 2"  (1.88 m)    Weight:   Wt Readings from Last 1 Encounters:  11/30/20 83 kg      Estimated Nutritional Needs:  Kcal:  2200-2400 kcal Protein:  110-125 grams Fluid:  >/= 2.3 L/day      Jeremy Matin, MS, RD, LDN, CNSC Inpatient Clinical Dietitian RD pager # available in [facility]  After hours/weekend pager # available in [facility]

## 2020-12-03 ENCOUNTER — Ambulatory Visit: Payer: Medicare Other

## 2020-12-03 LAB — MAGNESIUM: Magnesium: 1.8 mg/dL (ref 1.7–2.4)

## 2020-12-03 LAB — CBC WITH DIFFERENTIAL/PLATELET
Abs Immature Granulocytes: 0.3 10*3/uL — ABNORMAL HIGH (ref 0.00–0.07)
Basophils Absolute: 0 10*3/uL (ref 0.0–0.1)
Basophils Relative: 0 %
Eosinophils Absolute: 0.4 10*3/uL (ref 0.0–0.5)
Eosinophils Relative: 2 %
HCT: 28.8 % — ABNORMAL LOW (ref 39.0–52.0)
Hemoglobin: 8.8 g/dL — ABNORMAL LOW (ref 13.0–17.0)
Immature Granulocytes: 2 %
Lymphocytes Relative: 3 %
Lymphs Abs: 0.6 10*3/uL — ABNORMAL LOW (ref 0.7–4.0)
MCH: 30.3 pg (ref 26.0–34.0)
MCHC: 30.6 g/dL (ref 30.0–36.0)
MCV: 99.3 fL (ref 80.0–100.0)
Monocytes Absolute: 0.5 10*3/uL (ref 0.1–1.0)
Monocytes Relative: 3 %
Neutro Abs: 16.9 10*3/uL — ABNORMAL HIGH (ref 1.7–7.7)
Neutrophils Relative %: 90 %
Platelets: 274 10*3/uL (ref 150–400)
RBC: 2.9 MIL/uL — ABNORMAL LOW (ref 4.22–5.81)
RDW: 17.5 % — ABNORMAL HIGH (ref 11.5–15.5)
WBC: 18.8 10*3/uL — ABNORMAL HIGH (ref 4.0–10.5)
nRBC: 0 % (ref 0.0–0.2)

## 2020-12-03 LAB — GLUCOSE, CAPILLARY
Glucose-Capillary: 129 mg/dL — ABNORMAL HIGH (ref 70–99)
Glucose-Capillary: 138 mg/dL — ABNORMAL HIGH (ref 70–99)
Glucose-Capillary: 142 mg/dL — ABNORMAL HIGH (ref 70–99)
Glucose-Capillary: 190 mg/dL — ABNORMAL HIGH (ref 70–99)

## 2020-12-03 LAB — URINE CULTURE: Culture: 50000 — AB

## 2020-12-03 LAB — BASIC METABOLIC PANEL
Anion gap: 11 (ref 5–15)
BUN: 25 mg/dL — ABNORMAL HIGH (ref 8–23)
CO2: 27 mmol/L (ref 22–32)
Calcium: 9.1 mg/dL (ref 8.9–10.3)
Chloride: 99 mmol/L (ref 98–111)
Creatinine, Ser: 0.94 mg/dL (ref 0.61–1.24)
GFR, Estimated: >60 mL/min (ref >60)
Glucose, Bld: 148 mg/dL — ABNORMAL HIGH (ref 70–99)
Potassium: 3.5 mmol/L (ref 3.5–5.1)
Sodium: 137 mmol/L (ref 135–145)

## 2020-12-03 LAB — LEGIONELLA PNEUMOPHILA SEROGP 1 UR AG: L. pneumophila Serogp 1 Ur Ag: NEGATIVE

## 2020-12-03 NOTE — Progress Notes (Signed)
PROGRESS NOTE    Jeremy [name]  HGD:[id number] DOB: 10-Aug-1945 DOA: 11/30/2020 PCP: [name], MD   Brief Narrative:  Jeremy [name] is a 75 y.o. 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   Patient recently started palliative radiotherapy for his known lung cancer.  Over the last 4-5 days he has been having generalized weakness, cough productive of yellow sputum, fevers, diaphoresis, and chills.  He has not been able to attend his last 3 radiation treatments.  He has not had any dyspnea, chest pain, nausea, vomiting, abdominal pain, dysuria, or diarrhea.  He has had poor oral intake.  He reports occasional lightheadedness without syncope or fall.  He has not seen any obvious bleeding.   ED Course:  Initial vitals showed BP 104/65, pulse 113, RR 22, temp 97.8 F, SPO2 95% on room air.  T-max 102 F while in the ED.   Labs show WBC 28.6, hemoglobin 6.6, platelets 374,000, lactic acid 5.0, sodium 140, potassium 4.9, bicarb 27, BUN 28, creatinine 1.19, serum glucose 168, LFTs within normal limits, INR 1.1.   Urinalysis shows negative nitrates, negative leukocytes, 0-5 RBC/hpf, 6-10 WBC/hpf, rare bacteria microscopy.  Blood and urine cultures obtained and pending.  SARS-CoV-2 PCR in process.   Portable chest x-ray shows large left lower lobe cavitary lung mass as seen on prior with improved aeration on the left lung base.  Similar appearing right hemithorax volume loss with pleural thickening or loculated effusion and airspace disease in right mid to lower lung again noted.   Patient was given  500 ccs normal saline, IV vancomycin and cefepime, and ordered to receive 1 unit PRBC transfusion.  The hospitalist service was consulted to admit for further evaluation and management. Further hospital course as below.   Assessment & Plan:   Principal Problem:   Severe sepsis with lactic acidosis (HCC) Active Problems:   Type 2 diabetes mellitus (HCC)   HTN (hypertension)   AF (paroxysmal atrial fibrillation) (HCC)   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HCC)   Chronic respiratory failure with hypoxia (HCC)   Severe sepsis secondary to healthcare associated pneumonia, POA: Patient presented with fever, tachycardia, tachypnea, leukocytosis, and lactic acidosis likely secondary to healthcare associated pneumonia.  Patient meets healthcare associated pneumonia criteria based off of history of cancer getting regular radiation. COVID and influenza negative. Chest x-ray also showed suspicion of possible empyema.  CT angiogram ruled out PE and confirmed very small amount of pain by my in the right side and cavitary lesion in the left lower lobe which is chronic.  Patient himself feels better as far as breathing goes but he continues to feel weak and was equivocal when asked about if he would like to go home.  He was not opposed to go home however his wife was not comfortable for him to go home as she thinks that he looks much weaker than his baseline.  Has remained afebrile.  Leukocytosis stable.  Still requiring 2 L of oxygen which is his baseline.  Continue current antibiotics.  All the cultures are negative.   Acute on chronic anemia/rectal bleeding: Denies active  bleeding however on 12/01/2020, nurse found out streaks of blood on the surface of the stool when he had a large BM.  Based off of the pattern, likely hemorrhoidal which does not need any intervention/colonoscopy at this point in time.  Presented with hemoglobin 6.6 and received 2 units of PRBC transfusion.  Hemoglobin improved to  7.7 posttransfusion and in fact has improved today to 8.8 without any transfusion.   Chronic respiratory failure with hypoxia: Uses 2 L of oxygen at baseline.  He is at his baseline.   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 and radiation oncology, Dr. Sondra Come.  Recently started on palliative radiotherapy.  Missed 3 doses.  Informed Dr. Julien Nordmann about his admission.   Paroxysmal atrial fibrillation: Not on anticoagulation due to transfusion dependent anemia.  Rates intermittently elevated.  Continue Lopressor.  Holding diltiazem due to soft blood pressure.   Type 2 diabetes: Continue to hold metformin.  Continue SSI.   Hypertension: Antihypertensives on hold due to soft blood pressures.    Psoriatic arthritis: Hold methotrexate.  Hypomagnesemia/hypokalemia: Resolved  DVT prophylaxis: SCDs Start: 11/30/20 1950   Code Status: DNR  Family Communication: Wife present at bedside.  Plan of care discussed with patient in length and he verbalized understanding and agreed with it.  Status is: Inpatient  Remains inpatient appropriate because:Ongoing diagnostic testing needed not appropriate for outpatient work up  Dispo: The patient is from: Home              Anticipated d/c is to: Home              Patient currently is not medically stable to d/c.   Difficult to place patient No   Estimated body mass index is 23.5 kg/m as calculated from the following:   Height as of this encounter: 6\' 2"  (1.88 m).   Weight as of this encounter: 83 kg.      Nutritional status:   Consultants:  None  Procedures:  None  Antimicrobials:  Anti-infectives (From admission, onward)    Start     Dose/Rate Route Frequency Ordered Stop   12/02/20 1200  vancomycin (VANCOREADY) IVPB 1000 mg/200 mL        1,000  mg 200 mL/hr over 60 Minutes Intravenous Every 12 hours 12/02/20 0954     12/01/20 1800  vancomycin (VANCOREADY) IVPB 1500 mg/300 mL  Status:  Discontinued        1,500 mg 150 mL/hr over 120 Minutes Intravenous Every 24 hours 11/30/20 2004 12/02/20 0954   12/01/20 0000  ceFEPIme (MAXIPIME) 2 g in sodium chloride 0.9 % 100 mL IVPB        2 g 200 mL/hr over 30 Minutes Intravenous Every 8 hours 11/30/20 2005     11/30/20 2000  metroNIDAZOLE (FLAGYL) IVPB 500 mg  Status:  Discontinued        500 mg 100 mL/hr over 60 Minutes Intravenous Every 8 hours 11/30/20 1951 12/01/20 1336   11/30/20 1630  vancomycin (VANCOREADY) IVPB 1750 mg/350 mL        1,750 mg 175 mL/hr over 120 Minutes Intravenous  Once 11/30/20 1626 11/30/20 2007   11/30/20 1630  ceFEPIme (MAXIPIME) 2 g in sodium chloride 0.9 % 100 mL IVPB        2 g 200 mL/hr over 30 Minutes Intravenous  Once  11/30/20 1626 11/30/20 1752          Subjective: Seen and examined.  When I entered the room, his wife was not at the bedside.  Although patient looks physically weak but he is requiring 2 L of oxygen which is his baseline and he mentioned that he feels like his breathing is back to his baseline.  He was equivocal when I asked about him if he would like to go home.  His wife walked in during the latter part of encounter and basically stated that she does not think he is back to baseline and she is not comfortable taking her home today.  We will keep patient in the hospital for another day.  Reassess tomorrow morning.  Objective: Vitals:   12/03/20 0057 12/03/20 0440 12/03/20 0924 12/03/20 0929  BP: 95/63 115/61 100/64 100/64  Pulse: 79 91  (!) 103  Resp: (!) 25 20  (!) 24  Temp: (!) 97.5 F (36.4 C) 98.2 F (36.8 C)  98.1 F (36.7 C)  TempSrc: Oral   Oral  SpO2: 99% 96%  100%  Weight:      Height:        Intake/Output Summary (Last 24 hours) at 12/03/2020 1415 Last data filed at 12/03/2020 1234 Gross per 24 hour  Intake 240 ml   Output 2950 ml  Net -2710 ml    Filed Weights   11/30/20 1540  Weight: 83 kg    Examination:  General exam: Appears very weak Respiratory system: Diminished breath sounds at the bases bilaterally. Respiratory effort normal. Cardiovascular system: S1 & S2 heard, irregularly irregular rate and rhythm, no JVD, murmurs, rubs, gallops or clicks. No pedal edema. Gastrointestinal system: Abdomen is nondistended, soft and nontender. No organomegaly or masses felt. Normal bowel sounds heard. Central nervous system: Alert and oriented. No focal neurological deficits. Extremities: Symmetric 5 x 5 power. Skin: No rashes, lesions or ulcers.  Psychiatry: Judgement and insight appear normal. Mood & affect appropriate.    Data Reviewed: I have personally reviewed following labs and imaging studies  CBC: Recent Labs  Lab 11/30/20 1627 12/01/20 0353 12/01/20 1602 12/02/20 0359 12/03/20 0315  WBC 28.6* 24.0*  --  18.7* 18.8*  NEUTROABS 26.4*  --   --  17.1* 16.9*  HGB 6.6* 6.8* 7.7* 7.7* 8.8*  HCT 21.9* 22.6* 24.9* 24.9* 28.8*  MCV 104.8* 102.7*  --  98.8 99.3  PLT 374 293  --  279 563    Basic Metabolic Panel: Recent Labs  Lab 11/30/20 1627 12/01/20 0353 12/02/20 0359 12/03/20 0315  NA 140 139 137 137  K 4.9 4.3 3.0* 3.5  CL 101 106 99 99  CO2 27 24 24 27   GLUCOSE 168* 136* 137* 148*  BUN 28* 21 17 25*  CREATININE 1.19 0.94 0.87 0.94  CALCIUM 9.6 8.9 8.9 9.1  MG  --   --  1.3* 1.8    GFR: Estimated Creatinine Clearance: 77.7 mL/min (by C-G formula based on SCr of 0.94 mg/dL). Liver Function Tests: Recent Labs  Lab 11/30/20 1627  AST 18  ALT 19  ALKPHOS 72  BILITOT 0.4  PROT 7.0  ALBUMIN 2.4*    No results for input(s): LIPASE, AMYLASE in the last 168 hours. No results for input(s): AMMONIA in the last 168 hours. Coagulation Profile: Recent Labs  Lab 11/30/20 1627  INR 1.1    Cardiac Enzymes: No results for input(s): CKTOTAL, CKMB, CKMBINDEX, TROPONINI  in the last 168 hours. BNP (last 3  results) No results for input(s): PROBNP in the last 8760 hours. HbA1C: No results for input(s): HGBA1C in the last 72 hours. CBG: Recent Labs  Lab 12/02/20 0746 12/02/20 1213 12/02/20 1629 12/03/20 0813 12/03/20 1108  GLUCAP 137* 189* 200* 142* 190*    Lipid Profile: No results for input(s): CHOL, HDL, LDLCALC, TRIG, CHOLHDL, LDLDIRECT in the last 72 hours. Thyroid Function Tests: No results for input(s): TSH, T4TOTAL, FREET4, T3FREE, THYROIDAB in the last 72 hours. Anemia Panel: No results for input(s): VITAMINB12, FOLATE, FERRITIN, TIBC, IRON, RETICCTPCT in the last 72 hours. Sepsis Labs: Recent Labs  Lab 11/30/20 1627 11/30/20 1827 12/01/20 0353 12/02/20 0359  PROCALCITON  --   --  0.99 0.80  LATICACIDVEN 5.0* 1.1  --   --      Recent Results (from the past 240 hour(s))  Urine culture     Status: Abnormal   Collection Time: 11/30/20  4:27 PM   Specimen: In/Out Cath Urine  Result Value Ref Range Status   Specimen Description   Final    IN/OUT CATH URINE Performed at [facility] [address], [place], [place] [address]    Special Requests   Final    NONE Performed at [facility], [place] [employer] [address], [place], [place] [address]    Culture 50,000 COLONIES/mL PROTEUS MIRABILIS (A)  Final   Report Status 12/03/2020 FINAL  Final   Organism ID, Bacteria PROTEUS MIRABILIS (A)  Final      Susceptibility   Proteus mirabilis - MIC*    AMPICILLIN <=2 SENSITIVE Sensitive     CEFAZOLIN <=4 SENSITIVE Sensitive     CEFEPIME <=0.12 SENSITIVE Sensitive     CEFTRIAXONE <=0.25 SENSITIVE Sensitive     CIPROFLOXACIN <=0.25 SENSITIVE Sensitive     GENTAMICIN <=1 SENSITIVE Sensitive     IMIPENEM 2 SENSITIVE Sensitive     NITROFURANTOIN RESISTANT Resistant     TRIMETH/SULFA <=20 SENSITIVE Sensitive     AMPICILLIN/SULBACTAM <=2 SENSITIVE Sensitive     PIP/TAZO <=4 SENSITIVE Sensitive     * 50,000  COLONIES/mL PROTEUS MIRABILIS  Blood Culture (routine x 2)     Status: None (Preliminary result)   Collection Time: 11/30/20  4:27 PM   Specimen: BLOOD  Result Value Ref Range Status   Specimen Description   Final    BLOOD LEFT ANTECUBITAL Performed at [facility] [address], [place], [place] [address]    Special Requests   Final    BOTTLES DRAWN AEROBIC AND ANAEROBIC Blood Culture adequate volume Performed at [facility] [address] [place], [place] [address]    Culture   Final    NO GROWTH 3 DAYS Performed at [facility], [place] [address], [place], [place] [address]    Report Status PENDING  Incomplete  Blood Culture (routine x 2)     Status: None (Preliminary result)   Collection Time: 11/30/20  4:27 PM   Specimen: BLOOD  Result Value Ref Range Status   Specimen Description   Final    BLOOD RIGHT ANTECUBITAL Performed at [facility] [address] [place], [place] [address]    Special Requests   Final    BOTTLES DRAWN AEROBIC AND ANAEROBIC Blood Culture adequate volume Performed at [facility] [address] [place],  [address]    Culture   Final    NO GROWTH 3 DAYS Performed at [facility], [place]  [address], [place], [place] [address]    Report Status PENDING  Incomplete  Resp Panel by RT-PCR (Flu A&B, Covid)     Status: None   Collection Time: 11/30/20  4:27 PM   Specimen: Nasopharyngeal(NP) swabs in vial transport medium  Result Value Ref Range Status   SARS Coronavirus 2 by RT PCR NEGATIVE NEGATIVE Final    Comment: (NOTE) SARS-CoV-2 target nucleic acids are NOT DETECTED.  The SARS-CoV-2 RNA is generally detectable in upper respiratory specimens during the acute phase of infection. The lowest concentration of SARS-CoV-2 viral copies this assay can detect is 138 copies/mL. A negative result does not preclude SARS-Cov-2 infection and should not be used as  the sole basis for treatment or other patient management decisions. A negative result may occur with  improper specimen collection/handling, submission of specimen other than nasopharyngeal swab, presence of viral mutation(s) within the areas targeted by this assay, and inadequate number of viral copies(<138 copies/mL). A negative result must be combined with clinical observations, patient history, and epidemiological information. The expected result is Negative.  Fact Sheet for Patients:  EntrepreneurPulse.com.au  Fact Sheet for Healthcare Providers:  IncredibleEmployment.be  This test is no t yet approved or cleared by the [place] FDA and  has been authorized for detection and/or diagnosis of SARS-CoV-2 by FDA under an Emergency Use Authorization (EUA). This EUA will remain  in effect (meaning this test can be used) for the duration of the COVID-19 declaration under Section 564(b)(1) of the Act, 21 U.S.C.section 360bbb-3(b)(1), unless the authorization is terminated  or revoked sooner.       Influenza A by PCR NEGATIVE NEGATIVE Final   Influenza B by PCR NEGATIVE NEGATIVE Final    Comment: (NOTE) The Xpert Xpress SARS-CoV-2/FLU/RSV plus assay is intended as an aid in the diagnosis of influenza from Nasopharyngeal swab specimens and should not be used as a sole basis for treatment. Nasal washings and aspirates are unacceptable for Xpert Xpress SARS-CoV-2/FLU/RSV testing.  Fact Sheet for Patients: EntrepreneurPulse.com.au  Fact Sheet for Healthcare Providers: IncredibleEmployment.be  This test is not yet approved or cleared by the [place] FDA and has been authorized for detection and/or diagnosis of SARS-CoV-2 by FDA under an Emergency Use Authorization (EUA). This EUA will remain in effect (meaning this test can be used) for the duration of the COVID-19 declaration under Section 564(b)(1) of  the Act, 21 U.S.C. section 360bbb-3(b)(1), unless the authorization is terminated or revoked.  Performed at [facility], [place] [address] [place], [place] [address]   MRSA PCR Screening     Status: None   Collection Time: 12/01/20  8:00 PM  Result Value Ref Range Status   MRSA by PCR NEGATIVE NEGATIVE Final    Comment:        The GeneXpert MRSA Assay (FDA approved for NASAL specimens only), is one component of a comprehensive MRSA colonization surveillance program. It is not intended to diagnose MRSA infection nor to guide or monitor treatment for MRSA infections. Performed at [facility], [place] [address], [place], [place] [address]   Expectorated Sputum Assessment w Gram Stain, Rflx to Resp Cult     Status: None   Collection Time: 12/02/20  6:30 AM   Specimen: Expectorated Sputum  Result Value Ref Range Status   Specimen Description EXPECTORATED SPUTUM  Final   Special Requests NONE  Final   Sputum evaluation   Final    Sputum specimen not acceptable for testing.  Please recollect.  INFORM COMMING K. RN  ON 12/02/2020 @ 0706 BY MECIAL J. Performed at [facility], [place] [address] [place], [place] [address]    Report Status 12/02/2020 FINAL  Final  Expectorated Sputum Assessment w Gram Stain, Rflx to Resp Cult     Status: None   Collection Time: 12/02/20 12:07 PM  Result Value Ref Range Status   Specimen Description EXPECTORATED SPUTUM  Final   Special Requests NONE  Final   Sputum evaluation   Final    THIS SPECIMEN IS ACCEPTABLE FOR SPUTUM CULTURE Performed at [facility], [place] [address] [place], [place] [address]    Report Status 12/02/2020 FINAL  Final  Culture, Respiratory w Gram Stain     Status: None (Preliminary result)   Collection Time: 12/02/20 12:07 PM  Result Value Ref Range Status   Specimen Description   Final    EXPECTORATED SPUTUM Performed at [facility] [address] [place], [place] [address]    Special Requests   Final    NONE Reflexed from [id number] Performed at [facility], [place] [address], [place], [place] [address]    Gram Stain   Final    ABUNDANT WBC PRESENT,BOTH PMN AND MONONUCLEAR MODERATE GRAM NEGATIVE RODS MODERATE GRAM POSITIVE COCCI FEW GRAM VARIABLE ROD    Culture   Final    CULTURE REINCUBATED FOR BETTER GROWTH Performed at [facility], [place] [address], [place], [place] [address]    Report Status PENDING  Incomplete       Radiology Studies: CT Angio Chest Pulmonary Embolism (PE) W or WO Contrast  Result Date: 12/01/2020 CLINICAL DATA:  75 year old male with concern for pulmonary embolism. History of non-small cell lung cancer status post radiation and immunotherapy and ongoing chemotherapy. EXAM: CT ANGIOGRAPHY CHEST WITH CONTRAST TECHNIQUE: Multidetector CT imaging of the chest was performed using the standard protocol during bolus administration of intravenous contrast. Multiplanar CT image reconstructions and MIPs were obtained to evaluate the vascular anatomy. CONTRAST:  28mL OMNIPAQUE IOHEXOL 350 MG/ML SOLN COMPARISON:  Chest CT dated 06/07/2020. chest radiograph dated 11/30/2020. FINDINGS: Cardiovascular: There is no cardiomegaly. Small pericardial effusion similar to prior CT. Mild atherosclerotic calcification of the thoracic aorta. No aneurysmal dilatation or dissection. Evaluation of the pulmonary arteries is limited due to respiratory motion artifact and suboptimal visualization of the peripheral branches. No large or central pulmonary artery embolus identified. Mediastinum/Nodes: Mildly enlarged left hilar lymph node measures approximately 2 cm. Evaluation of the right hilum and mediastinum is limited due to consolidative changes of the right lung. The esophagus is grossly unremarkable. No mediastinal fluid collection. Lungs/Pleura: There is a large area of consolidative change  with air bronchogram involving the right lung and predominantly right middle and right lower lobe, likely post treatment changes. There is overall decreased right lung volume with slight shift of the mediastinum to the right of the midline. Small loculated appearing right pleural effusion. There is a 5 x 7 cm fluid-filled cavitary mass in the superior segment of the left lower lobe containing small pockets of air. There is a small left pleural effusion. There is a 9 mm left upper lobe nodule with irregular margins (49/10), minimally decreased since the prior CT (previously measuring approximately 10 mm. There is no pneumothorax. There is high-grade narrowing of the bronchus intermedius. The central airways are otherwise patent. Upper Abdomen: No acute abnormality. Musculoskeletal: No chest wall abnormality. No acute or significant osseous findings. Review of  the MIP images confirms the above findings. IMPRESSION: 1. No CT evidence of central pulmonary artery embolus. 2. Large area of consolidative change with air bronchogram involving the right lung, likely post treatment changes. 3. A 5 x 7 cm fluid-filled cavitary lesion in the left lower lobe. This may represent a necrotic mass/neoplasm or an infectious process. 4. Mildly enlarged left hilar lymph node. 5. Small loculated appearing right pleural effusion. 6. Small left pleural effusion. 7. Small pericardial effusion similar to prior CT. 8. A 9 mm left upper lobe nodule, minimally decreased in size since the prior CT. 9. Aortic Atherosclerosis (ICD10-I70.0). Electronically Signed   By: Anner Crete M.D.   On: 12/01/2020 15:54    Scheduled Meds:  feeding supplement  237 mL Oral Q24H   furosemide  40 mg Intravenous BID   guaiFENesin  600 mg Oral BID   insulin aspart  0-9 Units Subcutaneous TID WC   metoprolol tartrate  25 mg Oral BID   multivitamin with minerals  1 tablet Oral Daily   sodium chloride flush  3 mL Intravenous Q12H   Continuous  Infusions:  sodium chloride Stopped (11/30/20 1819)   ceFEPime (MAXIPIME) IV 2 g (12/03/20 0537)   vancomycin 1,000 mg (12/03/20 1231)     LOS: 3 days   Time spent: 29 minutes   Darliss Cheney, MD [facility]  12/03/2020, 2:15 PM   How to contact the [facility] Attending or Consulting provider [place] or covering provider during after hours [place], for this patient?  Check the care team in [facility] and look for a) attending/consulting [facility] provider listed and b) the [facility] team listed. Page or secure chat [phone]. Log into [email or web address] and use [place] universal password to access. If you do not have the password, please contact the hospital operator. Locate the [facility] provider you are looking for under [facility] and page to a number that you can be directly reached. If you still have difficulty reaching the provider, please page the [facility] (Director on Call) for the Hospitalists listed on amion for assistance.

## 2020-12-03 NOTE — Evaluation (Signed)
Physical Therapy Evaluation Patient Details Name: Jeremy [name] MRN: [id number] DOB: 02/10/1946 Today's Date: 12/03/2020   History of Present Illness  Patient admitted for sepsis. Jeremy PUTZIER is a 75 y.o. 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     Patient recently started palliative radiotherapy for his known lung cancer. He has not been able to attend his last 3 radiation treatments. He reports occasional lightheadedness.    Clinical Impression  Jeremy [name] is 75 y.o. male admitted with above HPI and diagnosis. Patient is currently limited by functional impairments below (see PT problem list). Patient lives with spouse and is has required increased assist over the las ~ 3 weeks due to greater weakness but typically is able to mobilize with RW and independent with ADL's. He currently requires Min-Mod assist for transfers and short bout of gait. Mobility limited due to HR reaching max of 144 bpm and SpO2 dropping to ~84% on RA. Pt was on RA when therapist arrived and saturating at 94% at rest. 2L donned and sats recovered to 99%. Patient will benefit from continued skilled PT interventions to address impairments and progress independence with mobility, recommending ST rehab at SNF; if pt progresses during acute stay may be able to return home with HHPT. Acute PT will follow and progress as able.     Follow Up Recommendations SNF    Equipment Recommendations  None recommended by PT (TBA)    Recommendations for Other Services       Precautions / Restrictions Precautions Precautions:  Fall Precaution Comments: hx of falls Restrictions Weight Bearing Restrictions: No      Mobility  Bed Mobility Overal bed mobility: Needs Assistance Bed Mobility: Supine to Sit     Supine to sit: Min assist          Transfers Overall transfer level: Needs assistance Equipment used: Rolling walker (2 wheeled) Transfers: Sit to/from Stand Sit to Stand: Min assist;Mod assist;From elevated surface         General transfer comment: pt required bed elevated, min-mod cues and assist for power up and to steady once standing.  Ambulation/Gait Ambulation/Gait assistance: Mod assist Gait Distance (Feet): 8 Feet Assistive device: Rolling walker (2 wheeled) Gait Pattern/deviations: Step-through pattern;Decreased step length - right;Decreased step length - left;Decreased stride length;Shuffle;Trunk flexed Gait velocity: decr   General Gait Details: cues for proximity to RW and mod assist to steady and prevent LOB throughout.  Stairs            Wheelchair Mobility    Modified Rankin (Stroke Patients Only)       Balance Overall balance assessment: Needs assistance Sitting-balance support: No upper extremity supported Sitting balance-Leahy Scale: Good     Standing balance support: During functional activity Standing balance-Leahy Scale: Poor                               Pertinent Vitals/Pain Pain Assessment: No/denies pain    Home Living Family/patient expects to be discharged to:: Private residence Living Arrangements: Spouse/significant other Available Help at Discharge: Family;Available 24 hours/day Type of Home:  House Home Access: Stairs to enter Entrance Stairs-Rails: Right Entrance Stairs-Number of Steps: 2 Home Layout: One level Home Equipment: Cane - single point;Shower seat - built in;Grab bars - tub/shower;Toilet riser;Other (comment);Walker - 2 wheels      Prior Function Level of Independence: Needs assistance   Gait / Transfers  Assistance Needed: Spouse reports for the last three weeks she's been helping get up to the bathroom. Reports he typically uses a cane and has been holding on to the furniture. She uses a gait belt with him when he gets usnteady.  ADL's / Homemaking Assistance Needed: Wife has been helping him with dressing, helps him in the shower        Hand Dominance   Dominant Hand: Right    Extremity/Trunk Assessment   Upper Extremity Assessment Upper Extremity Assessment: Defer to OT evaluation    Lower Extremity Assessment Lower Extremity Assessment: Generalized weakness    Cervical / Trunk Assessment Cervical / Trunk Assessment: Kyphotic  Communication   Communication: No difficulties  Cognition Arousal/Alertness: Awake/alert Behavior During Therapy: WFL for tasks assessed/performed Overall Cognitive Status: Within Functional Limits for tasks assessed                                 General Comments: oriented to self, place, situation. pleasant, takes extra time to follow cues occasionally.      General Comments      Exercises     Assessment/Plan    PT Assessment Patient needs continued PT services  PT Problem List Decreased strength;Decreased activity tolerance;Decreased balance;Decreased mobility;Decreased knowledge of use of DME;Decreased safety awareness       PT Treatment Interventions DME instruction;Gait training;Functional mobility training;Therapeutic activities;Therapeutic exercise;Stair training;Balance training;Neuromuscular re-education;Patient/family education    PT Goals (Current goals can be found in the Care Plan section)  Acute Rehab PT Goals Patient Stated Goal: to ambulate to bathroom PT Goal Formulation: With patient Time For Goal Achievement: 12/04/20 Potential to Achieve Goals: Good    Frequency Min 3X/week (pt would like to go home if can progress)   Barriers to discharge        Co-evaluation               AM-PAC PT "6  Clicks" Mobility  Outcome Measure Help needed turning from your back to your side while in a flat bed without using bedrails?: A Little Help needed moving from lying on your back to sitting on the side of a flat bed without using bedrails?: A Little Help needed moving to and from a bed to a chair (including a wheelchair)?: A Lot Help needed standing up from a chair using your arms (e.g., wheelchair or bedside chair)?: A Lot Help needed to walk in hospital room?: A Lot Help needed climbing 3-5 steps with a railing? : Total 6 Click Score: 13    End of Session Equipment Utilized During Treatment: Gait belt Activity Tolerance: Patient tolerated treatment well Patient left: in chair;with call bell/phone within reach;with chair alarm set;with family/visitor present Nurse Communication: Mobility status PT Visit Diagnosis: Muscle weakness (generalized) (M62.81);Difficulty in walking, not elsewhere classified (R26.2)    Time: 0867-6195 PT Time Calculation (min) (ACUTE ONLY): 27 min   Charges:   PT Evaluation $PT Eval Moderate Complexity: 1 Mod PT Treatments $Gait Training: 8-22 mins        Verner Mould, DPT Acute Rehabilitation Services Office [phone] Pager [phone]   Jacques Navy  12/03/2020, 3:45 PM

## 2020-12-04 LAB — GLUCOSE, CAPILLARY
Glucose-Capillary: 120 mg/dL — ABNORMAL HIGH (ref 70–99)
Glucose-Capillary: 184 mg/dL — ABNORMAL HIGH (ref 70–99)
Glucose-Capillary: 288 mg/dL — ABNORMAL HIGH (ref 70–99)

## 2020-12-04 LAB — BASIC METABOLIC PANEL
Anion gap: 12 (ref 5–15)
BUN: 29 mg/dL — ABNORMAL HIGH (ref 8–23)
CO2: 29 mmol/L (ref 22–32)
Calcium: 9 mg/dL (ref 8.9–10.3)
Chloride: 94 mmol/L — ABNORMAL LOW (ref 98–111)
Creatinine, Ser: 0.94 mg/dL (ref 0.61–1.24)
GFR, Estimated: >60 mL/min (ref >60)
Glucose, Bld: 130 mg/dL — ABNORMAL HIGH (ref 70–99)
Potassium: 3.1 mmol/L — ABNORMAL LOW (ref 3.5–5.1)
Sodium: 135 mmol/L (ref 135–145)

## 2020-12-04 LAB — CULTURE, RESPIRATORY W GRAM STAIN: Culture: NORMAL

## 2020-12-04 MED ORDER — FOLIC ACID 1 MG PO TABS
1.0000 mg | ORAL_TABLET | Freq: Every evening | ORAL | Status: DC
  Administered 2020-12-04 – 2020-12-08 (×5): 1 mg via ORAL
  Filled 2020-12-04 (×4): qty 1

## 2020-12-04 MED ORDER — DOCUSATE SODIUM 100 MG PO CAPS
100.0000 mg | ORAL_CAPSULE | Freq: Every day | ORAL | Status: DC
  Administered 2020-12-04 – 2020-12-07 (×4): 100 mg via ORAL
  Filled 2020-12-04 (×4): qty 1

## 2020-12-04 MED ORDER — METHOTREXATE 2.5 MG PO TABS: 10.0000 mg | ORAL_TABLET | ORAL | Status: DC

## 2020-12-04 MED ORDER — TAMSULOSIN HCL 0.4 MG PO CAPS
0.4000 mg | ORAL_CAPSULE | Freq: Every day | ORAL | Status: DC
  Administered 2020-12-04 – 2020-12-08 (×5): 0.4 mg via ORAL
  Filled 2020-12-04 (×5): qty 1

## 2020-12-04 MED ORDER — CENTRUM ADULTS PO TABS: ORAL_TABLET | Freq: Every day | ORAL | Status: DC

## 2020-12-04 MED ORDER — OXYCODONE HCL 5 MG PO TABS
5.0000 mg | ORAL_TABLET | Freq: Four times a day (QID) | ORAL | Status: DC | PRN
  Administered 2020-12-04 – 2020-12-07 (×2): 5 mg via ORAL
  Filled 2020-12-04 (×2): qty 1

## 2020-12-04 MED ORDER — FERROUS SULFATE 325 (65 FE) MG PO TABS
325.0000 mg | ORAL_TABLET | Freq: Every day | ORAL | Status: DC
  Administered 2020-12-04 – 2020-12-08 (×5): 325 mg via ORAL
  Filled 2020-12-04 (×5): qty 1

## 2020-12-04 MED ORDER — OXYCODONE-ACETAMINOPHEN 5-325 MG PO TABS
1.0000 | ORAL_TABLET | Freq: Four times a day (QID) | ORAL | Status: DC | PRN
Start: 2020-12-04 — End: 2020-12-09
  Administered 2020-12-04 – 2020-12-06 (×2): 1 via ORAL
  Filled 2020-12-04 (×2): qty 1

## 2020-12-04 MED ORDER — ALBUTEROL SULFATE HFA 108 (90 BASE) MCG/ACT IN AERS: 2.0000 | INHALATION_SPRAY | Freq: Four times a day (QID) | RESPIRATORY_TRACT | Status: DC | PRN

## 2020-12-04 MED ORDER — PREDNISOLONE ACETATE 1 % OP SUSP
1.0000 [drp] | Freq: Two times a day (BID) | OPHTHALMIC | Status: DC
  Administered 2020-12-04 – 2020-12-08 (×9): 1 [drp] via OPHTHALMIC
  Filled 2020-12-04 (×2): qty 5

## 2020-12-04 MED ORDER — TRAMADOL HCL 50 MG PO TABS: 50.0000 mg | ORAL_TABLET | ORAL | Status: DC | PRN

## 2020-12-04 MED ORDER — POTASSIUM CHLORIDE CRYS ER 20 MEQ PO TBCR
40.0000 meq | EXTENDED_RELEASE_TABLET | Freq: Once | ORAL | Status: AC
  Administered 2020-12-04: 40 meq via ORAL
  Filled 2020-12-04: qty 2

## 2020-12-04 MED ORDER — GABAPENTIN 300 MG PO CAPS: 600.0000 mg | ORAL_CAPSULE | Freq: Every evening | ORAL | Status: DC | PRN

## 2020-12-04 MED ORDER — CYCLOBENZAPRINE HCL 10 MG PO TABS
10.0000 mg | ORAL_TABLET | Freq: Every evening | ORAL | Status: DC | PRN
  Administered 2020-12-07: 10 mg via ORAL
  Filled 2020-12-04: qty 1

## 2020-12-04 MED ORDER — BISACODYL 5 MG PO TBEC: 10.0000 mg | DELAYED_RELEASE_TABLET | Freq: Every day | ORAL | Status: DC | PRN

## 2020-12-04 MED ORDER — OXYCODONE-ACETAMINOPHEN 10-325 MG PO TABS: 1.0000 | ORAL_TABLET | Freq: Four times a day (QID) | ORAL | Status: DC | PRN

## 2020-12-04 MED ORDER — MECLIZINE HCL 25 MG PO TABS: 25.0000 mg | ORAL_TABLET | Freq: Three times a day (TID) | ORAL | Status: DC | PRN

## 2020-12-04 MED ORDER — OMEGA-3-ACID ETHYL ESTERS 1 G PO CAPS
1.0000 g | ORAL_CAPSULE | Freq: Every day | ORAL | Status: DC
  Administered 2020-12-04 – 2020-12-08 (×5): 1 g via ORAL
  Filled 2020-12-04 (×5): qty 1

## 2020-12-04 NOTE — Progress Notes (Signed)
PROGRESS NOTE  Jeremy [name] ZOX:[id number] DOB: 1946-06-05 DOA: 11/30/2020 PCP: [name], MD  Brief History   Jeremy [name] is a 75 y.o. 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   Patient recently started palliative radiotherapy for his known lung cancer.  Over the last 4-5 days he has been having generalized weakness, cough productive of yellow sputum, fevers, diaphoresis, and chills.  He has not been able to attend his last 3 radiation treatments.  He has not had any dyspnea, chest pain, nausea, vomiting, abdominal pain, dysuria, or diarrhea.  He has had poor oral intake.  He reports occasional lightheadedness without syncope or fall.  He has not seen any obvious bleeding.   ED Course:  Initial vitals showed BP 104/65, pulse 113, RR 22, temp 97.8 F, SPO2 95% on room air.  T-max 102 F while in the ED.   Labs show WBC 28.6, hemoglobin 6.6, platelets 374,000, lactic acid 5.0, sodium 140, potassium 4.9, bicarb 27, BUN 28, creatinine 1.19, serum glucose 168, LFTs within normal limits, INR 1.1.   Urinalysis shows negative nitrates, negative leukocytes, 0-5 RBC/hpf, 6-10 WBC/hpf, rare bacteria microscopy.  Blood and urine cultures obtained and pending.  SARS-CoV-2 PCR in process.   Portable chest x-ray shows large left lower lobe cavitary lung mass as seen on prior with improved aeration on the left lung base.  Similar appearing right hemithorax volume loss with pleural thickening or loculated effusion and airspace disease in right mid to lower lung again noted.   Patient was given 500 ccs  normal saline, IV vancomycin and cefepime, and ordered to receive 1 unit PRBC transfusion.  The hospitalist service was consulted to admit for further evaluation and management. Further hospital course as below.  Consultants  None  Procedures  None  Antibiotics   Anti-infectives (From admission, onward)    Start     Dose/Rate Route Frequency Ordered Stop   12/02/20 1200  vancomycin (VANCOREADY) IVPB 1000 mg/200 mL        1,000 mg 200 mL/hr over 60 Minutes Intravenous Every 12 hours 12/02/20 0954     12/01/20 1800  vancomycin (VANCOREADY) IVPB 1500 mg/300 mL  Status:  Discontinued        1,500 mg 150 mL/hr over 120 Minutes Intravenous Every 24 hours 11/30/20 2004 12/02/20 0954   12/01/20 0000  ceFEPIme (MAXIPIME) 2 g in sodium chloride 0.9 % 100 mL IVPB        2 g 200 mL/hr over 30 Minutes Intravenous Every 8 hours 11/30/20 2005     11/30/20 2000  metroNIDAZOLE (FLAGYL) IVPB 500 mg  Status:  Discontinued        500 mg 100 mL/hr over 60 Minutes Intravenous Every 8 hours 11/30/20 1951 12/01/20 1336   11/30/20 1630  vancomycin (VANCOREADY) IVPB 1750 mg/350 mL        1,750 mg 175 mL/hr over 120 Minutes Intravenous  Once 11/30/20 1626 11/30/20 2007   11/30/20 1630  ceFEPIme (MAXIPIME) 2 g in sodium chloride 0.9 % 100 mL IVPB        2 g 200 mL/hr over 30 Minutes Intravenous  Once 11/30/20 1626 11/30/20 1752      Subjective  The patient is resting comfortably. No new complaints.   Objective   Vitals:  Vitals:   12/04/20 0927 12/04/20 1337  BP: 102/67 104/69  Pulse:  98  Resp:  14  Temp:  (!) 97.3 F (36.3 C)  SpO2:  97%    Exam:  Constitutional:  The patient is awake, alert, and oriented x 3. No acute distress. Respiratory:  No increased work of breathing. No wheezes, rales, or rhonchi No tactile fremitus Cardiovascular:  Regular rate and rhythm No murmurs, ectopy, or gallups. No lateral PMI. No thrills. Abdomen:  Abdomen is soft, non-tender, non-distended No  hernias, masses, or organomegaly Normoactive bowel sounds.  Musculoskeletal:  No cyanosis, clubbing, or edema Skin:  No rashes, lesions, ulcers palpation of skin: no induration or nodules Neurologic:  CN 2-12 intact Sensation all 4 extremities intact Psychiatric:  Mental status Mood, affect appropriate Orientation to person, place, time  judgment and insight appear intact  I have personally reviewed the following:   Today's Data   Vitals:   12/04/20 0927 12/04/20 1337  BP: 102/67 104/69  Pulse:  98  Resp:  14  Temp:  (!) 97.3 F (36.3 C)  SpO2:  97%   Lab Data  CBC    Component Value Date/Time   WBC 18.8 (H) 12/03/2020 0315   RBC 2.90 (L) 12/03/2020 0315   HGB 8.8 (L) 12/03/2020 0315   HGB 9.5 (L) 10/04/2020 0946   HCT 28.8 (L) 12/03/2020 0315   PLT 274 12/03/2020 0315   PLT 171 10/04/2020 0946   MCV 99.3 12/03/2020 0315   MCH 30.3 12/03/2020 0315   MCHC 30.6 12/03/2020 0315   RDW 17.5 (H) 12/03/2020 0315   LYMPHSABS 0.6 (L) 12/03/2020 0315   MONOABS 0.5 12/03/2020 0315   EOSABS 0.4 12/03/2020 0315   BASOSABS 0.0 12/03/2020 7106   basic metabolic panel  Micro Data  Blood cultures x 2: No growth Sputum culture: Normal respiratory flora  Imaging  CTA chest 12/01/2020: No pulmonary embolus. Large area of consolidation with air bronchogram involving the right lung. 5x7 cm cavitary lesion that is fluid filled representing either a necrotic mass or neoplasm or infectious process. Mildly enlarged left hilar lymph node. There is a 9 mm left upper lobe nodule which is decreased in size from the prior CT.  Cardiology Data  EKG Atrial fibrillation with RVR.  Scheduled Meds:  docusate sodium  100 mg Oral QHS   feeding supplement  237 mL Oral Q24H   ferrous sulfate  325 mg Oral Q supper   folic acid  1 mg Oral QPM   furosemide  40 mg Intravenous BID   guaiFENesin  600 mg Oral BID   insulin aspart  0-9 Units Subcutaneous TID WC   metoprolol tartrate  25 mg Oral  BID   multivitamin with minerals  1 tablet Oral Daily   omega-3 acid ethyl esters  1 g Oral Daily   prednisoLONE acetate  1 drop Left Eye Q12H   sodium chloride flush  3 mL Intravenous Q12H   tamsulosin  0.4 mg Oral Daily   Continuous Infusions:  sodium chloride Stopped (11/30/20 1819)   ceFEPime (MAXIPIME) IV 2 g (12/04/20 1455)   vancomycin 1,000 mg (12/04/20 0934)    Principal Problem:   Severe sepsis with lactic acidosis (HCC) Active Problems:   Type 2 diabetes mellitus (HCC)   HTN (hypertension)   AF (paroxysmal atrial fibrillation) ([place])  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HCC)   Chronic respiratory failure with hypoxia (HCC)   LOS: 4 days   A & P  Severe sepsis secondary to healthcare associated pneumonia, POA: Patient presented with fever, tachycardia, tachypnea, leukocytosis, and lactic acidosis likely secondary to healthcare associated pneumonia.  Patient meets healthcare associated pneumonia criteria based off of history of cancer getting regular radiation. COVID and influenza negative. Chest x-ray also showed suspicion of possible empyema.  CT angiogram ruled out PE and confirmed very small amount of pain by my in the right side and cavitary lesion in the left lower lobe which is chronic.  Patient himself feels better as far as breathing goes but he continues to feel weak and was equivocal when asked about if he would like to go home.  He was not opposed to go home however his wife was not comfortable for him to go home as she thinks that he looks much weaker than his baseline.  Has remained afebrile.  Leukocytosis stable.  Still requiring 2 L of oxygen which is his baseline.  Continue current antibiotics.  All the cultures are negative.   Acute on chronic anemia/rectal bleeding: Denies active bleeding however on 12/01/2020, nurse found out streaks of blood on the surface of the stool when he had a large BM.  Based off of the pattern, likely hemorrhoidal which  does not need any intervention/colonoscopy at this point in time.  Presented with hemoglobin 6.6 and received 2 units of PRBC transfusion.  Hemoglobin improved to 7.7 posttransfusion and in fact has improved today to 8.8 without any transfusion.   Chronic respiratory failure with hypoxia: Uses 2 L of oxygen at baseline.  He is at his baseline.   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 and radiation oncology, Dr. Sondra Come.  Recently started on palliative radiotherapy.  Missed 3 doses.  Informed Dr. Julien Nordmann about his admission.   Paroxysmal atrial fibrillation: Not on anticoagulation due to transfusion dependent anemia.  Rates intermittently elevated.  Continue Lopressor.  Holding diltiazem due to soft blood pressure.   Type 2 diabetes: Continue to hold metformin.  Continue SSI.   Hypertension: Antihypertensives on hold due to soft blood pressures.   Psoriatic arthritis: Hold methotrexate.   Hypomagnesemia/hypokalemia: Resolved   DVT prophylaxis: SCDs Start: 11/30/20 1950   Code Status: DNR  Family Communication: Wife present at bedside.  Plan of care discussed with patient in length and he verbalized understanding and agreed with it.   Status is: Inpatient   Remains inpatient appropriate because:Ongoing diagnostic testing needed not appropriate for outpatient work up   Dispo: The patient is from: Home              Anticipated d/c is to: Home              Patient currently is not medically stable to d/c.              Difficult to place patient No     Estimated body mass index is 23.5 kg/m as calculated from the following:   Height as of this encounter: 6\' 2"  (1.88 m).   Weight as of this encounter: 83 kg.     Evangelia Whitaker, DO [facility] Direct contact: see [email or web address]  7PM-7AM  contact night coverage as above 12/04/2020, 5:22 PM  LOS: 4 days

## 2020-12-04 NOTE — Progress Notes (Signed)
Pharmacy Note    Methotrexate (Trexall; Rheumatrex) hold criteria Hgb < 8 WBC < 3 Pltc < 100K SCr > 1.5x baseline (or > 2 if baseline unknown) AST or ALT >3x ULN Bili > 1.5x ULN Ascites or pleural effusion Diarrhea - Grade 2 or higher Ulcerative stomatitis Unexplained pneumonitis / hypoxemia Active infection   Medication has been discontinued as pt is being treated for active infection.   Royetta Asal, PharmD, BCPS 12/04/2020 12:09 PM

## 2020-12-05 LAB — GLUCOSE, CAPILLARY
Glucose-Capillary: 135 mg/dL — ABNORMAL HIGH (ref 70–99)
Glucose-Capillary: 137 mg/dL — ABNORMAL HIGH (ref 70–99)
Glucose-Capillary: 236 mg/dL — ABNORMAL HIGH (ref 70–99)
Glucose-Capillary: 140 mg/dL — ABNORMAL HIGH (ref 70–99)

## 2020-12-05 LAB — CULTURE, BLOOD (ROUTINE X 2)
Culture: NO GROWTH
Culture: NO GROWTH
Special Requests: ADEQUATE
Special Requests: ADEQUATE

## 2020-12-05 MED ORDER — AMIODARONE HCL IN DEXTROSE 360-4.14 MG/200ML-% IV SOLN
30.0000 mg/h | INTRAVENOUS | Status: AC
  Administered 2020-12-05 – 2020-12-07 (×4): 30 mg/h via INTRAVENOUS
  Filled 2020-12-05 (×4): qty 200

## 2020-12-05 MED ORDER — AMIODARONE HCL IN DEXTROSE 360-4.14 MG/200ML-% IV SOLN
60.0000 mg/h | INTRAVENOUS | Status: AC
  Administered 2020-12-05 (×2): 60 mg/h via INTRAVENOUS
  Filled 2020-12-05: qty 200

## 2020-12-05 MED ORDER — DOXYLAMINE SUCCINATE (SLEEP) 25 MG PO TABS
25.0000 mg | ORAL_TABLET | Freq: Once | ORAL | Status: AC
  Administered 2020-12-05: 25 mg via ORAL
  Filled 2020-12-05: qty 1

## 2020-12-05 MED ORDER — AMIODARONE LOAD VIA INFUSION
150.0000 mg | Freq: Once | INTRAVENOUS | Status: AC
  Administered 2020-12-05: 150 mg via INTRAVENOUS
  Filled 2020-12-05: qty 83.34

## 2020-12-05 MED ORDER — CALCIUM CARBONATE ANTACID 500 MG PO CHEW
400.0000 mg | CHEWABLE_TABLET | Freq: Three times a day (TID) | ORAL | Status: DC | PRN
  Administered 2020-12-05: 400 mg via ORAL
  Filled 2020-12-05: qty 2

## 2020-12-05 NOTE — Progress Notes (Signed)
Occupational Therapy Treatment Patient Details Name: Jeremy [name] MRN: [id number] DOB: 1946-04-02 Today's Date: 12/05/2020    History of present illness Patient admitted for sepsis. Jeremy [name] is a 75 y.o. 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     Patient recently started palliative radiotherapy for his known lung cancer. He has not been able to attend his last 3 radiation treatments. He reports occasional lightheadedness.   OT comments  Treatment focused on promoting out of bed activity. Patient able to ambulate 4 feet forward and back with min guard and RW x 2. HR sustaining between 140-145 with limited activity. Patient placed in recliner. Patient's wife states patient only stays up in the recliner a couple of minutes before going back to bed.     Follow Up Recommendations  Home health OT    Equipment Recommendations  None recommended by OT    Recommendations for Other Services      Precautions / Restrictions Precautions Precautions: Fall;Other (comment) Precaution Comments: hx of falls, monitor HR Restrictions Weight Bearing Restrictions: No       Mobility Bed Mobility Overal bed mobility: Needs Assistance Bed Mobility: Supine to Sit     Supine to sit: Min assist     General bed mobility comments: Min assist for hand hold to transfer into sitting.    Transfers Overall transfer level: Needs assistance Equipment used: Rolling walker (2 wheeled) Transfers: Sit to/from Stand Sit to Stand: Min guard;From elevated surface         General transfer comment:  Increased time to power up into standing. Min guard to ambulate 4 feet forward and back toward door x 2 and then to recliner. Ambulation limited due to HR sustaining between 140-145 with minimal activity.    Balance Overall balance assessment: Mild deficits observed, not formally tested Sitting-balance support: No upper extremity supported Sitting balance-Leahy Scale: Good     Standing balance support: During functional activity Standing balance-Leahy Scale: Fair                             ADL either performed or assessed with clinical judgement   ADL                                               Vision       Perception     Praxis      Cognition Arousal/Alertness: Awake/alert Behavior During Therapy: WFL for tasks assessed/performed Overall Cognitive Status: Within Functional Limits for tasks assessed                                          Exercises     Shoulder Instructions       General Comments      Pertinent Vitals/ Pain       Pain Assessment: No/denies pain  Home Living  Prior Functioning/Environment              Frequency  Min 2X/week        Progress Toward Goals  OT Goals(current goals can now be found in the care plan section)  Progress towards OT goals: Progressing toward goals  Acute Rehab OT Goals Patient Stated Goal: to ambulate to bathroom OT Goal Formulation: With patient Time For Goal Achievement: 12/16/20 Potential to Achieve Goals: Good  Plan Discharge plan remains appropriate    Co-evaluation                 AM-PAC OT "6 Clicks" Daily Activity     Outcome Measure   Help from another person eating meals?: None Help from another person taking care of personal grooming?: A Little Help from another person toileting, which includes using toliet, bedpan, or urinal?: A Little Help from another person bathing  (including washing, rinsing, drying)?: A Little Help from another person to put on and taking off regular upper body clothing?: A Lot Help from another person to put on and taking off regular lower body clothing?: A Lot 6 Click Score: 17    End of Session Equipment Utilized During Treatment: Rolling walker;Oxygen  OT Visit Diagnosis: Unsteadiness on feet (R26.81);Muscle weakness (generalized) (M62.81)   Activity Tolerance Patient tolerated treatment well   Patient Left in chair;with call bell/phone within reach;with family/visitor present   Nurse Communication Mobility status (HR)        Time: 1450-1503 OT Time Calculation (min): 13 min  Charges: OT General Charges $OT Visit: 1 Visit OT Treatments $Therapeutic Activity: 8-22 mins  Jaaziah Schulke, OTR/L [place]  Office [phone] Pager: [place] 12/05/2020, 4:19 PM

## 2020-12-05 NOTE — Progress Notes (Signed)
PROGRESS NOTE  Jeremy [name] MPN:[id number] DOB: July 31, 1945 DOA: 11/30/2020 PCP: [name], MD  Brief History   Jeremy [name] is a 75 y.o. 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   Patient recently started palliative radiotherapy for his known lung cancer.  Over the last 4-5 days he has been having generalized weakness, cough productive of yellow sputum, fevers, diaphoresis, and chills.  He has not been able to attend his last 3 radiation treatments.  He has not had any dyspnea, chest pain, nausea, vomiting, abdominal pain, dysuria, or diarrhea.  He has had poor oral intake.  He reports occasional lightheadedness without syncope or fall.  He has not seen any obvious bleeding.   ED Course:  Initial vitals showed BP 104/65, pulse 113, RR 22, temp 97.8 F, SPO2 95% on room air.  T-max 102 F while in the ED.   Labs show WBC 28.6, hemoglobin 6.6, platelets 374,000, lactic acid 5.0, sodium 140, potassium 4.9, bicarb 27, BUN 28, creatinine 1.19, serum glucose 168, LFTs within normal limits, INR 1.1.   Urinalysis shows negative nitrates, negative leukocytes, 0-5 RBC/hpf, 6-10 WBC/hpf, rare bacteria microscopy.  Blood and urine cultures obtained and pending.  SARS-CoV-2 PCR in process.   Portable chest x-ray shows large left lower lobe cavitary lung mass as seen on prior with improved aeration on the left lung base.  Similar appearing right hemithorax volume loss with pleural thickening or loculated effusion and airspace disease in right mid to lower lung again noted.   Patient was given 500 ccs  normal saline, IV vancomycin and cefepime, and ordered to receive 1 unit PRBC transfusion.  The hospitalist service was consulted to admit for further evaluation and management.  The patient was admitted to a telemetry bed. He was found to have rectal bleeding, but it was felt to be due to hemorrhoids. The patient has been continued on IV Cefepme.   Due to the patient's low blood pressure since admission, his cardizem has been hold. As a result on 12/05/2020 the patient is in AF with RVR. I have discussed the patient with Dr. Debara Pickett. Cardiology will see the patient tomorrow. In the meantime I have started the patient on Amiodarone with a load followed by infusion.  Consultants  Cardiology  Procedures  None  Antibiotics   Anti-infectives (From admission, onward)    Start     Dose/Rate Route Frequency Ordered Stop   12/02/20 1200  vancomycin (VANCOREADY) IVPB 1000 mg/200 mL  Status:  Discontinued        1,000 mg 200 mL/hr over 60 Minutes Intravenous Every 12 hours 12/02/20 0954 12/05/20 1122   12/01/20 1800  vancomycin (VANCOREADY) IVPB 1500 mg/300 mL  Status:  Discontinued        1,500 mg 150 mL/hr over 120 Minutes Intravenous Every 24 hours 11/30/20 2004 12/02/20 0954   12/01/20 0000  ceFEPIme (MAXIPIME) 2 g in sodium chloride 0.9 % 100 mL IVPB        2 g 200 mL/hr over 30 Minutes Intravenous Every 8 hours 11/30/20 2005     11/30/20 2000  metroNIDAZOLE (FLAGYL) IVPB 500 mg  Status:  Discontinued        500 mg 100 mL/hr over 60 Minutes Intravenous Every 8 hours 11/30/20 1951 12/01/20 1336   11/30/20 1630  vancomycin (VANCOREADY) IVPB 1750 mg/350 mL        1,750 mg 175 mL/hr over 120 Minutes Intravenous  Once 11/30/20 1626 11/30/20 2007   11/30/20 1630  ceFEPIme (MAXIPIME) 2 g in sodium chloride 0.9 % 100 mL IVPB        2 g 200 mL/hr over 30 Minutes Intravenous  Once 11/30/20 1626 11/30/20 1752      Subjective  The patient is resting comfortably. No new complaints.   Objective    Vitals:  Vitals:   12/04/20 2305 12/05/20 1401  BP: 108/76 98/70  Pulse:  (!) 103  Resp:  20  Temp:  97.8 F (36.6 C)  SpO2:  95%    Exam:  Constitutional:  The patient is awake, alert, and oriented x 3. No acute distress. Respiratory:  No increased work of breathing. No wheezes, rales, or rhonchi No tactile fremitus Cardiovascular:  IR with rapid rate. No murmurs, ectopy, or gallups. No lateral PMI. No thrills. Abdomen:  Abdomen is soft, non-tender, non-distended No hernias, masses, or organomegaly Normoactive bowel sounds.  Musculoskeletal:  No cyanosis, clubbing, or edema Skin:  No rashes, lesions, ulcers palpation of skin: no induration or nodules Neurologic:  CN 2-12 intact Sensation all 4 extremities intact Psychiatric:  Mental status Mood, affect appropriate Orientation to person, place, time  judgment and insight appear intact  I have personally reviewed the following:   Today's Data   Vitals:   12/04/20 2305 12/05/20 1401  BP: 108/76 98/70  Pulse:  (!) 103  Resp:  20  Temp:  97.8 F (36.6 C)  SpO2:  95%   Lab Data  CBC    Component Value Date/Time   WBC 18.8 (H) 12/03/2020 0315   RBC 2.90 (L) 12/03/2020 0315   HGB 8.8 (L) 12/03/2020 0315   HGB 9.5 (L) 10/04/2020 0946   HCT 28.8 (L) 12/03/2020 0315   PLT 274 12/03/2020 0315   PLT 171 10/04/2020 0946   MCV 99.3 12/03/2020 0315   MCH 30.3 12/03/2020 0315   MCHC 30.6 12/03/2020 0315   RDW 17.5 (H) 12/03/2020 0315   LYMPHSABS 0.6 (L) 12/03/2020 0315   MONOABS 0.5 12/03/2020 0315   EOSABS 0.4 12/03/2020 0315   BASOSABS 0.0 12/03/2020 1610   basic metabolic panel  Micro Data  Blood cultures x 2: No growth Sputum culture: Normal respiratory flora  Imaging  CTA chest 12/01/2020: No pulmonary embolus. Large area of consolidation with air bronchogram involving the right lung. 5x7 cm cavitary lesion that is fluid filled representing either a necrotic mass or neoplasm or infectious  process. Mildly enlarged left hilar lymph node. There is a 9 mm left upper lobe nodule which is decreased in size from the prior CT.  Cardiology Data  EKG Atrial fibrillation with RVR.  Scheduled Meds:  amiodarone  150 mg Intravenous Once   docusate sodium  100 mg Oral QHS   feeding supplement  237 mL Oral Q24H   ferrous sulfate  325 mg Oral Q supper   folic acid  1 mg Oral QPM   furosemide  40 mg Intravenous BID   guaiFENesin  600 mg Oral BID   insulin aspart  0-9 Units Subcutaneous TID WC   metoprolol tartrate  25 mg Oral BID   multivitamin with minerals  1 tablet Oral Daily  omega-3 acid ethyl esters  1 g Oral Daily   prednisoLONE acetate  1 drop Left Eye Q12H   sodium chloride flush  3 mL Intravenous Q12H   tamsulosin  0.4 mg Oral Daily   Continuous Infusions:  sodium chloride Stopped (11/30/20 1819)   amiodarone     Followed by   amiodarone     ceFEPime (MAXIPIME) IV 2 g (12/05/20 1408)    Principal Problem:   Severe sepsis with lactic acidosis (HCC) Active Problems:   Type 2 diabetes mellitus (HCC)   HTN (hypertension)   AF (paroxysmal atrial fibrillation) (HCC)   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HCC)   Chronic respiratory failure with hypoxia (HCC)   LOS: 5 days   A & P  Severe sepsis secondary to healthcare associated pneumonia, POA: Patient presented with fever, tachycardia, tachypnea, leukocytosis, and lactic acidosis likely secondary to healthcare associated pneumonia.  Patient meets healthcare associated pneumonia criteria based off of history of cancer getting regular radiation. COVID and influenza negative. Chest x-ray also showed suspicion of possible empyema.  CT angiogram ruled out PE and confirmed very small amount of pain by my in the right side and cavitary lesion in the left lower lobe which is chronic.  Patient himself feels better as far as breathing goes but he continues to feel weak and was equivocal when asked about if he would like  to go home.  He was not opposed to go home however his wife was not comfortable for him to go home as she thinks that he looks much weaker than his baseline.  Has remained afebrile.  Leukocytosis stable.  Still requiring 2 L of oxygen which is his baseline.  Continue current antibiotics.  All the cultures are negative.   Acute on chronic anemia/rectal bleeding: Denies active bleeding however on 12/01/2020, nurse found out streaks of blood on the surface of the stool when he had a large BM.  Based off of the pattern, likely hemorrhoidal which does not need any intervention/colonoscopy at this point in time.  Presented with hemoglobin 6.6 and received 2 units of PRBC transfusion.  Hemoglobin improved to 7.7 posttransfusion and in fact has improved today to 8.8 without any transfusion.   Chronic respiratory failure with hypoxia: Uses 2 L of oxygen at baseline.  He is at his baseline.   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 and radiation oncology, Dr. Sondra Come.  Recently started on palliative radiotherapy.  Missed 3 doses.  Informed Dr. Julien Nordmann about his admission.   Paroxysmal atrial fibrillation: Not on anticoagulation due to transfusion dependent anemia.  Rates intermittently elevated.  Continue Lopressor.  Holding diltiazem due to soft blood pressure.   Type 2 diabetes: Continue to hold metformin.  Continue SSI.   Hypertension: Antihypertensives on hold due to soft blood pressures.   Psoriatic arthritis: Hold methotrexate.   Hypomagnesemia/hypokalemia: Resolved   DVT prophylaxis: SCDs Start: 11/30/20 1950   Code Status: DNR  Family Communication: Wife present at bedside.  Plan of care discussed with patient in length and he verbalized understanding and agreed with it.   Status is: Inpatient   Remains  inpatient appropriate because:Ongoing diagnostic testing needed not appropriate for outpatient work up   Dispo: The patient is from: Home  Anticipated d/c is to: Home              Patient currently is not medically stable to d/c.              Difficult to place patient No   Estimated body mass index is 23.5 kg/m as calculated from the following:   Height as of this encounter: 6\' 2"  (1.88 m).   Weight as of this encounter: 83 kg.   Daja Shuping, DO [facility] Direct contact: see [email or web address]  7PM-7AM contact night coverage as above 12/05/2020, 4:28 PM  LOS: 4 days

## 2020-12-05 NOTE — Progress Notes (Signed)
Pharmacy Antibiotic Note  Jeremy [name] is a 75 y.o. male with hx on 11/30/2020 with squamous cell carcinoma  of right lung presented to the ED on 6/28 with c/o generalized weakness, loss of appetite and fever.  He was started on vancomycin and cefepime on admission for sepsis.  Today, 12/05/2020: SCr WNL, stable. CrCl ~80 mL/min Afebrile  Today is day #6 broad spectrum IV antibiotics.   Plan: Discontinue vancomycin per discussion w/MD Continue cefepime 2 g IV q8h  Renal function stable, pharmacy to sign off. Please re-consult if needed.  ______________________________________  Height: 6\' 2"  (188 cm) Weight: 83 kg (183 lb) IBW/kg (Calculated) : 82.2  Temp (24hrs), Avg:97.6 F (36.4 C), Min:97.3 F (36.3 C), Max:97.8 F (36.6 C)  Recent Labs  Lab 11/30/20 1627 11/30/20 1827 12/01/20 0353 12/02/20 0359 12/03/20 0315 12/04/20 0412  WBC 28.6*  --  24.0* 18.7* 18.8*  --   CREATININE 1.19  --  0.94 0.87 0.94 0.94  LATICACIDVEN 5.0* 1.1  --   --   --   --      Estimated Creatinine Clearance: 77.7 mL/min (by C-G formula based on SCr of 0.94 mg/dL).    Allergies  Allergen Reactions   Naproxen Sodium Hives, Swelling, Rash and Other (See Comments)    "Made the face, lips, and mouth swell- had to go to the E.R."   Pembrolizumab Rash and Other (See Comments)    KEYTRUDA   Chlorhexidine     No central line   Nsaids Hives, Swelling and Rash    6/28 cefepime>> 6/28 vanc>> 7/3 6/28 flagyl>> 6/29  6/28 ucx: 50K Proteus (R nitro) 6/28 bcx x2: ngF 6/30 sputum: normal flora, final  Thank you for allowing pharmacy to be a part of this patient's care.  Lenis Noon, PharmD 12/05/2020 11:23 AM

## 2020-12-06 DIAGNOSIS — R6520 Severe sepsis without septic shock: Secondary | ICD-10-CM

## 2020-12-06 DIAGNOSIS — D649 Anemia, unspecified: Secondary | ICD-10-CM

## 2020-12-06 DIAGNOSIS — J9611 Chronic respiratory failure with hypoxia: Secondary | ICD-10-CM

## 2020-12-06 DIAGNOSIS — A419 Sepsis, unspecified organism: Principal | ICD-10-CM

## 2020-12-06 DIAGNOSIS — E872 Acidosis: Secondary | ICD-10-CM

## 2020-12-06 LAB — CBC WITH DIFFERENTIAL/PLATELET
Abs Immature Granulocytes: 0.31 10*3/uL — ABNORMAL HIGH (ref 0.00–0.07)
Basophils Absolute: 0.1 10*3/uL (ref 0.0–0.1)
Basophils Relative: 1 %
Eosinophils Absolute: 0.4 10*3/uL (ref 0.0–0.5)
Eosinophils Relative: 4 %
HCT: 30.8 % — ABNORMAL LOW (ref 39.0–52.0)
Hemoglobin: 9.5 g/dL — ABNORMAL LOW (ref 13.0–17.0)
Immature Granulocytes: 3 %
Lymphocytes Relative: 6 %
Lymphs Abs: 0.6 10*3/uL — ABNORMAL LOW (ref 0.7–4.0)
MCH: 30.6 pg (ref 26.0–34.0)
MCHC: 30.8 g/dL (ref 30.0–36.0)
MCV: 99.4 fL (ref 80.0–100.0)
Monocytes Absolute: 0.5 10*3/uL (ref 0.1–1.0)
Monocytes Relative: 5 %
Neutro Abs: 7.7 10*3/uL (ref 1.7–7.7)
Neutrophils Relative %: 81 %
Platelets: 241 10*3/uL (ref 150–400)
RBC: 3.1 MIL/uL — ABNORMAL LOW (ref 4.22–5.81)
RDW: 16.8 % — ABNORMAL HIGH (ref 11.5–15.5)
WBC: 9.4 10*3/uL (ref 4.0–10.5)
nRBC: 0 % (ref 0.0–0.2)

## 2020-12-06 LAB — BASIC METABOLIC PANEL
Anion gap: 10 (ref 5–15)
BUN: 37 mg/dL — ABNORMAL HIGH (ref 8–23)
CO2: 28 mmol/L (ref 22–32)
Calcium: 8.9 mg/dL (ref 8.9–10.3)
Chloride: 98 mmol/L (ref 98–111)
Creatinine, Ser: 1.01 mg/dL (ref 0.61–1.24)
GFR, Estimated: >60 mL/min (ref >60)
Glucose, Bld: 138 mg/dL — ABNORMAL HIGH (ref 70–99)
Potassium: 3.5 mmol/L (ref 3.5–5.1)
Sodium: 136 mmol/L (ref 135–145)

## 2020-12-06 LAB — GLUCOSE, CAPILLARY
Glucose-Capillary: 131 mg/dL — ABNORMAL HIGH (ref 70–99)
Glucose-Capillary: 201 mg/dL — ABNORMAL HIGH (ref 70–99)
Glucose-Capillary: 108 mg/dL — ABNORMAL HIGH (ref 70–99)
Glucose-Capillary: 141 mg/dL — ABNORMAL HIGH (ref 70–99)

## 2020-12-06 MED ORDER — METOPROLOL TARTRATE 25 MG PO TABS
25.0000 mg | ORAL_TABLET | Freq: Four times a day (QID) | ORAL | Status: DC
  Administered 2020-12-06 – 2020-12-08 (×8): 25 mg via ORAL
  Filled 2020-12-06 (×8): qty 1

## 2020-12-06 MED ORDER — METOPROLOL TARTRATE 25 MG PO TABS: 25.0000 mg | ORAL_TABLET | Freq: Four times a day (QID) | ORAL | Status: DC

## 2020-12-06 NOTE — Progress Notes (Signed)
Patient ID: Jeremy [name], male   DOB: Oct 09, 1945, 75 y.o.   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  Patient reported productive cough, fever, diaphoresis, chills and poor p.o. intake.  He was thought to be septic related to hospital-acquired pneumonia.   Assessment & Plan:   Principal Problem:   Severe sepsis with lactic acidosis (HCC) Active Problems:   Type 2 diabetes mellitus (HCC)   HTN (hypertension)   AF (paroxysmal atrial fibrillation) (HCC)   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HCC)   Chronic respiratory failure with hypoxia (HCC)  Severe sepsis secondary to healthcare associated pneumonia, POA: Patient presented with fever, tachycardia, tachypnea, leukocytosis, and lactic acidosis likely secondary to healthcare associated pneumonia.  Patient meets healthcare associated pneumonia criteria based off of history of cancer getting regular radiation. COVID and influenza negative.  Blood cultures negative to date Sputum culture showed normal respiratory flora On cefepime day 5    Acute on chronic anemia/rectal bleeding:  Thought to be hemorrhoidal Initial hemoglobin 6.6 Transfused 2 units of packed red blood cells Hemoglobin stable   Chronic respiratory failure with hypoxia:  On 2 L nasal cannula nightly At baseline   Stage III/IV squamous cell carcinoma of the right lung with  potential malignant right pleural effusion and metastatic versus new primary stage IIIa squamous cell carcinoma of the left lower lung: Follows with oncology, [name] Radiation oncology, Dr. Sondra Come.   Recently started on palliative radiotherapy.  Missed 3 doses.  Informed Dr. Julien Nordmann about his admission.   Paroxysmal atrial fibrillation: Not a candidate for anticoagulation due to transfusion dependent anemia Per cardiology will increase metoprolol for rate control Only add diltiazem if absolutely necessary Will they will stop IV amiodarone in the morning   Type 2 diabetes:  Continue to hold metformin.   Continue SSI.   Hypertension: On Toprol and diltiazem at home Holding diltiazem due to soft blood pressures   Psoriatic arthritis: Hold methotrexate.   Hypomagnesemia/hypokalemia:  Replete and trend   DVT prophylaxis: SCD/Compression stockings Code Status: DNR wife and patient decline palliative care for now Family Communication: Wife at bedside Disposition Plan: Home versus inpatient rehab versus SNF.  Currently meets criteria for inpatient because he is on a IV drip for rate control of his heart rate.  The wife is stating that she does not think she can care for him at home because he does not seem to get out of the bed and states there for 23 hours a day.  PT recommended SNF   Consultants:  Cardiology  Procedures: None  Antimicrobials: Anti-infectives (From admission, onward)    Start     Dose/Rate Route Frequency Ordered Stop   12/02/20 1200  vancomycin (VANCOREADY) IVPB 1000 mg/200 mL  Status:  Discontinued        1,000 mg 200 mL/hr over 60 Minutes Intravenous Every 12 hours 12/02/20 0954 12/05/20 1122   12/01/20 1800  vancomycin (VANCOREADY) IVPB  1500 mg/300 mL  Status:  Discontinued        1,500 mg 150 mL/hr over 120 Minutes Intravenous Every 24 hours 11/30/20 2004 12/02/20 0954   12/01/20 0000  ceFEPIme (MAXIPIME) 2 g in sodium chloride 0.9 % 100 mL IVPB         2 g 200 mL/hr over 30 Minutes Intravenous Every 8 hours 11/30/20 2005     11/30/20 2000  metroNIDAZOLE (FLAGYL) IVPB 500 mg  Status:  Discontinued        500 mg 100 mL/hr over 60 Minutes Intravenous Every 8 hours 11/30/20 1951 12/01/20 1336   11/30/20 1630  vancomycin (VANCOREADY) IVPB 1750 mg/350 mL        1,750 mg 175 mL/hr over 120 Minutes Intravenous  Once 11/30/20 1626 11/30/20 2007   11/30/20 1630  ceFEPIme (MAXIPIME) 2 g in sodium chloride 0.9 % 100 mL IVPB        2 g 200 mL/hr over 30 Minutes Intravenous  Once 11/30/20 1626 11/30/20 1752        Subjective: He reports that he is feeling better.  His respiratory status feels back to baseline.  He denies chest pain  Objective: Vitals:   12/05/20 2002 12/05/20 2128 12/06/20 0641 12/06/20 0812  BP: 101/68 109/68 101/70 112/76  Pulse: 93 95 85   Resp: 16  18   Temp: 97.9 F (36.6 C)  98.6 F (37 C)   TempSrc:      SpO2: 95%  94%   Weight:      Height:        Intake/Output Summary (Last 24 hours) at 12/06/2020 1105 Last data filed at 12/06/2020 0655 Gross per 24 hour  Intake 1000 ml  Output 1250 ml  Net -250 ml   Filed Weights   11/30/20 1540  Weight: 83 kg    Examination:  General exam: Appears calm and comfortable  Respiratory system: Clear to auscultation. Respiratory effort normal. Cardiovascular system: S1 & S2 heard, irregularly irregular.  Gastrointestinal system: Abdomen is nondistended, soft and nontender.  Central nervous system: Alert and oriented. No focal neurological deficits. Extremities: Symmetric  Skin: No rashes Psychiatry: Judgement and insight appear normal. Mood & affect appropriate.     Data Reviewed: I have personally reviewed following labs and imaging studies  CBC: Recent Labs  Lab 11/30/20 1627 12/01/20 0353 12/01/20 1602 12/02/20 0359 12/03/20 0315 12/06/20 0713  WBC 28.6* 24.0*  --  18.7* 18.8* 9.4  NEUTROABS 26.4*  --   --  17.1* 16.9* 7.7  HGB 6.6* 6.8* 7.7* 7.7*  8.8* 9.5*  HCT 21.9* 22.6* 24.9* 24.9* 28.8* 30.8*  MCV 104.8* 102.7*  --  98.8 99.3 99.4  PLT 374 293  --  279 274 419   Basic Metabolic Panel: Recent Labs  Lab 12/01/20 0353 12/02/20 0359 12/03/20 0315 12/04/20 0412 12/06/20 0713  NA 139 137 137 135 136  K 4.3 3.0* 3.5 3.1* 3.5  CL 106 99 99 94* 98  CO2 24 24 27 29 28   GLUCOSE 136* 137* 148* 130* 138*  BUN 21 17 25* 29* 37*  CREATININE 0.94 0.87 0.94 0.94 1.01  CALCIUM 8.9 8.9 9.1 9.0 8.9  MG  --  1.3* 1.8  --   --    GFR: Estimated Creatinine Clearance: 72.3 mL/min (by C-G formula based on SCr of 1.01 mg/dL). Liver Function Tests: Recent Labs  Lab 11/30/20 1627  AST 18  ALT 19  ALKPHOS 72  BILITOT 0.4  PROT 7.0  ALBUMIN 2.4*    Coagulation Profile: Recent Labs  Lab 11/30/20 1627  INR 1.1    CBG: Recent Labs  Lab 12/05/20 0825 12/05/20 1212 12/05/20 1657 12/05/20 2059 12/06/20 0752  GLUCAP 135* 137* 236* 140* 131*    Sepsis Labs: Recent Labs  Lab 11/30/20 1627 11/30/20 1827 12/01/20 0353 12/02/20 0359  PROCALCITON  --   --  0.99 0.80  LATICACIDVEN 5.0* 1.1  --   --     Recent Results (from the past 240 hour(s))  Urine culture     Status: Abnormal   Collection Time: 11/30/20  4:27 PM   Specimen: In/Out Cath Urine  Result Value Ref Range Status   Specimen Description   Final    IN/OUT CATH URINE Performed at [facility], [place] [address] [place], [place] [address]    Special Requests   Final    NONE Performed at [facility], [place] [address], [place], [place] [address]    Culture 50,000 COLONIES/mL PROTEUS MIRABILIS (A)  Final   Report Status 12/03/2020 FINAL  Final   Organism ID, Bacteria PROTEUS MIRABILIS (A)  Final      Susceptibility   Proteus mirabilis - MIC*    AMPICILLIN <=2 SENSITIVE Sensitive     CEFAZOLIN <=4 SENSITIVE Sensitive     CEFEPIME <=0.12 SENSITIVE Sensitive     CEFTRIAXONE <=0.25 SENSITIVE Sensitive     CIPROFLOXACIN  <=0.25 SENSITIVE Sensitive     GENTAMICIN <=1 SENSITIVE Sensitive     IMIPENEM 2 SENSITIVE Sensitive     NITROFURANTOIN RESISTANT Resistant     TRIMETH/SULFA <=20 SENSITIVE Sensitive     AMPICILLIN/SULBACTAM <=2 SENSITIVE Sensitive     PIP/TAZO <=4 SENSITIVE Sensitive     * 50,000 COLONIES/mL PROTEUS MIRABILIS  Blood Culture (routine x 2)     Status: None   Collection Time: 11/30/20  4:27 PM   Specimen: BLOOD  Result Value Ref Range Status   Specimen Description   Final    BLOOD LEFT ANTECUBITAL Performed at [place] [address] [place], [place] [address]    Special Requests   Final    BOTTLES DRAWN AEROBIC AND ANAEROBIC Blood Culture adequate volume Performed at [place] [address], [place], [place] [address]    Culture   Final    NO GROWTH 5 DAYS Performed at [facility], [place] [address], [place], [place] [address]    Report Status 12/05/2020 FINAL  Final  Blood Culture (routine x 2)     Status: None   Collection Time: 11/30/20  4:27 PM   Specimen: BLOOD  Result Value Ref Range Status   Specimen Description   Final    BLOOD RIGHT ANTECUBITAL Performed at [place] [address], [place], [place] [address]    Special Requests   Final    BOTTLES DRAWN AEROBIC AND ANAEROBIC Blood Culture adequate volume Performed at [place] [address] [place], [place] [address]    Culture   Final    NO GROWTH 5 DAYS Performed at [facility], [place] [address], [place], [place] [address]    Report Status 12/05/2020 FINAL  Final  Resp Panel by RT-PCR (Flu A&B, Covid)     Status: None   Collection Time: 11/30/20  4:27 PM   Specimen: Nasopharyngeal(NP) swabs in vial transport medium  Result Value Ref Range Status   SARS Coronavirus 2 by RT PCR  NEGATIVE NEGATIVE Final    Comment: (NOTE) SARS-CoV-2 target nucleic acids are NOT DETECTED.  The SARS-CoV-2 RNA is generally  detectable in upper respiratory specimens during the acute phase of infection. The lowest concentration of SARS-CoV-2 viral copies this assay can detect is 138 copies/mL. A negative result does not preclude SARS-Cov-2 infection and should not be used as the sole basis for treatment or other patient management decisions. A negative result may occur with  improper specimen collection/handling, submission of specimen other than nasopharyngeal swab, presence of viral mutation(s) within the areas targeted by this assay, and inadequate number of viral copies(<138 copies/mL). A negative result must be combined with clinical observations, patient history, and epidemiological information. The expected result is Negative.  Fact Sheet for Patients:  EntrepreneurPulse.com.au  Fact Sheet for Healthcare Providers:  IncredibleEmployment.be  This test is no t yet approved or cleared by the [place] FDA and  has been authorized for detection and/or diagnosis of SARS-CoV-2 by FDA under an Emergency Use Authorization (EUA). This EUA will remain  in effect (meaning this test can be used) for the duration of the COVID-19 declaration under Section 564(b)(1) of the Act, 21 U.S.C.section 360bbb-3(b)(1), unless the authorization is terminated  or revoked sooner.       Influenza A by PCR NEGATIVE NEGATIVE Final   Influenza B by PCR NEGATIVE NEGATIVE Final    Comment: (NOTE) The Xpert Xpress SARS-CoV-2/FLU/RSV plus assay is intended as an aid in the diagnosis of influenza from Nasopharyngeal swab specimens and should not be used as a sole basis for treatment. Nasal washings and aspirates are unacceptable for Xpert Xpress SARS-CoV-2/FLU/RSV testing.  Fact Sheet for Patients: EntrepreneurPulse.com.au  Fact Sheet for Healthcare Providers: IncredibleEmployment.be  This test is not yet approved or cleared by the [place] FDA  and has been authorized for detection and/or diagnosis of SARS-CoV-2 by FDA under an Emergency Use Authorization (EUA). This EUA will remain in effect (meaning this test can be used) for the duration of the COVID-19 declaration under Section 564(b)(1) of the Act, 21 U.S.C. section 360bbb-3(b)(1), unless the authorization is terminated or revoked.  Performed at [facility], [place] [address] [place], [place] [address]   MRSA PCR Screening     Status: None   Collection Time: 12/01/20  8:00 PM  Result Value Ref Range Status   MRSA by PCR NEGATIVE NEGATIVE Final    Comment:        The GeneXpert MRSA Assay (FDA approved for NASAL specimens only), is one component of a comprehensive MRSA colonization surveillance program. It is not intended to diagnose MRSA infection nor to guide or monitor treatment for MRSA infections. Performed at [facility], [place] [address] [place], [place] [address]   Expectorated Sputum Assessment w Gram Stain, Rflx to Resp Cult     Status: None   Collection Time: 12/02/20  6:30 AM   Specimen: Expectorated Sputum  Result Value Ref Range Status   Specimen Description EXPECTORATED SPUTUM  Final   Special Requests NONE  Final   Sputum evaluation   Final    Sputum specimen not acceptable for testing.  Please recollect.   INFORM COMMING K. RN  ON 12/02/2020 @ 0706 BY MECIAL J. Performed at [facility],  [address], [place], [place] [address]    Report Status 12/02/2020 FINAL  Final  Expectorated Sputum Assessment w Gram Stain, Rflx to Resp Cult     Status: None   Collection Time: 12/02/20  12:07 PM  Result Value Ref Range Status   Specimen Description EXPECTORATED SPUTUM  Final   Special Requests NONE  Final   Sputum evaluation   Final    THIS SPECIMEN IS ACCEPTABLE FOR SPUTUM CULTURE Performed at [facility], [place] [address] [place], [place] [address]    Report Status  12/02/2020 FINAL  Final  Culture, Respiratory w Gram Stain     Status: None   Collection Time: 12/02/20 12:07 PM  Result Value Ref Range Status   Specimen Description   Final    EXPECTORATED SPUTUM Performed at [facility], [place] [address] [place], [place] [address]    Special Requests   Final    NONE Reflexed from [id number] Performed at [facility], [place] [address] [place], [place] [address]    Gram Stain   Final    ABUNDANT WBC PRESENT,BOTH PMN AND MONONUCLEAR MODERATE GRAM NEGATIVE RODS MODERATE GRAM POSITIVE COCCI FEW GRAM VARIABLE ROD    Culture   Final    RARE Normal respiratory flora-no Staph aureus or Pseudomonas seen Performed at [facility], [address] [place], [place] [address]    Report Status 12/04/2020 FINAL  Final      Radiology Studies: No results found.   Scheduled Meds:  docusate sodium  100 mg Oral QHS   feeding supplement  237 mL Oral Q24H   ferrous sulfate  325 mg Oral Q supper   folic acid  1 mg Oral QPM   guaiFENesin  600 mg Oral BID   insulin aspart  0-9 Units Subcutaneous TID WC   metoprolol tartrate  25 mg Oral BID   multivitamin with minerals  1 tablet Oral Daily   omega-3 acid ethyl esters  1 g Oral Daily   prednisoLONE acetate  1 drop Left Eye Q12H   sodium chloride flush  3 mL Intravenous Q12H   tamsulosin  0.4 mg Oral Daily   Continuous Infusions:  sodium chloride Stopped (11/30/20 1819)   amiodarone 30 mg/hr (12/06/20 0521)   ceFEPime (MAXIPIME) IV 2 g (12/06/20 0520)     LOS: 6 days    Donnamae Jude, MD 12/06/2020 11:05 AM [phone] [facility] If 7PM-7AM, please contact night-coverage 12/06/2020, 11:05 AM

## 2020-12-06 NOTE — Progress Notes (Signed)
Occupational Therapy Treatment Patient Details Name: Jeremy [name] MRN: [id number] DOB: 08-22-1945 Today's Date: 12/06/2020    History of present illness Patient admitted for sepsis. Jeremy [name] is a 75 y.o. 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     Patient recently started palliative radiotherapy for his known lung cancer. He has not been able to attend his last 3 radiation treatments. He reports occasional lightheadedness.   OT comments  Patient demonstrated progress towards goals on this date. Patient was min A to min guard for supine to sit on edge of bed with education on proper positioning. Patient reported feeling dizziness on edge of bed with BP of 97/59 mmhg sitting on edge of bed. Patient required min guard for sit to stand with rolling walker with blood pressure of 112/80 mmhg in standing. Patient denied having more dizziness at this time. Patient required  min guard to transfer from standing in front of bed to recliner in room. Patient completed oral care tasks in standing with rolling walker and min guard with HR in 90s. Patient tolerated session well. Patients plan remains to transition home with wife and [facility] services at time of d/c.    Follow Up Recommendations  Home health OT    Equipment Recommendations  None recommended by OT          Precautions / Restrictions Precautions Precautions: Fall;Other (comment) Precaution Comments: hx of falls, monitor HR       Mobility Bed Mobility Overal bed mobility: Needs Assistance       Supine to sit: Min assist     General  bed mobility comments: Min assist for hand hold to transfer into sitting.    Transfers Overall transfer level: Needs assistance Equipment used: Rolling walker (2 wheeled) Transfers: Sit to/from Stand Sit to Stand: Min guard;From elevated surface Stand pivot transfers: Min assist                                                       ADL either performed or assessed with clinical judgement   ADL       Grooming: Set up;Min guard;Oral care Grooming Details (indicate cue type and reason): standing with rolling walker with bedside table at recliner.                                     Vision Patient Visual Report: No change from baseline                Cognition Arousal/Alertness: Awake/alert Behavior During Therapy: WFL for tasks assessed/performed Overall Cognitive Status: Within Functional Limits for tasks assessed  Pertinent Vitals/ Pain       Pain Assessment: No/denies pain                                                          Frequency  Min 2X/week        Progress Toward Goals  OT Goals(current goals can now be found in the care plan section)  Progress towards OT goals: Progressing toward goals  Acute Rehab OT Goals Patient Stated Goal: to ambulate to bathroom OT Goal Formulation: With patient Time For Goal Achievement: 12/16/20 Potential to Achieve Goals: Good  Plan Discharge plan remains appropriate                     AM-PAC OT "6 Clicks" Daily Activity     Outcome Measure   Help from another person eating meals?: None Help from another person taking care of personal grooming?: A Little   Help from another person bathing (including washing, rinsing, drying)?: A Little Help from another person to put on and taking off regular upper body clothing?: A Lot Help from another person to put on  and taking off regular lower body clothing?: A Lot 6 Click Score: 14    End of Session Equipment Utilized During Treatment: Rolling walker;Gait belt  OT Visit Diagnosis: Unsteadiness on feet (R26.81);Muscle weakness (generalized) (M62.81)   Activity Tolerance Patient tolerated treatment well   Patient Left in chair;with call bell/phone within reach;with family/visitor present   Nurse Communication Other (comment) (nurse clearded patient to participate in session)        Time: 6811-5726 OT Time Calculation (min): 27 min  Charges: OT Treatments $Self Care/Home Management : 8-22 mins $Therapeutic Activity: 8-22 mins  Jeremy [name] OTR/L, MS Acute Rehabilitation Department Office# [phone] Pager# [phone]    [place] 12/06/2020, 12:40 PM

## 2020-12-06 NOTE — Consult Note (Signed)
Cardiology Consultation:   Patient ID: Jeremy [name] MRN: [id number]; DOB: [birth year]  Admit date: 11/30/2020 Date of Consult: 12/06/2020  PCP:  [name], MD   [facility] Prov[facility] Cardiologist:  Carlyle Dolly, MD        Patient Profile:   Jeremy [name] is a 75 y.o. male with a hx of lung cancer, transfusion dependent anemia, COPD, chronic hypoxic respiratory failure on on nighttime O2 who is being seen 12/06/2020 for the evaluation of atrial fibrillation at the request of Dr. Benny Lennert.  History of Present Illness:   Jeremy [name] presented with generalized symptoms including weakness, cough, fevers, diaphoresis, and lightheadedness. In the ER, he was febrile to 70 F with a leukocytosis and anemia. He had lactic acidosis with a lactate of 5. Findings consistent with severe sepsis. He was borderline hypotensive, and his home metoprolol and diltiazem were held. However, his heart rates continued to rise, and on 7.3 he was noted to be in atrial fibrillation with RVR. He was started on IV amiodarone at that time.  His wife, a retired Marine scientist, is present at bedside. She also has afib, and he notes that he is always asymptomatic with his afib, while his wife is not. We discussed rate vs. Rhythm control and risk of stroke today. He cannot tell when his rate is elevated. He generally does not have much energy, but he is feeling better now than when he came in the hospital.   Denies chest pain, shortness of breath at rest or with normal exertion. No PND, orthopnea, LE edema or unexpected weight gain. No syncope or palpitations.    Past Medical History:  Diagnosis Date   Anemia    Cancer ([place])    mass right lung   COPD (chronic obstructive pulmonary disease) ([place])    QUIT SMOKING 30 YRS AGO   Diabetes ([place]) 2018   Dysrhythmia    PAF   Empyema ([place])    History of radiation therapy 08/11/2019 through 08/28/2019   right lung        Dr Gery Pray   HTN (hypertension)    Hyperlipidemia     Neuromuscular disorder ([place])    PONV (postoperative nausea and vomiting)    Psoriatic arthritis ([place]) 2015   Sepsis ([place])    possibly from right port and traveled to eye    Past Surgical History:  Procedure Laterality Date   [place]   BIOPSY  12/30/2018   Procedure: BIOPSY;  Surgeon: Danie Binder, MD;  [facility];  Service: Endoscopy;;  gastric   CATARACT EXTRACTION Bilateral    CERVICAL SPINE SURGERY  1995   COLONOSCOPY WITH PROPOFOL N/A 12/30/2018   Procedure: COLONOSCOPY WITH PROPOFOL;  Surgeon: Danie Binder, MD;  [facility];  Service: Endoscopy;  Laterality: N/A;  12:30pm   ESOPHAGOGASTRODUODENOSCOPY (EGD) WITH PROPOFOL N/A 12/30/2018   Procedure: ESOPHAGOGASTRODUODENOSCOPY (EGD) WITH PROPOFOL;  Surgeon: Danie Binder, MD;  [facility];  Service: Endoscopy;  Laterality: N/A;   EYE SURGERY     detached retina   IR IMAGING GUIDED PORT INSERTION  09/04/2019   LUMBAR LAMINECTOMY/ DECOMPRESSION WITH MET-RX Right 08/30/2020   Procedure: Right Lumbar three-four Microdiscectomy;  Surgeon: Kristeen Miss, MD;  Location: [facility];  Service: Neurosurgery;  Laterality: Right;   POLYPECTOMY  12/30/2018   Procedure: POLYPECTOMY;  Surgeon: Danie Binder, MD;  [facility];  Service: Endoscopy;;  colon   THORACENTESIS Right 08/08/2019  Procedure: Thoracentesis;  Surgeon: [name], MD;  Location: [facility];  Service: Pulmonary;  Laterality: Right;   VIDEO BRONCHOSCOPY Right 08/08/2019   Procedure: Video Bronchoscopy with Erbe Cryo Biopsy of Right mainstem;  Surgeon: [name], MD;  Location: [facility] OR;  Service: Pulmonary;  Laterality: Right;     Home Medications:  Prior to Admission medications   Medication Sig Start Date End Date Taking? Authorizing Provider  acetaminophen (TYLENOL) 325 MG tablet Take 2 tablets (650 mg total) by mouth every 6 (six) hours as needed for mild pain, fever or headache. Patient taking differently: Take  325 mg by mouth every 4 (four) hours as needed for mild pain, fever or headache. 10/28/19  Yes Emokpae, Courage, MD  albuterol (VENTOLIN HFA) 108 (90 Base) MCG/ACT inhaler Inhale 2 puffs into the lungs every 6 (six) hours as needed for wheezing or shortness of breath.   Yes [provider]  bisacodyl (DULCOLAX) 5 MG EC tablet Take 10 mg by mouth daily as needed for moderate constipation.   Yes [provider]  cyclobenzaprine (FLEXERIL) 10 MG tablet Take 10 mg by mouth at bedtime as needed for muscle spasms. 11/23/19  Yes [provider]  diltiazem (CARDIZEM CD) 360 MG 24 hr capsule TAKE 1 CAPSULE BY MOUTH EVERY DAY Patient taking differently: Take 360 mg by mouth daily. 09/23/20  Yes BranchAlphonse Guild, MD  docusate sodium (COLACE) 100 MG capsule Take 100 mg by mouth at bedtime.   Yes [provider]  ferrous sulfate 325 (65 FE) MG tablet Take 325 mg by mouth daily with supper.   Yes [provider]  folic acid (FOLVITE) 1 MG tablet Take 1 mg by mouth every evening.   Yes [provider]  gabapentin (NEURONTIN) 300 MG capsule Take 600 mg by mouth at bedtime as needed (for neuropathy).   Yes [provider]  meclizine (ANTIVERT) 25 MG tablet Take 25 mg by mouth 3 (three) times daily as needed for dizziness.   Yes [provider]  metFORMIN (GLUCOPHAGE-XR) 500 MG 24 hr tablet Take 500-1,000 mg by mouth See admin instructions. Take 1,000 mg by mouth in the morning and 500 mg at bedtime 10/15/18  Yes [provider]  methotrexate (RHEUMATREX) 2.5 MG tablet Take 10 mg by mouth every Sunday. Caution:Chemotherapy. Protect from light.   Yes [provider]  metoprolol tartrate (LOPRESSOR) 25 MG tablet Take 1 tablet (25 mg total) by mouth 2 (two) times daily. 07/23/20  Yes Branch, Alphonse Guild, MD  Multiple Vitamins-Minerals (CENTRUM ADULTS PO) Take 1 tablet by mouth daily at 6 (six) AM.   Yes [provider]  Omega-3  Fatty Acids (FISH OIL) 1000 MG CAPS Take 1,000 mg by mouth daily.    Yes [provider]  oxyCODONE-acetaminophen (PERCOCET) 10-325 MG tablet Take 1 tablet by mouth every 6 (six) hours as needed for pain.   Yes [provider]  prednisoLONE acetate (PRED FORTE) 1 % ophthalmic suspension Place 1 drop into the left eye in the morning and at bedtime.   Yes [provider]  tamsulosin (FLOMAX) 0.4 MG CAPS capsule Take 0.4 mg by mouth daily. 03/16/20  Yes [provider]  traMADol (ULTRAM) 50 MG tablet Take 50 mg by mouth every 4 (four) hours as needed for moderate pain. 08/19/18  Yes [provider]  azithromycin (ZITHROMAX) 250 MG tablet Take 1 tablet by mouth daily for 3 more days Patient not taking: No sig reported 10/22/20  Donne Hazel, MD    Inpatient Medications: Scheduled Meds:  docusate sodium  100 mg Oral QHS   feeding supplement  237 mL Oral Q24H   ferrous sulfate  325 mg Oral Q supper   folic acid  1 mg Oral QPM   guaiFENesin  600 mg Oral BID   insulin aspart  0-9 Units Subcutaneous TID WC   metoprolol tartrate  25 mg Oral Q6H   multivitamin with minerals  1 tablet Oral Daily   omega-3 acid ethyl esters  1 g Oral Daily   prednisoLONE acetate  1 drop Left Eye Q12H   sodium chloride flush  3 mL Intravenous Q12H   tamsulosin  0.4 mg Oral Daily   Continuous Infusions:  sodium chloride Stopped (11/30/20 1819)   amiodarone 30 mg/hr (12/06/20 0521)   ceFEPime (MAXIPIME) IV 2 g (12/06/20 0520)   PRN Meds: acetaminophen **OR** acetaminophen, albuterol, bisacodyl, calcium carbonate, cyclobenzaprine, gabapentin, meclizine, oxyCODONE-acetaminophen **AND** oxyCODONE, traMADol  Allergies:    Allergies  Allergen Reactions   Naproxen Sodium Hives, Swelling, Rash and Other (See Comments)    "Made the face, lips, and mouth swell- had to go to the E.R."   Pembrolizumab Rash and Other (See Comments)    KEYTRUDA   Chlorhexidine     No central  line   Nsaids Hives, Swelling and Rash    Social History:   Social History   Socioeconomic History   Marital status: Married    Spouse name: Not on file   Number of children: Not on file   Years of education: Not on file   Highest education level: Not on file  Occupational History   Not on file  Tobacco Use   Smoking status: Former    Packs/day: 0.50    Years: 50.00    Pack years: 25.00    Types: Cigarettes    Quit date: 12/26/1991    Years since quitting: 28.9   Smokeless tobacco: Never  Vaping Use   Vaping Use: Never used  Substance and Sexual Activity   Alcohol use: Yes    Comment: occ beer   Drug use: Never   Sexual activity: Yes    Birth control/protection: None  Other Topics Concern   Not on file  Social History Narrative   RETIRED: WORKED [employer]. NO KIDS. [id number]). 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   Social Determinants of Health   Financial Resource Strain: Not on file  Food Insecurity: Not on file  Transportation Needs: Not on file  Physical Activity: Not on file  Stress: Not on file  Social Connections: Not on file  Intimate Partner Violence: Not on file    Family History:    Family History  Problem Relation Age of Onset   Alcoholism Father    CAD Brother    Diabetes Brother    Colon cancer Neg Hx    Colon polyps Neg Hx    Gastric cancer Neg Hx      ROS:  Please see the history of present illness.  Constitutional: positive for chills, fever, night sweats, unintentional weight loss  HENT: Negative for ear pain and hearing loss.   Eyes: Negative for loss of vision and eye pain.  Respiratory: positive for cough, sputum,  Cardiovascular: See HPI. Gastrointestinal: Negative for abdominal pain, melena. Positive for mild hematochezia.  Genitourinary: Negative for dysuria and hematuria.  Musculoskeletal: Negative for falls and myalgias.  Skin: Negative for itching and rash.  Neurological: Negative  for focal weakness, focal sensory changes and loss of consciousness.  Endo/Heme/Allergies: Does bruise/bleed easily.   All other ROS reviewed and negative.     Physical Exam/Data:   Vitals:   12/05/20 2002 12/05/20 2128 12/06/20 0641 12/06/20 0812  BP: 101/68 109/68 101/70 112/76  Pulse: 93 95 85   Resp: 16  18   Temp: 97.9 F (36.6 C)  98.6 F (37 C)   TempSrc:      SpO2: 95%  94%   Weight:      Height:        Intake/Output Summary (Last 24 hours) at 12/06/2020 1209 Last data filed at 12/06/2020 0655 Gross per 24 hour  Intake 1000 ml  Output 1250 ml  Net -250 ml   Last 3 Weights 11/30/2020 11/10/2020 11/04/2020  Weight (lbs) 183 lb 183 lb 2 oz 185 lb 3.2 oz  Weight (kg) 83.008 kg 83.065 kg 84.006 kg     Body mass index is 23.5 kg/m.  General:  Well nourished, well developed, in no acute distress HEENT: normal, [place] off of nose Neck: no JVD appreciated Vascular: No carotid bruits; RA pulses 2+ bilaterally Cardiac:  distant, largely regular normal S1, S2; no murmur appreciated Lungs:  distant, coarse without wheezing bilaterally Abd: soft, nontender, no hepatomegaly  Ext: no edema Musculoskeletal:  No deformities, BUE and BLE strength normal and equal Skin: warm and dry  Neuro:  CNs 2-12 intact, no focal abnormalities noted Psych:  Normal affect   EKG:  The EKG was personally reviewed and demonstrates:  12/05/20 afib RVR at 136 bpm Telemetry:  Telemetry was personally reviewed and demonstrates:  rate controlled atrial fibrillation with intermittent sinus rhythm with 1st degree AV block  Relevant CV Studies: Echo 10/09/19  1. Left ventricular ejection fraction, by estimation, is 60 to 65%. The  left ventricle has normal function. Left ventricular endocardial border  not optimally defined to evaluate regional wall motion. Left ventricular  diastolic parameters are consistent  with Grade I diastolic dysfunction (impaired relaxation).   2. Right ventricular systolic function is  normal. The right ventricular  size is moderately enlarged.   3. Right atrial size was mild to moderately dilated.   4. The mitral valve was not well visualized. Trivial mitral valve  regurgitation.   5. Tricuspid valve regurgitation unable to assess.   6. The aortic valve was not well visualized. Aortic valve regurgitation  is mild.   7. Pulmonic valve regurgitation unable to assess.   8. Aortic dilatation noted. There is mild dilatation of the aortic root.   Laboratory Data:  High Sensitivity Troponin:  No results for input(s): TROPONINIHS in the last 720 hours.   Chemistry Recent Labs  Lab 12/03/20 0315 12/04/20 0412 12/06/20 0713  NA 137 135 136  K 3.5 3.1* 3.5  CL 99 94* 98  CO2 _0 GLUCOSE 148* 130* 138*  BUN 25* 29* 37*  CREATININE 0.94 0.94 1.01  CALCIUM 9.1 9.0 8.9  GFRNONAA >60 >60 >60  ANIONGAP _1 Recent Labs  Lab 11/30/20 1627  PROT 7.0  ALBUMIN 2.4*  AST 18  ALT 19  ALKPHOS 72  BILITOT 0.4   Hematology Recent Labs  Lab 12/02/20 0359 12/03/20 0315 12/06/20 0713  WBC 18.7* 18.8* 9.4  RBC 2.52* 2.90* 3.10*  HGB 7.7* 8.8* 9.5*  HCT 24.9* 28.8* 30.8*  MCV 98.8 99.3 99.4  MCH 30.6 30.3 30.6  MCHC 30.9 30.6 30.8  RDW 18.0* 17.5* 16.8*  PLT 279 274 241   BNPNo results for input(s): BNP, PROBNP in the last 168 hours.  DDimer No results for input(s): DDIMER in the last 168 hours.   Radiology/Studies:  No results found.   Assessment and Plan:   Atrial fibrillation with RVR -chadsvasc 4, has not been a candidate for anticoagulation due to transfusion dependent anemia -strategy has been rate control as an outpatient. However, likely due to factors below, he has gone into RVR this admission -continues on metoprolol tartrate 25 mg BID currently -started on IV amiodarone 7/3 -home medications are metoprolol 25 mg BID and diltiazem 360 mg daily; diltiazem remains on hold -there is risk of chemical cardioversion with amiodarone, and  he cannot be anticoagulated. Given this, would work on rate control. Would increase metoprolol to 25 mg q6 with hold parameters, can continue to uptitrate if needed as blood pressure tolerates. Would add diltiazem only if needed for additional rate control once he is on maximum dose metoprolol, as there is more likelihood for hypotension with diltiazem than metoprolol. Would use amiodarone only if he cannot be rate controlled by other means or is hemodynamically unstable. -we discussed options at length today. Wife, a retired Marine scientist, is concerned about long term amiodarone. After shared decision making, will increase metoprolol as above, plan to stop amiodarone tomorrow AM and aim for rate control. If he cannot be rate controlled on metoprolol and/or diltiazem, then they would reconsider amiodarone as an outpatient  Likely exacerbated by: Sepsis of unclear etiology Lung cancer COPD with chronic hypoxic respiratory failure Acute on chronic anemia, transfusion required   Risk Assessment/Risk Scores:    CHA2DS2-VASc Score = 4  This indicates a 4.8% annual risk of stroke. The patient's score is based upon: CHF History: No HTN History: Yes Diabetes History: Yes Stroke History: No Vascular Disease History: No Age Score: 2 Gender Score: 0      For questions or updates, please contact [place] Please consult [email or web address] for contact info under    Signed, Buford Dresser, MD  12/06/2020 12:09 PM

## 2020-12-06 NOTE — Progress Notes (Signed)
Physical Therapy Treatment Patient Details Name: Jeremy [name] MRN: [id number] DOB: 06-Feb-1946 Today's Date: 12/06/2020    History of Present Illness Patient admitted for sepsis. Jeremy [name] is a 75 y.o. 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     Patient recently started palliative radiotherapy for his known lung cancer. He has not been able to attend his last 3 radiation treatments. He reports occasional lightheadedness.    PT Comments    Pt progressing toward PT goals. HR controlled during session today. Pt continues to require assist with all aspects of mobility. Will benefit from SNF post acute, wife in agreement.    Follow Up Recommendations  SNF     Equipment Recommendations  None recommended by PT    Recommendations for Other Services       Precautions / Restrictions Precautions Precautions: Fall Precaution Comments: hx of falls, monitor HR Restrictions Weight Bearing Restrictions: No    Mobility  Bed Mobility Overal bed mobility: Needs Assistance Bed Mobility: Supine to Sit     Supine to sit: Supervision;Min guard     General bed mobility comments: pt able to perform without assist  with cues and incr time    Transfers Overall transfer level: Needs assistance Equipment used: Rolling walker (2 wheeled) Transfers: Sit to/from [facility] Sit to Stand: Min assist;From elevated surface Stand pivot transfers: Min assist       General transfer comment: min assist to power up to standing, incr time, cues for hand placement; SPT bed <>  BSC  Ambulation/Gait             General Gait Details: pivotal and lateral steps bed<> chair . cues for sequence and RW position   Stairs             Wheelchair Mobility    Modified Rankin (Stroke Patients Only)       Balance                                            Cognition Arousal/Alertness: Awake/alert Behavior During Therapy: WFL for tasks assessed/performed Overall Cognitive Status: Within Functional Limits for tasks assessed                                        Exercises General Exercises - Lower Extremity Ankle Circles/Pumps: AROM;Both;15 reps Long Arc Quad: AROM;Both;Strengthening;15 reps Hip Flexion/Marching: AROM;Both;10 reps    General Comments        Pertinent Vitals/Pain Pain Assessment: No/denies pain    Home Living                      Prior Function            PT Goals (current goals can now be found in the care plan section) Acute Rehab PT Goals Patient Stated Goal: to ambulate to bathroom PT Goal Formulation: With patient Time For Goal Achievement: 12/04/20 Potential to  Achieve Goals: Good Progress towards PT goals: Progressing toward goals    Frequency    Min 3X/week      PT Plan Current plan remains appropriate    Co-evaluation              AM-PAC PT "6 Clicks" Mobility   Outcome Measure  Help needed turning from your back to your side while in a flat bed without using bedrails?: A Little Help needed moving from lying on your back to sitting on the side of a flat bed without using bedrails?: A Little Help needed moving to and from a bed to a chair (including a wheelchair)?: A Lot Help needed standing up from a chair using your arms (e.g., wheelchair or bedside chair)?: A Lot Help needed to walk in hospital room?: A Lot Help needed climbing 3-5 steps with a railing? : Total 6 Click Score: 13    End of Session   Activity Tolerance: Patient tolerated  treatment well Patient left: in bed;with call bell/phone within reach;with bed alarm set Nurse Communication: Mobility status PT Visit Diagnosis: Muscle weakness (generalized) (M62.81);Difficulty in walking, not elsewhere classified (R26.2)     Time: 1435-1500 PT Time Calculation (min) (ACUTE ONLY): 25 min  Charges:  $Therapeutic Exercise: 8-22 mins $Therapeutic Activity: 8-22 mins                     Baxter Flattery, PT  Acute Rehab Dept ([place]) [phone] Pager [phone]  12/06/2020    [facility] 12/06/2020, 3:09 PM

## 2020-12-06 NOTE — Progress Notes (Signed)
End of Shift:  No significant events or changes overnight. Patient still on Amiodarone drip. Primofit male external catheter. Alert and Oriented x 4. Intermittently forgetful/ confused.   In bed resting at shift change. Call bell in reach. No signs of distress.

## 2020-12-07 ENCOUNTER — Ambulatory Visit: Payer: Medicare Other | Admitting: Family Medicine

## 2020-12-07 ENCOUNTER — Encounter: Payer: Medicare Other | Admitting: Dietician

## 2020-12-07 ENCOUNTER — Ambulatory Visit
Admission: RE | Admit: 2020-12-07 | Discharge: 2020-12-07 | Disposition: A | Discharge status: Home / Self Care | Payer: Medicare Other | Source: Ambulatory Visit | Attending: Radiation Oncology | Admitting: Radiation Oncology

## 2020-12-07 DIAGNOSIS — R918 Other nonspecific abnormal finding of lung field: Secondary | ICD-10-CM | POA: Insufficient documentation

## 2020-12-07 DIAGNOSIS — C3432 Malignant neoplasm of lower lobe, left bronchus or lung: Secondary | ICD-10-CM | POA: Diagnosis not present

## 2020-12-07 DIAGNOSIS — C3491 Malignant neoplasm of unspecified part of right bronchus or lung: Secondary | ICD-10-CM | POA: Insufficient documentation

## 2020-12-07 DIAGNOSIS — R509 Fever, unspecified: Secondary | ICD-10-CM | POA: Insufficient documentation

## 2020-12-07 LAB — GLUCOSE, CAPILLARY
Glucose-Capillary: 131 mg/dL — ABNORMAL HIGH (ref 70–99)
Glucose-Capillary: 213 mg/dL — ABNORMAL HIGH (ref 70–99)
Glucose-Capillary: 133 mg/dL — ABNORMAL HIGH (ref 70–99)
Glucose-Capillary: 196 mg/dL — ABNORMAL HIGH (ref 70–99)

## 2020-12-07 NOTE — TOC Progression Note (Signed)
Transition of Care [facility]) - Progression Note    Patient Details  Name: Jeremy [name] MRN: [id number] Date of Birth: February 13, 1946  Transition of Care [facility]) CM/SW Contact  Purcell Mouton, RN Phone Number: 12/07/2020, 11:05 AM  Clinical Narrative:     Spoke with pt's wife concerning discharge plans. Mrs. Poplin asked that pt go for rehab and selected 1. [facility], 2. [facility] in [place]. A call was made to [facility] waiting for return call.   Expected Discharge Plan: Skilled Nursing Facility Barriers to Discharge: No Barriers Identified  Expected Discharge Plan and Services Expected Discharge Plan: [place] Choice: [place] Living arrangements for the past 2 months: Single Family Home                                       Social Determinants of Health (SDOH) Interventions    Readmission Risk Interventions Readmission Risk Prevention Plan 11/10/2019 10/20/2019  Transportation Screening Complete Complete  HRI or [place] - Complete  Social Work Consult for [place] Planning/Counseling - Complete  Palliative Care Screening - Not Applicable  Medication Review [employer]) Complete Complete  HRI or Home Care Consult Complete -  SW Recovery Care/Counseling Consult Complete -  Palliative Care Screening Not Applicable -  [place] Not Applicable -  Some recent data might be hidden

## 2020-12-07 NOTE — Progress Notes (Addendum)
Progress Note  Patient Name: Jeremy [name] Date of Encounter: 12/07/2020  [facility] Cardiologist: Carlyle Dolly, MD   Subjective   Feeling well. No chest pain, sob or palpitations.    Inpatient Medications    Scheduled Meds:  docusate sodium  100 mg Oral QHS   feeding supplement  237 mL Oral Q24H   ferrous sulfate  325 mg Oral Q supper   folic acid  1 mg Oral QPM   guaiFENesin  600 mg Oral BID   insulin aspart  0-9 Units Subcutaneous TID WC   metoprolol tartrate  25 mg Oral Q6H   multivitamin with minerals  1 tablet Oral Daily   omega-3 acid ethyl esters  1 g Oral Daily   prednisoLONE acetate  1 drop Left Eye Q12H   sodium chloride flush  3 mL Intravenous Q12H   tamsulosin  0.4 mg Oral Daily   Continuous Infusions:  ceFEPime (MAXIPIME) IV 2 g (12/07/20 0526)   PRN Meds: acetaminophen **OR** acetaminophen, albuterol, bisacodyl, calcium carbonate, cyclobenzaprine, gabapentin, meclizine, oxyCODONE-acetaminophen **AND** oxyCODONE, traMADol   Vital Signs    Vitals:   12/06/20 0812 12/06/20 2125 12/07/20 0050 12/07/20 0521  BP: 112/76 109/74 (!) 100/54 109/71  Pulse:  83 84 84  Resp:  20  18  Temp:  97.7 F (36.5 C)  97.8 F (36.6 C)  TempSrc:  Oral  Oral  SpO2:  95%  97%  Weight:      Height:        Intake/Output Summary (Last 24 hours) at 12/07/2020 0950 Last data filed at 12/07/2020 0530 Gross per 24 hour  Intake --  Output 450 ml  Net -450 ml   Last 3 Weights 11/30/2020 11/10/2020 11/04/2020  Weight (lbs) 183 lb 183 lb 2 oz 185 lb 3.2 oz  Weight (kg) 83.008 kg 83.065 kg 84.006 kg      Telemetry    AFib at 90s- Personally Reviewed  ECG  N/A  Physical Exam   GEN: No acute distress.   Neck: No JVD Cardiac: Irregularly irregular , no murmurs, rubs, or gallops.  Respiratory: Course breath sound  GI: Soft, nontender, non-distended  MS: No edema; No deformity. Neuro:  Nonfocal  Psych: Normal affect   Labs    High Sensitivity Troponin:  No  results for input(s): TROPONINIHS in the last 720 hours.    Chemistry Recent Labs  Lab 11/30/20 1627 12/01/20 0353 12/03/20 0315 12/04/20 0412 12/06/20 0713  NA 140   < > 137 135 136  K 4.9   < > 3.5 3.1* 3.5  CL 101   < > 99 94* 98  CO2 27   < > 27 29 28   GLUCOSE 168*   < > 148* 130* 138*  BUN 28*   < > 25* 29* 37*  CREATININE 1.19   < > 0.94 0.94 1.01  CALCIUM 9.6   < > 9.1 9.0 8.9  PROT 7.0  --   --   --   --   ALBUMIN 2.4*  --   --   --   --   AST 18  --   --   --   --   ALT 19  --   --   --   --   ALKPHOS 72  --   --   --   --   BILITOT 0.4  --   --   --   --   GFRNONAA >60   < > >  60 >60 >60  ANIONGAP 12   < > 11 12 10    < > = values in this interval not displayed.     Hematology Recent Labs  Lab 12/02/20 0359 12/03/20 0315 12/06/20 0713  WBC 18.7* 18.8* 9.4  RBC 2.52* 2.90* 3.10*  HGB 7.7* 8.8* 9.5*  HCT 24.9* 28.8* 30.8*  MCV 98.8 99.3 99.4  MCH 30.6 30.3 30.6  MCHC 30.9 30.6 30.8  RDW 18.0* 17.5* 16.8*  PLT 279 274 241    BNPNo results for input(s): BNP, PROBNP in the last 168 hours.   DDimer No results for input(s): DDIMER in the last 168 hours.   Radiology    No results found.  Cardiac Studies   None this admission   Patient Profile     75 y.o. 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   Assessment & Plan    Atrial fibrillation with RVR - AFib RVR in setting of severe sepsis.  - See yesterday's consult note for detailed recommendations>> plan for rate control strategy and avoid chemical cardioversion as patient can not be anticoagulation due to chronic anemia requiring transfusion - Stopped amiodarone this morning as recommended - HR currently stable at 90s - Continue metoprolol 25 q 6 hours (will plan to consolidate 50mg  BID  prior to discharge). He was taking metoprolol 25mg  BID and Cardizem CD  360mg  at home (stopped).  - BP stable  - CHADSVASCs score of 4   Dr. Acie Fredrickson to see.   For questions or updates, please contact [place] Please consult [email or web address] for contact info under        SignedLeanor Kail, PA  12/07/2020, 9:50 AM     Attending Note:   The patient was seen and examined.  Agree with assessment and plan as noted above.  Changes made to the above note as needed.  Patient seen and independently examined with Robbie Lis, PA .   We discussed all aspects of the encounter. I agree with the assessment and plan as stated above.    Atrial fib:   rate is fairly well controlled at this point.  Will plan on consolidating metoprolol tomorrow ( or prior to DC ) - suggested metoprolol 50 mg po BID.  No additional work up indicated at this time   2.  Sepsis:  further plans per IM team      I have spent a total of 40 minutes with patient reviewing hospital  notes , telemetry, EKGs, labs and examining patient as well as establishing an assessment and plan that was discussed with the patient.  > 50% of time was spent in direct patient care.    Thayer Headings, Brooke Bonito., MD, [facility] 12/07/2020, 1:24 PM [address]  [place] Pager [phone]

## 2020-12-07 NOTE — [place] FL2 (Signed)
MEDICAID FL2 LEVEL OF CARE SCREENING TOOL     IDENTIFICATION  Patient Name: Jeremy [name] Birthdate: 1946-04-15 Sex: male Admission Date (Current Location): 11/30/2020  [facility] and [place] Number:  Herbalist and Address:  [facility],  [place] [place], [place]      Provider Number: [id number]  Attending Physician Name and Address:  Dwyane Dee, MD  Relative Name and Phone Number:  Shaker, [name] Spouse [phone]    Current Level of Care: Hospital Recommended Level of Care: [place] Prior Approval Number:    Date Approved/Denied:   PASRR Number: [id number]  Discharge Plan: SNF    Current Diagnoses: Patient Active Problem List   Diagnosis Date Noted   Severe sepsis with lactic acidosis ([place]) 11/30/2020   Chronic respiratory failure with hypoxia ([place]) 11/30/2020   SIRS (systemic inflammatory response syndrome) ([place]) 10/21/2020   Herniated nucleus pulposus, L3-4 08/30/2020   Recurrent right pleural effusion    Advanced care planning/counseling discussion    Palliative care by specialist    Palliative care encounter    Loculated pleural effusion 11/08/2019   Acquired thrombophilia ([place]) 11/08/2019   Port-A-Cath in place 11/05/2019   Pleural effusion 11/05/2019   Sepsis due to undetermined organism ([place]) 10/19/2019   Thrombocytopenia ([place]) 10/19/2019   Failure to thrive in adult 10/16/2019   Generalized weakness 10/16/2019   Drug-induced skin rash 09/23/2019   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/26/2019   Encounter for antineoplastic chemotherapy 08/26/2019   Encounter for antineoplastic immunotherapy 08/26/2019   Goals of care, counseling/discussion 08/26/2019   AF (paroxysmal atrial fibrillation) ([place])    Protein-calorie malnutrition, severe 08/07/2019   Hemoptysis 08/06/2019   Mass of right lung 08/06/2019   Former smoker 08/06/2019   Lung mass 08/06/2019   Postobstructive  pneumonia 08/06/2019   COPD (chronic obstructive pulmonary disease) (HCC)    HTN (hypertension)    Abnormal weight loss    Leukocytosis    Acute and chronic respiratory failure with hypoxia (HCC)    Anemia in neoplastic disease    Hyperglycemia    Heme + stool 11/06/2018   Type 2 diabetes mellitus ([place]) 2018    Orientation RESPIRATION BLADDER Height & Weight     Self, Time, Situation, Place  O2 (2L O2 [place]) Incontinent Weight: 83 kg Height:  6\' 2"  (188 cm)  BEHAVIORAL SYMPTOMS/MOOD NEUROLOGICAL BOWEL NUTRITION STATUS      Continent Diet (Heart Healthy, Carb Mod)  AMBULATORY STATUS COMMUNICATION OF NEEDS Skin   Extensive Assist Verbally Normal                       Personal Care Assistance Level of Assistance  Bathing, Feeding, Dressing Bathing Assistance: Limited assistance Feeding assistance: Independent Dressing Assistance: Limited assistance     Functional Limitations Info  Sight, Hearing, Speech Sight Info: Adequate Hearing Info: Adequate Speech Info: Adequate    SPECIAL CARE FACTORS FREQUENCY  PT (By licensed PT), OT (By licensed OT)     PT Frequency: 5x week OT Frequency: 5x week            Contractures Contractures Info: Not present    Additional Factors Info  Code Status, Allergies Code Status Info: DNR Allergies Info: Naproxen Sodium, Pembrolizumab, Chlorhexidine, Nsaids           Current Medications (12/07/2020):  This is the current hospital active medication list Current Facility-Administered Medications  Medication Dose Route Frequency Provider  Last Rate Last Admin   acetaminophen (TYLENOL) tablet 650 mg  650 mg Oral Q6H PRN Lenore Cordia, MD   650 mg at 12/04/20 0410   Or   acetaminophen (TYLENOL) suppository 650 mg  650 mg Rectal Q6H PRN Lenore Cordia, MD       albuterol (PROVENTIL) (2.5 MG/3ML) 0.083% nebulizer solution 2.5 mg  2.5 mg Nebulization Q2H PRN Lenore Cordia, MD   2.5 mg at 12/02/20 0021   bisacodyl (DULCOLAX) EC  tablet 10 mg  10 mg Oral Daily PRN Swayze, Ava, DO       calcium carbonate (TUMS - dosed in mg elemental calcium) chewable tablet 400 mg of elemental calcium  400 mg of elemental calcium Oral TID PRN Swayze, Ava, DO   400 mg of elemental calcium at 12/05/20 1533   ceFEPIme (MAXIPIME) 2 g in sodium chloride 0.9 % 100 mL IVPB  2 g Intravenous Q8H Lenis Noon, RPH 200 mL/hr at 12/07/20 0526 2 g at 12/07/20 0526   cyclobenzaprine (FLEXERIL) tablet 10 mg  10 mg Oral QHS PRN Swayze, Ava, DO       docusate sodium (COLACE) capsule 100 mg  100 mg Oral QHS Swayze, Ava, DO   100 mg at 12/06/20 2118   feeding supplement (ENSURE ENLIVE / ENSURE PLUS) liquid 237 mL  237 mL Oral Q24H Darliss Cheney, MD   237 mL at 12/05/20 1410   ferrous sulfate tablet 325 mg  325 mg Oral Q supper Swayze, Ava, DO   325 mg at 27/25/36 6440   folic acid (FOLVITE) tablet 1 mg  1 mg Oral QPM Swayze, Ava, DO   1 mg at 12/06/20 1743   gabapentin (NEURONTIN) capsule 600 mg  600 mg Oral QHS PRN Swayze, Ava, DO       guaiFENesin (MUCINEX) 12 hr tablet 600 mg  600 mg Oral BID Zada Finders R, MD   600 mg at 12/07/20 0844   insulin aspart (novoLOG) injection 0-9 Units  0-9 Units Subcutaneous TID WC Lenore Cordia, MD   1 Units at 12/07/20 0842   meclizine (ANTIVERT) tablet 25 mg  25 mg Oral TID PRN Swayze, Ava, DO       metoprolol tartrate (LOPRESSOR) tablet 25 mg  25 mg Oral Q6H Buford Dresser, MD   25 mg at 12/07/20 3474   multivitamin with minerals tablet 1 tablet  1 tablet Oral Daily Darliss Cheney, MD   1 tablet at 12/07/20 0844   omega-3 acid ethyl esters (LOVAZA) capsule 1 g  1 g Oral Daily Swayze, Ava, DO   1 g at 12/07/20 0844   oxyCODONE-acetaminophen (PERCOCET/ROXICET) 5-325 MG per tablet 1 tablet  1 tablet Oral Q6H PRN Swayze, Ava, DO   1 tablet at 12/06/20 2118   And   oxyCODONE (Oxy IR/ROXICODONE) immediate release tablet 5 mg  5 mg Oral Q6H PRN Swayze, Ava, DO   5 mg at 12/04/20 1500   prednisoLONE acetate (PRED  FORTE) 1 % ophthalmic suspension 1 drop  1 drop Left Eye Q12H Swayze, Ava, DO   1 drop at 12/07/20 0855   sodium chloride flush (NS) 0.9 % injection 3 mL  3 mL Intravenous Q12H Zada Finders R, MD   3 mL at 12/06/20 2136   tamsulosin (FLOMAX) capsule 0.4 mg  0.4 mg Oral Daily Swayze, Ava, DO   0.4 mg at 12/07/20 0844   traMADol (ULTRAM) tablet 50 mg  50 mg Oral Q4H PRN Swayze, Ava,  DO       Facility-Administered Medications Ordered in Other Encounters  Medication Dose Route Frequency Provider Last Rate Last Admin   0.9 %  sodium chloride infusion   Intravenous Continuous Eppie Gibson, MD         Discharge Medications: Please see discharge summary for a list of discharge medications.  Relevant Imaging Results:  Relevant Lab Results:   Additional Information SS#[id number]  Purcell Mouton, RN

## 2020-12-07 NOTE — Progress Notes (Signed)
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  Patient reported productive cough, fever, diaphoresis, chills and poor p.o. intake.  He was thought to be septic related to pneumonia.  Interval History:  No events overnight.  Spoke with his wife on the phone while in the room this morning.  Tentative plan is for SNF placement at discharge.  ROS: Constitutional: negative for chills and fevers, Respiratory: negative for cough, Cardiovascular: negative for chest pain, and Gastrointestinal: negative for abdominal pain  Assessment & Plan: Severe sepsis secondary to healthcare associated pneumonia, POA: Patient presented with fever, tachycardia, tachypnea, leukocytosis, and lactic acidosis likely secondary to healthcare associated pneumonia.  Patient meets healthcare associated pneumonia criteria based off of history of cancer getting regular radiation. COVID and influenza negative.  Blood cultures negative to date Sputum culture showed normal respiratory flora -Plan to finish 8-day course total of cefepime; clinically he has responded   Acute on chronic anemia/rectal bleeding:  Thought to be hemorrhoidal Initial hemoglobin 6.6 Transfused 2 units of packed red blood cells Hemoglobin stable   Chronic respiratory failure with hypoxia:  On 2 L nasal cannula nightly At baseline   Stage III/IV squamous cell  carcinoma of the right lung with potential malignant right pleural effusion and metastatic versus new primary stage IIIa squamous cell carcinoma of the left lower lung: Follows with oncology, [name] Radiation oncology, Dr. Sondra Come.   Recently started on palliative radiotherapy.  Missed 3 doses.  Informed Dr. Julien Nordmann about his admission. - per wife, he's currently on 4/33 planned radiation treatments    Paroxysmal atrial fibrillation: Not a candidate for anticoagulation due to transfusion dependent anemia - rate coming under control more today it appears - lopressor adjusted per cardiology  - dilt only if necessary; amio has been stopped   Type 2 diabetes: Continue to hold metformin.   Continue SSI.   Hypertension: On Toprol and diltiazem at home Holding diltiazem due to soft blood pressures   Psoriatic arthritis: Hold methotrexate.   Hypomagnesemia/hypokalemia:  Replete and trend  Old records reviewed in assessment of this patient  Antimicrobials: Cefepime   DVT prophylaxis: SCDs Start: 11/30/20 1950   Code Status:   Code Status: DNR Family Communication:   Disposition Plan: Status is: Inpatient  Remains inpatient appropriate because:Unsafe d/c plan and Inpatient level of care appropriate due to severity of illness  Dispo: The patient is from: Home              Anticipated d/c is to: SNF              Patient currently is not medically stable to d/c.   Difficult to place patient Yes  Risk of unplanned readmission score: Unplanned Admission- Pilot do not use: 23.62   Objective: Blood pressure 119/73, pulse 100, temperature 98 F (36.7 C), temperature source Oral, resp. rate 18, height 6\' 2"  (1.88 m), weight 83 kg, SpO2 98 %.  Examination: General appearance: alert, cooperative, and no distress Head: Normocephalic, without obvious abnormality, atraumatic Eyes:  EOMI Lungs:  Coarse breath sounds on the right noted.  No significant wheezing Heart: regular rate  and rhythm and S1, S2 normal Abdomen: normal findings: bowel sounds normal and soft, non-tender Extremities:  No edema Skin: mobility and turgor normal Neurologic: Grossly normal  Consultants:  Cardiology  Procedures:    Data Reviewed: I have personally reviewed following labs and imaging studies Results for orders placed or performed during the hospital encounter of 11/30/20 (from the past 24 hour(s))  Glucose, capillary     Status: Abnormal   Collection Time: 12/06/20  9:23 PM  Result Value Ref Range   Glucose-Capillary 141 (H) 70 - 99 mg/dL  Glucose, capillary     Status: Abnormal   Collection Time: 12/07/20  7:42 AM  Result Value Ref Range   Glucose-Capillary 131 (H) 70 - 99 mg/dL  Glucose, capillary     Status: Abnormal   Collection Time: 12/07/20 11:55 AM  Result Value Ref Range   Glucose-Capillary 213 (H) 70 - 99 mg/dL  Glucose, capillary     Status: Abnormal   Collection Time: 12/07/20  4:41 PM  Result Value Ref Range   Glucose-Capillary 133 (H) 70 - 99 mg/dL    Recent Results (from the past 240 hour(s))  Urine culture     Status: Abnormal   Collection Time: 11/30/20  4:27 PM   Specimen: In/Out Cath Urine  Result Value Ref Range Status   Specimen Description   Final    IN/OUT CATH URINE Performed at [facility], [place] [address], [place], [place] [address]    Special Requests   Final    NONE Performed at [facility], [place] [address] [place], [place] [address]    Culture 50,000 COLONIES/mL PROTEUS MIRABILIS (A)  Final   Report Status 12/03/2020 FINAL  Final   Organism ID, Bacteria PROTEUS MIRABILIS (A)  Final      Susceptibility   Proteus mirabilis - MIC*    AMPICILLIN <=2 SENSITIVE Sensitive     CEFAZOLIN <=4 SENSITIVE Sensitive     CEFEPIME <=0.12 SENSITIVE Sensitive     CEFTRIAXONE <=0.25 SENSITIVE Sensitive     CIPROFLOXACIN <=0.25 SENSITIVE Sensitive     GENTAMICIN <=1 SENSITIVE Sensitive     IMIPENEM 2 SENSITIVE  Sensitive     NITROFURANTOIN RESISTANT Resistant     TRIMETH/SULFA <=20 SENSITIVE Sensitive     AMPICILLIN/SULBACTAM <=2 SENSITIVE Sensitive     PIP/TAZO <=4 SENSITIVE Sensitive     * 50,000 COLONIES/mL PROTEUS MIRABILIS  Blood Culture (routine x 2)     Status: None   Collection Time: 11/30/20  4:27 PM   Specimen: BLOOD  Result Value Ref Range Status   Specimen Description   Final    BLOOD LEFT ANTECUBITAL Performed at [place] [address], [place], [place] [address]    Special Requests   Final    BOTTLES DRAWN AEROBIC AND ANAEROBIC Blood Culture adequate volume Performed at [place] [address], [place], [place] [address]    Culture   Final    NO GROWTH 5 DAYS Performed at [facility], [place] [address] [place],  [address]    Report Status 12/05/2020 FINAL  Final  Blood Culture (routine x 2)     Status: None   Collection Time: 11/30/20  4:27 PM   Specimen: BLOOD  Result Value  Ref Range Status   Specimen Description   Final    BLOOD RIGHT ANTECUBITAL Performed at [place] [address], [place], [place] [address]    Special Requests   Final    BOTTLES DRAWN AEROBIC AND ANAEROBIC Blood Culture adequate volume Performed at [place] [address] [place], [place] [address]    Culture   Final    NO GROWTH 5 DAYS Performed at [facility], [place] [address] [place], [place] [address]    Report Status 12/05/2020 FINAL  Final  Resp Panel by RT-PCR (Flu A&B, Covid)     Status: None   Collection Time: 11/30/20  4:27 PM   Specimen: Nasopharyngeal(NP) swabs in vial transport medium  Result Value Ref Range Status   SARS Coronavirus 2 by RT PCR NEGATIVE NEGATIVE Final    Comment: (NOTE) SARS-CoV-2 target nucleic acids are NOT DETECTED.  The SARS-CoV-2 RNA is generally detectable in upper respiratory specimens during the acute phase of infection. The  lowest concentration of SARS-CoV-2 viral copies this assay can detect is 138 copies/mL. A negative result does not preclude SARS-Cov-2 infection and should not be used as the sole basis for treatment or other patient management decisions. A negative result may occur with  improper specimen collection/handling, submission of specimen other than nasopharyngeal swab, presence of viral mutation(s) within the areas targeted by this assay, and inadequate number of viral copies(<138 copies/mL). A negative result must be combined with clinical observations, patient history, and epidemiological information. The expected result is Negative.  Fact Sheet for Patients:  EntrepreneurPulse.com.au  Fact Sheet for Healthcare Providers:  IncredibleEmployment.be  This test is no t yet approved or cleared by the [place] FDA and  has been authorized for detection and/or diagnosis of SARS-CoV-2 by FDA under an Emergency Use Authorization (EUA). This EUA will remain  in effect (meaning this test can be used) for the duration of the COVID-19 declaration under Section 564(b)(1) of the Act, 21 U.S.C.section 360bbb-3(b)(1), unless the authorization is terminated  or revoked sooner.       Influenza A by PCR NEGATIVE NEGATIVE Final   Influenza B by PCR NEGATIVE NEGATIVE Final    Comment: (NOTE) The Xpert Xpress SARS-CoV-2/FLU/RSV plus assay is intended as an aid in the diagnosis of influenza from Nasopharyngeal swab specimens and should not be used as a sole basis for treatment. Nasal washings and aspirates are unacceptable for Xpert Xpress SARS-CoV-2/FLU/RSV testing.  Fact Sheet for Patients: EntrepreneurPulse.com.au  Fact Sheet for Healthcare Providers: IncredibleEmployment.be  This test is not yet approved or cleared by the [place] FDA and has been authorized for detection and/or diagnosis of SARS-CoV-2 by FDA under  an Emergency Use Authorization (EUA). This EUA will remain in effect (meaning this test can be used) for the duration of the COVID-19 declaration under Section 564(b)(1) of the Act, 21 U.S.C. section 360bbb-3(b)(1), unless the authorization is terminated or revoked.  Performed at [facility], [place] [address], [place], [place] [address]   MRSA PCR Screening     Status: None   Collection Time: 12/01/20  8:00 PM  Result Value Ref Range Status   MRSA by PCR NEGATIVE NEGATIVE Final    Comment:        The GeneXpert MRSA Assay (FDA approved for NASAL specimens only), is one component of a comprehensive MRSA colonization surveillance program. It is not intended to diagnose MRSA infection nor to guide or monitor treatment for MRSA  infections. Performed at [facility], [place] [address], [place], [place] [address]   Expectorated Sputum Assessment w Gram Stain, Rflx to Resp Cult     Status: None   Collection Time: 12/02/20  6:30 AM   Specimen: Expectorated Sputum  Result Value Ref Range Status   Specimen Description EXPECTORATED SPUTUM  Final   Special Requests NONE  Final   Sputum evaluation   Final    Sputum specimen not acceptable for testing.  Please recollect.   INFORM COMMING K. RN  ON 12/02/2020 @ 0706 BY MECIAL J. Performed at [facility], [place] [address], [place], [place] [address]    Report Status 12/02/2020 FINAL  Final  Expectorated Sputum Assessment w Gram Stain, Rflx to Resp Cult     Status: None   Collection Time: 12/02/20 12:07 PM  Result Value Ref Range Status   Specimen Description EXPECTORATED SPUTUM  Final   Special Requests NONE  Final   Sputum evaluation   Final    THIS SPECIMEN IS ACCEPTABLE FOR SPUTUM CULTURE Performed at [facility], [place] [address] [place], [place] [address]    Report Status 12/02/2020 FINAL  Final  Culture, Respiratory w Gram Stain     Status: None   Collection  Time: 12/02/20 12:07 PM  Result Value Ref Range Status   Specimen Description   Final    EXPECTORATED SPUTUM Performed at [facility], [place] [address], [place], [place] [address]    Special Requests   Final    NONE Reflexed from [id number] Performed at [facility], [place] [address] [place], [place] [address]    Gram Stain   Final    ABUNDANT WBC PRESENT,BOTH PMN AND MONONUCLEAR MODERATE GRAM NEGATIVE RODS MODERATE GRAM POSITIVE COCCI FEW GRAM VARIABLE ROD    Culture   Final    RARE Normal respiratory flora-no Staph aureus or Pseudomonas seen Performed at [facility], [address] [place], [place] [address]    Report Status 12/04/2020 FINAL  Final     Radiology Studies: No results found. CT Angio Chest Pulmonary Embolism (PE) W or WO Contrast  Final Result    DG Chest Port 1 View  Final Result      Scheduled Meds:  docusate sodium  100 mg Oral QHS   feeding supplement  237 mL Oral Q24H   ferrous sulfate  325 mg Oral Q supper   folic acid  1 mg Oral QPM   guaiFENesin  600 mg Oral BID   insulin aspart  0-9 Units Subcutaneous TID WC   metoprolol tartrate  25 mg Oral Q6H   multivitamin with minerals  1 tablet Oral Daily   omega-3 acid ethyl esters  1 g Oral Daily   prednisoLONE acetate  1 drop Left Eye Q12H   sodium chloride flush  3 mL Intravenous Q12H   tamsulosin  0.4 mg Oral Daily   PRN Meds: acetaminophen **OR** acetaminophen, albuterol, bisacodyl, calcium carbonate, cyclobenzaprine, gabapentin, meclizine, oxyCODONE-acetaminophen **AND** oxyCODONE, traMADol Continuous Infusions:  ceFEPime (MAXIPIME) IV 2 g (12/07/20 1552)     LOS: 7 days  Time spent: Greater than 50% of the 35 minute visit was spent in counseling/coordination of care for the patient as laid out in the A&P.   Dwyane Dee, MD [facility] 12/07/2020, 5:05 PM

## 2020-12-08 ENCOUNTER — Ambulatory Visit
Admission: RE | Admit: 2020-12-08 | Discharge: 2020-12-08 | Disposition: A | Discharge status: Home / Self Care | Payer: Medicare Other | Source: Ambulatory Visit | Attending: Radiation Oncology | Admitting: Radiation Oncology

## 2020-12-08 DIAGNOSIS — R2689 Other abnormalities of gait and mobility: Secondary | ICD-10-CM | POA: Diagnosis not present

## 2020-12-08 DIAGNOSIS — J9611 Chronic respiratory failure with hypoxia: Secondary | ICD-10-CM | POA: Diagnosis not present

## 2020-12-08 DIAGNOSIS — C3491 Malignant neoplasm of unspecified part of right bronchus or lung: Secondary | ICD-10-CM | POA: Diagnosis not present

## 2020-12-08 DIAGNOSIS — A419 Sepsis, unspecified organism: Secondary | ICD-10-CM | POA: Diagnosis not present

## 2020-12-08 DIAGNOSIS — R531 Weakness: Secondary | ICD-10-CM | POA: Diagnosis not present

## 2020-12-08 DIAGNOSIS — G4700 Insomnia, unspecified: Secondary | ICD-10-CM | POA: Diagnosis not present

## 2020-12-08 DIAGNOSIS — L0390 Cellulitis, unspecified: Secondary | ICD-10-CM | POA: Diagnosis not present

## 2020-12-08 DIAGNOSIS — Z743 Need for continuous supervision: Secondary | ICD-10-CM | POA: Diagnosis not present

## 2020-12-08 DIAGNOSIS — M6281 Muscle weakness (generalized): Secondary | ICD-10-CM | POA: Diagnosis not present

## 2020-12-08 DIAGNOSIS — E1140 Type 2 diabetes mellitus with diabetic neuropathy, unspecified: Secondary | ICD-10-CM | POA: Diagnosis not present

## 2020-12-08 DIAGNOSIS — I480 Paroxysmal atrial fibrillation: Secondary | ICD-10-CM

## 2020-12-08 DIAGNOSIS — R6520 Severe sepsis without septic shock: Secondary | ICD-10-CM | POA: Diagnosis not present

## 2020-12-08 DIAGNOSIS — E872 Acidosis: Secondary | ICD-10-CM | POA: Diagnosis not present

## 2020-12-08 DIAGNOSIS — R1310 Dysphagia, unspecified: Secondary | ICD-10-CM | POA: Diagnosis not present

## 2020-12-08 DIAGNOSIS — R41841 Cognitive communication deficit: Secondary | ICD-10-CM | POA: Diagnosis not present

## 2020-12-08 DIAGNOSIS — C3432 Malignant neoplasm of lower lobe, left bronchus or lung: Secondary | ICD-10-CM | POA: Diagnosis not present

## 2020-12-08 DIAGNOSIS — L03113 Cellulitis of right upper limb: Secondary | ICD-10-CM | POA: Diagnosis not present

## 2020-12-08 DIAGNOSIS — N390 Urinary tract infection, site not specified: Secondary | ICD-10-CM | POA: Diagnosis not present

## 2020-12-08 LAB — GLUCOSE, CAPILLARY
Glucose-Capillary: 137 mg/dL — ABNORMAL HIGH (ref 70–99)
Glucose-Capillary: 132 mg/dL — ABNORMAL HIGH (ref 70–99)
Glucose-Capillary: 187 mg/dL — ABNORMAL HIGH (ref 70–99)

## 2020-12-08 LAB — RESP PANEL BY RT-PCR (FLU A&B, COVID) ARPGX2
Influenza A by PCR: NEGATIVE
Influenza B by PCR: NEGATIVE
SARS Coronavirus 2 by RT PCR: NEGATIVE

## 2020-12-08 MED ORDER — METOPROLOL TARTRATE 50 MG PO TABS
50.0000 mg | ORAL_TABLET | Freq: Two times a day (BID) | ORAL | Status: DC
  Administered 2020-12-08: 50 mg via ORAL
  Filled 2020-12-08: qty 1

## 2020-12-08 MED ORDER — METOPROLOL TARTRATE 50 MG PO TABS: 50.0000 mg | ORAL_TABLET | Freq: Two times a day (BID) | ORAL | Status: DC

## 2020-12-08 NOTE — Progress Notes (Signed)
PT Cancellation Note  Patient Details Name: Jeremy [name] MRN: [id number] DOB: [birth year]   Cancelled Treatment:     pt out of room at Radiation this afternoon.  Pt has been evaluated with rec for SNF.   Nathanial Rancher 12/08/2020, 2:48 PM

## 2020-12-08 NOTE — TOC Progression Note (Signed)
Transition of Care [facility]) - Progression Note    Patient Details  Name: Jeremy [name] MRN: [id number] Date of Birth: 1946/04/30  Transition of Care [facility] CM/SW Contact  Purcell Mouton, RN Phone Number: 12/08/2020, 1:13 PM  Clinical Narrative:    Spoke with pt's wife concerning discharge plan to [facility]. [name] agreed with [facility] and asked that pt have one more treatment of radiation today before leaving. Waiting for COVID test. Discharge summary sent.    Expected Discharge Plan: Skilled Nursing Facility Barriers to Discharge: No Barriers Identified  Expected Discharge Plan and Services Expected Discharge Plan: [place] Choice: [place] Living arrangements for the past 2 months: Single Family Home Expected Discharge Date: 12/08/20                                     Social Determinants of Health (SDOH) Interventions    Readmission Risk Interventions Readmission Risk Prevention Plan 11/10/2019 10/20/2019  Transportation Screening Complete Complete  HRI or [place] - Complete  Social Work Consult for [place] Planning/Counseling - Complete  Palliative Care Screening - Not Applicable  Medication Review Press photographer) Complete Complete  HRI or Home Care Consult Complete -  SW Recovery Care/Counseling Consult Complete -  Palliative Care Screening Not Applicable -  [place] Not Applicable -  Some recent data might be hidden

## 2020-12-08 NOTE — Progress Notes (Signed)
Occupational Therapy Treatment Patient Details Name: Jeremy [name] MRN: [id number] DOB: 1946-04-14 Today's Date: 12/08/2020    History of present illness Patient admitted for sepsis. Jeremy [name] is a 75 y.o. male with medical history significant for stage III/IV squamous cell carcinoma with recurrent right pleural effusion and metastatic versus new primary left lower lung squamous cell carcinoma currently on palliative radiotherapy, COPD, chronic respiratory failure with hypoxia on 2 L O2 via [place] qhs, PAF not on AC due to transfusion dependent anemia, T2DM, HTN, psoriatic arthritis who presented to the ED for evaluation of fevers, generalized weakness.     Patient recently started palliative radiotherapy for his known lung cancer. He has not been able to attend his last 3 radiation treatments. He reports occasional lightheadedness.   OT comments  Treatment focused on activity tolerance. Patient min assist to transfer to edge of bed and min guard with RW to ambulate. Today patient able to tolerate approx 70 feet of ambulation and heart rate maintained between 93-104. Can now begin to work on higher level ADL tasks. Cont POC.    Follow Up Recommendations  Home health OT    Equipment Recommendations  None recommended by OT    Recommendations for Other Services      Precautions / Restrictions Precautions Precautions: Fall Precaution Comments: hx of falls, monitor HR Restrictions Weight Bearing Restrictions: No       Mobility Bed Mobility Overal bed mobility: Needs Assistance Bed Mobility: Supine to Sit           General bed mobility comments: Min assist for hand hold to pull himself into sttting with increased time. A little bit more effortful today compared to prior treatments.    Transfers Overall transfer level: Needs assistance Equipment used: Rolling walker (2 wheeled)   Sit to Stand: Min guard         General transfer comment: Min guard to stand from bed and  ambulate with RW. Patient ambulated approx 70 feet on RA. HR between 93-104. O2 sat 99% (though took several minutes to get a reading)    Balance   Sitting-balance support: No upper extremity supported;Feet supported Sitting balance-Leahy Scale: Good     Standing balance support: During functional activity Standing balance-Leahy Scale: Fair                             ADL either performed or assessed with clinical judgement   ADL                                               Vision Patient Visual Report: No change from baseline     Perception     Praxis      Cognition Arousal/Alertness: Awake/alert Behavior During Therapy: WFL for tasks assessed/performed Overall Cognitive Status: Within Functional Limits for tasks assessed                                          Exercises     Shoulder Instructions       General Comments      Pertinent Vitals/ Pain       Pain Assessment: No/denies pain  Home Living  Prior Functioning/Environment              Frequency  Min 2X/week        Progress Toward Goals  OT Goals(current goals can now be found in the care plan section)  Progress towards OT goals: Progressing toward goals  Acute Rehab OT Goals Patient Stated Goal: to ambulate to bathroom OT Goal Formulation: With patient Time For Goal Achievement: 12/16/20 Potential to Achieve Goals: Good  Plan Discharge plan remains appropriate    Co-evaluation                 AM-PAC OT "6 Clicks" Daily Activity     Outcome Measure   Help from another person eating meals?: None Help from another person taking care of personal grooming?: A Little Help from another person toileting, which includes using toliet, bedpan, or urinal?: A Little Help from another person bathing (including washing, rinsing, drying)?: A Little Help from another person to put on  and taking off regular upper body clothing?: A Lot Help from another person to put on and taking off regular lower body clothing?: A Lot 6 Click Score: 17    End of Session Equipment Utilized During Treatment: Rolling walker;Gait belt  OT Visit Diagnosis: Unsteadiness on feet (R26.81);Muscle weakness (generalized) (M62.81)   Activity Tolerance Patient tolerated treatment well   Patient Left in chair;with call bell/phone within reach;with family/visitor present;with chair alarm set   Nurse Communication Mobility status (HR with mobility)        Time: 0802-2336 OT Time Calculation (min): 15 min  Charges: OT General Charges $OT Visit: 1 Visit OT Treatments $Therapeutic Activity: 8-22 mins  Jeremy [name], OTR/L [place]  Office [phone] Pager: [place] 12/08/2020, 12:20 PM

## 2020-12-08 NOTE — Discharge Summary (Signed)
Physician Discharge Summary   PHENG PROKOP TMA:[id number] DOB: 01/27/1946 DOA: 11/30/2020  PCP: [name], MD  Admit date: 11/30/2020 Discharge date:  12/08/2020  Admitted From: home Disposition:  SNF Discharging physician: Dwyane Dee, MD  Recommendations for Outpatient Follow-up:  Follow up with cardiology, 02/02/21 If HR begins to elevate and BP supports, then diltiazem can be restarted at low dose and titrate up if needed. If hypotensive then may require re-initiation of amiodarone but wife will wish to discuss with cardiology further beforehand  Patient discharged to SNF in Discharge Condition: stable Risk of unplanned readmission score: Unplanned Admission- Pilot do not use: 23.66  CODE STATUS: DNR Diet recommendation:  Diet Orders (From admission, onward)     Start     Ordered   12/08/20 0000  Diet - low sodium heart healthy        12/08/20 1228   11/30/20 1950  Diet heart healthy/carb modified Room service appropriate? Yes; Fluid consistency: Thin  Diet effective now       Question Answer Comment  Diet-HS Snack? Nothing   Room service appropriate? 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  Patient reported productive cough, fever, diaphoresis, chills and poor p.o. intake.  He was thought to be septic related to pneumonia.  Severe sepsis secondary to healthcare associated pneumonia, POA: Patient presented with fever, tachycardia, tachypnea,  leukocytosis, and lactic acidosis likely secondary to healthcare associated pneumonia.  Patient meets healthcare associated pneumonia criteria based off of history of cancer getting regular radiation. COVID and influenza negative.  Blood cultures negative to date Sputum culture showed normal respiratory flora -Plan to finish 8-day course total of cefepime; clinically he has responded.  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 Radiation oncology, Dr. Sondra Come.   Recently started on palliative radiotherapy.  Missed 3 doses.  Informed Dr. Julien Nordmann about his admission. - per wife, he's currently on 5/33 planned radiation treatments  (last radiation on 7/5) and planned to be on hold while patient completes rehab    Paroxysmal atrial fibrillation: Not a candidate for anticoagulation due to transfusion dependent anemia - rate better controlled - continue Lopressor 50 mg daily per cardiology at discharge - Diltiazem only to be initiated if RVR and blood pressure allows.  Otherwise may still require reinitiation of amiodarone   Type 2 diabetes: Continue to hold metformin.   Continue SSI.   Hypertension: - only on lopressor at discharge  Holding diltiazem due to soft blood pressures   Psoriatic arthritis: resume methotrexate  Hypomagnesemia/hypokalemia:  Repleted   Principal Diagnosis: Severe sepsis with lactic acidosis [facility])  Discharge Diagnoses: Active Hospital Problems   Diagnosis Date Noted   Severe sepsis with lactic  acidosis ([place]) 11/30/2020   Chronic respiratory failure with hypoxia ([facility]) 11/30/2020   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[place]) 08/26/2019   AF (paroxysmal atrial fibrillation) ([facility])    HTN (hypertension)    Type 2 diabetes mellitus ([place]) 2018    Resolved Hospital Problems  No resolved problems to display.    Discharge Instructions     Diet - low sodium heart healthy   Complete by: As directed    Increase activity slowly   Complete by: As directed       Allergies as of 12/08/2020       Reactions   Naproxen Sodium Hives, Swelling, Rash, Other (See Comments)   "Made the face, lips, and mouth swell- had to go to the E.R."   Pembrolizumab Rash, Other (See Comments)   KEYTRUDA   Chlorhexidine    No central line   Nsaids Hives, Swelling, Rash        Medication List     STOP taking these medications    azithromycin 250 MG tablet Commonly known as: Zithromax   diltiazem 360 MG 24 hr capsule Commonly known as: CARDIZEM CD       TAKE these medications    acetaminophen 325 MG tablet Commonly known as: TYLENOL Take 2 tablets (650 mg total) by mouth every 6 (six) hours as needed for mild pain, fever or headache. What changed:  how much to take when to take this   albuterol 108 (90 Base) MCG/ACT inhaler Commonly known as: VENTOLIN HFA Inhale 2 puffs into the lungs every 6 (six) hours as needed for wheezing or shortness of breath.   bisacodyl 5 MG EC tablet Commonly known as: DULCOLAX Take 10 mg by mouth daily as needed for moderate constipation.   CENTRUM ADULTS PO Take 1 tablet by mouth daily at 6 (six) AM.   cyclobenzaprine 10 MG tablet Commonly known as: FLEXERIL Take 10 mg by mouth at bedtime as needed for muscle spasms.   docusate sodium 100 MG capsule Commonly known as: COLACE Take 100 mg by mouth at bedtime.   ferrous sulfate 325 (65 FE) MG tablet Take 325 mg by mouth daily with supper.   Fish Oil 1000 MG Caps Take 1,000 mg by mouth  daily.   folic acid 1 MG tablet Commonly known as: FOLVITE Take 1 mg by mouth every evening.   gabapentin 300 MG capsule Commonly known as: NEURONTIN Take 600 mg by mouth at bedtime as needed (for neuropathy).   meclizine 25 MG tablet Commonly known as: ANTIVERT Take 25 mg by mouth 3 (three) times daily as needed for dizziness.   metFORMIN 500 MG 24 hr tablet Commonly known as: GLUCOPHAGE-XR Take 500-1,000 mg by mouth See admin instructions. Take 1,000 mg by mouth in the morning and 500 mg at bedtime   methotrexate 2.5 MG tablet Commonly known as: RHEUMATREX Take 10 mg by mouth every Sunday. Caution:Chemotherapy. Protect from light.   metoprolol tartrate 50 MG tablet Commonly known as: LOPRESSOR Take 1 tablet (50 mg total) by mouth 2 (two) times daily. What changed:  medication strength how much to take   oxyCODONE-acetaminophen 10-325 MG tablet Commonly known as: PERCOCET Take 1 tablet by mouth every 6 (six) hours as needed for pain.   prednisoLONE acetate 1 % ophthalmic suspension Commonly known  as: PRED FORTE Place 1 drop into the left eye in the morning and at bedtime.   tamsulosin 0.4 MG Caps capsule Commonly known as: FLOMAX Take 0.4 mg by mouth daily.   traMADol 50 MG tablet Commonly known as: ULTRAM Take 50 mg by mouth every 4 (four) hours as needed for moderate pain.        Follow-up Information     Arnoldo Lenis, MD Follow up on 02/02/2021.   Specialty: Cardiology Why: @9 :20am Contact information: [place] [address] [phone]                Allergies  Allergen Reactions   Naproxen Sodium Hives, Swelling, Rash and Other (See Comments)    "Made the face, lips, and mouth swell- had to go to the E.R."   Pembrolizumab Rash and Other (See Comments)    KEYTRUDA   Chlorhexidine     No central line   Nsaids Hives, Swelling and Rash    Consultations: Cardiology  Discharge Exam: BP 122/78   Pulse 93    Temp 97.9 F (36.6 C) (Oral)   Resp 18   Ht 6\' 2"  (1.88 m)   Wt 83 kg   SpO2 100%   BMI 23.50 kg/m  General appearance: alert, cooperative, and no distress Head: Normocephalic, without obvious abnormality, atraumatic Eyes:  EOMI Lungs:  Coarse breath sounds on the right noted.  No significant wheezing Heart: regular rate and rhythm and S1, S2 normal Abdomen: normal findings: bowel sounds normal and soft, non-tender Extremities:  No edema Skin: mobility and turgor normal Neurologic: Grossly normal  The results of significant diagnostics from this hospitalization (including imaging, microbiology, ancillary and laboratory) are listed below for reference.   Microbiology: Recent Results (from the past 240 hour(s))  Urine culture     Status: Abnormal   Collection Time: 11/30/20  4:27 PM   Specimen: In/Out Cath Urine  Result Value Ref Range Status   Specimen Description   Final    IN/OUT CATH URINE Performed at [facility] [address] [place], [place] [address]    Special Requests   Final    NONE Performed at [facility], [place] [address] [place], [place] [address]    Culture 50,000 COLONIES/mL PROTEUS MIRABILIS (A)  Final   Report Status 12/03/2020 FINAL  Final   Organism ID, Bacteria PROTEUS MIRABILIS (A)  Final      Susceptibility   Proteus mirabilis - MIC*    AMPICILLIN <=2 SENSITIVE Sensitive     CEFAZOLIN <=4 SENSITIVE Sensitive     CEFEPIME <=0.12 SENSITIVE Sensitive     CEFTRIAXONE <=0.25 SENSITIVE Sensitive     CIPROFLOXACIN <=0.25 SENSITIVE Sensitive     GENTAMICIN <=1 SENSITIVE Sensitive     IMIPENEM 2 SENSITIVE Sensitive     NITROFURANTOIN RESISTANT Resistant     TRIMETH/SULFA <=20 SENSITIVE Sensitive     AMPICILLIN/SULBACTAM <=2 SENSITIVE Sensitive     PIP/TAZO <=4 SENSITIVE Sensitive     * 50,000 COLONIES/mL PROTEUS MIRABILIS  Blood Culture (routine x 2)     Status: None   Collection Time: 11/30/20  4:27 PM    Specimen: BLOOD  Result Value Ref Range Status   Specimen Description   Final    BLOOD LEFT ANTECUBITAL Performed at [facility] [address] [place],  [address]    Special Requests   Final    BOTTLES DRAWN AEROBIC AND ANAEROBIC Blood Culture  adequate volume Performed at [facility] [address] [place], [place] [address]    Culture   Final    NO GROWTH 5 DAYS Performed at [facility], [place] [address] [place], [place] [address]    Report Status 12/05/2020 FINAL  Final  Blood Culture (routine x 2)     Status: None   Collection Time: 11/30/20  4:27 PM   Specimen: BLOOD  Result Value Ref Range Status   Specimen Description   Final    BLOOD RIGHT ANTECUBITAL Performed at [facility] [address] [place], [place] [address]    Special Requests   Final    BOTTLES DRAWN AEROBIC AND ANAEROBIC Blood Culture adequate volume Performed at [facility] [address] [place], [place] [address]    Culture   Final    NO GROWTH 5 DAYS Performed at [facility], [place] [address], [place], [place] [address]    Report Status 12/05/2020 FINAL  Final  Resp Panel by RT-PCR (Flu A&B, Covid)     Status: None   Collection Time: 11/30/20  4:27 PM   Specimen: Nasopharyngeal(NP) swabs in vial transport medium  Result Value Ref Range Status   SARS Coronavirus 2 by RT PCR NEGATIVE NEGATIVE Final    Comment: (NOTE) SARS-CoV-2 target nucleic acids are NOT DETECTED.  The SARS-CoV-2 RNA is generally detectable in upper respiratory specimens during the acute phase of infection. The lowest concentration of SARS-CoV-2 viral copies this assay can detect is 138 copies/mL. A negative result does not preclude SARS-Cov-2 infection and should not be used as the sole basis for treatment or other patient management decisions. A negative result may occur with  improper specimen collection/handling,  submission of specimen other than nasopharyngeal swab, presence of viral mutation(s) within the areas targeted by this assay, and inadequate number of viral copies(<138 copies/mL). A negative result must be combined with clinical observations, patient history, and epidemiological information. The expected result is Negative.  Fact Sheet for Patients:  EntrepreneurPulse.com.au  Fact Sheet for Healthcare Providers:  IncredibleEmployment.be  This test is no t yet approved or cleared by the [place] FDA and  has been authorized for detection and/or diagnosis of SARS-CoV-2 by FDA under an Emergency Use Authorization (EUA). This EUA will remain  in effect (meaning this test can be used) for the duration of the COVID-19 declaration under Section 564(b)(1) of the Act, 21 U.S.C.section 360bbb-3(b)(1), unless the authorization is terminated  or revoked sooner.       Influenza A by PCR NEGATIVE NEGATIVE Final   Influenza B by PCR NEGATIVE NEGATIVE Final    Comment: (NOTE) The Xpert Xpress SARS-CoV-2/FLU/RSV plus assay is intended as an aid in the diagnosis of influenza from Nasopharyngeal swab specimens and should not be used as a sole basis for treatment. Nasal washings and aspirates are unacceptable for Xpert Xpress SARS-CoV-2/FLU/RSV testing.  Fact Sheet for Patients: EntrepreneurPulse.com.au  Fact Sheet for Healthcare Providers: IncredibleEmployment.be  This test is not yet approved or cleared by the [place] FDA and has been authorized for detection and/or diagnosis of SARS-CoV-2 by FDA under an Emergency Use Authorization (EUA). This EUA will remain in effect (meaning this test can be used) for the duration of the COVID-19 declaration under Section 564(b)(1) of the Act, 21 U.S.C. section 360bbb-3(b)(1), unless the authorization is terminated or revoked.  Performed at [facility], [place] [address] [place], [place] [address]  MRSA PCR Screening     Status: None   Collection Time: 12/01/20  8:00 PM  Result Value Ref Range Status   MRSA by PCR NEGATIVE NEGATIVE Final    Comment:        The GeneXpert MRSA Assay (FDA approved for NASAL specimens only), is one component of a comprehensive MRSA colonization surveillance program. It is not intended to diagnose MRSA infection nor to guide or monitor treatment for MRSA infections. Performed at [facility], [place] [address], [place], [place] [address]   Expectorated Sputum Assessment w Gram Stain, Rflx to Resp Cult     Status: None   Collection Time: 12/02/20  6:30 AM   Specimen: Expectorated Sputum  Result Value Ref Range Status   Specimen Description EXPECTORATED SPUTUM  Final   Special Requests NONE  Final   Sputum evaluation   Final    Sputum specimen not acceptable for testing.  Please recollect.   INFORM COMMING K. RN  ON 12/02/2020 @ 0706 BY MECIAL J. Performed at [facility], [place] [address] [place], [place] [address]    Report Status 12/02/2020 FINAL  Final  Expectorated Sputum Assessment w Gram Stain, Rflx to Resp Cult     Status: None   Collection Time: 12/02/20 12:07 PM  Result Value Ref Range Status   Specimen Description EXPECTORATED SPUTUM  Final   Special Requests NONE  Final   Sputum evaluation   Final    THIS SPECIMEN IS ACCEPTABLE FOR SPUTUM CULTURE Performed at [facility], [place] [address], [place], [place] [address]    Report Status 12/02/2020 FINAL  Final  Culture, Respiratory w Gram Stain     Status: None   Collection Time: 12/02/20 12:07 PM  Result Value Ref Range Status   Specimen Description   Final    EXPECTORATED SPUTUM Performed at [facility], [place] [address], [place], [place] [address]    Special Requests   Final    NONE Reflexed from [id number] Performed at [facility],  [place] [address], [place], [place] [address]    Gram Stain   Final    ABUNDANT WBC PRESENT,BOTH PMN AND MONONUCLEAR MODERATE GRAM NEGATIVE RODS MODERATE GRAM POSITIVE COCCI FEW GRAM VARIABLE ROD    Culture   Final    RARE Normal respiratory flora-no Staph aureus or Pseudomonas seen Performed at [facility], [address] [place], [place] [address]    Report Status 12/04/2020 FINAL  Final     Labs: BNP (last 3 results) No results for input(s): BNP in the last 8760 hours. Basic Metabolic Panel: Recent Labs  Lab 12/02/20 0359 12/03/20 0315 12/04/20 0412 12/06/20 0713  NA 137 137 135 136  K 3.0* 3.5 3.1* 3.5  CL 99 99 94* 98  CO2 24 27 29 28   GLUCOSE 137* 148* 130* 138*  BUN 17 25* 29* 37*  CREATININE 0.87 0.94 0.94 1.01  CALCIUM 8.9 9.1 9.0 8.9  MG 1.3* 1.8  --   --    Liver Function Tests: No results for input(s): AST, ALT, ALKPHOS, BILITOT, PROT, ALBUMIN in the last 168 hours. No results for input(s): LIPASE, AMYLASE in the last 168 hours. No results for input(s): AMMONIA in the last 168 hours. CBC: Recent Labs  Lab 12/01/20 1602 12/02/20 0359 12/03/20 0315 12/06/20 0713  WBC  --  18.7* 18.8* 9.4  NEUTROABS  --  17.1* 16.9* 7.7  HGB 7.7* 7.7* 8.8* 9.5*  HCT 24.9* 24.9* 28.8* 30.8*  MCV  --  98.8 99.3 99.4  PLT  --  279 274 241   Cardiac Enzymes: No results for input(s): CKTOTAL, CKMB, CKMBINDEX, TROPONINI in the last 168 hours. BNP: Invalid input(s): POCBNP CBG: Recent Labs  Lab 12/07/20 1155 12/07/20 1641 12/07/20 2123 12/08/20 0801 12/08/20 1145  GLUCAP 213* 133* 196* 137* 132*   D-Dimer No results for input(s): DDIMER in the last 72 hours. Hgb A1c No results for input(s): HGBA1C in the last 72 hours. Lipid Profile No results for input(s): CHOL, HDL, LDLCALC, TRIG, CHOLHDL, LDLDIRECT in the last 72 hours. Thyroid function studies No results for input(s): TSH, T4TOTAL, T3FREE, THYROIDAB in the last 72 hours.  Invalid input(s):  FREET3 Anemia work up No results for input(s): VITAMINB12, FOLATE, FERRITIN, TIBC, IRON, RETICCTPCT in the last 72 hours. Urinalysis    Component Value Date/Time   COLORURINE YELLOW 11/30/2020 1627   APPEARANCEUR HAZY (A) 11/30/2020 1627   LABSPEC 1.018 11/30/2020 1627   PHURINE 5.0 11/30/2020 1627   GLUCOSEU NEGATIVE 11/30/2020 1627   HGBUR NEGATIVE 11/30/2020 1627   BILIRUBINUR NEGATIVE 11/30/2020 1627   KETONESUR NEGATIVE 11/30/2020 1627   PROTEINUR 30 (A) 11/30/2020 1627   NITRITE NEGATIVE 11/30/2020 1627   LEUKOCYTESUR NEGATIVE 11/30/2020 1627   Sepsis Labs Invalid input(s): PROCALCITONIN,  WBC,  LACTICIDVEN Microbiology Recent Results (from the past 240 hour(s))  Urine culture     Status: Abnormal   Collection Time: 11/30/20  4:27 PM   Specimen: In/Out Cath Urine  Result Value Ref Range Status   Specimen Description   Final    IN/OUT CATH URINE Performed at [facility], [place] [address], [place], [place] [address]    Special Requests   Final    NONE Performed at [facility], [place] [address] [place], [place] [address]    Culture 50,000 COLONIES/mL PROTEUS MIRABILIS (A)  Final   Report Status 12/03/2020 FINAL  Final   Organism ID, Bacteria PROTEUS MIRABILIS (A)  Final      Susceptibility   Proteus mirabilis - MIC*    AMPICILLIN <=2 SENSITIVE Sensitive     CEFAZOLIN <=4 SENSITIVE Sensitive     CEFEPIME <=0.12 SENSITIVE Sensitive     CEFTRIAXONE <=0.25 SENSITIVE Sensitive     CIPROFLOXACIN <=0.25 SENSITIVE Sensitive     GENTAMICIN <=1 SENSITIVE Sensitive     IMIPENEM 2 SENSITIVE Sensitive     NITROFURANTOIN RESISTANT Resistant     TRIMETH/SULFA <=20 SENSITIVE Sensitive     AMPICILLIN/SULBACTAM <=2 SENSITIVE Sensitive     PIP/TAZO <=4 SENSITIVE Sensitive     * 50,000 COLONIES/mL PROTEUS MIRABILIS  Blood Culture (routine x 2)     Status: None   Collection Time: 11/30/20  4:27 PM   Specimen: BLOOD  Result Value Ref Range  Status   Specimen Description   Final    BLOOD LEFT ANTECUBITAL Performed at [facility] [address], [place], [place] [address]    Special Requests   Final    BOTTLES DRAWN AEROBIC AND ANAEROBIC Blood Culture adequate volume Performed at [facility] [address] [place], [place] [address]    Culture   Final    NO GROWTH 5 DAYS Performed at [facility], [place] [address] [place], [place] [address]    Report Status 12/05/2020 FINAL  Final  Blood Culture (routine x 2)     Status: None   Collection Time: 11/30/20  4:27  PM   Specimen: BLOOD  Result Value Ref Range Status   Specimen Description   Final    BLOOD RIGHT ANTECUBITAL Performed at [facility] [address] [place], [place] [address]    Special Requests   Final    BOTTLES DRAWN AEROBIC AND ANAEROBIC Blood Culture adequate volume Performed at [facility] [address], [place], [place] [address]    Culture   Final    NO GROWTH 5 DAYS Performed at [facility], [place] [address], [place], [place] [address]    Report Status 12/05/2020 FINAL  Final  Resp Panel by RT-PCR (Flu A&B, Covid)     Status: None   Collection Time: 11/30/20  4:27 PM   Specimen: Nasopharyngeal(NP) swabs in vial transport medium  Result Value Ref Range Status   SARS Coronavirus 2 by RT PCR NEGATIVE NEGATIVE Final    Comment: (NOTE) SARS-CoV-2 target nucleic acids are NOT DETECTED.  The SARS-CoV-2 RNA is generally detectable in upper respiratory specimens during the acute phase of infection. The lowest concentration of SARS-CoV-2 viral copies this assay can detect is 138 copies/mL. A negative result does not preclude SARS-Cov-2 infection and should not be used as the sole basis for treatment or other patient management decisions. A negative result may occur with  improper specimen collection/handling, submission of specimen other than nasopharyngeal  swab, presence of viral mutation(s) within the areas targeted by this assay, and inadequate number of viral copies(<138 copies/mL). A negative result must be combined with clinical observations, patient history, and epidemiological information. The expected result is Negative.  Fact Sheet for Patients:  EntrepreneurPulse.com.au  Fact Sheet for Healthcare Providers:  IncredibleEmployment.be  This test is no t yet approved or cleared by the [place] FDA and  has been authorized for detection and/or diagnosis of SARS-CoV-2 by FDA under an Emergency Use Authorization (EUA). This EUA will remain  in effect (meaning this test can be used) for the duration of the COVID-19 declaration under Section 564(b)(1) of the Act, 21 U.S.C.section 360bbb-3(b)(1), unless the authorization is terminated  or revoked sooner.       Influenza A by PCR NEGATIVE NEGATIVE Final   Influenza B by PCR NEGATIVE NEGATIVE Final    Comment: (NOTE) The Xpert Xpress SARS-CoV-2/FLU/RSV plus assay is intended as an aid in the diagnosis of influenza from Nasopharyngeal swab specimens and should not be used as a sole basis for treatment. Nasal washings and aspirates are unacceptable for Xpert Xpress SARS-CoV-2/FLU/RSV testing.  Fact Sheet for Patients: EntrepreneurPulse.com.au  Fact Sheet for Healthcare Providers: IncredibleEmployment.be  This test is not yet approved or cleared by the [place] FDA and has been authorized for detection and/or diagnosis of SARS-CoV-2 by FDA under an Emergency Use Authorization (EUA). This EUA will remain in effect (meaning this test can be used) for the duration of the COVID-19 declaration under Section 564(b)(1) of the Act, 21 U.S.C. section 360bbb-3(b)(1), unless the authorization is terminated or revoked.  Performed at [facility], [place] [address] [place],  [address]    MRSA PCR Screening     Status: None   Collection Time: 12/01/20  8:00 PM  Result Value Ref Range Status   MRSA by PCR NEGATIVE NEGATIVE Final    Comment:        The GeneXpert MRSA Assay (FDA approved for NASAL specimens only), is one component of a comprehensive MRSA colonization surveillance program. It is not intended to diagnose MRSA infection  nor to guide or monitor treatment for MRSA infections. Performed at [facility], [place] [address] [place], [place] [address]   Expectorated Sputum Assessment w Gram Stain, Rflx to Resp Cult     Status: None   Collection Time: 12/02/20  6:30 AM   Specimen: Expectorated Sputum  Result Value Ref Range Status   Specimen Description EXPECTORATED SPUTUM  Final   Special Requests NONE  Final   Sputum evaluation   Final    Sputum specimen not acceptable for testing.  Please recollect.   INFORM COMMING K. RN  ON 12/02/2020 @ 0706 BY MECIAL J. Performed at [facility], [place] [address], [place], [place] [address]    Report Status 12/02/2020 FINAL  Final  Expectorated Sputum Assessment w Gram Stain, Rflx to Resp Cult     Status: None   Collection Time: 12/02/20 12:07 PM  Result Value Ref Range Status   Specimen Description EXPECTORATED SPUTUM  Final   Special Requests NONE  Final   Sputum evaluation   Final    THIS SPECIMEN IS ACCEPTABLE FOR SPUTUM CULTURE Performed at [facility], [place] [address] [place], [place] [address]    Report Status 12/02/2020 FINAL  Final  Culture, Respiratory w Gram Stain     Status: None   Collection Time: 12/02/20 12:07 PM  Result Value Ref Range Status   Specimen Description   Final    EXPECTORATED SPUTUM Performed at [facility], [place] [address], [place], [place] [address]    Special Requests   Final    NONE Reflexed from [id number] Performed at [facility], [place] [address], [place], [place] [address]    Gram Stain    Final    ABUNDANT WBC PRESENT,BOTH PMN AND MONONUCLEAR MODERATE GRAM NEGATIVE RODS MODERATE GRAM POSITIVE COCCI FEW GRAM VARIABLE ROD    Culture   Final    RARE Normal respiratory flora-no Staph aureus or Pseudomonas seen Performed at [facility], [address], [place], [place] [address]    Report Status 12/04/2020 FINAL  Final    Procedures/Studies: CT Angio Chest Pulmonary Embolism (PE) W or WO Contrast  Result Date: 12/01/2020 CLINICAL DATA:  75 year old male with concern for pulmonary embolism. History of non-small cell lung cancer status post radiation and immunotherapy and ongoing chemotherapy. EXAM: CT ANGIOGRAPHY CHEST WITH CONTRAST TECHNIQUE: Multidetector CT imaging of the chest was performed using the standard protocol during bolus administration of intravenous contrast. Multiplanar CT image reconstructions and MIPs were obtained to evaluate the vascular anatomy. CONTRAST:  59mL OMNIPAQUE IOHEXOL 350 MG/ML SOLN COMPARISON:  Chest CT dated 06/07/2020. chest radiograph dated 11/30/2020. FINDINGS: Cardiovascular: There is no cardiomegaly. Small pericardial effusion similar to prior CT. Mild atherosclerotic calcification of the thoracic aorta. No aneurysmal dilatation or dissection. Evaluation of the pulmonary arteries is limited due to respiratory motion artifact and suboptimal visualization of the peripheral branches. No large or central pulmonary artery embolus identified. Mediastinum/Nodes: Mildly enlarged left hilar lymph node measures approximately 2 cm. Evaluation of the right hilum and mediastinum is limited due to consolidative changes of the right lung. The esophagus is grossly unremarkable. No mediastinal fluid collection. Lungs/Pleura: There is a large area of consolidative change with air bronchogram involving the right lung and predominantly right middle and right lower lobe, likely post treatment changes. There is overall decreased right lung volume with slight shift  of the mediastinum to the right of the midline. Small loculated appearing  right pleural effusion. There is a 5 x 7 cm fluid-filled cavitary mass in the superior segment of the left lower lobe containing small pockets of air. There is a small left pleural effusion. There is a 9 mm left upper lobe nodule with irregular margins (49/10), minimally decreased since the prior CT (previously measuring approximately 10 mm. There is no pneumothorax. There is high-grade narrowing of the bronchus intermedius. The central airways are otherwise patent. Upper Abdomen: No acute abnormality. Musculoskeletal: No chest wall abnormality. No acute or significant osseous findings. Review of the MIP images confirms the above findings. IMPRESSION: 1. No CT evidence of central pulmonary artery embolus. 2. Large area of consolidative change with air bronchogram involving the right lung, likely post treatment changes. 3. A 5 x 7 cm fluid-filled cavitary lesion in the left lower lobe. This may represent a necrotic mass/neoplasm or an infectious process. 4. Mildly enlarged left hilar lymph node. 5. Small loculated appearing right pleural effusion. 6. Small left pleural effusion. 7. Small pericardial effusion similar to prior CT. 8. A 9 mm left upper lobe nodule, minimally decreased in size since the prior CT. 9. Aortic Atherosclerosis (ICD10-I70.0). Electronically Signed   By: Anner Crete M.D.   On: 12/01/2020 15:54   DG Chest Port 1 View  Result Date: 11/30/2020 CLINICAL DATA:  Fever weakness EXAM: PORTABLE CHEST 1 VIEW COMPARISON:  10/22/2020, CT 06/07/2020, 10/04/2020, PET CT 10/15/2020 FINDINGS: Large cavitary mass lesion in the left lower lobe. Not much interval change in volume loss on the right with pleural thickening or loculated effusion and airspace disease in the right mid to lower lung. Mild shift of mediastinal contents to the right. Partially obscured cardiomediastinal silhouette. No pneumothorax. IMPRESSION: 1. Large  left lower lobe cavitary lung mass as before. Improved aeration on the left with better aeration of left lung base since May 2022 comparison 2. Not much interval change in right hemithorax volume loss, right pleural thickening or loculated effusion and airspace disease in the right mid to lower lung. Electronically Signed   By: Donavan Foil M.D.   On: 11/30/2020 16:22     Time coordinating discharge: Over 30 minutes    Dwyane Dee, MD  [facility] 12/08/2020, 12:28 PM

## 2020-12-08 NOTE — Progress Notes (Addendum)
Progress Note  Patient Name: Jeremy [name] Date of Encounter: 12/08/2020  [facility] Cardiologist: Carlyle Dolly, MD   Subjective   No chest pain or palpitations. Intermittent cough.   Inpatient Medications    Scheduled Meds:  docusate sodium  100 mg Oral QHS   feeding supplement  237 mL Oral Q24H   ferrous sulfate  325 mg Oral Q supper   folic acid  1 mg Oral QPM   guaiFENesin  600 mg Oral BID   insulin aspart  0-9 Units Subcutaneous TID WC   metoprolol tartrate  25 mg Oral Q6H   multivitamin with minerals  1 tablet Oral Daily   omega-3 acid ethyl esters  1 g Oral Daily   prednisoLONE acetate  1 drop Left Eye Q12H   sodium chloride flush  3 mL Intravenous Q12H   tamsulosin  0.4 mg Oral Daily   Continuous Infusions:  PRN Meds: acetaminophen **OR** acetaminophen, albuterol, bisacodyl, calcium carbonate, cyclobenzaprine, gabapentin, meclizine, oxyCODONE-acetaminophen **AND** oxyCODONE, traMADol   Vital Signs    Vitals:   12/07/20 1409 12/07/20 2043 12/08/20 0008 12/08/20 0506  BP: 119/73 (!) 111/59 (!) 117/59 122/78  Pulse: 100 88 97 93  Resp:  18    Temp: 98 F (36.7 C) 98 F (36.7 C)  97.9 F (36.6 C)  TempSrc: Oral Oral  Oral  SpO2: 98% 94%  100%  Weight:      Height:        Intake/Output Summary (Last 24 hours) at 12/08/2020 0903 Last data filed at 12/08/2020 0600 Gross per 24 hour  Intake 160 ml  Output 200 ml  Net -40 ml   Last 3 Weights 11/30/2020 11/10/2020 11/04/2020  Weight (lbs) 183 lb 183 lb 2 oz 185 lb 3.2 oz  Weight (kg) 83.008 kg 83.065 kg 84.006 kg      Telemetry    AFib, HR 80-90s- Personally Reviewed  ECG    N/A  Physical Exam   GEN: No acute distress.   Neck: No JVD Cardiac: IR IR,  no murmurs, rubs, or gallops.  Respiratory: course breath sound  GI: Soft, nontender, non-distended  MS: No edema; No deformity. Neuro:  Nonfocal  Psych: Normal affect   Labs    Chemistry Recent Labs  Lab 12/03/20 0315 12/04/20 0412  12/06/20 0713  NA 137 135 136  K 3.5 3.1* 3.5  CL 99 94* 98  CO2 27 29 28   GLUCOSE 148* 130* 138*  BUN 25* 29* 37*  CREATININE 0.94 0.94 1.01  CALCIUM 9.1 9.0 8.9  GFRNONAA >60 >60 >60  ANIONGAP 11 12 10      Hematology Recent Labs  Lab 12/02/20 0359 12/03/20 0315 12/06/20 0713  WBC 18.7* 18.8* 9.4  RBC 2.52* 2.90* 3.10*  HGB 7.7* 8.8* 9.5*  HCT 24.9* 28.8* 30.8*  MCV 98.8 99.3 99.4  MCH 30.6 30.3 30.6  MCHC 30.9 30.6 30.8  RDW 18.0* 17.5* 16.8*  PLT 279 274 241    Radiology    No results found.  Cardiac Studies   N/A  Patient Profile     75 y.o. 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   Assessment & Plan    Atrial fibrillation with RVR - AFib RVR in setting of severe sepsis. - Plan for rate control strategy and avoid chemical cardioversion as patient can not be anticoagulation due to  chronic anemia requiring transfusion - Stopped amiodarone yesterday morning as recommended - HR currently stable at 80-90s - Consolidate metoprolol to 50mg  BID   - Cardizem CD 360mg  at home (stopped). - BP stable - CHADSVASCs score of 4>> not a anticoagulate candidate   [facility] will sign off.   Medication Recommendations:  Metoprolol 50mg  BID (titrate for additional rate control as needed ) Other recommendations (labs, testing, etc):  None Follow up as an outpatient:  with Dr. [name] @ 02/02/21  For questions or updates, please contact [facility] Please consult [email or web address] for contact info under        SignedLeanor Kail, PA  12/08/2020, 9:03 AM     Attending Note:   The patient was seen and examined.  Agree with assessment and plan as noted above.  Changes made to the above note as needed.  Patient seen and independently examined with Robbie Lis, PA .   We discussed all aspects of the encounter. I  agree with the assessment and plan as stated above.    Atrial fib with RVR The plan is for rate control at this point Unable to anticoagulate HR is been stable on metoprolol   Follow up with Dr. [name].   I have spent a total of 40 minutes with patient reviewing hospital  notes , telemetry, EKGs, labs and examining patient as well as establishing an assessment and plan that was discussed with the patient.  > 50% of time was spent in direct patient care.    Thayer Headings, Brooke Bonito., MD, [facility] 12/08/2020, 1:19 PM [address]  [place] Pager [phone]

## 2020-12-08 NOTE — Progress Notes (Signed)
Report given to [facility]. All questions were answered.

## 2020-12-08 NOTE — Care Management Important Message (Signed)
Important Message  Patient Details IM Letter placed in Patient's room Name: Jeremy [name] MRN: [id number] Date of Birth: 1946/05/11   Medicare Important Message Given:  Yes     Kerin Salen 12/08/2020, 10:00 AM

## 2020-12-08 NOTE — Consult Note (Signed)
[facility] Inpatient Consult   12/08/2020  Jeremy [name] [birth year] [id number]  Patient screened for high risk score for unplanned readmission. Chart reviewed to assess for potential [place] Management community service needs. Per review, patient is being recommended for a skilled nursing facility level of care.    No [facility] Care Management follow up needs at this time.  Of note, [facility] Care Management services does not replace or interfere with any services that are arranged by inpatient case management or social work.   [name], MSN, [facility] Liaison Nurse Mobile Phone [phone]  Toll free office [phone]

## 2020-12-09 ENCOUNTER — Ambulatory Visit: Payer: Medicare Other

## 2020-12-09 DIAGNOSIS — N390 Urinary tract infection, site not specified: Secondary | ICD-10-CM | POA: Diagnosis not present

## 2020-12-09 DIAGNOSIS — G4700 Insomnia, unspecified: Secondary | ICD-10-CM | POA: Diagnosis not present

## 2020-12-09 DIAGNOSIS — L03113 Cellulitis of right upper limb: Secondary | ICD-10-CM | POA: Diagnosis not present

## 2020-12-09 DIAGNOSIS — A419 Sepsis, unspecified organism: Secondary | ICD-10-CM | POA: Diagnosis not present

## 2020-12-10 ENCOUNTER — Ambulatory Visit: Payer: Medicare Other

## 2020-12-13 ENCOUNTER — Ambulatory Visit: Payer: Medicare Other

## 2020-12-14 ENCOUNTER — Ambulatory Visit: Payer: Medicare Other

## 2020-12-15 ENCOUNTER — Ambulatory Visit: Payer: Medicare Other

## 2020-12-16 ENCOUNTER — Ambulatory Visit: Payer: Medicare Other

## 2020-12-16 ENCOUNTER — Institutional Professional Consult (permissible substitution): Payer: Medicare Other | Admitting: Emergency Medicine

## 2020-12-17 ENCOUNTER — Ambulatory Visit: Payer: Medicare Other

## 2020-12-20 ENCOUNTER — Ambulatory Visit
Admission: RE | Admit: 2020-12-20 | Discharge: 2020-12-20 | Disposition: A | Discharge status: Home / Self Care | Payer: Medicare Other | Source: Ambulatory Visit | Attending: Radiation Oncology | Admitting: Radiation Oncology

## 2020-12-20 ENCOUNTER — Other Ambulatory Visit: Payer: Self-pay

## 2020-12-20 DIAGNOSIS — C3491 Malignant neoplasm of unspecified part of right bronchus or lung: Secondary | ICD-10-CM | POA: Diagnosis not present

## 2020-12-20 DIAGNOSIS — R509 Fever, unspecified: Secondary | ICD-10-CM | POA: Diagnosis not present

## 2020-12-20 DIAGNOSIS — C3432 Malignant neoplasm of lower lobe, left bronchus or lung: Secondary | ICD-10-CM | POA: Diagnosis not present

## 2020-12-20 DIAGNOSIS — R918 Other nonspecific abnormal finding of lung field: Secondary | ICD-10-CM | POA: Diagnosis not present

## 2020-12-21 ENCOUNTER — Ambulatory Visit
Admission: RE | Admit: 2020-12-21 | Discharge: 2020-12-21 | Disposition: A | Discharge status: Home / Self Care | Payer: Medicare Other | Source: Ambulatory Visit | Attending: Radiation Oncology | Admitting: Radiation Oncology

## 2020-12-21 DIAGNOSIS — R918 Other nonspecific abnormal finding of lung field: Secondary | ICD-10-CM | POA: Diagnosis not present

## 2020-12-21 DIAGNOSIS — R509 Fever, unspecified: Secondary | ICD-10-CM | POA: Diagnosis not present

## 2020-12-21 DIAGNOSIS — C3432 Malignant neoplasm of lower lobe, left bronchus or lung: Secondary | ICD-10-CM | POA: Diagnosis not present

## 2020-12-21 DIAGNOSIS — C3491 Malignant neoplasm of unspecified part of right bronchus or lung: Secondary | ICD-10-CM | POA: Diagnosis not present

## 2020-12-22 ENCOUNTER — Ambulatory Visit
Admission: RE | Admit: 2020-12-22 | Discharge: 2020-12-22 | Disposition: A | Discharge status: Home / Self Care | Payer: Medicare Other | Source: Ambulatory Visit | Attending: Radiation Oncology | Admitting: Radiation Oncology

## 2020-12-22 DIAGNOSIS — C3491 Malignant neoplasm of unspecified part of right bronchus or lung: Secondary | ICD-10-CM | POA: Diagnosis not present

## 2020-12-22 DIAGNOSIS — R509 Fever, unspecified: Secondary | ICD-10-CM | POA: Diagnosis not present

## 2020-12-22 DIAGNOSIS — C3432 Malignant neoplasm of lower lobe, left bronchus or lung: Secondary | ICD-10-CM | POA: Diagnosis not present

## 2020-12-22 DIAGNOSIS — R918 Other nonspecific abnormal finding of lung field: Secondary | ICD-10-CM | POA: Diagnosis not present

## 2020-12-23 ENCOUNTER — Ambulatory Visit
Admission: RE | Admit: 2020-12-23 | Discharge: 2020-12-23 | Disposition: A | Discharge status: Home / Self Care | Payer: Medicare Other | Source: Ambulatory Visit | Attending: Radiation Oncology | Admitting: Radiation Oncology

## 2020-12-23 ENCOUNTER — Other Ambulatory Visit: Payer: Self-pay

## 2020-12-23 DIAGNOSIS — C3432 Malignant neoplasm of lower lobe, left bronchus or lung: Secondary | ICD-10-CM | POA: Diagnosis not present

## 2020-12-23 DIAGNOSIS — R509 Fever, unspecified: Secondary | ICD-10-CM | POA: Diagnosis not present

## 2020-12-23 DIAGNOSIS — C3491 Malignant neoplasm of unspecified part of right bronchus or lung: Secondary | ICD-10-CM | POA: Diagnosis not present

## 2020-12-23 DIAGNOSIS — R918 Other nonspecific abnormal finding of lung field: Secondary | ICD-10-CM | POA: Diagnosis not present

## 2020-12-24 ENCOUNTER — Ambulatory Visit
Admission: RE | Admit: 2020-12-24 | Discharge: 2020-12-24 | Disposition: A | Discharge status: Home / Self Care | Payer: Medicare Other | Source: Ambulatory Visit | Attending: Radiation Oncology | Admitting: Radiation Oncology

## 2020-12-24 DIAGNOSIS — R509 Fever, unspecified: Secondary | ICD-10-CM | POA: Diagnosis not present

## 2020-12-24 DIAGNOSIS — C3432 Malignant neoplasm of lower lobe, left bronchus or lung: Secondary | ICD-10-CM | POA: Diagnosis not present

## 2020-12-24 DIAGNOSIS — C3491 Malignant neoplasm of unspecified part of right bronchus or lung: Secondary | ICD-10-CM | POA: Diagnosis not present

## 2020-12-24 DIAGNOSIS — R918 Other nonspecific abnormal finding of lung field: Secondary | ICD-10-CM | POA: Diagnosis not present

## 2020-12-27 ENCOUNTER — Other Ambulatory Visit: Payer: Self-pay

## 2020-12-27 ENCOUNTER — Ambulatory Visit
Admission: RE | Admit: 2020-12-27 | Discharge: 2020-12-27 | Disposition: A | Discharge status: Home / Self Care | Payer: Medicare Other | Source: Ambulatory Visit | Attending: Radiation Oncology | Admitting: Radiation Oncology

## 2020-12-27 DIAGNOSIS — C3432 Malignant neoplasm of lower lobe, left bronchus or lung: Secondary | ICD-10-CM | POA: Diagnosis not present

## 2020-12-27 DIAGNOSIS — C3491 Malignant neoplasm of unspecified part of right bronchus or lung: Secondary | ICD-10-CM | POA: Diagnosis not present

## 2020-12-27 DIAGNOSIS — R509 Fever, unspecified: Secondary | ICD-10-CM | POA: Diagnosis not present

## 2020-12-27 DIAGNOSIS — W19XXXA Unspecified fall, initial encounter: Secondary | ICD-10-CM | POA: Diagnosis not present

## 2020-12-27 DIAGNOSIS — R918 Other nonspecific abnormal finding of lung field: Secondary | ICD-10-CM | POA: Diagnosis not present

## 2020-12-27 DIAGNOSIS — R0902 Hypoxemia: Secondary | ICD-10-CM | POA: Diagnosis not present

## 2020-12-28 ENCOUNTER — Ambulatory Visit
Admission: RE | Admit: 2020-12-28 | Discharge: 2020-12-28 | Disposition: A | Discharge status: Home / Self Care | Payer: Medicare Other | Source: Ambulatory Visit | Attending: Radiation Oncology | Admitting: Radiation Oncology

## 2020-12-28 DIAGNOSIS — R918 Other nonspecific abnormal finding of lung field: Secondary | ICD-10-CM

## 2020-12-28 DIAGNOSIS — R509 Fever, unspecified: Secondary | ICD-10-CM | POA: Diagnosis not present

## 2020-12-28 DIAGNOSIS — C3491 Malignant neoplasm of unspecified part of right bronchus or lung: Secondary | ICD-10-CM | POA: Diagnosis not present

## 2020-12-28 DIAGNOSIS — C3432 Malignant neoplasm of lower lobe, left bronchus or lung: Secondary | ICD-10-CM | POA: Diagnosis not present

## 2020-12-28 LAB — CBC WITH DIFFERENTIAL (CANCER CENTER ONLY)
Abs Immature Granulocytes: 0.12 10*3/uL — ABNORMAL HIGH (ref 0.00–0.07)
Basophils Absolute: 0 10*3/uL (ref 0.0–0.1)
Basophils Relative: 0 %
Eosinophils Absolute: 0.4 10*3/uL (ref 0.0–0.5)
Eosinophils Relative: 3 %
HCT: 23.3 % — ABNORMAL LOW (ref 39.0–52.0)
Hemoglobin: 7.1 g/dL — ABNORMAL LOW (ref 13.0–17.0)
Immature Granulocytes: 1 %
Lymphocytes Relative: 4 %
Lymphs Abs: 0.5 10*3/uL — ABNORMAL LOW (ref 0.7–4.0)
MCH: 29.7 pg (ref 26.0–34.0)
MCHC: 30.5 g/dL (ref 30.0–36.0)
MCV: 97.5 fL (ref 80.0–100.0)
Monocytes Absolute: 0.4 10*3/uL (ref 0.1–1.0)
Monocytes Relative: 3 %
Neutro Abs: 11.5 10*3/uL — ABNORMAL HIGH (ref 1.7–7.7)
Neutrophils Relative %: 89 %
Platelet Count: 231 10*3/uL (ref 150–400)
RBC: 2.39 MIL/uL — ABNORMAL LOW (ref 4.22–5.81)
RDW: 16.5 % — ABNORMAL HIGH (ref 11.5–15.5)
WBC Count: 13 10*3/uL — ABNORMAL HIGH (ref 4.0–10.5)
nRBC: 0 % (ref 0.0–0.2)

## 2020-12-28 LAB — BASIC METABOLIC PANEL - CANCER CENTER ONLY
Anion gap: 12 (ref 5–15)
BUN: 15 mg/dL (ref 8–23)
CO2: 27 mmol/L (ref 22–32)
Calcium: 9.3 mg/dL (ref 8.9–10.3)
Chloride: 101 mmol/L (ref 98–111)
Creatinine: 0.82 mg/dL (ref 0.61–1.24)
GFR, Estimated: >60 mL/min (ref >60)
Glucose, Bld: 105 mg/dL — ABNORMAL HIGH (ref 70–99)
Potassium: 4.1 mmol/L (ref 3.5–5.1)
Sodium: 140 mmol/L (ref 135–145)

## 2020-12-28 LAB — URINALYSIS, COMPLETE (UACMP) WITH MICROSCOPIC
Bilirubin Urine: NEGATIVE
Glucose, UA: NEGATIVE mg/dL
Hgb urine dipstick: NEGATIVE
Ketones, ur: NEGATIVE mg/dL
Leukocytes,Ua: NEGATIVE
Nitrite: NEGATIVE
Protein, ur: 100 mg/dL — AB
Specific Gravity, Urine: 1.019 (ref 1.005–1.030)
pH: 5 (ref 5.0–8.0)

## 2020-12-29 ENCOUNTER — Telehealth: Payer: Self-pay

## 2020-12-29 ENCOUNTER — Ambulatory Visit
Admission: RE | Admit: 2020-12-29 | Discharge: 2020-12-29 | Disposition: A | Discharge status: Home / Self Care | Payer: Medicare Other | Source: Ambulatory Visit | Attending: Radiation Oncology | Admitting: Radiation Oncology

## 2020-12-29 ENCOUNTER — Other Ambulatory Visit: Payer: Self-pay | Admitting: Radiology

## 2020-12-29 ENCOUNTER — Other Ambulatory Visit: Payer: Self-pay

## 2020-12-29 DIAGNOSIS — C3491 Malignant neoplasm of unspecified part of right bronchus or lung: Secondary | ICD-10-CM | POA: Diagnosis not present

## 2020-12-29 DIAGNOSIS — D630 Anemia in neoplastic disease: Secondary | ICD-10-CM

## 2020-12-29 DIAGNOSIS — R918 Other nonspecific abnormal finding of lung field: Secondary | ICD-10-CM | POA: Diagnosis not present

## 2020-12-29 DIAGNOSIS — R509 Fever, unspecified: Secondary | ICD-10-CM | POA: Diagnosis not present

## 2020-12-29 DIAGNOSIS — C3432 Malignant neoplasm of lower lobe, left bronchus or lung: Secondary | ICD-10-CM | POA: Diagnosis not present

## 2020-12-29 LAB — CBC WITH DIFFERENTIAL (CANCER CENTER ONLY)
Abs Immature Granulocytes: 0.12 10*3/uL — ABNORMAL HIGH (ref 0.00–0.07)
Basophils Absolute: 0 10*3/uL (ref 0.0–0.1)
Basophils Relative: 0 %
Eosinophils Absolute: 0.5 10*3/uL (ref 0.0–0.5)
Eosinophils Relative: 3 %
HCT: 20.9 % — ABNORMAL LOW (ref 39.0–52.0)
Hemoglobin: 6.5 g/dL — CL (ref 13.0–17.0)
Immature Granulocytes: 1 %
Lymphocytes Relative: 5 %
Lymphs Abs: 0.6 10*3/uL — ABNORMAL LOW (ref 0.7–4.0)
MCH: 29.8 pg (ref 26.0–34.0)
MCHC: 31.1 g/dL (ref 30.0–36.0)
MCV: 95.9 fL (ref 80.0–100.0)
Monocytes Absolute: 0.5 10*3/uL (ref 0.1–1.0)
Monocytes Relative: 4 %
Neutro Abs: 11.7 10*3/uL — ABNORMAL HIGH (ref 1.7–7.7)
Neutrophils Relative %: 87 %
Platelet Count: 217 10*3/uL (ref 150–400)
RBC: 2.18 MIL/uL — ABNORMAL LOW (ref 4.22–5.81)
RDW: 16.8 % — ABNORMAL HIGH (ref 11.5–15.5)
WBC Count: 13.4 10*3/uL — ABNORMAL HIGH (ref 4.0–10.5)
nRBC: 0 % (ref 0.0–0.2)

## 2020-12-29 LAB — CMP (CANCER CENTER ONLY)
ALT: 17 U/L (ref 0–44)
AST: 15 U/L (ref 15–41)
Albumin: 2.1 g/dL — ABNORMAL LOW (ref 3.5–5.0)
Alkaline Phosphatase: 68 U/L (ref 38–126)
Anion gap: 11 (ref 5–15)
BUN: 20 mg/dL (ref 8–23)
CO2: 26 mmol/L (ref 22–32)
Calcium: 9.1 mg/dL (ref 8.9–10.3)
Chloride: 103 mmol/L (ref 98–111)
Creatinine: 0.93 mg/dL (ref 0.61–1.24)
GFR, Estimated: >60 mL/min (ref >60)
Glucose, Bld: 114 mg/dL — ABNORMAL HIGH (ref 70–99)
Potassium: 4.2 mmol/L (ref 3.5–5.1)
Sodium: 140 mmol/L (ref 135–145)
Total Bilirubin: 0.3 mg/dL (ref 0.3–1.2)
Total Protein: 7.7 g/dL (ref 6.5–8.1)

## 2020-12-29 NOTE — Telephone Encounter (Signed)
Pt is in need of 2U PRBC as his hgb is 6.5. Unfortunately, due to staffing we are not able to accommodate at this time. Request sent to [facility] today for transfusion 7/28. Pt's wife is aware that we have not yet heard from [facility]. Pt denies dizziness, tachycardia, ShOB, chest pains. Pt knows if he starts feeling symptomatic he should go to ED.

## 2020-12-30 ENCOUNTER — Ambulatory Visit
Admission: RE | Admit: 2020-12-30 | Discharge: 2020-12-30 | Disposition: A | Discharge status: Home / Self Care | Payer: Medicare Other | Source: Ambulatory Visit | Attending: Radiation Oncology | Admitting: Radiation Oncology

## 2020-12-30 ENCOUNTER — Other Ambulatory Visit: Payer: Self-pay | Admitting: Physician Assistant

## 2020-12-30 ENCOUNTER — Telehealth: Payer: Self-pay

## 2020-12-30 ENCOUNTER — Other Ambulatory Visit: Payer: Self-pay

## 2020-12-30 ENCOUNTER — Telehealth: Payer: Self-pay | Admitting: Internal Medicine

## 2020-12-30 ENCOUNTER — Other Ambulatory Visit: Payer: Medicare Other

## 2020-12-30 DIAGNOSIS — R509 Fever, unspecified: Secondary | ICD-10-CM | POA: Diagnosis not present

## 2020-12-30 DIAGNOSIS — D649 Anemia, unspecified: Secondary | ICD-10-CM

## 2020-12-30 DIAGNOSIS — C3491 Malignant neoplasm of unspecified part of right bronchus or lung: Secondary | ICD-10-CM | POA: Diagnosis not present

## 2020-12-30 DIAGNOSIS — R918 Other nonspecific abnormal finding of lung field: Secondary | ICD-10-CM | POA: Diagnosis not present

## 2020-12-30 DIAGNOSIS — C3432 Malignant neoplasm of lower lobe, left bronchus or lung: Secondary | ICD-10-CM | POA: Diagnosis not present

## 2020-12-30 LAB — PREPARE RBC (CROSSMATCH)

## 2020-12-30 NOTE — Telephone Encounter (Signed)
Per [facility] another T&S is not needed for pts transfusion tomorrow. I have cancelled pts lab appt and advised pts wife of this.  I have also faxed the pickup slip to [facility] and the [facility]. Pt blue band number is: [id number].

## 2020-12-30 NOTE — Telephone Encounter (Signed)
I spoke with pts wife advising he will need another lab appt today for his blood transfusion scheduled for tomorrow.  I have advised his lab will be today at 1:15p before his radiation tx. Also, the address to our [facility] location for his transfusion tomorrow with an arrival time of 8:15a has also been relayed to her. Pts wife expressed understanding of all this information.

## 2020-12-30 NOTE — Telephone Encounter (Signed)
Scheduled appt per 7/28 sch msg. Pt's wife is aware.

## 2020-12-31 ENCOUNTER — Other Ambulatory Visit: Payer: Self-pay | Admitting: Radiation Oncology

## 2020-12-31 ENCOUNTER — Telehealth: Payer: Self-pay | Admitting: Radiology

## 2020-12-31 ENCOUNTER — Ambulatory Visit: Payer: Medicare Other

## 2020-12-31 ENCOUNTER — Other Ambulatory Visit: Payer: Self-pay

## 2020-12-31 ENCOUNTER — Inpatient Hospital Stay: Payer: Medicare Other | Attending: Internal Medicine

## 2020-12-31 DIAGNOSIS — C3432 Malignant neoplasm of lower lobe, left bronchus or lung: Secondary | ICD-10-CM | POA: Insufficient documentation

## 2020-12-31 DIAGNOSIS — D649 Anemia, unspecified: Secondary | ICD-10-CM | POA: Insufficient documentation

## 2020-12-31 MED ORDER — ACETAMINOPHEN 325 MG PO TABS
650.0000 mg | ORAL_TABLET | Freq: Once | ORAL | Status: AC
  Administered 2020-12-31: 650 mg via ORAL
  Filled 2020-12-31: qty 2

## 2020-12-31 MED ORDER — SODIUM CHLORIDE 0.9% IV SOLUTION
250.0000 mL | Freq: Once | INTRAVENOUS | Status: AC
  Administered 2020-12-31: 250 mL via INTRAVENOUS
  Filled 2020-12-31: qty 250

## 2020-12-31 MED ORDER — SULFAMETHOXAZOLE-TRIMETHOPRIM 800-160 MG PO TABS: 1.0000 | ORAL_TABLET | Freq: Two times a day (BID) | ORAL | 0 refills | Status: DC

## 2020-12-31 MED ORDER — DIPHENHYDRAMINE HCL 25 MG PO CAPS
25.0000 mg | ORAL_CAPSULE | Freq: Once | ORAL | Status: AC
  Administered 2020-12-31: 25 mg via ORAL
  Filled 2020-12-31: qty 1

## 2020-12-31 NOTE — Patient Instructions (Signed)
Blood Transfusion, Adult, Care After This sheet gives you information about how to care for yourself after your procedure. Your doctor may also give you more specific instructions. If youhave problems or questions, contact your doctor. What can I expect after the procedure? After the procedure, it is common to have: Bruising and soreness at the IV site. A fever or chills on the day of the procedure. This may be your body's response to the new blood cells received. A headache. Follow these instructions at home: Insertion site care     Follow instructions from your doctor about how to take care of your insertion site. This is where an IV tube was put into your vein. Make sure you: Wash your hands with soap and water before and after you change your bandage (dressing). If you cannot use soap and water, use hand sanitizer. Change your bandage as told by your doctor. Check your insertion site every day for signs of infection. Check for: Redness, swelling, or pain. Bleeding from the site. Warmth. Pus or a bad smell. General instructions Take over-the-counter and prescription medicines only as told by your doctor. Rest as told by your doctor. Go back to your normal activities as told by your doctor. Keep all follow-up visits as told by your doctor. This is important. Contact a doctor if: You have itching or red, swollen areas of skin (hives). You feel worried or nervous (anxious). You feel weak after doing your normal activities. You have redness, swelling, warmth, or pain around the insertion site. You have blood coming from the insertion site, and the blood does not stop with pressure. You have pus or a bad smell coming from the insertion site. Get help right away if: You have signs of a serious reaction. This may be coming from an allergy or the body's defense system (immune system). Signs include: Trouble breathing or shortness of breath. Swelling of the face or feeling warm  (flushed). Fever or chills. Head, chest, or back pain. Dark pee (urine) or blood in the pee. Widespread rash. Fast heartbeat. Feeling dizzy or light-headed. You may receive your blood transfusion in an outpatient setting. If so, youwill be told whom to contact to report any reactions. These symptoms may be an emergency. Do not wait to see if the symptoms will go away. Get medical help right away. Call your local emergency services (911 in the [place] Do not drive yourself to the hospital. Summary Bruising and soreness at the IV site are common. Check your insertion site every day for signs of infection. Rest as told by your doctor. Go back to your normal activities as told by your doctor. Get help right away if you have signs of a serious reaction. This information is not intended to replace advice given to you by your health care provider. Make sure you discuss any questions you have with your healthcare provider. Document Revised: 11/14/2018 Document Reviewed: 11/14/2018 Elsevier Patient Education  2022 [employer]  

## 2020-12-31 NOTE — Telephone Encounter (Signed)
Dr Sondra Come instructed patient's wife to hold methotrexate while taking Bactrim.

## 2021-01-01 LAB — TYPE AND SCREEN
ABO/RH(D): A POS
Antibody Screen: NEGATIVE
Unit division: 0
Unit division: 0

## 2021-01-01 LAB — BPAM RBC
Blood Product Expiration Date: 202208152359
Blood Product Expiration Date: 202208152359
ISSUE DATE / TIME: 202207290732
ISSUE DATE / TIME: 202207290732
Unit Type and Rh: 6200
Unit Type and Rh: 6200

## 2021-01-01 LAB — URINE CULTURE: Culture: 20000 — AB

## 2021-01-03 ENCOUNTER — Ambulatory Visit
Admission: RE | Admit: 2021-01-03 | Discharge: 2021-01-03 | Disposition: A | Discharge status: Home / Self Care | Payer: Medicare Other | Source: Ambulatory Visit | Attending: Radiation Oncology | Admitting: Radiation Oncology

## 2021-01-03 ENCOUNTER — Other Ambulatory Visit: Payer: Self-pay

## 2021-01-03 DIAGNOSIS — Z510 Encounter for antineoplastic radiation therapy: Secondary | ICD-10-CM | POA: Insufficient documentation

## 2021-01-03 DIAGNOSIS — C3431 Malignant neoplasm of lower lobe, right bronchus or lung: Secondary | ICD-10-CM | POA: Diagnosis not present

## 2021-01-03 DIAGNOSIS — C3432 Malignant neoplasm of lower lobe, left bronchus or lung: Secondary | ICD-10-CM | POA: Insufficient documentation

## 2021-01-04 ENCOUNTER — Other Ambulatory Visit: Payer: Self-pay | Admitting: Radiation Oncology

## 2021-01-04 ENCOUNTER — Ambulatory Visit
Admission: RE | Admit: 2021-01-04 | Discharge: 2021-01-04 | Disposition: A | Discharge status: Home / Self Care | Payer: Medicare Other | Source: Ambulatory Visit | Attending: Radiation Oncology | Admitting: Radiation Oncology

## 2021-01-04 DIAGNOSIS — C3432 Malignant neoplasm of lower lobe, left bronchus or lung: Secondary | ICD-10-CM | POA: Diagnosis not present

## 2021-01-04 DIAGNOSIS — C3431 Malignant neoplasm of lower lobe, right bronchus or lung: Secondary | ICD-10-CM | POA: Diagnosis not present

## 2021-01-04 DIAGNOSIS — Z510 Encounter for antineoplastic radiation therapy: Secondary | ICD-10-CM | POA: Diagnosis not present

## 2021-01-04 MED ORDER — AMOXICILLIN 500 MG PO TABS: 500.0000 mg | ORAL_TABLET | Freq: Three times a day (TID) | ORAL | 0 refills | Status: DC

## 2021-01-05 ENCOUNTER — Ambulatory Visit
Admission: RE | Admit: 2021-01-05 | Discharge: 2021-01-05 | Disposition: A | Discharge status: Home / Self Care | Payer: Medicare Other | Source: Ambulatory Visit | Attending: Radiation Oncology | Admitting: Radiation Oncology

## 2021-01-05 ENCOUNTER — Other Ambulatory Visit: Payer: Self-pay

## 2021-01-05 DIAGNOSIS — C3432 Malignant neoplasm of lower lobe, left bronchus or lung: Secondary | ICD-10-CM | POA: Diagnosis not present

## 2021-01-05 DIAGNOSIS — C3431 Malignant neoplasm of lower lobe, right bronchus or lung: Secondary | ICD-10-CM | POA: Diagnosis not present

## 2021-01-05 DIAGNOSIS — Z510 Encounter for antineoplastic radiation therapy: Secondary | ICD-10-CM | POA: Diagnosis not present

## 2021-01-06 ENCOUNTER — Ambulatory Visit
Admission: RE | Admit: 2021-01-06 | Discharge: 2021-01-06 | Disposition: A | Discharge status: Home / Self Care | Payer: Medicare Other | Source: Ambulatory Visit | Attending: Radiation Oncology | Admitting: Radiation Oncology

## 2021-01-06 ENCOUNTER — Ambulatory Visit: Payer: Medicare Other

## 2021-01-06 DIAGNOSIS — C3432 Malignant neoplasm of lower lobe, left bronchus or lung: Secondary | ICD-10-CM | POA: Diagnosis not present

## 2021-01-06 DIAGNOSIS — C3431 Malignant neoplasm of lower lobe, right bronchus or lung: Secondary | ICD-10-CM | POA: Diagnosis not present

## 2021-01-06 DIAGNOSIS — Z510 Encounter for antineoplastic radiation therapy: Secondary | ICD-10-CM | POA: Diagnosis not present

## 2021-01-07 ENCOUNTER — Ambulatory Visit
Admission: RE | Admit: 2021-01-07 | Discharge: 2021-01-07 | Disposition: A | Discharge status: Home / Self Care | Payer: Medicare Other | Source: Ambulatory Visit | Attending: Radiation Oncology | Admitting: Radiation Oncology

## 2021-01-07 ENCOUNTER — Other Ambulatory Visit: Payer: Self-pay

## 2021-01-07 ENCOUNTER — Ambulatory Visit: Payer: Medicare Other

## 2021-01-07 DIAGNOSIS — Z510 Encounter for antineoplastic radiation therapy: Secondary | ICD-10-CM | POA: Diagnosis not present

## 2021-01-07 DIAGNOSIS — C3431 Malignant neoplasm of lower lobe, right bronchus or lung: Secondary | ICD-10-CM | POA: Diagnosis not present

## 2021-01-07 DIAGNOSIS — C3432 Malignant neoplasm of lower lobe, left bronchus or lung: Secondary | ICD-10-CM | POA: Diagnosis not present

## 2021-01-10 ENCOUNTER — Other Ambulatory Visit: Payer: Self-pay

## 2021-01-10 ENCOUNTER — Ambulatory Visit
Admission: RE | Admit: 2021-01-10 | Discharge: 2021-01-10 | Disposition: A | Discharge status: Home / Self Care | Payer: Medicare Other | Source: Ambulatory Visit | Attending: Radiation Oncology | Admitting: Radiation Oncology

## 2021-01-10 ENCOUNTER — Ambulatory Visit: Payer: Medicare Other

## 2021-01-10 DIAGNOSIS — J439 Emphysema, unspecified: Secondary | ICD-10-CM | POA: Diagnosis not present

## 2021-01-10 DIAGNOSIS — E1165 Type 2 diabetes mellitus with hyperglycemia: Secondary | ICD-10-CM | POA: Diagnosis not present

## 2021-01-10 DIAGNOSIS — R0602 Shortness of breath: Secondary | ICD-10-CM | POA: Diagnosis not present

## 2021-01-10 DIAGNOSIS — J189 Pneumonia, unspecified organism: Secondary | ICD-10-CM | POA: Diagnosis not present

## 2021-01-10 DIAGNOSIS — R0902 Hypoxemia: Secondary | ICD-10-CM | POA: Diagnosis not present

## 2021-01-10 DIAGNOSIS — C3431 Malignant neoplasm of lower lobe, right bronchus or lung: Secondary | ICD-10-CM | POA: Diagnosis not present

## 2021-01-10 DIAGNOSIS — C3432 Malignant neoplasm of lower lobe, left bronchus or lung: Secondary | ICD-10-CM | POA: Diagnosis not present

## 2021-01-10 DIAGNOSIS — Z510 Encounter for antineoplastic radiation therapy: Secondary | ICD-10-CM | POA: Diagnosis not present

## 2021-01-10 DIAGNOSIS — D509 Iron deficiency anemia, unspecified: Secondary | ICD-10-CM | POA: Diagnosis not present

## 2021-01-10 DIAGNOSIS — N3090 Cystitis, unspecified without hematuria: Secondary | ICD-10-CM | POA: Diagnosis not present

## 2021-01-10 DIAGNOSIS — C7802 Secondary malignant neoplasm of left lung: Secondary | ICD-10-CM | POA: Diagnosis not present

## 2021-01-11 ENCOUNTER — Ambulatory Visit
Admission: RE | Admit: 2021-01-11 | Discharge: 2021-01-11 | Disposition: A | Discharge status: Home / Self Care | Payer: Medicare Other | Source: Ambulatory Visit | Attending: Radiation Oncology | Admitting: Radiation Oncology

## 2021-01-11 DIAGNOSIS — C3431 Malignant neoplasm of lower lobe, right bronchus or lung: Secondary | ICD-10-CM | POA: Diagnosis not present

## 2021-01-11 DIAGNOSIS — C3432 Malignant neoplasm of lower lobe, left bronchus or lung: Secondary | ICD-10-CM | POA: Diagnosis not present

## 2021-01-11 DIAGNOSIS — Z510 Encounter for antineoplastic radiation therapy: Secondary | ICD-10-CM | POA: Diagnosis not present

## 2021-01-12 ENCOUNTER — Other Ambulatory Visit: Payer: Self-pay

## 2021-01-12 ENCOUNTER — Ambulatory Visit
Admission: RE | Admit: 2021-01-12 | Discharge: 2021-01-12 | Disposition: A | Discharge status: Home / Self Care | Payer: Medicare Other | Source: Ambulatory Visit | Attending: Radiation Oncology | Admitting: Radiation Oncology

## 2021-01-12 DIAGNOSIS — C3431 Malignant neoplasm of lower lobe, right bronchus or lung: Secondary | ICD-10-CM | POA: Diagnosis not present

## 2021-01-12 DIAGNOSIS — Z510 Encounter for antineoplastic radiation therapy: Secondary | ICD-10-CM | POA: Diagnosis not present

## 2021-01-12 DIAGNOSIS — C3432 Malignant neoplasm of lower lobe, left bronchus or lung: Secondary | ICD-10-CM | POA: Diagnosis not present

## 2021-01-13 ENCOUNTER — Ambulatory Visit: Payer: Medicare Other

## 2021-01-13 DIAGNOSIS — H04123 Dry eye syndrome of bilateral lacrimal glands: Secondary | ICD-10-CM | POA: Diagnosis not present

## 2021-01-13 DIAGNOSIS — H35371 Puckering of macula, right eye: Secondary | ICD-10-CM | POA: Diagnosis not present

## 2021-01-13 DIAGNOSIS — Z9889 Other specified postprocedural states: Secondary | ICD-10-CM | POA: Diagnosis not present

## 2021-01-13 DIAGNOSIS — H44002 Unspecified purulent endophthalmitis, left eye: Secondary | ICD-10-CM | POA: Diagnosis not present

## 2021-01-14 ENCOUNTER — Ambulatory Visit
Admission: RE | Admit: 2021-01-14 | Discharge: 2021-01-14 | Disposition: A | Discharge status: Home / Self Care | Payer: Medicare Other | Source: Ambulatory Visit | Attending: Radiation Oncology | Admitting: Radiation Oncology

## 2021-01-14 ENCOUNTER — Ambulatory Visit: Payer: Medicare Other

## 2021-01-14 DIAGNOSIS — C3432 Malignant neoplasm of lower lobe, left bronchus or lung: Secondary | ICD-10-CM | POA: Diagnosis not present

## 2021-01-14 DIAGNOSIS — Z510 Encounter for antineoplastic radiation therapy: Secondary | ICD-10-CM | POA: Diagnosis not present

## 2021-01-14 DIAGNOSIS — C3431 Malignant neoplasm of lower lobe, right bronchus or lung: Secondary | ICD-10-CM | POA: Diagnosis not present

## 2021-01-17 ENCOUNTER — Other Ambulatory Visit: Payer: Self-pay

## 2021-01-17 ENCOUNTER — Encounter: Payer: Medicare Other | Admitting: Nutrition

## 2021-01-17 ENCOUNTER — Ambulatory Visit: Payer: Medicare Other

## 2021-01-17 ENCOUNTER — Ambulatory Visit
Admission: RE | Admit: 2021-01-17 | Discharge: 2021-01-17 | Disposition: A | Discharge status: Home / Self Care | Payer: Medicare Other | Source: Ambulatory Visit | Attending: Radiation Oncology | Admitting: Radiation Oncology

## 2021-01-17 ENCOUNTER — Encounter: Payer: Self-pay | Admitting: Nutrition

## 2021-01-17 DIAGNOSIS — C3431 Malignant neoplasm of lower lobe, right bronchus or lung: Secondary | ICD-10-CM | POA: Diagnosis not present

## 2021-01-17 DIAGNOSIS — C3432 Malignant neoplasm of lower lobe, left bronchus or lung: Secondary | ICD-10-CM | POA: Diagnosis not present

## 2021-01-17 DIAGNOSIS — Z510 Encounter for antineoplastic radiation therapy: Secondary | ICD-10-CM | POA: Diagnosis not present

## 2021-01-17 DIAGNOSIS — R300 Dysuria: Secondary | ICD-10-CM | POA: Diagnosis not present

## 2021-01-17 NOTE — Progress Notes (Signed)
Patient did not show up for nutrition appointment. 

## 2021-01-18 ENCOUNTER — Ambulatory Visit: Payer: Medicare Other

## 2021-01-18 ENCOUNTER — Ambulatory Visit
Admission: RE | Admit: 2021-01-18 | Discharge: 2021-01-18 | Disposition: A | Discharge status: Home / Self Care | Payer: Medicare Other | Source: Ambulatory Visit | Attending: Radiation Oncology | Admitting: Radiation Oncology

## 2021-01-18 DIAGNOSIS — C3431 Malignant neoplasm of lower lobe, right bronchus or lung: Secondary | ICD-10-CM | POA: Diagnosis not present

## 2021-01-18 DIAGNOSIS — C3432 Malignant neoplasm of lower lobe, left bronchus or lung: Secondary | ICD-10-CM | POA: Diagnosis not present

## 2021-01-18 DIAGNOSIS — Z510 Encounter for antineoplastic radiation therapy: Secondary | ICD-10-CM | POA: Diagnosis not present

## 2021-01-19 ENCOUNTER — Ambulatory Visit
Admission: RE | Admit: 2021-01-19 | Discharge: 2021-01-19 | Disposition: A | Discharge status: Home / Self Care | Payer: Medicare Other | Source: Ambulatory Visit | Attending: Radiation Oncology | Admitting: Radiation Oncology

## 2021-01-19 ENCOUNTER — Other Ambulatory Visit: Payer: Self-pay

## 2021-01-19 DIAGNOSIS — C3432 Malignant neoplasm of lower lobe, left bronchus or lung: Secondary | ICD-10-CM | POA: Diagnosis not present

## 2021-01-19 DIAGNOSIS — C3431 Malignant neoplasm of lower lobe, right bronchus or lung: Secondary | ICD-10-CM | POA: Diagnosis not present

## 2021-01-19 DIAGNOSIS — Z510 Encounter for antineoplastic radiation therapy: Secondary | ICD-10-CM | POA: Diagnosis not present

## 2021-01-20 ENCOUNTER — Ambulatory Visit
Admission: RE | Admit: 2021-01-20 | Discharge: 2021-01-20 | Disposition: A | Discharge status: Home / Self Care | Payer: Medicare Other | Source: Ambulatory Visit | Attending: Radiation Oncology | Admitting: Radiation Oncology

## 2021-01-20 DIAGNOSIS — C3431 Malignant neoplasm of lower lobe, right bronchus or lung: Secondary | ICD-10-CM | POA: Diagnosis not present

## 2021-01-20 DIAGNOSIS — C3432 Malignant neoplasm of lower lobe, left bronchus or lung: Secondary | ICD-10-CM | POA: Diagnosis not present

## 2021-01-20 DIAGNOSIS — Z510 Encounter for antineoplastic radiation therapy: Secondary | ICD-10-CM | POA: Diagnosis not present

## 2021-01-21 ENCOUNTER — Other Ambulatory Visit: Payer: Self-pay

## 2021-01-21 ENCOUNTER — Ambulatory Visit
Admission: RE | Admit: 2021-01-21 | Discharge: 2021-01-21 | Disposition: A | Discharge status: Home / Self Care | Payer: Medicare Other | Source: Ambulatory Visit | Attending: Radiation Oncology | Admitting: Radiation Oncology

## 2021-01-21 DIAGNOSIS — C3432 Malignant neoplasm of lower lobe, left bronchus or lung: Secondary | ICD-10-CM | POA: Diagnosis not present

## 2021-01-21 DIAGNOSIS — C3431 Malignant neoplasm of lower lobe, right bronchus or lung: Secondary | ICD-10-CM | POA: Diagnosis not present

## 2021-01-21 DIAGNOSIS — Z510 Encounter for antineoplastic radiation therapy: Secondary | ICD-10-CM | POA: Diagnosis not present

## 2021-01-24 ENCOUNTER — Ambulatory Visit
Admission: RE | Admit: 2021-01-24 | Discharge: 2021-01-24 | Disposition: A | Discharge status: Home / Self Care | Payer: Medicare Other | Source: Ambulatory Visit | Attending: Radiation Oncology | Admitting: Radiation Oncology

## 2021-01-24 DIAGNOSIS — Z510 Encounter for antineoplastic radiation therapy: Secondary | ICD-10-CM | POA: Diagnosis not present

## 2021-01-24 DIAGNOSIS — C3431 Malignant neoplasm of lower lobe, right bronchus or lung: Secondary | ICD-10-CM | POA: Diagnosis not present

## 2021-01-24 DIAGNOSIS — C3432 Malignant neoplasm of lower lobe, left bronchus or lung: Secondary | ICD-10-CM | POA: Diagnosis not present

## 2021-01-25 ENCOUNTER — Ambulatory Visit
Admission: RE | Admit: 2021-01-25 | Discharge: 2021-01-25 | Disposition: A | Discharge status: Home / Self Care | Payer: Medicare Other | Source: Ambulatory Visit | Attending: Radiation Oncology | Admitting: Radiation Oncology

## 2021-01-25 ENCOUNTER — Other Ambulatory Visit: Payer: Self-pay

## 2021-01-25 DIAGNOSIS — C3431 Malignant neoplasm of lower lobe, right bronchus or lung: Secondary | ICD-10-CM | POA: Diagnosis not present

## 2021-01-25 DIAGNOSIS — C3432 Malignant neoplasm of lower lobe, left bronchus or lung: Secondary | ICD-10-CM | POA: Diagnosis not present

## 2021-01-25 DIAGNOSIS — Z510 Encounter for antineoplastic radiation therapy: Secondary | ICD-10-CM | POA: Diagnosis not present

## 2021-01-26 ENCOUNTER — Ambulatory Visit
Admission: RE | Admit: 2021-01-26 | Discharge: 2021-01-26 | Disposition: A | Discharge status: Home / Self Care | Payer: Medicare Other | Source: Ambulatory Visit | Attending: Radiation Oncology | Admitting: Radiation Oncology

## 2021-01-26 DIAGNOSIS — Z510 Encounter for antineoplastic radiation therapy: Secondary | ICD-10-CM | POA: Diagnosis not present

## 2021-01-26 DIAGNOSIS — C3432 Malignant neoplasm of lower lobe, left bronchus or lung: Secondary | ICD-10-CM | POA: Diagnosis not present

## 2021-01-26 DIAGNOSIS — C3431 Malignant neoplasm of lower lobe, right bronchus or lung: Secondary | ICD-10-CM | POA: Diagnosis not present

## 2021-01-27 ENCOUNTER — Ambulatory Visit
Admission: RE | Admit: 2021-01-27 | Discharge: 2021-01-27 | Disposition: A | Discharge status: Home / Self Care | Payer: Medicare Other | Source: Ambulatory Visit | Attending: Radiation Oncology | Admitting: Radiation Oncology

## 2021-01-27 DIAGNOSIS — C3432 Malignant neoplasm of lower lobe, left bronchus or lung: Secondary | ICD-10-CM | POA: Diagnosis not present

## 2021-01-27 DIAGNOSIS — Z510 Encounter for antineoplastic radiation therapy: Secondary | ICD-10-CM | POA: Diagnosis not present

## 2021-01-27 DIAGNOSIS — C3431 Malignant neoplasm of lower lobe, right bronchus or lung: Secondary | ICD-10-CM | POA: Diagnosis not present

## 2021-01-28 ENCOUNTER — Telehealth: Payer: Self-pay | Admitting: Physician Assistant

## 2021-01-28 NOTE — Telephone Encounter (Signed)
R/s appt per 8/25 sch msg. Pt aware.

## 2021-02-01 ENCOUNTER — Other Ambulatory Visit: Payer: Self-pay | Admitting: Physician Assistant

## 2021-02-01 ENCOUNTER — Inpatient Hospital Stay: Payer: Medicare Other

## 2021-02-01 ENCOUNTER — Encounter (HOSPITAL_COMMUNITY): Payer: Self-pay

## 2021-02-01 ENCOUNTER — Other Ambulatory Visit: Payer: Self-pay

## 2021-02-01 ENCOUNTER — Inpatient Hospital Stay (HOSPITAL_COMMUNITY)
Admission: RE | Admit: 2021-02-01 | Discharge: 2021-02-01 | Disposition: A | Discharge status: Home / Self Care | Payer: Medicare Other | Source: Ambulatory Visit | Attending: Physician Assistant | Admitting: Physician Assistant

## 2021-02-01 DIAGNOSIS — C3432 Malignant neoplasm of lower lobe, left bronchus or lung: Secondary | ICD-10-CM | POA: Insufficient documentation

## 2021-02-01 DIAGNOSIS — I700 Atherosclerosis of aorta: Secondary | ICD-10-CM | POA: Diagnosis not present

## 2021-02-01 DIAGNOSIS — C799 Secondary malignant neoplasm of unspecified site: Secondary | ICD-10-CM | POA: Diagnosis not present

## 2021-02-01 DIAGNOSIS — D649 Anemia, unspecified: Secondary | ICD-10-CM | POA: Diagnosis not present

## 2021-02-01 DIAGNOSIS — R911 Solitary pulmonary nodule: Secondary | ICD-10-CM | POA: Diagnosis not present

## 2021-02-01 DIAGNOSIS — C3491 Malignant neoplasm of unspecified part of right bronchus or lung: Secondary | ICD-10-CM

## 2021-02-01 DIAGNOSIS — C3490 Malignant neoplasm of unspecified part of unspecified bronchus or lung: Secondary | ICD-10-CM | POA: Diagnosis not present

## 2021-02-01 DIAGNOSIS — J189 Pneumonia, unspecified organism: Secondary | ICD-10-CM | POA: Diagnosis not present

## 2021-02-01 DIAGNOSIS — I9589 Other hypotension: Secondary | ICD-10-CM | POA: Diagnosis not present

## 2021-02-01 DIAGNOSIS — A419 Sepsis, unspecified organism: Secondary | ICD-10-CM | POA: Diagnosis not present

## 2021-02-01 DIAGNOSIS — R918 Other nonspecific abnormal finding of lung field: Secondary | ICD-10-CM | POA: Diagnosis not present

## 2021-02-01 DIAGNOSIS — J90 Pleural effusion, not elsewhere classified: Secondary | ICD-10-CM | POA: Diagnosis not present

## 2021-02-01 DIAGNOSIS — Z20822 Contact with and (suspected) exposure to covid-19: Secondary | ICD-10-CM | POA: Diagnosis not present

## 2021-02-01 DIAGNOSIS — Z66 Do not resuscitate: Secondary | ICD-10-CM | POA: Diagnosis not present

## 2021-02-01 LAB — CMP (CANCER CENTER ONLY)
ALT: 19 U/L (ref 0–44)
AST: 16 U/L (ref 15–41)
Albumin: 2 g/dL — ABNORMAL LOW (ref 3.5–5.0)
Alkaline Phosphatase: 71 U/L (ref 38–126)
Anion gap: 11 (ref 5–15)
BUN: 20 mg/dL (ref 8–23)
CO2: 27 mmol/L (ref 22–32)
Calcium: 9.6 mg/dL (ref 8.9–10.3)
Chloride: 101 mmol/L (ref 98–111)
Creatinine: 0.83 mg/dL (ref 0.61–1.24)
GFR, Estimated: >60 mL/min (ref >60)
Glucose, Bld: 135 mg/dL — ABNORMAL HIGH (ref 70–99)
Potassium: 4.4 mmol/L (ref 3.5–5.1)
Sodium: 139 mmol/L (ref 135–145)
Total Bilirubin: 0.3 mg/dL (ref 0.3–1.2)
Total Protein: 7.9 g/dL (ref 6.5–8.1)

## 2021-02-01 LAB — CBC WITH DIFFERENTIAL (CANCER CENTER ONLY)
Abs Immature Granulocytes: 0.16 10*3/uL — ABNORMAL HIGH (ref 0.00–0.07)
Basophils Absolute: 0 10*3/uL (ref 0.0–0.1)
Basophils Relative: 0 %
Eosinophils Absolute: 0.3 10*3/uL (ref 0.0–0.5)
Eosinophils Relative: 2 %
HCT: 24 % — ABNORMAL LOW (ref 39.0–52.0)
Hemoglobin: 7.3 g/dL — ABNORMAL LOW (ref 13.0–17.0)
Immature Granulocytes: 1 %
Lymphocytes Relative: 2 %
Lymphs Abs: 0.3 10*3/uL — ABNORMAL LOW (ref 0.7–4.0)
MCH: 30.3 pg (ref 26.0–34.0)
MCHC: 30.4 g/dL (ref 30.0–36.0)
MCV: 99.6 fL (ref 80.0–100.0)
Monocytes Absolute: 0.4 10*3/uL (ref 0.1–1.0)
Monocytes Relative: 2 %
Neutro Abs: 15.7 10*3/uL — ABNORMAL HIGH (ref 1.7–7.7)
Neutrophils Relative %: 93 %
Platelet Count: 271 10*3/uL (ref 150–400)
RBC: 2.41 MIL/uL — ABNORMAL LOW (ref 4.22–5.81)
RDW: 17 % — ABNORMAL HIGH (ref 11.5–15.5)
WBC Count: 16.8 10*3/uL — ABNORMAL HIGH (ref 4.0–10.5)
nRBC: 0 % (ref 0.0–0.2)

## 2021-02-01 MED ORDER — IOHEXOL 350 MG/ML SOLN
80.0000 mL | Freq: Once | INTRAVENOUS | Status: AC | PRN
  Administered 2021-02-01: 80 mL via INTRAVENOUS

## 2021-02-01 NOTE — Progress Notes (Deleted)
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 He was found to have squamous cell carcinoma in the left lower lobe in May 2022.  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 Diagnosed in May 2022.    PD-L1 expression 20%  PRIOR THERAPY: 1) Palliative radiotherapy under the care of Dr. Sondra Come to the obstructive right lower lobe lung mass. 2) Systemic chemotherapy with carboplatin for AUC of 5, paclitaxel 175 mg/M2 and Keytruda 200 mg IV every 3 weeks with Neulasta support.  First dose September 02, 2019.  Status post 6 cycles.  His treatment is currently on hold. Last dose 01/21/20. Immunotherapy discontinued after  3) palliative radiation to the new left lower lobe lung mass under the care of Dr. Sondra Come.  Last dose completed on 01/27/2021.  CURRENT THERAPY: ***  INTERVAL HISTORY: Jeremy [name] 75 y.o. male returns to clinic today for follow-up visit.  The patient was last seen in the clinic on 11/04/2020.  At that time, the patient was found to have a new left lower lobe lung mass.  The patient underwent biopsy which was consistent with malignancy.  Due to the patient being a poor candidate for chemotherapy, he was referred to Dr. Sondra Come from radiation oncology for treatment.  He completed radiation on 01/27/2021.  Per chart review it appears that the patient was hospitalized from 11/30/2020 to 12/08/2020 for the chief complaint of fatigue, weakness, and fever.  The patient has required several blood transfusions since his last appointment despite not being on chemotherapy.  He is being set up for 2 units of blood tomorrow at droppage.  The patient denies any abnormal bleeding or bruising to his  knowledge.  The patient is feeling symptomatic from his anemia and reports fatigue.  He denies any recent fevers, or chills.  He reports baseline night sweats and dyspnea on exertion.  Cough produces brownish sputum.  The patient denies any chest pain or hemoptysis.  He denies any nausea, vomiting, diarrhea, or constipation.  He denies any headache or visual changes.  The patient recently had a restaging CT scan performed.  He is here today for evaluation to review his scan results.   MEDICAL HISTORY: Past Medical History:  Diagnosis Date   Anemia    Cancer ([place])    mass right lung   COPD (chronic obstructive pulmonary disease) ([place])    QUIT SMOKING 30 YRS AGO   Diabetes ([place]) 2018   Dysrhythmia    PAF   Empyema ([place])    History of radiation therapy 08/11/2019 through 08/28/2019   right lung        Dr Gery Pray   HTN (hypertension)    Hyperlipidemia    Neuromuscular disorder ([place])    PONV (postoperative nausea and vomiting)    Psoriatic arthritis ([place]) 2015   Sepsis ([place])    possibly from right port and traveled to eye    ALLERGIES:  is allergic to naproxen sodium, pembrolizumab, chlorhexidine, and nsaids.  MEDICATIONS:  No current facility-administered medications for this visit.   Current Outpatient Medications  Medication Sig Dispense Refill   acetaminophen (TYLENOL) 325 MG tablet Take 2 tablets (650 mg total) by mouth every 6 (six) hours as needed for mild  pain, fever or headache. 120 tablet 0   albuterol (VENTOLIN HFA) 108 (90 Base) MCG/ACT inhaler Inhale 2 puffs into the lungs every 6 (six) hours as needed for wheezing or shortness of breath.     bisacodyl (DULCOLAX) 5 MG EC tablet Take 10 mg by mouth daily as needed for moderate constipation.     cyclobenzaprine (FLEXERIL) 10 MG tablet Take 10 mg by mouth at bedtime as needed for muscle spasms.     docusate sodium (COLACE) 100 MG capsule Take 100 mg by mouth at bedtime.     ferrous sulfate 325 (65 FE) MG tablet Take 325  mg by mouth daily with supper.     folic acid (FOLVITE) 1 MG tablet Take 1 mg by mouth every evening.     gabapentin (NEURONTIN) 300 MG capsule Take 600 mg by mouth at bedtime as needed (for neuropathy).     meclizine (ANTIVERT) 25 MG tablet Take 25 mg by mouth 3 (three) times daily as needed for dizziness.     metFORMIN (GLUCOPHAGE-XR) 500 MG 24 hr tablet Take 500-1,000 mg by mouth See admin instructions. Take 1,000 mg by mouth in the morning and 500 mg at bedtime     methotrexate (RHEUMATREX) 2.5 MG tablet Take 10 mg by mouth every Sunday. Caution:Chemotherapy. Protect from light.     metoprolol tartrate (LOPRESSOR) 50 MG tablet Take 1 tablet (50 mg total) by mouth 2 (two) times daily. 180 tablet 3   Multiple Vitamins-Minerals (CENTRUM ADULTS PO) Take 1 tablet by mouth daily at 6 (six) AM.     Omega-3 Fatty Acids (FISH OIL) 1000 MG CAPS Take 1,000 mg by mouth daily.      oxyCODONE-acetaminophen (PERCOCET) 10-325 MG tablet Take 1 tablet by mouth every 6 (six) hours as needed for pain.     tamsulosin (FLOMAX) 0.4 MG CAPS capsule Take 0.4 mg by mouth daily.     traMADol (ULTRAM) 50 MG tablet Take 50 mg by mouth every 4 (four) hours as needed for moderate pain.     Facility-Administered Medications Ordered in Other Visits  Medication Dose Route Frequency Provider Last Rate Last Admin   0.9 %  sodium chloride infusion   Intravenous Continuous Eppie Gibson, MD       0.9 %  sodium chloride infusion  10 mL/hr Intravenous Once Sponseller, Rebekah R, PA-C       ceFEPIme (MAXIPIME) 2 g in sodium chloride 0.9 % 100 mL IVPB  2 g Intravenous Once Sponseller, Rebekah R, PA-C       lactated ringers bolus 1,000 mL  1,000 mL Intravenous Once Sponseller, Rebekah R, PA-C       And   lactated ringers bolus 250 mL  250 mL Intravenous Once Sponseller, Rebekah R, PA-C       lactated ringers infusion   Intravenous Continuous Sponseller, Gypsy Balsam, PA-C        SURGICAL HISTORY:  Past Surgical History:  Procedure  Laterality Date   [place]   BIOPSY  12/30/2018   Procedure: BIOPSY;  Surgeon: Danie Binder, MD;  [facility];  Service: Endoscopy;;  gastric   CATARACT EXTRACTION Bilateral    CERVICAL SPINE SURGERY  1995   COLONOSCOPY WITH PROPOFOL N/A 12/30/2018   Procedure: COLONOSCOPY WITH PROPOFOL;  Surgeon: Danie Binder, MD;  [facility];  Service: Endoscopy;  Laterality: N/A;  12:30pm   ESOPHAGOGASTRODUODENOSCOPY (EGD) WITH PROPOFOL N/A 12/30/2018   Procedure: ESOPHAGOGASTRODUODENOSCOPY (EGD) WITH PROPOFOL;  Surgeon: Danie Binder, MD;  [facility];  Service: Endoscopy;  Laterality: N/A;   EYE SURGERY     detached retina   IR IMAGING GUIDED PORT INSERTION  09/04/2019   LUMBAR LAMINECTOMY/ DECOMPRESSION WITH MET-RX Right 08/30/2020   Procedure: Right Lumbar three-four Microdiscectomy;  Surgeon: Kristeen Miss, MD;  Location: [facility];  Service: Neurosurgery;  Laterality: Right;   POLYPECTOMY  12/30/2018   Procedure: POLYPECTOMY;  Surgeon: Danie Binder, MD;  [facility];  Service: Endoscopy;;  colon   THORACENTESIS Right 08/08/2019   Procedure: Thoracentesis;  Surgeon: [name], MD;  Location: [facility];  Service: Pulmonary;  Laterality: Right;   VIDEO BRONCHOSCOPY Right 08/08/2019   Procedure: Video Bronchoscopy with Erbe Cryo Biopsy of Right mainstem;  Surgeon: [name], MD;  [facility];  Service: Pulmonary;  Laterality: Right;    REVIEW OF SYSTEMS:   Review of Systems  Constitutional: Negative for appetite change, chills, fatigue, fever and unexpected weight change.  HENT:   Negative for mouth sores, nosebleeds, sore throat and trouble swallowing.   Eyes: Negative for eye problems and icterus.  Respiratory: Negative for cough, hemoptysis, shortness of breath and wheezing.   Cardiovascular: Negative for chest pain and leg swelling.  Gastrointestinal: Negative for abdominal pain, constipation, diarrhea, nausea and vomiting.   Genitourinary: Negative for bladder incontinence, difficulty urinating, dysuria, frequency and hematuria.   Musculoskeletal: Negative for back pain, gait problem, neck pain and neck stiffness.  Skin: Negative for itching and rash.  Neurological: Negative for dizziness, extremity weakness, gait problem, headaches, light-headedness and seizures.  Hematological: Negative for adenopathy. Does not bruise/bleed easily.  Psychiatric/Behavioral: Negative for confusion, depression and sleep disturbance. The patient is not nervous/anxious.     PHYSICAL EXAMINATION:  There were no vitals taken for this visit.  ECOG PERFORMANCE STATUS: {CHL ONC ECOG [id number]  Physical Exam  Constitutional: Oriented to person, place, and time and well-developed, well-nourished, and in no distress. No distress.  HENT:  Head: Normocephalic and atraumatic.  Mouth/Throat: Oropharynx is clear and moist. No oropharyngeal exudate.  Eyes: Conjunctivae are normal. Right eye exhibits no discharge. Left eye exhibits no discharge. No scleral icterus.  Neck: Normal range of motion. Neck supple.  Cardiovascular: Normal rate, regular rhythm, normal heart sounds and intact distal pulses.   Pulmonary/Chest: Effort normal and breath sounds normal. No respiratory distress. No wheezes. No rales.  Abdominal: Soft. Bowel sounds are normal. Exhibits no distension and no mass. There is no tenderness.  Musculoskeletal: Normal range of motion. Exhibits no edema.  Lymphadenopathy:    No cervical adenopathy.  Neurological: Alert and oriented to person, place, and time. Exhibits normal muscle tone. Gait normal. Coordination normal.  Skin: Skin is warm and dry. No rash noted. Not diaphoretic. No erythema. No pallor.  Psychiatric: Mood, memory and judgment normal.  Vitals reviewed.  LABORATORY DATA: Lab Results  Component Value Date   WBC 16.4 (H) 02/02/2021   HGB 5.8 (LL) 02/02/2021   HCT 19.6 (L) 02/02/2021   MCV 103.7 (H)  02/02/2021   PLT 263 02/02/2021      Chemistry      Component Value Date/Time   NA 136 02/02/2021 1307   K 4.5 02/02/2021 1307   CL 101 02/02/2021 1307   CO2 27 02/02/2021 1307   BUN 22 02/02/2021 1307   CREATININE 0.93 02/02/2021 1307   CREATININE 0.83 02/01/2021 1028      Component Value Date/Time   CALCIUM 8.2 (L)  02/02/2021 1307   ALKPHOS 57 02/02/2021 1307   AST 16 02/02/2021 1307   AST 16 02/01/2021 1028   ALT 19 02/02/2021 1307   ALT 19 02/01/2021 1028   BILITOT 0.4 02/02/2021 1307   BILITOT 0.3 02/01/2021 1028       RADIOGRAPHIC STUDIES:  CT Chest W Contrast  Result Date: 02/02/2021 CLINICAL DATA:  Primary Cancer Type: Lung Imaging Indication: Assess response to therapy Interval therapy since last imaging? Yes Initial Cancer Diagnosis Date: 08/08/2019; Established by: Biopsy-proven Detailed Pathology: Stage IIIC/IV non-small cell lung cancer, squamous cell carcinoma. Primary Tumor location: Initial right lower lobe. New/enlarging left lower lobe lesion. Surgeries: Microdiscectomy right L3-4 08/30/2020. Chemotherapy: Yes; Ongoing? No; Most recent administration: 01/21/2020 Immunotherapy?  Yes; Type: Keytruda; Ongoing? No Radiation therapy? Yes Date Range: 11/23/2020 - 01/27/2021; Target: Left lung Date Range: 08/11/2019 - 08/28/2019; Target: Right lung EXAM: CT CHEST WITH CONTRAST TECHNIQUE: Multidetector CT imaging of the chest was performed during intravenous contrast administration. CONTRAST:  52m OMNIPAQUE IOHEXOL 350 MG/ML SOLN COMPARISON:  CT chest PE study, 12/01/2020, 10/15/2020 PET-CT. CT chest, abdomen and pelvis 10/04/2020. FINDINGS: CT CHEST FINDINGS Cardiovascular: Aortic atherosclerosis. Normal heart size. Left coronary artery calcifications. No pericardial effusion. Mediastinum/Nodes: No enlarged mediastinal, hilar, or axillary lymph nodes. Thyroid gland, trachea, and esophagus demonstrate no significant findings. Lungs/Pleura: Unchanged moderate, loculated  right pleural effusion with extensive associated pleural thickening. Redemonstrated post treatment fibrosis and consolidation of the perihilar right lung and right lower lobe. There has been a very substantial increase in dense consolidation and volume loss of the right lung, particular in the posterior right upper lobe (series 5, image 58). There is a corresponding increase in degree of obstruction of the right lobar bronchi. Multiple new nodules in the aerated portion of the lung, for example a 1.7 x 1.0 cm nodule of the peripheral right upper lobe (series 5, image 83). There is interlobular septal thickening throughout the aerated portions the right lung. Redemonstrated cavitary lesion of the superior segment left lower lobe, which is significantly diminished in size and with a greater volume of internal air, measuring approximately 4.3 x 3.8 cm, previously 7.0 x 5.1 cm when measured similarly (series 5, image 85). There is bronchial wall thickening and plugging in this vicinity. There are new, clustered nodules in the dependent left lower lobe inferior to this lesion (series 5, image 114) and a new subpleural nodule of the superior segment left lower lobe measuring 1.2 x 0.9 cm (series 5, image 49). An irregular nodule of the peripheral left upper lobe with associated pleural tenting is unchanged, measuring 0.9 x 0.7 cm (series 5, image 55). Musculoskeletal: No chest wall mass or suspicious bone lesions identified. CT ABDOMEN PELVIS FINDINGS Hepatobiliary: No solid liver abnormality is seen. No gallstones, gallbladder wall thickening, or biliary dilatation. Pancreas: Unremarkable. No pancreatic ductal dilatation or surrounding inflammatory changes. Spleen: Normal in size without significant abnormality. Adrenals/Urinary Tract: Adrenal glands are unremarkable. Kidneys are normal, without renal calculi, solid lesion, or hydronephrosis. Bladder is unremarkable. Stomach/Bowel: Stomach is within normal limits. Small  incidental diverticulum of the transverse portion of the duodenum. Appendix appears normal. No evidence of bowel wall thickening, distention, or inflammatory changes. Descending and sigmoid diverticulosis. Large burden of stool throughout the colon rectum. Vascular/Lymphatic: Aortic atherosclerosis. No enlarged abdominal or pelvic lymph nodes. Reproductive: No mass or other abnormality. Other: No abdominal wall hernia or abnormality. No abdominopelvic ascites. Musculoskeletal: No acute or significant osseous findings. IMPRESSION: 1. Redemonstrated post treatment fibrosis and consolidation  of the perihilar right lung and right lower lobe. There has been a very substantial increase in dense consolidation and volume loss of the right lung, particularly in the posterior right upper lobe. There is a corresponding increase in degree of obstruction of the right lobar bronchi. Interlobular septal thickening throughout the aerated portions of the right lung. Findings are consistent with locally worsened perihilar right lung malignancy with significant postobstructive airspace disease as well as pleural and lymphangitic involvement. 2. Multiple new bilateral pulmonary nodules, most consistent with pulmonary parenchymal metastatic disease. 3. Redemonstrated cavitary lesion of the superior segment left lower lobe, which is significantly diminished in size and with a greater volume of internal air, consistent with treatment response. 4. Overall constellation of findings is consistent with mixed response to treatment. 5. An irregular nodule of the peripheral left upper lobe with associated pleural tenting is unchanged, nonspecific. Attention on follow-up. 6. No evidence of distant metastatic disease in the abdomen pelvis. Aortic Atherosclerosis (ICD10-I70.0). Electronically Signed   By: Eddie Candle M.D.   On: 02/02/2021 10:01   CT Abdomen Pelvis W Contrast  Result Date: 02/02/2021 CLINICAL DATA:  Primary Cancer Type: Lung  Imaging Indication: Assess response to therapy Interval therapy since last imaging? Yes Initial Cancer Diagnosis Date: 08/08/2019; Established by: Biopsy-proven Detailed Pathology: Stage IIIC/IV non-small cell lung cancer, squamous cell carcinoma. Primary Tumor location: Initial right lower lobe. New/enlarging left lower lobe lesion. Surgeries: Microdiscectomy right L3-4 08/30/2020. Chemotherapy: Yes; Ongoing? No; Most recent administration: 01/21/2020 Immunotherapy?  Yes; Type: Keytruda; Ongoing? No Radiation therapy? Yes Date Range: 11/23/2020 - 01/27/2021; Target: Left lung Date Range: 08/11/2019 - 08/28/2019; Target: Right lung EXAM: CT CHEST WITH CONTRAST TECHNIQUE: Multidetector CT imaging of the chest was performed during intravenous contrast administration. CONTRAST:  41m OMNIPAQUE IOHEXOL 350 MG/ML SOLN COMPARISON:  CT chest PE study, 12/01/2020, 10/15/2020 PET-CT. CT chest, abdomen and pelvis 10/04/2020. FINDINGS: CT CHEST FINDINGS Cardiovascular: Aortic atherosclerosis. Normal heart size. Left coronary artery calcifications. No pericardial effusion. Mediastinum/Nodes: No enlarged mediastinal, hilar, or axillary lymph nodes. Thyroid gland, trachea, and esophagus demonstrate no significant findings. Lungs/Pleura: Unchanged moderate, loculated right pleural effusion with extensive associated pleural thickening. Redemonstrated post treatment fibrosis and consolidation of the perihilar right lung and right lower lobe. There has been a very substantial increase in dense consolidation and volume loss of the right lung, particular in the posterior right upper lobe (series 5, image 58). There is a corresponding increase in degree of obstruction of the right lobar bronchi. Multiple new nodules in the aerated portion of the lung, for example a 1.7 x 1.0 cm nodule of the peripheral right upper lobe (series 5, image 83). There is interlobular septal thickening throughout the aerated portions the right lung.  Redemonstrated cavitary lesion of the superior segment left lower lobe, which is significantly diminished in size and with a greater volume of internal air, measuring approximately 4.3 x 3.8 cm, previously 7.0 x 5.1 cm when measured similarly (series 5, image 85). There is bronchial wall thickening and plugging in this vicinity. There are new, clustered nodules in the dependent left lower lobe inferior to this lesion (series 5, image 114) and a new subpleural nodule of the superior segment left lower lobe measuring 1.2 x 0.9 cm (series 5, image 49). An irregular nodule of the peripheral left upper lobe with associated pleural tenting is unchanged, measuring 0.9 x 0.7 cm (series 5, image 55). Musculoskeletal: No chest wall mass or suspicious bone lesions identified. CT ABDOMEN PELVIS FINDINGS Hepatobiliary:  No solid liver abnormality is seen. No gallstones, gallbladder wall thickening, or biliary dilatation. Pancreas: Unremarkable. No pancreatic ductal dilatation or surrounding inflammatory changes. Spleen: Normal in size without significant abnormality. Adrenals/Urinary Tract: Adrenal glands are unremarkable. Kidneys are normal, without renal calculi, solid lesion, or hydronephrosis. Bladder is unremarkable. Stomach/Bowel: Stomach is within normal limits. Small incidental diverticulum of the transverse portion of the duodenum. Appendix appears normal. No evidence of bowel wall thickening, distention, or inflammatory changes. Descending and sigmoid diverticulosis. Large burden of stool throughout the colon rectum. Vascular/Lymphatic: Aortic atherosclerosis. No enlarged abdominal or pelvic lymph nodes. Reproductive: No mass or other abnormality. Other: No abdominal wall hernia or abnormality. No abdominopelvic ascites. Musculoskeletal: No acute or significant osseous findings. IMPRESSION: 1. Redemonstrated post treatment fibrosis and consolidation of the perihilar right lung and right lower lobe. There has been a very  substantial increase in dense consolidation and volume loss of the right lung, particularly in the posterior right upper lobe. There is a corresponding increase in degree of obstruction of the right lobar bronchi. Interlobular septal thickening throughout the aerated portions of the right lung. Findings are consistent with locally worsened perihilar right lung malignancy with significant postobstructive airspace disease as well as pleural and lymphangitic involvement. 2. Multiple new bilateral pulmonary nodules, most consistent with pulmonary parenchymal metastatic disease. 3. Redemonstrated cavitary lesion of the superior segment left lower lobe, which is significantly diminished in size and with a greater volume of internal air, consistent with treatment response. 4. Overall constellation of findings is consistent with mixed response to treatment. 5. An irregular nodule of the peripheral left upper lobe with associated pleural tenting is unchanged, nonspecific. Attention on follow-up. 6. No evidence of distant metastatic disease in the abdomen pelvis. Aortic Atherosclerosis (ICD10-I70.0). Electronically Signed   By: Eddie Candle M.D.   On: 02/02/2021 10:01     ASSESSMENT/PLAN:  This is a very pleasant 75 year old Caucasian male with stage IIIc/IV (T4, N2, M0/M1 a) non-small cell lung cancer, squamous cell carcinoma presented with large necrotic mass extending to the carina with associated obstruction of the right lower lobe bronchus and postobstructive collapse/consolidation and necrosis in the right lower lobe as well as mediastinal lymphadenopathy with 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 The patient has PD-L1 expression of 20%.  He was found to have squamous cell carcinoma in the left lower lobe in May 2022 which is thought to be a second primary with some suspicious left hilar adenopathy.  The patient previously completed a course of palliative radiotherapy to the  obstructive right lower lobe lung mass under the care of Dr. Sondra Come.  He then underwent systemic chemotherapy with carboplatin and paclitaxel. He is status post 6 cycles he is treatment with [name] was discontinued after cycle #1 due to a significant skin rash.  His last dose of chemotherapy was on August 2021.  He had been on observation since that time.  In April/May 2022, the patient was found to have a left lower lobe lung mass.  The patient recently underwent a biopsy of this on 10/21/20.    Showed that this was also consistent with non-small cell lung cancer, squamous cell carcinoma.  The patient was seen on 11/04/2020.  Due to the patient's intolerance to chemotherapy in the past, Dr. Julien Nordmann recommended that the patient undergo radiation to this new lesion.  He completed radiation under the care of Dr. Sondra Come on 01/27/2021.  The patient recently had a restaging CT scan performed.  Dr.  Mohamed personally and independently reviewed the scan and discussed the results with the patient today.  The scan showed _.  Recommend that he _continuing new on observation with a restaging CT scan in 3 months.  We will see him back for follow-up visit at that time for evaluation and repeat scan.  Blood transfusions? Add sample to blood bank for next lab draw? Stool cards?   The patient was advised to call immediately if he has any concerning symptoms in the interval. The patient voices understanding of current disease status and treatment options and is in agreement with the current care plan. All questions were answered. The patient knows to call the clinic with any problems, questions or concerns. We can certainly see the patient much sooner if necessary         No orders of the defined types were placed in this encounter.    I spent {CHL ONC TIME VISIT - VNRWC:[id number]} counseling the patient face to face. The total time spent in the appointment was {CHL ONC TIME VISIT -  ZPSUG:[id number]}.  Tosh Glaze L Ramirez Fullbright, PA-C 02/02/21

## 2021-02-02 ENCOUNTER — Ambulatory Visit (INDEPENDENT_AMBULATORY_CARE_PROVIDER_SITE_OTHER): Payer: Medicare Other

## 2021-02-02 ENCOUNTER — Other Ambulatory Visit: Payer: Self-pay

## 2021-02-02 ENCOUNTER — Inpatient Hospital Stay (HOSPITAL_COMMUNITY)
Admission: EM | Admit: 2021-02-02 | Discharge: 2021-02-05 | DRG: 871 | Disposition: A | Discharge status: Hospice | Payer: Medicare Other | Attending: Internal Medicine | Admitting: Internal Medicine

## 2021-02-02 ENCOUNTER — Other Ambulatory Visit: Payer: Self-pay | Admitting: *Deleted

## 2021-02-02 ENCOUNTER — Encounter: Payer: Self-pay | Admitting: Cardiology

## 2021-02-02 ENCOUNTER — Other Ambulatory Visit: Payer: Self-pay | Admitting: Cardiology

## 2021-02-02 ENCOUNTER — Encounter (HOSPITAL_COMMUNITY): Payer: Self-pay | Admitting: Emergency Medicine

## 2021-02-02 ENCOUNTER — Ambulatory Visit (INDEPENDENT_AMBULATORY_CARE_PROVIDER_SITE_OTHER): Payer: Medicare Other | Admitting: Cardiology

## 2021-02-02 VITALS — BP 72/50 | HR 104 | Ht 74.0 in | Wt 162.4 lb

## 2021-02-02 DIAGNOSIS — N390 Urinary tract infection, site not specified: Secondary | ICD-10-CM | POA: Diagnosis present

## 2021-02-02 DIAGNOSIS — J440 Chronic obstructive pulmonary disease with acute lower respiratory infection: Secondary | ICD-10-CM | POA: Diagnosis present

## 2021-02-02 DIAGNOSIS — Z886 Allergy status to analgesic agent status: Secondary | ICD-10-CM

## 2021-02-02 DIAGNOSIS — E785 Hyperlipidemia, unspecified: Secondary | ICD-10-CM | POA: Diagnosis present

## 2021-02-02 DIAGNOSIS — R002 Palpitations: Secondary | ICD-10-CM

## 2021-02-02 DIAGNOSIS — D630 Anemia in neoplastic disease: Secondary | ICD-10-CM | POA: Diagnosis present

## 2021-02-02 DIAGNOSIS — Z9221 Personal history of antineoplastic chemotherapy: Secondary | ICD-10-CM

## 2021-02-02 DIAGNOSIS — Z6821 Body mass index (BMI) 21.0-21.9, adult: Secondary | ICD-10-CM

## 2021-02-02 DIAGNOSIS — A419 Sepsis, unspecified organism: Principal | ICD-10-CM

## 2021-02-02 DIAGNOSIS — J9611 Chronic respiratory failure with hypoxia: Secondary | ICD-10-CM | POA: Diagnosis not present

## 2021-02-02 DIAGNOSIS — Z923 Personal history of irradiation: Secondary | ICD-10-CM

## 2021-02-02 DIAGNOSIS — R627 Adult failure to thrive: Secondary | ICD-10-CM | POA: Diagnosis present

## 2021-02-02 DIAGNOSIS — Z66 Do not resuscitate: Secondary | ICD-10-CM | POA: Diagnosis not present

## 2021-02-02 DIAGNOSIS — C3491 Malignant neoplasm of unspecified part of right bronchus or lung: Secondary | ICD-10-CM | POA: Diagnosis present

## 2021-02-02 DIAGNOSIS — I9589 Other hypotension: Secondary | ICD-10-CM | POA: Diagnosis not present

## 2021-02-02 DIAGNOSIS — I10 Essential (primary) hypertension: Secondary | ICD-10-CM | POA: Diagnosis present

## 2021-02-02 DIAGNOSIS — Z20822 Contact with and (suspected) exposure to covid-19: Secondary | ICD-10-CM | POA: Diagnosis present

## 2021-02-02 DIAGNOSIS — Z7984 Long term (current) use of oral hypoglycemic drugs: Secondary | ICD-10-CM

## 2021-02-02 DIAGNOSIS — I480 Paroxysmal atrial fibrillation: Secondary | ICD-10-CM | POA: Diagnosis not present

## 2021-02-02 DIAGNOSIS — E119 Type 2 diabetes mellitus without complications: Secondary | ICD-10-CM

## 2021-02-02 DIAGNOSIS — Z833 Family history of diabetes mellitus: Secondary | ICD-10-CM | POA: Diagnosis not present

## 2021-02-02 DIAGNOSIS — Z888 Allergy status to other drugs, medicaments and biological substances status: Secondary | ICD-10-CM

## 2021-02-02 DIAGNOSIS — N400 Enlarged prostate without lower urinary tract symptoms: Secondary | ICD-10-CM | POA: Diagnosis present

## 2021-02-02 DIAGNOSIS — C799 Secondary malignant neoplasm of unspecified site: Secondary | ICD-10-CM | POA: Diagnosis present

## 2021-02-02 DIAGNOSIS — E861 Hypovolemia: Secondary | ICD-10-CM

## 2021-02-02 DIAGNOSIS — Z79899 Other long term (current) drug therapy: Secondary | ICD-10-CM

## 2021-02-02 DIAGNOSIS — Z87440 Personal history of urinary (tract) infections: Secondary | ICD-10-CM

## 2021-02-02 DIAGNOSIS — D649 Anemia, unspecified: Secondary | ICD-10-CM

## 2021-02-02 DIAGNOSIS — I959 Hypotension, unspecified: Secondary | ICD-10-CM

## 2021-02-02 DIAGNOSIS — J189 Pneumonia, unspecified organism: Secondary | ICD-10-CM | POA: Diagnosis present

## 2021-02-02 DIAGNOSIS — Z87891 Personal history of nicotine dependence: Secondary | ICD-10-CM

## 2021-02-02 DIAGNOSIS — B957 Other staphylococcus as the cause of diseases classified elsewhere: Secondary | ICD-10-CM | POA: Diagnosis present

## 2021-02-02 DIAGNOSIS — R000 Tachycardia, unspecified: Secondary | ICD-10-CM | POA: Diagnosis not present

## 2021-02-02 DIAGNOSIS — Z8249 Family history of ischemic heart disease and other diseases of the circulatory system: Secondary | ICD-10-CM

## 2021-02-02 DIAGNOSIS — J90 Pleural effusion, not elsewhere classified: Secondary | ICD-10-CM | POA: Diagnosis present

## 2021-02-02 LAB — HEMOGLOBIN A1C
Hgb A1c MFr Bld: 6.7 % — ABNORMAL HIGH (ref 4.8–5.6)
Mean Plasma Glucose: 145.59 mg/dL

## 2021-02-02 LAB — URINALYSIS, ROUTINE W REFLEX MICROSCOPIC
Bilirubin Urine: NEGATIVE
Glucose, UA: NEGATIVE mg/dL
Ketones, ur: NEGATIVE mg/dL
Leukocytes,Ua: NEGATIVE
Nitrite: NEGATIVE
Protein, ur: 30 mg/dL — AB
Specific Gravity, Urine: 1.02 (ref 1.005–1.030)
pH: 5 (ref 5.0–8.0)

## 2021-02-02 LAB — LACTIC ACID, PLASMA
Lactic Acid, Venous: 1.1 mmol/L (ref 0.5–1.9)
Lactic Acid, Venous: 1.1 mmol/L (ref 0.5–1.9)

## 2021-02-02 LAB — CBC WITH DIFFERENTIAL/PLATELET
Abs Immature Granulocytes: 0.15 10*3/uL — ABNORMAL HIGH (ref 0.00–0.07)
Basophils Absolute: 0 10*3/uL (ref 0.0–0.1)
Basophils Relative: 0 %
Eosinophils Absolute: 0.2 10*3/uL (ref 0.0–0.5)
Eosinophils Relative: 1 %
HCT: 19.6 % — ABNORMAL LOW (ref 39.0–52.0)
Hemoglobin: 5.8 g/dL — CL (ref 13.0–17.0)
Immature Granulocytes: 1 %
Lymphocytes Relative: 2 %
Lymphs Abs: 0.3 10*3/uL — ABNORMAL LOW (ref 0.7–4.0)
MCH: 30.7 pg (ref 26.0–34.0)
MCHC: 29.6 g/dL — ABNORMAL LOW (ref 30.0–36.0)
MCV: 103.7 fL — ABNORMAL HIGH (ref 80.0–100.0)
Monocytes Absolute: 0.3 10*3/uL (ref 0.1–1.0)
Monocytes Relative: 2 %
Neutro Abs: 15.5 10*3/uL — ABNORMAL HIGH (ref 1.7–7.7)
Neutrophils Relative %: 94 %
Platelets: 263 10*3/uL (ref 150–400)
RBC: 1.89 MIL/uL — ABNORMAL LOW (ref 4.22–5.81)
RDW: 17.2 % — ABNORMAL HIGH (ref 11.5–15.5)
WBC: 16.4 10*3/uL — ABNORMAL HIGH (ref 4.0–10.5)
nRBC: 0 % (ref 0.0–0.2)

## 2021-02-02 LAB — COMPREHENSIVE METABOLIC PANEL
ALT: 19 U/L (ref 0–44)
AST: 16 U/L (ref 15–41)
Albumin: 2.2 g/dL — ABNORMAL LOW (ref 3.5–5.0)
Alkaline Phosphatase: 57 U/L (ref 38–126)
Anion gap: 8 (ref 5–15)
BUN: 22 mg/dL (ref 8–23)
CO2: 27 mmol/L (ref 22–32)
Calcium: 8.2 mg/dL — ABNORMAL LOW (ref 8.9–10.3)
Chloride: 101 mmol/L (ref 98–111)
Creatinine, Ser: 0.93 mg/dL (ref 0.61–1.24)
GFR, Estimated: >60 mL/min (ref >60)
Glucose, Bld: 134 mg/dL — ABNORMAL HIGH (ref 70–99)
Potassium: 4.5 mmol/L (ref 3.5–5.1)
Sodium: 136 mmol/L (ref 135–145)
Total Bilirubin: 0.4 mg/dL (ref 0.3–1.2)
Total Protein: 7.2 g/dL (ref 6.5–8.1)

## 2021-02-02 LAB — URINALYSIS, MICROSCOPIC (REFLEX)

## 2021-02-02 LAB — APTT: aPTT: 38 seconds — ABNORMAL HIGH (ref 24–36)

## 2021-02-02 LAB — PROTIME-INR
INR: 1.2 (ref 0.8–1.2)
Prothrombin Time: 15.5 seconds — ABNORMAL HIGH (ref 11.4–15.2)

## 2021-02-02 LAB — RESP PANEL BY RT-PCR (FLU A&B, COVID) ARPGX2
Influenza A by PCR: NEGATIVE
Influenza B by PCR: NEGATIVE
SARS Coronavirus 2 by RT PCR: NEGATIVE

## 2021-02-02 LAB — PROCALCITONIN: Procalcitonin: 0.3 ng/mL

## 2021-02-02 LAB — PREPARE RBC (CROSSMATCH)

## 2021-02-02 MED ORDER — VANCOMYCIN HCL 1500 MG/300ML IV SOLN
1500.0000 mg | INTRAVENOUS | Status: DC
  Administered 2021-02-03: 1500 mg via INTRAVENOUS
  Filled 2021-02-02: qty 300

## 2021-02-02 MED ORDER — ONDANSETRON HCL 4 MG/2ML IJ SOLN: 4.0000 mg | Freq: Four times a day (QID) | INTRAMUSCULAR | Status: DC | PRN

## 2021-02-02 MED ORDER — TAMSULOSIN HCL 0.4 MG PO CAPS
0.4000 mg | ORAL_CAPSULE | Freq: Every day | ORAL | Status: DC
  Administered 2021-02-03 – 2021-02-05 (×3): 0.4 mg via ORAL
  Filled 2021-02-02 (×3): qty 1

## 2021-02-02 MED ORDER — PIPERACILLIN-TAZOBACTAM 3.375 G IVPB 30 MIN: 3.3750 g | Freq: Once | INTRAVENOUS | Status: DC

## 2021-02-02 MED ORDER — METOPROLOL TARTRATE 50 MG PO TABS
50.0000 mg | ORAL_TABLET | Freq: Two times a day (BID) | ORAL | Status: DC
  Administered 2021-02-03 – 2021-02-05 (×5): 50 mg via ORAL
  Filled 2021-02-02 (×5): qty 1

## 2021-02-02 MED ORDER — OXYCODONE-ACETAMINOPHEN 5-325 MG PO TABS
1.0000 | ORAL_TABLET | Freq: Four times a day (QID) | ORAL | Status: DC | PRN
Start: 2021-02-02 — End: 2021-02-05
  Administered 2021-02-02 – 2021-02-05 (×6): 1 via ORAL
  Filled 2021-02-02 (×6): qty 1

## 2021-02-02 MED ORDER — METOPROLOL TARTRATE 50 MG PO TABS: 50.0000 mg | ORAL_TABLET | Freq: Two times a day (BID) | ORAL | 3 refills | Status: AC

## 2021-02-02 MED ORDER — LACTATED RINGERS IV BOLUS (SEPSIS)
250.0000 mL | Freq: Once | INTRAVENOUS | Status: AC
  Administered 2021-02-02: 250 mL via INTRAVENOUS

## 2021-02-02 MED ORDER — SODIUM CHLORIDE 0.9 % IV SOLN
2.0000 g | Freq: Once | INTRAVENOUS | Status: AC
  Administered 2021-02-02: 2 g via INTRAVENOUS
  Filled 2021-02-02: qty 2

## 2021-02-02 MED ORDER — VANCOMYCIN HCL 1750 MG/350ML IV SOLN
1750.0000 mg | Freq: Once | INTRAVENOUS | Status: AC
  Administered 2021-02-02: 1750 mg via INTRAVENOUS
  Filled 2021-02-02: qty 350

## 2021-02-02 MED ORDER — INSULIN ASPART 100 UNIT/ML IJ SOLN
0.0000 [IU] | Freq: Three times a day (TID) | INTRAMUSCULAR | Status: DC
  Administered 2021-02-03 – 2021-02-05 (×3): 1 [IU] via SUBCUTANEOUS

## 2021-02-02 MED ORDER — ONDANSETRON HCL 4 MG PO TABS: 4.0000 mg | ORAL_TABLET | Freq: Four times a day (QID) | ORAL | Status: DC | PRN

## 2021-02-02 MED ORDER — ACETAMINOPHEN 650 MG RE SUPP: 650.0000 mg | Freq: Four times a day (QID) | RECTAL | Status: DC | PRN

## 2021-02-02 MED ORDER — ACETAMINOPHEN 325 MG PO TABS
650.0000 mg | ORAL_TABLET | Freq: Four times a day (QID) | ORAL | Status: DC | PRN
  Administered 2021-02-04: 650 mg via ORAL
  Filled 2021-02-02: qty 2

## 2021-02-02 MED ORDER — ALBUTEROL SULFATE (2.5 MG/3ML) 0.083% IN NEBU
2.5000 mg | INHALATION_SOLUTION | RESPIRATORY_TRACT | Status: DC | PRN
  Administered 2021-02-02: 2.5 mg via RESPIRATORY_TRACT
  Filled 2021-02-02: qty 3

## 2021-02-02 MED ORDER — LACTATED RINGERS IV SOLN
INTRAVENOUS | Status: DC
  Administered 2021-02-02: 750 mL via INTRAVENOUS

## 2021-02-02 MED ORDER — INSULIN ASPART 100 UNIT/ML IJ SOLN: 0.0000 [IU] | Freq: Every day | INTRAMUSCULAR | Status: DC

## 2021-02-02 MED ORDER — SODIUM CHLORIDE 0.9 % IV SOLN: 10.0000 mL/h | Freq: Once | INTRAVENOUS | Status: DC

## 2021-02-02 MED ORDER — LACTATED RINGERS IV BOLUS (SEPSIS)
1000.0000 mL | Freq: Once | INTRAVENOUS | Status: AC
  Administered 2021-02-02: 1000 mL via INTRAVENOUS

## 2021-02-02 MED ORDER — PIPERACILLIN-TAZOBACTAM 3.375 G IVPB 30 MIN: 3.3750 g | Freq: Four times a day (QID) | INTRAVENOUS | Status: DC

## 2021-02-02 MED ORDER — SODIUM CHLORIDE 0.9 % IV SOLN: 2.0000 g | Freq: Three times a day (TID) | INTRAVENOUS | Status: DC

## 2021-02-02 MED ORDER — PIPERACILLIN-TAZOBACTAM 3.375 G IVPB
3.3750 g | Freq: Three times a day (TID) | INTRAVENOUS | Status: DC
  Administered 2021-02-03 – 2021-02-04 (×4): 3.375 g via INTRAVENOUS
  Filled 2021-02-02 (×5): qty 50

## 2021-02-02 NOTE — Progress Notes (Signed)
eLink Physician-Brief Progress Note Patient Name: Jeremy [name] DOB: 08-02-1945 MRN: [id number]   Date of Service  02/02/2021  HPI/Events of Note  Patient with metastatic squamous cell carcinoma admitted with hypotension, severe anemia, sepsis, and failure to thrive.  eICU Interventions  New Patient Evaluation completed.        Kerry Kass Paxson Harrower 02/02/2021, 8:01 PM

## 2021-02-02 NOTE — Assessment & Plan Note (Signed)
Admit to ICU. Continue with 2 unit PRBC transfusion that was ordered by EDP. Repeat HgB in AM.

## 2021-02-02 NOTE — Sepsis Progress Note (Signed)
Code sepsis protocol being monitored by eLink. 

## 2021-02-02 NOTE — H&P (Signed)
History and Physical    SHAHRUKH PASCH IHK:[id number] DOB: [birth year] DOA: 02/02/2021  PCP: [name], MD   Patient coming from: Clinic  I have personally briefly reviewed patient's old medical records in [place]  CC: sent from cardiology office due to low BP HPI: 75 year old male with a history of metastatic squamous cell lung cancer, atrial fibrillation, COPD, chronic hypoxic respiratory failure, chronic anemia, type 2 diabetes who was sent over to the ER today from the cardiology office.  Patient had to be hypotensive in the cardiology office.  Patient recently completed external beam radiation for his squamous cell lung cancer.  Patient is no longer getting chemotherapy.  Wife is under the impression that the patient was undergoing curative radiation therapy.  Patient had a CT scan yesterday which demonstrated worsening of his right lower lobe disease process with worsening lymphangitic spread of his cancer.  Patient also recently treated with 2 rounds of antibiotics including Bactrim and then amoxicillin for recurrent UTI with Proteus and Enterococcus.  Wife provides majority history is a patient defers to her.  I did discuss the case over the phone with the wife.  Wife states over the last couple days the patient's not been feeling well.  Has been planing of more shortness of breath.  Patient denies any diarrhea or abdominal pain.  Wife is unclear if he had any fevers.  He has had worsening shortness of breath and a cough.  He has been using his oxygen more so the last couple days.  Patient transported the ER from the cardiology office.  In ER, patient noted to be afebrile with a temperature 97.7.  Heart rate is 105.  Blood pressure 89/69.  Patient started on a fluid bolus.  Left for evaluation white count was 16.4.  Hemoglobin 5.8 g/dL.  Patient started on a blood transfusion.  Due to patient's hypotension, anemia and possible sepsis, [facility] contacted for  admission.   ED Course: given IVF bolus, PRBC transfusion started. Started on IV cefepime. Blood cx obtained before IV cefepime. UA and cultures not yet obtained prior to IV ABX.  Review of Systems:  Review of Systems  Constitutional:  Positive for malaise/fatigue.  HENT: Negative.    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   Gastrointestinal:  Negative for nausea and vomiting.  Genitourinary: Negative.   Musculoskeletal:  Positive for back pain.  Skin: Negative.   Neurological:  Positive for dizziness.  Endo/Heme/Allergies: Negative.   Psychiatric/Behavioral: Negative.    All other systems reviewed and are negative.  Past Medical History:  Diagnosis Date   Anemia    Cancer ([place])    mass right lung   COPD (chronic obstructive pulmonary disease) ([place])    QUIT SMOKING 30 YRS AGO   Diabetes ([place]) 2018   Dysrhythmia    PAF   Empyema ([place])    History of radiation therapy 08/11/2019 through 08/28/2019   right lung        Dr Gery Pray   HTN (hypertension)    Hyperlipidemia    Neuromuscular disorder ([place])    PONV (postoperative nausea and vomiting)    Psoriatic arthritis ([place]) 2015   Sepsis ([place])    possibly from right port and traveled to eye    Past Surgical History:  Procedure Laterality Date   [place]   BIOPSY  12/30/2018   Procedure: BIOPSY;  Surgeon: Danie Binder, MD;  [facility];  Service: Endoscopy;;  gastric   CATARACT EXTRACTION Bilateral    CERVICAL SPINE SURGERY  1995   COLONOSCOPY WITH PROPOFOL N/A 12/30/2018   Procedure: COLONOSCOPY WITH PROPOFOL;  Surgeon: Danie Binder, MD;  [facility];  Service: Endoscopy;  Laterality: N/A;  12:30pm   ESOPHAGOGASTRODUODENOSCOPY (EGD) WITH PROPOFOL N/A 12/30/2018   Procedure: ESOPHAGOGASTRODUODENOSCOPY (EGD) WITH PROPOFOL;  Surgeon: Danie Binder, MD;  [facility];  Service: Endoscopy;  Laterality: N/A;   EYE  SURGERY     detached retina   IR IMAGING GUIDED PORT INSERTION  09/04/2019   LUMBAR LAMINECTOMY/ DECOMPRESSION WITH MET-RX Right 08/30/2020   Procedure: Right Lumbar three-four Microdiscectomy;  Surgeon: Kristeen Miss, MD;  Location: [facility];  Service: Neurosurgery;  Laterality: Right;   POLYPECTOMY  12/30/2018   Procedure: POLYPECTOMY;  Surgeon: Danie Binder, MD;  [facility];  Service: Endoscopy;;  colon   THORACENTESIS Right 08/08/2019   Procedure: Thoracentesis;  Surgeon: [name], MD;  Location: [facility];  Service: Pulmonary;  Laterality: Right;   VIDEO BRONCHOSCOPY Right 08/08/2019   Procedure: Video Bronchoscopy with Erbe Cryo Biopsy of Right mainstem;  Surgeon: [name], MD;  [facility];  Service: Pulmonary;  Laterality: Right;     reports that he quit smoking about 29 years ago. His smoking use included cigarettes. He has a 25.00 pack-year smoking history. He has never used smokeless tobacco. He reports current alcohol use. He reports that he does not use drugs.  Allergies  Allergen Reactions   Naproxen Sodium Hives, Swelling, Rash and Other (See Comments)    "Made the face, lips, and mouth swell- had to go to the E.R."   Pembrolizumab Rash and Other (See Comments)    KEYTRUDA   Chlorhexidine     No central line   Nsaids Hives, Swelling and Rash    Family History  Problem Relation Age of Onset   Alcoholism Father    CAD Brother    Diabetes Brother    Colon cancer Neg Hx    Colon polyps Neg Hx    Gastric cancer Neg Hx     Prior to Admission medications   Medication Sig Start Date End Date Taking? Authorizing Provider  acetaminophen (TYLENOL) 325 MG tablet Take 2 tablets (650 mg total) by mouth every 6 (six) hours as needed for mild pain, fever or headache. 10/28/19  Yes Emokpae, Courage, MD  albuterol (VENTOLIN HFA) 108 (90 Base) MCG/ACT inhaler Inhale 2 puffs into the lungs every 6 (six) hours as needed for wheezing or shortness of breath.   Yes  [provider]  bisacodyl (DULCOLAX) 5 MG EC tablet Take 10 mg by mouth daily as needed for moderate constipation.   Yes [provider]  cyclobenzaprine (FLEXERIL) 10 MG tablet Take 10 mg by mouth at bedtime as needed for muscle spasms. 11/23/19  Yes [provider]  docusate sodium (COLACE) 100 MG capsule Take 100 mg by mouth at bedtime.   Yes [provider]  ferrous sulfate 325 (65 FE) MG tablet Take 325 mg by mouth daily with supper.   Yes [provider]  folic acid (FOLVITE) 1 MG tablet Take 1 mg by mouth every evening.   Yes [provider]  gabapentin (NEURONTIN) 300 MG capsule Take 600 mg by mouth at bedtime as needed (for neuropathy).   Yes [provider]  meclizine (ANTIVERT) 25  MG tablet Take 25 mg by mouth 3 (three) times daily as needed for dizziness.   Yes [provider]  metFORMIN (GLUCOPHAGE-XR) 500 MG 24 hr tablet Take 500-1,000 mg by mouth See admin instructions. Take 1,000 mg by mouth in the morning and 500 mg at bedtime 10/15/18  Yes [provider]  methotrexate (RHEUMATREX) 2.5 MG tablet Take 10 mg by mouth every Sunday. Caution:Chemotherapy. Protect from light.   Yes [provider]  metoprolol tartrate (LOPRESSOR) 50 MG tablet Take 1 tablet (50 mg total) by mouth 2 (two) times daily. 02/02/21  Yes BranchAlphonse Guild, MD  Multiple Vitamins-Minerals (CENTRUM ADULTS PO) Take 1 tablet by mouth daily at 6 (six) AM.   Yes [provider]  Omega-3 Fatty Acids (FISH OIL) 1000 MG CAPS Take 1,000 mg by mouth daily.    Yes [provider]  oxyCODONE-acetaminophen (PERCOCET) 10-325 MG tablet Take 1 tablet by mouth every 6 (six) hours as needed for pain.   Yes [provider]  tamsulosin (FLOMAX) 0.4 MG CAPS capsule Take 0.4 mg by mouth daily. 03/16/20  Yes [provider]  traMADol (ULTRAM) 50 MG tablet Take 50 mg by mouth every 4 (four) hours as needed for  moderate pain. 08/19/18  Yes [provider]    Physical Exam: Vitals:   02/02/21 1400 02/02/21 1430 02/02/21 1544 02/02/21 1559  BP: (!) 89/55 109/68 101/64 105/64  Pulse: 91  93 91  Resp: (!) 35 (!) 24 (!) 28 (!) 22  Temp:   97.9 F (36.6 C) 98 F (36.7 C)  TempSrc:      SpO2: 99%  100% 100%    Physical Exam Vitals and nursing note reviewed.  Constitutional:      Comments: Chronically ill appearing male.  HENT:     Head: Normocephalic and atraumatic.     Nose: Nose normal. No rhinorrhea.  Eyes:     General: No scleral icterus. Cardiovascular:     Rate and Rhythm: Regular rhythm. Tachycardia present.  Pulmonary:     Effort: No respiratory distress.     Breath sounds: Examination of the right-middle field reveals rales. Examination of the left-middle field reveals rales. Examination of the right-lower field reveals decreased breath sounds. Examination of the left-lower field reveals decreased breath sounds. Decreased breath sounds and rales present. No rhonchi.  Abdominal:     General: Abdomen is flat. Bowel sounds are normal. There is no distension.     Palpations: Abdomen is soft.     Tenderness: There is no abdominal tenderness. There is no guarding.  Musculoskeletal:     Right lower leg: No edema.     Left lower leg: No edema.  Skin:    General: Skin is warm and dry.     Capillary Refill: Capillary refill takes less than 2 seconds.  Neurological:     General: No focal deficit present.     Mental Status: He is oriented to person, place, and time.     Labs on Admission: I have personally reviewed following labs and imaging studies  CBC: Recent Labs  Lab 02/01/21 1028 02/02/21 1307  WBC 16.8* 16.4*  NEUTROABS 15.7* 15.5*  HGB 7.3* 5.8*  HCT 24.0* 19.6*  MCV 99.6 103.7*  PLT 271 914   Basic Metabolic Panel: Recent Labs  Lab 02/01/21 1028 02/02/21 1307  NA 139 136  K 4.4 4.5  CL 101 101  CO2 27 27  GLUCOSE 135* 134*  BUN 20 22  CREATININE  0.83 0.93  CALCIUM 9.6 8.2*   GFR: Estimated Creatinine Clearance: 69.3 mL/min (by C-G formula based on SCr of 0.93 mg/dL). Liver Function Tests: Recent Labs  Lab 02/01/21 1028 02/02/21 1307  AST 16 16  ALT 19 19  ALKPHOS 71 57  BILITOT 0.3 0.4  PROT 7.9 7.2  ALBUMIN 2.0* 2.2*   No results for input(s): LIPASE, AMYLASE in the last 168 hours. No results for input(s): AMMONIA in the last 168 hours. Coagulation Profile: Recent Labs  Lab 02/02/21 1307  INR 1.2   Cardiac Enzymes: No results for input(s): CKTOTAL, CKMB, CKMBINDEX, TROPONINI in the last 168 hours. BNP (last 3 results) No results for input(s): PROBNP in the last 8760 hours. HbA1C: No results for input(s): HGBA1C in the last 72 hours. CBG: No results for input(s): GLUCAP in the last 168 hours. Lipid Profile: No results for input(s): CHOL, HDL, LDLCALC, TRIG, CHOLHDL, LDLDIRECT in the last 72 hours. Thyroid Function Tests: No results for input(s): TSH, T4TOTAL, FREET4, T3FREE, THYROIDAB in the last 72 hours. Anemia Panel: No results for input(s): VITAMINB12, FOLATE, FERRITIN, TIBC, IRON, RETICCTPCT in the last 72 hours. Urine analysis:    Component Value Date/Time   COLORURINE YELLOW 12/28/2020 1203   APPEARANCEUR HAZY (A) 12/28/2020 1203   LABSPEC 1.019 12/28/2020 1203   PHURINE 5.0 12/28/2020 1203   GLUCOSEU NEGATIVE 12/28/2020 1203   HGBUR NEGATIVE 12/28/2020 1203   [place] 12/28/2020 [place] 12/28/2020 1203   PROTEINUR 100 (A) 12/28/2020 1203   NITRITE NEGATIVE 12/28/2020 1203   LEUKOCYTESUR NEGATIVE 12/28/2020 1203    Radiological Exams on Admission: I have personally reviewed images CT Chest W Contrast  Result Date: 02/02/2021 CLINICAL DATA:  Primary Cancer Type: Lung Imaging Indication: Assess response to therapy Interval therapy since last imaging? Yes Initial Cancer Diagnosis Date: 08/08/2019; Established by: Biopsy-proven Detailed Pathology: Stage IIIC/IV  non-small cell lung cancer, squamous cell carcinoma. Primary Tumor location: Initial right lower lobe. New/enlarging left lower lobe lesion. Surgeries: Microdiscectomy right L3-4 08/30/2020. Chemotherapy: Yes; Ongoing? No; Most recent administration: 01/21/2020 Immunotherapy?  Yes; Type: Keytruda; Ongoing? No Radiation therapy? Yes Date Range: 11/23/2020 - 01/27/2021; Target: Left lung Date Range: 08/11/2019 - 08/28/2019; Target: Right lung EXAM: CT CHEST WITH CONTRAST TECHNIQUE: Multidetector CT imaging of the chest was performed during intravenous contrast administration. CONTRAST:  4mL OMNIPAQUE IOHEXOL 350 MG/ML SOLN COMPARISON:  CT chest PE study, 12/01/2020, 10/15/2020 PET-CT. CT chest, abdomen and pelvis 10/04/2020. FINDINGS: CT CHEST FINDINGS Cardiovascular: Aortic atherosclerosis. Normal heart size. Left coronary artery calcifications. No pericardial effusion. Mediastinum/Nodes: No enlarged mediastinal, hilar, or axillary lymph nodes. Thyroid gland, trachea, and esophagus demonstrate no significant findings. Lungs/Pleura: Unchanged moderate, loculated right pleural effusion with extensive associated pleural thickening. Redemonstrated post treatment fibrosis and consolidation of the perihilar right lung and right lower lobe. There has been a very substantial increase in dense consolidation and volume loss of the right lung, particular in the posterior right upper lobe (series 5, image 58). There is a corresponding increase in degree of obstruction of the right lobar bronchi. Multiple new nodules in the aerated portion of the lung, for example a 1.7 x 1.0 cm nodule of the peripheral right upper lobe (series 5, image 83). There is interlobular septal thickening throughout the aerated portions the right lung. Redemonstrated cavitary lesion of the superior segment left lower lobe, which is significantly diminished in size and with a greater volume of internal air, measuring approximately 4.3 x 3.8 cm,  previously 7.0  x 5.1 cm when measured similarly (series 5, image 85). There is bronchial wall thickening and plugging in this vicinity. There are new, clustered nodules in the dependent left lower lobe inferior to this lesion (series 5, image 114) and a new subpleural nodule of the superior segment left lower lobe measuring 1.2 x 0.9 cm (series 5, image 49). An irregular nodule of the peripheral left upper lobe with associated pleural tenting is unchanged, measuring 0.9 x 0.7 cm (series 5, image 55). Musculoskeletal: No chest wall mass or suspicious bone lesions identified. CT ABDOMEN PELVIS FINDINGS Hepatobiliary: No solid liver abnormality is seen. No gallstones, gallbladder wall thickening, or biliary dilatation. Pancreas: Unremarkable. No pancreatic ductal dilatation or surrounding inflammatory changes. Spleen: Normal in size without significant abnormality. Adrenals/Urinary Tract: Adrenal glands are unremarkable. Kidneys are normal, without renal calculi, solid lesion, or hydronephrosis. Bladder is unremarkable. Stomach/Bowel: Stomach is within normal limits. Small incidental diverticulum of the transverse portion of the duodenum. Appendix appears normal. No evidence of bowel wall thickening, distention, or inflammatory changes. Descending and sigmoid diverticulosis. Large burden of stool throughout the colon rectum. Vascular/Lymphatic: Aortic atherosclerosis. No enlarged abdominal or pelvic lymph nodes. Reproductive: No mass or other abnormality. Other: No abdominal wall hernia or abnormality. No abdominopelvic ascites. Musculoskeletal: No acute or significant osseous findings. IMPRESSION: 1. Redemonstrated post treatment fibrosis and consolidation of the perihilar right lung and right lower lobe. There has been a very substantial increase in dense consolidation and volume loss of the right lung, particularly in the posterior right upper lobe. There is a corresponding increase in degree of obstruction of the  right lobar bronchi. Interlobular septal thickening throughout the aerated portions of the right lung. Findings are consistent with locally worsened perihilar right lung malignancy with significant postobstructive airspace disease as well as pleural and lymphangitic involvement. 2. Multiple new bilateral pulmonary nodules, most consistent with pulmonary parenchymal metastatic disease. 3. Redemonstrated cavitary lesion of the superior segment left lower lobe, which is significantly diminished in size and with a greater volume of internal air, consistent with treatment response. 4. Overall constellation of findings is consistent with mixed response to treatment. 5. An irregular nodule of the peripheral left upper lobe with associated pleural tenting is unchanged, nonspecific. Attention on follow-up. 6. No evidence of distant metastatic disease in the abdomen pelvis. Aortic Atherosclerosis (ICD10-I70.0). Electronically Signed   By: Eddie Candle M.D.   On: 02/02/2021 10:01   CT Abdomen Pelvis W Contrast  Result Date: 02/02/2021 CLINICAL DATA:  Primary Cancer Type: Lung Imaging Indication: Assess response to therapy Interval therapy since last imaging? Yes Initial Cancer Diagnosis Date: 08/08/2019; Established by: Biopsy-proven Detailed Pathology: Stage IIIC/IV non-small cell lung cancer, squamous cell carcinoma. Primary Tumor location: Initial right lower lobe. New/enlarging left lower lobe lesion. Surgeries: Microdiscectomy right L3-4 08/30/2020. Chemotherapy: Yes; Ongoing? No; Most recent administration: 01/21/2020 Immunotherapy?  Yes; Type: Keytruda; Ongoing? No Radiation therapy? Yes Date Range: 11/23/2020 - 01/27/2021; Target: Left lung Date Range: 08/11/2019 - 08/28/2019; Target: Right lung EXAM: CT CHEST WITH CONTRAST TECHNIQUE: Multidetector CT imaging of the chest was performed during intravenous contrast administration. CONTRAST:  6m OMNIPAQUE IOHEXOL 350 MG/ML SOLN COMPARISON:  CT chest PE study,  12/01/2020, 10/15/2020 PET-CT. CT chest, abdomen and pelvis 10/04/2020. FINDINGS: CT CHEST FINDINGS Cardiovascular: Aortic atherosclerosis. Normal heart size. Left coronary artery calcifications. No pericardial effusion. Mediastinum/Nodes: No enlarged mediastinal, hilar, or axillary lymph nodes. Thyroid gland, trachea, and esophagus demonstrate no significant findings. Lungs/Pleura: Unchanged moderate, loculated right pleural effusion with extensive  associated pleural thickening. Redemonstrated post treatment fibrosis and consolidation of the perihilar right lung and right lower lobe. There has been a very substantial increase in dense consolidation and volume loss of the right lung, particular in the posterior right upper lobe (series 5, image 58). There is a corresponding increase in degree of obstruction of the right lobar bronchi. Multiple new nodules in the aerated portion of the lung, for example a 1.7 x 1.0 cm nodule of the peripheral right upper lobe (series 5, image 83). There is interlobular septal thickening throughout the aerated portions the right lung. Redemonstrated cavitary lesion of the superior segment left lower lobe, which is significantly diminished in size and with a greater volume of internal air, measuring approximately 4.3 x 3.8 cm, previously 7.0 x 5.1 cm when measured similarly (series 5, image 85). There is bronchial wall thickening and plugging in this vicinity. There are new, clustered nodules in the dependent left lower lobe inferior to this lesion (series 5, image 114) and a new subpleural nodule of the superior segment left lower lobe measuring 1.2 x 0.9 cm (series 5, image 49). An irregular nodule of the peripheral left upper lobe with associated pleural tenting is unchanged, measuring 0.9 x 0.7 cm (series 5, image 55). Musculoskeletal: No chest wall mass or suspicious bone lesions identified. CT ABDOMEN PELVIS FINDINGS Hepatobiliary: No solid liver abnormality is seen. No  gallstones, gallbladder wall thickening, or biliary dilatation. Pancreas: Unremarkable. No pancreatic ductal dilatation or surrounding inflammatory changes. Spleen: Normal in size without significant abnormality. Adrenals/Urinary Tract: Adrenal glands are unremarkable. Kidneys are normal, without renal calculi, solid lesion, or hydronephrosis. Bladder is unremarkable. Stomach/Bowel: Stomach is within normal limits. Small incidental diverticulum of the transverse portion of the duodenum. Appendix appears normal. No evidence of bowel wall thickening, distention, or inflammatory changes. Descending and sigmoid diverticulosis. Large burden of stool throughout the colon rectum. Vascular/Lymphatic: Aortic atherosclerosis. No enlarged abdominal or pelvic lymph nodes. Reproductive: No mass or other abnormality. Other: No abdominal wall hernia or abnormality. No abdominopelvic ascites. Musculoskeletal: No acute or significant osseous findings. IMPRESSION: 1. Redemonstrated post treatment fibrosis and consolidation of the perihilar right lung and right lower lobe. There has been a very substantial increase in dense consolidation and volume loss of the right lung, particularly in the posterior right upper lobe. There is a corresponding increase in degree of obstruction of the right lobar bronchi. Interlobular septal thickening throughout the aerated portions of the right lung. Findings are consistent with locally worsened perihilar right lung malignancy with significant postobstructive airspace disease as well as pleural and lymphangitic involvement. 2. Multiple new bilateral pulmonary nodules, most consistent with pulmonary parenchymal metastatic disease. 3. Redemonstrated cavitary lesion of the superior segment left lower lobe, which is significantly diminished in size and with a greater volume of internal air, consistent with treatment response. 4. Overall constellation of findings is consistent with mixed response to  treatment. 5. An irregular nodule of the peripheral left upper lobe with associated pleural tenting is unchanged, nonspecific. Attention on follow-up. 6. No evidence of distant metastatic disease in the abdomen pelvis. Aortic Atherosclerosis (ICD10-I70.0). Electronically Signed   By: Eddie Candle M.D.   On: 02/02/2021 10:01    EKG: I have personally reviewed EKG: shows sinus tachycardia   Assessment/Plan Principal Problem:   Symptomatic anemia Active Problems:   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HCC)   Sepsis without acute organ dysfunction (HCC)   Hypotension due to hypovolemia   Chronic respiratory failure  with hypoxia (HCC) - 2 L/min   Type 2 diabetes mellitus (HCC)   AF (paroxysmal atrial fibrillation) ([place])   DNR (do not resuscitate)/DNI    Symptomatic anemia Admit to ICU. Continue with 2 unit PRBC transfusion that was ordered by EDP. Repeat HgB in AM.  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[place]) Pt just finished XRT. Not receiving anymore chemo. CT chest from yesterday shows worsening disease in RLL. Unclear if pt has post-obstructive pneumonia given increasing bronchial obstruction. Will cover with IV Zosyn and IV Vanco for now. Will benefit by checking procalcitonin.  Sepsis without acute organ dysfunction (HCC) Unclear if pt's elevated WBC and hypotension from infection or hypovolemia. Pt given IVF bolus in ER along with PRBC transfusion. Pt has been treated as outpatient for persistent UTI with 2 courses of po abx per wife.  Urine cx grew enterococcus and proteus. Was treated with bactrim and then amoxil. Enterococcus was pcn sensitive. Awaiting UA with culture.  Hypotension due to hypovolemia Suspect that hypotension related to hypovolemia. Pt receiving PRBC now and completed IVF boluses. If pt still hypotensive after PRBC transfusion, pt may need peripheral levophed to support blood pressure.  Chronic respiratory failure with hypoxia (HCC) - 2  L/min Verified with pt and wife that pt is on 2 L/min at home.  Type 2 diabetes mellitus (HCC) Stable. Add SSI.  AF (paroxysmal atrial fibrillation) (HCC) Stable. Pt not on ASA or systemic anticoagulants due to anemia. Wife states cardiology did not want to risk bleeding with this patient.  DNR (do not resuscitate)/DNI Verified with pt and pt's wife [name] that pt is a DNR/DNI.  DVT prophylaxis: SCDs Code Status: DNR/DNI(Do NOT Intubate) Family Communication: discussed with pt's wife [place] via phone  Disposition Plan: return to home  Consults called: none  Admission status: Inpatient,  ICU   Kristopher Oppenheim, DO [facility] 02/02/2021, 4:35 PM

## 2021-02-02 NOTE — Assessment & Plan Note (Signed)
Pt just finished XRT. Not receiving anymore chemo. CT chest from yesterday shows worsening disease in RLL. Unclear if pt has post-obstructive pneumonia given increasing bronchial obstruction. Will cover with IV Zosyn and IV Vanco for now. Will benefit by checking procalcitonin.

## 2021-02-02 NOTE — Progress Notes (Addendum)
Pharmacy Antibiotic Note  Jeremy [name] is a 75 y.o. male admitted on 02/02/2021 with UTI.  Pharmacy has been consulted for  cefepime dosing.  Plan: Vancomycin 1750 mg IV x 1 dose. Vancomycin 1500 mg IV every 24 hours. Monitor labs, c/s, and patient improvement.     Temp (24hrs), Avg:97.7 F (36.5 C), Min:97.7 F (36.5 C), Max:97.7 F (36.5 C)  Recent Labs  Lab 02/01/21 1028 02/02/21 1307  WBC 16.8* 16.4*  CREATININE 0.83 0.93  LATICACIDVEN  --  1.1    Estimated Creatinine Clearance: 69.3 mL/min (by C-G formula based on SCr of 0.93 mg/dL).    Allergies  Allergen Reactions   Naproxen Sodium Hives, Swelling, Rash and Other (See Comments)    "Made the face, lips, and mouth swell- had to go to the E.R."   Pembrolizumab Rash and Other (See Comments)    KEYTRUDA   Chlorhexidine     No central line   Nsaids Hives, Swelling and Rash    Antimicrobials this admission: Vanco 8/31 >> Zosyn 8/31 >> Cefepime 8/31     Microbiology results: 8/31 BCx: pending 8/31 UCx: pending   Thank you for allowing pharmacy to be a part of this patient's care.  Ramond Craver 02/02/2021 3:41 PM

## 2021-02-02 NOTE — Assessment & Plan Note (Signed)
Stable. Add SSI.

## 2021-02-02 NOTE — Assessment & Plan Note (Signed)
Verified with pt and pt's wife [name] that pt is a DNR/DNI.

## 2021-02-02 NOTE — ED Triage Notes (Signed)
Sent to ED today by heart doctor for low bp and abnormal labs.  Pt unsure of what labs

## 2021-02-02 NOTE — Assessment & Plan Note (Addendum)
Unclear if pt's elevated WBC and hypotension from infection or hypovolemia. Pt given IVF bolus in ER along with PRBC transfusion. Pt has been treated as outpatient for persistent UTI with 2 courses of po abx per wife.  Urine cx grew enterococcus and proteus. Was treated with bactrim and then amoxil. Enterococcus was pcn sensitive. Awaiting UA with culture.

## 2021-02-02 NOTE — Assessment & Plan Note (Signed)
Stable. Pt not on ASA or systemic anticoagulants due to anemia. Wife states cardiology did not want to risk bleeding with this patient.

## 2021-02-02 NOTE — Progress Notes (Signed)
Clinical Summary Mr. Hilgers is a 75 y.o.male seen today for follow up of the followingmedical problems.    1. PAF - no recent palpitaitons.  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    - no recent palpitations - compliant with meds   12/2020 admission with sepsis and pneumonia, issues with afib with RVR during admission - was temporarily on amiodarone.  - HRs up to 120s at times, at rest or with activity.    2. Lung cancer stage IIIc/IV (T4, N2, M0/M1 a) non-small cell lung cancer, squamous cell carcinoma.  - followed by oncolgy - just completed another round of radiation     3. 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 Multiple prior transfusions - no recent blood transfusions.   5. Low bp - some recent lightheadness, dizziness Hgb 7.3, pending transfusion - poor appetite. Weight down 23 lbs June 2022. - limited water daily, gatorade about 8 oz, occasional V8 and milk - has had some issues with chronic UTI, just completed abx course  Past Medical History:  Diagnosis Date   Anemia    Cancer ([place])    mass right lung   COPD (chronic obstructive pulmonary disease) ([place])    QUIT SMOKING 30 YRS AGO   Diabetes ([place]) 2018   Dysrhythmia    PAF   Empyema ([place])    History of radiation therapy 08/11/2019 through 08/28/2019   right lung        Dr Gery Pray   HTN (hypertension)    Hyperlipidemia    Neuromuscular disorder ([place])    PONV (postoperative nausea and vomiting)    Psoriatic arthritis ([place]) 2015   Sepsis ([place])    possibly from right port and traveled to eye     Allergies  Allergen Reactions   Naproxen Sodium Hives, Swelling, Rash and Other (See Comments)    "Made the face, lips, and mouth swell- had to go to the E.R."   Pembrolizumab Rash and Other (See Comments)    KEYTRUDA   Chlorhexidine     No central line   Nsaids Hives, Swelling and Rash     Current  Outpatient Medications  Medication Sig Dispense Refill   acetaminophen (TYLENOL) 325 MG tablet Take 2 tablets (650 mg total) by mouth every 6 (six) hours as needed for mild pain, fever or headache. 120 tablet 0   albuterol (VENTOLIN HFA) 108 (90 Base) MCG/ACT inhaler Inhale 2 puffs into the lungs every 6 (six) hours as needed for wheezing or shortness of breath.     amoxicillin (AMOXIL) 500 MG tablet Take 1 tablet (500 mg total) by mouth 3 (three) times daily. 21 tablet 0   bisacodyl (DULCOLAX) 5 MG EC tablet Take 10 mg by mouth daily as needed for moderate constipation.     cyclobenzaprine (FLEXERIL) 10 MG tablet Take 10 mg by mouth at bedtime as needed for muscle spasms.     docusate sodium (COLACE) 100 MG capsule Take 100 mg by mouth at bedtime.     ferrous sulfate 325 (65 FE) MG tablet Take 325 mg by mouth daily with supper.     folic acid (FOLVITE) 1 MG tablet Take 1 mg by mouth every evening.     gabapentin (NEURONTIN) 300 MG capsule Take 600 mg by mouth at bedtime as needed (for neuropathy).     meclizine (ANTIVERT) 25 MG tablet Take 25 mg  by mouth 3 (three) times daily as needed for dizziness.     metFORMIN (GLUCOPHAGE-XR) 500 MG 24 hr tablet Take 500-1,000 mg by mouth See admin instructions. Take 1,000 mg by mouth in the morning and 500 mg at bedtime     methotrexate (RHEUMATREX) 2.5 MG tablet Take 10 mg by mouth every Sunday. Caution:Chemotherapy. Protect from light.     metoprolol tartrate (LOPRESSOR) 50 MG tablet Take 1 tablet (50 mg total) by mouth 2 (two) times daily.     Multiple Vitamins-Minerals (CENTRUM ADULTS PO) Take 1 tablet by mouth daily at 6 (six) AM.     Omega-3 Fatty Acids (FISH OIL) 1000 MG CAPS Take 1,000 mg by mouth daily.      oxyCODONE-acetaminophen (PERCOCET) 10-325 MG tablet Take 1 tablet by mouth every 6 (six) hours as needed for pain.     prednisoLONE acetate (PRED FORTE) 1 % ophthalmic suspension Place 1 drop into the left eye in the morning and at bedtime.      sulfamethoxazole-trimethoprim (BACTRIM DS) 800-160 MG tablet Take 1 tablet by mouth 2 (two) times daily. 14 tablet 0   tamsulosin (FLOMAX) 0.4 MG CAPS capsule Take 0.4 mg by mouth daily.     traMADol (ULTRAM) 50 MG tablet Take 50 mg by mouth every 4 (four) hours as needed for moderate pain.     No current facility-administered medications for this visit.   Facility-Administered Medications Ordered in Other Visits  Medication Dose Route Frequency Provider Last Rate Last Admin   0.9 %  sodium chloride infusion   Intravenous Continuous Eppie Gibson, MD         Past Surgical History:  Procedure Laterality Date   BACK SURGERY  1990   BIOPSY  12/30/2018   Procedure: BIOPSY;  Surgeon: Danie Binder, MD;  [facility];  Service: Endoscopy;;  gastric   CATARACT EXTRACTION Bilateral    CERVICAL SPINE SURGERY  1995   COLONOSCOPY WITH PROPOFOL N/A 12/30/2018   Procedure: COLONOSCOPY WITH PROPOFOL;  Surgeon: Danie Binder, MD;  [facility];  Service: Endoscopy;  Laterality: N/A;  12:30pm   ESOPHAGOGASTRODUODENOSCOPY (EGD) WITH PROPOFOL N/A 12/30/2018   Procedure: ESOPHAGOGASTRODUODENOSCOPY (EGD) WITH PROPOFOL;  Surgeon: Danie Binder, MD;  [facility];  Service: Endoscopy;  Laterality: N/A;   EYE SURGERY     detached retina   IR IMAGING GUIDED PORT INSERTION  09/04/2019   LUMBAR LAMINECTOMY/ DECOMPRESSION WITH MET-RX Right 08/30/2020   Procedure: Right Lumbar three-four Microdiscectomy;  Surgeon: Kristeen Miss, MD;  Location: [facility];  Service: Neurosurgery;  Laterality: Right;   POLYPECTOMY  12/30/2018   Procedure: POLYPECTOMY;  Surgeon: Danie Binder, MD;  [facility];  Service: Endoscopy;;  colon   THORACENTESIS Right 08/08/2019   Procedure: Thoracentesis;  Surgeon: [name], MD;  Location: [place];  Service: Pulmonary;  Laterality: Right;   VIDEO BRONCHOSCOPY Right 08/08/2019   Procedure: Video Bronchoscopy with Erbe Cryo Biopsy of Right  mainstem;  Surgeon: [name], MD;  [facility];  Service: Pulmonary;  Laterality: Right;     Allergies  Allergen Reactions   Naproxen Sodium Hives, Swelling, Rash and Other (See Comments)    "Made the face, lips, and mouth swell- had to go to the E.R."   Pembrolizumab Rash and Other (See Comments)    KEYTRUDA   Chlorhexidine     No central line   Nsaids Hives, Swelling and Rash  Family History  Problem Relation Age of Onset   Alcoholism Father    CAD Brother    Diabetes Brother    Colon cancer Neg Hx    Colon polyps Neg Hx    Gastric cancer Neg Hx      Social History Mr. Velasques reports that he quit smoking about 29 years ago. His smoking use included cigarettes. He has a 25.00 pack-year smoking history. He has never used smokeless tobacco. Mr. Blais reports current alcohol use.   Review of Systems CONSTITUTIONAL: No weight loss, fever, chills, weakness or fatigue.  HEENT: Eyes: No visual loss, blurred vision, double vision or yellow sclerae.No hearing loss, sneezing, congestion, runny nose or sore throat.  SKIN: No rash or itching.  CARDIOVASCULAR: per hpi RESPIRATORY: +SOB GASTROINTESTINAL: No anorexia, nausea, vomiting or diarrhea. No abdominal pain or blood.  GENITOURINARY: No burning on urination, no polyuria NEUROLOGICAL: No headache, dizziness, syncope, paralysis, ataxia, numbness or tingling in the extremities. No change in bowel or bladder control.  MUSCULOSKELETAL: No muscle, back pain, joint pain or stiffness.  LYMPHATICS: No enlarged nodes. No history of splenectomy.  PSYCHIATRIC: No history of depression or anxiety.  ENDOCRINOLOGIC: No reports of sweating, cold or heat intolerance. No polyuria or polydipsia.  Marland Kitchen   Physical Examination Today's Vitals   02/02/21 0907 02/02/21 0918  BP: (!) 70/56 (!) 72/50  Pulse: (!) 104   SpO2: 95%   Weight: 162 lb 6.4 oz (73.7 kg)   Height: '6\' 2"'  (1.88 m)    Body mass index is 20.85 kg/m.  Gen: resting  comfortably, no acute distress HEENT: no scleral icterus, pupils equal round and reactive, no palptable cervical adenopathy,  CV: irreg, no m/g/ no jvd Resp: Clear to auscultation bilaterally GI: abdomen is soft, non-tender, non-distended, normal bowel sounds, no hepatosplenomegaly MSK: extremities are warm, no edema.  Skin: warm, no rash Neuro:  no focal deficits Psych: appropriate affect   Diagnostic Studies  10/2019 echo 1. Left ventricular ejection fraction, by estimation, is 60 to 65%. The  left ventricle has normal function. Left ventricular endocardial border  not optimally defined to evaluate regional wall motion. Left ventricular  diastolic parameters are consistent  with Grade I diastolic dysfunction (impaired relaxation).   2. Right ventricular systolic function is normal. The right ventricular  size is moderately enlarged.   3. Right atrial size was mild to moderately dilated.   4. The mitral valve was not well visualized. Trivial mitral valve  regurgitation.   5. Tricuspid valve regurgitation unable to assess.   6. The aortic valve was not well visualized. Aortic valve regurgitation  is mild.   7. Pulmonic valve regurgitation unable to assess.   8. Aortic dilatation noted. There is mild dilatation of the aortic root.      02/2020 TEE [facility] SUMMARY  No valvular vegetation present.  Diffuse thickening of the aortic valve with preserved cusp opening.  There is mild aortic regurgitation.  Structurally normal tricuspid valve. There is mild tricuspid  regurgitation. There is mild tricuspid valve prolapse.  Left ventricular systolic function is normal. The left atrial size is  normal.  No thrombus is detected in the left atrial appendage.  Right atrial size is normal. A patent foramen ovale is present.  There is no comparison study available.    02/2020 TTE  SUMMARY  The left ventricular size is normal. Left ventricular systolic  function is normal. LV ejection  fraction = 55-60%. Unable to fully  assess LV regional wall  motion.   The right ventricle is normal in size and function.   Off axis views of the mitral and tricupsid valve wereobtained dueto  suboptimal iamge quality from on axis views. Thickened tricuspid  leaflets noted, no significant tricuspid regurgitation. Unable to  exclude a vegetation.   Mitral leaflets open well, remaining two valves are not well  visualized.     Assessment and Plan   1. PAF - recent high heart rates at home, palpitations - will obtain a zio patch to further evaluate 72 hr - if bp's tolerate continue lopressor. Could consider adding digoxin for additional rate control is bp's an issue. Was transiently on amio durign July admission with afib with RVR but some concern about chemic conversion without being able to be on anticoag due to chronc transfusion depending anemia and amio was stopped.   2. Hypotension - manual repeat bp 80/50 - reports poor oral intake, 23 lbs weight loss since June. Unclear if due to hypovolemia, failure to thrive. WBC of 17 yesterday, has had recurrent issues with UTIs and recent pneumonia with sepsis in July, unclear if could be a component of sepsis. Chronic anemia Hgb down to 7.3, if hemoconcentrated Hgb may be even lower, unclear if severe anemia is playing a role  - patient has agreed for evaluation at [facility], ER physician Dr Vanita Panda made aware.May requrie medicine admission, if issues with his arrhythmia during a potential admission would need to consult cards at that time.    Arnoldo Lenis, M.D.

## 2021-02-02 NOTE — Subjective & Objective (Signed)
CC: sent from cardiology office due to low BP HPI: 75 year old male with a history of metastatic squamous cell lung cancer, atrial fibrillation, COPD, chronic hypoxic respiratory failure, chronic anemia, type 2 diabetes who was sent over to the ER today from the cardiology office.  Patient had to be hypotensive in the cardiology office.  Patient recently completed external beam radiation for his squamous cell lung cancer.  Patient is no longer getting chemotherapy.  Wife is under the impression that the patient was undergoing curative radiation therapy.  Patient had a CT scan yesterday which demonstrated worsening of his right lower lobe disease process with worsening lymphangitic spread of his cancer.  Patient also recently treated with 2 rounds of antibiotics including Bactrim and then amoxicillin for recurrent UTI with Proteus and Enterococcus.  Wife provides majority history is a patient defers to her.  I did discuss the case over the phone with the wife.  Wife states over the last couple days the patient's not been feeling well.  Has been planing of more shortness of breath.  Patient denies any diarrhea or abdominal pain.  Wife is unclear if he had any fevers.  He has had worsening shortness of breath and a cough.  He has been using his oxygen more so the last couple days.  Patient transported the ER from the cardiology office.  In ER, patient noted to be afebrile with a temperature 97.7.  Heart rate is 105.  Blood pressure 89/69.  Patient started on a fluid bolus.  Left for evaluation white count was 16.4.  Hemoglobin 5.8 g/dL.  Patient started on a blood transfusion.  Due to patient's hypotension, anemia and possible sepsis, [facility] hospitalist contacted for admission.

## 2021-02-02 NOTE — ED Provider Notes (Signed)
[facility] EMERGENCY DEPARTMENT Provider Note   CSN: [id number] Arrival date & time: 02/02/21  1042     History No chief complaint on file.   Jeremy [name] is a 75 y.o. 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  Patient was treated for UTI with Bactrim and Cipro recently and had recently completed Cipro.  Pending repeat urine culture per family medicine.  Presents today with confusion, weakness, and fatigue.  His wife is at the bedside, states he has been having recurring fevers at nighttime with associated hallucinations for the last month, she is concerned is related to urinary tract infection, though no clear cause has been identified in the past.   I personally reviewed the patient's medical records in addition to his malignancy has COPD, atrial fibrillation, and history of sepsis secondary to infected port.  Additionally was recently admitted in July of this year for sepsis related to pneumonia.  Patient underwent CT imaging of the chest, abdomen and pelvis yesterday which revealed significant interval increase in right perihilar lung malignancy with significant postobstructive airspace disease levels pleural lymphangitic involvement.  No overt pneumonia or fluid collection.  HPI     Past Medical History:  Diagnosis Date   Anemia    Cancer ([place])    mass right lung   COPD (chronic obstructive pulmonary disease) ([place])    QUIT SMOKING 30 YRS AGO   Diabetes ([place]) 2018   Dysrhythmia    PAF   Empyema ([place])    History of radiation therapy 08/11/2019 through 08/28/2019   right lung        Dr Gery Pray   HTN (hypertension)    Hyperlipidemia    Neuromuscular disorder ([place])    PONV (postoperative nausea and vomiting)    Psoriatic arthritis ([place]) 2015   Sepsis ([place])    possibly from right port and traveled to  eye    Patient Active Problem List   Diagnosis Date Noted   Severe sepsis with lactic acidosis ([place]) 11/30/2020   Chronic respiratory failure with hypoxia ([place]) 11/30/2020   SIRS (systemic inflammatory response syndrome) ([place]) 10/21/2020   Herniated nucleus pulposus, L3-4 08/30/2020   Recurrent right pleural effusion    Advanced care planning/counseling discussion    Palliative care by specialist    Palliative care encounter    Loculated pleural effusion 11/08/2019   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/08/2019   Port-A-Cath in place 11/05/2019   Pleural effusion 11/05/2019   Sepsis due to undetermined organism ([place]) 10/19/2019   Thrombocytopenia ([place]) 10/19/2019   Failure to thrive in adult 10/16/2019   Generalized weakness 10/16/2019   Drug-induced skin rash 09/23/2019   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[place]) 08/26/2019   Encounter for antineoplastic chemotherapy 08/26/2019   Encounter for antineoplastic immunotherapy 08/26/2019   Goals of care, counseling/discussion 08/26/2019   AF (paroxysmal atrial fibrillation) ([place])    Protein-calorie malnutrition, severe 08/07/2019   Hemoptysis 08/06/2019   Mass of right lung 08/06/2019   Former smoker 08/06/2019   Lung mass 08/06/2019   Postobstructive pneumonia 08/06/2019   COPD (chronic obstructive pulmonary disease) (H[facility])    HTN (hypertension)    Abnormal weight loss    Leukocytosis    Acute and chronic respiratory failure with hypoxia (H[facility])    Anemia in neoplastic disease    Hyperglycemia    Heme + stool  11/06/2018   Type 2 diabetes mellitus ([place]) 2018    Past Surgical History:  Procedure Laterality Date   BACK SURGERY  1990   BIOPSY  12/30/2018   Procedure: BIOPSY;  Surgeon: Danie Binder, MD;  [facility];  Service: Endoscopy;;  gastric   CATARACT EXTRACTION Bilateral    CERVICAL SPINE SURGERY  1995   COLONOSCOPY WITH PROPOFOL N/A 12/30/2018   Procedure: COLONOSCOPY WITH PROPOFOL;  Surgeon:  Danie Binder, MD;  [facility];  Service: Endoscopy;  Laterality: N/A;  12:30pm   ESOPHAGOGASTRODUODENOSCOPY (EGD) WITH PROPOFOL N/A 12/30/2018   Procedure: ESOPHAGOGASTRODUODENOSCOPY (EGD) WITH PROPOFOL;  Surgeon: Danie Binder, MD;  [facility];  Service: Endoscopy;  Laterality: N/A;   EYE SURGERY     detached retina   IR IMAGING GUIDED PORT INSERTION  09/04/2019   LUMBAR LAMINECTOMY/ DECOMPRESSION WITH MET-RX Right 08/30/2020   Procedure: Right Lumbar three-four Microdiscectomy;  Surgeon: Kristeen Miss, MD;  Location: [place];  Service: Neurosurgery;  Laterality: Right;   POLYPECTOMY  12/30/2018   Procedure: POLYPECTOMY;  Surgeon: Danie Binder, MD;  [facility];  Service: Endoscopy;;  colon   THORACENTESIS Right 08/08/2019   Procedure: Thoracentesis;  Surgeon: [name], MD;  Location: [place];  Service: Pulmonary;  Laterality: Right;   VIDEO BRONCHOSCOPY Right 08/08/2019   Procedure: Video Bronchoscopy with Erbe Cryo Biopsy of Right mainstem;  Surgeon: [name], MD;  Location: [facility] OR;  Service: Pulmonary;  Laterality: Right;       Family History  Problem Relation Age of Onset   Alcoholism Father    CAD Brother    Diabetes Brother    Colon cancer Neg Hx    Colon polyps Neg Hx    Gastric cancer Neg Hx     Social History   Tobacco Use   Smoking status: Former    Packs/day: 0.50    Years: 50.00    Pack years: 25.00    Types: Cigarettes    Quit date: 12/26/1991    Years since quitting: 29.1   Smokeless tobacco: Never  Vaping Use   Vaping Use: Never used  Substance Use Topics   Alcohol use: Yes    Comment: occ beer   Drug use: Never    Home Medications Prior to Admission medications   Medication Sig Start Date End Date Taking? Authorizing Provider  acetaminophen (TYLENOL) 325 MG tablet Take 2 tablets (650 mg total) by mouth every 6 (six) hours as needed for mild pain, fever or headache. 10/28/19  Yes Emokpae, Courage, MD   albuterol (VENTOLIN HFA) 108 (90 Base) MCG/ACT inhaler Inhale 2 puffs into the lungs every 6 (six) hours as needed for wheezing or shortness of breath.   Yes [provider]  bisacodyl (DULCOLAX) 5 MG EC tablet Take 10 mg by mouth daily as needed for moderate constipation.   Yes [provider]  cyclobenzaprine (FLEXERIL) 10 MG tablet Take 10 mg by mouth at bedtime as needed for muscle spasms. 11/23/19  Yes [provider]  docusate sodium (COLACE) 100 MG capsule Take 100 mg by mouth at bedtime.   Yes [provider]  ferrous sulfate 325 (65 FE) MG tablet Take 325 mg by mouth daily with supper.   Yes [provider]  folic acid (FOLVITE) 1 MG tablet Take 1 mg by mouth every evening.   Yes [provider]  gabapentin (NEURONTIN) 300 MG capsule Take 600 mg by  mouth at bedtime as needed (for neuropathy).   Yes [provider]  meclizine (ANTIVERT) 25 MG tablet Take 25 mg by mouth 3 (three) times daily as needed for dizziness.   Yes [provider]  metFORMIN (GLUCOPHAGE-XR) 500 MG 24 hr tablet Take 500-1,000 mg by mouth See admin instructions. Take 1,000 mg by mouth in the morning and 500 mg at bedtime 10/15/18  Yes [provider]  methotrexate (RHEUMATREX) 2.5 MG tablet Take 10 mg by mouth every Sunday. Caution:Chemotherapy. Protect from light.   Yes [provider]  metoprolol tartrate (LOPRESSOR) 50 MG tablet Take 1 tablet (50 mg total) by mouth 2 (two) times daily. 02/02/21  Yes BranchAlphonse Guild, MD  Multiple Vitamins-Minerals (CENTRUM ADULTS PO) Take 1 tablet by mouth daily at 6 (six) AM.   Yes [provider]  Omega-3 Fatty Acids (FISH OIL) 1000 MG CAPS Take 1,000 mg by mouth daily.    Yes [provider]  oxyCODONE-acetaminophen (PERCOCET) 10-325 MG tablet Take 1 tablet by mouth every 6 (six) hours as needed for pain.   Yes [provider]  tamsulosin (FLOMAX) 0.4 MG CAPS capsule  Take 0.4 mg by mouth daily. 03/16/20  Yes [provider]  traMADol (ULTRAM) 50 MG tablet Take 50 mg by mouth every 4 (four) hours as needed for moderate pain. 08/19/18  Yes [provider]    Allergies    Naproxen sodium, Pembrolizumab, Chlorhexidine, and Nsaids  Review of Systems   Review of Systems  Constitutional:  Positive for activity change, appetite change, chills and fatigue. Negative for fever.  HENT: Negative.    Eyes: Negative.   Respiratory:  Positive for shortness of breath.   Cardiovascular: Negative.   Gastrointestinal: Negative.   Genitourinary:  Positive for frequency. Negative for decreased urine volume, difficulty urinating, dysuria, flank pain and urgency.  Musculoskeletal: Negative.   Neurological:  Positive for weakness.  Hematological: Negative.    Physical Exam Updated Vital Signs BP 99/61 (BP Location: Right Arm)   Pulse (!) 105   Temp 97.7 F (36.5 C) (Oral)   Resp (!) 25   SpO2 97%   Physical Exam Vitals and nursing note reviewed.  Constitutional:      Appearance: He is underweight. He is ill-appearing. He is not toxic-appearing.  HENT:     Head: Normocephalic and atraumatic.     Nose: Nose normal. No congestion.     Mouth/Throat:     Mouth: Mucous membranes are moist.     Pharynx: Oropharynx is clear. Uvula midline. No oropharyngeal exudate or posterior oropharyngeal erythema.     Tonsils: No tonsillar exudate.  Eyes:     General: Lids are normal. Vision grossly intact.        Right eye: No discharge.        Left eye: No discharge.     Extraocular Movements: Extraocular movements intact.     Conjunctiva/sclera: Conjunctivae normal.     Pupils: Pupils are equal, round, and reactive to light.  Neck:     Trachea: Trachea and phonation normal.  Cardiovascular:     Rate and Rhythm: Normal rate and regular rhythm.     Pulses: Normal pulses.     Heart sounds: Normal heart sounds. No murmur heard. Pulmonary:     Effort:  Tachypnea and accessory muscle usage present. No respiratory distress.     Breath sounds: Normal breath sounds. Decreased air movement present. No wheezing or rales.  Chest:     Chest  wall: No mass, lacerations, deformity, swelling, tenderness, crepitus or edema.    Abdominal:     General: Bowel sounds are normal. There is no distension.     Palpations: Abdomen is soft.     Tenderness: There is no abdominal tenderness. There is no right CVA tenderness, left CVA tenderness, guarding or rebound.  Musculoskeletal:        General: No deformity.     Cervical back: Normal range of motion and neck supple. No edema, rigidity or crepitus. No pain with movement, spinous process tenderness or muscular tenderness.     Right lower leg: No edema.     Left lower leg: No edema.  Lymphadenopathy:     Cervical: No cervical adenopathy.  Skin:    General: Skin is warm and dry.     Capillary Refill: Capillary refill takes less than 2 seconds.     Coloration: Skin is pale.  Neurological:     General: No focal deficit present.     Mental Status: He is alert and oriented to person, place, and time. Mental status is at baseline.     GCS: GCS eye subscore is 4. GCS verbal subscore is 5. GCS motor subscore is 6.  Psychiatric:        Mood and Affect: Mood normal.    ED Results / Procedures / Treatments   Labs (all labs ordered are listed, but only abnormal results are displayed) Labs Reviewed - No data to display  EKG EKG:  sinus tachycardia borderline prolonged QT without STEMI..   Radiology CT Chest W Contrast  Result Date: 02/02/2021 CLINICAL DATA:  Primary Cancer Type: Lung Imaging Indication: Assess response to therapy Interval therapy since last imaging? Yes Initial Cancer Diagnosis Date: 08/08/2019; Established by: Biopsy-proven Detailed Pathology: Stage IIIC/IV non-small cell lung cancer, squamous cell carcinoma. Primary Tumor location: Initial right lower lobe. New/enlarging left lower lobe  lesion. Surgeries: Microdiscectomy right L3-4 08/30/2020. Chemotherapy: Yes; Ongoing? No; Most recent administration: 01/21/2020 Immunotherapy?  Yes; Type: Keytruda; Ongoing? No Radiation therapy? Yes Date Range: 11/23/2020 - 01/27/2021; Target: Left lung Date Range: 08/11/2019 - 08/28/2019; Target: Right lung EXAM: CT CHEST WITH CONTRAST TECHNIQUE: Multidetector CT imaging of the chest was performed during intravenous contrast administration. CONTRAST:  11m OMNIPAQUE IOHEXOL 350 MG/ML SOLN COMPARISON:  CT chest PE study, 12/01/2020, 10/15/2020 PET-CT. CT chest, abdomen and pelvis 10/04/2020. FINDINGS: CT CHEST FINDINGS Cardiovascular: Aortic atherosclerosis. Normal heart size. Left coronary artery calcifications. No pericardial effusion. Mediastinum/Nodes: No enlarged mediastinal, hilar, or axillary lymph nodes. Thyroid gland, trachea, and esophagus demonstrate no significant findings. Lungs/Pleura: Unchanged moderate, loculated right pleural effusion with extensive associated pleural thickening. Redemonstrated post treatment fibrosis and consolidation of the perihilar right lung and right lower lobe. There has been a very substantial increase in dense consolidation and volume loss of the right lung, particular in the posterior right upper lobe (series 5, image 58). There is a corresponding increase in degree of obstruction of the right lobar bronchi. Multiple new nodules in the aerated portion of the lung, for example a 1.7 x 1.0 cm nodule of the peripheral right upper lobe (series 5, image 83). There is interlobular septal thickening throughout the aerated portions the right lung. Redemonstrated cavitary lesion of the superior segment left lower lobe, which is significantly diminished in size and with a greater volume of internal air, measuring approximately 4.3 x 3.8 cm, previously 7.0 x 5.1 cm when measured similarly (series 5, image 85). There is bronchial wall thickening and plugging in  this vicinity. There  are new, clustered nodules in the dependent left lower lobe inferior to this lesion (series 5, image 114) and a new subpleural nodule of the superior segment left lower lobe measuring 1.2 x 0.9 cm (series 5, image 49). An irregular nodule of the peripheral left upper lobe with associated pleural tenting is unchanged, measuring 0.9 x 0.7 cm (series 5, image 55). Musculoskeletal: No chest wall mass or suspicious bone lesions identified. CT ABDOMEN PELVIS FINDINGS Hepatobiliary: No solid liver abnormality is seen. No gallstones, gallbladder wall thickening, or biliary dilatation. Pancreas: Unremarkable. No pancreatic ductal dilatation or surrounding inflammatory changes. Spleen: Normal in size without significant abnormality. Adrenals/Urinary Tract: Adrenal glands are unremarkable. Kidneys are normal, without renal calculi, solid lesion, or hydronephrosis. Bladder is unremarkable. Stomach/Bowel: Stomach is within normal limits. Small incidental diverticulum of the transverse portion of the duodenum. Appendix appears normal. No evidence of bowel wall thickening, distention, or inflammatory changes. Descending and sigmoid diverticulosis. Large burden of stool throughout the colon rectum. Vascular/Lymphatic: Aortic atherosclerosis. No enlarged abdominal or pelvic lymph nodes. Reproductive: No mass or other abnormality. Other: No abdominal wall hernia or abnormality. No abdominopelvic ascites. Musculoskeletal: No acute or significant osseous findings. IMPRESSION: 1. Redemonstrated post treatment fibrosis and consolidation of the perihilar right lung and right lower lobe. There has been a very substantial increase in dense consolidation and volume loss of the right lung, particularly in the posterior right upper lobe. There is a corresponding increase in degree of obstruction of the right lobar bronchi. Interlobular septal thickening throughout the aerated portions of the right lung. Findings are consistent with locally  worsened perihilar right lung malignancy with significant postobstructive airspace disease as well as pleural and lymphangitic involvement. 2. Multiple new bilateral pulmonary nodules, most consistent with pulmonary parenchymal metastatic disease. 3. Redemonstrated cavitary lesion of the superior segment left lower lobe, which is significantly diminished in size and with a greater volume of internal air, consistent with treatment response. 4. Overall constellation of findings is consistent with mixed response to treatment. 5. An irregular nodule of the peripheral left upper lobe with associated pleural tenting is unchanged, nonspecific. Attention on follow-up. 6. No evidence of distant metastatic disease in the abdomen pelvis. Aortic Atherosclerosis (ICD10-I70.0). Electronically Signed   By: Eddie Candle M.D.   On: 02/02/2021 10:01   CT Abdomen Pelvis W Contrast  Result Date: 02/02/2021 CLINICAL DATA:  Primary Cancer Type: Lung Imaging Indication: Assess response to therapy Interval therapy since last imaging? Yes Initial Cancer Diagnosis Date: 08/08/2019; Established by: Biopsy-proven Detailed Pathology: Stage IIIC/IV non-small cell lung cancer, squamous cell carcinoma. Primary Tumor location: Initial right lower lobe. New/enlarging left lower lobe lesion. Surgeries: Microdiscectomy right L3-4 08/30/2020. Chemotherapy: Yes; Ongoing? No; Most recent administration: 01/21/2020 Immunotherapy?  Yes; Type: Keytruda; Ongoing? No Radiation therapy? Yes Date Range: 11/23/2020 - 01/27/2021; Target: Left lung Date Range: 08/11/2019 - 08/28/2019; Target: Right lung EXAM: CT CHEST WITH CONTRAST TECHNIQUE: Multidetector CT imaging of the chest was performed during intravenous contrast administration. CONTRAST:  48m OMNIPAQUE IOHEXOL 350 MG/ML SOLN COMPARISON:  CT chest PE study, 12/01/2020, 10/15/2020 PET-CT. CT chest, abdomen and pelvis 10/04/2020. FINDINGS: CT CHEST FINDINGS Cardiovascular: Aortic atherosclerosis. Normal  heart size. Left coronary artery calcifications. No pericardial effusion. Mediastinum/Nodes: No enlarged mediastinal, hilar, or axillary lymph nodes. Thyroid gland, trachea, and esophagus demonstrate no significant findings. Lungs/Pleura: Unchanged moderate, loculated right pleural effusion with extensive associated pleural thickening. Redemonstrated post treatment fibrosis and consolidation of the perihilar right lung and right lower  lobe. There has been a very substantial increase in dense consolidation and volume loss of the right lung, particular in the posterior right upper lobe (series 5, image 58). There is a corresponding increase in degree of obstruction of the right lobar bronchi. Multiple new nodules in the aerated portion of the lung, for example a 1.7 x 1.0 cm nodule of the peripheral right upper lobe (series 5, image 83). There is interlobular septal thickening throughout the aerated portions the right lung. Redemonstrated cavitary lesion of the superior segment left lower lobe, which is significantly diminished in size and with a greater volume of internal air, measuring approximately 4.3 x 3.8 cm, previously 7.0 x 5.1 cm when measured similarly (series 5, image 85). There is bronchial wall thickening and plugging in this vicinity. There are new, clustered nodules in the dependent left lower lobe inferior to this lesion (series 5, image 114) and a new subpleural nodule of the superior segment left lower lobe measuring 1.2 x 0.9 cm (series 5, image 49). An irregular nodule of the peripheral left upper lobe with associated pleural tenting is unchanged, measuring 0.9 x 0.7 cm (series 5, image 55). Musculoskeletal: No chest wall mass or suspicious bone lesions identified. CT ABDOMEN PELVIS FINDINGS Hepatobiliary: No solid liver abnormality is seen. No gallstones, gallbladder wall thickening, or biliary dilatation. Pancreas: Unremarkable. No pancreatic ductal dilatation or surrounding inflammatory changes.  Spleen: Normal in size without significant abnormality. Adrenals/Urinary Tract: Adrenal glands are unremarkable. Kidneys are normal, without renal calculi, solid lesion, or hydronephrosis. Bladder is unremarkable. Stomach/Bowel: Stomach is within normal limits. Small incidental diverticulum of the transverse portion of the duodenum. Appendix appears normal. No evidence of bowel wall thickening, distention, or inflammatory changes. Descending and sigmoid diverticulosis. Large burden of stool throughout the colon rectum. Vascular/Lymphatic: Aortic atherosclerosis. No enlarged abdominal or pelvic lymph nodes. Reproductive: No mass or other abnormality. Other: No abdominal wall hernia or abnormality. No abdominopelvic ascites. Musculoskeletal: No acute or significant osseous findings. IMPRESSION: 1. Redemonstrated post treatment fibrosis and consolidation of the perihilar right lung and right lower lobe. There has been a very substantial increase in dense consolidation and volume loss of the right lung, particularly in the posterior right upper lobe. There is a corresponding increase in degree of obstruction of the right lobar bronchi. Interlobular septal thickening throughout the aerated portions of the right lung. Findings are consistent with locally worsened perihilar right lung malignancy with significant postobstructive airspace disease as well as pleural and lymphangitic involvement. 2. Multiple new bilateral pulmonary nodules, most consistent with pulmonary parenchymal metastatic disease. 3. Redemonstrated cavitary lesion of the superior segment left lower lobe, which is significantly diminished in size and with a greater volume of internal air, consistent with treatment response. 4. Overall constellation of findings is consistent with mixed response to treatment. 5. An irregular nodule of the peripheral left upper lobe with associated pleural tenting is unchanged, nonspecific. Attention on follow-up. 6. No  evidence of distant metastatic disease in the abdomen pelvis. Aortic Atherosclerosis (ICD10-I70.0). Electronically Signed   By: Eddie Candle M.D.   On: 02/02/2021 10:01    Procedures .Critical Care  Date/Time: 02/02/2021 5:51 PM Performed by: Emeline Darling, PA-C Authorized by: Emeline Darling, PA-C   Critical care provider statement:    Critical care time (minutes):  45   Critical care was necessary to treat or prevent imminent or life-threatening deterioration of the following conditions:  Sepsis (Symptomatic anemia, Hbg 5.8)   Critical care was time spent personally  by me on the following activities:  Discussions with consultants, evaluation of patient's response to treatment, examination of patient, ordering and performing treatments and interventions, ordering and review of laboratory studies, ordering and review of radiographic studies, pulse oximetry, re-evaluation of patient's condition, obtaining history from patient or surrogate and review of old charts   Medications Ordered in ED Medications - No data to display  ED Course  I have reviewed the triage vital signs and the nursing notes.  Pertinent labs & imaging results that were available during my care of the patient were reviewed by me and considered in my medical decision making (see chart for details).  Clinical Course as of 02/02/21 1749  Wed Feb 02, 2021  1558 Consult to hospitalist, Dr. [name], was agreeable to seeing this patient and admitting him to his service.  Appreciate his collaboration in the care of this patient. [RS]    Clinical Course User Index [RS] Avary Pitsenbarger, Sharlene Dory   MDM Rules/Calculators/A&P                         75 year old male who presents with concern for weakness and elevated white blood cell count  at the cardiologist as well as low blood pressure.  The differential diagnosis of weakness includes but is not limited to: Neurologic causes: GBS, myasthenia gravis, CVA, MS,  ALS, transverse myelitis, spinal cord injury, CVA, botulism Other causes: ACS, Arrhythmia, syncope, orthostatic hypotension, sepsis, hypoglycemia, electrolyte disturbance, hypothyroidism, respiratory failure, symptomatic anemia, dehydration, heat injury, polypharmacy, malignancy.  Mildly tachycardic and borderline hypotensive on intake, tachypneic on 2 L submental oxygen by nasal cannula, which is his baseline.  Cardiac exam revealed borderline tachycardia, no murmur.  Pulmonary exam with tachypnea, decreased breath sounds in the right lung, and accessory muscle use.  Abdominal exam is benign.  Skin exam is benign.  Patient does appear pale.  CBC with leukocytosis of 16.4, critical anemia result: That is provided by RN, hemoglobin of 5.8, previously 7.  CMP unremarkable, lactic acid is normal, 1.1.  INR normal.  Respiratory pathogen panel is negative.  Patient unable to provide urine specimen up to this point.  Will cover for UTI with cefepime given recent antibiotic use.  Blood transfusion ordered for this patient after discussion with the patient and his wife who voiced understanding of the risks versus benefits of transfusion and would like to proceed with transfusion at this time.   Patient had heart monitor placed on the chest today at the cardiologist office, which cannot undergo radiation.  For this reason we will hold off on chest x-ray today, no pneumonia seen on CT yesterday.   UA with proteinuria, few bacteria. Urine cx pending.  Consult to hospitalist as above, Dr. [name], was agreeable to admitting this patient for sepsis and symptomatic anemia.  Appreciate his collaboration in the care of this patient.  Jameison and his wife voiced understanding of his medical evaluation and treatment plan.  Each of their questions was answered to their expressed satisfaction. He is amenable to plan for admission at this time.   This chart was dictated using voice recognition software, Dragon. Despite the  best efforts of this provider to proofread and correct errors, errors may still occur which can change documentation meaning.  Final Clinical Impression(s) / ED Diagnoses Final diagnoses:  None    Rx / DC Orders ED Discharge Orders     None        Traxton Kolenda R, PA-C  02/02/21 1752    Carmin Muskrat, MD 02/03/21 [phone]

## 2021-02-02 NOTE — Assessment & Plan Note (Signed)
Suspect that hypotension related to hypovolemia. Pt receiving PRBC now and completed IVF boluses. If pt still hypotensive after PRBC transfusion, pt may need peripheral levophed to support blood pressure.

## 2021-02-02 NOTE — Assessment & Plan Note (Signed)
Verified with pt and wife that pt is on 2 L/min at home.

## 2021-02-02 NOTE — Patient Instructions (Addendum)
Medication Instructions:  Your physician recommends that you continue on your current medications as directed. Please refer to the Current Medication list given to you today.  Labwork: none  Testing/Procedures: ZIO- Long Term Monitor Instructions   Your physician has requested you wear your ZIO patch monitor 3 days.   This is a single patch monitor.  [employer] supplies one patch monitor per enrollment.  Additional stickers are not available.   Please do not apply patch if you will be having a Nuclear Stress Test, Echocardiogram, Cardiac CT, MRI, or Chest Xray during the time frame you would be wearing the monitor. The patch cannot be worn during these tests.  You cannot remove and re-apply the ZIO XT patch monitor.     Once you have received you monitor, please review enclosed instructions.  Your monitor has already been registered assigning a specific monitor serial # to you.   Applying the monitor   Shave hair from upper left chest.   Hold abrader disc by orange tab.  Rub abrader in 40 strokes over left upper chest as indicated in your monitor instructions.   Clean area with 4 enclosed alcohol pads .  Use all pads to assure are is cleaned thoroughly.  Let dry.   Apply patch as indicated in monitor instructions.  Patch will be place under collarbone on left side of chest with arrow pointing upward.   Rub patch adhesive wings for 2 minutes.Remove white label marked "1".  Remove white label marked "2".  Rub patch adhesive wings for 2 additional minutes.   While looking in a mirror, press and release button in center of patch.  A small green light will flash 3-4 times .  This will be your only indicator the monitor has been turned on.     Do not shower for the first 24 hours.  You may shower after the first 24 hours.   Press button if you feel a symptom. You will hear a small click.  Record Date, Time and Symptom in the Patient Log Book.   When you are ready to remove patch, follow  instructions on last 2 pages of Patient Log Book.  Stick patch monitor onto last page of Patient Log Book.   Place Patient Log Book in [place] box.  Use locking tab on box and tape box closed securely.  The [employer] and [employer] has [employer] on it.  Please place in mailbox as soon as possible.  Your physician should have your test results approximately 7 days after the monitor has been mailed back to [facility]   Call [place] at [phone] if you have questions regarding your ZIO XT patch monitor.  Call them immediately if you see an orange light blinking on your monitor.   If your monitor falls off in less than 4 days contact our Monitor department at [phone].  If your monitor becomes loose or falls off after 4 days call [employer] at [phone] for suggestions on securing your monitor.  Follow-Up: Your physician recommends that you schedule a follow-up appointment in: 1 month  Any Other Special Instructions Will Be Listed Below (If Applicable). Go to [facility] ED now for hypotension evaluation  If you need a refill on your cardiac medications before your next appointment, please call your pharmacy.

## 2021-02-03 ENCOUNTER — Ambulatory Visit: Payer: Medicare Other | Admitting: Physician Assistant

## 2021-02-03 DIAGNOSIS — C3491 Malignant neoplasm of unspecified part of right bronchus or lung: Secondary | ICD-10-CM

## 2021-02-03 LAB — BPAM RBC
Blood Product Expiration Date: 202209282359
Blood Product Expiration Date: 202209282359
ISSUE DATE / TIME: 202208311538
ISSUE DATE / TIME: 202208312006
Unit Type and Rh: 6200
Unit Type and Rh: 6200

## 2021-02-03 LAB — CBC WITH DIFFERENTIAL/PLATELET
Abs Immature Granulocytes: 0.12 10*3/uL — ABNORMAL HIGH (ref 0.00–0.07)
Basophils Absolute: 0 10*3/uL (ref 0.0–0.1)
Basophils Relative: 0 %
Eosinophils Absolute: 0.2 10*3/uL (ref 0.0–0.5)
Eosinophils Relative: 2 %
HCT: 23.4 % — ABNORMAL LOW (ref 39.0–52.0)
Hemoglobin: 7.2 g/dL — ABNORMAL LOW (ref 13.0–17.0)
Immature Granulocytes: 1 %
Lymphocytes Relative: 2 %
Lymphs Abs: 0.2 10*3/uL — ABNORMAL LOW (ref 0.7–4.0)
MCH: 30.3 pg (ref 26.0–34.0)
MCHC: 30.8 g/dL (ref 30.0–36.0)
MCV: 98.3 fL (ref 80.0–100.0)
Monocytes Absolute: 0.3 10*3/uL (ref 0.1–1.0)
Monocytes Relative: 3 %
Neutro Abs: 10.9 10*3/uL — ABNORMAL HIGH (ref 1.7–7.7)
Neutrophils Relative %: 92 %
Platelets: 198 10*3/uL (ref 150–400)
RBC: 2.38 MIL/uL — ABNORMAL LOW (ref 4.22–5.81)
RDW: 16.6 % — ABNORMAL HIGH (ref 11.5–15.5)
WBC: 11.8 10*3/uL — ABNORMAL HIGH (ref 4.0–10.5)
nRBC: 0 % (ref 0.0–0.2)

## 2021-02-03 LAB — COMPREHENSIVE METABOLIC PANEL
ALT: 13 U/L (ref 0–44)
AST: 13 U/L — ABNORMAL LOW (ref 15–41)
Albumin: 1.8 g/dL — ABNORMAL LOW (ref 3.5–5.0)
Alkaline Phosphatase: 54 U/L (ref 38–126)
Anion gap: 4 — ABNORMAL LOW (ref 5–15)
BUN: 15 mg/dL (ref 8–23)
CO2: 27 mmol/L (ref 22–32)
Calcium: 7.7 mg/dL — ABNORMAL LOW (ref 8.9–10.3)
Chloride: 101 mmol/L (ref 98–111)
Creatinine, Ser: 0.78 mg/dL (ref 0.61–1.24)
GFR, Estimated: >60 mL/min (ref >60)
Glucose, Bld: 97 mg/dL (ref 70–99)
Potassium: 3.5 mmol/L (ref 3.5–5.1)
Sodium: 132 mmol/L — ABNORMAL LOW (ref 135–145)
Total Bilirubin: 0.5 mg/dL (ref 0.3–1.2)
Total Protein: 6.4 g/dL — ABNORMAL LOW (ref 6.5–8.1)

## 2021-02-03 LAB — GLUCOSE, CAPILLARY
Glucose-Capillary: 169 mg/dL — ABNORMAL HIGH (ref 70–99)
Glucose-Capillary: 98 mg/dL (ref 70–99)
Glucose-Capillary: 139 mg/dL — ABNORMAL HIGH (ref 70–99)
Glucose-Capillary: 98 mg/dL (ref 70–99)
Glucose-Capillary: 106 mg/dL — ABNORMAL HIGH (ref 70–99)

## 2021-02-03 LAB — TYPE AND SCREEN
ABO/RH(D): A POS
Antibody Screen: NEGATIVE
Unit division: 0
Unit division: 0

## 2021-02-03 LAB — MAGNESIUM: Magnesium: 1.6 mg/dL — ABNORMAL LOW (ref 1.7–2.4)

## 2021-02-03 LAB — STREP PNEUMONIAE URINARY ANTIGEN: Strep Pneumo Urinary Antigen: NEGATIVE

## 2021-02-03 LAB — PROCALCITONIN: Procalcitonin: 0.22 ng/mL

## 2021-02-03 LAB — MRSA NEXT GEN BY PCR, NASAL: MRSA by PCR Next Gen: NOT DETECTED

## 2021-02-03 MED ORDER — ALPRAZOLAM 0.25 MG PO TABS
0.2500 mg | ORAL_TABLET | Freq: Every evening | ORAL | Status: AC | PRN
Start: 2021-02-03 — End: 2021-02-04
  Administered 2021-02-04: 0.25 mg via ORAL
  Filled 2021-02-03: qty 1

## 2021-02-03 MED ORDER — ALPRAZOLAM 0.25 MG PO TABS
0.2500 mg | ORAL_TABLET | Freq: Every evening | ORAL | Status: AC | PRN
Start: 2021-02-03 — End: 2021-02-03
  Administered 2021-02-03: 0.25 mg via ORAL
  Filled 2021-02-03: qty 1

## 2021-02-03 NOTE — Progress Notes (Signed)
PROGRESS NOTE    Jeremy [name]  ZOX:[id number] DOB: 06/21/1945 DOA: 02/02/2021 PCP: Jeremy Hilding, MD   No chief complaint on file.   Brief admission Narrative:  As per H&P written by Dr. [name] on 02/02/2021 75 year old male with a history of metastatic squamous cell lung cancer, atrial fibrillation, COPD, chronic hypoxic respiratory failure, chronic anemia, type 2 diabetes who was sent over to the ER today from the cardiology office.  Patient had to be hypotensive in the cardiology office.  Patient recently completed external beam radiation for his squamous cell lung cancer.  Patient is no longer getting chemotherapy.  Wife is under the impression that the patient was undergoing curative radiation therapy.  Patient had a CT scan yesterday which demonstrated worsening of his right lower lobe disease process with worsening lymphangitic spread of his cancer.  Patient also recently treated with 2 rounds of antibiotics including Bactrim and then amoxicillin for recurrent UTI with Proteus and Enterococcus.  Wife provides majority history is a patient defers to her.  I did discuss the case over the phone with the wife.  Wife states over the last couple days the patient's not been feeling well.  Has been planing of more shortness of breath.  Patient denies any diarrhea or abdominal pain.  Wife is unclear if he had any fevers.   He has had worsening shortness of breath and a cough.  He has been using his oxygen more so the last couple days.  Patient transported the ER from the cardiology office.   In ER, patient noted to be afebrile with a temperature 97.7.  Heart rate is 105.  Blood pressure 89/69.  Patient started on a fluid bolus.   Left for evaluation white count was 16.4.  Hemoglobin 5.8 g/dL.  Patient started on a blood transfusion.  Assessment & Plan: 1-acute on chronic symptomatic anemia -Status post 2 units of PRBCs transfusion -Hemoglobin 7.2 -No signs of overt bleeding. -Continue to  follow hemoglobin trend. -Long discussion with patient and patient's wife over the phone regarding findings of worsening his condition and the likelihood of not achieving any curative state.  They will discuss further regarding advance care planning. -He is DNR/DNI.  2-Type 2 diabetes mellitus (HCC) -Continue sliding scale insulin and follow CBGs. -Modify carbohydrate diet has been ordered.  3-AF (paroxysmal atrial fibrillation) (HCC) -Currently sinus rhythm -Not receiving any controlling agent at this time and not on anticoagulation given ongoing low blood count.  4-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[place]) -Status post chemo and radiation -Not a candidate for further oncology therapy per records -Discussed with patient about alternative of initiating hospice -Chest x-ray images demonstrating further respirate lymphangitic spread of his ongoing lung cancer  5-Sepsis without acute organ dysfunction ([place]) -In the setting of postobstructive pneumonia -Continue current broad-spectrum antibiotic -Continue supportive care and follow clinical response -WBCs appropriately trending down. -Currently afebrile.  6-Chronic respiratory failure with hypoxia (HCC) - 2 L/min -Stable and at baseline; using 2 L nasal cannula intermittently.  7-Hypotension due to hypovolemia -Improved after transfusion and fluid resuscitation -Currently normotensive.  8-.  DNR (do not resuscitate)/DNI -Wishes respected -patient condition is guarded and overall prognosis is poor.   DVT prophylaxis: SCD's Code Status: DNR/DNI Family Communication: Wife over the phone Disposition:   Status is: Inpatient  Remains inpatient appropriate because:Inpatient level of care appropriate due to severity of illness  Dispo: The patient is from: Home  Anticipated d/c is to: to be determined.               Patient currently is not medically stable to d/c.   Difficult to place patient No        Consultants:  None   Procedures:  See below for x-ray reports.  Antimicrobials:  Vancomycin and Zosyn   Subjective: Intermittent tachypnea; no chest pain, vomiting, afebrile.  Objective: Vitals:   02/03/21 1400 02/03/21 1500 02/03/21 1600 02/03/21 1700  BP: (!) 149/83 108/90 105/83 (!) 147/65  Pulse: 85 87 85 100  Resp: (!) 25 20 (!) 22 (!) 34  Temp:      TempSrc:      SpO2: 100% 100% 95% (!) 88%  Weight:      Height:        Intake/Output Summary (Last 24 hours) at 02/03/2021 1830 Last data filed at 02/03/2021 1742 Gross per 24 hour  Intake 670.16 ml  Output 1500 ml  Net -829.84 ml   Filed Weights   02/02/21 1900  Weight: 76.5 kg    Examination:  General exam: Appears calm and comfortable, reporting to be short of breath with exertion; no fever appreciated.  Denying chest pain. Respiratory system: Mild expiratory wheezing, positive rhonchi bilaterally; no using accessory muscle.  Good saturation on room air currently.  Positive tachypnea with activity appreciated Cardiovascular system: Controlled, no rubs, no gallops, no JVD appreciated on exam. Gastrointestinal system: Abdomen is nondistended, soft and nontender. No organomegaly or masses felt. Normal bowel sounds heard. Central nervous system: Alert and oriented. No focal neurological deficits. Extremities: No cyanosis or clubbing. Skin: No petechiae. Psychiatry: Judgement and insight appear normal. Mood & affect appropriate.     Data Reviewed: I have personally reviewed following labs and imaging studies  CBC: Recent Labs  Lab 02/01/21 1028 02/02/21 1307 02/03/21 0505  WBC 16.8* 16.4* 11.8*  NEUTROABS 15.7* 15.5* 10.9*  HGB 7.3* 5.8* 7.2*  HCT 24.0* 19.6* 23.4*  MCV 99.6 103.7* 98.3  PLT 271 263 161    Basic Metabolic Panel: Recent Labs  Lab 02/01/21 1028 02/02/21 1307 02/03/21 0505  NA 139 136 132*  K 4.4 4.5 3.5  CL 101 101 101  CO2 27 27 27   GLUCOSE 135* 134* 97  BUN 20 22 15    CREATININE 0.83 0.93 0.78  CALCIUM 9.6 8.2* 7.7*  MG  --   --  1.6*    GFR: Estimated Creatinine Clearance: 83.7 mL/min (by C-G formula based on SCr of 0.78 mg/dL).  Liver Function Tests: Recent Labs  Lab 02/01/21 1028 02/02/21 1307 02/03/21 0505  AST 16 16 13*  ALT 19 19 13   ALKPHOS 71 57 54  BILITOT 0.3 0.4 0.5  PROT 7.9 7.2 6.4*  ALBUMIN 2.0* 2.2* 1.8*    CBG: Recent Labs  Lab 02/02/21 2121 02/03/21 0722 02/03/21 1107 02/03/21 1615  GLUCAP 169* 98 139* 98     Recent Results (from the past 240 hour(s))  Blood Culture (routine x 2)     Status: None (Preliminary result)   Collection Time: 02/02/21  1:07 PM   Specimen: Left Antecubital; Blood  Result Value Ref Range Status   Specimen Description LEFT ANTECUBITAL  Final   Special Requests   Final    BOTTLES DRAWN AEROBIC AND ANAEROBIC Blood Culture results may not be optimal due to an excessive volume of blood received in culture bottles   Culture   Final    NO GROWTH < 24 HOURS Performed at  [facility], [address] [place], [place] [address]    Report Status PENDING  Incomplete  Blood Culture (routine x 2)     Status: None (Preliminary result)   Collection Time: 02/02/21  1:07 PM   Specimen: BLOOD RIGHT ARM  Result Value Ref Range Status   Specimen Description BLOOD RIGHT ARM  Final   Special Requests   Final    BOTTLES DRAWN AEROBIC AND ANAEROBIC Blood Culture adequate volume   Culture   Final    NO GROWTH < 24 HOURS Performed at [facility], [address], [place], [place] [address]    Report Status PENDING  Incomplete  Resp Panel by RT-PCR (Flu A&B, Covid) Nasopharyngeal Swab     Status: None   Collection Time: 02/02/21  1:11 PM   Specimen: Nasopharyngeal Swab; Nasopharyngeal(NP) swabs in vial transport medium  Result Value Ref Range Status   SARS Coronavirus 2 by RT PCR NEGATIVE NEGATIVE Final    Comment: (NOTE) SARS-CoV-2 target nucleic acids are NOT DETECTED.  The SARS-CoV-2 RNA is  generally detectable in upper respiratory specimens during the acute phase of infection. The lowest concentration of SARS-CoV-2 viral copies this assay can detect is 138 copies/mL. A negative result does not preclude SARS-Cov-2 infection and should not be used as the sole basis for treatment or other patient management decisions. A negative result may occur with  improper specimen collection/handling, submission of specimen other than nasopharyngeal swab, presence of viral mutation(s) within the areas targeted by this assay, and inadequate number of viral copies(<138 copies/mL). A negative result must be combined with clinical observations, patient history, and epidemiological information. The expected result is Negative.  Fact Sheet for Patients:  EntrepreneurPulse.com.au  Fact Sheet for Healthcare Providers:  IncredibleEmployment.be  This test is no t yet approved or cleared by the [place] FDA and  has been authorized for detection and/or diagnosis of SARS-CoV-2 by FDA under an Emergency Use Authorization (EUA). This EUA will remain  in effect (meaning this test can be used) for the duration of the COVID-19 declaration under Section 564(b)(1) of the Act, 21 U.S.C.section 360bbb-3(b)(1), unless the authorization is terminated  or revoked sooner.       Influenza A by PCR NEGATIVE NEGATIVE Final   Influenza B by PCR NEGATIVE NEGATIVE Final    Comment: (NOTE) The Xpert Xpress SARS-CoV-2/FLU/RSV plus assay is intended as an aid in the diagnosis of influenza from Nasopharyngeal swab specimens and should not be used as a sole basis for treatment. Nasal washings and aspirates are unacceptable for Xpert Xpress SARS-CoV-2/FLU/RSV testing.  Fact Sheet for Patients: EntrepreneurPulse.com.au  Fact Sheet for Healthcare Providers: IncredibleEmployment.be  This test is not yet approved or cleared by the [place] FDA and has been authorized for detection and/or diagnosis of SARS-CoV-2 by FDA under an Emergency Use Authorization (EUA). This EUA will remain in effect (meaning this test can be used) for the duration of the COVID-19 declaration under Section 564(b)(1) of the Act, 21 U.S.C. section 360bbb-3(b)(1), unless the authorization is terminated or revoked.  Performed at [facility], [address], [place], [place] [address]   MRSA Next Gen by PCR, Nasal     Status: None   Collection Time: 02/02/21  7:08 PM   Specimen: Nasal Mucosa; Nasal Swab  Result Value Ref Range Status   MRSA by PCR Next Gen NOT DETECTED NOT DETECTED Final    Comment: (NOTE) The GeneXpert MRSA Assay (FDA approved for NASAL specimens only), is one  component of a comprehensive MRSA colonization surveillance program. It is not intended to diagnose MRSA infection nor to guide or monitor treatment for MRSA infections. Test performance is not FDA approved in patients less than 67 years old. Performed at [facility], [address], [place], [place] [address]       Radiology Studies: No results found.   Scheduled Meds:  insulin aspart  0-5 Units Subcutaneous QHS   insulin aspart  0-9 Units Subcutaneous TID WC   metoprolol tartrate  50 mg Oral BID   tamsulosin  0.4 mg Oral Daily   Continuous Infusions:  sodium chloride     piperacillin-tazobactam Stopped (02/03/21 1303)   piperacillin-tazobactam 100 mL/hr at 02/03/21 1742   vancomycin 1,500 mg (02/03/21 1728)     LOS: 1 day    Time spent: 35 minutes.    Barton Dubois, MD [facility]   To contact the attending provider between 7A-7P or the covering provider during after hours 7P-7A, please log into the web site [email or web address] and access using universal [place] password for that web site. If you do not have the password, please call the hospital operator.  02/03/2021, 6:30 PM

## 2021-02-03 NOTE — Progress Notes (Signed)
TRH night shift ICU coverage note.  Similar to last night, the patient was restless and unable to sleep.  Alprazolam 0.25 mg p.o. ordered.  Tennis Must, MD.

## 2021-02-03 NOTE — Progress Notes (Signed)
TRH night shift ICU coverage note.  The patient and asked the nursing staff for something to help him sleep.  He was admitted earlier to the ICU with with hypotension in the setting of symptomatic anemia, sepsis and failure to thrive due to metastatic squamous cell lung carcinoma.  He is normotensive now.  Alprazolam 0.25 mg p.o. x1 dose ordered.  Tennis Must, MD.

## 2021-02-04 ENCOUNTER — Ambulatory Visit: Payer: Medicare Other | Admitting: Physician Assistant

## 2021-02-04 LAB — GLUCOSE, CAPILLARY
Glucose-Capillary: 85 mg/dL (ref 70–99)
Glucose-Capillary: 107 mg/dL — ABNORMAL HIGH (ref 70–99)
Glucose-Capillary: 122 mg/dL — ABNORMAL HIGH (ref 70–99)
Glucose-Capillary: 131 mg/dL — ABNORMAL HIGH (ref 70–99)

## 2021-02-04 LAB — BASIC METABOLIC PANEL
Anion gap: 11 (ref 5–15)
BUN: 12 mg/dL (ref 8–23)
CO2: 28 mmol/L (ref 22–32)
Calcium: 8.2 mg/dL — ABNORMAL LOW (ref 8.9–10.3)
Chloride: 98 mmol/L (ref 98–111)
Creatinine, Ser: 0.74 mg/dL (ref 0.61–1.24)
GFR, Estimated: >60 mL/min (ref >60)
Glucose, Bld: 101 mg/dL — ABNORMAL HIGH (ref 70–99)
Potassium: 3.4 mmol/L — ABNORMAL LOW (ref 3.5–5.1)
Sodium: 137 mmol/L (ref 135–145)

## 2021-02-04 LAB — LEGIONELLA PNEUMOPHILA SEROGP 1 UR AG: L. pneumophila Serogp 1 Ur Ag: NEGATIVE

## 2021-02-04 LAB — CBC
HCT: 26 % — ABNORMAL LOW (ref 39.0–52.0)
Hemoglobin: 8.1 g/dL — ABNORMAL LOW (ref 13.0–17.0)
MCH: 30.6 pg (ref 26.0–34.0)
MCHC: 31.2 g/dL (ref 30.0–36.0)
MCV: 98.1 fL (ref 80.0–100.0)
Platelets: 221 10*3/uL (ref 150–400)
RBC: 2.65 MIL/uL — ABNORMAL LOW (ref 4.22–5.81)
RDW: 16.2 % — ABNORMAL HIGH (ref 11.5–15.5)
WBC: 11.2 10*3/uL — ABNORMAL HIGH (ref 4.0–10.5)
nRBC: 0 % (ref 0.0–0.2)

## 2021-02-04 LAB — PROCALCITONIN: Procalcitonin: 0.18 ng/mL

## 2021-02-04 MED ORDER — GUAIFENESIN-DM 100-10 MG/5ML PO SYRP
5.0000 mL | ORAL_SOLUTION | ORAL | Status: DC | PRN
  Administered 2021-02-04 – 2021-02-05 (×3): 5 mL via ORAL
  Filled 2021-02-04 (×3): qty 5

## 2021-02-04 MED ORDER — AMOXICILLIN-POT CLAVULANATE 875-125 MG PO TABS
1.0000 | ORAL_TABLET | Freq: Two times a day (BID) | ORAL | Status: DC
  Administered 2021-02-04 – 2021-02-05 (×2): 1 via ORAL
  Filled 2021-02-04 (×2): qty 1

## 2021-02-04 NOTE — TOC Initial Note (Signed)
Transition of Care [facility]) - Initial/Assessment Note    Patient Details  Name: Jeremy [name] MRN: [id number] Date of Birth: Nov 04, 1945  [facility] CM/SW Contact:    Natasha Bence, LCSW Phone Number: 02/04/2021, 4:33 PM  Clinical Narrative:                 Patient is a 75 year old male admitted for Symptomatic anemia. CSW observed patient's high readmission risk score. CSW completed high readmission risk assessment. Patient's wife requested home with hospice and a hospital bed. Csw placed referral with [facility] hospice. TOC to follow.  Expected Discharge Plan: Home w Hospice Care Barriers to Discharge: Barriers Resolved   Patient Goals and CMS Choice Patient states their goals for this hospitalization and ongoing recovery are:: Return home with hospice CMS Medicare.gov Compare Post Acute Care list provided to:: Patient    Expected Discharge Plan and Services Expected Discharge Plan: [place]                                              Prior Living Arrangements/Services   Lives with:: Self, Spouse Patient language and need for interpreter reviewed:: Yes Do you feel safe going back to the place where you live?: Yes      Need for Family Participation in Patient Care: Yes (Comment) Care giver support system in place?: Yes (comment)   Criminal Activity/Legal Involvement Pertinent to Current Situation/Hospitalization: No - Comment as needed  Activities of Daily Living Home Assistive Devices/Equipment: Walker (specify type), Cane (specify quad or straight), CBG Meter, Eyeglasses, Blood pressure cuff, Oxygen ADL Screening (condition at time of admission) Patient's cognitive ability adequate to safely complete daily activities?: Yes Is the patient deaf or have difficulty hearing?: No Does the patient have difficulty seeing, even when wearing glasses/contacts?: No Does the patient have difficulty concentrating, remembering, or making  decisions?: No Patient able to express need for assistance with ADLs?: Yes Does the patient have difficulty dressing or bathing?: Yes Independently performs ADLs?: No Communication: Independent Dressing (OT): Needs assistance Is this a change from baseline?: Pre-admission baseline Grooming: Needs assistance Is this a change from baseline?: Pre-admission baseline Feeding: Independent Bathing: Needs assistance Is this a change from baseline?: Pre-admission baseline Toileting: Needs assistance Is this a change from baseline?: Pre-admission baseline In/Out Bed: Needs assistance Is this a change from baseline?: Pre-admission baseline Walks in Home: Needs assistance Is this a change from baseline?: Pre-admission baseline Does the patient have difficulty walking or climbing stairs?: Yes Weakness of Legs: Both Weakness of Arms/Hands: Both  Permission Sought/Granted Permission sought to share information with : Family Supports Permission granted to share information with : Yes, Verbal Permission Granted  Share Information with NAME: [name], [name]  Permission granted to share info w AGENCY: [facility]  Permission granted to share info w Relationship: (Spouse)  Permission granted to share info w Contact Information: [phone]  Emotional Assessment       Orientation: : Oriented to Self, Oriented to Place Alcohol / Substance Use: Not Applicable Psych Involvement: No (comment)  Admission diagnosis:  Symptomatic anemia [D64.9] Patient Active Problem List   Diagnosis Date Noted   Symptomatic anemia 02/02/2021   Hypotension due to hypovolemia 02/02/2021   DNR (do not resuscitate)/DNI 02/02/2021   Sepsis without acute organ dysfunction ([place]) 11/30/2020   Chronic respiratory failure  with hypoxia ([place]) - 2 L/min 11/30/2020   Herniated nucleus pulposus, L3-4 08/30/2020   Recurrent right pleural effusion    Advanced care planning/counseling discussion    Palliative care by specialist     Palliative care encounter    Loculated pleural effusion 11/08/2019   Acquired thrombophilia ([place]) 11/08/2019   Port-A-Cath in place 11/05/2019   Pleural effusion 11/05/2019   Sepsis due to undetermined organism ([place]) 10/19/2019   Thrombocytopenia ([place]) 10/19/2019   Failure to thrive in adult 10/16/2019   Generalized weakness 10/16/2019   Drug-induced skin rash 09/23/2019   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[place]) 08/26/2019   Encounter for antineoplastic chemotherapy 08/26/2019   Encounter for antineoplastic immunotherapy 08/26/2019   Goals of care, counseling/discussion 08/26/2019   AF (paroxysmal atrial fibrillation) ([place])    Protein-calorie malnutrition, severe 08/07/2019   Hemoptysis 08/06/2019   Mass of right lung 08/06/2019   Former smoker 08/06/2019   Lung mass 08/06/2019   Postobstructive pneumonia 08/06/2019   COPD (chronic obstructive pulmonary disease) ([place])    HTN (hypertension)    Abnormal weight loss    Leukocytosis    Acute and chronic respiratory failure with hypoxia (HCC)    Anemia in neoplastic disease    Hyperglycemia    Heme + stool 11/06/2018   Type 2 diabetes mellitus ([place]) 2018   PCP:  [name], MD Pharmacy:   [place], [place] [address] [place] [address] Phone: [phone] Fax: [phone]     Social Determinants of Health (SDOH) Interventions    Readmission Risk Interventions Readmission Risk Prevention Plan 02/04/2021 11/10/2019 10/20/2019  Transportation Screening Complete Complete Complete  PCP or Specialist Appt within 3-5 Days Complete - -  HRI or Home Care Consult Complete - Complete  Social Work Consult for Recovery Care Planning/Counseling Complete - Complete  Palliative Care Screening Not Complete - Not Applicable  Palliative Care Screening Not Complete Comments Palliative consult not entered - -  Medication Review Press photographer) Complete Complete Complete  HRI or Home  Care Consult - Complete -  SW Recovery Care/Counseling Consult - Complete -  Palliative Care Screening - Not Applicable -  [place] - Not Applicable -  Some recent data might be hidden

## 2021-02-04 NOTE — Progress Notes (Signed)
PROGRESS NOTE    Jeremy [name]  WFU:[id number] DOB: 1946/02/09 DOA: 02/02/2021 PCP: [name], MD   No chief complaint on file.   Brief admission Narrative:  As per H&P written by Dr. [name] on 02/02/2021 75 year old male with a history of metastatic squamous cell lung cancer, atrial fibrillation, COPD, chronic hypoxic respiratory failure, chronic anemia, type 2 diabetes who was sent over to the ER today from the cardiology office.  Patient had to be hypotensive in the cardiology office.  Patient recently completed external beam radiation for his squamous cell lung cancer.  Patient is no longer getting chemotherapy.  Wife is under the impression that the patient was undergoing curative radiation therapy.  Patient had a CT scan yesterday which demonstrated worsening of his right lower lobe disease process with worsening lymphangitic spread of his cancer.  Patient also recently treated with 2 rounds of antibiotics including Bactrim and then amoxicillin for recurrent UTI with Proteus and Enterococcus.  Wife provides majority history is a patient defers to her.  I did discuss the case over the phone with the wife.  Wife states over the last couple days the patient's not been feeling well.  Has been planing of more shortness of breath.  Patient denies any diarrhea or abdominal pain.  Wife is unclear if he had any fevers.   He has had worsening shortness of breath and a cough.  He has been using his oxygen more so the last couple days.  Patient transported the ER from the cardiology office.   In ER, patient noted to be afebrile with a temperature 97.7.  Heart rate is 105.  Blood pressure 89/69.  Patient started on a fluid bolus.   Left for evaluation white count was 16.4.  Hemoglobin 5.8 g/dL.  Patient started on a blood transfusion.  Assessment & Plan: 1-acute on chronic symptomatic anemia -Status post 2 units of PRBCs transfusion -Hemoglobin up to 8.1 -No signs of overt  bleeding. -Continue to follow hemoglobin trend. -Long discussion with patient and patient's wife over the phone regarding findings of worsening his cancer condition/limphangitic spread and the likelihood of not achieving any curative state.   -He is DNR/DNI. -will like home hospice initiation.  2-Type 2 diabetes mellitus (HCC) -Continue sliding scale insulin and follow CBGs. -Modify carbohydrate diet has been ordered.  3-AF (paroxysmal atrial fibrillation) (HCC) -Currently sinus rhythm -Not receiving any controlling agent at this time and not on anticoagulation given ongoing low blood count.  4-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[place]) -Status post chemo and radiation -Not a candidate for further oncology therapy per records -Discussed with patient about alternative of initiating hospice -Chest x-ray images demonstrating further lymphangitic spread of his ongoing lung cancer. -Planning to proceed with home hospice  5-Sepsis without acute organ dysfunction ([place]) -In the setting of postobstructive pneumonia -MRSA PCR negative; vancomycin discontinued. -Sepsis features resolved; patient antibiotic has been transitioned to Augmentin to complete therapy. -Continue supportive care and follow clinical response -WBCs appropriately trending down and Currently afebrile.  6-Chronic respiratory failure with hypoxia (HCC) - 2 L/min -Stable and at baseline; using 2 L nasal cannula intermittently. -Continue oxygen supplementation.  7-Hypotension due to hypovolemia -Improved after transfusion and fluid resuscitation -Currently normotensive. -Continue to maintain adequate hydration.  8-.  DNR (do not resuscitate)/DNI -Wishes respected -patient condition is guarded and overall prognosis is poor. -After long discussion with patient and wife decision has been made to discharge home with hospice follow-up.  9-BPH -Continue the  use of Flomax -No complaints of urinary retention  currently.   DVT prophylaxis: SCD's Code Status: DNR/DNI Family Communication: Wife over the phone Disposition:   Status is: Inpatient  Remains inpatient appropriate because:Inpatient level of care appropriate due to severity of illness  Dispo: The patient is from: Home              Anticipated d/c is to: Home with hospice              Patient currently is not medically stable to d/c.   Difficult to place patient No       Consultants:  None   Procedures:  See below for x-ray reports.  Antimicrobials:  Vancomycin and Zosyn 02/02/21 Augmentin 02/04/21   Subjective: Feeling better, no chest pain, no nausea, no vomiting.  Reported improvement in his breathing and respiratory rate.  Objective: Vitals:   02/04/21 1100 02/04/21 1400 02/04/21 1450 02/04/21 1500  BP:  133/85  127/74  Pulse: 79 100 93 94  Resp: (!) 21 (!) 28 (!) 22 20  Temp: 97.8 F (36.6 C)  98.6 F (37 C)   TempSrc: Oral  Axillary   SpO2: 100% 99% 100% 100%  Weight:      Height:        Intake/Output Summary (Last 24 hours) at 02/04/2021 1545 Last data filed at 02/04/2021 1525 Gross per 24 hour  Intake 183.12 ml  Output 1000 ml  Net -816.88 ml   Filed Weights   02/02/21 1900  Weight: 76.5 kg    Examination: General exam: Alert, awake, oriented x 3, afebrile, no nausea, no chest pain, no vomiting.  Expressed feeling stable. Respiratory system: Positive rhonchi bilaterally; no crackles.  No using accessory muscle.  2 L nasal cannula supplementation in place. Cardiovascular system: Rate controlled, no rubs, no gallops, no JVD. Gastrointestinal system: Abdomen is nondistended, soft and nontender. No organomegaly or masses felt. Normal bowel sounds heard. Central nervous system: Alert and oriented. No focal neurological deficits. Extremities: No cyanosis or clubbing. Skin: No petechiae. Psychiatry: Judgement and insight appear normal. Mood & affect appropriate.   Data Reviewed: I have personally  reviewed following labs and imaging studies  CBC: Recent Labs  Lab 02/01/21 1028 02/02/21 1307 02/03/21 0505 02/04/21 0458  WBC 16.8* 16.4* 11.8* 11.2*  NEUTROABS 15.7* 15.5* 10.9*  --   HGB 7.3* 5.8* 7.2* 8.1*  HCT 24.0* 19.6* 23.4* 26.0*  MCV 99.6 103.7* 98.3 98.1  PLT 271 263 198 829    Basic Metabolic Panel: Recent Labs  Lab 02/01/21 1028 02/02/21 1307 02/03/21 0505 02/04/21 0458  NA 139 136 132* 137  K 4.4 4.5 3.5 3.4*  CL 101 101 101 98  CO2 27 27 27 28   GLUCOSE 135* 134* 97 101*  BUN 20 22 15 12   CREATININE 0.83 0.93 0.78 0.74  CALCIUM 9.6 8.2* 7.7* 8.2*  MG  --   --  1.6*  --     GFR: Estimated Creatinine Clearance: 83.7 mL/min (by C-G formula based on SCr of 0.74 mg/dL).  Liver Function Tests: Recent Labs  Lab 02/01/21 1028 02/02/21 1307 02/03/21 0505  AST 16 16 13*  ALT 19 19 13   ALKPHOS 71 57 54  BILITOT 0.3 0.4 0.5  PROT 7.9 7.2 6.4*  ALBUMIN 2.0* 2.2* 1.8*    CBG: Recent Labs  Lab 02/03/21 1107 02/03/21 1615 02/03/21 2137 02/04/21 0740 02/04/21 1131  GLUCAP 139* 98 106* 85 107*     Recent Results (from the past  240 hour(s))  Blood Culture (routine x 2)     Status: None (Preliminary result)   Collection Time: 02/02/21  1:07 PM   Specimen: Left Antecubital; Blood  Result Value Ref Range Status   Specimen Description LEFT ANTECUBITAL  Final   Special Requests   Final    BOTTLES DRAWN AEROBIC AND ANAEROBIC Blood Culture results may not be optimal due to an excessive volume of blood received in culture bottles   Culture   Final    NO GROWTH 2 DAYS Performed at [facility], [address], [place], [place] [address]    Report Status PENDING  Incomplete  Blood Culture (routine x 2)     Status: None (Preliminary result)   Collection Time: 02/02/21  1:07 PM   Specimen: BLOOD RIGHT ARM  Result Value Ref Range Status   Specimen Description BLOOD RIGHT ARM  Final   Special Requests   Final    BOTTLES DRAWN AEROBIC AND ANAEROBIC Blood  Culture adequate volume   Culture   Final    NO GROWTH 2 DAYS Performed at [facility], [address] [place],  [address]    Report Status PENDING  Incomplete  Resp Panel by RT-PCR (Flu A&B, Covid) Nasopharyngeal Swab     Status: None   Collection Time: 02/02/21  1:11 PM   Specimen: Nasopharyngeal Swab; Nasopharyngeal(NP) swabs in vial transport medium  Result Value Ref Range Status   SARS Coronavirus 2 by RT PCR NEGATIVE NEGATIVE Final    Comment: (NOTE) SARS-CoV-2 target nucleic acids are NOT DETECTED.  The SARS-CoV-2 RNA is generally detectable in upper respiratory specimens during the acute phase of infection. The lowest concentration of SARS-CoV-2 viral copies this assay can detect is 138 copies/mL. A negative result does not preclude SARS-Cov-2 infection and should not be used as the sole basis for treatment or other patient management decisions. A negative result may occur with  improper specimen collection/handling, submission of specimen other than nasopharyngeal swab, presence of viral mutation(s) within the areas targeted by this assay, and inadequate number of viral copies(<138 copies/mL). A negative result must be combined with clinical observations, patient history, and epidemiological information. The expected result is Negative.  Fact Sheet for Patients:  EntrepreneurPulse.com.au  Fact Sheet for Healthcare Providers:  IncredibleEmployment.be  This test is no t yet approved or cleared by the [place] FDA and  has been authorized for detection and/or diagnosis of SARS-CoV-2 by FDA under an Emergency Use Authorization (EUA). This EUA will remain  in effect (meaning this test can be used) for the duration of the COVID-19 declaration under Section 564(b)(1) of the Act, 21 U.S.C.section 360bbb-3(b)(1), unless the authorization is terminated  or revoked sooner.       Influenza A by PCR NEGATIVE NEGATIVE Final    Influenza B by PCR NEGATIVE NEGATIVE Final    Comment: (NOTE) The Xpert Xpress SARS-CoV-2/FLU/RSV plus assay is intended as an aid in the diagnosis of influenza from Nasopharyngeal swab specimens and should not be used as a sole basis for treatment. Nasal washings and aspirates are unacceptable for Xpert Xpress SARS-CoV-2/FLU/RSV testing.  Fact Sheet for Patients: EntrepreneurPulse.com.au  Fact Sheet for Healthcare Providers: IncredibleEmployment.be  This test is not yet approved or cleared by the [place] FDA and has been authorized for detection and/or diagnosis of SARS-CoV-2 by FDA under an Emergency Use Authorization (EUA). This EUA will remain in effect (meaning this test can be used) for the duration of the COVID-19 declaration under Section  564(b)(1) of the Act, 21 U.S.C. section 360bbb-3(b)(1), unless the authorization is terminated or revoked.  Performed at [facility], [address] [place], [place] [address]   Urine Culture     Status: Abnormal (Preliminary result)   Collection Time: 02/02/21  4:40 PM   Specimen: In/Out Cath Urine  Result Value Ref Range Status   Specimen Description   Final    IN/OUT CATH URINE Performed at [facility], [address], [place], [place] [address]    Special Requests   Final    NONE Performed at [facility], [address] [place], [place] [address]    Culture (A)  Final    >=100,000 COLONIES/mL STAPHYLOCOCCUS EPIDERMIDIS SUSCEPTIBILITIES TO FOLLOW Performed at [facility], [place] [address] [place], [place] [address]    Report Status PENDING  Incomplete  MRSA Next Gen by PCR, Nasal     Status: None   Collection Time: 02/02/21  7:08 PM   Specimen: Nasal Mucosa; Nasal Swab  Result Value Ref Range Status   MRSA by PCR Next Gen NOT DETECTED NOT DETECTED Final    Comment: (NOTE) The GeneXpert MRSA Assay (FDA approved for NASAL specimens only), is one component of a comprehensive  MRSA colonization surveillance program. It is not intended to diagnose MRSA infection nor to guide or monitor treatment for MRSA infections. Test performance is not FDA approved in patients less than 76 years old. Performed at [facility], [address] [place], [place] [address]       Radiology Studies: No results found.   Scheduled Meds:  amoxicillin-clavulanate  1 tablet Oral Q12H   insulin aspart  0-5 Units Subcutaneous QHS   insulin aspart  0-9 Units Subcutaneous TID WC   metoprolol tartrate  50 mg Oral BID   tamsulosin  0.4 mg Oral Daily   Continuous Infusions:  sodium chloride       LOS: 2 days    Time spent: 30 minutes.    Barton Dubois, MD [facility]   To contact the attending provider between 7A-7P or the covering provider during after hours 7P-7A, please log into the web site [email or web address] and access using universal [place] password for that web site. If you do not have the password, please call the hospital operator.  02/04/2021, 3:45 PM

## 2021-02-04 NOTE — Care Management Important Message (Signed)
Important Message  Patient Details  Name: Jeremy [name] MRN: [id number] Date of Birth: 11/19/1945   Medicare Important Message Given:  Yes     Tommy Medal 02/04/2021, 4:53 PM

## 2021-02-05 LAB — URINE CULTURE: Culture: >=100000 — AB

## 2021-02-05 LAB — GLUCOSE, CAPILLARY
Glucose-Capillary: 105 mg/dL — ABNORMAL HIGH (ref 70–99)
Glucose-Capillary: 142 mg/dL — ABNORMAL HIGH (ref 70–99)

## 2021-02-05 MED ORDER — GUAIFENESIN-DM 100-10 MG/5ML PO SYRP: 5.0000 mL | ORAL_SOLUTION | ORAL | 0 refills | Status: AC | PRN

## 2021-02-05 MED ORDER — NITROFURANTOIN MONOHYD MACRO 100 MG PO CAPS
100.0000 mg | ORAL_CAPSULE | Freq: Two times a day (BID) | ORAL | Status: DC
  Administered 2021-02-05: 100 mg via ORAL
  Filled 2021-02-05 (×5): qty 1

## 2021-02-05 MED ORDER — AMOXICILLIN-POT CLAVULANATE 875-125 MG PO TABS: 1.0000 | ORAL_TABLET | Freq: Two times a day (BID) | ORAL | 0 refills | Status: AC

## 2021-02-05 MED ORDER — NITROFURANTOIN MONOHYD MACRO 100 MG PO CAPS: 100.0000 mg | ORAL_CAPSULE | Freq: Two times a day (BID) | ORAL | 0 refills | Status: AC

## 2021-02-05 NOTE — Progress Notes (Signed)
Patient was discharged to home. IV access was removed and male wick also removed. Gave information discharge packet and wife helped him get dressed. Took down to car by wheelchair.

## 2021-02-05 NOTE — Discharge Summary (Signed)
Physician Discharge Summary  Jeremy [name] FUX:[id number] DOB: 1945-07-25 DOA: 02/02/2021  PCP: Jeremy Hilding, MD  Admit date: 02/02/2021 Discharge date: 02/05/2021  Time spent: 35 minutes  Recommendations for Outpatient Follow-up:  Reassess home meds while transitioning into hospice.   Discharge Diagnoses:  Principal Problem:   Symptomatic anemia Active Problems:   Type 2 diabetes mellitus (HCC)   AF (paroxysmal atrial fibrillation) (HCC)   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HCC)   Sepsis without acute organ dysfunction (HCC)   Chronic respiratory failure with hypoxia (HCC) - 2 L/min   Hypotension due to hypovolemia   DNR (do not resuscitate)/DNI   Discharge Condition: Stable and improved.  Discharged home with home hospice.  Patient will follow-up with his PCP in 1 week.  CODE STATUS: DNR/DNI  Diet recommendation: regular diet.  Filed Weights   02/02/21 1900 02/05/21 0500  Weight: 76.5 kg 76 kg    History of present illness:  As per H&P written by Dr. Bridgett [name] on 02/02/2021 75 year old male with a history of metastatic squamous cell lung cancer, atrial fibrillation, COPD, chronic hypoxic respiratory failure, chronic anemia, type 2 diabetes who was sent over to the ER today from the cardiology office.  Patient had to be hypotensive in the cardiology office.  Patient recently completed external beam radiation for his squamous cell lung cancer.  Patient is no longer getting chemotherapy.  Wife is under the impression that the patient was undergoing curative radiation therapy.  Patient had a CT scan yesterday which demonstrated worsening of his right lower lobe disease process with worsening lymphangitic spread of his cancer.  Patient also recently treated with 2 rounds of antibiotics including Bactrim and then amoxicillin for recurrent UTI with Proteus and Enterococcus.  Wife provides majority history is a patient defers to her.  I did discuss the case over the phone  with the wife.  Wife states over the last couple days the patient's not been feeling well.  Has been planing of more shortness of breath.  Patient denies any diarrhea or abdominal pain.  Wife is unclear if he had any fevers.   He has had worsening shortness of breath and a cough.  He has been using his oxygen more so the last couple days.  Patient transported the ER from the cardiology office.   In ER, patient noted to be afebrile with a temperature 97.7.  Heart rate is 105.  Blood pressure 89/69.  Patient started on a fluid bolus.   Left for evaluation white count was 16.4.  Hemoglobin 5.8 g/dL.  Patient started on a blood transfusion.  Hospital Course:  1-acute on chronic symptomatic anemia -Status post 2 units of PRBCs transfusion -Hemoglobin up to 8.1 -No signs of overt bleeding. -Continue to follow hemoglobin trend. -Long discussion with patient and patient's wife over the phone regarding findings of worsening his cancer condition/limphangitic spread and the likelihood of not achieving any curative state.   -He is DNR/DNI. -will like home hospice initiation.   2-Type 2 diabetes mellitus (HCC) -Continue sliding scale insulin and follow CBGs. -Modify carbohydrate diet has been ordered.   3-AF (paroxysmal atrial fibrillation) (HCC) -Currently sinus rhythm -Not receiving any controlling agent at this time and not on anticoagulation given ongoing low blood count.   4-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[place]) -Status post chemo and radiation -Not a candidate for further oncology therapy per records -Discussed with patient about alternative of initiating hospice -Chest x-ray images demonstrating further  lymphangitic spread of his ongoing lung cancer. -Planning to proceed with home hospice   5-Sepsis without acute organ dysfunction ([place]): due to postobstructive PNA and UTI. -In the setting of postobstructive pneumonia -MRSA PCR negative; vancomycin discontinued. -Sepsis  features resolved; patient antibiotic has been transitioned to Augmentin and macrobid to complete therapy. -Continue supportive care and follow clinical response -WBCs appropriately trending down and Currently afebrile.   6-Chronic respiratory failure with hypoxia (HCC) - 2 L/min -Stable and at baseline -continue using 2 L nasal cannula intermittently. -Continue oxygen supplementation.   7-Hypotension due to hypovolemia -Improved after transfusion and fluid resuscitation -Currently normotensive. -Continue to maintain adequate hydration.   8-DNR (do not resuscitate)/DNI -Wishes respected -patient condition is guarded and overall prognosis is poor. -After long discussion with patient and wife decision has been made to discharge home with hospice follow-up.   9-BPH -Continue the use of Flomax -No complaints of urinary retention currently.  10-staph epidermidis UTI -Culture demonstrating more than 100,000 colonies of a staph epidermidis -Sensitive to Macrobid; will treat with 5 days to minimize the chances of developing symptoms and becoming uncomfortable.  Procedures: See below for x-ray report.  Consultations: Palliative care Hospice  Discharge Exam: Vitals:   02/05/21 1200 02/05/21 1300  BP:    Pulse: 87 81  Resp: (!) 22 (!) 27  Temp:    SpO2: 91% 93%   General exam: Alert, awake, oriented x 3, afebrile, no nausea, no chest pain, no vomiting.  Expressed feeling stable. Respiratory system: Positive rhonchi bilaterally; no crackles.  No using accessory muscle.  2 L nasal cannula supplementation in place. Cardiovascular system: Rate controlled, no rubs, no gallops, no JVD. Gastrointestinal system: Abdomen is nondistended, soft and nontender. No organomegaly or masses felt. Normal bowel sounds heard. Central nervous system: Alert and oriented. No focal neurological deficits. Extremities: No cyanosis or clubbing. Skin: No petechiae.  Discharge Instructions   Discharge  Instructions     Discharge instructions   Complete by: As directed    Maintain adequate hydration Take medication as prescribed Follow instructions by hospice nurse and arrange follow-up with PCP in 1 week. Upright position while eating and after eating for at least 40 minutes to minimize the chance of aspiration.   Increase activity slowly   Complete by: As directed       Allergies as of 02/05/2021       Reactions   Naproxen Sodium Hives, Swelling, Rash, Other (See Comments)   "Made the face, lips, and mouth swell- had to go to the E.R."   Pembrolizumab Rash, Other (See Comments)   KEYTRUDA   Chlorhexidine    No central line   Nsaids Hives, Swelling, Rash        Medication List     TAKE these medications    acetaminophen 325 MG tablet Commonly known as: TYLENOL Take 2 tablets (650 mg total) by mouth every 6 (six) hours as needed for mild pain, fever or headache.   albuterol 108 (90 Base) MCG/ACT inhaler Commonly known as: VENTOLIN HFA Inhale 2 puffs into the lungs every 6 (six) hours as needed for wheezing or shortness of breath.   amoxicillin-clavulanate 875-125 MG tablet Commonly known as: AUGMENTIN Take 1 tablet by mouth every 12 (twelve) hours for 5 days.   bisacodyl 5 MG EC tablet Commonly known as: DULCOLAX Take 10 mg by mouth daily as needed for moderate constipation.   CENTRUM ADULTS PO Take 1 tablet by mouth daily at 6 (six) AM.  cyclobenzaprine 10 MG tablet Commonly known as: FLEXERIL Take 10 mg by mouth at bedtime as needed for muscle spasms.   docusate sodium 100 MG capsule Commonly known as: COLACE Take 100 mg by mouth at bedtime.   ferrous sulfate 325 (65 FE) MG tablet Take 325 mg by mouth daily with supper.   Fish Oil 1000 MG Caps Take 1,000 mg by mouth daily.   folic acid 1 MG tablet Commonly known as: FOLVITE Take 1 mg by mouth every evening.   gabapentin 300 MG capsule Commonly known as: NEURONTIN Take 600 mg by mouth at bedtime  as needed (for neuropathy).   guaiFENesin-dextromethorphan 100-10 MG/5ML syrup Commonly known as: ROBITUSSIN DM Take 5 mLs by mouth every 4 (four) hours as needed for cough.   meclizine 25 MG tablet Commonly known as: ANTIVERT Take 25 mg by mouth 3 (three) times daily as needed for dizziness.   metFORMIN 500 MG 24 hr tablet Commonly known as: GLUCOPHAGE-XR Take 500-1,000 mg by mouth See admin instructions. Take 1,000 mg by mouth in the morning and 500 mg at bedtime   methotrexate 2.5 MG tablet Commonly known as: RHEUMATREX Take 10 mg by mouth every Sunday. Caution:Chemotherapy. Protect from light.   metoprolol tartrate 50 MG tablet Commonly known as: LOPRESSOR Take 1 tablet (50 mg total) by mouth 2 (two) times daily.   nitrofurantoin (macrocrystal-monohydrate) 100 MG capsule Commonly known as: MACROBID Take 1 capsule (100 mg total) by mouth every 12 (twelve) hours for 5 days.   oxyCODONE-acetaminophen 10-325 MG tablet Commonly known as: PERCOCET Take 1 tablet by mouth every 6 (six) hours as needed for pain.   tamsulosin 0.4 MG Caps capsule Commonly known as: FLOMAX Take 0.4 mg by mouth daily.   traMADol 50 MG tablet Commonly known as: ULTRAM Take 50 mg by mouth every 4 (four) hours as needed for moderate pain.       Allergies  Allergen Reactions   Naproxen Sodium Hives, Swelling, Rash and Other (See Comments)    "Made the face, lips, and mouth swell- had to go to the E.R."   Pembrolizumab Rash and Other (See Comments)    KEYTRUDA   Chlorhexidine     No central line   Nsaids Hives, Swelling and Rash    Follow-up Information     [name], [name], MD. Schedule an appointment as soon as possible for a visit in 1 week(s).   Specialty: Family Medicine Contact information: [place] [address] [phone]         Arnoldo Lenis, MD .   Specialty: Cardiology Contact information: [address] [place] [address] [phone]                  The results of significant diagnostics from this hospitalization (including imaging, microbiology, ancillary and laboratory) are listed below for reference.    Significant Diagnostic Studies: CT Chest W Contrast  Result Date: 02/02/2021 CLINICAL DATA:  Primary Cancer Type: Lung Imaging Indication: Assess response to therapy Interval therapy since last imaging? Yes Initial Cancer Diagnosis Date: 08/08/2019; Established by: Biopsy-proven Detailed Pathology: Stage IIIC/IV non-small cell lung cancer, squamous cell carcinoma. Primary Tumor location: Initial right lower lobe. New/enlarging left lower lobe lesion. Surgeries: Microdiscectomy right L3-4 08/30/2020. Chemotherapy: Yes; Ongoing? No; Most recent administration: 01/21/2020 Immunotherapy?  Yes; Type: Keytruda; Ongoing? No Radiation therapy? Yes Date Range: 11/23/2020 - 01/27/2021; Target: Left lung Date Range: 08/11/2019 - 08/28/2019; Target: Right lung EXAM: CT CHEST WITH CONTRAST  TECHNIQUE: Multidetector CT imaging of the chest was performed during intravenous contrast administration. CONTRAST:  72mL OMNIPAQUE IOHEXOL 350 MG/ML SOLN COMPARISON:  CT chest PE study, 12/01/2020, 10/15/2020 PET-CT. CT chest, abdomen and pelvis 10/04/2020. FINDINGS: CT CHEST FINDINGS Cardiovascular: Aortic atherosclerosis. Normal heart size. Left coronary artery calcifications. No pericardial effusion. Mediastinum/Nodes: No enlarged mediastinal, hilar, or axillary lymph nodes. Thyroid gland, trachea, and esophagus demonstrate no significant findings. Lungs/Pleura: Unchanged moderate, loculated right pleural effusion with extensive associated pleural thickening. Redemonstrated post treatment fibrosis and consolidation of the perihilar right lung and right lower lobe. There has been a very substantial increase in dense consolidation and volume loss of the right lung, particular in the posterior right upper lobe (series 5, image 58). There is a corresponding increase  in degree of obstruction of the right lobar bronchi. Multiple new nodules in the aerated portion of the lung, for example a 1.7 x 1.0 cm nodule of the peripheral right upper lobe (series 5, image 83). There is interlobular septal thickening throughout the aerated portions the right lung. Redemonstrated cavitary lesion of the superior segment left lower lobe, which is significantly diminished in size and with a greater volume of internal air, measuring approximately 4.3 x 3.8 cm, previously 7.0 x 5.1 cm when measured similarly (series 5, image 85). There is bronchial wall thickening and plugging in this vicinity. There are new, clustered nodules in the dependent left lower lobe inferior to this lesion (series 5, image 114) and a new subpleural nodule of the superior segment left lower lobe measuring 1.2 x 0.9 cm (series 5, image 49). An irregular nodule of the peripheral left upper lobe with associated pleural tenting is unchanged, measuring 0.9 x 0.7 cm (series 5, image 55). Musculoskeletal: No chest wall mass or suspicious bone lesions identified. CT ABDOMEN PELVIS FINDINGS Hepatobiliary: No solid liver abnormality is seen. No gallstones, gallbladder wall thickening, or biliary dilatation. Pancreas: Unremarkable. No pancreatic ductal dilatation or surrounding inflammatory changes. Spleen: Normal in size without significant abnormality. Adrenals/Urinary Tract: Adrenal glands are unremarkable. Kidneys are normal, without renal calculi, solid lesion, or hydronephrosis. Bladder is unremarkable. Stomach/Bowel: Stomach is within normal limits. Small incidental diverticulum of the transverse portion of the duodenum. Appendix appears normal. No evidence of bowel wall thickening, distention, or inflammatory changes. Descending and sigmoid diverticulosis. Large burden of stool throughout the colon rectum. Vascular/Lymphatic: Aortic atherosclerosis. No enlarged abdominal or pelvic lymph nodes. Reproductive: No mass or other  abnormality. Other: No abdominal wall hernia or abnormality. No abdominopelvic ascites. Musculoskeletal: No acute or significant osseous findings. IMPRESSION: 1. Redemonstrated post treatment fibrosis and consolidation of the perihilar right lung and right lower lobe. There has been a very substantial increase in dense consolidation and volume loss of the right lung, particularly in the posterior right upper lobe. There is a corresponding increase in degree of obstruction of the right lobar bronchi. Interlobular septal thickening throughout the aerated portions of the right lung. Findings are consistent with locally worsened perihilar right lung malignancy with significant postobstructive airspace disease as well as pleural and lymphangitic involvement. 2. Multiple new bilateral pulmonary nodules, most consistent with pulmonary parenchymal metastatic disease. 3. Redemonstrated cavitary lesion of the superior segment left lower lobe, which is significantly diminished in size and with a greater volume of internal air, consistent with treatment response. 4. Overall constellation of findings is consistent with mixed response to treatment. 5. An irregular nodule of the peripheral left upper lobe with associated pleural tenting is unchanged, nonspecific. Attention on follow-up. 6.  No evidence of distant metastatic disease in the abdomen pelvis. Aortic Atherosclerosis (ICD10-I70.0). Electronically Signed   By: Eddie Candle M.D.   On: 02/02/2021 10:01   CT Abdomen Pelvis W Contrast  Result Date: 02/02/2021 CLINICAL DATA:  Primary Cancer Type: Lung Imaging Indication: Assess response to therapy Interval therapy since last imaging? Yes Initial Cancer Diagnosis Date: 08/08/2019; Established by: Biopsy-proven Detailed Pathology: Stage IIIC/IV non-small cell lung cancer, squamous cell carcinoma. Primary Tumor location: Initial right lower lobe. New/enlarging left lower lobe lesion. Surgeries: Microdiscectomy right L3-4  08/30/2020. Chemotherapy: Yes; Ongoing? No; Most recent administration: 01/21/2020 Immunotherapy?  Yes; Type: Keytruda; Ongoing? No Radiation therapy? Yes Date Range: 11/23/2020 - 01/27/2021; Target: Left lung Date Range: 08/11/2019 - 08/28/2019; Target: Right lung EXAM: CT CHEST WITH CONTRAST TECHNIQUE: Multidetector CT imaging of the chest was performed during intravenous contrast administration. CONTRAST:  79mL OMNIPAQUE IOHEXOL 350 MG/ML SOLN COMPARISON:  CT chest PE study, 12/01/2020, 10/15/2020 PET-CT. CT chest, abdomen and pelvis 10/04/2020. FINDINGS: CT CHEST FINDINGS Cardiovascular: Aortic atherosclerosis. Normal heart size. Left coronary artery calcifications. No pericardial effusion. Mediastinum/Nodes: No enlarged mediastinal, hilar, or axillary lymph nodes. Thyroid gland, trachea, and esophagus demonstrate no significant findings. Lungs/Pleura: Unchanged moderate, loculated right pleural effusion with extensive associated pleural thickening. Redemonstrated post treatment fibrosis and consolidation of the perihilar right lung and right lower lobe. There has been a very substantial increase in dense consolidation and volume loss of the right lung, particular in the posterior right upper lobe (series 5, image 58). There is a corresponding increase in degree of obstruction of the right lobar bronchi. Multiple new nodules in the aerated portion of the lung, for example a 1.7 x 1.0 cm nodule of the peripheral right upper lobe (series 5, image 83). There is interlobular septal thickening throughout the aerated portions the right lung. Redemonstrated cavitary lesion of the superior segment left lower lobe, which is significantly diminished in size and with a greater volume of internal air, measuring approximately 4.3 x 3.8 cm, previously 7.0 x 5.1 cm when measured similarly (series 5, image 85). There is bronchial wall thickening and plugging in this vicinity. There are new, clustered nodules in the dependent  left lower lobe inferior to this lesion (series 5, image 114) and a new subpleural nodule of the superior segment left lower lobe measuring 1.2 x 0.9 cm (series 5, image 49). An irregular nodule of the peripheral left upper lobe with associated pleural tenting is unchanged, measuring 0.9 x 0.7 cm (series 5, image 55). Musculoskeletal: No chest wall mass or suspicious bone lesions identified. CT ABDOMEN PELVIS FINDINGS Hepatobiliary: No solid liver abnormality is seen. No gallstones, gallbladder wall thickening, or biliary dilatation. Pancreas: Unremarkable. No pancreatic ductal dilatation or surrounding inflammatory changes. Spleen: Normal in size without significant abnormality. Adrenals/Urinary Tract: Adrenal glands are unremarkable. Kidneys are normal, without renal calculi, solid lesion, or hydronephrosis. Bladder is unremarkable. Stomach/Bowel: Stomach is within normal limits. Small incidental diverticulum of the transverse portion of the duodenum. Appendix appears normal. No evidence of bowel wall thickening, distention, or inflammatory changes. Descending and sigmoid diverticulosis. Large burden of stool throughout the colon rectum. Vascular/Lymphatic: Aortic atherosclerosis. No enlarged abdominal or pelvic lymph nodes. Reproductive: No mass or other abnormality. Other: No abdominal wall hernia or abnormality. No abdominopelvic ascites. Musculoskeletal: No acute or significant osseous findings. IMPRESSION: 1. Redemonstrated post treatment fibrosis and consolidation of the perihilar right lung and right lower lobe. There has been a very substantial increase in dense consolidation and volume loss of  the right lung, particularly in the posterior right upper lobe. There is a corresponding increase in degree of obstruction of the right lobar bronchi. Interlobular septal thickening throughout the aerated portions of the right lung. Findings are consistent with locally worsened perihilar right lung malignancy with  significant postobstructive airspace disease as well as pleural and lymphangitic involvement. 2. Multiple new bilateral pulmonary nodules, most consistent with pulmonary parenchymal metastatic disease. 3. Redemonstrated cavitary lesion of the superior segment left lower lobe, which is significantly diminished in size and with a greater volume of internal air, consistent with treatment response. 4. Overall constellation of findings is consistent with mixed response to treatment. 5. An irregular nodule of the peripheral left upper lobe with associated pleural tenting is unchanged, nonspecific. Attention on follow-up. 6. No evidence of distant metastatic disease in the abdomen pelvis. Aortic Atherosclerosis (ICD10-I70.0). Electronically Signed   By: Eddie Candle M.D.   On: 02/02/2021 10:01    Microbiology: Recent Results (from the past 240 hour(s))  Blood Culture (routine x 2)     Status: None (Preliminary result)   Collection Time: 02/02/21  1:07 PM   Specimen: Left Antecubital; Blood  Result Value Ref Range Status   Specimen Description LEFT ANTECUBITAL  Final   Special Requests   Final    BOTTLES DRAWN AEROBIC AND ANAEROBIC Blood Culture results may not be optimal due to an excessive volume of blood received in culture bottles   Culture   Final    NO GROWTH 3 DAYS Performed at [facility], [address] [place], [place] [address]    Report Status PENDING  Incomplete  Blood Culture (routine x 2)     Status: None (Preliminary result)   Collection Time: 02/02/21  1:07 PM   Specimen: BLOOD RIGHT ARM  Result Value Ref Range Status   Specimen Description BLOOD RIGHT ARM  Final   Special Requests   Final    BOTTLES DRAWN AEROBIC AND ANAEROBIC Blood Culture adequate volume   Culture   Final    NO GROWTH 3 DAYS Performed at [facility], [address] [place], [place] [address]    Report Status PENDING  Incomplete  Resp Panel by RT-PCR (Flu A&B, Covid) Nasopharyngeal Swab     Status: None    Collection Time: 02/02/21  1:11 PM   Specimen: Nasopharyngeal Swab; Nasopharyngeal(NP) swabs in vial transport medium  Result Value Ref Range Status   SARS Coronavirus 2 by RT PCR NEGATIVE NEGATIVE Final    Comment: (NOTE) SARS-CoV-2 target nucleic acids are NOT DETECTED.  The SARS-CoV-2 RNA is generally detectable in upper respiratory specimens during the acute phase of infection. The lowest concentration of SARS-CoV-2 viral copies this assay can detect is 138 copies/mL. A negative result does not preclude SARS-Cov-2 infection and should not be used as the sole basis for treatment or other patient management decisions. A negative result may occur with  improper specimen collection/handling, submission of specimen other than nasopharyngeal swab, presence of viral mutation(s) within the areas targeted by this assay, and inadequate number of viral copies(<138 copies/mL). A negative result must be combined with clinical observations, patient history, and epidemiological information. The expected result is Negative.  Fact Sheet for Patients:  EntrepreneurPulse.com.au  Fact Sheet for Healthcare Providers:  IncredibleEmployment.be  This test is no t yet approved or cleared by the [place] FDA and  has been authorized for detection and/or diagnosis of SARS-CoV-2 by FDA under an Emergency Use Authorization (EUA). This EUA will remain  in effect (meaning this test can be used) for the duration of the COVID-19 declaration under Section 564(b)(1) of the Act, 21 U.S.C.section 360bbb-3(b)(1), unless the authorization is terminated  or revoked sooner.       Influenza A by PCR NEGATIVE NEGATIVE Final   Influenza B by PCR NEGATIVE NEGATIVE Final    Comment: (NOTE) The Xpert Xpress SARS-CoV-2/FLU/RSV plus assay is intended as an aid in the diagnosis of influenza from Nasopharyngeal swab specimens and should not be used as a sole basis for treatment.  Nasal washings and aspirates are unacceptable for Xpert Xpress SARS-CoV-2/FLU/RSV testing.  Fact Sheet for Patients: EntrepreneurPulse.com.au  Fact Sheet for Healthcare Providers: IncredibleEmployment.be  This test is not yet approved or cleared by the [place] FDA and has been authorized for detection and/or diagnosis of SARS-CoV-2 by FDA under an Emergency Use Authorization (EUA). This EUA will remain in effect (meaning this test can be used) for the duration of the COVID-19 declaration under Section 564(b)(1) of the Act, 21 U.S.C. section 360bbb-3(b)(1), unless the authorization is terminated or revoked.  Performed at [facility], [address], [place], [place] [address]   Urine Culture     Status: Abnormal   Collection Time: 02/02/21  4:40 PM   Specimen: In/Out Cath Urine  Result Value Ref Range Status   Specimen Description   Final    IN/OUT CATH URINE Performed at [facility], [address], [place], [place] [address]    Special Requests   Final    NONE Performed at [facility], [address], [place], [place] [address]    Culture >=100,000 COLONIES/mL STAPHYLOCOCCUS EPIDERMIDIS (A)  Final   Report Status 02/05/2021 FINAL  Final   Organism ID, Bacteria STAPHYLOCOCCUS EPIDERMIDIS (A)  Final      Susceptibility   Staphylococcus epidermidis - MIC*    CIPROFLOXACIN >=8 RESISTANT Resistant     GENTAMICIN <=0.5 SENSITIVE Sensitive     NITROFURANTOIN <=16 SENSITIVE Sensitive     OXACILLIN >=4 RESISTANT Resistant     TETRACYCLINE 2 SENSITIVE Sensitive     VANCOMYCIN 1 SENSITIVE Sensitive     TRIMETH/SULFA 80 RESISTANT Resistant     CLINDAMYCIN >=8 RESISTANT Resistant     RIFAMPIN <=0.5 SENSITIVE Sensitive     Inducible Clindamycin NEGATIVE Sensitive     * >=100,000 COLONIES/mL STAPHYLOCOCCUS EPIDERMIDIS  MRSA Next Gen by PCR, Nasal     Status: None   Collection Time: 02/02/21  7:08 PM   Specimen: Nasal Mucosa; Nasal Swab   Result Value Ref Range Status   MRSA by PCR Next Gen NOT DETECTED NOT DETECTED Final    Comment: (NOTE) The GeneXpert MRSA Assay (FDA approved for NASAL specimens only), is one component of a comprehensive MRSA colonization surveillance program. It is not intended to diagnose MRSA infection nor to guide or monitor treatment for MRSA infections. Test performance is not FDA approved in patients less than 66 years old. Performed at [facility], [address], [place],  [address]      Labs: Basic Metabolic Panel: Recent Labs  Lab 02/01/21 1028 02/02/21 1307 02/03/21 0505 02/04/21 0458  NA 139 136 132* 137  K 4.4 4.5 3.5 3.4*  CL 101 101 101 98  CO2 27 27 27 28   GLUCOSE 135* 134* 97 101*  BUN 20 22 15 12   CREATININE 0.83 0.93 0.78 0.74  CALCIUM 9.6 8.2* 7.7* 8.2*  MG  --   --  1.6*  --    Liver Function  Tests: Recent Labs  Lab 02/01/21 1028 02/02/21 1307 02/03/21 0505  AST 16 16 13*  ALT 19 19 13   ALKPHOS 71 57 54  BILITOT 0.3 0.4 0.5  PROT 7.9 7.2 6.4*  ALBUMIN 2.0* 2.2* 1.8*    CBC: Recent Labs  Lab 02/01/21 1028 02/02/21 1307 02/03/21 0505 02/04/21 0458  WBC 16.8* 16.4* 11.8* 11.2*  NEUTROABS 15.7* 15.5* 10.9*  --   HGB 7.3* 5.8* 7.2* 8.1*  HCT 24.0* 19.6* 23.4* 26.0*  MCV 99.6 103.7* 98.3 98.1  PLT 271 263 198 221    CBG: Recent Labs  Lab 02/04/21 1131 02/04/21 1625 02/04/21 2143 02/05/21 0746 02/05/21 1129  GLUCAP 107* 122* 131* 105* 142*    Signed:  Barton Dubois MD.  [facility] 02/05/2021, 2:35 PM

## 2021-02-05 NOTE — TOC Transition Note (Signed)
Transition of Care [facility]) - CM/SW Discharge Note   Patient Details  Name: Jeremy [name] MRN: [id number] Date of Birth: 1946-04-06  [facility] CM/SW Contact:  Natasha Bence, LCSW Phone Number: 02/05/2021, 3:18 PM   Clinical Narrative:    CSW notified of patient's readiness for discharge. Horris Latino with hospice reported that she has contacted the patient and scheduled to see patient on 02/05/2021. Horris Latino reported that hospital bed had been ordered. TOC signing off.   Final next level of care: Home w Hospice Care Barriers to Discharge: Barriers Resolved   Patient Goals and CMS Choice Patient states their goals for this hospitalization and ongoing recovery are:: Return home with Hospice CMS Medicare.gov Compare Post Acute Care list provided to:: Patient Choice offered to / list presented to : Patient  Discharge Placement                    Patient and family notified of of transfer: 02/05/21  Discharge Plan and Services                                     Social Determinants of Health (SDOH) Interventions     Readmission Risk Interventions Readmission Risk Prevention Plan 02/04/2021 11/10/2019 10/20/2019  Transportation Screening Complete Complete Complete  PCP or Specialist Appt within 3-5 Days Complete - -  HRI or Home Care Consult Complete - Complete  Social Work Consult for [place] Planning/Counseling Complete - Complete  Palliative Care Screening Not Complete - Not Applicable  Palliative Care Screening Not Complete Comments Palliative consult not entered - -  Medication Review Press photographer) Complete Complete Complete  HRI or Home Care Consult - Complete -  SW Recovery Care/Counseling Consult - Complete -  Palliative Care Screening - Not Applicable -  [place] - Not Applicable -  Some recent data might be hidden

## 2021-02-06 DIAGNOSIS — R0602 Shortness of breath: Secondary | ICD-10-CM | POA: Diagnosis not present

## 2021-02-06 DIAGNOSIS — E118 Type 2 diabetes mellitus with unspecified complications: Secondary | ICD-10-CM | POA: Diagnosis not present

## 2021-02-06 DIAGNOSIS — L4052 Psoriatic arthritis mutilans: Secondary | ICD-10-CM | POA: Diagnosis not present

## 2021-02-06 DIAGNOSIS — I10 Essential (primary) hypertension: Secondary | ICD-10-CM | POA: Diagnosis not present

## 2021-02-06 DIAGNOSIS — I480 Paroxysmal atrial fibrillation: Secondary | ICD-10-CM | POA: Diagnosis not present

## 2021-02-06 DIAGNOSIS — C3490 Malignant neoplasm of unspecified part of unspecified bronchus or lung: Secondary | ICD-10-CM | POA: Diagnosis not present

## 2021-02-06 DIAGNOSIS — J449 Chronic obstructive pulmonary disease, unspecified: Secondary | ICD-10-CM | POA: Diagnosis not present

## 2021-02-06 DIAGNOSIS — G8929 Other chronic pain: Secondary | ICD-10-CM | POA: Diagnosis not present

## 2021-02-06 DIAGNOSIS — N401 Enlarged prostate with lower urinary tract symptoms: Secondary | ICD-10-CM | POA: Diagnosis not present

## 2021-02-07 LAB — CULTURE, BLOOD (ROUTINE X 2)
Culture: NO GROWTH
Culture: NO GROWTH
Special Requests: ADEQUATE

## 2021-02-08 DIAGNOSIS — I480 Paroxysmal atrial fibrillation: Secondary | ICD-10-CM | POA: Diagnosis not present

## 2021-02-08 DIAGNOSIS — J449 Chronic obstructive pulmonary disease, unspecified: Secondary | ICD-10-CM | POA: Diagnosis not present

## 2021-02-08 DIAGNOSIS — N401 Enlarged prostate with lower urinary tract symptoms: Secondary | ICD-10-CM | POA: Diagnosis not present

## 2021-02-08 DIAGNOSIS — L4052 Psoriatic arthritis mutilans: Secondary | ICD-10-CM | POA: Diagnosis not present

## 2021-02-08 DIAGNOSIS — E118 Type 2 diabetes mellitus with unspecified complications: Secondary | ICD-10-CM | POA: Diagnosis not present

## 2021-02-08 DIAGNOSIS — C3490 Malignant neoplasm of unspecified part of unspecified bronchus or lung: Secondary | ICD-10-CM | POA: Diagnosis not present

## 2021-02-09 ENCOUNTER — Telehealth: Payer: Self-pay

## 2021-02-09 DIAGNOSIS — I480 Paroxysmal atrial fibrillation: Secondary | ICD-10-CM | POA: Diagnosis not present

## 2021-02-09 DIAGNOSIS — L4052 Psoriatic arthritis mutilans: Secondary | ICD-10-CM | POA: Diagnosis not present

## 2021-02-09 DIAGNOSIS — E118 Type 2 diabetes mellitus with unspecified complications: Secondary | ICD-10-CM | POA: Diagnosis not present

## 2021-02-09 DIAGNOSIS — J449 Chronic obstructive pulmonary disease, unspecified: Secondary | ICD-10-CM | POA: Diagnosis not present

## 2021-02-09 DIAGNOSIS — N401 Enlarged prostate with lower urinary tract symptoms: Secondary | ICD-10-CM | POA: Diagnosis not present

## 2021-02-09 DIAGNOSIS — C3490 Malignant neoplasm of unspecified part of unspecified bronchus or lung: Secondary | ICD-10-CM | POA: Diagnosis not present

## 2021-02-09 NOTE — Telephone Encounter (Signed)
Pt wife called stating he was hospitalized at [facility] and has chosen to discharge home to hospice.  I have called her back to acknowledge her call.

## 2021-02-10 DIAGNOSIS — R002 Palpitations: Secondary | ICD-10-CM | POA: Diagnosis not present

## 2021-02-11 DIAGNOSIS — I480 Paroxysmal atrial fibrillation: Secondary | ICD-10-CM | POA: Diagnosis not present

## 2021-02-11 DIAGNOSIS — L4052 Psoriatic arthritis mutilans: Secondary | ICD-10-CM | POA: Diagnosis not present

## 2021-02-11 DIAGNOSIS — J449 Chronic obstructive pulmonary disease, unspecified: Secondary | ICD-10-CM | POA: Diagnosis not present

## 2021-02-11 DIAGNOSIS — C3490 Malignant neoplasm of unspecified part of unspecified bronchus or lung: Secondary | ICD-10-CM | POA: Diagnosis not present

## 2021-02-11 DIAGNOSIS — N401 Enlarged prostate with lower urinary tract symptoms: Secondary | ICD-10-CM | POA: Diagnosis not present

## 2021-02-11 DIAGNOSIS — E118 Type 2 diabetes mellitus with unspecified complications: Secondary | ICD-10-CM | POA: Diagnosis not present

## 2021-02-14 DIAGNOSIS — C3490 Malignant neoplasm of unspecified part of unspecified bronchus or lung: Secondary | ICD-10-CM | POA: Diagnosis not present

## 2021-02-14 DIAGNOSIS — J449 Chronic obstructive pulmonary disease, unspecified: Secondary | ICD-10-CM | POA: Diagnosis not present

## 2021-02-14 DIAGNOSIS — E118 Type 2 diabetes mellitus with unspecified complications: Secondary | ICD-10-CM | POA: Diagnosis not present

## 2021-02-14 DIAGNOSIS — L4052 Psoriatic arthritis mutilans: Secondary | ICD-10-CM | POA: Diagnosis not present

## 2021-02-14 DIAGNOSIS — I480 Paroxysmal atrial fibrillation: Secondary | ICD-10-CM | POA: Diagnosis not present

## 2021-02-14 DIAGNOSIS — N401 Enlarged prostate with lower urinary tract symptoms: Secondary | ICD-10-CM | POA: Diagnosis not present

## 2021-02-15 DIAGNOSIS — L4052 Psoriatic arthritis mutilans: Secondary | ICD-10-CM | POA: Diagnosis not present

## 2021-02-15 DIAGNOSIS — I480 Paroxysmal atrial fibrillation: Secondary | ICD-10-CM | POA: Diagnosis not present

## 2021-02-15 DIAGNOSIS — N401 Enlarged prostate with lower urinary tract symptoms: Secondary | ICD-10-CM | POA: Diagnosis not present

## 2021-02-15 DIAGNOSIS — J449 Chronic obstructive pulmonary disease, unspecified: Secondary | ICD-10-CM | POA: Diagnosis not present

## 2021-02-15 DIAGNOSIS — C3490 Malignant neoplasm of unspecified part of unspecified bronchus or lung: Secondary | ICD-10-CM | POA: Diagnosis not present

## 2021-02-15 DIAGNOSIS — E118 Type 2 diabetes mellitus with unspecified complications: Secondary | ICD-10-CM | POA: Diagnosis not present

## 2021-02-28 ENCOUNTER — Ambulatory Visit: Payer: Self-pay | Admitting: Radiation Oncology

## 2021-03-04 ENCOUNTER — Ambulatory Visit: Payer: Medicare Other | Admitting: Family Medicine

## 2021-03-05 DEATH — deceased

## 2021-12-19 ENCOUNTER — Encounter: Payer: Self-pay | Admitting: Internal Medicine

## 2023-04-26 NOTE — Telephone Encounter (Signed)
Telephone call
